# Patient Record
Sex: Female | Born: 1950
Health system: Southern US, Community
[De-identification: ages and names within clinical notes are randomized; demographics above are authoritative.]

## PROBLEM LIST (undated history)

## (undated) DIAGNOSIS — J45909 Unspecified asthma, uncomplicated: Secondary | ICD-10-CM

## (undated) DIAGNOSIS — Z9109 Other allergy status, other than to drugs and biological substances: Secondary | ICD-10-CM

## (undated) DIAGNOSIS — D75839 Thrombocytosis, unspecified: Secondary | ICD-10-CM

## (undated) DIAGNOSIS — E559 Vitamin D deficiency, unspecified: Secondary | ICD-10-CM

## (undated) DIAGNOSIS — R519 Headache, unspecified: Secondary | ICD-10-CM

## (undated) DIAGNOSIS — M1990 Unspecified osteoarthritis, unspecified site: Secondary | ICD-10-CM

## (undated) DIAGNOSIS — I10 Essential (primary) hypertension: Secondary | ICD-10-CM

## (undated) DIAGNOSIS — G4730 Sleep apnea, unspecified: Secondary | ICD-10-CM

## (undated) DIAGNOSIS — K5790 Diverticulosis of intestine, part unspecified, without perforation or abscess without bleeding: Secondary | ICD-10-CM

## (undated) DIAGNOSIS — I509 Heart failure, unspecified: Secondary | ICD-10-CM

## (undated) DIAGNOSIS — I639 Cerebral infarction, unspecified: Secondary | ICD-10-CM

## (undated) DIAGNOSIS — R739 Hyperglycemia, unspecified: Secondary | ICD-10-CM

## (undated) DIAGNOSIS — M810 Age-related osteoporosis without current pathological fracture: Secondary | ICD-10-CM

## (undated) DIAGNOSIS — G2581 Restless legs syndrome: Secondary | ICD-10-CM

## (undated) DIAGNOSIS — E785 Hyperlipidemia, unspecified: Secondary | ICD-10-CM

## (undated) DIAGNOSIS — G709 Myoneural disorder, unspecified: Secondary | ICD-10-CM

## (undated) DIAGNOSIS — D72829 Elevated white blood cell count, unspecified: Secondary | ICD-10-CM

## (undated) DIAGNOSIS — D649 Anemia, unspecified: Secondary | ICD-10-CM

## (undated) DIAGNOSIS — R32 Unspecified urinary incontinence: Secondary | ICD-10-CM

## (undated) DIAGNOSIS — H25019 Cortical age-related cataract, unspecified eye: Secondary | ICD-10-CM

## (undated) DIAGNOSIS — R51 Headache: Secondary | ICD-10-CM

## (undated) DIAGNOSIS — G8929 Other chronic pain: Secondary | ICD-10-CM

## (undated) DIAGNOSIS — N189 Chronic kidney disease, unspecified: Secondary | ICD-10-CM

## (undated) DIAGNOSIS — F419 Anxiety disorder, unspecified: Secondary | ICD-10-CM

## (undated) DIAGNOSIS — E7800 Pure hypercholesterolemia, unspecified: Secondary | ICD-10-CM

## (undated) DIAGNOSIS — I209 Angina pectoris, unspecified: Secondary | ICD-10-CM

## (undated) DIAGNOSIS — D473 Essential (hemorrhagic) thrombocythemia: Secondary | ICD-10-CM

## (undated) DIAGNOSIS — E669 Obesity, unspecified: Secondary | ICD-10-CM

## (undated) DIAGNOSIS — Z972 Presence of dental prosthetic device (complete) (partial): Secondary | ICD-10-CM

## (undated) DIAGNOSIS — R011 Cardiac murmur, unspecified: Secondary | ICD-10-CM

## (undated) DIAGNOSIS — R002 Palpitations: Secondary | ICD-10-CM

## (undated) DIAGNOSIS — T7840XA Allergy, unspecified, initial encounter: Secondary | ICD-10-CM

## (undated) DIAGNOSIS — F329 Major depressive disorder, single episode, unspecified: Secondary | ICD-10-CM

## (undated) DIAGNOSIS — I872 Venous insufficiency (chronic) (peripheral): Secondary | ICD-10-CM

## (undated) DIAGNOSIS — I5030 Unspecified diastolic (congestive) heart failure: Secondary | ICD-10-CM

## (undated) DIAGNOSIS — K219 Gastro-esophageal reflux disease without esophagitis: Secondary | ICD-10-CM

## (undated) HISTORY — DX: Chronic kidney disease, unspecified: N18.9

## (undated) HISTORY — DX: Other allergy status, other than to drugs and biological substances: Z91.09

## (undated) HISTORY — DX: Cardiac murmur, unspecified: R01.1

## (undated) HISTORY — DX: Elevated white blood cell count, unspecified: D72.829

## (undated) HISTORY — PX: BLADDER SURGERY: SHX569

## (undated) HISTORY — PX: OTHER SURGICAL HISTORY: SHX169

## (undated) HISTORY — PX: FLEXIBLE SIGMOIDOSCOPY: SHX1649

## (undated) HISTORY — DX: Thrombocytosis, unspecified: D75.839

## (undated) HISTORY — DX: Diverticulosis of intestine, part unspecified, without perforation or abscess without bleeding: K57.90

## (undated) HISTORY — PX: COLON SURGERY: SHX602

## (undated) HISTORY — DX: Essential (primary) hypertension: I10

## (undated) HISTORY — DX: Anemia, unspecified: D64.9

## (undated) HISTORY — DX: Allergy, unspecified, initial encounter: T78.40XA

## (undated) HISTORY — DX: Age-related osteoporosis without current pathological fracture: M81.0

## (undated) HISTORY — DX: Palpitations: R00.2

## (undated) HISTORY — PX: JOINT REPLACEMENT: SHX530

## (undated) HISTORY — PX: ROTATOR CUFF REPAIR: SHX139

## (undated) HISTORY — DX: Restless legs syndrome: G25.81

## (undated) HISTORY — PX: CARDIAC CATHETERIZATION: SHX172

## (undated) HISTORY — DX: Gastro-esophageal reflux disease without esophagitis: K21.9

## (undated) HISTORY — PX: RECTOCELE REPAIR: SHX761

## (undated) HISTORY — DX: Hyperglycemia, unspecified: R73.9

## (undated) HISTORY — DX: Headache, unspecified: R51.9

## (undated) HISTORY — DX: Essential (hemorrhagic) thrombocythemia: D47.3

## (undated) HISTORY — DX: Unspecified diastolic (congestive) heart failure: I50.30

## (undated) HISTORY — DX: Other chronic pain: G89.29

## (undated) HISTORY — DX: Major depressive disorder, single episode, unspecified: F32.9

## (undated) HISTORY — PX: EYE SURGERY: SHX253

## (undated) HISTORY — DX: Unspecified osteoarthritis, unspecified site: M19.90

## (undated) HISTORY — DX: Pure hypercholesterolemia, unspecified: E78.00

## (undated) HISTORY — DX: Myoneural disorder, unspecified: G70.9

## (undated) HISTORY — DX: Unspecified urinary incontinence: R32

## (undated) HISTORY — DX: Headache: R51

## (undated) HISTORY — DX: Unspecified asthma, uncomplicated: J45.909

## (undated) HISTORY — PX: CHOLECYSTECTOMY: SHX55

## (undated) HISTORY — PX: CERVICAL CONE BIOPSY: SUR198

---
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
## 1972-08-16 HISTORY — PX: VAGINAL HYSTERECTOMY: SUR661

## 2003-08-17 HISTORY — PX: BREAST CYST ASPIRATION: SHX578

## 2004-09-20 ENCOUNTER — Emergency Department: Payer: Self-pay | Admitting: Emergency Medicine

## 2004-09-29 ENCOUNTER — Ambulatory Visit: Payer: Self-pay | Admitting: Unknown Physician Specialty

## 2004-10-15 ENCOUNTER — Ambulatory Visit: Payer: Self-pay | Admitting: Internal Medicine

## 2004-11-19 ENCOUNTER — Ambulatory Visit: Payer: Self-pay | Admitting: Internal Medicine

## 2004-11-26 ENCOUNTER — Ambulatory Visit: Payer: Self-pay | Admitting: Orthopaedic Surgery

## 2005-01-14 ENCOUNTER — Ambulatory Visit: Payer: Self-pay | Admitting: Internal Medicine

## 2005-04-27 ENCOUNTER — Ambulatory Visit: Payer: Self-pay | Admitting: Internal Medicine

## 2005-05-13 ENCOUNTER — Ambulatory Visit: Payer: Self-pay | Admitting: Internal Medicine

## 2005-09-18 ENCOUNTER — Ambulatory Visit: Payer: Self-pay | Admitting: Orthopaedic Surgery

## 2005-10-20 ENCOUNTER — Ambulatory Visit: Payer: Self-pay | Admitting: Unknown Physician Specialty

## 2005-11-10 ENCOUNTER — Other Ambulatory Visit: Payer: Self-pay

## 2005-11-17 ENCOUNTER — Ambulatory Visit: Payer: Self-pay | Admitting: Orthopaedic Surgery

## 2005-11-17 HISTORY — PX: SHOULDER SURGERY: SHX246

## 2005-12-08 ENCOUNTER — Ambulatory Visit: Payer: Self-pay | Admitting: Unknown Physician Specialty

## 2006-04-28 ENCOUNTER — Ambulatory Visit: Payer: Self-pay | Admitting: Internal Medicine

## 2006-09-20 ENCOUNTER — Encounter: Payer: Self-pay | Admitting: Psychiatry

## 2006-10-15 ENCOUNTER — Encounter: Payer: Self-pay | Admitting: Psychiatry

## 2007-05-18 ENCOUNTER — Ambulatory Visit: Payer: Self-pay | Admitting: Internal Medicine

## 2007-09-14 ENCOUNTER — Ambulatory Visit: Payer: Self-pay | Admitting: Internal Medicine

## 2007-10-17 ENCOUNTER — Encounter: Admission: RE | Admit: 2007-10-17 | Discharge: 2007-10-17 | Payer: Self-pay | Admitting: Specialist

## 2008-02-02 ENCOUNTER — Ambulatory Visit: Payer: Self-pay | Admitting: Orthopedic Surgery

## 2008-02-05 ENCOUNTER — Inpatient Hospital Stay: Payer: Self-pay | Admitting: Orthopedic Surgery

## 2008-03-19 ENCOUNTER — Ambulatory Visit: Payer: Self-pay | Admitting: Orthopedic Surgery

## 2008-04-30 ENCOUNTER — Emergency Department: Payer: Self-pay | Admitting: Emergency Medicine

## 2008-05-01 ENCOUNTER — Ambulatory Visit: Payer: Self-pay | Admitting: Internal Medicine

## 2008-05-20 ENCOUNTER — Ambulatory Visit: Payer: Self-pay | Admitting: General Surgery

## 2008-05-20 ENCOUNTER — Ambulatory Visit: Payer: Self-pay | Admitting: Internal Medicine

## 2008-05-23 ENCOUNTER — Ambulatory Visit: Payer: Self-pay | Admitting: General Surgery

## 2008-08-08 ENCOUNTER — Ambulatory Visit: Payer: Self-pay | Admitting: Internal Medicine

## 2008-08-08 ENCOUNTER — Ambulatory Visit: Payer: Self-pay | Admitting: Orthopedic Surgery

## 2008-08-13 ENCOUNTER — Ambulatory Visit: Payer: Self-pay | Admitting: Orthopedic Surgery

## 2008-08-13 HISTORY — PX: KNEE ARTHROSCOPY: SUR90

## 2008-10-28 ENCOUNTER — Ambulatory Visit: Payer: Self-pay | Admitting: Orthopedic Surgery

## 2009-01-03 ENCOUNTER — Ambulatory Visit: Payer: Self-pay | Admitting: Psychiatry

## 2009-01-13 ENCOUNTER — Ambulatory Visit: Payer: Self-pay | Admitting: Internal Medicine

## 2009-01-27 ENCOUNTER — Ambulatory Visit: Payer: Self-pay | Admitting: Gastroenterology

## 2009-02-04 ENCOUNTER — Ambulatory Visit: Payer: Self-pay | Admitting: Orthopedic Surgery

## 2009-02-10 ENCOUNTER — Inpatient Hospital Stay: Payer: Self-pay | Admitting: Orthopedic Surgery

## 2009-02-24 ENCOUNTER — Ambulatory Visit: Payer: Self-pay | Admitting: Gastroenterology

## 2009-03-26 ENCOUNTER — Ambulatory Visit: Payer: Self-pay | Admitting: Internal Medicine

## 2009-04-16 ENCOUNTER — Ambulatory Visit: Payer: Self-pay

## 2009-04-16 ENCOUNTER — Ambulatory Visit: Payer: Self-pay | Admitting: Internal Medicine

## 2009-05-21 ENCOUNTER — Ambulatory Visit: Payer: Self-pay | Admitting: Internal Medicine

## 2009-06-16 ENCOUNTER — Ambulatory Visit: Payer: Self-pay | Admitting: Internal Medicine

## 2009-07-16 ENCOUNTER — Ambulatory Visit: Payer: Self-pay | Admitting: Internal Medicine

## 2009-08-16 ENCOUNTER — Ambulatory Visit: Payer: Self-pay | Admitting: Internal Medicine

## 2009-09-16 ENCOUNTER — Ambulatory Visit: Payer: Self-pay | Admitting: Internal Medicine

## 2010-06-10 ENCOUNTER — Ambulatory Visit: Payer: Self-pay | Admitting: Internal Medicine

## 2010-07-20 ENCOUNTER — Ambulatory Visit: Payer: Self-pay

## 2010-12-19 ENCOUNTER — Ambulatory Visit: Payer: Self-pay

## 2010-12-31 ENCOUNTER — Ambulatory Visit: Payer: Self-pay | Admitting: Orthopedic Surgery

## 2011-04-20 ENCOUNTER — Ambulatory Visit: Payer: Self-pay | Admitting: Internal Medicine

## 2011-06-28 ENCOUNTER — Ambulatory Visit: Payer: Self-pay | Admitting: Internal Medicine

## 2011-08-02 ENCOUNTER — Ambulatory Visit: Payer: Self-pay | Admitting: Rheumatology

## 2011-10-26 ENCOUNTER — Ambulatory Visit: Payer: Self-pay | Admitting: Gastroenterology

## 2011-12-07 ENCOUNTER — Ambulatory Visit: Payer: Self-pay | Admitting: Gastroenterology

## 2011-12-09 LAB — PATHOLOGY REPORT

## 2012-02-14 ENCOUNTER — Ambulatory Visit: Payer: Self-pay | Admitting: Internal Medicine

## 2012-05-06 ENCOUNTER — Ambulatory Visit: Payer: Self-pay | Admitting: Physical Medicine and Rehabilitation

## 2012-08-21 ENCOUNTER — Encounter: Payer: Self-pay | Admitting: Internal Medicine

## 2012-08-21 ENCOUNTER — Ambulatory Visit (INDEPENDENT_AMBULATORY_CARE_PROVIDER_SITE_OTHER): Payer: BC Managed Care – PPO | Admitting: Internal Medicine

## 2012-08-21 VITALS — BP 116/70 | HR 68 | Temp 98.0°F | Ht 65.0 in | Wt 232.0 lb

## 2012-08-21 DIAGNOSIS — I10 Essential (primary) hypertension: Secondary | ICD-10-CM

## 2012-08-21 DIAGNOSIS — E782 Mixed hyperlipidemia: Secondary | ICD-10-CM | POA: Insufficient documentation

## 2012-08-21 DIAGNOSIS — R739 Hyperglycemia, unspecified: Secondary | ICD-10-CM | POA: Insufficient documentation

## 2012-08-21 DIAGNOSIS — I872 Venous insufficiency (chronic) (peripheral): Secondary | ICD-10-CM | POA: Insufficient documentation

## 2012-08-21 DIAGNOSIS — M1990 Unspecified osteoarthritis, unspecified site: Secondary | ICD-10-CM | POA: Insufficient documentation

## 2012-08-21 DIAGNOSIS — D473 Essential (hemorrhagic) thrombocythemia: Secondary | ICD-10-CM

## 2012-08-21 DIAGNOSIS — E7800 Pure hypercholesterolemia, unspecified: Secondary | ICD-10-CM

## 2012-08-21 DIAGNOSIS — R002 Palpitations: Secondary | ICD-10-CM

## 2012-08-21 DIAGNOSIS — D649 Anemia, unspecified: Secondary | ICD-10-CM

## 2012-08-21 DIAGNOSIS — K219 Gastro-esophageal reflux disease without esophagitis: Secondary | ICD-10-CM

## 2012-08-21 DIAGNOSIS — R51 Headache: Secondary | ICD-10-CM

## 2012-08-21 DIAGNOSIS — R7309 Other abnormal glucose: Secondary | ICD-10-CM

## 2012-08-21 MED ORDER — CITALOPRAM HYDROBROMIDE 20 MG PO TABS: ORAL_TABLET | ORAL | Status: DC

## 2012-08-21 MED ORDER — ESOMEPRAZOLE MAGNESIUM 40 MG PO CPDR: 40.0000 mg | DELAYED_RELEASE_CAPSULE | Freq: Every day | ORAL | Status: DC

## 2012-08-27 ENCOUNTER — Encounter: Payer: Self-pay | Admitting: Internal Medicine

## 2012-08-27 NOTE — Assessment & Plan Note (Signed)
On topamax.  Headaches better.  Has declined a follow up appt with Dr Neale Burly.

## 2012-08-27 NOTE — Assessment & Plan Note (Signed)
Saw Dr Pandit.  Follow cbc.  

## 2012-08-27 NOTE — Assessment & Plan Note (Signed)
Increased reflux as outlined.  Could be contributing to the above.  Will change back to nexium.  Refer to GI for evaluation of persistent symptoms despite medication.

## 2012-08-27 NOTE — Progress Notes (Signed)
Subjective:    Patient ID: Nicole Sims, female    DOB: 11-30-1950, 62 y.o.   MRN: [id number]  HPI 62 year old female with past history of depression requiring hospitalization x 2.  She also has known reflux, IBS, tachycardia, hypercholesterolemia and headaches.  She comes in today for a scheduled follow up.  She states she saw Dr Hyacinth Meeker on 03/20/12 for increased heart rate and elevated blood pressure.  Metoprolol was restarted.  She still notices some increased heart rate at times.  Has not seen cardiology recently.  No chest pain.  Breathing stable.  Seeing Dr Yves Dill.  Had spinal injections.  Off Cymbalta.  Taking oxycodone prn.  States she may average 2x/week.  Dr Gavin Potters referred her to Dr Yves Dill.  Has seen Dr Sherrlyn Hock also.  Did not tolerate the iron.  She is on Protonix now.  Wants to change back to Nexium.  Feels the Nexium worked better for her.  With increased acid reflux.  Not taking welchol.  Ran out.    Past Medical History  Diagnosis Date  . Hypertension   . Hypercholesterolemia   . Palpitations   . Depression   . GERD (gastroesophageal reflux disease)   . Environmental allergies   . Urinary incontinence     mixed  . Osteoarthritis   . Diverticulosis   . Anemia   . Thrombocytosis   . Chronic headaches   . Hyperglycemia   . Asthma     Current Outpatient Prescriptions on File Prior to Visit  Medication Sig Dispense Refill  . albuterol (PROVENTIL HFA;VENTOLIN HFA) 108 (90 BASE) MCG/ACT inhaler Inhale 2 puffs into the lungs every 4 (four) hours as needed.      . calcium gluconate 500 MG tablet Take 500 mg by mouth 2 (two) times daily.      . fluticasone (FLONASE) 50 MCG/ACT nasal spray Place 2 sprays into the nose as needed.       . Fluticasone-Salmeterol (ADVAIR) 250-50 MCG/DOSE AEPB Inhale 1 puff into the lungs every 12 (twelve) hours.      . metoprolol succinate (TOPROL-XL) 25 MG 24 hr tablet Take 25 mg by mouth daily.      Marland Kitchen esomeprazole (NEXIUM) 40 MG capsule Take 1  capsule (40 mg total) by mouth daily before breakfast.  30 capsule  3    Review of Systems Patient denies any headache, lightheadedness or dizziness.  No significant sinus or allergy symptoms now.  No chest pain, tightness, but does report some increased heart rate at times.  taking the Metoprolol.   No increased shortness of breath, cough or congestion.  No nausea or vomiting.  Increased acid reflux as outlined.   No abdominal pain or cramping.  No bowel change, such as diarrhea, constipation, BRBPR or melana.  No urine change.        Objective:   Physical Exam Filed Vitals:   08/21/12 1544  BP: 116/70  Pulse: 68  Temp: 98 F (36.7 C)   Blood pressure recheck:  132.82, pulse 59  62 year old female in no acute distress.   HEENT:  Nares - clear.  OP- without lesions or erythema.  NECK:  Supple, nontender.  No audible bruit.   HEART:  Appears to be regular.  Rate controlled.   LUNGS:  Without crackles or wheezing audible.  Respirations even and unlabored.   RADIAL PULSE:  Equal bilaterally.  ABDOMEN:  Soft, nontender.  No audible abdominal bruit.   EXTREMITIES:  No increased edema  to be present.                  Assessment & Plan:  PULMONARY.  Breathing stable.  Follow.    INCREASED PSYCHOSOCIAL STRESSORS.  On celexa.  Relatively stable.  Hold on making any changes.  Follow.    HEALTH MAINTENANCE.  Will schedule physical at next visit.  Last 09/06/11.  Colonoscopy 01/27/09 - diverticulosis otherwise normal.  Obtain results of last mammogram.

## 2012-08-27 NOTE — Assessment & Plan Note (Signed)
Low carb diet, exercise and weight loss.  Follow.  Check metabolic panel and a1c.

## 2012-08-27 NOTE — Assessment & Plan Note (Signed)
Was evaluated by Dr Sherrlyn Hock.  Spleen ok.  Recheck cbc with next labs.

## 2012-08-27 NOTE — Assessment & Plan Note (Signed)
Blood pressure appears to be under control.  Same medication.  Follow.

## 2012-08-27 NOTE — Assessment & Plan Note (Signed)
Low cholesterol diet and exercise.  Does not tolerate statins.  Will hold on Welchol until GI issues straightened out.  Check lipid panel.

## 2012-08-27 NOTE — Assessment & Plan Note (Signed)
Has noticed increased heart rate intermittently.  EKG obtained and revealed SR with no acute ischemic changes.  Follow.  Continue metoprolol.  Treat acid reflux.  Follow.

## 2012-08-28 ENCOUNTER — Other Ambulatory Visit (INDEPENDENT_AMBULATORY_CARE_PROVIDER_SITE_OTHER): Payer: BC Managed Care – PPO

## 2012-08-28 DIAGNOSIS — D473 Essential (hemorrhagic) thrombocythemia: Secondary | ICD-10-CM

## 2012-08-28 DIAGNOSIS — R739 Hyperglycemia, unspecified: Secondary | ICD-10-CM

## 2012-08-28 DIAGNOSIS — D649 Anemia, unspecified: Secondary | ICD-10-CM

## 2012-08-28 DIAGNOSIS — I10 Essential (primary) hypertension: Secondary | ICD-10-CM

## 2012-08-28 DIAGNOSIS — R002 Palpitations: Secondary | ICD-10-CM

## 2012-08-28 DIAGNOSIS — R7309 Other abnormal glucose: Secondary | ICD-10-CM

## 2012-08-28 DIAGNOSIS — E7800 Pure hypercholesterolemia, unspecified: Secondary | ICD-10-CM

## 2012-08-28 LAB — CBC WITH DIFFERENTIAL/PLATELET
Basophils Relative: 0.5 % (ref 0.0–3.0)
Eosinophils Absolute: 0.1 10*3/uL (ref 0.0–0.7)
Eosinophils Relative: 1.7 % (ref 0.0–5.0)
HCT: 40.5 % (ref 36.0–46.0)
Lymphs Abs: 1.9 10*3/uL (ref 0.7–4.0)
MCHC: 32.4 g/dL (ref 30.0–36.0)
MCV: 86 fl (ref 78.0–100.0)
Monocytes Absolute: 0.6 10*3/uL (ref 0.1–1.0)
Neutro Abs: 3.3 10*3/uL (ref 1.4–7.7)
RBC: 4.71 Mil/uL (ref 3.87–5.11)
WBC: 5.9 10*3/uL (ref 4.5–10.5)

## 2012-08-28 LAB — LIPID PANEL
Cholesterol: 198 mg/dL (ref 0–200)
VLDL: 21.2 mg/dL (ref 0.0–40.0)

## 2012-08-28 LAB — HEPATIC FUNCTION PANEL
ALT: 24 U/L (ref 0–35)
Alkaline Phosphatase: 78 U/L (ref 39–117)
Bilirubin, Direct: 0 mg/dL (ref 0.0–0.3)
Total Bilirubin: 0.5 mg/dL (ref 0.3–1.2)

## 2012-08-28 LAB — BASIC METABOLIC PANEL
BUN: 8 mg/dL (ref 6–23)
Calcium: 9.1 mg/dL (ref 8.4–10.5)
Creatinine, Ser: 0.9 mg/dL (ref 0.4–1.2)
GFR: 68.23 mL/min (ref >60.00)
Glucose, Bld: 96 mg/dL (ref 70–99)

## 2012-08-28 LAB — TSH: TSH: 1.16 u[IU]/mL (ref 0.35–5.50)

## 2012-08-29 ENCOUNTER — Telehealth: Payer: Self-pay | Admitting: Internal Medicine

## 2012-08-29 DIAGNOSIS — D473 Essential (hemorrhagic) thrombocythemia: Secondary | ICD-10-CM

## 2012-08-29 DIAGNOSIS — I10 Essential (primary) hypertension: Secondary | ICD-10-CM

## 2012-08-29 DIAGNOSIS — R739 Hyperglycemia, unspecified: Secondary | ICD-10-CM

## 2012-08-29 DIAGNOSIS — E7800 Pure hypercholesterolemia, unspecified: Secondary | ICD-10-CM

## 2012-08-29 NOTE — Telephone Encounter (Signed)
Scheduled

## 2012-08-29 NOTE — Telephone Encounter (Signed)
Pt notified of lab results via my chart.  Needs follow up fasting labs 1-2 days prior to her 11/27/12 appt.  Please call and schedule.  Thanks.

## 2012-09-30 ENCOUNTER — Other Ambulatory Visit: Payer: Self-pay

## 2012-10-14 ENCOUNTER — Other Ambulatory Visit: Payer: Self-pay | Admitting: Internal Medicine

## 2012-11-16 ENCOUNTER — Encounter: Payer: Self-pay | Admitting: *Deleted

## 2012-11-17 ENCOUNTER — Telehealth: Payer: Self-pay | Admitting: *Deleted

## 2012-11-17 MED ORDER — METOPROLOL SUCCINATE ER 25 MG PO TB24: ORAL_TABLET | ORAL | Status: DC

## 2012-11-17 NOTE — Telephone Encounter (Signed)
Pharmacy called with rx request

## 2012-11-20 ENCOUNTER — Other Ambulatory Visit (INDEPENDENT_AMBULATORY_CARE_PROVIDER_SITE_OTHER): Payer: BC Managed Care – PPO

## 2012-11-20 DIAGNOSIS — R739 Hyperglycemia, unspecified: Secondary | ICD-10-CM

## 2012-11-20 DIAGNOSIS — I10 Essential (primary) hypertension: Secondary | ICD-10-CM

## 2012-11-20 DIAGNOSIS — D473 Essential (hemorrhagic) thrombocythemia: Secondary | ICD-10-CM

## 2012-11-20 DIAGNOSIS — E7800 Pure hypercholesterolemia, unspecified: Secondary | ICD-10-CM

## 2012-11-20 DIAGNOSIS — R7309 Other abnormal glucose: Secondary | ICD-10-CM

## 2012-11-20 LAB — CBC WITH DIFFERENTIAL/PLATELET
Basophils Absolute: 0.1 10*3/uL (ref 0.0–0.1)
Hemoglobin: 13 g/dL (ref 12.0–15.0)
Lymphocytes Relative: 28.2 % (ref 12.0–46.0)
Monocytes Relative: 7.3 % (ref 3.0–12.0)
Neutro Abs: 5.3 10*3/uL (ref 1.4–7.7)
Neutrophils Relative %: 62 % (ref 43.0–77.0)
RDW: 14.1 % (ref 11.5–14.6)

## 2012-11-20 LAB — LIPID PANEL
Cholesterol: 239 mg/dL — ABNORMAL HIGH (ref 0–200)
HDL: 43.5 mg/dL (ref >39.00)
Triglycerides: 119 mg/dL (ref 0.0–149.0)
VLDL: 23.8 mg/dL (ref 0.0–40.0)

## 2012-11-20 LAB — BASIC METABOLIC PANEL
Calcium: 9.3 mg/dL (ref 8.4–10.5)
Creatinine, Ser: 1 mg/dL (ref 0.4–1.2)
GFR: 59.6 mL/min — ABNORMAL LOW (ref >60.00)
Glucose, Bld: 100 mg/dL — ABNORMAL HIGH (ref 70–99)
Sodium: 141 mEq/L (ref 135–145)

## 2012-11-20 LAB — LDL CHOLESTEROL, DIRECT: Direct LDL: 164.5 mg/dL

## 2012-11-21 ENCOUNTER — Telehealth: Payer: Self-pay | Admitting: Internal Medicine

## 2012-11-21 NOTE — Telephone Encounter (Signed)
Notified of labs via my chart 

## 2012-11-27 ENCOUNTER — Other Ambulatory Visit (HOSPITAL_COMMUNITY)
Admission: RE | Admit: 2012-11-27 | Discharge: 2012-11-27 | Disposition: A | Discharge status: Home / Self Care | Payer: BC Managed Care – PPO | Source: Ambulatory Visit | Attending: Internal Medicine | Admitting: Internal Medicine

## 2012-11-27 ENCOUNTER — Encounter: Payer: Self-pay | Admitting: Internal Medicine

## 2012-11-27 ENCOUNTER — Ambulatory Visit (INDEPENDENT_AMBULATORY_CARE_PROVIDER_SITE_OTHER): Payer: BC Managed Care – PPO | Admitting: Internal Medicine

## 2012-11-27 VITALS — BP 120/80 | HR 86 | Temp 98.5°F | Ht 64.0 in | Wt 231.8 lb

## 2012-11-27 DIAGNOSIS — E7800 Pure hypercholesterolemia, unspecified: Secondary | ICD-10-CM

## 2012-11-27 DIAGNOSIS — D473 Essential (hemorrhagic) thrombocythemia: Secondary | ICD-10-CM

## 2012-11-27 DIAGNOSIS — M1990 Unspecified osteoarthritis, unspecified site: Secondary | ICD-10-CM

## 2012-11-27 DIAGNOSIS — R7309 Other abnormal glucose: Secondary | ICD-10-CM

## 2012-11-27 DIAGNOSIS — Z124 Encounter for screening for malignant neoplasm of cervix: Secondary | ICD-10-CM

## 2012-11-27 DIAGNOSIS — Z1151 Encounter for screening for human papillomavirus (HPV): Secondary | ICD-10-CM | POA: Insufficient documentation

## 2012-11-27 DIAGNOSIS — D649 Anemia, unspecified: Secondary | ICD-10-CM

## 2012-11-27 DIAGNOSIS — Z1239 Encounter for other screening for malignant neoplasm of breast: Secondary | ICD-10-CM

## 2012-11-27 DIAGNOSIS — R002 Palpitations: Secondary | ICD-10-CM

## 2012-11-27 DIAGNOSIS — Z01419 Encounter for gynecological examination (general) (routine) without abnormal findings: Secondary | ICD-10-CM | POA: Insufficient documentation

## 2012-11-27 DIAGNOSIS — I10 Essential (primary) hypertension: Secondary | ICD-10-CM

## 2012-11-27 DIAGNOSIS — K219 Gastro-esophageal reflux disease without esophagitis: Secondary | ICD-10-CM

## 2012-11-27 DIAGNOSIS — R51 Headache: Secondary | ICD-10-CM

## 2012-11-27 DIAGNOSIS — R739 Hyperglycemia, unspecified: Secondary | ICD-10-CM

## 2012-11-27 MED ORDER — FLUTICASONE-SALMETEROL 250-50 MCG/DOSE IN AEPB: 1.0000 | INHALATION_SPRAY | Freq: Two times a day (BID) | RESPIRATORY_TRACT | Status: DC

## 2012-11-27 MED ORDER — ESOMEPRAZOLE MAGNESIUM 40 MG PO CPDR: 40.0000 mg | DELAYED_RELEASE_CAPSULE | Freq: Every day | ORAL | Status: DC

## 2012-11-28 ENCOUNTER — Encounter: Payer: Self-pay | Admitting: Internal Medicine

## 2012-11-28 NOTE — Assessment & Plan Note (Addendum)
Saw Dr Pandit.  Follow cbc.  Recent hgb normal.   

## 2012-11-28 NOTE — Assessment & Plan Note (Signed)
Low cholesterol diet and exercise.  Does not tolerate statins.  Will restart Welchol.  Follow lipid panel.

## 2012-11-28 NOTE — Assessment & Plan Note (Signed)
Low carb diet, exercise and weight loss.  Follow.  Follow metabolic panel and a1c.  Last a1c normal.

## 2012-11-28 NOTE — Assessment & Plan Note (Signed)
On topamax.  Headaches better.  Has declined a follow up appt with Dr Neale Burly.

## 2012-11-28 NOTE — Progress Notes (Signed)
Subjective:    Patient ID: Nicole Sims, female    DOB: 05/29/1951, 62 y.o.   MRN: [id number]  Urinary Frequency  Associated symptoms include frequency.  62 year old female with past history of depression requiring hospitalization x 2.  She also has known reflux, IBS, tachycardia, hypercholesterolemia and headaches.  She comes in today to follow up on these issues as well as for a complete physical exam.  She reports that for two weeks she has had some increased congestion and cough.  Right side nose congested.  Increased drainage.  Neck hurting.  Increased cough - productive of green mucus.  Has started CF tussin and Flonase.  Not using Advair.  Has noticed some wheezing.  Her reflux is better.  She is not taking Nexium in the am and Zantac in the pm.  She is also trying to avoid foods that aggravate.  Discussed increased cholesterol.  Intolerant of statins.  Thought Wellchol caused some nausea.  Willing to retry.  Bowels stable.  No increased abdominal pain.      Past Medical History  Diagnosis Date  . Hypertension   . Hypercholesterolemia   . Palpitations   . Depression   . GERD (gastroesophageal reflux disease)   . Environmental allergies   . Urinary incontinence     mixed  . Osteoarthritis   . Diverticulosis   . Anemia   . Thrombocytosis   . Chronic headaches   . Hyperglycemia   . Asthma     Current Outpatient Prescriptions on File Prior to Visit  Medication Sig Dispense Refill  . albuterol (PROVENTIL HFA;VENTOLIN HFA) 108 (90 BASE) MCG/ACT inhaler Inhale 2 puffs into the lungs every 4 (four) hours as needed.      Marland Kitchen aspirin 81 MG tablet Take 81 mg by mouth daily.      . calcium gluconate 500 MG tablet Take 500 mg by mouth 2 (two) times daily.      . citalopram (CELEXA) 20 MG tablet Take 1 1/2 tablet by mouth daily  45 tablet  3  . cyclobenzaprine (FLEXERIL) 10 MG tablet Take 10 mg by mouth 3 (three) times daily as needed.      . fish oil-omega-3 fatty acids 1000 MG capsule  Take 1 g by mouth 2 (two) times daily.      . fluticasone (FLONASE) 50 MCG/ACT nasal spray Place 2 sprays into the nose as needed.       . metoprolol succinate (TOPROL-XL) 25 MG 24 hr tablet TAKE 1 TABLET BY MOUTH EVERY DAY  30 tablet  0  . Multiple Vitamin (MULTIVITAMIN) tablet Take 1 tablet by mouth daily.      Marland Kitchen oxyCODONE (OXY IR/ROXICODONE) 5 MG immediate release tablet Take 5 mg by mouth 2 (two) times daily as needed.       No current facility-administered medications on file prior to visit.    Review of Systems  Genitourinary: Positive for frequency.  Patient denies any headache, lightheadedness or dizziness.  Reports some increased nasal congestion and cough as outlined.  No chest pain, tightness.  No increased palpitations reported.  Taking the Metoprolol. No increased shortness of breath.  Does report some wheezing.   No nausea or vomiting.  Acid reflux improved.  No abdominal pain or cramping.  No bowel change, such as diarrhea, constipation, BRBPR or melana.  No significant change in her urine reported to me.       Objective:   Physical Exam  Filed Vitals:   11/27/12  1321  BP: 120/80  Pulse: 86  Temp: 98.5 F (36.9 C)   Blood pressure recheck:  128/80, pulse 4  62 year old female in no acute distress.   HEENT:  Nares- clear.  Oropharynx - without lesions. NECK:  Supple.  Nontender.  No audible bruit.  HEART:  Appears to be regular. LUNGS:  No crackles or wheezing audible.  Respirations even and unlabored.  RADIAL PULSE:  Equal bilaterally.    BREASTS:  No nipple discharge or nipple retraction present.  Could not appreciate any distinct nodules or axillary adenopathy.  ABDOMEN:  Soft, nontender.  Bowel sounds present and normal.  No audible abdominal bruit.  GU:  Normal external genitalia.  Vaginal vault without lesions.  S/P hysterectomy.  Pap performed. Could not appreciate any adnexal masses or tenderness.   RECTAL:  Heme negative.   EXTREMITIES:  No increased edema  present.  DP pulses palpable and equal bilaterally.          Assessment & Plan:  PULMONARY.  Increased sinus congestion and cough.  Some wheezing.  Persistent.  Will try Mucinex and Advair as directed.  Flonase and saline nasal spray as directed.  If symptoms do no improve or if they worsen, rx for Levaquin given to pt to take if needed.  Notify me if persistent problems.      INCREASED PSYCHOSOCIAL STRESSORS.  On celexa.  Relatively stable.  Hold on making any changes.  Follow.    HEALTH MAINTENANCE.    Physical today.  Colonoscopy 01/27/09 - diverticulosis otherwise normal.  Overdue mammogram.  Schedule.

## 2012-11-28 NOTE — Assessment & Plan Note (Signed)
On metoprolol. Stable.  

## 2012-11-28 NOTE — Assessment & Plan Note (Signed)
Was evaluated by Dr Pandit.  Spleen ok. Follow platelet count.   

## 2012-11-28 NOTE — Assessment & Plan Note (Signed)
Doing better on Nexium in the am and zantac in the pm.  Follow.

## 2012-11-28 NOTE — Assessment & Plan Note (Signed)
Blood pressure appears to be under control.  Same medication.  Follow.

## 2012-11-28 NOTE — Assessment & Plan Note (Signed)
Stable

## 2012-11-29 ENCOUNTER — Telehealth: Payer: Self-pay | Admitting: Internal Medicine

## 2012-11-29 NOTE — Telephone Encounter (Signed)
Received a phone call from [place] at [facility], she stated they received a prescription on the patient yesterday for Levaquin. However the Levaquin interacts with the Celexa she is currently taking. Please advise.

## 2012-11-29 NOTE — Telephone Encounter (Signed)
Spoke with pt & feels like she has taken Doxycycline in the past & tolerated it well. Phoned in Rx to [facility] in [place]

## 2012-11-29 NOTE — Telephone Encounter (Signed)
See if pt can take doxycycline.  If she can, then can call in doxycycline 100mg  bid x 10 days.  (take with food).   Thanks

## 2012-12-05 ENCOUNTER — Encounter: Payer: Self-pay | Admitting: Internal Medicine

## 2012-12-26 ENCOUNTER — Ambulatory Visit: Payer: Self-pay | Admitting: Internal Medicine

## 2012-12-28 ENCOUNTER — Encounter: Payer: Self-pay | Admitting: Internal Medicine

## 2012-12-31 ENCOUNTER — Encounter: Payer: Self-pay | Admitting: Internal Medicine

## 2013-01-01 ENCOUNTER — Other Ambulatory Visit: Payer: Self-pay | Admitting: Internal Medicine

## 2013-01-03 ENCOUNTER — Other Ambulatory Visit: Payer: Self-pay | Admitting: Internal Medicine

## 2013-01-03 NOTE — Telephone Encounter (Signed)
Eprescribed.

## 2013-01-19 ENCOUNTER — Encounter: Payer: Self-pay | Admitting: Internal Medicine

## 2013-03-19 ENCOUNTER — Encounter: Payer: Self-pay | Admitting: Internal Medicine

## 2013-03-19 DIAGNOSIS — R454 Irritability and anger: Secondary | ICD-10-CM

## 2013-03-20 NOTE — Telephone Encounter (Signed)
Heavy metal screen ordered.

## 2013-03-22 ENCOUNTER — Other Ambulatory Visit (INDEPENDENT_AMBULATORY_CARE_PROVIDER_SITE_OTHER): Payer: BC Managed Care – PPO

## 2013-03-22 DIAGNOSIS — D473 Essential (hemorrhagic) thrombocythemia: Secondary | ICD-10-CM

## 2013-03-22 DIAGNOSIS — E7800 Pure hypercholesterolemia, unspecified: Secondary | ICD-10-CM

## 2013-03-22 DIAGNOSIS — D75839 Thrombocytosis, unspecified: Secondary | ICD-10-CM

## 2013-03-22 DIAGNOSIS — I10 Essential (primary) hypertension: Secondary | ICD-10-CM

## 2013-03-22 DIAGNOSIS — R739 Hyperglycemia, unspecified: Secondary | ICD-10-CM

## 2013-03-22 DIAGNOSIS — R7309 Other abnormal glucose: Secondary | ICD-10-CM

## 2013-03-22 LAB — CBC WITH DIFFERENTIAL/PLATELET
Basophils Absolute: 0 10*3/uL (ref 0.0–0.1)
Basophils Relative: 0.5 % (ref 0.0–3.0)
HCT: 37.7 % (ref 36.0–46.0)
Hemoglobin: 12.3 g/dL (ref 12.0–15.0)
Lymphs Abs: 3 10*3/uL (ref 0.7–4.0)
MCHC: 32.5 g/dL (ref 30.0–36.0)
Monocytes Relative: 7.4 % (ref 3.0–12.0)
Neutro Abs: 4.5 10*3/uL (ref 1.4–7.7)
RBC: 4.38 Mil/uL (ref 3.87–5.11)
RDW: 13.9 % (ref 11.5–14.6)

## 2013-03-22 LAB — BASIC METABOLIC PANEL
BUN: 14 mg/dL (ref 6–23)
Calcium: 9.4 mg/dL (ref 8.4–10.5)
Creatinine, Ser: 1.1 mg/dL (ref 0.4–1.2)
GFR: 51.18 mL/min — ABNORMAL LOW (ref >60.00)

## 2013-03-22 LAB — HEPATIC FUNCTION PANEL
ALT: 22 U/L (ref 0–35)
Albumin: 3.7 g/dL (ref 3.5–5.2)
Alkaline Phosphatase: 91 U/L (ref 39–117)
Bilirubin, Direct: 0 mg/dL (ref 0.0–0.3)
Total Protein: 7.6 g/dL (ref 6.0–8.3)

## 2013-03-22 LAB — LIPID PANEL
HDL: 46.9 mg/dL (ref >39.00)
VLDL: 23 mg/dL (ref 0.0–40.0)

## 2013-03-22 LAB — LDL CHOLESTEROL, DIRECT: Direct LDL: 158.3 mg/dL

## 2013-03-22 LAB — HEMOGLOBIN A1C: Hgb A1c MFr Bld: 6 % (ref 4.6–6.5)

## 2013-03-26 ENCOUNTER — Encounter: Payer: Self-pay | Admitting: Internal Medicine

## 2013-03-26 ENCOUNTER — Ambulatory Visit (INDEPENDENT_AMBULATORY_CARE_PROVIDER_SITE_OTHER): Payer: BC Managed Care – PPO | Admitting: Internal Medicine

## 2013-03-26 VITALS — BP 90/70 | HR 86 | Temp 98.2°F | Ht 64.0 in | Wt 232.2 lb

## 2013-03-26 DIAGNOSIS — D473 Essential (hemorrhagic) thrombocythemia: Secondary | ICD-10-CM

## 2013-03-26 DIAGNOSIS — R51 Headache: Secondary | ICD-10-CM

## 2013-03-26 DIAGNOSIS — M1990 Unspecified osteoarthritis, unspecified site: Secondary | ICD-10-CM

## 2013-03-26 DIAGNOSIS — E7800 Pure hypercholesterolemia, unspecified: Secondary | ICD-10-CM

## 2013-03-26 DIAGNOSIS — R7309 Other abnormal glucose: Secondary | ICD-10-CM

## 2013-03-26 DIAGNOSIS — R002 Palpitations: Secondary | ICD-10-CM

## 2013-03-26 DIAGNOSIS — K219 Gastro-esophageal reflux disease without esophagitis: Secondary | ICD-10-CM

## 2013-03-26 DIAGNOSIS — D649 Anemia, unspecified: Secondary | ICD-10-CM

## 2013-03-26 DIAGNOSIS — R454 Irritability and anger: Secondary | ICD-10-CM

## 2013-03-26 DIAGNOSIS — R739 Hyperglycemia, unspecified: Secondary | ICD-10-CM

## 2013-03-26 DIAGNOSIS — I10 Essential (primary) hypertension: Secondary | ICD-10-CM

## 2013-03-27 ENCOUNTER — Other Ambulatory Visit: Payer: Self-pay | Admitting: Internal Medicine

## 2013-03-27 ENCOUNTER — Encounter: Payer: Self-pay | Admitting: Internal Medicine

## 2013-03-27 ENCOUNTER — Ambulatory Visit: Payer: Self-pay | Admitting: Orthopedic Surgery

## 2013-03-27 DIAGNOSIS — F329 Major depressive disorder, single episode, unspecified: Secondary | ICD-10-CM

## 2013-03-27 NOTE — Assessment & Plan Note (Signed)
On metoprolol. Stable.  

## 2013-03-27 NOTE — Progress Notes (Signed)
Order placed for psych referral.   

## 2013-03-27 NOTE — Assessment & Plan Note (Signed)
Saw Dr Pandit.  Follow cbc.  Recent hgb normal.   

## 2013-03-27 NOTE — Assessment & Plan Note (Signed)
Low cholesterol diet and exercise.  Does not tolerate statins.  Off Welchol.  Did not tolerate.  Recent cholesterol panel revealed total cholesterol 225, triglycerides 115, HDL 47 and LDL158.   Follow lipid panel.  Discussed the need for diet and exercise.

## 2013-03-27 NOTE — Assessment & Plan Note (Signed)
Blood pressure has been under good control.  Same medication.  Follow.    

## 2013-03-27 NOTE — Assessment & Plan Note (Signed)
Low carb diet, exercise and weight loss.  Follow.  Follow metabolic panel and a1c.  Last a1c 6.0.

## 2013-03-27 NOTE — Assessment & Plan Note (Signed)
Was on topamax.  Headaches were better.  Has declined a follow up appt with Dr Neale Burly.   Off Topamax now.  Some headaches occasionally.  Will flush with saline, etc.  Treat allergies.  Follow.  May need referral back or adjustments in her meds if headaches are persistent or if they change or worsen.  Pt desires no further w/up at this time.

## 2013-03-27 NOTE — Assessment & Plan Note (Signed)
Not reported as an issues today.  Follow.  

## 2013-03-27 NOTE — Assessment & Plan Note (Signed)
Stable

## 2013-03-27 NOTE — Progress Notes (Signed)
Subjective:    Patient ID: Nicole Sims, female    DOB: [birth year], 62 y.o.   MRN: [id number]  HPI 62 year old female with past history of depression requiring hospitalization x 2.  She also has known reflux, IBS, tachycardia, hypercholesterolemia and headaches.  She comes in today for a scheduled follow up.  Has been under more stress recently.  Having some issues with her son.  This is putting more stress on her and her husband.  More emotional.  Discussed counseling/seeing psych.  She is agreeable.  She is currently taking Celexa.   No chest pain.  Breathing stable.  Seeing Dr Yves Dill.  Had spinal injections.  Off Cymbalta.  Taking oxycodone prn.  States she may average 2x/week.  Dr Gavin Potters referred her to Dr Yves Dill.  Has seen Dr Sherrlyn Hock also.  Platelet count slightly elevated.     Past Medical History  Diagnosis Date  . Hypertension   . Hypercholesterolemia   . Palpitations   . Depression   . GERD (gastroesophageal reflux disease)   . Environmental allergies   . Urinary incontinence     mixed  . Osteoarthritis   . Diverticulosis   . Anemia   . Thrombocytosis   . Chronic headaches   . Hyperglycemia   . Asthma     Current Outpatient Prescriptions on File Prior to Visit  Medication Sig Dispense Refill  . albuterol (PROVENTIL HFA;VENTOLIN HFA) 108 (90 BASE) MCG/ACT inhaler Inhale 2 puffs into the lungs every 4 (four) hours as needed.      Marland Kitchen aspirin 81 MG tablet Take 81 mg by mouth daily.      . calcium gluconate 500 MG tablet Take 500 mg by mouth 2 (two) times daily.      . citalopram (CELEXA) 20 MG tablet TAKE 1 AND 1/2 TABLETS BY MOUTH EVERY DAY  45 tablet  2  . cyclobenzaprine (FLEXERIL) 10 MG tablet Take 10 mg by mouth 3 (three) times daily as needed.      Marland Kitchen esomeprazole (NEXIUM) 40 MG capsule Take 1 capsule (40 mg total) by mouth daily before breakfast.  30 capsule  4  . fish oil-omega-3 fatty acids 1000 MG capsule Take 1 g by mouth 2 (two) times daily.      . fluticasone  (FLONASE) 50 MCG/ACT nasal spray SPRAY TWICE IN EACH NOSTRIL EVERY DAY  16 g  2  . Fluticasone-Salmeterol (ADVAIR) 250-50 MCG/DOSE AEPB Inhale 1 puff into the lungs every 12 (twelve) hours.  60 each  2  . metoprolol succinate (TOPROL-XL) 25 MG 24 hr tablet TAKE 1 TABLET BY MOUTH EVERY DAY  30 tablet  0  . Multiple Vitamin (MULTIVITAMIN) tablet Take 1 tablet by mouth daily.      Marland Kitchen oxyCODONE (OXY IR/ROXICODONE) 5 MG immediate release tablet Take 5 mg by mouth 2 (two) times daily as needed.       No current facility-administered medications on file prior to visit.    Review of Systems Patient denies any headache, lightheadedness or dizziness.  No significant sinus or allergy symptoms now.  No chest pain, tightness.  taking the Metoprolol.   No increased shortness of breath, cough or congestion.  No nausea or vomiting.  No increased acid reflux reported.   No abdominal pain or cramping.  No bowel change, such as diarrhea, constipation, BRBPR or melana.  No urine change. Increased stress.  See above.  Is not taking Welchol.  Did not tolerate.  Unable and desires not to  take statins.          Objective:   Physical Exam  Filed Vitals:   03/26/13 1445  BP: 90/70  Pulse: 86  Temp: 98.2 F (36.8 C)   Blood pressure recheck:  132/78, pulse 86  62 year old female in no acute distress.   HEENT:  Nares - clear.  OP- without lesions or erythema.  NECK:  Supple, nontender.  No audible bruit.   HEART:  Appears to be regular.  Rate controlled.   LUNGS:  Without crackles or wheezing audible.  Respirations even and unlabored.   RADIAL PULSE:  Equal bilaterally.  ABDOMEN:  Soft, nontender.  No audible abdominal bruit.   EXTREMITIES:  No increased edema to be present.                  Assessment & Plan:  PULMONARY.  Breathing stable.  Follow.    INCREASED PSYCHOSOCIAL STRESSORS.  On celexa.  More stress recently.  Discussed at length with her today.  Discussed counseling/psych referral.  Pt  agreeable.  Will refer to Dr Maryruth Bun for evaluation and treatment.     HEALTH MAINTENANCE.  Physical 11/27/12.  Colonoscopy 01/27/09 - diverticulosis otherwise normal. Mammogram 12/26/12 - Birads II.

## 2013-03-27 NOTE — Assessment & Plan Note (Signed)
Was evaluated by Dr Pandit.  Spleen ok. Follow platelet count.   

## 2013-03-28 ENCOUNTER — Other Ambulatory Visit: Payer: Self-pay | Admitting: Internal Medicine

## 2013-04-02 ENCOUNTER — Encounter: Payer: Self-pay | Admitting: Internal Medicine

## 2013-04-10 ENCOUNTER — Telehealth: Payer: Self-pay | Admitting: Internal Medicine

## 2013-04-10 NOTE — Telephone Encounter (Signed)
Patient needing a letter of medical clearance for her rotator cuff surgery on her left shoulder.

## 2013-04-11 ENCOUNTER — Telehealth: Payer: Self-pay | Admitting: Internal Medicine

## 2013-04-11 ENCOUNTER — Encounter: Payer: Self-pay | Admitting: Internal Medicine

## 2013-04-11 NOTE — Telephone Encounter (Signed)
Pt aware of appointment 

## 2013-04-11 NOTE — Telephone Encounter (Signed)
Schedule for 9:00 on 04/20/13 - block 30 minutes.  Thanks.

## 2013-04-11 NOTE — Telephone Encounter (Signed)
Pt called wanting to get an appointment for surgical clearance to have shoulder surgery. Pt would like to have this appointment asap. Or can i put an appointment with raquel  Pt stated she had her labs,chest xray, urine  @ [facility] yesterday  This is only good for 30 days

## 2013-04-11 NOTE — Telephone Encounter (Signed)
Sent mychart message

## 2013-04-11 NOTE — Telephone Encounter (Signed)
When is her surgery planned.  She will need a pre op evaluation with me and will need cardiology clearance.

## 2013-04-13 ENCOUNTER — Other Ambulatory Visit: Payer: Self-pay | Admitting: Internal Medicine

## 2013-04-20 ENCOUNTER — Ambulatory Visit: Payer: BC Managed Care – PPO | Admitting: Internal Medicine

## 2013-04-25 ENCOUNTER — Encounter: Payer: Self-pay | Admitting: Internal Medicine

## 2013-05-01 ENCOUNTER — Encounter: Payer: Self-pay | Admitting: Internal Medicine

## 2013-05-01 ENCOUNTER — Ambulatory Visit (INDEPENDENT_AMBULATORY_CARE_PROVIDER_SITE_OTHER): Payer: BC Managed Care – PPO | Admitting: Internal Medicine

## 2013-05-01 VITALS — BP 120/70 | HR 71 | Temp 97.9°F | Ht 64.0 in | Wt 232.8 lb

## 2013-05-01 DIAGNOSIS — I10 Essential (primary) hypertension: Secondary | ICD-10-CM

## 2013-05-01 DIAGNOSIS — R51 Headache: Secondary | ICD-10-CM

## 2013-05-01 DIAGNOSIS — R002 Palpitations: Secondary | ICD-10-CM

## 2013-05-01 DIAGNOSIS — R079 Chest pain, unspecified: Secondary | ICD-10-CM

## 2013-05-01 DIAGNOSIS — D473 Essential (hemorrhagic) thrombocythemia: Secondary | ICD-10-CM

## 2013-05-01 DIAGNOSIS — M1990 Unspecified osteoarthritis, unspecified site: Secondary | ICD-10-CM

## 2013-05-01 DIAGNOSIS — K219 Gastro-esophageal reflux disease without esophagitis: Secondary | ICD-10-CM

## 2013-05-01 DIAGNOSIS — R739 Hyperglycemia, unspecified: Secondary | ICD-10-CM

## 2013-05-01 DIAGNOSIS — E7800 Pure hypercholesterolemia, unspecified: Secondary | ICD-10-CM

## 2013-05-01 DIAGNOSIS — D649 Anemia, unspecified: Secondary | ICD-10-CM

## 2013-05-01 DIAGNOSIS — R7309 Other abnormal glucose: Secondary | ICD-10-CM

## 2013-05-01 LAB — TSH: TSH: 1.47 u[IU]/mL (ref 0.35–5.50)

## 2013-05-01 LAB — BASIC METABOLIC PANEL
BUN: 20 mg/dL (ref 6–23)
Calcium: 9.2 mg/dL (ref 8.4–10.5)
Creatinine, Ser: 1.1 mg/dL (ref 0.4–1.2)

## 2013-05-05 ENCOUNTER — Other Ambulatory Visit: Payer: Self-pay | Admitting: Internal Medicine

## 2013-05-05 ENCOUNTER — Encounter: Payer: Self-pay | Admitting: Internal Medicine

## 2013-05-05 NOTE — Assessment & Plan Note (Signed)
Was on topamax.  Headaches were better.  Has declined a follow up appt with Dr Neale Burly.   Off Topamax now.  Stable.

## 2013-05-05 NOTE — Assessment & Plan Note (Signed)
Saw Dr Pandit.  Follow cbc.  Recent hgb normal.   

## 2013-05-05 NOTE — Assessment & Plan Note (Signed)
Low carb diet, exercise and weight loss.  Follow.  Follow metabolic panel and a1c.    

## 2013-05-05 NOTE — Assessment & Plan Note (Signed)
Stable

## 2013-05-05 NOTE — Assessment & Plan Note (Signed)
Low cholesterol diet and exercise.  Does not tolerate statins.  Off Welchol.  Did not tolerate.  Recent cholesterol panel revealed total cholesterol 225, triglycerides 115, HDL 47 and LDL158.   Follow lipid panel.  Discussed the need for diet and exercise.       

## 2013-05-05 NOTE — Assessment & Plan Note (Signed)
Not reported as an issues today.  Follow.  

## 2013-05-05 NOTE — Progress Notes (Signed)
Subjective:    Patient ID: Nicole Sims, female    DOB: Apr 22, 1951, 62 y.o.   MRN: [id number]  HPI 62 year old female with past history of depression requiring hospitalization x 2.  She also has known reflux, IBS, tachycardia, hypercholesterolemia and headaches.  She comes in today for a scheduled follow up.  Has been under more stress recently.  See last note for details.  She is scheduled to see psych 10/14.  She is currently taking Celexa.   She was mowing two weeks ago and gets sob with exertion.  Increased sweating.  She also describes some intermittent pain under her left breast.  No significant chest congestion.  Some minimal cough.   She does report some increased sinus pressure.  Some light headedness.  Right ear stopped up.   Using flonase and advair.  Taking some Tussin.  No nausea or vomiting.  No significant bowel change.     Past Medical History  Diagnosis Date  . Hypertension   . Hypercholesterolemia   . Palpitations   . Depression   . GERD (gastroesophageal reflux disease)   . Environmental allergies   . Urinary incontinence     mixed  . Osteoarthritis   . Diverticulosis   . Anemia   . Thrombocytosis   . Chronic headaches   . Hyperglycemia   . Asthma     Current Outpatient Prescriptions on File Prior to Visit  Medication Sig Dispense Refill  . albuterol (PROVENTIL HFA;VENTOLIN HFA) 108 (90 BASE) MCG/ACT inhaler Inhale 2 puffs into the lungs every 4 (four) hours as needed.      Marland Kitchen aspirin 81 MG tablet Take 81 mg by mouth daily.      . calcium gluconate 500 MG tablet Take 500 mg by mouth 2 (two) times daily.      . citalopram (CELEXA) 20 MG tablet TAKE 1 1/2 TABLETS BY MOUTH EVERY DAY  45 tablet  2  . cyclobenzaprine (FLEXERIL) 10 MG tablet Take 10 mg by mouth 3 (three) times daily as needed.      Marland Kitchen esomeprazole (NEXIUM) 40 MG capsule Take 1 capsule (40 mg total) by mouth daily before breakfast.  30 capsule  4  . fish oil-omega-3 fatty acids 1000 MG capsule Take 1 g  by mouth 2 (two) times daily.      . fluticasone (FLONASE) 50 MCG/ACT nasal spray SPRAY TWICE IN EACH NOSTRIL EVERY DAY  16 g  2  . Fluticasone-Salmeterol (ADVAIR) 250-50 MCG/DOSE AEPB Inhale 1 puff into the lungs every 12 (twelve) hours.  60 each  2  . meloxicam (MOBIC) 7.5 MG tablet Take 7.5 mg by mouth daily.      . metoprolol succinate (TOPROL-XL) 25 MG 24 hr tablet TAKE 1 TABLET BY MOUTH EVERY DAY  30 tablet  5  . Multiple Vitamin (MULTIVITAMIN) tablet Take 1 tablet by mouth daily.      Marland Kitchen oxyCODONE (OXY IR/ROXICODONE) 5 MG immediate release tablet Take 5 mg by mouth 2 (two) times daily as needed.      . ranitidine (ZANTAC) 150 MG tablet Take 150 mg by mouth at bedtime.       No current facility-administered medications on file prior to visit.    Review of Systems Patient denies any significant headache.  Some sinus symptoms as outlined.  Some minimal cough.  Some sob with exertion.  Chest pain as outlined.  Intermittent.  No injury.   No nausea or vomiting.  No increased acid reflux reported.  No abdominal pain or cramping.  No bowel change, such as diarrhea, constipation, BRBPR or melana.  No urine change. Increased stress.  See above.  Is not taking Welchol.  Did not tolerate.  Unable and desires not to take statins.          Objective:   Physical Exam  Filed Vitals:   05/01/13 1327  BP: 120/70  Pulse: 71  Temp: 97.9 F (15.87 C)   62 year old female in no acute distress.   HEENT:  Nares - clear.  OP- without lesions or erythema.  NECK:  Supple, nontender.  No audible bruit.   HEART:  Appears to be regular.  Rate controlled.   LUNGS:  Without crackles or wheezing audible.  Respirations even and unlabored.   RADIAL PULSE:  Equal bilaterally. CHEST [place]:  No pain to palpation over the chest or under the left breast.    ABDOMEN:  Soft, nontender.  No audible abdominal bruit.   EXTREMITIES:  No increased edema to be present.                  Assessment & Plan:   CARDIOVASCULAR.  Given the chest pain and sob with exertion.  EKG obtained and revealed SR with TWI in lead III.  Non specific changes.  Discussed with her regarding cardiac w/up.  Will refer back to cardiology for evaluation and further w/up.    LIGHT HEADEDNESS.  Some sinus pressure and congestion.  Treat with Flonase and saline as directed.  Mucinex as directed.  Do not feel abx warranted at this time.  Follow.  Desires no further w/up for the light headedness.  Will follow.  Will need further w/up if light headedness does not resolve with above treatment.    PULMONARY.  Breathing relatively stable.  Some sob with exertion.  Pursue cardiac w/up as outlined.   INCREASED PSYCHOSOCIAL STRESSORS.  On celexa.  More stress recently.  Has an appt with psych in 10/14.   HEALTH MAINTENANCE.  Physical 11/27/12.  Colonoscopy 01/27/09 - diverticulosis otherwise normal. Mammogram 12/26/12 - Birads II.

## 2013-05-05 NOTE — Assessment & Plan Note (Signed)
Blood pressure has been under good control.  Same medication.  Follow.    

## 2013-05-05 NOTE — Assessment & Plan Note (Signed)
On metoprolol. Stable.  

## 2013-05-05 NOTE — Assessment & Plan Note (Signed)
Was evaluated by Dr Pandit.  Spleen ok. Follow platelet count.   

## 2013-05-21 ENCOUNTER — Ambulatory Visit: Payer: BC Managed Care – PPO | Admitting: Internal Medicine

## 2013-06-15 ENCOUNTER — Encounter: Payer: Self-pay | Admitting: *Deleted

## 2013-06-18 ENCOUNTER — Encounter: Payer: Self-pay | Admitting: Internal Medicine

## 2013-06-18 ENCOUNTER — Ambulatory Visit: Payer: Self-pay | Admitting: Orthopedic Surgery

## 2013-06-18 ENCOUNTER — Ambulatory Visit (INDEPENDENT_AMBULATORY_CARE_PROVIDER_SITE_OTHER): Payer: BC Managed Care – PPO | Admitting: Internal Medicine

## 2013-06-18 VITALS — BP 120/80 | HR 77 | Temp 97.7°F | Ht 64.0 in | Wt 237.5 lb

## 2013-06-18 DIAGNOSIS — R51 Headache: Secondary | ICD-10-CM

## 2013-06-18 DIAGNOSIS — D75839 Thrombocytosis, unspecified: Secondary | ICD-10-CM

## 2013-06-18 DIAGNOSIS — R519 Headache, unspecified: Secondary | ICD-10-CM

## 2013-06-18 DIAGNOSIS — D649 Anemia, unspecified: Secondary | ICD-10-CM

## 2013-06-18 DIAGNOSIS — R002 Palpitations: Secondary | ICD-10-CM

## 2013-06-18 DIAGNOSIS — Z23 Encounter for immunization: Secondary | ICD-10-CM

## 2013-06-18 DIAGNOSIS — I10 Essential (primary) hypertension: Secondary | ICD-10-CM

## 2013-06-18 DIAGNOSIS — K219 Gastro-esophageal reflux disease without esophagitis: Secondary | ICD-10-CM

## 2013-06-18 DIAGNOSIS — R739 Hyperglycemia, unspecified: Secondary | ICD-10-CM

## 2013-06-18 DIAGNOSIS — D473 Essential (hemorrhagic) thrombocythemia: Secondary | ICD-10-CM

## 2013-06-18 DIAGNOSIS — M1990 Unspecified osteoarthritis, unspecified site: Secondary | ICD-10-CM

## 2013-06-18 DIAGNOSIS — R7309 Other abnormal glucose: Secondary | ICD-10-CM

## 2013-06-18 DIAGNOSIS — E7800 Pure hypercholesterolemia, unspecified: Secondary | ICD-10-CM

## 2013-06-23 ENCOUNTER — Encounter: Payer: Self-pay | Admitting: Internal Medicine

## 2013-06-23 NOTE — Assessment & Plan Note (Signed)
Was evaluated by Dr Pandit.  Spleen ok. Follow platelet count.   

## 2013-06-23 NOTE — Assessment & Plan Note (Signed)
Was on topamax.  Headaches were better.  Has declined a follow up appt with Dr Freeman.   Off Topamax now.   No reported problems with headaches currently.      

## 2013-06-23 NOTE — Assessment & Plan Note (Signed)
Saw Dr Pandit.  Follow cbc.  Recent hgb normal.   

## 2013-06-23 NOTE — Progress Notes (Signed)
Subjective:    Patient ID: Nicole Sims, female    DOB: 05/12/1951, 62 y.o.   MRN: [id number]  HPI 62 year old female with past history of depression requiring hospitalization x 2.  She also has known reflux, IBS, tachycardia, hypercholesterolemia and headaches.  She comes in today for a scheduled follow up.  Has been under more stress recently.  See last note for details.  She was scheduled to see psych 10/14.  She cancelled the appt.  Wanted to hold off on the appt until next year - for financial reasons.  She is currently taking Celexa.   Overall feels she is doing a little better.  Her son is home with her now.  Still with the increased stress.  Will let me know when agreeable for psych referral.  She did see cardiology and they felt everything was stable.  Breathing stable.   No nausea or vomiting.  No significant bowel change.     Past Medical History  Diagnosis Date  . Hypertension   . Hypercholesterolemia   . Palpitations   . Depression   . GERD (gastroesophageal reflux disease)   . Environmental allergies   . Urinary incontinence     mixed  . Osteoarthritis   . Diverticulosis   . Anemia   . Thrombocytosis   . Chronic headaches   . Hyperglycemia   . Asthma     Current Outpatient Prescriptions on File Prior to Visit  Medication Sig Dispense Refill  . albuterol (PROVENTIL HFA;VENTOLIN HFA) 108 (90 BASE) MCG/ACT inhaler Inhale 2 puffs into the lungs every 4 (four) hours as needed.      Marland Kitchen aspirin 81 MG tablet Take 81 mg by mouth daily.      . calcium gluconate 500 MG tablet Take 500 mg by mouth 2 (two) times daily.      . citalopram (CELEXA) 20 MG tablet TAKE 1 1/2 TABLETS BY MOUTH EVERY DAY  45 tablet  2  . cyclobenzaprine (FLEXERIL) 10 MG tablet Take 10 mg by mouth at bedtime as needed.       . fluticasone (FLONASE) 50 MCG/ACT nasal spray SPRAY TWICE IN EACH NOSTRIL EVERY DAY  16 g  2  . Fluticasone-Salmeterol (ADVAIR) 250-50 MCG/DOSE AEPB Inhale 1 puff into the lungs every  12 (twelve) hours.  60 each  2  . meloxicam (MOBIC) 7.5 MG tablet Take 7.5 mg by mouth daily.      . metoprolol succinate (TOPROL-XL) 25 MG 24 hr tablet TAKE 1 TABLET BY MOUTH EVERY DAY  30 tablet  5  . Multiple Vitamin (MULTIVITAMIN) tablet Take 1 tablet by mouth daily.      Marland Kitchen NEXIUM 40 MG capsule TAKE 1 CAPSULE BY MOUTH EVERY DAY BEFORE BREAKFAST  30 capsule  5  . oxyCODONE (OXY IR/ROXICODONE) 5 MG immediate release tablet Take 5 mg by mouth 2 (two) times daily as needed.      . ranitidine (ZANTAC) 150 MG tablet Take 150 mg by mouth at bedtime.       No current facility-administered medications on file prior to visit.    Review of Systems Patient denies any significant headache.  No sinus symptoms.  No nausea or vomiting.  No increased acid reflux reported.   No abdominal pain or cramping.  No bowel change, such as constipation, BRBPR or melana.  No urine change. Increased stress.  See above.  Is not taking Welchol.  Did not tolerate.  Unable and desires not to take statins.  She does report some right knee pain.  Seeing Dr Rosita Kea.  Had xray last week.  Has "bone on bone'.  Continues to follow up with ortho.  Increased stress as outlined.        Objective:   Physical Exam  Filed Vitals:   06/18/13 1324  BP: 120/80  Pulse: 77  Temp: 97.7 F (44.39 C)   61 year old female in no acute distress.   HEENT:  Nares - clear.  OP- without lesions or erythema.  NECK:  Supple, nontender.  No audible bruit.   HEART:  Appears to be regular.  Rate controlled.   LUNGS:  Without crackles or wheezing audible.  Respirations even and unlabored.   RADIAL PULSE:  Equal bilaterally.  ABDOMEN:  Soft, nontender.  No audible abdominal bruit.   EXTREMITIES:  No increased edema to be present.                  Assessment & Plan:  CARDIOVASCULAR. Just saw cardiology.  Felt things were stable.  No further w/up warranted.  Currently asymptomatic.  PULMONARY.  Breathing stable.   INCREASED PSYCHOSOCIAL  STRESSORS.  On celexa.  More stress recently.  See last note and above.  Cancelled her appointment with psych.  Will let me know when agreeable for referral.     HEALTH MAINTENANCE.  Physical 11/27/12.  Colonoscopy 01/27/09 - diverticulosis otherwise normal. Mammogram 12/26/12 - Birads II.

## 2013-06-23 NOTE — Assessment & Plan Note (Signed)
Low carb diet, exercise and weight loss.  Follow.  Follow metabolic panel and a1c.    

## 2013-06-23 NOTE — Assessment & Plan Note (Signed)
Seeing Dr Menz for her knee.  Xray with "bone on bone".  Continue follow up with ortho.   

## 2013-06-23 NOTE — Assessment & Plan Note (Signed)
Low cholesterol diet and exercise.  Does not tolerate statins.  Off Welchol.  Did not tolerate.  Recent cholesterol panel revealed total cholesterol 225, triglycerides 115, HDL 47 and LDL158.   Follow lipid panel.  Discussed the need for diet and exercise.

## 2013-06-23 NOTE — Assessment & Plan Note (Signed)
Not reported as an issues today.  Follow.  

## 2013-06-23 NOTE — Assessment & Plan Note (Signed)
Blood pressure has been under good control.  Same medication.  Follow.    

## 2013-06-23 NOTE — Assessment & Plan Note (Signed)
On metoprolol. Stable.  

## 2013-06-29 ENCOUNTER — Other Ambulatory Visit: Payer: Self-pay | Admitting: Internal Medicine

## 2013-07-02 ENCOUNTER — Ambulatory Visit: Payer: Self-pay | Admitting: Orthopedic Surgery

## 2013-07-02 LAB — CBC
HGB: 12.5 g/dL (ref 12.0–16.0)
MCHC: 33.1 g/dL (ref 32.0–36.0)
MCV: 85 fL (ref 80–100)
Platelet: 514 10*3/uL — ABNORMAL HIGH (ref 150–440)
RBC: 4.45 10*6/uL (ref 3.80–5.20)
RDW: 14.3 % (ref 11.5–14.5)

## 2013-07-02 LAB — BASIC METABOLIC PANEL
Anion Gap: 6 — ABNORMAL LOW (ref 7–16)
Calcium, Total: 9.3 mg/dL (ref 8.5–10.1)
Chloride: 105 mmol/L (ref 98–107)
Co2: 28 mmol/L (ref 21–32)
EGFR (Non-African Amer.): >60
Glucose: 91 mg/dL (ref 65–99)
Osmolality: 276 (ref 275–301)

## 2013-07-02 LAB — URINALYSIS, COMPLETE
Glucose,UR: NEGATIVE mg/dL (ref 0–75)
Hyaline Cast: 2
Nitrite: NEGATIVE
Protein: NEGATIVE
Specific Gravity: 1.006 (ref 1.003–1.030)
Squamous Epithelial: 3
WBC UR: 4 /HPF (ref 0–5)

## 2013-07-02 LAB — PROTIME-INR
INR: 0.9
Prothrombin Time: 12.7 secs (ref 11.5–14.7)

## 2013-07-02 LAB — SEDIMENTATION RATE: Erythrocyte Sed Rate: 27 mm/hr (ref 0–30)

## 2013-07-02 LAB — MRSA PCR SCREENING

## 2013-07-02 LAB — APTT: Activated PTT: 31.2 secs (ref 23.6–35.9)

## 2013-07-06 ENCOUNTER — Encounter: Payer: Self-pay | Admitting: Internal Medicine

## 2013-07-10 ENCOUNTER — Inpatient Hospital Stay: Payer: Self-pay | Admitting: Orthopedic Surgery

## 2013-07-11 LAB — BASIC METABOLIC PANEL
Anion Gap: 3 — ABNORMAL LOW (ref 7–16)
BUN: 11 mg/dL (ref 7–18)
Calcium, Total: 8.7 mg/dL (ref 8.5–10.1)
Co2: 27 mmol/L (ref 21–32)
EGFR (African American): >60
EGFR (Non-African Amer.): >60
Glucose: 101 mg/dL — ABNORMAL HIGH (ref 65–99)
Osmolality: 275 (ref 275–301)
Sodium: 138 mmol/L (ref 136–145)

## 2013-07-11 LAB — HEMOGLOBIN: HGB: 11.6 g/dL — ABNORMAL LOW (ref 12.0–16.0)

## 2013-07-11 LAB — PLATELET COUNT: Platelet: 367 10*3/uL (ref 150–440)

## 2013-07-24 ENCOUNTER — Other Ambulatory Visit: Payer: Self-pay | Admitting: Internal Medicine

## 2013-09-17 ENCOUNTER — Other Ambulatory Visit: Payer: BC Managed Care – PPO

## 2013-09-17 ENCOUNTER — Other Ambulatory Visit (INDEPENDENT_AMBULATORY_CARE_PROVIDER_SITE_OTHER): Payer: BC Managed Care – PPO

## 2013-09-17 DIAGNOSIS — Z1239 Encounter for other screening for malignant neoplasm of breast: Secondary | ICD-10-CM

## 2013-09-17 DIAGNOSIS — D473 Essential (hemorrhagic) thrombocythemia: Secondary | ICD-10-CM

## 2013-09-17 DIAGNOSIS — E7800 Pure hypercholesterolemia, unspecified: Secondary | ICD-10-CM

## 2013-09-17 DIAGNOSIS — I10 Essential (primary) hypertension: Secondary | ICD-10-CM

## 2013-09-17 DIAGNOSIS — R739 Hyperglycemia, unspecified: Secondary | ICD-10-CM

## 2013-09-17 DIAGNOSIS — R7309 Other abnormal glucose: Secondary | ICD-10-CM

## 2013-09-17 DIAGNOSIS — D75839 Thrombocytosis, unspecified: Secondary | ICD-10-CM

## 2013-09-17 LAB — CBC WITH DIFFERENTIAL/PLATELET
BASOS ABS: 0.1 10*3/uL (ref 0.0–0.1)
Basophils Relative: 0.6 % (ref 0.0–3.0)
Eosinophils Absolute: 0.2 10*3/uL (ref 0.0–0.7)
Eosinophils Relative: 2.2 % (ref 0.0–5.0)
HCT: 39.1 % (ref 36.0–46.0)
Hemoglobin: 12.4 g/dL (ref 12.0–15.0)
Lymphocytes Relative: 28.2 % (ref 12.0–46.0)
Lymphs Abs: 2.5 10*3/uL (ref 0.7–4.0)
MCHC: 31.8 g/dL (ref 30.0–36.0)
MCV: 86.7 fl (ref 78.0–100.0)
MONO ABS: 0.5 10*3/uL (ref 0.1–1.0)
MONOS PCT: 5.9 % (ref 3.0–12.0)
NEUTROS PCT: 63.1 % (ref 43.0–77.0)
Neutro Abs: 5.6 10*3/uL (ref 1.4–7.7)
PLATELETS: 479 10*3/uL — AB (ref 150.0–400.0)
RBC: 4.51 Mil/uL (ref 3.87–5.11)
RDW: 15.2 % — AB (ref 11.5–14.6)
WBC: 8.8 10*3/uL (ref 4.5–10.5)

## 2013-09-17 LAB — COMPREHENSIVE METABOLIC PANEL
ALK PHOS: 91 U/L (ref 39–117)
ALT: 17 U/L (ref 0–35)
AST: 17 U/L (ref 0–37)
Albumin: 3.9 g/dL (ref 3.5–5.2)
BILIRUBIN TOTAL: 0.6 mg/dL (ref 0.3–1.2)
BUN: 14 mg/dL (ref 6–23)
CO2: 26 mEq/L (ref 19–32)
CREATININE: 0.9 mg/dL (ref 0.4–1.2)
Calcium: 9.3 mg/dL (ref 8.4–10.5)
Chloride: 105 mEq/L (ref 96–112)
GFR: 63.84 mL/min (ref >60.00)
Glucose, Bld: 93 mg/dL (ref 70–99)
Potassium: 4.3 mEq/L (ref 3.5–5.1)
Sodium: 139 mEq/L (ref 135–145)
Total Protein: 8 g/dL (ref 6.0–8.3)

## 2013-09-17 LAB — HEMOGLOBIN A1C: HEMOGLOBIN A1C: 5.7 % (ref 4.6–6.5)

## 2013-09-17 LAB — LDL CHOLESTEROL, DIRECT: LDL DIRECT: 172.8 mg/dL

## 2013-09-17 LAB — LIPID PANEL
CHOL/HDL RATIO: 5
CHOLESTEROL: 253 mg/dL — AB (ref 0–200)
HDL: 50.1 mg/dL (ref >39.00)
TRIGLYCERIDES: 147 mg/dL (ref 0.0–149.0)
VLDL: 29.4 mg/dL (ref 0.0–40.0)

## 2013-09-18 ENCOUNTER — Encounter: Payer: Self-pay | Admitting: Internal Medicine

## 2013-09-20 ENCOUNTER — Telehealth: Payer: Self-pay | Admitting: Internal Medicine

## 2013-09-20 NOTE — Telephone Encounter (Signed)
Patient Information:  Caller Name: Aymara  Phone: [phone]  Patient: Nicole Sims, Nicole Sims  Gender: Female  DOB: 07-01-1951  Age: 62 Years  PCP: Einar Pheasant  Office Follow Up:  Does the office need to follow up with this patient?: Yes  Instructions For The Office: Asking for work in appointment 09/20/13  RN Note:  Has not used Albuterol for past 2 weeks. Hydrate and humidify. No appointments remain at [facility] for 09/20/13 or 10/01/13.  No appointments remain at [facility] for 09/20/13.  has 3 month follow up scheduled for 09/24/13.  Asking if Dr Nicki Reaper could work her in to be seen 09/20/13.  Please call back.    Symptoms  Reason For Call & Symptoms: Worsening productive cough with congestion, sore throat, headache, enlarged glands in neck  Reviewed Health History In EMR: Yes  Reviewed Medications In EMR: Yes  Reviewed Allergies In EMR: Yes  Reviewed Surgeries / Procedures: Yes  Date of Onset of Symptoms: 09/06/2013  Treatments Tried: Tussin CF, Tylenol, gargling salt water, Advair, Albuterol  Treatments Tried Worked: Yes  Guideline(s) Used:  Cough  Colds  Asthma Attack  Disposition Per Guideline:   See Today or Tomorrow in Office  Reason For Disposition Reached:   Nasal discharge present > 10 days  Advice Given:  Quick-Relief Asthma Medicine:   Start your quick-relief medicine (e.g., albuterol, salbutamol) at the first sign of any coughing or shortness of breath (don't wait for wheezing). Use your inhaler (2 puffs each time) or nebulizer every 4 hours. Continue the quick-relief medicine until you have not wheezed or coughed for 48 hours.  The best "cough medicine" for an adult with asthma is always the asthma medicine (Note: Don't use cough suppressants, but cough drops may help a tickly cough).  Long-Term-Control Asthma Medicine:  If you are using a controller medicine (e.g., inhaled steroids or cromolyn), continue to take it as directed.  Drinking Liquids:  Try to drink  normal amount of liquids (e.g., water). Being adequately hydrated makes it easier to cough up the sticky lung mucus.  Humidifier:   If the air is dry, use a cool mist humidifier to prevent drying of the upper airway.  Expected Course:  If treatment is started early, most asthma attacks are quickly brought under control. All wheezing should be gone by 5 days.  Call Back If:  Inhaled asthma medicine (nebulizer or inhaler) is needed more often than every 4 hours  You become worse.  Patient Will Follow Care Advice:  YES

## 2013-09-20 NOTE — Telephone Encounter (Signed)
Pt notified & will try to go today because she is unable to go tomorrow. She will still keep her appt with you on Monday also.

## 2013-09-20 NOTE — Telephone Encounter (Signed)
Please let her know that I am not in the office today and I am not sure what will be tomorrow.  Sounds like she needs to be evaluated.  If no appts today or tomorrow - then acute care.

## 2013-09-20 NOTE — Telephone Encounter (Signed)
No appointment please advise.

## 2013-09-24 ENCOUNTER — Encounter: Payer: Self-pay | Admitting: Internal Medicine

## 2013-09-24 ENCOUNTER — Ambulatory Visit (INDEPENDENT_AMBULATORY_CARE_PROVIDER_SITE_OTHER): Payer: BC Managed Care – PPO | Admitting: Internal Medicine

## 2013-09-24 VITALS — BP 130/80 | HR 92 | Temp 97.7°F | Ht 64.0 in | Wt 225.5 lb

## 2013-09-24 DIAGNOSIS — R002 Palpitations: Secondary | ICD-10-CM

## 2013-09-24 DIAGNOSIS — R7309 Other abnormal glucose: Secondary | ICD-10-CM

## 2013-09-24 DIAGNOSIS — I10 Essential (primary) hypertension: Secondary | ICD-10-CM

## 2013-09-24 DIAGNOSIS — R739 Hyperglycemia, unspecified: Secondary | ICD-10-CM

## 2013-09-24 DIAGNOSIS — K219 Gastro-esophageal reflux disease without esophagitis: Secondary | ICD-10-CM

## 2013-09-24 DIAGNOSIS — D75839 Thrombocytosis, unspecified: Secondary | ICD-10-CM

## 2013-09-24 DIAGNOSIS — M1990 Unspecified osteoarthritis, unspecified site: Secondary | ICD-10-CM

## 2013-09-24 DIAGNOSIS — R51 Headache: Secondary | ICD-10-CM

## 2013-09-24 DIAGNOSIS — J069 Acute upper respiratory infection, unspecified: Secondary | ICD-10-CM

## 2013-09-24 DIAGNOSIS — E7800 Pure hypercholesterolemia, unspecified: Secondary | ICD-10-CM

## 2013-09-24 DIAGNOSIS — D473 Essential (hemorrhagic) thrombocythemia: Secondary | ICD-10-CM

## 2013-09-24 DIAGNOSIS — R519 Headache, unspecified: Secondary | ICD-10-CM

## 2013-09-24 DIAGNOSIS — D649 Anemia, unspecified: Secondary | ICD-10-CM

## 2013-09-24 NOTE — Progress Notes (Signed)
Subjective:    Patient ID: Nicole Sims, female    DOB: Apr 24, 1951, 63 y.o.   MRN: [id number]  HPI 63 year old female with past history of depression requiring hospitalization x 2.  She also has known reflux, IBS, tachycardia, hypercholesterolemia and headaches.  She comes in today for a scheduled follow up.  Has been under more stress recently.  See last note for details.  She was scheduled to see psych 10/14.  She cancelled the appt.  Wanted to hold off on the appt until this year - for financial reasons.  She is currently taking Celexa.   Overall feels she is doing a little better.  Her son is home with her now.  He is now helping around the house.  Still with the increased stress.  Will let me know when agreeable for psych referral.  She did see cardiology and they felt everything was stable.   No nausea or vomiting.  No significant bowel change.  She was evaluated at acute care [facility]) for cough and congestion.  Was placed on prednisone, avelox and tessalon perles.  Since being on this regimen - no sore throat.  Better.  Does feel jittery.  Feel related to the prednisone.  Not using advair.  Discussed her cholesterol.  She is willing to retry crestor at a low dose.     Past Medical History  Diagnosis Date  . Hypertension   . Hypercholesterolemia   . Palpitations   . Depression   . GERD (gastroesophageal reflux disease)   . Environmental allergies   . Urinary incontinence     mixed  . Osteoarthritis   . Diverticulosis   . Anemia   . Thrombocytosis   . Chronic headaches   . Hyperglycemia   . Asthma     Current Outpatient Prescriptions on File Prior to Visit  Medication Sig Dispense Refill  . ADVAIR DISKUS 250-50 MCG/DOSE AEPB INHALE 1 PUFF INTO THE LUNGS EVERY 12 HOURS  60 each  2  . albuterol (PROVENTIL HFA;VENTOLIN HFA) 108 (90 BASE) MCG/ACT inhaler Inhale 2 puffs into the lungs every 4 (four) hours as needed.      Marland Kitchen aspirin 81 MG tablet Take 81 mg by mouth daily.      .  calcium gluconate 500 MG tablet Take 500 mg by mouth 2 (two) times daily.      . citalopram (CELEXA) 20 MG tablet TAKE 1 1/2 TABLETS BY MOUTH EVERY DAY  45 tablet  2  . cyclobenzaprine (FLEXERIL) 10 MG tablet Take 10 mg by mouth at bedtime as needed.       . fluticasone (FLONASE) 50 MCG/ACT nasal spray INSTILL 2 SPRAYS IN EACH NOSTRIL EVERY DAY  16 g  2  . meloxicam (MOBIC) 7.5 MG tablet Take 7.5 mg by mouth daily.      . metoprolol succinate (TOPROL-XL) 25 MG 24 hr tablet TAKE 1 TABLET BY MOUTH EVERY DAY  30 tablet  5  . Multiple Vitamin (MULTIVITAMIN) tablet Take 1 tablet by mouth daily.      Marland Kitchen NEXIUM 40 MG capsule TAKE 1 CAPSULE BY MOUTH EVERY DAY BEFORE BREAKFAST  30 capsule  5  . oxyCODONE (OXY IR/ROXICODONE) 5 MG immediate release tablet Take 5 mg by mouth 2 (two) times daily as needed.      . ranitidine (ZANTAC) 150 MG tablet Take 150 mg by mouth at bedtime.      . vitamin E 400 UNIT capsule Take 400 Units by mouth daily.  No current facility-administered medications on file prior to visit.    Review of Systems Patient denies any significant headache.  No sinus symptoms.  No nausea or vomiting.  No increased acid reflux reported. No abdominal pain or cramping.  No bowel change, such as constipation, BRBPR or melana.  No urine change. Increased stress.  See above.  Is not taking Welchol.  Did not tolerate.  Had been unable and desired not to take statins.  Discussed with her today.  She agreed to retry crestor.   She does report some right knee pain.  Seeing Dr Rudene Christians.  Had xray last week.  Has "bone on bone'.  Continues to follow up with ortho.  Increased stress as outlined.  Doing better.  Cough and congestion as outlined.  Better.        Objective:   Physical Exam  Filed Vitals:   09/24/13 1451  BP: 130/80  Pulse: 92  Temp: 97.7 F (23.60 C)   63 year old female in no acute distress.   HEENT:  Nares - clear.  OP- without lesions or erythema.  NECK:  Supple, nontender.  No  audible bruit.   HEART:  Appears to be regular.  Rate controlled.   LUNGS:  Without crackles or wheezing audible.  Respirations even and unlabored.   RADIAL PULSE:  Equal bilaterally.  ABDOMEN:  Soft, nontender.  No audible abdominal bruit.   EXTREMITIES:  No increased edema to be present.                  Assessment & Plan:  CARDIOVASCULAR. Just saw cardiology.  Felt things were stable.  No further w/up warranted.  Currently asymptomatic.  PULMONARY.  Breathing stable.  Being treated for current infection.  Restart Advair.    INCREASED PSYCHOSOCIAL STRESSORS.  On celexa.  More stress recently. Some better now.  Follow.     HEALTH MAINTENANCE.  Physical 11/27/12.  Colonoscopy 01/27/09 - diverticulosis otherwise normal. Mammogram 12/26/12 - Birads II.

## 2013-09-24 NOTE — Progress Notes (Signed)
Pre-visit discussion using our clinic review tool. No additional management support is needed unless otherwise documented below in the visit note.  

## 2013-09-30 ENCOUNTER — Encounter: Payer: Self-pay | Admitting: Internal Medicine

## 2013-09-30 DIAGNOSIS — R000 Tachycardia, unspecified: Secondary | ICD-10-CM | POA: Insufficient documentation

## 2013-09-30 NOTE — Assessment & Plan Note (Signed)
Low carb diet, exercise and weight loss.  Follow.  Follow metabolic panel and I0X.  Most recent a1c (09/17/13) - 5.7.

## 2013-09-30 NOTE — Assessment & Plan Note (Signed)
Seeing Dr Rudene Christians for her knee.  Xray with "bone on bone".  Continue follow up with ortho.

## 2013-09-30 NOTE — Assessment & Plan Note (Signed)
Saw Dr Ma Hillock.  Follow cbc.  Recent hgb normal.

## 2013-09-30 NOTE — Assessment & Plan Note (Signed)
Not reported as an issues today.  Follow.

## 2013-09-30 NOTE — Assessment & Plan Note (Signed)
Was on topamax.  Headaches were better.  Has declined a follow up appt with Dr Domingo Cocking.   Off Topamax now.   No reported problems with headaches currently.

## 2013-09-30 NOTE — Assessment & Plan Note (Signed)
Blood pressure has been under good control.  Same medication.  Follow.

## 2013-09-30 NOTE — Assessment & Plan Note (Signed)
Was evaluated by Dr Pandit.  Spleen ok. Follow platelet count.   

## 2013-09-30 NOTE — Assessment & Plan Note (Signed)
Low cholesterol diet and exercise.  Does not tolerate statins.  Off Welchol.  Did not tolerate.  Recent cholesterol panel revealed total cholesterol 253, triglycerides 147, HDL 50 and LDL173.   Follow lipid panel.  Discussed the need for diet and exercise.  She did agree to [employer].  Will start 5mg .  Follow lipid panel and liver function.

## 2013-09-30 NOTE — Assessment & Plan Note (Signed)
Currently being treated.  Needs to restart her advair.  Follow.

## 2013-09-30 NOTE — Assessment & Plan Note (Signed)
On metoprolol.  Stable.  Reports feel jittery now.  Feel related to prednisone.  No increased palpitations.

## 2013-10-07 ENCOUNTER — Other Ambulatory Visit: Payer: Self-pay | Admitting: Internal Medicine

## 2013-10-26 ENCOUNTER — Other Ambulatory Visit: Payer: Self-pay | Admitting: Internal Medicine

## 2013-11-01 ENCOUNTER — Encounter: Payer: Self-pay | Admitting: Internal Medicine

## 2013-11-01 DIAGNOSIS — F32A Depression, unspecified: Secondary | ICD-10-CM

## 2013-11-01 DIAGNOSIS — F329 Major depressive disorder, single episode, unspecified: Secondary | ICD-10-CM

## 2013-11-01 DIAGNOSIS — F439 Reaction to severe stress, unspecified: Secondary | ICD-10-CM

## 2013-11-02 NOTE — Telephone Encounter (Signed)
See pts my chart message.  Order placed for referral to psychiatry.  Please contact her and confirm doing ok and ok to wait for appt with psych.

## 2013-11-02 NOTE — Telephone Encounter (Signed)
Pt states that she thinks that she can. Wants to know what other option does she have? Amber: When we make the appointment it needs to be on a Monday or Tuesday.

## 2013-11-05 ENCOUNTER — Other Ambulatory Visit (INDEPENDENT_AMBULATORY_CARE_PROVIDER_SITE_OTHER): Payer: BC Managed Care – PPO

## 2013-11-05 DIAGNOSIS — E7800 Pure hypercholesterolemia, unspecified: Secondary | ICD-10-CM

## 2013-11-05 DIAGNOSIS — R002 Palpitations: Secondary | ICD-10-CM

## 2013-11-05 LAB — HEPATIC FUNCTION PANEL
ALBUMIN: 3.9 g/dL (ref 3.5–5.2)
ALT: 17 U/L (ref 0–35)
AST: 18 U/L (ref 0–37)
Alkaline Phosphatase: 90 U/L (ref 39–117)
BILIRUBIN TOTAL: 0.4 mg/dL (ref 0.3–1.2)
Bilirubin, Direct: 0 mg/dL (ref 0.0–0.3)
Total Protein: 7.5 g/dL (ref 6.0–8.3)

## 2013-11-05 LAB — TSH: TSH: 0.43 u[IU]/mL (ref 0.35–5.50)

## 2013-11-06 ENCOUNTER — Encounter: Payer: Self-pay | Admitting: Internal Medicine

## 2013-11-09 ENCOUNTER — Encounter: Payer: Self-pay | Admitting: Emergency Medicine

## 2013-11-19 ENCOUNTER — Telehealth: Payer: Self-pay | Admitting: *Deleted

## 2013-11-19 ENCOUNTER — Other Ambulatory Visit (INDEPENDENT_AMBULATORY_CARE_PROVIDER_SITE_OTHER): Payer: BC Managed Care – PPO

## 2013-11-19 DIAGNOSIS — D473 Essential (hemorrhagic) thrombocythemia: Secondary | ICD-10-CM

## 2013-11-19 DIAGNOSIS — D75839 Thrombocytosis, unspecified: Secondary | ICD-10-CM

## 2013-11-19 NOTE — Telephone Encounter (Signed)
Pt just came in on 11/06/13.  I do not see where needs labs.  Please confirm with pt if she is aware of any other labs needed.  Thanks.

## 2013-11-19 NOTE — Telephone Encounter (Signed)
What labs and dx?  

## 2013-11-19 NOTE — Telephone Encounter (Signed)
Ordered f/u cbc.

## 2013-11-19 NOTE — Telephone Encounter (Signed)
Called pt, she said she didn't know that she was only told to come back to get labs done?

## 2013-11-20 LAB — CBC WITH DIFFERENTIAL/PLATELET
Basophils Absolute: 0 10*3/uL (ref 0.0–0.1)
Basophils Relative: 0.5 % (ref 0.0–3.0)
EOS ABS: 0.3 10*3/uL (ref 0.0–0.7)
Eosinophils Relative: 3.5 % (ref 0.0–5.0)
HCT: 36.3 % (ref 36.0–46.0)
Hemoglobin: 11.9 g/dL — ABNORMAL LOW (ref 12.0–15.0)
LYMPHS PCT: 31.9 % (ref 12.0–46.0)
Lymphs Abs: 2.7 10*3/uL (ref 0.7–4.0)
MCHC: 32.7 g/dL (ref 30.0–36.0)
MCV: 84.1 fl (ref 78.0–100.0)
MONO ABS: 0.5 10*3/uL (ref 0.1–1.0)
Monocytes Relative: 6.1 % (ref 3.0–12.0)
Neutro Abs: 5 10*3/uL (ref 1.4–7.7)
Neutrophils Relative %: 58 % (ref 43.0–77.0)
PLATELETS: 384 10*3/uL (ref 150.0–400.0)
RBC: 4.31 Mil/uL (ref 3.87–5.11)
RDW: 15.1 % — ABNORMAL HIGH (ref 11.5–14.6)
WBC: 8.6 10*3/uL (ref 4.5–10.5)

## 2013-11-23 ENCOUNTER — Telehealth: Payer: Self-pay | Admitting: Internal Medicine

## 2013-11-24 ENCOUNTER — Telehealth: Payer: Self-pay | Admitting: Internal Medicine

## 2013-11-24 ENCOUNTER — Encounter: Payer: Self-pay | Admitting: Internal Medicine

## 2013-11-24 DIAGNOSIS — D649 Anemia, unspecified: Secondary | ICD-10-CM

## 2013-11-24 NOTE — Telephone Encounter (Signed)
Pt notified of lab results via my chart.  Needs a non fasting lab appt in 2 weeks.  Please schedule and notify pt of appt date and time.  Thanks   Dr Nicki Reaper

## 2013-11-26 NOTE — Telephone Encounter (Signed)
Sent my chart message letting pt know about appointment date and time 4/28

## 2013-12-03 ENCOUNTER — Ambulatory Visit (INDEPENDENT_AMBULATORY_CARE_PROVIDER_SITE_OTHER): Payer: BC Managed Care – PPO | Admitting: Internal Medicine

## 2013-12-03 ENCOUNTER — Encounter: Payer: Self-pay | Admitting: Internal Medicine

## 2013-12-03 VITALS — BP 130/78 | HR 98 | Temp 97.8°F | Ht 65.0 in | Wt 233.5 lb

## 2013-12-03 DIAGNOSIS — R739 Hyperglycemia, unspecified: Secondary | ICD-10-CM

## 2013-12-03 DIAGNOSIS — D649 Anemia, unspecified: Secondary | ICD-10-CM

## 2013-12-03 DIAGNOSIS — R7309 Other abnormal glucose: Secondary | ICD-10-CM

## 2013-12-03 DIAGNOSIS — E7800 Pure hypercholesterolemia, unspecified: Secondary | ICD-10-CM

## 2013-12-03 DIAGNOSIS — K219 Gastro-esophageal reflux disease without esophagitis: Secondary | ICD-10-CM

## 2013-12-03 DIAGNOSIS — D75839 Thrombocytosis, unspecified: Secondary | ICD-10-CM

## 2013-12-03 DIAGNOSIS — D473 Essential (hemorrhagic) thrombocythemia: Secondary | ICD-10-CM

## 2013-12-03 DIAGNOSIS — R000 Tachycardia, unspecified: Secondary | ICD-10-CM

## 2013-12-03 DIAGNOSIS — I10 Essential (primary) hypertension: Secondary | ICD-10-CM

## 2013-12-03 DIAGNOSIS — M542 Cervicalgia: Secondary | ICD-10-CM

## 2013-12-03 MED ORDER — METOPROLOL SUCCINATE ER 50 MG PO TB24: 50.0000 mg | ORAL_TABLET | Freq: Every day | ORAL | Status: DC

## 2013-12-03 NOTE — Progress Notes (Addendum)
Subjective:    Patient ID: Nicole Sims, female    DOB: 12/05/1950, 63 y.o.   MRN: [id number]  HPI 63 year old female with past history of depression requiring hospitalization x 2.  She also has known reflux, IBS, tachycardia, hypercholesterolemia and headaches.  She comes in today to follow up on these issues as well as for a complete physical exam.   Has been under more stress recently.  See previous notes for details.  She is now on zoloft.  (100mg  now).   Seeing psychiatry.   Feels jittery with the change.    No nausea or vomiting.  No significant bowel change.  States on 11/16/13, she noticed some pain in her neck and shoulder blades.  Blood pressure elevated (179/100).  She also has noticed some intermittent episodes of heart racing.       Past Medical History  Diagnosis Date  . Hypertension   . Hypercholesterolemia   . Palpitations   . Depression   . GERD (gastroesophageal reflux disease)   . Environmental allergies   . Urinary incontinence     mixed  . Osteoarthritis   . Diverticulosis   . Anemia   . Thrombocytosis   . Chronic headaches   . Hyperglycemia   . Asthma     Current Outpatient Prescriptions on File Prior to Visit  Medication Sig Dispense Refill  . ADVAIR DISKUS 250-50 MCG/DOSE AEPB INHALE 1 PUFF INTO THE LUNGS EVERY 12 HOURS  60 each  2  . albuterol (PROVENTIL HFA;VENTOLIN HFA) 108 (90 BASE) MCG/ACT inhaler Inhale 2 puffs into the lungs every 4 (four) hours as needed.      Marland Kitchen aspirin 81 MG tablet Take 81 mg by mouth daily.      . benzonatate (TESSALON) 100 MG capsule Take by mouth 3 (three) times daily as needed for cough.      . calcium gluconate 500 MG tablet Take 500 mg by mouth 2 (two) times daily.      . citalopram (CELEXA) 20 MG tablet TAKE 1&1/2 TABLET BY MOUTH EVERY DAY  45 tablet  2  . cyclobenzaprine (FLEXERIL) 10 MG tablet Take 10 mg by mouth at bedtime as needed.       . fluticasone (FLONASE) 50 MCG/ACT nasal spray INSTILL 2 SPRAYS IN EACH NOSTRIL  EVERY DAY  16 g  2  . meloxicam (MOBIC) 7.5 MG tablet Take 7.5 mg by mouth daily.      . metoprolol succinate (TOPROL-XL) 25 MG 24 hr tablet TAKE 1 TABLET BY MOUTH EVERY DAY  30 tablet  5  . Multiple Vitamin (MULTIVITAMIN) tablet Take 1 tablet by mouth daily.      Marland Kitchen NEXIUM 40 MG capsule TAKE ONE CAPSULE BY MOUTH EVERY DAY BEFORE BREAKFAST  30 capsule  5  . oxyCODONE (OXY IR/ROXICODONE) 5 MG immediate release tablet Take 5 mg by mouth 2 (two) times daily as needed.      . ranitidine (ZANTAC) 150 MG tablet Take 150 mg by mouth at bedtime.      . vitamin E 400 UNIT capsule Take 400 Units by mouth daily.       No current facility-administered medications on file prior to visit.    Review of Systems Patient denies any significant headache.  No sinus symptoms.  No nausea or vomiting.  No increased acid reflux reported. No abdominal pain or cramping.  No bowel change, such as constipation, BRBPR or melana.  No urine change. Increased stress.  Seeing psychiatry.  Now on zoloft.  Also taking crestor now.  Tolerating.  Increased heart rate and neck and shoulder pain as outlined.         Objective:   Physical Exam  Filed Vitals:   12/03/13 1436  BP: 130/78  Pulse: 98  Temp: 97.8 F (36.6 C)   Blood pressure recheck:  124/82, pulse 44  63 year old female in no acute distress.   HEENT:  Nares- clear.  Oropharynx - without lesions. NECK:  Supple.  Nontender.  No audible bruit.  HEART:  Appears to be regular. LUNGS:  No crackles or wheezing audible.  Respirations even and unlabored.  RADIAL PULSE:  Equal bilaterally.    BREASTS:  No nipple discharge or nipple retraction present.  Could not appreciate any distinct nodules or axillary adenopathy.  Some increased fullness right breast.  Increased tenderness right breast.   ABDOMEN:  Soft, nontender.  Bowel sounds present and normal.  No audible abdominal bruit.  GU:  Not performed.    EXTREMITIES:  No increased edema present.  DP pulses palpable  and equal bilaterally.      FEET:  No lesions.       Assessment & Plan:  PULMONARY.  Breathing stable.    HEALTH MAINTENANCE.  Physical today.  Colonoscopy 01/27/09 - diverticulosis otherwise normal.  Mammogram 12/26/12 - Birads II.  Schedule a f/u mammogram when due.  Will schedule diagnostic given her history of dense breasts and the fullness and tenderness.    I spent 40 minutes with the patient and more than 50% of the time was spent in consultation regarding the above.

## 2013-12-03 NOTE — Progress Notes (Signed)
Pre visit review using our clinic review tool, if applicable. No additional management support is needed unless otherwise documented below in the visit note. 

## 2013-12-04 ENCOUNTER — Telehealth: Payer: Self-pay | Admitting: Emergency Medicine

## 2013-12-04 LAB — IBC PANEL
IRON: 49 ug/dL (ref 42–145)
Saturation Ratios: 11.8 % — ABNORMAL LOW (ref 20.0–50.0)
Transferrin: 296.8 mg/dL (ref 212.0–360.0)

## 2013-12-04 LAB — FERRITIN: FERRITIN: 32.4 ng/mL (ref 10.0–291.0)

## 2013-12-04 LAB — VITAMIN B12: VITAMIN B 12: 679 pg/mL (ref 211–911)

## 2013-12-04 LAB — HEMOGLOBIN: Hemoglobin: 12.3 g/dL (ref 12.0–15.0)

## 2013-12-04 MED ORDER — ROSUVASTATIN CALCIUM 5 MG PO TABS: 5.0000 mg | ORAL_TABLET | Freq: Every day | ORAL | Status: DC

## 2013-12-04 NOTE — Telephone Encounter (Signed)
Sent in rx for crestor 5mg  #30 with 3 refills.

## 2013-12-04 NOTE — Telephone Encounter (Signed)
Spoke with patient about Cards apt on Thursday. She informed me that she forgot to tell you yesterday she needs a re-fill/ script for crestor. Please advise.

## 2013-12-05 ENCOUNTER — Encounter: Payer: Self-pay | Admitting: Internal Medicine

## 2013-12-05 NOTE — Assessment & Plan Note (Addendum)
Saw Dr Ma Hillock.  Follow cbc.  Recent hgb slightly decreased.  Recheck hgb and iron stores and B12.

## 2013-12-05 NOTE — Assessment & Plan Note (Signed)
Not reported as an issue today.  Follow.   

## 2013-12-05 NOTE — Assessment & Plan Note (Signed)
Was evaluated by Dr Pandit.  Spleen ok. Follow platelet count.   

## 2013-12-05 NOTE — Assessment & Plan Note (Signed)
On crestor and tolerating.  Follow lipid panel and liver function.    

## 2013-12-05 NOTE — Assessment & Plan Note (Signed)
Blood pressure as outlined.  Recent elevation with the increased heart rate and episodes as outlined.  Will increase Toprol XL to 50mg  q day.  Follow.

## 2013-12-05 NOTE — Assessment & Plan Note (Signed)
Low carb diet, exercise and weight loss.  Follow.  Follow metabolic panel and O8I.  Most recent a1c (09/17/13) - 5.7.

## 2013-12-05 NOTE — Assessment & Plan Note (Signed)
Increased heart rate and previous neck and shoulder pain as outlined.  EKG revealed SR with no acute ischemic changes.  Will increase Toprol XL to 50mg  q day.  Refer back to cardiology for further evaluation and treatment.  May need holter or event monitor.  (heart rate max 157 with one of the episodes).

## 2013-12-11 ENCOUNTER — Other Ambulatory Visit: Payer: BC Managed Care – PPO

## 2013-12-12 ENCOUNTER — Encounter: Payer: Self-pay | Admitting: Internal Medicine

## 2013-12-19 ENCOUNTER — Telehealth: Payer: Self-pay | Admitting: Internal Medicine

## 2013-12-19 ENCOUNTER — Encounter: Payer: Self-pay | Admitting: Internal Medicine

## 2013-12-19 DIAGNOSIS — N6489 Other specified disorders of breast: Secondary | ICD-10-CM

## 2013-12-19 DIAGNOSIS — N644 Mastodynia: Secondary | ICD-10-CM

## 2013-12-19 NOTE — Telephone Encounter (Signed)
Mammogram (diagnostic) - order placed.

## 2013-12-21 NOTE — Telephone Encounter (Signed)
Duplicate mychart message was sent. The first message has been read.

## 2014-01-25 DIAGNOSIS — M7918 Myalgia, other site: Secondary | ICD-10-CM | POA: Insufficient documentation

## 2014-01-25 DIAGNOSIS — M5412 Radiculopathy, cervical region: Secondary | ICD-10-CM | POA: Insufficient documentation

## 2014-01-28 ENCOUNTER — Other Ambulatory Visit: Payer: Self-pay | Admitting: Internal Medicine

## 2014-01-28 DIAGNOSIS — M47812 Spondylosis without myelopathy or radiculopathy, cervical region: Secondary | ICD-10-CM | POA: Insufficient documentation

## 2014-01-28 DIAGNOSIS — N644 Mastodynia: Secondary | ICD-10-CM

## 2014-01-28 NOTE — Progress Notes (Signed)
Order placed for right and left breast ultrasound.

## 2014-01-29 ENCOUNTER — Ambulatory Visit: Payer: Self-pay | Admitting: Internal Medicine

## 2014-01-29 ENCOUNTER — Other Ambulatory Visit: Payer: Self-pay | Admitting: *Deleted

## 2014-01-29 DIAGNOSIS — N6489 Other specified disorders of breast: Secondary | ICD-10-CM

## 2014-01-29 DIAGNOSIS — N644 Mastodynia: Secondary | ICD-10-CM

## 2014-01-29 LAB — HM MAMMOGRAPHY: HM Mammogram: NEGATIVE

## 2014-01-30 ENCOUNTER — Encounter: Payer: Self-pay | Admitting: Internal Medicine

## 2014-02-18 ENCOUNTER — Ambulatory Visit (INDEPENDENT_AMBULATORY_CARE_PROVIDER_SITE_OTHER): Payer: BC Managed Care – PPO | Admitting: Internal Medicine

## 2014-02-18 ENCOUNTER — Encounter: Payer: Self-pay | Admitting: Internal Medicine

## 2014-02-18 VITALS — BP 110/70 | HR 78 | Temp 97.8°F | Ht 65.0 in | Wt 243.0 lb

## 2014-02-18 DIAGNOSIS — D75839 Thrombocytosis, unspecified: Secondary | ICD-10-CM

## 2014-02-18 DIAGNOSIS — E669 Obesity, unspecified: Secondary | ICD-10-CM

## 2014-02-18 DIAGNOSIS — R7309 Other abnormal glucose: Secondary | ICD-10-CM

## 2014-02-18 DIAGNOSIS — F439 Reaction to severe stress, unspecified: Secondary | ICD-10-CM

## 2014-02-18 DIAGNOSIS — I10 Essential (primary) hypertension: Secondary | ICD-10-CM

## 2014-02-18 DIAGNOSIS — Z733 Stress, not elsewhere classified: Secondary | ICD-10-CM

## 2014-02-18 DIAGNOSIS — K219 Gastro-esophageal reflux disease without esophagitis: Secondary | ICD-10-CM

## 2014-02-18 DIAGNOSIS — R739 Hyperglycemia, unspecified: Secondary | ICD-10-CM

## 2014-02-18 DIAGNOSIS — D649 Anemia, unspecified: Secondary | ICD-10-CM

## 2014-02-18 DIAGNOSIS — D473 Essential (hemorrhagic) thrombocythemia: Secondary | ICD-10-CM

## 2014-02-18 DIAGNOSIS — J329 Chronic sinusitis, unspecified: Secondary | ICD-10-CM

## 2014-02-18 DIAGNOSIS — E7800 Pure hypercholesterolemia, unspecified: Secondary | ICD-10-CM

## 2014-02-18 MED ORDER — LEVOFLOXACIN 500 MG PO TABS: 500.0000 mg | ORAL_TABLET | Freq: Every day | ORAL | Status: DC

## 2014-02-18 NOTE — Progress Notes (Signed)
Pre visit review using our clinic review tool, if applicable. No additional management support is needed unless otherwise documented below in the visit note. 

## 2014-02-24 ENCOUNTER — Encounter: Payer: Self-pay | Admitting: Internal Medicine

## 2014-02-24 DIAGNOSIS — J329 Chronic sinusitis, unspecified: Secondary | ICD-10-CM | POA: Insufficient documentation

## 2014-02-24 DIAGNOSIS — F439 Reaction to severe stress, unspecified: Secondary | ICD-10-CM | POA: Insufficient documentation

## 2014-02-24 DIAGNOSIS — E669 Obesity, unspecified: Secondary | ICD-10-CM

## 2014-02-24 MED ORDER — ALBUTEROL SULFATE HFA 108 (90 BASE) MCG/ACT IN AERS: 2.0000 | INHALATION_SPRAY | RESPIRATORY_TRACT | Status: DC | PRN

## 2014-02-24 NOTE — Assessment & Plan Note (Signed)
Sinusitis/uri as outlined.  Treated with doxycycline.  Concern that was partially treated.  Levaquin 500mg  x 1 week.  Advair bid and ventolin as needed.  Mucinex/robitussin as directed.  Continue saline and flonase.  Follow.

## 2014-02-24 NOTE — Assessment & Plan Note (Signed)
Low carb diet, exercise and weight loss.  Follow.  Follow metabolic panel and X5A.

## 2014-02-24 NOTE — Assessment & Plan Note (Signed)
Not reported as an issue today.  Follow.   

## 2014-02-24 NOTE — Assessment & Plan Note (Signed)
Blood pressure as outlined.  Recent elevation with the increased heart rate and episodes as outlined.  Toprol XL was increased to 50mg  q day last visit.  Blood pressure doing better.  Follow.

## 2014-02-24 NOTE — Assessment & Plan Note (Signed)
Was evaluated by Dr Ma Hillock.  Spleen ok. Follow platelet count.

## 2014-02-24 NOTE — Progress Notes (Signed)
Subjective:    Patient ID: Nicole Sims, female    DOB: October 20, 1950, 63 y.o.   MRN: [id number]  HPI 64 year old female with past history of depression requiring hospitalization x 2.  She also has known reflux, IBS, tachycardia, hypercholesterolemia and headaches.  She comes in today for a scheduled follow up.   Has been under more stress recently.  See previous notes for details.  We discussed this again today.  Increased stress with  Some issues with her husband.  She is on effexor.  Just recently increased to 150mg  q day.  Seeing psychiatry.   No nausea or vomiting.  No significant bowel change.  Was seen in acute care two weeks ago.  Had headache and congestion (cough, etc).  Had noticed some blood tinged mucus.  States felt to coming from drainage from her nose.  No gross hemoptysis.  Was given doxycycline and Tessalon Perles - for 7 days.  States she is better, but still with congestion and persistent symptoms.       Past Medical History  Diagnosis Date  . Hypertension   . Hypercholesterolemia   . Palpitations   . Depression   . GERD (gastroesophageal reflux disease)   . Environmental allergies   . Urinary incontinence     mixed  . Osteoarthritis   . Diverticulosis   . Anemia   . Thrombocytosis   . Chronic headaches   . Hyperglycemia   . Asthma     Current Outpatient Prescriptions on File Prior to Visit  Medication Sig Dispense Refill  . ADVAIR DISKUS 250-50 MCG/DOSE AEPB INHALE 1 PUFF INTO THE LUNGS EVERY 12 HOURS  60 each  2  . albuterol (PROVENTIL HFA;VENTOLIN HFA) 108 (90 BASE) MCG/ACT inhaler Inhale 2 puffs into the lungs every 4 (four) hours as needed.      Marland Kitchen aspirin 81 MG tablet Take 81 mg by mouth daily.      . calcium gluconate 500 MG tablet Take 500 mg by mouth 2 (two) times daily.      . cyclobenzaprine (FLEXERIL) 10 MG tablet Take 10 mg by mouth at bedtime as needed.       . fluticasone (FLONASE) 50 MCG/ACT nasal spray INSTILL 2 SPRAYS IN EACH NOSTRIL EVERY DAY  16  g  2  . meloxicam (MOBIC) 7.5 MG tablet Take 7.5 mg by mouth daily.      . metoprolol succinate (TOPROL XL) 50 MG 24 hr tablet Take 1 tablet (50 mg total) by mouth daily. Take with or immediately following a meal.  30 tablet  3  . Multiple Vitamin (MULTIVITAMIN) tablet Take 1 tablet by mouth daily.      Marland Kitchen NEXIUM 40 MG capsule TAKE ONE CAPSULE BY MOUTH EVERY DAY BEFORE BREAKFAST  30 capsule  5  . oxyCODONE (OXY IR/ROXICODONE) 5 MG immediate release tablet Take 5 mg by mouth 2 (two) times daily as needed.      . ranitidine (ZANTAC) 150 MG tablet Take 150 mg by mouth at bedtime.      . rosuvastatin (CRESTOR) 5 MG tablet Take 1 tablet (5 mg total) by mouth daily.  30 tablet  3  . vitamin E 400 UNIT capsule Take 400 Units by mouth daily.      . benzonatate (TESSALON) 100 MG capsule Take by mouth 3 (three) times daily as needed for cough.       No current facility-administered medications on file prior to visit.    Review of Systems  having some issues with headache.   Some increased sinus congestion as outlined.  No sore throat now.  Still with drainage and cough.   No nausea or vomiting.  No increased acid reflux reported. No abdominal pain or cramping.  No bowel change, such as constipation, BRBPR or melana.  No urine change. Increased stress.  Seeing psychiatry.  Now on effexor.  Dose just increased.   Also taking crestor now.  Tolerating.          Objective:   Physical Exam  Filed Vitals:   02/18/14 1456  BP: 110/70  Pulse: 78  Temp: 97.8 F (58.13 C)   63 year old female in no acute distress.   HEENT:  Nares- slightly erythematous turbinates.  Oropharynx - without lesions.   NECK:  Supple.  Nontender.  No audible bruit.  HEART:  Appears to be regular. LUNGS:  No crackles or wheezing audible.  Respirations even and unlabored.  Some minimal increased cough with forced expiration.   RADIAL PULSE:  Equal bilaterally.   ABDOMEN:  Soft, nontender.  Bowel sounds present and normal.  No  audible abdominal bruit.   EXTREMITIES:  No increased edema present.  DP pulses palpable and equal bilaterally.      FEET:  No lesions.       Assessment & Plan:  HEALTH MAINTENANCE.  Physical 12/03/13  Colonoscopy 01/27/09 - diverticulosis otherwise normal.  Mammogram 01/29/14 - Birads I.     I spent 25 minutes with the patient and more than 50% of the time was spent in consultation regarding the above.

## 2014-02-24 NOTE — Assessment & Plan Note (Signed)
Discussed diet, exercise and weight loss

## 2014-02-24 NOTE — Assessment & Plan Note (Signed)
Saw Dr Ma Hillock.  Follow cbc.  Recent hgb 12/03/13 - 12.3.  B12 wnl.

## 2014-02-24 NOTE — Assessment & Plan Note (Signed)
Increased stress as outlined.  Seeing psychiatry.  Effexor dose just increased.  Follow.

## 2014-02-24 NOTE — Assessment & Plan Note (Signed)
On crestor and tolerating.  Follow lipid panel and liver function.

## 2014-03-04 ENCOUNTER — Encounter: Payer: Self-pay | Admitting: Internal Medicine

## 2014-03-04 ENCOUNTER — Other Ambulatory Visit (INDEPENDENT_AMBULATORY_CARE_PROVIDER_SITE_OTHER): Payer: BC Managed Care – PPO

## 2014-03-04 DIAGNOSIS — E7800 Pure hypercholesterolemia, unspecified: Secondary | ICD-10-CM

## 2014-03-04 DIAGNOSIS — R739 Hyperglycemia, unspecified: Secondary | ICD-10-CM

## 2014-03-04 DIAGNOSIS — R7309 Other abnormal glucose: Secondary | ICD-10-CM

## 2014-03-04 DIAGNOSIS — I10 Essential (primary) hypertension: Secondary | ICD-10-CM

## 2014-03-04 LAB — BASIC METABOLIC PANEL
BUN: 11 mg/dL (ref 6–23)
CO2: 28 meq/L (ref 19–32)
Calcium: 8.9 mg/dL (ref 8.4–10.5)
Chloride: 107 mEq/L (ref 96–112)
Creatinine, Ser: 1 mg/dL (ref 0.4–1.2)
GFR: 62.21 mL/min (ref >60.00)
GLUCOSE: 99 mg/dL (ref 70–99)
Potassium: 4.2 mEq/L (ref 3.5–5.1)
SODIUM: 141 meq/L (ref 135–145)

## 2014-03-04 LAB — HEPATIC FUNCTION PANEL
ALBUMIN: 3.7 g/dL (ref 3.5–5.2)
ALT: 15 U/L (ref 0–35)
AST: 17 U/L (ref 0–37)
Alkaline Phosphatase: 85 U/L (ref 39–117)
Bilirubin, Direct: 0 mg/dL (ref 0.0–0.3)
Total Bilirubin: 0.4 mg/dL (ref 0.2–1.2)
Total Protein: 7.3 g/dL (ref 6.0–8.3)

## 2014-03-04 LAB — LIPID PANEL
Cholesterol: 175 mg/dL (ref 0–200)
HDL: 48.9 mg/dL (ref >39.00)
LDL Cholesterol: 94 mg/dL (ref 0–99)
NonHDL: 126.1
Total CHOL/HDL Ratio: 4
Triglycerides: 159 mg/dL — ABNORMAL HIGH (ref 0.0–149.0)
VLDL: 31.8 mg/dL (ref 0.0–40.0)

## 2014-03-04 LAB — HEMOGLOBIN A1C: Hgb A1c MFr Bld: 5.9 % (ref 4.6–6.5)

## 2014-03-06 ENCOUNTER — Encounter: Payer: Self-pay | Admitting: Internal Medicine

## 2014-03-31 ENCOUNTER — Other Ambulatory Visit: Payer: Self-pay | Admitting: Internal Medicine

## 2014-04-11 ENCOUNTER — Encounter: Payer: Self-pay | Admitting: Internal Medicine

## 2014-04-28 ENCOUNTER — Other Ambulatory Visit: Payer: Self-pay | Admitting: Internal Medicine

## 2014-05-21 ENCOUNTER — Ambulatory Visit (INDEPENDENT_AMBULATORY_CARE_PROVIDER_SITE_OTHER): Payer: BC Managed Care – PPO | Admitting: Internal Medicine

## 2014-05-21 ENCOUNTER — Encounter: Payer: Self-pay | Admitting: Internal Medicine

## 2014-05-21 ENCOUNTER — Ambulatory Visit: Payer: Self-pay | Admitting: Internal Medicine

## 2014-05-21 VITALS — BP 120/80 | HR 83 | Temp 97.8°F | Ht 65.0 in | Wt 243.8 lb

## 2014-05-21 DIAGNOSIS — E7800 Pure hypercholesterolemia, unspecified: Secondary | ICD-10-CM

## 2014-05-21 DIAGNOSIS — Z658 Other specified problems related to psychosocial circumstances: Secondary | ICD-10-CM

## 2014-05-21 DIAGNOSIS — D75839 Thrombocytosis, unspecified: Secondary | ICD-10-CM

## 2014-05-21 DIAGNOSIS — R519 Headache, unspecified: Secondary | ICD-10-CM

## 2014-05-21 DIAGNOSIS — K219 Gastro-esophageal reflux disease without esophagitis: Secondary | ICD-10-CM

## 2014-05-21 DIAGNOSIS — I10 Essential (primary) hypertension: Secondary | ICD-10-CM

## 2014-05-21 DIAGNOSIS — F439 Reaction to severe stress, unspecified: Secondary | ICD-10-CM

## 2014-05-21 DIAGNOSIS — E669 Obesity, unspecified: Secondary | ICD-10-CM

## 2014-05-21 DIAGNOSIS — D649 Anemia, unspecified: Secondary | ICD-10-CM

## 2014-05-21 DIAGNOSIS — R739 Hyperglycemia, unspecified: Secondary | ICD-10-CM

## 2014-05-21 DIAGNOSIS — D473 Essential (hemorrhagic) thrombocythemia: Secondary | ICD-10-CM

## 2014-05-21 DIAGNOSIS — J329 Chronic sinusitis, unspecified: Secondary | ICD-10-CM

## 2014-05-21 DIAGNOSIS — R51 Headache: Secondary | ICD-10-CM

## 2014-05-21 MED ORDER — FLUTICASONE-SALMETEROL 250-50 MCG/DOSE IN AEPB: 1.0000 | INHALATION_SPRAY | Freq: Two times a day (BID) | RESPIRATORY_TRACT | Status: DC

## 2014-05-21 MED ORDER — VENLAFAXINE HCL ER 150 MG PO CP24: 150.0000 mg | ORAL_CAPSULE | Freq: Every day | ORAL | Status: DC

## 2014-05-21 MED ORDER — ETODOLAC 400 MG PO TABS: ORAL_TABLET | ORAL | Status: DC

## 2014-05-21 NOTE — Progress Notes (Signed)
Pre visit review using our clinic review tool, if applicable. No additional management support is needed unless otherwise documented below in the visit note. 

## 2014-05-22 ENCOUNTER — Telehealth: Payer: Self-pay | Admitting: Internal Medicine

## 2014-05-22 NOTE — Telephone Encounter (Signed)
Pt notified of cxr results via my chart.

## 2014-05-26 ENCOUNTER — Encounter: Payer: Self-pay | Admitting: Internal Medicine

## 2014-05-26 NOTE — Assessment & Plan Note (Signed)
Low carb diet, exercise and weight loss.  Follow.  Follow metabolic panel and O7S.

## 2014-05-26 NOTE — Progress Notes (Signed)
Subjective:    Patient ID: Nicole Sims, female    DOB: [birth year], 63 y.o.   MRN: [id number]  HPI 63 year old female with past history of depression requiring hospitalization x 2.  She also has known reflux, IBS, tachycardia, hypercholesterolemia and headaches.  She comes in today for a scheduled follow up.   Has been under more stress recently.  See previous notes for details.   She is on effexor.  Just recently increased to 150mg  q day.  Has been seeing psychiatry.   No nausea or vomiting.  No significant bowel change.   Has been having some cough and congestion.  Some better now.  Still with some congestion and some persistent cough.   Some headaches.  Request refill for etodolac.  Off mobic.  Had hot flashes on celebrex.  Did not follow through with her cardiology appt.  Still with some sob with exertion.     Past Medical History  Diagnosis Date  . Hypertension   . Hypercholesterolemia   . Palpitations   . Depression   . GERD (gastroesophageal reflux disease)   . Environmental allergies   . Urinary incontinence     mixed  . Osteoarthritis   . Diverticulosis   . Anemia   . Thrombocytosis   . Chronic headaches   . Hyperglycemia   . Asthma     Current Outpatient Prescriptions on File Prior to Visit  Medication Sig Dispense Refill  . albuterol (PROVENTIL HFA;VENTOLIN HFA) 108 (90 BASE) MCG/ACT inhaler Inhale 2 puffs into the lungs every 4 (four) hours as needed.  1 Inhaler  3  . calcium gluconate 500 MG tablet Take 500 mg by mouth 2 (two) times daily.      . CRESTOR 5 MG tablet TAKE 1 TABLET BY MOUTH DAILY  30 tablet  5  . cyclobenzaprine (FLEXERIL) 10 MG tablet Take 10 mg by mouth at bedtime as needed.       . fluticasone (FLONASE) 50 MCG/ACT nasal spray INSTILL 2 SPRAYS IN EACH NOSTRIL EVERY DAY  16 g  2  . metoprolol succinate (TOPROL-XL) 50 MG 24 hr tablet TAKE 1 TABLET BY MOUTH EVERY DAY WITH OR IMMEDIATELY FOLLOWING A MEAL  30 tablet  5  . Multiple Vitamin (MULTIVITAMIN)  tablet Take 1 tablet by mouth daily.      Marland Kitchen NEXIUM 40 MG capsule TAKE 1 CAPSULE BY MOUTH EVERY DAY BEFORE BREAKFAST  30 capsule  5  . oxyCODONE (OXY IR/ROXICODONE) 5 MG immediate release tablet Take 5 mg by mouth 2 (two) times daily as needed.      . ranitidine (ZANTAC) 150 MG tablet Take 150 mg by mouth at bedtime.       No current facility-administered medications on file prior to visit.    Review of Systems having some issues with headache.   Some increased sinus congestion as outlined.  No sore throat now.  Still with drainage and cough.   No nausea or vomiting.  No increased acid reflux reported. No abdominal pain or cramping.  No bowel change, such as constipation, BRBPR or melana.  No urine change. Increased stress.  Seeing psychiatry.  Now on effexor.  Dose just increased.   Also taking crestor now.  Tolerating.  Cholesterol much improved.          Objective:   Physical Exam  Filed Vitals:   05/21/14 1357  BP: 120/80  Pulse: 83  Temp: 97.8 F (78.71 C)   63 year old female  in no acute distress.   HEENT:  Nares- slightly erythematous turbinates.  Oropharynx - without lesions.   NECK:  Supple.  Nontender.  No audible bruit.  HEART:  Appears to be regular. LUNGS:  No crackles or wheezing audible.  Respirations even and unlabored.   RADIAL PULSE:  Equal bilaterally.   ABDOMEN:  Soft, nontender.  Bowel sounds present and normal.  No audible abdominal bruit.   EXTREMITIES:  No increased edema present.  DP pulses palpable and equal bilaterally.      FEET:  No lesions.       Assessment & Plan:  CARDIOLOGY.  Had issues with increased heart rate.  Some sob with exertion.  She did not keep her cardiology appt.  Plans to reschedule.  Wants to hold on any further testing at this time.    HEALTH MAINTENANCE.  Physical 12/03/13  Colonoscopy 01/27/09 - diverticulosis otherwise normal.  Mammogram 01/29/14 - Birads I.     I spent 25 minutes with the patient and more than 50% of the time was  spent in consultation regarding the above.

## 2014-05-26 NOTE — Assessment & Plan Note (Signed)
On crestor and tolerating.  Follow lipid panel and liver function.

## 2014-05-26 NOTE — Assessment & Plan Note (Signed)
Saw Dr Ma Hillock.  Follow cbc.  B12 wnl.

## 2014-05-26 NOTE — Assessment & Plan Note (Signed)
Not reported as an issue today.  Follow.   

## 2014-05-26 NOTE — Assessment & Plan Note (Signed)
Blood pressure as outlined.  On Toprol XL 50mg  q day.   Blood pressure doing better.  Follow.

## 2014-05-26 NOTE — Assessment & Plan Note (Signed)
Some persistent congestion and cough.  Saline nasal spray and nasacort as directed.  mucinex and robitussin as directed.  Check cxr given persistent cough.  Follow.  Hold abx.

## 2014-05-26 NOTE — Assessment & Plan Note (Signed)
Have discussed diet, exercise and weight loss.   

## 2014-05-26 NOTE — Assessment & Plan Note (Signed)
Increased stress as outlined.  Seeing psychiatry.  Effexor dose just increased.  Follow.

## 2014-05-26 NOTE — Assessment & Plan Note (Signed)
Was evaluated by Dr Ma Hillock.  Spleen ok. Follow platelet count.

## 2014-05-26 NOTE — Assessment & Plan Note (Signed)
Was on topamax.  Headaches were better.  Has declined a follow up appt with Dr Domingo Cocking.   Off Topamax now.   Some headaches recently, but has had congestion.  See if can get the congestion clear and see if headaches improve/resolve.

## 2014-05-29 ENCOUNTER — Encounter: Payer: Self-pay | Admitting: Internal Medicine

## 2014-05-29 ENCOUNTER — Other Ambulatory Visit: Payer: Self-pay | Admitting: *Deleted

## 2014-05-29 MED ORDER — ESOMEPRAZOLE MAGNESIUM 40 MG PO CPDR: DELAYED_RELEASE_CAPSULE | ORAL | Status: DC

## 2014-07-02 ENCOUNTER — Encounter: Payer: Self-pay | Admitting: Internal Medicine

## 2014-07-30 ENCOUNTER — Other Ambulatory Visit (INDEPENDENT_AMBULATORY_CARE_PROVIDER_SITE_OTHER): Payer: BC Managed Care – PPO

## 2014-07-30 DIAGNOSIS — E7800 Pure hypercholesterolemia, unspecified: Secondary | ICD-10-CM

## 2014-07-30 DIAGNOSIS — R739 Hyperglycemia, unspecified: Secondary | ICD-10-CM

## 2014-07-30 DIAGNOSIS — D649 Anemia, unspecified: Secondary | ICD-10-CM

## 2014-07-30 DIAGNOSIS — I10 Essential (primary) hypertension: Secondary | ICD-10-CM

## 2014-07-30 LAB — BASIC METABOLIC PANEL
BUN: 16 mg/dL (ref 6–23)
CO2: 22 mEq/L (ref 19–32)
Calcium: 9.3 mg/dL (ref 8.4–10.5)
Chloride: 111 mEq/L (ref 96–112)
Creatinine, Ser: 1.1 mg/dL (ref 0.4–1.2)
GFR: 50.96 mL/min — AB (ref >60.00)
GLUCOSE: 99 mg/dL (ref 70–99)
POTASSIUM: 4.7 meq/L (ref 3.5–5.1)
Sodium: 142 mEq/L (ref 135–145)

## 2014-07-30 LAB — HEPATIC FUNCTION PANEL
ALK PHOS: 96 U/L (ref 39–117)
ALT: 22 U/L (ref 0–35)
AST: 22 U/L (ref 0–37)
Albumin: 3.8 g/dL (ref 3.5–5.2)
BILIRUBIN DIRECT: 0.1 mg/dL (ref 0.0–0.3)
TOTAL PROTEIN: 7.2 g/dL (ref 6.0–8.3)
Total Bilirubin: 0.4 mg/dL (ref 0.2–1.2)

## 2014-07-30 LAB — LIPID PANEL
CHOL/HDL RATIO: 4
CHOLESTEROL: 197 mg/dL (ref 0–200)
HDL: 50.2 mg/dL (ref >39.00)
LDL Cholesterol: 122 mg/dL — ABNORMAL HIGH (ref 0–99)
NonHDL: 146.8
TRIGLYCERIDES: 124 mg/dL (ref 0.0–149.0)
VLDL: 24.8 mg/dL (ref 0.0–40.0)

## 2014-07-30 LAB — CBC WITH DIFFERENTIAL/PLATELET
BASOS ABS: 0 10*3/uL (ref 0.0–0.1)
BASOS PCT: 0.6 % (ref 0.0–3.0)
EOS ABS: 0.2 10*3/uL (ref 0.0–0.7)
Eosinophils Relative: 2.5 % (ref 0.0–5.0)
HCT: 35 % — ABNORMAL LOW (ref 36.0–46.0)
Hemoglobin: 11.3 g/dL — ABNORMAL LOW (ref 12.0–15.0)
Lymphocytes Relative: 33.3 % (ref 12.0–46.0)
Lymphs Abs: 2.8 10*3/uL (ref 0.7–4.0)
MCHC: 32.2 g/dL (ref 30.0–36.0)
MCV: 85.5 fl (ref 78.0–100.0)
Monocytes Absolute: 0.6 10*3/uL (ref 0.1–1.0)
Monocytes Relative: 7.1 % (ref 3.0–12.0)
NEUTROS PCT: 56.5 % (ref 43.0–77.0)
Neutro Abs: 4.8 10*3/uL (ref 1.4–7.7)
PLATELETS: 458 10*3/uL — AB (ref 150.0–400.0)
RBC: 4.09 Mil/uL (ref 3.87–5.11)
RDW: 14.8 % (ref 11.5–15.5)
WBC: 8.4 10*3/uL (ref 4.0–10.5)

## 2014-07-30 LAB — HEMOGLOBIN A1C: Hgb A1c MFr Bld: 6.3 % (ref 4.6–6.5)

## 2014-07-30 LAB — FERRITIN: Ferritin: 31.2 ng/mL (ref 10.0–291.0)

## 2014-08-01 ENCOUNTER — Encounter: Payer: Self-pay | Admitting: Internal Medicine

## 2014-08-05 ENCOUNTER — Ambulatory Visit (INDEPENDENT_AMBULATORY_CARE_PROVIDER_SITE_OTHER): Payer: BC Managed Care – PPO | Admitting: Internal Medicine

## 2014-08-05 ENCOUNTER — Encounter: Payer: Self-pay | Admitting: Internal Medicine

## 2014-08-05 VITALS — BP 130/80 | HR 74 | Temp 97.6°F | Ht 65.0 in | Wt 250.0 lb

## 2014-08-05 DIAGNOSIS — R739 Hyperglycemia, unspecified: Secondary | ICD-10-CM

## 2014-08-05 DIAGNOSIS — D649 Anemia, unspecified: Secondary | ICD-10-CM

## 2014-08-05 DIAGNOSIS — M1990 Unspecified osteoarthritis, unspecified site: Secondary | ICD-10-CM

## 2014-08-05 DIAGNOSIS — D473 Essential (hemorrhagic) thrombocythemia: Secondary | ICD-10-CM

## 2014-08-05 DIAGNOSIS — Z658 Other specified problems related to psychosocial circumstances: Secondary | ICD-10-CM

## 2014-08-05 DIAGNOSIS — F439 Reaction to severe stress, unspecified: Secondary | ICD-10-CM

## 2014-08-05 DIAGNOSIS — K219 Gastro-esophageal reflux disease without esophagitis: Secondary | ICD-10-CM

## 2014-08-05 DIAGNOSIS — D75839 Thrombocytosis, unspecified: Secondary | ICD-10-CM

## 2014-08-05 DIAGNOSIS — J329 Chronic sinusitis, unspecified: Secondary | ICD-10-CM

## 2014-08-05 DIAGNOSIS — E7800 Pure hypercholesterolemia, unspecified: Secondary | ICD-10-CM

## 2014-08-05 DIAGNOSIS — I10 Essential (primary) hypertension: Secondary | ICD-10-CM

## 2014-08-05 MED ORDER — LEVOFLOXACIN 500 MG PO TABS: 500.0000 mg | ORAL_TABLET | Freq: Every day | ORAL | Status: DC

## 2014-08-05 NOTE — Patient Instructions (Addendum)
Saline nasal spray - flush nose at least 2-3x/day  nasacort nasal spray - 2 sprays each nostril one time per day.  Do this in the evening.    Mucinex in the am and robitussin in the evening.    Take a probiotic daily while on the antibiotics and for two more weeks.

## 2014-08-05 NOTE — Progress Notes (Signed)
Subjective:    Patient ID: Nicole Sims, female    DOB: [birth year], 63 y.o.   MRN: [id number]  HPI 63 year old female with past history of depression requiring hospitalization x 2.  She also has known reflux, IBS, tachycardia, hypercholesterolemia and headaches.  She comes in today for a scheduled follow up.   Has been under more stress recently.  See previous notes for details.   She is on effexor.  Just recently increased to $RemoveBefo'150mg'GtbkPHuqrYn$  q day.  Has been seeing psychiatry.  Appears to be doing some better.   No nausea or vomiting.  No significant bowel change.  She saw Dr Rudene Christians for her leg pain (right leg).  On Daypro.  Pain is better.  We discussed since pain better, to decrease NSAID intake.  Therapy.  Continue exercise.  Breathing stable.     Past Medical History  Diagnosis Date  . Hypertension   . Hypercholesterolemia   . Palpitations   . Depression   . GERD (gastroesophageal reflux disease)   . Environmental allergies   . Urinary incontinence     mixed  . Osteoarthritis   . Diverticulosis   . Anemia   . Thrombocytosis   . Chronic headaches   . Hyperglycemia   . Asthma     Current Outpatient Prescriptions on File Prior to Visit  Medication Sig Dispense Refill  . albuterol (PROVENTIL HFA;VENTOLIN HFA) 108 (90 BASE) MCG/ACT inhaler Inhale 2 puffs into the lungs every 4 (four) hours as needed. 1 Inhaler 3  . calcium gluconate 500 MG tablet Take 500 mg by mouth 2 (two) times daily.    . cetirizine (ZYRTEC) 10 MG tablet Take 10 mg by mouth daily.    . CRESTOR 5 MG tablet TAKE 1 TABLET BY MOUTH DAILY 30 tablet 5  . cyclobenzaprine (FLEXERIL) 10 MG tablet Take 10 mg by mouth at bedtime as needed.     Marland Kitchen esomeprazole (NEXIUM) 40 MG capsule TAKE 1 CAPSULE BY MOUTH TWICE DAILY 60 capsule 5  . fluticasone (FLONASE) 50 MCG/ACT nasal spray INSTILL 2 SPRAYS IN EACH NOSTRIL EVERY DAY 16 g 2  . Fluticasone-Salmeterol (ADVAIR DISKUS) 250-50 MCG/DOSE AEPB Inhale 1 puff into the lungs 2 (two) times  daily. 60 each 2  . gabapentin (NEURONTIN) 100 MG capsule Take 100 mg by mouth 3 (three) times daily as needed.    . metoprolol succinate (TOPROL-XL) 50 MG 24 hr tablet TAKE 1 TABLET BY MOUTH EVERY DAY WITH OR IMMEDIATELY FOLLOWING A MEAL 30 tablet 5  . Multiple Vitamin (MULTIVITAMIN) tablet Take 1 tablet by mouth daily.    Marland Kitchen oxyCODONE (OXY IR/ROXICODONE) 5 MG immediate release tablet Take 5 mg by mouth 2 (two) times daily as needed.    . ranitidine (ZANTAC) 150 MG tablet Take 150 mg by mouth at bedtime.    Marland Kitchen venlafaxine XR (EFFEXOR-XR) 150 MG 24 hr capsule Take 1 capsule (150 mg total) by mouth daily with breakfast. 30 capsule 3   No current facility-administered medications on file prior to visit.    Review of Systems Reports increased sinus congestion and pressure.  Persistent symptoms.  Increased mucus production.   No nausea or vomiting.  No increased acid reflux reported. No abdominal pain or cramping.  No bowel change, such as constipation, BRBPR or melana.  No urine change. Increased stress.  Seeing psychiatry.  On effexor.  Dose Taking crestor now.  Tolerating.  Cholesterol improved.   Seeing Dr Rudene Christians for her leg.  On daypro.  We discussed side effects.  hopefully can taper dose.  On $R'1200mg'Jj$  q day.  Discussed diet and exercise.        Objective:   Physical Exam  Filed Vitals:   08/05/14 1432  BP: 130/80  Pulse: 74  Temp: 97.6 F (24.79 C)   63 year old female in no acute distress.   HEENT:  Nares- slightly erythematous turbinates.  Oropharynx - without lesions.   NECK:  Supple.  Nontender.  No audible bruit.  HEART:  Appears to be regular. LUNGS:  No crackles or wheezing audible.  Respirations even and unlabored.   RADIAL PULSE:  Equal bilaterally.   ABDOMEN:  Soft, nontender.  Bowel sounds present and normal.  No audible abdominal bruit.   EXTREMITIES:  No increased edema present.  DP pulses palpable and equal bilaterally.      FEET:  No lesions.       Assessment & Plan:   1. Anemia, unspecified anemia type Recent hgb slightly decreased.   - CBC with Differential - Vitamin B12 - Ferritin - IBC panel  2. Severe obesity (BMI >= 40) Diet and exercise.    3. Essential hypertension Blood pressure has been doing well.  Follow.    4. Gastroesophageal reflux disease, esophagitis presence not specified Symptoms controlled.    5. Osteoarthritis, unspecified osteoarthritis type, unspecified site Saw Dr Rudene Christians.  On daypro.  See if can decrease the dose.  Follow.    6. Thrombocytosis Recheck cbc today.    7. Hypercholesterolemia On crestor and tolerating.  Low cholesterol diet and exercise.  Follow lipid panel and liver function.  Lab Results  Component Value Date   CHOL 197 07/30/2014   HDL 50.20 07/30/2014   LDLCALC 122* 07/30/2014   LDLDIRECT 172.8 09/17/2013   TRIG 124.0 07/30/2014   CHOLHDL 4 07/30/2014   8. Hyperglycemia Low carb diet.  Exercise and weight loss.  Follow met b and a1c.  Lab Results  Component Value Date   HGBA1C 6.3 07/30/2014   9. Stress Seeing psychiatry.  On effexor.  Appears to be doing better.    10. Sinusitis, unspecified chronicity, unspecified location Treat with levaquin as directed.  Mucinex, saline nasal spray and steroid nasal spray as directed.  Follow.    11. CARDIOLOGY.  Had issues with increased heart rate.  Some sob with exertion.  She did not keep her cardiology appt previously.  Appears to be doing better.    Wants to hold on any further testing at this time.    HEALTH MAINTENANCE.  Physical 12/03/13  Colonoscopy 01/27/09 - diverticulosis otherwise normal.  Mammogram 01/29/14 - Birads I.     I spent 25 minutes with the patient and more than 50% of the time was spent in consultation regarding the above.

## 2014-08-05 NOTE — Progress Notes (Signed)
Pre visit review using our clinic review tool, if applicable. No additional management support is needed unless otherwise documented below in the visit note. 

## 2014-08-06 LAB — CBC WITH DIFFERENTIAL/PLATELET
BASOS ABS: 0 10*3/uL (ref 0.0–0.1)
Basophils Relative: 0.4 % (ref 0.0–3.0)
Eosinophils Absolute: 0.3 10*3/uL (ref 0.0–0.7)
Eosinophils Relative: 2.8 % (ref 0.0–5.0)
HEMATOCRIT: 36.7 % (ref 36.0–46.0)
HEMOGLOBIN: 11.7 g/dL — AB (ref 12.0–15.0)
LYMPHS ABS: 3.6 10*3/uL (ref 0.7–4.0)
LYMPHS PCT: 35.9 % (ref 12.0–46.0)
MCHC: 31.9 g/dL (ref 30.0–36.0)
MCV: 86 fl (ref 78.0–100.0)
Monocytes Absolute: 0.6 10*3/uL (ref 0.1–1.0)
Monocytes Relative: 5.7 % (ref 3.0–12.0)
NEUTROS ABS: 5.6 10*3/uL (ref 1.4–7.7)
Neutrophils Relative %: 55.2 % (ref 43.0–77.0)
Platelets: 489 10*3/uL — ABNORMAL HIGH (ref 150.0–400.0)
RBC: 4.27 Mil/uL (ref 3.87–5.11)
RDW: 14.9 % (ref 11.5–15.5)
WBC: 10.1 10*3/uL (ref 4.0–10.5)

## 2014-08-06 LAB — FERRITIN: Ferritin: 24.9 ng/mL (ref 10.0–291.0)

## 2014-08-06 LAB — IBC PANEL
Iron: 65 ug/dL (ref 42–145)
Saturation Ratios: 15.4 % — ABNORMAL LOW (ref 20.0–50.0)
TRANSFERRIN: 301.3 mg/dL (ref 212.0–360.0)

## 2014-08-06 LAB — VITAMIN B12: VITAMIN B 12: 394 pg/mL (ref 211–911)

## 2014-08-08 ENCOUNTER — Other Ambulatory Visit: Payer: Self-pay | Admitting: Internal Medicine

## 2014-08-08 DIAGNOSIS — D649 Anemia, unspecified: Secondary | ICD-10-CM

## 2014-08-08 NOTE — Progress Notes (Signed)
Order placed for f/u cbc.   

## 2014-08-11 ENCOUNTER — Encounter: Payer: Self-pay | Admitting: Internal Medicine

## 2014-08-11 DIAGNOSIS — Z6841 Body Mass Index (BMI) 40.0 and over, adult: Secondary | ICD-10-CM | POA: Insufficient documentation

## 2014-08-22 ENCOUNTER — Encounter: Payer: Self-pay | Admitting: *Deleted

## 2014-08-22 NOTE — Telephone Encounter (Signed)
From: [name] Sent: 05/05/2014 3:40 PM EDT To: Aviva Kluver Subject: RE:Flu Shot Clinic Saturday May 11, 2014  I have already received a flu shot from [facility]. Thank you!  ----- Message ----- From: Genia Plants Sent: 05/04/2014 6:37 PM EDT To: [name] Subject: Flu Shot Clinic Saturday May 11, 2014  [facility] will hold a [facility] on Saturday, September 26 from 9 am to noon. In order to plan appropriately, we ask you to please call the office to schedule an appointment time during the clinic. Our schedulers are ready to assist you, Monday through Friday, 8 am to 5 pm at [phone]. Thank you for choosing [facility] for your healthcare needs.

## 2014-09-09 ENCOUNTER — Encounter: Payer: Self-pay | Admitting: Internal Medicine

## 2014-09-09 ENCOUNTER — Other Ambulatory Visit (INDEPENDENT_AMBULATORY_CARE_PROVIDER_SITE_OTHER): Payer: BLUE CROSS/BLUE SHIELD

## 2014-09-09 DIAGNOSIS — D649 Anemia, unspecified: Secondary | ICD-10-CM

## 2014-09-09 LAB — CBC WITH DIFFERENTIAL/PLATELET
BASOS PCT: 0.6 % (ref 0.0–3.0)
Basophils Absolute: 0.1 10*3/uL (ref 0.0–0.1)
EOS ABS: 0.2 10*3/uL (ref 0.0–0.7)
Eosinophils Relative: 2.6 % (ref 0.0–5.0)
HEMATOCRIT: 35.8 % — AB (ref 36.0–46.0)
HEMOGLOBIN: 11.9 g/dL — AB (ref 12.0–15.0)
LYMPHS ABS: 3.3 10*3/uL (ref 0.7–4.0)
Lymphocytes Relative: 36.6 % (ref 12.0–46.0)
MCHC: 33.3 g/dL (ref 30.0–36.0)
MCV: 83.4 fl (ref 78.0–100.0)
MONO ABS: 0.7 10*3/uL (ref 0.1–1.0)
Monocytes Relative: 7.6 % (ref 3.0–12.0)
NEUTROS ABS: 4.8 10*3/uL (ref 1.4–7.7)
NEUTROS PCT: 52.6 % (ref 43.0–77.0)
PLATELETS: 428 10*3/uL — AB (ref 150.0–400.0)
RBC: 4.29 Mil/uL (ref 3.87–5.11)
RDW: 14.4 % (ref 11.5–15.5)
WBC: 9.1 10*3/uL (ref 4.0–10.5)

## 2014-09-10 ENCOUNTER — Other Ambulatory Visit: Payer: BC Managed Care – PPO

## 2014-09-18 ENCOUNTER — Other Ambulatory Visit: Payer: Self-pay | Admitting: Internal Medicine

## 2014-10-15 ENCOUNTER — Other Ambulatory Visit: Payer: Self-pay | Admitting: Internal Medicine

## 2014-11-13 ENCOUNTER — Other Ambulatory Visit: Payer: Self-pay | Admitting: Internal Medicine

## 2014-12-06 NOTE — Op Note (Signed)
PATIENT NAME:  Nicole Sims, Nicole Sims A MR#:  [id number] DATE OF BIRTH:  [birth year]  DATE OF PROCEDURE:  07/10/2013  PREOPERATIVE DIAGNOSIS: Right knee osteoarthritis.   POSTOPERATIVE DIAGNOSIS: Right knee osteoarthritis.   PROCEDURE: Right total knee replacement.   SURGEON: Hessie Knows, M.D.   ASSISTANT:  Rachelle Hora, PA-C.   DESCRIPTION OF PROCEDURE: The patient was brought to the operating room and after attempted spinal anesthesia was unsuccessful, general anesthesia was obtained, and the patient was in a supine position with the right leg prepped and draped in the usual sterile fashion. A tourniquet was applied to the upper thigh. After patient identification and timeout procedures were completed, after having prepped and draped the leg, the tourniquet was raised to 300 mmHg after exsanguinating the limb. Anterior midline skin incision was made, followed by medial parapatellar arthrotomy. Inspection revealed severe medial compartment arthrosis with exposed bone on femoral and tibial sides, advanced patellar degenerative change, particularly of the lateral facet with bone exposed on both sides of the joint and mild lateral compartment arthritis. The ACL and fat pad were excised, and the proximal tibia was exposed to allow for application of the [place] cutting guide. The tibial cut was made and bone removed along with the anterior horns of the menisci. The femoral cutting block was then applied after denuding cartilage on the lateral side of the femur, and the distal femoral cut made, cuts matching the bony template. The 4 cutting guide was applied; anterior, posterior and chamfer cuts made. At this point, the residual posterior horns of the menisci could be excised. The 3 tibia gave good coverage and was pinned in place with the appropriate rotation based on a pin from the initial cutting block. Proximal drilling was carried out and then the keel punch placed and femoral trial placed. A 10 mm implant gave  excellent stability and full extension. The notch cut for the trochlear groove was then created in the distal femur, and the patellar cut with the patellar guide and the patella sized to a size 2. All of the components were removed at this point and the knee irrigated. Exparel and a combination of morphine, Toradol and Sensorcaine were infiltrated in the periarticular tissue for postoperative analgesia. The bony surfaces again thoroughly irrigated and dried. The tibial component was cemented into place first, followed by the 10 mm polyethylene insert with screw as well. The femoral component was cemented and the knee held in extension as the cement set. The patellar button was clamped into place, again using cement. After the cement had set, the knee was thoroughly irrigated. Excess cement removed. The patella tracked well with no touch technique. Tourniquet was lowered, and there was no excessive bleeding. The arthrotomy was repaired using a heavy quill suture, 2-0 quill subcutaneously and skin staples. Xeroform, 4 x 4's, ABD, Webril, Ace wrap and Polar Care, along with a knee immobilizer applied.   ESTIMATED BLOOD LOSS: 100 mL.   TOURNIQUET TIME: 59 minutes at 300 mmHg.   There were no complications.   SPECIMEN: Cut ends of bone.   IMPLANTS: GMK sphere primary tibial insert, size 4 right 10 mm, a right cemented GMK sphere femoral component with a size 2 patellar component, and a tibial tray fixed, cemented T3-I4 right fixed component.     ____________________________ Laurene Footman, MD mjm:dmm D: 07/10/2013 21:23:09 ET T: 07/10/2013 21:39:38 ET JOB#: [id number]  cc: Laurene Footman, MD, <Dictator> Laurene Footman MD ELECTRONICALLY SIGNED 07/11/2013 8:03

## 2014-12-06 NOTE — Discharge Summary (Signed)
PATIENT NAME:  Nicole Sims, Nicole Sims A MR#:  [id number] DATE OF BIRTH:  29-Sep-1950  DATE OF ADMISSION:  07/10/2013 DATE OF DISCHARGE:  07/13/2013  ADMITTING DIAGNOSIS: Right knee osteoarthritis.   DISCHARGE DIAGNOSIS: Right knee osteoarthritis.   PROCEDURE: Right total knee replacement.   SURGEON: Laurene Footman, M.D.   ASSISTANT: Rachelle Hora, PA-C   ESTIMATED BLOOD LOSS: 100 mL.  TOURNIQUET TIME: 59 minutes at 300 mmHg.  COMPLICATIONS: None.   SPECIMEN: Cut ends of bone.   IMPLANTS: GMK sphere primary tibial insert size 4, right 10 mm, a right cemented GMK sphere femoral component with a size 2 patellar component and a tibial tray fixed cemented to 3 out of 4 right fixed component.   HISTORY: The patient is a 64 year old female, who has had  a long history of right knee osteoarthritis treated nonoperatively with injection and therapy. She has had a prior left knee replacement that was successful. She continues to have significant pain at night, pain with any weight-bearing on the right knee. The pain is interfering with activities of daily living.   PHYSICAL EXAMINATION:  LUNGS: Clear to auscultation.  HEART: Regular rate and rhythm.  HEENT: Normal.  EXTREMITIES: Right knee: Examination of the right knee shows the patient has 0 to 115 degrees range of motion. Patellofemoral medial compartment crepitation. She has a varus deformity that is mostly at passively correctable. She has a Baker's cyst present. Skin is intact distally. She has a strong dorsalis pedis and posterior tibialis pulse without edema.   HOSPITAL COURSE: The patient was admitted to the hospital on July 10, 2013. She had surgery that same day and was brought to the orthopedic floor from the PACU in stable condition. On postoperative day 1, the patient was doing well. Labs were stable. Her vital signs were stable. She had good progress with physical therapy. On postoperative day 2 and 3, the patient continued to progress  well with physical therapy. Her vital signs were stable. The patient did have low-grade fevers at nighttime, but she remained afebrile throughout the day. She did continue to use incentive spirometry. Breath sounds were normal. No wheezing, rales or rhonchi. By July 13, 2013, postoperative day 3, the patient was able to ambulate 200 feet and stairs. Vital signs were stable. She was afebrile and she was ready for discharge.   DISCHARGE INSTRUCTIONS: The patient can increase weight-bearing on the affected extremity. She is to elevate the affected foot or leg on 1 or 2 pillows with the foot higher than the knee. Thigh-high TED hose on both legs and remove at bedtime. Replace when arising the next morning. Elevate the heels off the bed. Use incentive spirometry every hour while awake and encourage cough and deep breathing. She may resume a regular diet as tolerated. Apply an ice pack to the affected and continue using Polar Care unit, maintaining a temperature between 40 and 50 degrees. Leave the dressing on. Call [facility] orthopedics if any of the following occur: Bright red bleeding from the incision wound, fever above 101.5 degrees, redness, swelling, or drainage at the incision. Call [facility] orthopedics if you experience any increased leg pain, numbness or weakness in legs or bowel or bladder symptoms. Home physical therapy has been arranged for continuation of rehab program. Please call me [facility] orthopedics if a therapist has not contacted you within 48 hours of your return home. Call [facility] orthopedics for followup appointment in 2 weeks.   DISCHARGE MEDICATIONS: Advair Diskus 250 mcg,  50 mcg 1 puff inhaled 2 times a day, oxycodone 5 mg 1 to 2 tabs orally every 8 hours as needed for pain, Ventolin HFA CFC free 90 mcg inhalation aerosol 2 puffs inhaled 4 times a day, aspirin 81 mg oral tablet 1 tablet orally once a day at bedtime, Citalopram 20 mg oral tablet 1.5 tabs  orally once a day at bedtime, hydrocortisone cream 2.5% rectal cream with applicator one application rectally 2 times a day as needed, multivitamin 1 tablet orally once day at lunch time, Nexium 40 mg oral delayed-release capsule one cap orally once a day in the morning, ranitidine 150 mg oral tablet 1 tablet orally once day before supper, vitamin E 400 international units oral capsule one cap orally once a day at lunch time, fluticasone nasal 50 mcg inhalation nasal spray 2 sprays each nasal once a day in the morning, Tylenol 500 mg oral tablet 2 tabs orally 2 times a day as needed, cyclobenzaprine 10 mg oral tablet 1 tablet orally once day at bedtime, metoprolol succinate ER 25 mg oral tablet extended release 1 tablet orally once a day at lunch time, cetirizine 10 mg oral tablet 1 tab orally once a day in the morning as needed, magnesium hydroxide 8% oral suspension 30 mL orally 2 times a day as needed for constipation, Mylanta 30 mL orally every 6 hours as needed for indigestion or heartburn, Xarelto 10 mg oral tablet 1 tab orally once a day in the morning x 9 days, Benadryl 50 mg one cap orally every 4 to 6 hours needed of itching or allergic reaction, Bisacodyl 10 mg rectal suppository one suppository rectally once a day as needed for constipation.  ____________________________ T. Rachelle Hora, PA-C tcg:aw D: 07/13/2013 07:10:26 ET T: 07/13/2013 07:50:31 ET JOB#: [id number]  cc: T. Rachelle Hora, PA-C, <Dictator> Duanne Guess [place] ELECTRONICALLY SIGNED 07/20/2013 17:21

## 2014-12-09 ENCOUNTER — Telehealth: Payer: Self-pay | Admitting: *Deleted

## 2014-12-09 ENCOUNTER — Other Ambulatory Visit: Payer: Self-pay | Admitting: Internal Medicine

## 2014-12-09 ENCOUNTER — Other Ambulatory Visit (INDEPENDENT_AMBULATORY_CARE_PROVIDER_SITE_OTHER): Payer: BLUE CROSS/BLUE SHIELD

## 2014-12-09 DIAGNOSIS — R739 Hyperglycemia, unspecified: Secondary | ICD-10-CM | POA: Diagnosis not present

## 2014-12-09 DIAGNOSIS — E7800 Pure hypercholesterolemia, unspecified: Secondary | ICD-10-CM

## 2014-12-09 DIAGNOSIS — D649 Anemia, unspecified: Secondary | ICD-10-CM | POA: Diagnosis not present

## 2014-12-09 DIAGNOSIS — I10 Essential (primary) hypertension: Secondary | ICD-10-CM | POA: Diagnosis not present

## 2014-12-09 DIAGNOSIS — K219 Gastro-esophageal reflux disease without esophagitis: Secondary | ICD-10-CM

## 2014-12-09 LAB — BASIC METABOLIC PANEL
BUN: 14 mg/dL (ref 6–23)
CO2: 29 mEq/L (ref 19–32)
CREATININE: 0.87 mg/dL (ref 0.40–1.20)
Calcium: 9.3 mg/dL (ref 8.4–10.5)
Chloride: 106 mEq/L (ref 96–112)
GFR: 69.53 mL/min (ref >60.00)
Glucose, Bld: 100 mg/dL — ABNORMAL HIGH (ref 70–99)
POTASSIUM: 4.7 meq/L (ref 3.5–5.1)
SODIUM: 139 meq/L (ref 135–145)

## 2014-12-09 LAB — CBC WITH DIFFERENTIAL/PLATELET
Basophils Absolute: 0.1 10*3/uL (ref 0.0–0.1)
Basophils Relative: 1 % (ref 0.0–3.0)
EOS PCT: 3.5 % (ref 0.0–5.0)
Eosinophils Absolute: 0.3 10*3/uL (ref 0.0–0.7)
HEMATOCRIT: 36.5 % (ref 36.0–46.0)
Hemoglobin: 11.9 g/dL — ABNORMAL LOW (ref 12.0–15.0)
LYMPHS PCT: 34.3 % (ref 12.0–46.0)
Lymphs Abs: 3.2 10*3/uL (ref 0.7–4.0)
MCHC: 32.5 g/dL (ref 30.0–36.0)
MCV: 84.2 fl (ref 78.0–100.0)
MONOS PCT: 5.7 % (ref 3.0–12.0)
Monocytes Absolute: 0.5 10*3/uL (ref 0.1–1.0)
Neutro Abs: 5.2 10*3/uL (ref 1.4–7.7)
Neutrophils Relative %: 55.5 % (ref 43.0–77.0)
Platelets: 450 10*3/uL — ABNORMAL HIGH (ref 150.0–400.0)
RBC: 4.34 Mil/uL (ref 3.87–5.11)
RDW: 14.9 % (ref 11.5–15.5)
WBC: 9.3 10*3/uL (ref 4.0–10.5)

## 2014-12-09 LAB — LIPID PANEL
Cholesterol: 197 mg/dL (ref 0–200)
HDL: 49 mg/dL (ref >39.00)
LDL CALC: 114 mg/dL — AB (ref 0–99)
NONHDL: 148
Total CHOL/HDL Ratio: 4
Triglycerides: 170 mg/dL — ABNORMAL HIGH (ref 0.0–149.0)
VLDL: 34 mg/dL (ref 0.0–40.0)

## 2014-12-09 LAB — HEPATIC FUNCTION PANEL
ALK PHOS: 102 U/L (ref 39–117)
ALT: 20 U/L (ref 0–35)
AST: 20 U/L (ref 0–37)
Albumin: 4 g/dL (ref 3.5–5.2)
Bilirubin, Direct: 0.1 mg/dL (ref 0.0–0.3)
TOTAL PROTEIN: 7.2 g/dL (ref 6.0–8.3)
Total Bilirubin: 0.5 mg/dL (ref 0.2–1.2)

## 2014-12-09 LAB — HEMOGLOBIN A1C: Hgb A1c MFr Bld: 6 % (ref 4.6–6.5)

## 2014-12-09 LAB — FERRITIN: Ferritin: 23.8 ng/mL (ref 10.0–291.0)

## 2014-12-09 LAB — TSH: TSH: 1.69 u[IU]/mL (ref 0.35–4.50)

## 2014-12-09 NOTE — Telephone Encounter (Signed)
Order placed for labs.

## 2014-12-09 NOTE — Telephone Encounter (Signed)
labs complete

## 2014-12-09 NOTE — Progress Notes (Signed)
Orders placed for labs

## 2014-12-09 NOTE — Telephone Encounter (Signed)
Labs and dx?  

## 2014-12-10 ENCOUNTER — Other Ambulatory Visit (HOSPITAL_COMMUNITY)
Admission: RE | Admit: 2014-12-10 | Discharge: 2014-12-10 | Disposition: A | Discharge status: Home / Self Care | Payer: BLUE CROSS/BLUE SHIELD | Source: Ambulatory Visit | Attending: Internal Medicine | Admitting: Internal Medicine

## 2014-12-10 ENCOUNTER — Encounter: Payer: Self-pay | Admitting: Internal Medicine

## 2014-12-10 ENCOUNTER — Ambulatory Visit (INDEPENDENT_AMBULATORY_CARE_PROVIDER_SITE_OTHER): Payer: BLUE CROSS/BLUE SHIELD | Admitting: Internal Medicine

## 2014-12-10 VITALS — BP 137/78 | HR 87 | Temp 97.7°F | Ht 65.0 in | Wt 251.2 lb

## 2014-12-10 DIAGNOSIS — R079 Chest pain, unspecified: Secondary | ICD-10-CM

## 2014-12-10 DIAGNOSIS — F439 Reaction to severe stress, unspecified: Secondary | ICD-10-CM

## 2014-12-10 DIAGNOSIS — E7800 Pure hypercholesterolemia, unspecified: Secondary | ICD-10-CM

## 2014-12-10 DIAGNOSIS — E669 Obesity, unspecified: Secondary | ICD-10-CM

## 2014-12-10 DIAGNOSIS — R739 Hyperglycemia, unspecified: Secondary | ICD-10-CM

## 2014-12-10 DIAGNOSIS — Z1151 Encounter for screening for human papillomavirus (HPV): Secondary | ICD-10-CM | POA: Diagnosis present

## 2014-12-10 DIAGNOSIS — K219 Gastro-esophageal reflux disease without esophagitis: Secondary | ICD-10-CM | POA: Diagnosis not present

## 2014-12-10 DIAGNOSIS — Z658 Other specified problems related to psychosocial circumstances: Secondary | ICD-10-CM

## 2014-12-10 DIAGNOSIS — D75839 Thrombocytosis, unspecified: Secondary | ICD-10-CM

## 2014-12-10 DIAGNOSIS — Z01419 Encounter for gynecological examination (general) (routine) without abnormal findings: Secondary | ICD-10-CM | POA: Diagnosis present

## 2014-12-10 DIAGNOSIS — N760 Acute vaginitis: Secondary | ICD-10-CM | POA: Diagnosis not present

## 2014-12-10 DIAGNOSIS — D473 Essential (hemorrhagic) thrombocythemia: Secondary | ICD-10-CM

## 2014-12-10 DIAGNOSIS — I10 Essential (primary) hypertension: Secondary | ICD-10-CM | POA: Diagnosis not present

## 2014-12-10 DIAGNOSIS — D649 Anemia, unspecified: Secondary | ICD-10-CM

## 2014-12-10 DIAGNOSIS — Z0000 Encounter for general adult medical examination without abnormal findings: Secondary | ICD-10-CM

## 2014-12-10 NOTE — Progress Notes (Signed)
Pre visit review using our clinic review tool, if applicable. No additional management support is needed unless otherwise documented below in the visit note. 

## 2014-12-10 NOTE — Progress Notes (Signed)
Patient ID: Nicole Sims, female   DOB: 26-Dec-1950, 64 y.o.   MRN: [id number]   Subjective:    Patient ID: Nicole Sims, female    DOB: [birth year], 64 y.o.   MRN: [id number]  HPI  Patient here for a physical.  Reports some congestion.  Nasal congestion and "sniffles".  Some post nasal drainage.  No sob.  No increased cough.  Does report some pain that extends from her chest to her neck and also notices pain between her shoulders.  Intermittent.  Question of acid reflux.  No vomiting.  Eating and drinking.  Bowels appear to be stable.  Some vaginal irritation.    Past Medical History  Diagnosis Date  . Hypertension   . Hypercholesterolemia   . Palpitations   . Depression   . GERD (gastroesophageal reflux disease)   . Environmental allergies   . Urinary incontinence     mixed  . Osteoarthritis   . Diverticulosis   . Anemia   . Thrombocytosis   . Chronic headaches   . Hyperglycemia   . Asthma     Review of Systems  Constitutional: Negative for appetite change and unexpected weight change.  HENT: Positive for congestion and postnasal drip. Negative for sinus pressure.   Eyes: Negative for pain and visual disturbance.  Respiratory: Negative for cough (no increased cough), chest tightness and shortness of breath.   Cardiovascular: Positive for chest pain (some intermittent pain that extends up into her neck and some discomfort between her shoulders). Negative for palpitations and leg swelling.  Gastrointestinal: Negative for nausea, vomiting, abdominal pain and diarrhea.  Genitourinary: Negative for dysuria and difficulty urinating.  Musculoskeletal: Negative for joint swelling.       Pain between shoulders.   Skin: Negative for color change and rash.  Neurological: Negative for dizziness and light-headedness.  Hematological: Negative for adenopathy. Does not bruise/bleed easily.       Objective:     Blood pressure recheck:  130/78  Physical Exam  Constitutional: She is  oriented to person, place, and time. She appears well-developed and well-nourished.  HENT:  Nose: Nose normal.  Mouth/Throat: Oropharynx is clear and moist.  Eyes: Right eye exhibits no discharge. Left eye exhibits no discharge. No scleral icterus.  Neck: Neck supple. No thyromegaly present.  Cardiovascular: Normal rate and regular rhythm.   Pulmonary/Chest: Breath sounds normal. No accessory muscle usage. No tachypnea. No respiratory distress. She has no decreased breath sounds. She has no wheezes. She has no rhonchi. Right breast exhibits no inverted nipple, no mass, no nipple discharge and no tenderness (no axillary adenopathy). Left breast exhibits no inverted nipple, no mass, no nipple discharge and no tenderness (no axilarry adenopathy).  Abdominal: Soft. Bowel sounds are normal. There is no tenderness.  Genitourinary:  Normal external genitalia.  Vaginal vault without lesions.  Cervix identified.  Pap smear performed.  Could not appreciate any adnexal masses or tenderness.    Musculoskeletal: She exhibits no edema or tenderness.  Lymphadenopathy:    She has no cervical adenopathy.  Neurological: She is alert and oriented to person, place, and time.  Skin: Skin is warm. No rash noted.  Psychiatric: She has a normal mood and affect. Her behavior is normal.    BP 137/78 mmHg  Pulse 87  Temp(Src) 97.7 F (36.5 C) (Oral)  Ht $R'5\' 5"'iJ$  (1.651 m)  Wt 251 lb 4 oz (113.966 kg)  BMI 41.81 kg/m2  SpO2 98% Wt Readings from Last 3 Encounters:  12/10/14  251 lb 4 oz (113.966 kg)  08/05/14 250 lb (113.399 kg)  05/21/14 243 lb 12 oz (110.564 kg)     Lab Results  Component Value Date   WBC 9.3 12/09/2014   HGB 11.9* 12/09/2014   HCT 36.5 12/09/2014   PLT 450.0* 12/09/2014   GLUCOSE 100* 12/09/2014   CHOL 197 12/09/2014   TRIG 170.0* 12/09/2014   HDL 49.00 12/09/2014   LDLDIRECT 172.8 09/17/2013   LDLCALC 114* 12/09/2014   ALT 20 12/09/2014   AST 20 12/09/2014   NA 139 12/09/2014   K  4.7 12/09/2014   CL 106 12/09/2014   CREATININE 0.87 12/09/2014   BUN 14 12/09/2014   CO2 29 12/09/2014   TSH 1.69 12/09/2014   INR 0.9 07/02/2013   HGBA1C 6.0 12/09/2014       Assessment & Plan:   Problem List Items Addressed This Visit    Anemia    Saw Dr Ma Hillock.  Follow cbc.       Relevant Orders   Ferritin   Chest pain - Primary    Chest pain as outlined.  Unclear etiology.  May be GI origin.  Continue nexium.  EKG revealed SR with no acute ischemic changes.  Given risk factors and given persistent symptoms, will refer to cardiology for further evaluation.        Relevant Orders   EKG 12-Lead (Completed)   Cytology - PAP (Completed)   WET PREP BY MOLECULAR PROBE (Completed)   Ambulatory referral to Cardiology   GERD (gastroesophageal reflux disease)    Question if some of the symptoms are related to reflux.  On nexium.  Refer to GI for further evaluation of upper symptoms.        Relevant Medications   Probiotic Product (PROBIOTIC DAILY PO)   Other Relevant Orders   Ambulatory referral to Gastroenterology   Health care maintenance    Physical 12/10/14.  PAP 12/10/14.  Mammogram 01/29/14 - Birads I.  Colonoscopy 01/27/09.        Hypercholesterolemia    Low cholesterol diet and exercise.  On crestor.  Follow lipid panel and liver function tests.        Relevant Orders   Lipid panel   Hepatic function panel   Hyperglycemia    Low carb diet and exercise.  Follow met b and a1c.       Relevant Orders   TSH   Hemoglobin A1c   Hypertension    Blood pressure doing well.  Same medication regimen.  Follow pressures.  Follow metabolic panel.        Relevant Orders   Basic metabolic panel   Obesity    Diet and exercise.  Follow.       Stress    Feels she is handling stress relatively well.  Follow.  Same medication regimen.        Thrombocytosis    Spleen ok.  Was evaluated by Dr Ma Hillock.  Stable.  Follow.        Relevant Orders   CBC with  Differential/Platelet   Vaginitis    Sent wet prep/KOH.          I spent 25 minutes with the patient and more than 50% of the time was spent in consultation regarding the above.     Einar Pheasant, MD

## 2014-12-11 ENCOUNTER — Encounter: Payer: Self-pay | Admitting: Internal Medicine

## 2014-12-11 LAB — WET PREP BY MOLECULAR PROBE
Candida species: NEGATIVE
Gardnerella vaginalis: NEGATIVE
Trichomonas vaginosis: NEGATIVE

## 2014-12-12 ENCOUNTER — Encounter: Payer: Self-pay | Admitting: Internal Medicine

## 2014-12-12 LAB — CYTOLOGY - PAP

## 2014-12-13 NOTE — Telephone Encounter (Signed)
Unread mychart message mailed to patient 

## 2014-12-17 ENCOUNTER — Encounter: Payer: Self-pay | Admitting: Internal Medicine

## 2014-12-17 ENCOUNTER — Telehealth: Payer: Self-pay | Admitting: Internal Medicine

## 2014-12-17 DIAGNOSIS — Z0000 Encounter for general adult medical examination without abnormal findings: Secondary | ICD-10-CM | POA: Insufficient documentation

## 2014-12-17 DIAGNOSIS — L900 Lichen sclerosus et atrophicus: Secondary | ICD-10-CM | POA: Insufficient documentation

## 2014-12-17 DIAGNOSIS — R079 Chest pain, unspecified: Secondary | ICD-10-CM | POA: Insufficient documentation

## 2014-12-17 NOTE — Telephone Encounter (Signed)
Needs f/u appt in 4 months (72min).  Needs fasting labs 1-2 days before f/u appt.  Thanks.

## 2014-12-17 NOTE — Telephone Encounter (Signed)
Unread mychart message mailed to patient 

## 2014-12-17 NOTE — Assessment & Plan Note (Signed)
Chest pain as outlined.  Unclear etiology.  May be GI origin.  Continue nexium.  EKG revealed SR with no acute ischemic changes.  Given risk factors and given persistent symptoms, will refer to cardiology for further evaluation.

## 2014-12-17 NOTE — Assessment & Plan Note (Signed)
Saw Dr Ma Hillock.  Follow cbc.

## 2014-12-17 NOTE — Assessment & Plan Note (Signed)
Diet and exercise.  Follow.  

## 2014-12-17 NOTE — Assessment & Plan Note (Signed)
Question if some of the symptoms are related to reflux.  On nexium.  Refer to GI for further evaluation of upper symptoms.

## 2014-12-17 NOTE — Assessment & Plan Note (Signed)
Sent wet prep/KOH.

## 2014-12-17 NOTE — Assessment & Plan Note (Signed)
Blood pressure doing well.  Same medication regimen.  Follow pressures.  Follow metabolic panel.   

## 2014-12-17 NOTE — Assessment & Plan Note (Signed)
Low cholesterol diet and exercise.  On crestor.  Follow lipid panel and liver function tests.  

## 2014-12-17 NOTE — Assessment & Plan Note (Signed)
Spleen ok.  Was evaluated by Dr Ma Hillock.  Stable.  Follow.

## 2014-12-17 NOTE — Assessment & Plan Note (Signed)
Physical 12/10/14.  PAP 12/10/14.  Mammogram 01/29/14 - Birads I.  Colonoscopy 01/27/09.

## 2014-12-17 NOTE — Assessment & Plan Note (Signed)
Feels she is handling stress relatively well.  Follow.  Same medication regimen.

## 2014-12-17 NOTE — Assessment & Plan Note (Signed)
Low carb diet and exercise.  Follow met b and a1c.  

## 2014-12-18 ENCOUNTER — Other Ambulatory Visit: Payer: Self-pay | Admitting: Internal Medicine

## 2014-12-25 ENCOUNTER — Other Ambulatory Visit: Payer: Self-pay | Admitting: Internal Medicine

## 2014-12-25 DIAGNOSIS — L989 Disorder of the skin and subcutaneous tissue, unspecified: Secondary | ICD-10-CM

## 2014-12-25 NOTE — Progress Notes (Signed)
Order placed for dermatology referral.  

## 2015-01-01 ENCOUNTER — Other Ambulatory Visit: Payer: Self-pay | Admitting: Internal Medicine

## 2015-01-06 ENCOUNTER — Telehealth: Payer: Self-pay | Admitting: *Deleted

## 2015-01-06 NOTE — Telephone Encounter (Signed)
Need to confirm with pharmacy when pt last refilled effexor.

## 2015-01-06 NOTE — Telephone Encounter (Signed)
Pt called states she has misplaced her Effexor Rx.  She further states the last time she took the medication was on yesterday 5.22.16.  Pt also states she found a bottle dated 3.30.16 that had a few tablets in it.  She is requesting a new Rx, pt is aware she has a refill left at the pharmacy.  Please advise early refill

## 2015-01-06 NOTE — Telephone Encounter (Signed)
Spoke with pharmacist, the last fill date was 5.5.16 and she has one refill left.

## 2015-01-07 NOTE — Telephone Encounter (Signed)
Called pharmacy to authorize early refill.  Pharmacist states she spoke with pt and insurance yesterday and the insurance will not refill until 5.27.16.  Pharmacist also states she made pt aware of same.  Pts Effexor will not be refilled until 5.27.16.

## 2015-01-07 NOTE — Telephone Encounter (Signed)
Since out and has misplaced bottle, ok to refill early.  She needs to make sure taking correctly.  Will follow.

## 2015-01-21 DIAGNOSIS — G4733 Obstructive sleep apnea (adult) (pediatric): Secondary | ICD-10-CM | POA: Insufficient documentation

## 2015-01-21 DIAGNOSIS — I10 Essential (primary) hypertension: Secondary | ICD-10-CM | POA: Insufficient documentation

## 2015-01-29 ENCOUNTER — Other Ambulatory Visit: Payer: Self-pay | Admitting: Internal Medicine

## 2015-03-15 ENCOUNTER — Other Ambulatory Visit: Payer: Self-pay | Admitting: Internal Medicine

## 2015-04-18 ENCOUNTER — Other Ambulatory Visit: Payer: BLUE CROSS/BLUE SHIELD

## 2015-04-22 ENCOUNTER — Ambulatory Visit: Payer: BLUE CROSS/BLUE SHIELD | Admitting: Internal Medicine

## 2015-04-28 ENCOUNTER — Encounter: Payer: Self-pay | Admitting: Internal Medicine

## 2015-04-28 ENCOUNTER — Other Ambulatory Visit (INDEPENDENT_AMBULATORY_CARE_PROVIDER_SITE_OTHER): Payer: BLUE CROSS/BLUE SHIELD

## 2015-04-28 DIAGNOSIS — R739 Hyperglycemia, unspecified: Secondary | ICD-10-CM | POA: Diagnosis not present

## 2015-04-28 DIAGNOSIS — I10 Essential (primary) hypertension: Secondary | ICD-10-CM | POA: Diagnosis not present

## 2015-04-28 DIAGNOSIS — D649 Anemia, unspecified: Secondary | ICD-10-CM

## 2015-04-28 DIAGNOSIS — D75839 Thrombocytosis, unspecified: Secondary | ICD-10-CM

## 2015-04-28 DIAGNOSIS — D473 Essential (hemorrhagic) thrombocythemia: Secondary | ICD-10-CM

## 2015-04-28 DIAGNOSIS — E7800 Pure hypercholesterolemia, unspecified: Secondary | ICD-10-CM

## 2015-04-28 LAB — CBC WITH DIFFERENTIAL/PLATELET
BASOS ABS: 0.1 10*3/uL (ref 0.0–0.1)
Basophils Relative: 1.1 % (ref 0.0–3.0)
EOS ABS: 0.3 10*3/uL (ref 0.0–0.7)
Eosinophils Relative: 2.7 % (ref 0.0–5.0)
HEMATOCRIT: 36.7 % (ref 36.0–46.0)
HEMOGLOBIN: 12 g/dL (ref 12.0–15.0)
LYMPHS PCT: 33.3 % (ref 12.0–46.0)
Lymphs Abs: 3.2 10*3/uL (ref 0.7–4.0)
MCHC: 32.7 g/dL (ref 30.0–36.0)
MCV: 84.8 fl (ref 78.0–100.0)
Monocytes Absolute: 0.7 10*3/uL (ref 0.1–1.0)
Monocytes Relative: 7.3 % (ref 3.0–12.0)
Neutro Abs: 5.3 10*3/uL (ref 1.4–7.7)
Neutrophils Relative %: 55.6 % (ref 43.0–77.0)
PLATELETS: 441 10*3/uL — AB (ref 150.0–400.0)
RBC: 4.32 Mil/uL (ref 3.87–5.11)
RDW: 14.8 % (ref 11.5–15.5)
WBC: 9.6 10*3/uL (ref 4.0–10.5)

## 2015-04-28 LAB — BASIC METABOLIC PANEL
BUN: 13 mg/dL (ref 6–23)
CHLORIDE: 106 meq/L (ref 96–112)
CO2: 27 meq/L (ref 19–32)
CREATININE: 0.93 mg/dL (ref 0.40–1.20)
Calcium: 9.2 mg/dL (ref 8.4–10.5)
GFR: 64.3 mL/min (ref >60.00)
Glucose, Bld: 106 mg/dL — ABNORMAL HIGH (ref 70–99)
Potassium: 4.3 mEq/L (ref 3.5–5.1)
Sodium: 141 mEq/L (ref 135–145)

## 2015-04-28 LAB — HEPATIC FUNCTION PANEL
ALT: 43 U/L — AB (ref 0–35)
AST: 33 U/L (ref 0–37)
Albumin: 3.9 g/dL (ref 3.5–5.2)
Alkaline Phosphatase: 96 U/L (ref 39–117)
BILIRUBIN DIRECT: 0.1 mg/dL (ref 0.0–0.3)
TOTAL PROTEIN: 7.7 g/dL (ref 6.0–8.3)
Total Bilirubin: 0.5 mg/dL (ref 0.2–1.2)

## 2015-04-28 LAB — LIPID PANEL
CHOLESTEROL: 184 mg/dL (ref 0–200)
HDL: 52.9 mg/dL (ref >39.00)
LDL Cholesterol: 108 mg/dL — ABNORMAL HIGH (ref 0–99)
NonHDL: 130.79
Total CHOL/HDL Ratio: 3
Triglycerides: 114 mg/dL (ref 0.0–149.0)
VLDL: 22.8 mg/dL (ref 0.0–40.0)

## 2015-04-28 LAB — MICROALBUMIN / CREATININE URINE RATIO
CREATININE, U: 204.9 mg/dL
MICROALB UR: 1.1 mg/dL (ref 0.0–1.9)
MICROALB/CREAT RATIO: 0.5 mg/g (ref 0.0–30.0)

## 2015-04-28 LAB — TSH: TSH: 1.88 u[IU]/mL (ref 0.35–4.50)

## 2015-04-28 LAB — FERRITIN: Ferritin: 25.7 ng/mL (ref 10.0–291.0)

## 2015-04-28 LAB — HEMOGLOBIN A1C: HEMOGLOBIN A1C: 6 % (ref 4.6–6.5)

## 2015-04-30 ENCOUNTER — Other Ambulatory Visit: Payer: Self-pay | Admitting: Internal Medicine

## 2015-05-12 ENCOUNTER — Ambulatory Visit (INDEPENDENT_AMBULATORY_CARE_PROVIDER_SITE_OTHER): Payer: BLUE CROSS/BLUE SHIELD | Admitting: Internal Medicine

## 2015-05-12 ENCOUNTER — Encounter: Payer: Self-pay | Admitting: Internal Medicine

## 2015-05-12 VITALS — BP 120/78 | HR 86 | Temp 97.9°F | Resp 18 | Ht 65.0 in | Wt 253.4 lb

## 2015-05-12 DIAGNOSIS — E669 Obesity, unspecified: Secondary | ICD-10-CM

## 2015-05-12 DIAGNOSIS — Z658 Other specified problems related to psychosocial circumstances: Secondary | ICD-10-CM

## 2015-05-12 DIAGNOSIS — Z1239 Encounter for other screening for malignant neoplasm of breast: Secondary | ICD-10-CM

## 2015-05-12 DIAGNOSIS — R7989 Other specified abnormal findings of blood chemistry: Secondary | ICD-10-CM | POA: Diagnosis not present

## 2015-05-12 DIAGNOSIS — R519 Headache, unspecified: Secondary | ICD-10-CM

## 2015-05-12 DIAGNOSIS — E7800 Pure hypercholesterolemia, unspecified: Secondary | ICD-10-CM

## 2015-05-12 DIAGNOSIS — R51 Headache: Secondary | ICD-10-CM | POA: Diagnosis not present

## 2015-05-12 DIAGNOSIS — D473 Essential (hemorrhagic) thrombocythemia: Secondary | ICD-10-CM

## 2015-05-12 DIAGNOSIS — D649 Anemia, unspecified: Secondary | ICD-10-CM

## 2015-05-12 DIAGNOSIS — R739 Hyperglycemia, unspecified: Secondary | ICD-10-CM

## 2015-05-12 DIAGNOSIS — I10 Essential (primary) hypertension: Secondary | ICD-10-CM | POA: Diagnosis not present

## 2015-05-12 DIAGNOSIS — R945 Abnormal results of liver function studies: Secondary | ICD-10-CM

## 2015-05-12 DIAGNOSIS — G8929 Other chronic pain: Secondary | ICD-10-CM

## 2015-05-12 DIAGNOSIS — D75839 Thrombocytosis, unspecified: Secondary | ICD-10-CM

## 2015-05-12 DIAGNOSIS — F439 Reaction to severe stress, unspecified: Secondary | ICD-10-CM

## 2015-05-12 DIAGNOSIS — K219 Gastro-esophageal reflux disease without esophagitis: Secondary | ICD-10-CM

## 2015-05-12 NOTE — Progress Notes (Signed)
Patient ID: Nicole Sims, female   DOB: [birth year], 64 y.o.   MRN: [id number]   Subjective:    Patient ID: Nicole Sims, female    DOB: [birth year], 64 y.o.   MRN: [id number]  HPI  Patient with past history of hypertension, GERD, hyperglycemia and hypercholesterolemia who comes in today to follow up on these issues.  She reports noticing some issues with her memory.  Does not seem to remember things as well as she used to.  Some increased stress.  Discussed concentration.  She is also having problems with headaches.  Persistent.  Some increased pressure.  Some intermittent sinus congestion and pressure.  Ear feels full at times.  No chest congestion.  No cough.  No sob.  No chest pain or tightness.  No abdominal pain or cramping.     Past Medical History  Diagnosis Date  . Hypertension   . Hypercholesterolemia   . Palpitations   . Depression   . GERD (gastroesophageal reflux disease)   . Environmental allergies   . Urinary incontinence     mixed  . Osteoarthritis   . Diverticulosis   . Anemia   . Thrombocytosis   . Chronic headaches   . Hyperglycemia   . Asthma    Past Surgical History  Procedure Laterality Date  . Tonsillectomy  1962  . Cervical cone biopsy      [facility]  . Vaginal hysterectomy  1974    abnormal pap and carcinoma in situ  . Shoulder surgery  11/17/05  . Cholecystectomy    . Knee arthroscopy  08/13/08  . Rotator cuff repair      bilateral  . Knee replacement and revision      left   Family History  Problem Relation Age of Onset  . Diabetes Mellitus II Father   . Thyroid disease Father   . Breast cancer Maternal Aunt   . Colon cancer Neg Hx    Social History   Social History  . Marital Status: Married    Spouse Name: N/A  . Number of Children: N/A  . Years of Education: N/A   Social History Main Topics  . Smoking status: Former Smoker    Quit date: 08/17/1983  . Smokeless tobacco: Never Used  . Alcohol Use: 0.0 oz/week    0 Standard drinks or  equivalent per week     Comment: rarely  . Drug Use: No  . Sexual Activity: Not Asked   Other Topics Concern  . None   Social History Narrative    Outpatient Encounter Prescriptions as of 05/12/2015  Medication Sig  . albuterol (PROVENTIL HFA;VENTOLIN HFA) 108 (90 BASE) MCG/ACT inhaler Inhale 2 puffs into the lungs every 4 (four) hours as needed.  . Biotin 1000 MCG tablet Take 1,000 mcg by mouth daily.  . calcium gluconate 500 MG tablet Take 500 mg by mouth 2 (two) times daily.  . cetirizine (ZYRTEC) 10 MG tablet Take 10 mg by mouth daily.  . CHROMIUM-CINNAMON PO Take 2 capsules by mouth 2 (two) times daily.  . cyclobenzaprine (FLEXERIL) 10 MG tablet Take 10 mg by mouth at bedtime as needed.   Marland Kitchen esomeprazole (NEXIUM) 40 MG capsule TAKE ONE CAPSULE BY MOUTH TWICE DAILY  . fluticasone (FLONASE) 50 MCG/ACT nasal spray USE 2 SPRAYS IN EACH NOSTRIL EVERY DAY  . Fluticasone-Salmeterol (ADVAIR DISKUS) 250-50 MCG/DOSE AEPB Inhale 1 puff into the lungs 2 (two) times daily.  Marland Kitchen gabapentin (NEURONTIN) 100 MG capsule Take 100 mg by  mouth 3 (three) times daily as needed.  . Multiple Vitamin (MULTIVITAMIN) tablet Take 1 tablet by mouth daily.  . naproxen sodium (ANAPROX) 220 MG tablet Take 220 mg by mouth 2 (two) times daily with a meal.  . oxyCODONE (OXY IR/ROXICODONE) 5 MG immediate release tablet Take 5 mg by mouth 2 (two) times daily as needed.  . Probiotic Product (PROBIOTIC DAILY PO) Take by mouth.  . venlafaxine XR (EFFEXOR-XR) 150 MG 24 hr capsule TAKE 1 CAPSULE BY MOUTH EVERY DAY WITH BREAKFAST  . [DISCONTINUED] CRESTOR 5 MG tablet TAKE 1 TABLET BY MOUTH DAILY  . [DISCONTINUED] metoprolol succinate (TOPROL-XL) 50 MG 24 hr tablet TAKE 1 TABLET BY MOUTH EVERY DAY WITH OR IMMEDIATELY FOLLOWING A MEAL.   No facility-administered encounter medications on file as of 05/12/2015.    Review of Systems  Constitutional: Negative for appetite change and unexpected weight change.  HENT: Positive for  congestion and sinus pressure.   Eyes: Negative for pain and visual disturbance.  Respiratory: Negative for cough, chest tightness and shortness of breath.   Cardiovascular: Negative for chest pain, palpitations and leg swelling.  Gastrointestinal: Negative for nausea, vomiting, abdominal pain and diarrhea.  Genitourinary: Negative for dysuria and difficulty urinating.  Musculoskeletal: Negative for joint swelling and gait problem.  Skin: Negative for color change and rash.  Neurological: Positive for headaches. Negative for dizziness and light-headedness.  Psychiatric/Behavioral: Negative for dysphoric mood and agitation.       Objective:    Physical Exam  Constitutional: She appears well-developed and well-nourished. No distress.  HENT:  Nose: Nose normal.  Mouth/Throat: Oropharynx is clear and moist.  Eyes: Conjunctivae are normal. Right eye exhibits no discharge. Left eye exhibits no discharge.  Neck: Neck supple. No thyromegaly present.  Cardiovascular: Normal rate and regular rhythm.   Pulmonary/Chest: Breath sounds normal. No respiratory distress. She has no wheezes.  Abdominal: Soft. Bowel sounds are normal. There is no tenderness.  Musculoskeletal: She exhibits no edema or tenderness.  Lymphadenopathy:    She has no cervical adenopathy.  Skin: No rash noted. No erythema.  Psychiatric: She has a normal mood and affect. Her behavior is normal.    BP 120/78 mmHg  Pulse 86  Temp(Src) 97.9 F (36.6 C) (Oral)  Resp 18  Ht 5\' 5"  (1.651 m)  Wt 253 lb 6 oz (114.93 kg)  BMI 42.16 kg/m2  SpO2 96% Wt Readings from Last 3 Encounters:  05/12/15 253 lb 6 oz (114.93 kg)  12/10/14 251 lb 4 oz (113.966 kg)  08/05/14 250 lb (113.399 kg)     Lab Results  Component Value Date   WBC 9.6 04/28/2015   HGB 12.0 04/28/2015   HCT 36.7 04/28/2015   PLT 441.0* 04/28/2015   GLUCOSE 106* 04/28/2015   CHOL 184 04/28/2015   TRIG 114.0 04/28/2015   HDL 52.90 04/28/2015   LDLDIRECT  172.8 09/17/2013   LDLCALC 108* 04/28/2015   ALT 43* 04/28/2015   AST 33 04/28/2015   NA 141 04/28/2015   K 4.3 04/28/2015   CL 106 04/28/2015   CREATININE 0.93 04/28/2015   BUN 13 04/28/2015   CO2 27 04/28/2015   TSH 1.88 04/28/2015   INR 0.9 07/02/2013   HGBA1C 6.0 04/28/2015   MICROALBUR 1.1 04/28/2015       Assessment & Plan:   Problem List Items Addressed This Visit    None    Visit Diagnoses    Screening breast examination    -  Primary  Relevant Orders    MM DIGITAL SCREENING BILATERAL        Einar Pheasant, MD

## 2015-05-12 NOTE — Progress Notes (Signed)
Pre-visit discussion using our clinic review tool. No additional management support is needed unless otherwise documented below in the visit note.  

## 2015-05-13 ENCOUNTER — Other Ambulatory Visit: Payer: Self-pay | Admitting: Internal Medicine

## 2015-05-13 LAB — HEPATIC FUNCTION PANEL
ALK PHOS: 109 U/L (ref 39–117)
ALT: 29 U/L (ref 0–35)
AST: 23 U/L (ref 0–37)
Albumin: 4.3 g/dL (ref 3.5–5.2)
BILIRUBIN DIRECT: 0.1 mg/dL (ref 0.0–0.3)
BILIRUBIN TOTAL: 0.4 mg/dL (ref 0.2–1.2)
TOTAL PROTEIN: 7.8 g/dL (ref 6.0–8.3)

## 2015-05-13 LAB — CREATININE, SERUM: CREATININE: 1.01 mg/dL (ref 0.40–1.20)

## 2015-05-13 LAB — SEDIMENTATION RATE: Sed Rate: 39 mm/hr — ABNORMAL HIGH (ref 0–22)

## 2015-05-14 ENCOUNTER — Encounter: Payer: Self-pay | Admitting: Internal Medicine

## 2015-05-15 ENCOUNTER — Other Ambulatory Visit: Payer: Self-pay | Admitting: Internal Medicine

## 2015-05-15 DIAGNOSIS — R51 Headache: Principal | ICD-10-CM

## 2015-05-15 DIAGNOSIS — R519 Headache, unspecified: Secondary | ICD-10-CM

## 2015-05-15 DIAGNOSIS — R42 Dizziness and giddiness: Secondary | ICD-10-CM

## 2015-05-15 MED ORDER — ONDANSETRON HCL 4 MG PO TABS: 4.0000 mg | ORAL_TABLET | Freq: Two times a day (BID) | ORAL | Status: DC | PRN

## 2015-05-15 NOTE — Progress Notes (Signed)
rx sent in for zofran #20 with no refills.  Order placed for mri brain.

## 2015-05-17 ENCOUNTER — Encounter: Payer: Self-pay | Admitting: Internal Medicine

## 2015-05-17 NOTE — Assessment & Plan Note (Signed)
Blood pressure under good control.  Continue same medication regimen.  Follow pressures.  Follow metabolic panel.   

## 2015-05-17 NOTE — Assessment & Plan Note (Signed)
Has seen Dr Ma Hillock.  04/28/15 - hgb - 12.0.

## 2015-05-17 NOTE — Assessment & Plan Note (Signed)
Diet and exercise.  Follow.  

## 2015-05-17 NOTE — Assessment & Plan Note (Signed)
On nexium.  Continue.   

## 2015-05-17 NOTE — Assessment & Plan Note (Signed)
Spleen ok.  Has been evaluated by Dr Ma Hillock.  Follow cbc.

## 2015-05-17 NOTE — Assessment & Plan Note (Signed)
Was on topamax.  Previously saw Dr Domingo Cocking.  Increased headaches recently.  Some concern regarding possible memory change.  Check ESR.  Check MRI.  Treat sinus.  nasacort and saline nasal spray as directed.

## 2015-05-17 NOTE — Assessment & Plan Note (Signed)
Low carb diet and exercise.  Follow met b and a1c.  

## 2015-05-17 NOTE — Assessment & Plan Note (Signed)
Low cholesterol diet and exercise.  On crestor.  Follow lipid panel and liver function tests.  

## 2015-05-17 NOTE — Assessment & Plan Note (Signed)
Increased stress.  Follow.  No further intervention at this time.

## 2015-05-19 ENCOUNTER — Ambulatory Visit
Admission: RE | Admit: 2015-05-19 | Discharge: 2015-05-19 | Disposition: A | Discharge status: Home / Self Care | Payer: BLUE CROSS/BLUE SHIELD | Source: Ambulatory Visit | Attending: Internal Medicine | Admitting: Internal Medicine

## 2015-05-19 DIAGNOSIS — Z1231 Encounter for screening mammogram for malignant neoplasm of breast: Secondary | ICD-10-CM | POA: Insufficient documentation

## 2015-05-19 DIAGNOSIS — Z1239 Encounter for other screening for malignant neoplasm of breast: Secondary | ICD-10-CM

## 2015-05-22 ENCOUNTER — Ambulatory Visit: Payer: BLUE CROSS/BLUE SHIELD

## 2015-05-23 ENCOUNTER — Other Ambulatory Visit: Payer: Self-pay | Admitting: Internal Medicine

## 2015-05-26 ENCOUNTER — Ambulatory Visit
Admission: RE | Admit: 2015-05-26 | Discharge: 2015-05-26 | Disposition: A | Discharge status: Home / Self Care | Payer: BLUE CROSS/BLUE SHIELD | Source: Ambulatory Visit | Attending: Internal Medicine | Admitting: Internal Medicine

## 2015-05-26 DIAGNOSIS — R519 Headache, unspecified: Secondary | ICD-10-CM

## 2015-05-26 DIAGNOSIS — R42 Dizziness and giddiness: Secondary | ICD-10-CM | POA: Diagnosis present

## 2015-05-26 DIAGNOSIS — R51 Headache: Secondary | ICD-10-CM | POA: Insufficient documentation

## 2015-05-26 MED ORDER — GADOBENATE DIMEGLUMINE 529 MG/ML IV SOLN
20.0000 mL | Freq: Once | INTRAVENOUS | Status: AC | PRN
  Administered 2015-05-26: 20 mL via INTRAVENOUS

## 2015-05-27 ENCOUNTER — Other Ambulatory Visit: Payer: Self-pay | Admitting: Internal Medicine

## 2015-05-27 DIAGNOSIS — R42 Dizziness and giddiness: Secondary | ICD-10-CM

## 2015-05-27 DIAGNOSIS — R519 Headache, unspecified: Secondary | ICD-10-CM

## 2015-05-27 DIAGNOSIS — R51 Headache: Principal | ICD-10-CM

## 2015-05-27 NOTE — Progress Notes (Signed)
Order placed for neurology referral.   

## 2015-07-21 ENCOUNTER — Ambulatory Visit (INDEPENDENT_AMBULATORY_CARE_PROVIDER_SITE_OTHER): Payer: BLUE CROSS/BLUE SHIELD | Admitting: Internal Medicine

## 2015-07-21 ENCOUNTER — Encounter: Payer: Self-pay | Admitting: Internal Medicine

## 2015-07-21 VITALS — BP 100/70 | HR 74 | Temp 97.7°F | Resp 18 | Ht 65.0 in | Wt 254.0 lb

## 2015-07-21 DIAGNOSIS — R002 Palpitations: Secondary | ICD-10-CM

## 2015-07-21 DIAGNOSIS — Z0000 Encounter for general adult medical examination without abnormal findings: Secondary | ICD-10-CM

## 2015-07-21 DIAGNOSIS — D473 Essential (hemorrhagic) thrombocythemia: Secondary | ICD-10-CM

## 2015-07-21 DIAGNOSIS — D75839 Thrombocytosis, unspecified: Secondary | ICD-10-CM

## 2015-07-21 DIAGNOSIS — M1990 Unspecified osteoarthritis, unspecified site: Secondary | ICD-10-CM

## 2015-07-21 DIAGNOSIS — N760 Acute vaginitis: Secondary | ICD-10-CM

## 2015-07-21 DIAGNOSIS — R739 Hyperglycemia, unspecified: Secondary | ICD-10-CM

## 2015-07-21 DIAGNOSIS — N898 Other specified noninflammatory disorders of vagina: Secondary | ICD-10-CM

## 2015-07-21 DIAGNOSIS — K219 Gastro-esophageal reflux disease without esophagitis: Secondary | ICD-10-CM

## 2015-07-21 DIAGNOSIS — I10 Essential (primary) hypertension: Secondary | ICD-10-CM | POA: Diagnosis not present

## 2015-07-21 DIAGNOSIS — Z658 Other specified problems related to psychosocial circumstances: Secondary | ICD-10-CM

## 2015-07-21 DIAGNOSIS — E7800 Pure hypercholesterolemia, unspecified: Secondary | ICD-10-CM

## 2015-07-21 DIAGNOSIS — F439 Reaction to severe stress, unspecified: Secondary | ICD-10-CM

## 2015-07-21 NOTE — Progress Notes (Signed)
Patient ID: Nicole Sims, female   DOB: 08/15/1951, 64 y.o.   MRN: [id number]   Subjective:    Patient ID: Nicole Sims, female    DOB: Dec 16, 1950, 64 y.o.   MRN: [id number]  HPI  Patient with past history of hypercholesterolemia, GERD, hypertension, hyperglycemia and depression.  She comes in today to follow up on these issues.  She recently saw Dr Nehemiah Massed.  See his note for details.  He stopped the crestor.  Wanted to confirm her current issues not being worsened by crestor.  She reports some memory issues.  Appears to have concentration issues.  Does not feel any different off crestor.  Discussed restarting.  Back on metoprolol.  This controls the palpitations.  Appears to be stable from cardiac standpoint.  Will have some intermittent flares with her breathing.  Uses a neb prn.  Helps.  Overall breathing appears to be stable.  No problems with acid reflux.   Increased stress.  Is contemplating stopping caring for children - retiring.  No abdominal pain or cramping.  Bowels stable.  Some vaginal irritation.   Uses cleaning wipes daily.  Has appt scheduled with neurology 08/07/15.     Past Medical History  Diagnosis Date  . Hypertension   . Hypercholesterolemia   . Palpitations   . Depression   . GERD (gastroesophageal reflux disease)   . Environmental allergies   . Urinary incontinence     mixed  . Osteoarthritis   . Diverticulosis   . Anemia   . Thrombocytosis ([place])   . Chronic headaches   . Hyperglycemia   . Asthma    Past Surgical History  Procedure Laterality Date  . Tonsillectomy  1962  . Cervical cone biopsy      [facility]  . Vaginal hysterectomy  1974    abnormal pap and carcinoma in situ  . Shoulder surgery  11/17/05  . Cholecystectomy    . Knee arthroscopy  08/13/08  . Rotator cuff repair      bilateral  . Knee replacement and revision      left  . Breast cyst aspiration Bilateral 2005    approximate year   Family History  Problem Relation Age of Onset  . Diabetes  Mellitus II Father   . Thyroid disease Father   . Breast cancer Maternal Aunt   . Colon cancer Neg Hx    Social History   Social History  . Marital Status: Married    Spouse Name: N/A  . Number of Children: N/A  . Years of Education: N/A   Social History Main Topics  . Smoking status: Former Smoker    Quit date: 08/17/1983  . Smokeless tobacco: Never Used  . Alcohol Use: 0.0 oz/week    0 Standard drinks or equivalent per week     Comment: rarely  . Drug Use: No  . Sexual Activity: Not Asked   Other Topics Concern  . None   Social History Narrative    Outpatient Encounter Prescriptions as of 07/21/2015  Medication Sig  . albuterol (PROVENTIL HFA;VENTOLIN HFA) 108 (90 BASE) MCG/ACT inhaler Inhale 2 puffs into the lungs every 4 (four) hours as needed.  . Biotin 1000 MCG tablet Take 1,000 mcg by mouth daily.  . calcium gluconate 500 MG tablet Take 500 mg by mouth 2 (two) times daily.  . cetirizine (ZYRTEC) 10 MG tablet Take 10 mg by mouth daily.  . cyclobenzaprine (FLEXERIL) 10 MG tablet Take 10 mg by mouth at bedtime as  needed.   Marland Kitchen esomeprazole (NEXIUM) 40 MG capsule TAKE ONE CAPSULE BY MOUTH TWICE DAILY  . fluticasone (FLONASE) 50 MCG/ACT nasal spray USE 2 SPRAYS IN EACH NOSTRIL EVERY DAY  . Fluticasone-Salmeterol (ADVAIR DISKUS) 250-50 MCG/DOSE AEPB Inhale 1 puff into the lungs 2 (two) times daily.  . metoprolol succinate (TOPROL-XL) 50 MG 24 hr tablet TAKE 1 TABLET BY MOUTH EVERY DAY WITH OR IMMEDIATELY FOLLOWING A MEAL.  . Multiple Vitamin (MULTIVITAMIN) tablet Take 1 tablet by mouth daily.  . naproxen sodium (ANAPROX) 220 MG tablet Take 220 mg by mouth 2 (two) times daily with a meal.  . ondansetron (ZOFRAN) 4 MG tablet Take 1 tablet (4 mg total) by mouth 2 (two) times daily as needed for nausea or vomiting.  Marland Kitchen oxyCODONE (OXY IR/ROXICODONE) 5 MG immediate release tablet Take 5 mg by mouth 2 (two) times daily as needed.  . Probiotic Product (PROBIOTIC DAILY PO) Take by  mouth.  . venlafaxine XR (EFFEXOR-XR) 150 MG 24 hr capsule TAKE 1 CAPSULE BY MOUTH EVERY DAY WITH BREAKFAST  . [DISCONTINUED] CHROMIUM-CINNAMON PO Take 2 capsules by mouth 2 (two) times daily.  . [DISCONTINUED] gabapentin (NEURONTIN) 100 MG capsule Take 100 mg by mouth 3 (three) times daily as needed.  . rosuvastatin (CRESTOR) 5 MG tablet TAKE 1 TABLET BY MOUTH EVERY DAY (Patient not taking: Reported on 07/21/2015)   No facility-administered encounter medications on file as of 07/21/2015.    Review of Systems  Constitutional: Negative for fever and unexpected weight change.  HENT: Negative for congestion and sinus pressure.   Eyes: Negative for pain and discharge.  Respiratory: Negative for cough, chest tightness and shortness of breath (intermittent flares with her breathin as outlined.  ).   Cardiovascular: Negative for chest pain, palpitations and leg swelling.  Gastrointestinal: Negative for nausea, vomiting, abdominal pain and diarrhea.  Genitourinary: Negative for dysuria and difficulty urinating.       Vaginal irritation as outlined.   Musculoskeletal: Negative for myalgias and joint swelling.  Skin: Negative for color change and rash.  Neurological: Negative for dizziness, light-headedness and headaches.  Psychiatric/Behavioral:       Increased stress as outlined.         Objective:    Physical Exam  Constitutional: She appears well-developed and well-nourished. No distress.  HENT:  Nose: Nose normal.  Mouth/Throat: Oropharynx is clear and moist.  Eyes: Conjunctivae are normal. Right eye exhibits no discharge. Left eye exhibits no discharge.  Neck: Neck supple. No thyromegaly present.  Cardiovascular: Normal rate and regular rhythm.   Pulmonary/Chest: Breath sounds normal. No respiratory distress. She has no wheezes.  Abdominal: Soft. Bowel sounds are normal. There is no tenderness.  Genitourinary:  GU exam reveals normal external genitalia.  Vaginal vault without  lesions.  Discharge present.  KOH/wet prep sent.  Could not appreciate any adnexal masses or tenderness.  Musculoskeletal: She exhibits no edema or tenderness.  Lymphadenopathy:    She has no cervical adenopathy.  Skin: No rash noted. No erythema.  Psychiatric: She has a normal mood and affect. Her behavior is normal.    BP 100/70 mmHg  Pulse 74  Temp(Src) 97.7 F (36.5 C) (Oral)  Resp 18  Ht $R'5\' 5"'km$  (1.651 m)  Wt 254 lb (115.214 kg)  BMI 42.27 kg/m2  SpO2 98% Wt Readings from Last 3 Encounters:  07/21/15 254 lb (115.214 kg)  05/26/15 253 lb (114.76 kg)  05/12/15 253 lb 6 oz (114.93 kg)     Lab Results  Component Value Date   WBC 9.6 04/28/2015   HGB 12.0 04/28/2015   HCT 36.7 04/28/2015   PLT 441.0* 04/28/2015   GLUCOSE 106* 04/28/2015   CHOL 184 04/28/2015   TRIG 114.0 04/28/2015   HDL 52.90 04/28/2015   LDLDIRECT 172.8 09/17/2013   LDLCALC 108* 04/28/2015   ALT 29 05/12/2015   AST 23 05/12/2015   NA 141 04/28/2015   K 4.3 04/28/2015   CL 106 04/28/2015   CREATININE 1.01 05/12/2015   BUN 13 04/28/2015   CO2 27 04/28/2015   TSH 1.88 04/28/2015   INR 0.9 07/02/2013   HGBA1C 6.0 04/28/2015   MICROALBUR 1.1 04/28/2015    Mr Brain W Wo Contrast  05/26/2015  CLINICAL DATA:  Headache and dizziness EXAM: MRI HEAD WITHOUT AND WITH CONTRAST TECHNIQUE: Multiplanar, multiecho pulse sequences of the brain and surrounding structures were obtained without and with intravenous contrast. CONTRAST:  35mL MULTIHANCE GADOBENATE DIMEGLUMINE 529 MG/ML IV SOLN COMPARISON:  MRI 09/14/2007 FINDINGS: Ventricle size is normal. Cerebral volume is normal. Pituitary normal in size. Negative for acute infarct. Scattered small hyperintensities in the cerebral white matter bilaterally similar to the prior study and consistent with chronic microvascular ischemia. Brainstem and cerebellum normal. Negative for intracranial hemorrhage or fluid collection Negative for mass or edema Postcontrast imaging  reveals normal enhancement. Paranasal sinuses show minimal mucosal edema. No air-fluid level. Normal orbit. Mastoid sinus has mild mucosal edema on the left. IMPRESSION: No acute intracranial abnormality. Mild chronic microvascular ischemic change in the white matter. Electronically Signed   By: Franchot Gallo M.D.   On: 05/26/2015 17:02       Assessment & Plan:   Problem List Items Addressed This Visit    GERD (gastroesophageal reflux disease)    On nexium.  Continue.  Upper symptoms appear to be controlled.        Hypercholesterolemia    Will restart crestor.  Low cholesterol diet and exercise.  Follow lipid panel and liver function tests.        Relevant Orders   Lipid panel   Hepatic function panel   Hyperglycemia    Discussed low carb diet and exercise.  Follow met b and a1c.        Relevant Orders   Hemoglobin A1c   Hypertension    Blood pressure under good control.  Continue same medication regimen.  Follow pressures.  Follow metabolic panel.        Relevant Orders   Basic metabolic panel   Osteoarthritis    Sees ortho.        Palpitations    Controlled on metoprolol.  Follow.        Severe obesity (BMI >= 40) (HCC)    Diet and exercise.  Follow.        Stress    Increased stress.  Planning to retire.  Does not feel needs any further intervention at this time.  Follow.        Thrombocytosis ([facility])    Spleen ok.  Has been evaluated by Dr Ma Hillock.  Follow cbc.       Relevant Orders   CBC with Differential/Platelet   Vaginitis    Pelvic exam performed.  Wet prep/KOH sent.         Other Visit Diagnoses    Vaginal discharge    -  Primary    Routine general medical examination at a health care facility        Relevant Orders    Hepatitis  C antibody        Einar Pheasant, MD

## 2015-07-21 NOTE — Progress Notes (Signed)
Pre-visit discussion using our clinic review tool. No additional management support is needed unless otherwise documented below in the visit note.  

## 2015-07-22 ENCOUNTER — Encounter: Payer: Self-pay | Admitting: Internal Medicine

## 2015-07-22 LAB — WET PREP BY MOLECULAR PROBE
Candida species: NEGATIVE
Gardnerella vaginalis: NEGATIVE
Trichomonas vaginosis: NEGATIVE

## 2015-07-23 ENCOUNTER — Encounter: Payer: Self-pay | Admitting: Internal Medicine

## 2015-07-27 ENCOUNTER — Encounter: Payer: Self-pay | Admitting: Internal Medicine

## 2015-07-27 NOTE — Assessment & Plan Note (Signed)
Discussed low carb diet and exercise.  Follow met b and a1c.   

## 2015-07-27 NOTE — Assessment & Plan Note (Signed)
Sees ortho

## 2015-07-27 NOTE — Assessment & Plan Note (Signed)
Will restart crestor.  Low cholesterol diet and exercise.  Follow lipid panel and liver function tests.

## 2015-07-27 NOTE — Assessment & Plan Note (Signed)
Blood pressure under good control.  Continue same medication regimen.  Follow pressures.  Follow metabolic panel.   

## 2015-07-27 NOTE — Assessment & Plan Note (Signed)
Diet and exercise.  Follow.  

## 2015-07-27 NOTE — Assessment & Plan Note (Signed)
On nexium.  Continue.  Upper symptoms appear to be controlled.

## 2015-07-27 NOTE — Assessment & Plan Note (Signed)
Increased stress.  Planning to retire.  Does not feel needs any further intervention at this time.  Follow.

## 2015-07-27 NOTE — Assessment & Plan Note (Signed)
Spleen ok.  Has been evaluated by Dr Pandit.  Follow cbc.  

## 2015-07-27 NOTE — Assessment & Plan Note (Signed)
Pelvic exam performed.  Wet prep/KOH sent.

## 2015-07-27 NOTE — Assessment & Plan Note (Signed)
Controlled on metoprolol.  Follow.  

## 2015-08-25 ENCOUNTER — Ambulatory Visit
Admission: RE | Admit: 2015-08-25 | Discharge: 2015-08-25 | Disposition: A | Discharge status: Home / Self Care | Payer: BLUE CROSS/BLUE SHIELD | Source: Ambulatory Visit | Attending: Internal Medicine | Admitting: Internal Medicine

## 2015-08-25 ENCOUNTER — Encounter: Payer: Self-pay | Admitting: Internal Medicine

## 2015-08-25 ENCOUNTER — Ambulatory Visit (INDEPENDENT_AMBULATORY_CARE_PROVIDER_SITE_OTHER): Payer: BLUE CROSS/BLUE SHIELD | Admitting: Internal Medicine

## 2015-08-25 ENCOUNTER — Other Ambulatory Visit: Payer: BLUE CROSS/BLUE SHIELD

## 2015-08-25 VITALS — BP 103/71 | HR 68 | Temp 98.5°F | Ht 65.0 in | Wt 257.0 lb

## 2015-08-25 DIAGNOSIS — K219 Gastro-esophageal reflux disease without esophagitis: Secondary | ICD-10-CM

## 2015-08-25 DIAGNOSIS — R053 Chronic cough: Secondary | ICD-10-CM | POA: Insufficient documentation

## 2015-08-25 DIAGNOSIS — R0602 Shortness of breath: Secondary | ICD-10-CM | POA: Diagnosis present

## 2015-08-25 DIAGNOSIS — J329 Chronic sinusitis, unspecified: Secondary | ICD-10-CM | POA: Diagnosis not present

## 2015-08-25 DIAGNOSIS — I10 Essential (primary) hypertension: Secondary | ICD-10-CM

## 2015-08-25 DIAGNOSIS — R05 Cough: Secondary | ICD-10-CM | POA: Diagnosis present

## 2015-08-25 DIAGNOSIS — R059 Cough, unspecified: Secondary | ICD-10-CM

## 2015-08-25 MED ORDER — LEVOFLOXACIN 500 MG PO TABS: 500.0000 mg | ORAL_TABLET | Freq: Every day | ORAL | Status: DC

## 2015-08-25 MED ORDER — PREDNISONE 10 MG PO TABS: ORAL_TABLET | ORAL | Status: DC

## 2015-08-25 NOTE — Patient Instructions (Addendum)
Saline nasal spray - flush nose at least 2-3x/day  nasacort nasal spray - 2 sprays each nostril one time per day.  Do this in the evening.    Mucinex in the am and robitussin in the evening.    Increase the advair to twice a day.

## 2015-08-25 NOTE — Progress Notes (Signed)
Patient ID: Nicole Sims, female   DOB: May 16, 1951, 65 y.o.   MRN: [id number]   Subjective:    Patient ID: Nicole Sims, female    DOB: October 03, 1950, 65 y.o.   MRN: [id number]  HPI  Patient with past history of hypercholesterolemia, GERD and hypertension.  She comes in today as a work in with concerns regarding increased cough and congestion.  She reports that symptoms started 08/10/15.  Began with runny nose, cough and congestion.  Increased nasal stuffiness.  Increased drainage.  Nasal mucus - green and blood tinged. Question of wheezing.  Reports increased cough and fits of coughing.  Some sob.  Has used her neg on a couple of occasions.  Using advair one time per day.  No vomiting.  No diarrhea.  No fever.  Some nausea and dizziness since this started.  Taking CF tussin.     Past Medical History  Diagnosis Date  . Hypertension   . Hypercholesterolemia   . Palpitations   . Depression   . GERD (gastroesophageal reflux disease)   . Environmental allergies   . Urinary incontinence     mixed  . Osteoarthritis   . Diverticulosis   . Anemia   . Thrombocytosis ([place])   . Chronic headaches   . Hyperglycemia   . Asthma    Past Surgical History  Procedure Laterality Date  . Tonsillectomy  1962  . Cervical cone biopsy      CIS  . Vaginal hysterectomy  1974    abnormal pap and carcinoma in situ  . Shoulder surgery  11/17/05  . Cholecystectomy    . Knee arthroscopy  08/13/08  . Rotator cuff repair      bilateral  . Knee replacement and revision      left  . Breast cyst aspiration Bilateral 2005    approximate year   Family History  Problem Relation Age of Onset  . Diabetes Mellitus II Father   . Thyroid disease Father   . Breast cancer Maternal Aunt   . Colon cancer Neg Hx    Social History   Social History  . Marital Status: Married    Spouse Name: N/A  . Number of Children: N/A  . Years of Education: N/A   Social History Main Topics  . Smoking status: Former Smoker   Quit date: 08/17/1983  . Smokeless tobacco: Never Used  . Alcohol Use: 0.0 oz/week    0 Standard drinks or equivalent per week     Comment: rarely  . Drug Use: No  . Sexual Activity: Not Asked   Other Topics Concern  . None   Social History Narrative    Outpatient Encounter Prescriptions as of 08/25/2015  Medication Sig  . albuterol (PROVENTIL HFA;VENTOLIN HFA) 108 (90 BASE) MCG/ACT inhaler Inhale 2 puffs into the lungs every 4 (four) hours as needed.  . Biotin 1000 MCG tablet Take 1,000 mcg by mouth daily.  . calcium gluconate 500 MG tablet Take 500 mg by mouth 2 (two) times daily.  . cetirizine (ZYRTEC) 10 MG tablet Take 10 mg by mouth daily.  . cyclobenzaprine (FLEXERIL) 10 MG tablet Take 10 mg by mouth at bedtime as needed.   Marland Kitchen esomeprazole (NEXIUM) 40 MG capsule TAKE ONE CAPSULE BY MOUTH TWICE DAILY  . fluticasone (FLONASE) 50 MCG/ACT nasal spray USE 2 SPRAYS IN EACH NOSTRIL EVERY DAY  . Fluticasone-Salmeterol (ADVAIR DISKUS) 250-50 MCG/DOSE AEPB Inhale 1 puff into the lungs 2 (two) times daily.  . metoprolol succinate (  TOPROL-XL) 50 MG 24 hr tablet TAKE 1 TABLET BY MOUTH EVERY DAY WITH OR IMMEDIATELY FOLLOWING A MEAL.  . Multiple Vitamin (MULTIVITAMIN) tablet Take 1 tablet by mouth daily.  . naproxen sodium (ANAPROX) 220 MG tablet Take 220 mg by mouth 2 (two) times daily with a meal.  . ondansetron (ZOFRAN) 4 MG tablet Take 1 tablet (4 mg total) by mouth 2 (two) times daily as needed for nausea or vomiting.  Marland Kitchen oxyCODONE (OXY IR/ROXICODONE) 5 MG immediate release tablet Take 5 mg by mouth 2 (two) times daily as needed.  . Probiotic Product (PROBIOTIC DAILY PO) Take by mouth.  . rosuvastatin (CRESTOR) 5 MG tablet TAKE 1 TABLET BY MOUTH EVERY DAY  . venlafaxine XR (EFFEXOR-XR) 150 MG 24 hr capsule TAKE 1 CAPSULE BY MOUTH EVERY DAY WITH BREAKFAST  . levofloxacin (LEVAQUIN) 500 MG tablet Take 1 tablet (500 mg total) by mouth daily.  . predniSONE (DELTASONE) 10 MG tablet Take 6 tablets  x 1 day and then decrease by 1/2 tablet per day until down to zero mg.   No facility-administered encounter medications on file as of 08/25/2015.    Review of Systems  Constitutional: Positive for fatigue. Negative for fever.  HENT: Positive for congestion, postnasal drip, sinus pressure and sore throat (previous sore throat.  better now. ).   Eyes: Negative for discharge and redness.  Respiratory: Positive for cough and shortness of breath. Negative for chest tightness.   Cardiovascular: Negative for chest pain, palpitations and leg swelling.  Gastrointestinal: Positive for nausea. Negative for vomiting, abdominal pain and diarrhea.  Musculoskeletal: Negative for myalgias and joint swelling.  Skin: Negative for color change and rash.  Neurological: Positive for dizziness and light-headedness.  Psychiatric/Behavioral: Negative for dysphoric mood and agitation.       Objective:    Physical Exam  Constitutional: She appears well-developed and well-nourished. No distress.  HENT:  Mouth/Throat: Oropharynx is clear and moist.  Nares reveals slightly erythematous turbinates.  Minimal tenderness to palpation over the sinuses.  TMs visualized without erythema.    Eyes: Conjunctivae are normal. Right eye exhibits no discharge. Left eye exhibits no discharge.  Neck: Neck supple.  Cardiovascular: Normal rate and regular rhythm.   Pulmonary/Chest: Breath sounds normal. No respiratory distress. She has no wheezes.  Some increased cough with forced expiration.    Abdominal: Bowel sounds are normal.  Musculoskeletal: She exhibits no edema or tenderness.  Lymphadenopathy:    She has no cervical adenopathy.  Skin: No rash noted. No erythema.  Psychiatric: She has a normal mood and affect. Her behavior is normal.    BP 103/71 mmHg  Pulse 68  Temp(Src) 98.5 F (36.9 C) (Oral)  Ht 5\' 5"  (1.651 m)  Wt 257 lb (116.574 kg)  BMI 42.77 kg/m2  SpO2 100% Wt Readings from Last 3 Encounters:    08/25/15 257 lb (116.574 kg)  07/21/15 254 lb (115.214 kg)  05/26/15 253 lb (114.76 kg)     Lab Results  Component Value Date   WBC 9.6 04/28/2015   HGB 12.0 04/28/2015   HCT 36.7 04/28/2015   PLT 441.0* 04/28/2015   GLUCOSE 106* 04/28/2015   CHOL 184 04/28/2015   TRIG 114.0 04/28/2015   HDL 52.90 04/28/2015   LDLDIRECT 172.8 09/17/2013   LDLCALC 108* 04/28/2015   ALT 29 05/12/2015   AST 23 05/12/2015   NA 141 04/28/2015   K 4.3 04/28/2015   CL 106 04/28/2015   CREATININE 1.01 05/12/2015   BUN 13  04/28/2015   CO2 27 04/28/2015   TSH 1.88 04/28/2015   INR 0.9 07/02/2013   HGBA1C 6.0 04/28/2015   MICROALBUR 1.1 04/28/2015        Assessment & Plan:   Problem List Items Addressed This Visit    Cough - Primary    See above.  Treat infection.  Check cxr.  Follow.        Relevant Orders   DG Chest 2 View (Completed)   GERD (gastroesophageal reflux disease)    No increased acid reflux.  Continue current medication regimen.        Hypertension    Blood pressure has been doing well.  Same medication regimen.  Follow pressures.        Sinusitis    Sinusitis/URI.  Symptoms and exam as outlined.  Treat with levaquin.  Increased advair to bid.  Check cxr.  Saline nasal spray and nasacort nasal spray as outlined.  Prednisone taper as directed.  Mucinex/robitussin as directed.  Rest.  Fluids.  Follow.        Relevant Medications   levofloxacin (LEVAQUIN) 500 MG tablet   predniSONE (DELTASONE) 10 MG tablet    Other Visit Diagnoses    SOB (shortness of breath)        Relevant Orders    DG Chest 2 View (Completed)        Einar Pheasant, MD

## 2015-08-25 NOTE — Progress Notes (Signed)
Pre visit review using our clinic review tool, if applicable. No additional management support is needed unless otherwise documented below in the visit note. 

## 2015-08-26 ENCOUNTER — Encounter: Payer: Self-pay | Admitting: Internal Medicine

## 2015-08-26 NOTE — Assessment & Plan Note (Signed)
Sinusitis/URI.  Symptoms and exam as outlined.  Treat with levaquin.  Increased advair to bid.  Check cxr.  Saline nasal spray and nasacort nasal spray as outlined.  Prednisone taper as directed.  Mucinex/robitussin as directed.  Rest.  Fluids.  Follow.

## 2015-08-26 NOTE — Assessment & Plan Note (Signed)
Blood pressure has been doing well.  Same medication regimen.  Follow pressures.   

## 2015-08-26 NOTE — Assessment & Plan Note (Signed)
No increased acid reflux.  Continue current medication regimen.

## 2015-08-26 NOTE — Assessment & Plan Note (Signed)
See above.  Treat infection.  Check cxr.  Follow.

## 2015-09-05 ENCOUNTER — Encounter: Payer: Self-pay | Admitting: Internal Medicine

## 2015-09-08 MED ORDER — FIRST-DUKES MOUTHWASH MT SUSP: OROMUCOSAL | Status: DC

## 2015-09-08 NOTE — Telephone Encounter (Signed)
rx sent in for Dukes mouthwash 

## 2015-09-11 ENCOUNTER — Other Ambulatory Visit (INDEPENDENT_AMBULATORY_CARE_PROVIDER_SITE_OTHER): Payer: BLUE CROSS/BLUE SHIELD

## 2015-09-11 DIAGNOSIS — R739 Hyperglycemia, unspecified: Secondary | ICD-10-CM

## 2015-09-11 DIAGNOSIS — I10 Essential (primary) hypertension: Secondary | ICD-10-CM

## 2015-09-11 DIAGNOSIS — E7800 Pure hypercholesterolemia, unspecified: Secondary | ICD-10-CM | POA: Diagnosis not present

## 2015-09-11 DIAGNOSIS — Z0000 Encounter for general adult medical examination without abnormal findings: Secondary | ICD-10-CM

## 2015-09-11 DIAGNOSIS — D473 Essential (hemorrhagic) thrombocythemia: Secondary | ICD-10-CM

## 2015-09-11 DIAGNOSIS — D75839 Thrombocytosis, unspecified: Secondary | ICD-10-CM

## 2015-09-11 LAB — BASIC METABOLIC PANEL
BUN: 20 mg/dL (ref 6–23)
CALCIUM: 8.9 mg/dL (ref 8.4–10.5)
CO2: 24 meq/L (ref 19–32)
CREATININE: 1.07 mg/dL (ref 0.40–1.20)
Chloride: 108 mEq/L (ref 96–112)
GFR: 54.63 mL/min — AB (ref >60.00)
Glucose, Bld: 98 mg/dL (ref 70–99)
Potassium: 4 mEq/L (ref 3.5–5.1)
SODIUM: 141 meq/L (ref 135–145)

## 2015-09-11 LAB — HEPATIC FUNCTION PANEL
ALBUMIN: 3.7 g/dL (ref 3.5–5.2)
ALT: 20 U/L (ref 0–35)
AST: 20 U/L (ref 0–37)
Alkaline Phosphatase: 87 U/L (ref 39–117)
BILIRUBIN TOTAL: 0.3 mg/dL (ref 0.2–1.2)
Bilirubin, Direct: 0.1 mg/dL (ref 0.0–0.3)
TOTAL PROTEIN: 7.1 g/dL (ref 6.0–8.3)

## 2015-09-11 LAB — LIPID PANEL
CHOLESTEROL: 182 mg/dL (ref 0–200)
HDL: 55 mg/dL (ref >39.00)
LDL CALC: 106 mg/dL — AB (ref 0–99)
NonHDL: 127.43
Total CHOL/HDL Ratio: 3
Triglycerides: 105 mg/dL (ref 0.0–149.0)
VLDL: 21 mg/dL (ref 0.0–40.0)

## 2015-09-11 LAB — CBC WITH DIFFERENTIAL/PLATELET
BASOS ABS: 0 10*3/uL (ref 0.0–0.1)
Basophils Relative: 0.4 % (ref 0.0–3.0)
EOS PCT: 1.9 % (ref 0.0–5.0)
Eosinophils Absolute: 0.2 10*3/uL (ref 0.0–0.7)
HCT: 38.3 % (ref 36.0–46.0)
Hemoglobin: 12.3 g/dL (ref 12.0–15.0)
Lymphocytes Relative: 26.6 % (ref 12.0–46.0)
Lymphs Abs: 3 10*3/uL (ref 0.7–4.0)
MCHC: 32 g/dL (ref 30.0–36.0)
MCV: 84.8 fl (ref 78.0–100.0)
MONOS PCT: 6.8 % (ref 3.0–12.0)
Monocytes Absolute: 0.8 10*3/uL (ref 0.1–1.0)
Neutro Abs: 7.2 10*3/uL (ref 1.4–7.7)
Neutrophils Relative %: 64.3 % (ref 43.0–77.0)
Platelets: 417 10*3/uL — ABNORMAL HIGH (ref 150.0–400.0)
RBC: 4.52 Mil/uL (ref 3.87–5.11)
RDW: 15.1 % (ref 11.5–15.5)
WBC: 11.3 10*3/uL — AB (ref 4.0–10.5)

## 2015-09-11 LAB — HEMOGLOBIN A1C: HEMOGLOBIN A1C: 6.1 % (ref 4.6–6.5)

## 2015-09-12 ENCOUNTER — Other Ambulatory Visit: Payer: Self-pay | Admitting: Internal Medicine

## 2015-09-12 DIAGNOSIS — D72829 Elevated white blood cell count, unspecified: Secondary | ICD-10-CM

## 2015-09-12 DIAGNOSIS — I10 Essential (primary) hypertension: Secondary | ICD-10-CM

## 2015-09-12 LAB — HEPATITIS C ANTIBODY: HCV Ab: NEGATIVE

## 2015-09-12 NOTE — Progress Notes (Signed)
Orders placed for f/u labs.  

## 2015-09-22 ENCOUNTER — Other Ambulatory Visit (INDEPENDENT_AMBULATORY_CARE_PROVIDER_SITE_OTHER): Payer: BLUE CROSS/BLUE SHIELD

## 2015-09-22 DIAGNOSIS — I10 Essential (primary) hypertension: Secondary | ICD-10-CM

## 2015-09-22 DIAGNOSIS — D72829 Elevated white blood cell count, unspecified: Secondary | ICD-10-CM | POA: Diagnosis not present

## 2015-09-23 LAB — CBC WITH DIFFERENTIAL/PLATELET
BASOS ABS: 0.1 10*3/uL (ref 0.0–0.1)
Basophils Relative: 0.7 % (ref 0.0–3.0)
Eosinophils Absolute: 0.3 10*3/uL (ref 0.0–0.7)
Eosinophils Relative: 3.5 % (ref 0.0–5.0)
HEMATOCRIT: 37 % (ref 36.0–46.0)
HEMOGLOBIN: 12.1 g/dL (ref 12.0–15.0)
LYMPHS PCT: 40.8 % (ref 12.0–46.0)
Lymphs Abs: 3.3 10*3/uL (ref 0.7–4.0)
MCHC: 32.6 g/dL (ref 30.0–36.0)
MCV: 83.4 fl (ref 78.0–100.0)
MONOS PCT: 6.7 % (ref 3.0–12.0)
Monocytes Absolute: 0.5 10*3/uL (ref 0.1–1.0)
NEUTROS ABS: 3.9 10*3/uL (ref 1.4–7.7)
Neutrophils Relative %: 48.3 % (ref 43.0–77.0)
PLATELETS: 431 10*3/uL — AB (ref 150.0–400.0)
RBC: 4.44 Mil/uL (ref 3.87–5.11)
RDW: 15.4 % (ref 11.5–15.5)
WBC: 8.1 10*3/uL (ref 4.0–10.5)

## 2015-09-23 LAB — BASIC METABOLIC PANEL
BUN: 16 mg/dL (ref 6–23)
CHLORIDE: 108 meq/L (ref 96–112)
CO2: 24 meq/L (ref 19–32)
CREATININE: 1.03 mg/dL (ref 0.40–1.20)
Calcium: 9.5 mg/dL (ref 8.4–10.5)
GFR: 57.08 mL/min — ABNORMAL LOW (ref >60.00)
GLUCOSE: 107 mg/dL — AB (ref 70–99)
Potassium: 4.2 mEq/L (ref 3.5–5.1)
Sodium: 139 mEq/L (ref 135–145)

## 2015-09-24 ENCOUNTER — Encounter: Payer: Self-pay | Admitting: Internal Medicine

## 2015-10-20 ENCOUNTER — Encounter: Payer: Self-pay | Admitting: Internal Medicine

## 2015-10-20 ENCOUNTER — Ambulatory Visit (INDEPENDENT_AMBULATORY_CARE_PROVIDER_SITE_OTHER): Payer: BLUE CROSS/BLUE SHIELD | Admitting: Internal Medicine

## 2015-10-20 VITALS — BP 138/78 | HR 83 | Temp 97.6°F | Resp 18 | Ht 65.0 in | Wt 253.0 lb

## 2015-10-20 DIAGNOSIS — G8929 Other chronic pain: Secondary | ICD-10-CM

## 2015-10-20 DIAGNOSIS — R0981 Nasal congestion: Secondary | ICD-10-CM

## 2015-10-20 DIAGNOSIS — I10 Essential (primary) hypertension: Secondary | ICD-10-CM

## 2015-10-20 DIAGNOSIS — R739 Hyperglycemia, unspecified: Secondary | ICD-10-CM

## 2015-10-20 DIAGNOSIS — E7800 Pure hypercholesterolemia, unspecified: Secondary | ICD-10-CM

## 2015-10-20 DIAGNOSIS — R109 Unspecified abdominal pain: Secondary | ICD-10-CM

## 2015-10-20 DIAGNOSIS — Z658 Other specified problems related to psychosocial circumstances: Secondary | ICD-10-CM

## 2015-10-20 DIAGNOSIS — D75839 Thrombocytosis, unspecified: Secondary | ICD-10-CM

## 2015-10-20 DIAGNOSIS — R51 Headache: Secondary | ICD-10-CM

## 2015-10-20 DIAGNOSIS — K219 Gastro-esophageal reflux disease without esophagitis: Secondary | ICD-10-CM | POA: Diagnosis not present

## 2015-10-20 DIAGNOSIS — D473 Essential (hemorrhagic) thrombocythemia: Secondary | ICD-10-CM

## 2015-10-20 DIAGNOSIS — F439 Reaction to severe stress, unspecified: Secondary | ICD-10-CM

## 2015-10-20 NOTE — Progress Notes (Signed)
Patient ID: Nicole Sims, female   DOB: 1950-11-30, 65 y.o.   MRN: [id number]   Subjective:    Patient ID: Nicole Sims, female    DOB: 1951/08/15, 65 y.o.   MRN: [id number]  HPI  Patient with past history of hypercholesterolemia, GERD, depression and hypertension.  She comes in today to follow up on these issues.  She reports having some increased nasal congestion and drainage.  Used afrin for three days.  Is better.  No increased cough or chest congestion.  Breathing stable.  Saw neurology for her headaches.  On topamax now.  Has imitrex if needed.  She is eating.  Reports increased abdominal pain and right mid to lower abdominal pain.  Previous diarrhea, but this has resolved.  Bowels normal now.  No dysuria.     Past Medical History  Diagnosis Date  . Hypertension   . Hypercholesterolemia   . Palpitations   . Depression   . GERD (gastroesophageal reflux disease)   . Environmental allergies   . Urinary incontinence     mixed  . Osteoarthritis   . Diverticulosis   . Anemia   . Thrombocytosis ([place])   . Chronic headaches   . Hyperglycemia   . Asthma    Past Surgical History  Procedure Laterality Date  . Tonsillectomy  1962  . Cervical cone biopsy      CIS  . Vaginal hysterectomy  1974    abnormal pap and carcinoma in situ  . Shoulder surgery  11/17/05  . Cholecystectomy    . Knee arthroscopy  08/13/08  . Rotator cuff repair      bilateral  . Knee replacement and revision      left  . Breast cyst aspiration Bilateral 2005    approximate year   Family History  Problem Relation Age of Onset  . Diabetes Mellitus II Father   . Thyroid disease Father   . Breast cancer Maternal Aunt   . Colon cancer Neg Hx    Social History   Social History  . Marital Status: Married    Spouse Name: N/A  . Number of Children: N/A  . Years of Education: N/A   Social History Main Topics  . Smoking status: Former Smoker    Quit date: 08/17/1983  . Smokeless tobacco: Never Used  .  Alcohol Use: 0.0 oz/week    0 Standard drinks or equivalent per week     Comment: rarely  . Drug Use: No  . Sexual Activity: Not Asked   Other Topics Concern  . None   Social History Narrative    Outpatient Encounter Prescriptions as of 10/20/2015  Medication Sig  . albuterol (PROVENTIL HFA;VENTOLIN HFA) 108 (90 BASE) MCG/ACT inhaler Inhale 2 puffs into the lungs every 4 (four) hours as needed.  . Biotin 1000 MCG tablet Take 1,000 mcg by mouth daily.  . calcium gluconate 500 MG tablet Take 500 mg by mouth 2 (two) times daily.  . cetirizine (ZYRTEC) 10 MG tablet Take 10 mg by mouth daily.  . cyclobenzaprine (FLEXERIL) 10 MG tablet Take 10 mg by mouth at bedtime as needed.   Marland Kitchen esomeprazole (NEXIUM) 40 MG capsule TAKE ONE CAPSULE BY MOUTH TWICE DAILY  . fluticasone (FLONASE) 50 MCG/ACT nasal spray USE 2 SPRAYS IN EACH NOSTRIL EVERY DAY  . Fluticasone-Salmeterol (ADVAIR DISKUS) 250-50 MCG/DOSE AEPB Inhale 1 puff into the lungs 2 (two) times daily.  . metoprolol succinate (TOPROL-XL) 50 MG 24 hr tablet TAKE 1 TABLET BY  MOUTH EVERY DAY WITH OR IMMEDIATELY FOLLOWING A MEAL.  . Multiple Vitamin (MULTIVITAMIN) tablet Take 1 tablet by mouth daily.  . naproxen sodium (ANAPROX) 220 MG tablet Take 220 mg by mouth 2 (two) times daily with a meal.  . ondansetron (ZOFRAN) 4 MG tablet Take 1 tablet (4 mg total) by mouth 2 (two) times daily as needed for nausea or vomiting.  Marland Kitchen oxyCODONE (OXY IR/ROXICODONE) 5 MG immediate release tablet Take 5 mg by mouth 2 (two) times daily as needed.  . Probiotic Product (PROBIOTIC DAILY PO) Take by mouth.  . rosuvastatin (CRESTOR) 5 MG tablet TAKE 1 TABLET BY MOUTH EVERY DAY  . venlafaxine XR (EFFEXOR-XR) 150 MG 24 hr capsule TAKE 1 CAPSULE BY MOUTH EVERY DAY WITH BREAKFAST  . [DISCONTINUED] Diphenhyd-Hydrocort-Nystatin (FIRST-DUKES MOUTHWASH) SUSP 5cc's swish and spit tid  . [DISCONTINUED] levofloxacin (LEVAQUIN) 500 MG tablet Take 1 tablet (500 mg total) by mouth  daily.  . [DISCONTINUED] predniSONE (DELTASONE) 10 MG tablet Take 6 tablets x 1 day and then decrease by 1/2 tablet per day until down to zero mg.   No facility-administered encounter medications on file as of 10/20/2015.    Review of Systems  Constitutional: Negative for appetite change and unexpected weight change.  HENT: Positive for congestion and postnasal drip.   Eyes:       Eyes watering.  Some itching.    Respiratory: Negative for cough, chest tightness and shortness of breath.   Cardiovascular: Negative for chest pain, palpitations and leg swelling.  Gastrointestinal: Positive for abdominal pain. Negative for nausea and vomiting.       Previous diarrhea.  This has resolved.  Only lasted 24 hours.    Genitourinary: Negative for dysuria and difficulty urinating.  Musculoskeletal: Negative for myalgias and joint swelling.  Skin: Negative for color change and rash.  Neurological: Negative for dizziness, light-headedness and headaches.  Psychiatric/Behavioral: Negative for dysphoric mood and agitation.       Objective:    Physical Exam  Constitutional: She appears well-developed and well-nourished. No distress.  HENT:  Nose: Nose normal.  Mouth/Throat: Oropharynx is clear and moist.  Eyes: Conjunctivae are normal. Right eye exhibits no discharge. Left eye exhibits no discharge.  Neck: Neck supple. No thyromegaly present.  Cardiovascular: Normal rate and regular rhythm.   Pulmonary/Chest: Breath sounds normal. No respiratory distress. She has no wheezes.  Abdominal: Soft. Bowel sounds are normal.  Increased pain - right mid abdomen and right lower quadrant.  Fullness right lower abdomen.    Musculoskeletal: She exhibits no edema or tenderness.  Lymphadenopathy:    She has no cervical adenopathy.  Skin: No rash noted. No erythema.  Psychiatric: She has a normal mood and affect. Her behavior is normal.    BP 138/78 mmHg  Pulse 83  Temp(Src) 97.6 F (36.4 C) (Oral)  Resp  18  Ht _0  (1.651 m)  Wt 253 lb (114.76 kg)  BMI 42.10 kg/m2  SpO2 98% Wt Readings from Last 3 Encounters:  10/20/15 253 lb (114.76 kg)  08/25/15 257 lb (116.574 kg)  07/21/15 254 lb (115.214 kg)     Lab Results  Component Value Date   WBC 8.1 09/22/2015   HGB 12.1 09/22/2015   HCT 37.0 09/22/2015   PLT 431.0* 09/22/2015   GLUCOSE 107* 09/22/2015   CHOL 182 09/11/2015   TRIG 105.0 09/11/2015   HDL 55.00 09/11/2015   LDLDIRECT 172.8 09/17/2013   LDLCALC 106* 09/11/2015   ALT 20 09/11/2015   AST  20 09/11/2015   NA 139 09/22/2015   K 4.2 09/22/2015   CL 108 09/22/2015   CREATININE 1.03 09/22/2015   BUN 16 09/22/2015   CO2 24 09/22/2015   TSH 1.88 04/28/2015   INR 0.9 07/02/2013   HGBA1C 6.1 09/11/2015   MICROALBUR 1.1 04/28/2015    Dg Chest 2 View  08/25/2015  CLINICAL DATA:  Two weeks of nonproductive cough and shortness of breath; history of asthma ; initial visit EXAM: CHEST  2 VIEW COMPARISON:  PA and lateral chest x-ray of May 21, 2014 FINDINGS: The lungs are adequately inflated. There is no focal infiltrate. There is no pleural effusion. The heart and pulmonary vascularity are normal. The mediastinum is normal in width. There is no pleural effusion. The bony thorax exhibits no acute abnormality. IMPRESSION: There is no active cardiopulmonary disease. Electronically Signed   By: David  Martinique M.D.   On: 08/25/2015 14:27       Assessment & Plan:   Problem List Items Addressed This Visit    Abdominal pain in female - Primary    Increased abdominal pain and exam as outlined.  Fullness noted on exam.  Will check cbc, met b and urinalysis.  Also check ct abdomen and pelvis.  She wants to wait until Wednesday 10/22/15 for scan.  Is eating.  Follow.       Relevant Orders   Urinalysis, Routine w reflex microscopic (not at [facility])   CBC with Differential/Platelet   Hepatic function panel   Basic metabolic panel   CT Abdomen Pelvis W Contrast   Chronic headaches     Saw neurology.  On topamax now.  Has imitrex if needed.  Follow.        GERD (gastroesophageal reflux disease)    On nexium.  No increased upper symptoms reported.        Hypercholesterolemia    On crestor.  Low cholesterol diet and exercise.  Follow lipid panel and liver function tests.   Lab Results  Component Value Date   CHOL 182 09/11/2015   HDL 55.00 09/11/2015   LDLCALC 106* 09/11/2015   LDLDIRECT 172.8 09/17/2013   TRIG 105.0 09/11/2015   CHOLHDL 3 09/11/2015        Hyperglycemia    Low carb diet and exercise.  Follow metb and a1c.   Lab Results  Component Value Date   HGBA1C 6.1 09/11/2015        Hypertension    Blood pressure under good control.  Continue same medication regimen.  Follow pressures.  Follow metabolic panel.        Nasal congestion    Saline nasal spray and flonase as directed.  Follow.       Severe obesity (BMI >= 40) (HCC)    Discussed diet and exercise.        Stress    Increased stress.  Stable.  Follow.        Thrombocytosis ([place])    Has been evaluated by Dr Ma Hillock.  Spleen ok.  Platelet count stable.  Follow.            Einar Pheasant, MD

## 2015-10-20 NOTE — Progress Notes (Signed)
Pre-visit discussion using our clinic review tool. No additional management support is needed unless otherwise documented below in the visit note.  

## 2015-10-21 ENCOUNTER — Encounter: Payer: Self-pay | Admitting: Internal Medicine

## 2015-10-21 DIAGNOSIS — R0981 Nasal congestion: Secondary | ICD-10-CM | POA: Insufficient documentation

## 2015-10-21 LAB — HEPATIC FUNCTION PANEL
ALK PHOS: 95 U/L (ref 39–117)
ALT: 18 U/L (ref 0–35)
AST: 17 U/L (ref 0–37)
Albumin: 4 g/dL (ref 3.5–5.2)
Bilirubin, Direct: 0.1 mg/dL (ref 0.0–0.3)
TOTAL PROTEIN: 7.4 g/dL (ref 6.0–8.3)
Total Bilirubin: 0.3 mg/dL (ref 0.2–1.2)

## 2015-10-21 LAB — CBC WITH DIFFERENTIAL/PLATELET
BASOS PCT: 0.8 % (ref 0.0–3.0)
Basophils Absolute: 0.1 10*3/uL (ref 0.0–0.1)
Eosinophils Absolute: 0.4 10*3/uL (ref 0.0–0.7)
Eosinophils Relative: 3.4 % (ref 0.0–5.0)
HEMATOCRIT: 37.7 % (ref 36.0–46.0)
Hemoglobin: 12.4 g/dL (ref 12.0–15.0)
LYMPHS PCT: 38.6 % (ref 12.0–46.0)
Lymphs Abs: 4.1 10*3/uL — ABNORMAL HIGH (ref 0.7–4.0)
MCHC: 32.9 g/dL (ref 30.0–36.0)
MCV: 84.2 fl (ref 78.0–100.0)
MONOS PCT: 6.1 % (ref 3.0–12.0)
Monocytes Absolute: 0.6 10*3/uL (ref 0.1–1.0)
NEUTROS ABS: 5.5 10*3/uL (ref 1.4–7.7)
Neutrophils Relative %: 51.1 % (ref 43.0–77.0)
PLATELETS: 431 10*3/uL — AB (ref 150.0–400.0)
RBC: 4.48 Mil/uL (ref 3.87–5.11)
RDW: 15.2 % (ref 11.5–15.5)
WBC: 10.7 10*3/uL — ABNORMAL HIGH (ref 4.0–10.5)

## 2015-10-21 LAB — URINALYSIS, ROUTINE W REFLEX MICROSCOPIC
Bilirubin Urine: NEGATIVE
Hgb urine dipstick: NEGATIVE
Ketones, ur: NEGATIVE
LEUKOCYTES UA: NEGATIVE
Nitrite: NEGATIVE
PH: 6 (ref 5.0–8.0)
RBC / HPF: NONE SEEN (ref 0–?)
Total Protein, Urine: NEGATIVE
Urine Glucose: NEGATIVE
Urobilinogen, UA: 0.2 (ref 0.0–1.0)
WBC, UA: NONE SEEN (ref 0–?)

## 2015-10-21 LAB — BASIC METABOLIC PANEL
BUN: 15 mg/dL (ref 6–23)
CHLORIDE: 108 meq/L (ref 96–112)
CO2: 23 mEq/L (ref 19–32)
Calcium: 9.5 mg/dL (ref 8.4–10.5)
Creatinine, Ser: 0.96 mg/dL (ref 0.40–1.20)
GFR: 61.9 mL/min (ref >60.00)
GLUCOSE: 94 mg/dL (ref 70–99)
POTASSIUM: 4.1 meq/L (ref 3.5–5.1)
Sodium: 141 mEq/L (ref 135–145)

## 2015-10-21 NOTE — Assessment & Plan Note (Signed)
Increased stress.  Stable.  Follow.

## 2015-10-21 NOTE — Assessment & Plan Note (Signed)
Saw neurology.  On topamax now.  Has imitrex if needed.  Follow.

## 2015-10-21 NOTE — Assessment & Plan Note (Signed)
Blood pressure under good control.  Continue same medication regimen.  Follow pressures.  Follow metabolic panel.   

## 2015-10-21 NOTE — Assessment & Plan Note (Signed)
Saline nasal spray and flonase as directed.  Follow.   

## 2015-10-21 NOTE — Assessment & Plan Note (Signed)
Low carb diet and exercise.  Follow metb and a1c.   Lab Results  Component Value Date   HGBA1C 6.1 09/11/2015

## 2015-10-21 NOTE — Assessment & Plan Note (Signed)
Has been evaluated by Dr Ma Hillock.  Spleen ok.  Platelet count stable.  Follow.

## 2015-10-21 NOTE — Assessment & Plan Note (Signed)
On nexium.  No increased upper symptoms reported.

## 2015-10-21 NOTE — Assessment & Plan Note (Signed)
Increased abdominal pain and exam as outlined.  Fullness noted on exam.  Will check cbc, met b and urinalysis.  Also check ct abdomen and pelvis.  She wants to wait until Wednesday 10/22/15 for scan.  Is eating.  Follow.

## 2015-10-21 NOTE — Assessment & Plan Note (Signed)
On crestor.  Low cholesterol diet and exercise.  Follow lipid panel and liver function tests.   Lab Results  Component Value Date   CHOL 182 09/11/2015   HDL 55.00 09/11/2015   LDLCALC 106* 09/11/2015   LDLDIRECT 172.8 09/17/2013   TRIG 105.0 09/11/2015   CHOLHDL 3 09/11/2015

## 2015-10-21 NOTE — Assessment & Plan Note (Signed)
Discussed diet and exercise 

## 2015-10-22 ENCOUNTER — Encounter: Payer: Self-pay | Admitting: Internal Medicine

## 2015-10-23 ENCOUNTER — Ambulatory Visit
Admission: RE | Admit: 2015-10-23 | Discharge: 2015-10-23 | Disposition: A | Discharge status: Home / Self Care | Payer: BLUE CROSS/BLUE SHIELD | Source: Ambulatory Visit | Attending: Internal Medicine | Admitting: Internal Medicine

## 2015-10-23 DIAGNOSIS — N261 Atrophy of kidney (terminal): Secondary | ICD-10-CM | POA: Diagnosis not present

## 2015-10-23 DIAGNOSIS — R109 Unspecified abdominal pain: Secondary | ICD-10-CM | POA: Insufficient documentation

## 2015-10-23 MED ORDER — IOHEXOL 300 MG/ML  SOLN
100.0000 mL | Freq: Once | INTRAMUSCULAR | Status: AC | PRN
  Administered 2015-10-23: 100 mL via INTRAVENOUS

## 2015-10-24 ENCOUNTER — Other Ambulatory Visit: Payer: Self-pay | Admitting: Internal Medicine

## 2015-10-24 DIAGNOSIS — R1031 Right lower quadrant pain: Secondary | ICD-10-CM

## 2015-10-24 NOTE — Progress Notes (Signed)
Order placed for GI referral.   

## 2015-11-11 ENCOUNTER — Other Ambulatory Visit: Payer: Self-pay | Admitting: Internal Medicine

## 2015-11-14 ENCOUNTER — Other Ambulatory Visit: Payer: Self-pay | Admitting: Neurology

## 2015-11-14 DIAGNOSIS — R42 Dizziness and giddiness: Secondary | ICD-10-CM

## 2015-11-14 DIAGNOSIS — M542 Cervicalgia: Secondary | ICD-10-CM

## 2015-12-04 ENCOUNTER — Ambulatory Visit
Admission: RE | Admit: 2015-12-04 | Discharge: 2015-12-04 | Disposition: A | Discharge status: Home / Self Care | Payer: BLUE CROSS/BLUE SHIELD | Source: Ambulatory Visit | Attending: Neurology | Admitting: Neurology

## 2015-12-04 ENCOUNTER — Ambulatory Visit: Payer: BLUE CROSS/BLUE SHIELD

## 2015-12-04 DIAGNOSIS — M5021 Other cervical disc displacement,  high cervical region: Secondary | ICD-10-CM | POA: Diagnosis not present

## 2015-12-04 DIAGNOSIS — R292 Abnormal reflex: Secondary | ICD-10-CM | POA: Insufficient documentation

## 2015-12-04 DIAGNOSIS — M47812 Spondylosis without myelopathy or radiculopathy, cervical region: Secondary | ICD-10-CM | POA: Insufficient documentation

## 2015-12-04 DIAGNOSIS — M542 Cervicalgia: Secondary | ICD-10-CM | POA: Diagnosis present

## 2015-12-04 DIAGNOSIS — R42 Dizziness and giddiness: Secondary | ICD-10-CM | POA: Diagnosis not present

## 2015-12-04 DIAGNOSIS — R2689 Other abnormalities of gait and mobility: Secondary | ICD-10-CM | POA: Diagnosis not present

## 2016-01-08 ENCOUNTER — Ambulatory Visit (INDEPENDENT_AMBULATORY_CARE_PROVIDER_SITE_OTHER): Payer: BLUE CROSS/BLUE SHIELD | Admitting: Internal Medicine

## 2016-01-08 ENCOUNTER — Encounter: Payer: Self-pay | Admitting: Internal Medicine

## 2016-01-08 VITALS — BP 104/70 | HR 80 | Temp 97.7°F | Ht 65.0 in | Wt 255.2 lb

## 2016-01-08 DIAGNOSIS — D473 Essential (hemorrhagic) thrombocythemia: Secondary | ICD-10-CM | POA: Diagnosis not present

## 2016-01-08 DIAGNOSIS — E7800 Pure hypercholesterolemia, unspecified: Secondary | ICD-10-CM

## 2016-01-08 DIAGNOSIS — R519 Headache, unspecified: Secondary | ICD-10-CM

## 2016-01-08 DIAGNOSIS — R05 Cough: Secondary | ICD-10-CM

## 2016-01-08 DIAGNOSIS — F439 Reaction to severe stress, unspecified: Secondary | ICD-10-CM

## 2016-01-08 DIAGNOSIS — R059 Cough, unspecified: Secondary | ICD-10-CM

## 2016-01-08 DIAGNOSIS — D75839 Thrombocytosis, unspecified: Secondary | ICD-10-CM

## 2016-01-08 DIAGNOSIS — Z658 Other specified problems related to psychosocial circumstances: Secondary | ICD-10-CM | POA: Diagnosis not present

## 2016-01-08 DIAGNOSIS — I10 Essential (primary) hypertension: Secondary | ICD-10-CM | POA: Diagnosis not present

## 2016-01-08 DIAGNOSIS — R51 Headache: Secondary | ICD-10-CM

## 2016-01-08 DIAGNOSIS — R739 Hyperglycemia, unspecified: Secondary | ICD-10-CM

## 2016-01-08 MED ORDER — ALBUTEROL SULFATE HFA 108 (90 BASE) MCG/ACT IN AERS: 2.0000 | INHALATION_SPRAY | Freq: Four times a day (QID) | RESPIRATORY_TRACT | Status: DC | PRN

## 2016-01-08 MED ORDER — PREDNISONE 10 MG PO TABS: ORAL_TABLET | ORAL | Status: DC

## 2016-01-08 NOTE — Progress Notes (Signed)
Pre visit review using our clinic review tool, if applicable. No additional management support is needed unless otherwise documented below in the visit note. 

## 2016-01-08 NOTE — Progress Notes (Signed)
Patient ID: Nicole Sims, female   DOB: [birth year], 65 y.o.   MRN: [id number]   Subjective:    Patient ID: Nicole Sims, female    DOB: Nov 03, 1950, 65 y.o.   MRN: [id number]  HPI  Patient here for a scheduled follow up.  She saw GI.  See note.  Still with sore throat.  They performed a culture.  Still with some bowel change.  Planning follow up and was told if persistent problems, would need colonoscopy.  She also saw neurology.  Had EEG - normal.  See note.  Referred to Dr Sharlet Salina for her back.  She is having increased nasal congestion and throat congestion.  Increased cough.  Some chest congestion.  No chest pain.  No increased abdominal pain.  Bowels unchanged.  Using advair and flonase.  Taking mucinex.     Past Medical History  Diagnosis Date  . Hypertension   . Hypercholesterolemia   . Palpitations   . Depression   . GERD (gastroesophageal reflux disease)   . Environmental allergies   . Urinary incontinence     mixed  . Osteoarthritis   . Diverticulosis   . Anemia   . Thrombocytosis ([place])   . Chronic headaches   . Hyperglycemia   . Asthma    Past Surgical History  Procedure Laterality Date  . Tonsillectomy  1962  . Cervical cone biopsy      CIS  . Vaginal hysterectomy  1974    abnormal pap and carcinoma in situ  . Shoulder surgery  11/17/05  . Cholecystectomy    . Knee arthroscopy  08/13/08  . Rotator cuff repair      bilateral  . Knee replacement and revision      left  . Breast cyst aspiration Bilateral 2005    approximate year   Family History  Problem Relation Age of Onset  . Diabetes Mellitus II Father   . Thyroid disease Father   . Breast cancer Maternal Aunt   . Colon cancer Neg Hx    Social History   Social History  . Marital Status: Married    Spouse Name: N/A  . Number of Children: N/A  . Years of Education: N/A   Social History Main Topics  . Smoking status: Former Smoker    Quit date: 08/17/1983  . Smokeless tobacco: Never Used  .  Alcohol Use: 0.0 oz/week    0 Standard drinks or equivalent per week     Comment: rarely  . Drug Use: No  . Sexual Activity: Not Asked   Other Topics Concern  . None   Social History Narrative    Outpatient Encounter Prescriptions as of 01/08/2016  Medication Sig  . albuterol (PROVENTIL HFA;VENTOLIN HFA) 108 (90 BASE) MCG/ACT inhaler Inhale 2 puffs into the lungs every 4 (four) hours as needed.  Marland Kitchen aspirin EC 81 MG tablet Take 81 mg by mouth daily.  . Biotin 1000 MCG tablet Take 1,000 mcg by mouth daily.  . calcium gluconate 500 MG tablet Take 500 mg by mouth 2 (two) times daily.  . cetirizine (ZYRTEC) 10 MG tablet Take 10 mg by mouth daily.  . cyclobenzaprine (FLEXERIL) 10 MG tablet Take 10 mg by mouth at bedtime as needed.   Marland Kitchen esomeprazole (NEXIUM) 40 MG capsule TAKE 1 CAPSULE BY MOUTH TWICE DAILY  . fluticasone (FLONASE) 50 MCG/ACT nasal spray USE 2 SPRAYS IN EACH NOSTRIL EVERY DAY  . Fluticasone-Salmeterol (ADVAIR DISKUS) 250-50 MCG/DOSE AEPB Inhale 1 puff into the  lungs 2 (two) times daily.  . metoprolol succinate (TOPROL-XL) 50 MG 24 hr tablet TAKE 1 TABLET BY MOUTH EVERY DAY WITH OR IMMEDIATELY FOLLOWING A MEAL.  . Multiple Vitamin (MULTIVITAMIN) tablet Take 1 tablet by mouth daily.  . naproxen sodium (ANAPROX) 220 MG tablet Take 220 mg by mouth 2 (two) times daily with a meal.  . ondansetron (ZOFRAN) 4 MG tablet Take 1 tablet (4 mg total) by mouth 2 (two) times daily as needed for nausea or vomiting.  Marland Kitchen oxyCODONE (OXY IR/ROXICODONE) 5 MG immediate release tablet Take 5 mg by mouth 2 (two) times daily as needed.  . Probiotic Product (PROBIOTIC DAILY PO) Take by mouth.  . rosuvastatin (CRESTOR) 5 MG tablet TAKE 1 TABLET BY MOUTH EVERY DAY  . SUMAtriptan (IMITREX) 100 MG tablet Take 100 mg by mouth every 2 (two) hours as needed for migraine. May repeat in 2 hours if headache persists or recurs.  . topiramate (TOPAMAX) 50 MG tablet Take 1 tablet by mouth 2 (two) times daily.  Marland Kitchen  venlafaxine XR (EFFEXOR-XR) 150 MG 24 hr capsule TAKE 1 CAPSULE BY MOUTH EVERY DAY WITH BREAKFAST  . VITAMIN E PO Take 1 tablet by mouth daily.  Marland Kitchen albuterol (PROVENTIL HFA;VENTOLIN HFA) 108 (90 Base) MCG/ACT inhaler Inhale 2 puffs into the lungs every 6 (six) hours as needed for wheezing or shortness of breath.  . predniSONE (DELTASONE) 10 MG tablet Take 6 tablets x 1 day and then decrease by 1/2 tablet per day until down to zero mg   No facility-administered encounter medications on file as of 01/08/2016.    Review of Systems  Constitutional: Negative for appetite change and unexpected weight change.  HENT: Positive for congestion. Negative for sinus pressure.   Eyes: Negative for pain and discharge.  Respiratory: Positive for cough. Negative for chest tightness and shortness of breath.        Chest congestion.   Cardiovascular: Negative for chest pain, palpitations and leg swelling.  Gastrointestinal: Negative for nausea, vomiting, abdominal pain and diarrhea.  Genitourinary: Negative for dysuria and difficulty urinating.  Musculoskeletal: Positive for back pain. Negative for joint swelling.  Skin: Negative for color change and rash.  Neurological: Negative for light-headedness and headaches.  Psychiatric/Behavioral: Negative for dysphoric mood and agitation.       Increased stress.         Objective:    Physical Exam  Constitutional: She appears well-developed and well-nourished. No distress.  HENT:  Nose: Nose normal.  Mouth/Throat: Oropharynx is clear and moist.  Neck: Neck supple. No thyromegaly present.  Cardiovascular: Normal rate and regular rhythm.   Pulmonary/Chest: Breath sounds normal. No respiratory distress. She has no wheezes.  Increased cough with forced expiration.   Abdominal: Soft. Bowel sounds are normal. There is no tenderness.  Musculoskeletal: She exhibits no edema or tenderness.  Lymphadenopathy:    She has no cervical adenopathy.  Skin: No rash noted.  No erythema.  Psychiatric: She has a normal mood and affect. Her behavior is normal.    BP 104/70 mmHg  Pulse 80  Temp(Src) 97.7 F (36.5 C) (Oral)  Ht '5\' 5"'$  (1.651 m)  Wt 255 lb 3.2 oz (115.758 kg)  BMI 42.47 kg/m2  SpO2 97% Wt Readings from Last 3 Encounters:  01/08/16 255 lb 3.2 oz (115.758 kg)  10/20/15 253 lb (114.76 kg)  08/25/15 257 lb (116.574 kg)     Lab Results  Component Value Date   WBC 10.7* 10/20/2015   HGB 12.4  10/20/2015   HCT 37.7 10/20/2015   PLT 431.0* 10/20/2015   GLUCOSE 94 10/20/2015   CHOL 182 09/11/2015   TRIG 105.0 09/11/2015   HDL 55.00 09/11/2015   LDLDIRECT 172.8 09/17/2013   LDLCALC 106* 09/11/2015   ALT 18 10/20/2015   AST 17 10/20/2015   NA 141 10/20/2015   K 4.1 10/20/2015   CL 108 10/20/2015   CREATININE 0.96 10/20/2015   BUN 15 10/20/2015   CO2 23 10/20/2015   TSH 1.88 04/28/2015   INR 0.9 07/02/2013   HGBA1C 6.1 09/11/2015   MICROALBUR 1.1 04/28/2015    Mr Cervical Spine Wo Contrast  12/04/2015  CLINICAL DATA:  Chronic neck pain. Stiffness. Hand numbness and weakness. Dizziness. EXAM: MRI CERVICAL SPINE WITHOUT CONTRAST TECHNIQUE: Multiplanar, multisequence MR imaging of the cervical spine was performed. No intravenous contrast was administered. COMPARISON:  MRI cervical spine 08/02/2011. FINDINGS: Alignment: Anatomic except for 2 mm anterolisthesis C4-5 which is facet mediated. Vertebrae: Normal. Cord: Mild stenosis with slight flattening C4-C5. No abnormal cord signal. Posterior Fossa: Normal. Vertebral Arteries: BILATERAL patent.  RIGHT slightly smaller. Paraspinal tissues: Unremarkable. Disc levels: The individual disc spaces were examined as follows: C2-3:  Normal. C3-4: Central protrusion. Mild facet arthropathy. Slight effacement anterior subarachnoid space. No cord flattening. LEFT-sided foraminal narrowing due to uncinate spurring could affect the LEFT C4 nerve root. C4-5: Severe RIGHT and mild LEFT facet arthropathy. 2 mm  anterolisthesis with annular bulging. BILATERAL foraminal narrowing could affect either C5 nerve root, worse on the RIGHT. C5-6:  Annular bulging.  No impingement. C6-7:  Annular bulging.  No impingement. C7-T1:  Annular bulging.  No impingement. Compared with priors, mild progression of disease at C3-4 and C4-5 since 2012. IMPRESSION: Multilevel spondylosis as described. Potentially symptomatic neural impingement at C3-4 LEFT greater than RIGHT, and C4-5 RIGHT greater than LEFT. Electronically Signed   By: Staci Righter M.D.   On: 12/04/2015 17:00       Assessment & Plan:   Problem List Items Addressed This Visit    Chronic headaches    Seeing neurology.  Stable.        Relevant Medications   topiramate (TOPAMAX) 50 MG tablet   SUMAtriptan (IMITREX) 100 MG tablet   aspirin EC 81 MG tablet   Cough    Cough with chest congestion and nasal congestion as outlined.  Continue advair.  Albuterol inhaler refills.  Continue mucinex and flonase.  Hold on abx.  Treat with prednisone taper.  Follow.  Discussed side effects of prednisone.        Hypercholesterolemia    On crestor.  Low cholesterol diet and exercise.  Follow lipid panel and liver function tests.   Lab Results  Component Value Date   CHOL 182 09/11/2015   HDL 55.00 09/11/2015   LDLCALC 106* 09/11/2015   LDLDIRECT 172.8 09/17/2013   TRIG 105.0 09/11/2015   CHOLHDL 3 09/11/2015        Relevant Medications   aspirin EC 81 MG tablet   Other Relevant Orders   Lipid panel   Hepatic function panel   Hyperglycemia    Low carb diet and exercise.  Follow met b and a1c.       Relevant Orders   Hemoglobin A1c   Hypertension    Blood pressure under good control.  Continue same medication regimen.  Follow pressures.  Follow metabolic panel.        Relevant Medications   aspirin EC 81 MG tablet   Other Relevant  Orders   Basic metabolic panel   Severe obesity (BMI >= 40) (HCC)    Diet and exercise.        Stress     Increased stress.  Discussed with her today.  Follow.  Hold on making changes.        Thrombocytosis (HCC) - Primary    Platelet count stable on last check in 10/2015 (431).  Follow.       Relevant Orders   CBC with Differential/Platelet       Einar Pheasant, MD

## 2016-01-09 ENCOUNTER — Ambulatory Visit: Payer: BLUE CROSS/BLUE SHIELD | Admitting: Internal Medicine

## 2016-01-09 ENCOUNTER — Encounter: Payer: Self-pay | Admitting: Internal Medicine

## 2016-01-09 NOTE — Assessment & Plan Note (Signed)
Blood pressure under good control.  Continue same medication regimen.  Follow pressures.  Follow metabolic panel.   

## 2016-01-09 NOTE — Assessment & Plan Note (Signed)
Diet and exercise.   

## 2016-01-09 NOTE — Assessment & Plan Note (Signed)
On crestor.  Low cholesterol diet and exercise.  Follow lipid panel and liver function tests.   Lab Results  Component Value Date   CHOL 182 09/11/2015   HDL 55.00 09/11/2015   LDLCALC 106* 09/11/2015   LDLDIRECT 172.8 09/17/2013   TRIG 105.0 09/11/2015   CHOLHDL 3 09/11/2015

## 2016-01-09 NOTE — Assessment & Plan Note (Signed)
Low carb diet and exercise.  Follow met b and a1c.  

## 2016-01-09 NOTE — Assessment & Plan Note (Signed)
Platelet count stable on last check in 10/2015 (431).  Follow.

## 2016-01-09 NOTE — Assessment & Plan Note (Signed)
Seeing neurology.  Stable.

## 2016-01-09 NOTE — Assessment & Plan Note (Signed)
Cough with chest congestion and nasal congestion as outlined.  Continue advair.  Albuterol inhaler refills.  Continue mucinex and flonase.  Hold on abx.  Treat with prednisone taper.  Follow.  Discussed side effects of prednisone.

## 2016-01-09 NOTE — Assessment & Plan Note (Signed)
Increased stress.  Discussed with her today.  Follow.  Hold on making changes.

## 2016-01-13 ENCOUNTER — Telehealth: Payer: Self-pay | Admitting: *Deleted

## 2016-01-13 NOTE — Telephone Encounter (Signed)
Patient stated that she has been taking prednisone since Thursday for her chest inflammation, she was advised to call the office if her symptom worsen of failed to improve. She currently has congestion with green mucus and would like a antibiotic. [facility]  In [place]

## 2016-01-14 MED ORDER — DOXYCYCLINE HYCLATE 100 MG PO TABS: 100.0000 mg | ORAL_TABLET | Freq: Two times a day (BID) | ORAL | Status: DC

## 2016-01-14 NOTE — Telephone Encounter (Signed)
Given persistent, confirm pt not allergic to doxycycline.  If no, then doxycycline 100mg  bid x 10 days.  Will need to take a probiotic while on abx.  If persistent symptoms, will need to be evaluated.

## 2016-01-14 NOTE — Telephone Encounter (Signed)
Spoke with the patient, no allergy to doxycycline.  Sent prescription to pharmacy and she was advised to follow up if symptoms didn't resolve. thanks

## 2016-01-14 NOTE — Telephone Encounter (Signed)
Patient was told to use mucinex and flonase and prednisone at [facility]. Please advise with new symptoms. thanks

## 2016-01-19 ENCOUNTER — Telehealth: Payer: Self-pay | Admitting: Internal Medicine

## 2016-01-19 MED ORDER — NYSTATIN 100000 UNIT/ML MT SUSP: 5.0000 mL | Freq: Three times a day (TID) | OROMUCOSAL | Status: DC

## 2016-01-19 NOTE — Telephone Encounter (Signed)
Pt called stating that she has thrush due to one of her medication.. Please advise pt

## 2016-01-19 NOTE — Telephone Encounter (Signed)
Notified the patient of the prescription being sent to the pharmacy

## 2016-01-19 NOTE — Telephone Encounter (Signed)
I sent in rx for nystatin suspension.  Let us know if any problems.

## 2016-01-19 NOTE — Telephone Encounter (Signed)
Please advise for thrush medication, thanks

## 2016-03-05 ENCOUNTER — Ambulatory Visit: Payer: BLUE CROSS/BLUE SHIELD | Admitting: Family Medicine

## 2016-03-22 ENCOUNTER — Other Ambulatory Visit (INDEPENDENT_AMBULATORY_CARE_PROVIDER_SITE_OTHER): Payer: BLUE CROSS/BLUE SHIELD

## 2016-03-22 DIAGNOSIS — I10 Essential (primary) hypertension: Secondary | ICD-10-CM

## 2016-03-22 DIAGNOSIS — D473 Essential (hemorrhagic) thrombocythemia: Secondary | ICD-10-CM | POA: Diagnosis not present

## 2016-03-22 DIAGNOSIS — R739 Hyperglycemia, unspecified: Secondary | ICD-10-CM

## 2016-03-22 DIAGNOSIS — D75839 Thrombocytosis, unspecified: Secondary | ICD-10-CM

## 2016-03-22 DIAGNOSIS — E7800 Pure hypercholesterolemia, unspecified: Secondary | ICD-10-CM

## 2016-03-22 LAB — LIPID PANEL
CHOL/HDL RATIO: 4
CHOLESTEROL: 199 mg/dL (ref 0–200)
HDL: 51.2 mg/dL (ref >39.00)
LDL CALC: 121 mg/dL — AB (ref 0–99)
NonHDL: 148.12
TRIGLYCERIDES: 135 mg/dL (ref 0.0–149.0)
VLDL: 27 mg/dL (ref 0.0–40.0)

## 2016-03-22 LAB — BASIC METABOLIC PANEL
BUN: 16 mg/dL (ref 6–23)
CHLORIDE: 106 meq/L (ref 96–112)
CO2: 26 meq/L (ref 19–32)
Calcium: 9.5 mg/dL (ref 8.4–10.5)
Creatinine, Ser: 1.03 mg/dL (ref 0.40–1.20)
GFR: 57 mL/min — ABNORMAL LOW (ref >60.00)
GLUCOSE: 114 mg/dL — AB (ref 70–99)
Potassium: 4.1 mEq/L (ref 3.5–5.1)
SODIUM: 141 meq/L (ref 135–145)

## 2016-03-22 LAB — HEMOGLOBIN A1C: Hgb A1c MFr Bld: 6 % (ref 4.6–6.5)

## 2016-03-22 LAB — CBC WITH DIFFERENTIAL/PLATELET
BASOS ABS: 0 10*3/uL (ref 0.0–0.1)
BASOS PCT: 0.3 % (ref 0.0–3.0)
EOS ABS: 0.2 10*3/uL (ref 0.0–0.7)
Eosinophils Relative: 2.4 % (ref 0.0–5.0)
HEMATOCRIT: 37.5 % (ref 36.0–46.0)
HEMOGLOBIN: 12.5 g/dL (ref 12.0–15.0)
LYMPHS PCT: 32.6 % (ref 12.0–46.0)
Lymphs Abs: 3.3 10*3/uL (ref 0.7–4.0)
MCHC: 33.4 g/dL (ref 30.0–36.0)
MCV: 84.4 fl (ref 78.0–100.0)
MONO ABS: 0.8 10*3/uL (ref 0.1–1.0)
Monocytes Relative: 8 % (ref 3.0–12.0)
Neutro Abs: 5.7 10*3/uL (ref 1.4–7.7)
Neutrophils Relative %: 56.7 % (ref 43.0–77.0)
Platelets: 463 10*3/uL — ABNORMAL HIGH (ref 150.0–400.0)
RBC: 4.44 Mil/uL (ref 3.87–5.11)
RDW: 14 % (ref 11.5–15.5)
WBC: 10.1 10*3/uL (ref 4.0–10.5)

## 2016-03-22 LAB — HEPATIC FUNCTION PANEL
ALBUMIN: 4 g/dL (ref 3.5–5.2)
ALK PHOS: 79 U/L (ref 39–117)
ALT: 24 U/L (ref 0–35)
AST: 20 U/L (ref 0–37)
BILIRUBIN DIRECT: 0.2 mg/dL (ref 0.0–0.3)
TOTAL PROTEIN: 7.7 g/dL (ref 6.0–8.3)
Total Bilirubin: 0.5 mg/dL (ref 0.2–1.2)

## 2016-03-23 ENCOUNTER — Encounter: Payer: Self-pay | Admitting: Internal Medicine

## 2016-03-25 ENCOUNTER — Encounter: Payer: Self-pay | Admitting: Internal Medicine

## 2016-03-25 ENCOUNTER — Ambulatory Visit (INDEPENDENT_AMBULATORY_CARE_PROVIDER_SITE_OTHER): Payer: BLUE CROSS/BLUE SHIELD | Admitting: Internal Medicine

## 2016-03-25 VITALS — BP 115/80 | HR 68 | Temp 97.7°F | Resp 18 | Ht 65.0 in | Wt 255.5 lb

## 2016-03-25 DIAGNOSIS — F439 Reaction to severe stress, unspecified: Secondary | ICD-10-CM

## 2016-03-25 DIAGNOSIS — R739 Hyperglycemia, unspecified: Secondary | ICD-10-CM

## 2016-03-25 DIAGNOSIS — E7800 Pure hypercholesterolemia, unspecified: Secondary | ICD-10-CM

## 2016-03-25 DIAGNOSIS — R109 Unspecified abdominal pain: Secondary | ICD-10-CM

## 2016-03-25 DIAGNOSIS — K219 Gastro-esophageal reflux disease without esophagitis: Secondary | ICD-10-CM

## 2016-03-25 DIAGNOSIS — Z658 Other specified problems related to psychosocial circumstances: Secondary | ICD-10-CM

## 2016-03-25 DIAGNOSIS — E669 Obesity, unspecified: Secondary | ICD-10-CM

## 2016-03-25 DIAGNOSIS — I10 Essential (primary) hypertension: Secondary | ICD-10-CM

## 2016-03-25 DIAGNOSIS — D473 Essential (hemorrhagic) thrombocythemia: Secondary | ICD-10-CM

## 2016-03-25 DIAGNOSIS — D75839 Thrombocytosis, unspecified: Secondary | ICD-10-CM

## 2016-03-25 DIAGNOSIS — Z79899 Other long term (current) drug therapy: Secondary | ICD-10-CM

## 2016-03-25 MED ORDER — NYSTATIN 100000 UNIT/GM EX CREA: 1.0000 "application " | TOPICAL_CREAM | Freq: Two times a day (BID) | CUTANEOUS | 0 refills | Status: DC

## 2016-03-25 NOTE — Progress Notes (Signed)
Pre-visit discussion using our clinic review tool. No additional management support is needed unless otherwise documented below in the visit note.  

## 2016-03-25 NOTE — Progress Notes (Signed)
Patient ID: Nicole Sims, female   DOB: 29-Apr-1951, 65 y.o.   MRN: [id number]   Subjective:    Patient ID: Nicole Sims, female    DOB: 1950/08/17, 65 y.o.   MRN: [id number]  HPI  Patient here for a scheduled follow up.  Seeing Dr Sharlet Salina.  S/p injection.  On meloxicam.  Discussed possible side effects and risk of medication.  Will follow kidney function.  Some increased nausea.  No vomiting.   Does report has noticed an electric currently that will occasionally shoot through her right upper quadrant.  She did not keep her f/u with GI.  She reports was supposed to f/u for colonoscopy.  No chest pain.  Breathing overall stable.     Past Medical History:  Diagnosis Date  . Anemia   . Asthma   . Chronic headaches   . Depression   . Diverticulosis   . Environmental allergies   . GERD (gastroesophageal reflux disease)   . Hypercholesterolemia   . Hyperglycemia   . Hypertension   . Osteoarthritis   . Palpitations   . Thrombocytosis ([place])   . Urinary incontinence    mixed   Past Surgical History:  Procedure Laterality Date  . BREAST CYST ASPIRATION Bilateral 2005   approximate year  . CERVICAL CONE BIOPSY     CIS  . CHOLECYSTECTOMY    . KNEE ARTHROSCOPY  08/13/08  . knee replacement and revision     left  . ROTATOR CUFF REPAIR     bilateral  . SHOULDER SURGERY  11/17/05  . TONSILLECTOMY  1962  . VAGINAL HYSTERECTOMY  1974   abnormal pap and carcinoma in situ   Family History  Problem Relation Age of Onset  . Diabetes Mellitus II Father   . Thyroid disease Father   . Breast cancer Maternal Aunt   . Colon cancer Neg Hx    Social History   Social History  . Marital status: Married    Spouse name: N/A  . Number of children: N/A  . Years of education: N/A   Social History Main Topics  . Smoking status: Former Smoker    Quit date: 08/17/1983  . Smokeless tobacco: Never Used  . Alcohol use 0.0 oz/week     Comment: rarely  . Drug use: No  . Sexual activity: Not Asked     Other Topics Concern  . None   Social History Narrative  . None    Outpatient Encounter Prescriptions as of 03/25/2016  Medication Sig  . albuterol (PROVENTIL HFA;VENTOLIN HFA) 108 (90 BASE) MCG/ACT inhaler Inhale 2 puffs into the lungs every 4 (four) hours as needed.  Marland Kitchen albuterol (PROVENTIL HFA;VENTOLIN HFA) 108 (90 Base) MCG/ACT inhaler Inhale 2 puffs into the lungs every 6 (six) hours as needed for wheezing or shortness of breath.  Marland Kitchen aspirin EC 81 MG tablet Take 81 mg by mouth daily.  . cetirizine (ZYRTEC) 10 MG tablet Take 10 mg by mouth daily.  Marland Kitchen esomeprazole (NEXIUM) 40 MG capsule TAKE 1 CAPSULE BY MOUTH TWICE DAILY  . fluticasone (FLONASE) 50 MCG/ACT nasal spray USE 2 SPRAYS IN EACH NOSTRIL EVERY DAY  . Fluticasone-Salmeterol (ADVAIR DISKUS) 250-50 MCG/DOSE AEPB Inhale 1 puff into the lungs 2 (two) times daily.  Marland Kitchen gabapentin (NEURONTIN) 100 MG capsule Take 100 mg by mouth 3 (three) times daily.  . meloxicam (MOBIC) 15 MG tablet Take 15 mg by mouth daily.  . metoprolol succinate (TOPROL-XL) 50 MG 24 hr tablet TAKE 1 TABLET  BY MOUTH EVERY DAY WITH OR IMMEDIATELY FOLLOWING A MEAL.  . Multiple Vitamin (MULTIVITAMIN) tablet Take 1 tablet by mouth daily.  Marland Kitchen nystatin (MYCOSTATIN) 100000 UNIT/ML suspension Take 5 mLs (500,000 Units total) by mouth 3 (three) times daily. Swish and spit tid  . ondansetron (ZOFRAN) 4 MG tablet Take 1 tablet (4 mg total) by mouth 2 (two) times daily as needed for nausea or vomiting.  . Probiotic Product (PROBIOTIC DAILY PO) Take by mouth.  . rosuvastatin (CRESTOR) 5 MG tablet TAKE 1 TABLET BY MOUTH EVERY DAY  . venlafaxine XR (EFFEXOR-XR) 150 MG 24 hr capsule TAKE 1 CAPSULE BY MOUTH EVERY DAY WITH BREAKFAST  . VITAMIN E PO Take 1 tablet by mouth daily.  Marland Kitchen nystatin cream (MYCOSTATIN) Apply 1 application topically 2 (two) times daily.  . [DISCONTINUED] Biotin 1000 MCG tablet Take 1,000 mcg by mouth daily.  . [DISCONTINUED] calcium gluconate 500 MG tablet Take  500 mg by mouth 2 (two) times daily.  . [DISCONTINUED] cyclobenzaprine (FLEXERIL) 10 MG tablet Take 10 mg by mouth at bedtime as needed.   . [DISCONTINUED] doxycycline (VIBRA-TABS) 100 MG tablet Take 1 tablet (100 mg total) by mouth 2 (two) times daily. (Patient not taking: Reported on 03/25/2016)  . [DISCONTINUED] naproxen sodium (ANAPROX) 220 MG tablet Take 220 mg by mouth 2 (two) times daily with a meal.  . [DISCONTINUED] oxyCODONE (OXY IR/ROXICODONE) 5 MG immediate release tablet Take 5 mg by mouth 2 (two) times daily as needed.  . [DISCONTINUED] predniSONE (DELTASONE) 10 MG tablet Take 6 tablets x 1 day and then decrease by 1/2 tablet per day until down to zero mg (Patient not taking: Reported on 03/25/2016)  . [DISCONTINUED] SUMAtriptan (IMITREX) 100 MG tablet Take 100 mg by mouth every 2 (two) hours as needed for migraine. May repeat in 2 hours if headache persists or recurs.  . [DISCONTINUED] topiramate (TOPAMAX) 50 MG tablet Take 1 tablet by mouth 2 (two) times daily.   No facility-administered encounter medications on file as of 03/25/2016.     Review of Systems  Constitutional: Negative for appetite change and unexpected weight change.  HENT: Negative for congestion and sinus pressure.   Respiratory: Negative for cough, chest tightness and shortness of breath.   Cardiovascular: Negative for chest pain, palpitations and leg swelling.  Gastrointestinal: Positive for nausea. Negative for vomiting.  Genitourinary: Negative for difficulty urinating and dysuria.  Musculoskeletal: Positive for back pain. Negative for myalgias.  Skin: Negative for color change and rash.  Neurological: Negative for dizziness and light-headedness.  Psychiatric/Behavioral: Negative for agitation and dysphoric mood.       Increased stress.        Objective:     Blood pressure rechecked by me:  118-120/80  Physical Exam  Constitutional: She appears well-developed and well-nourished. No distress.  HENT:    Nose: Nose normal.  Mouth/Throat: Oropharynx is clear and moist.  Neck: Neck supple. No thyromegaly present.  Cardiovascular: Normal rate and regular rhythm.   Pulmonary/Chest: Breath sounds normal. No respiratory distress. She has no wheezes.  Abdominal: Soft. Bowel sounds are normal. There is no tenderness.  Musculoskeletal: She exhibits no edema or tenderness.  Lymphadenopathy:    She has no cervical adenopathy.  Skin: No rash noted. No erythema.  Psychiatric: She has a normal mood and affect. Her behavior is normal.    BP 115/80   Pulse 68   Temp 97.7 F (36.5 C) (Oral)   Resp 18   Ht 5' 5" (1.651  m)   Wt 255 lb 8 oz (115.9 kg)   SpO2 95%   BMI 42.52 kg/m  Wt Readings from Last 3 Encounters:  03/25/16 255 lb 8 oz (115.9 kg)  01/08/16 255 lb 3.2 oz (115.8 kg)  10/20/15 253 lb (114.8 kg)     Lab Results  Component Value Date   WBC 10.1 03/22/2016   HGB 12.5 03/22/2016   HCT 37.5 03/22/2016   PLT 463.0 (H) 03/22/2016   GLUCOSE 114 (H) 03/22/2016   CHOL 199 03/22/2016   TRIG 135.0 03/22/2016   HDL 51.20 03/22/2016   LDLDIRECT 172.8 09/17/2013   LDLCALC 121 (H) 03/22/2016   ALT 24 03/22/2016   AST 20 03/22/2016   NA 141 03/22/2016   K 4.1 03/22/2016   CL 106 03/22/2016   CREATININE 1.03 03/22/2016   BUN 16 03/22/2016   CO2 26 03/22/2016   TSH 1.88 04/28/2015   INR 0.9 07/02/2013   HGBA1C 6.0 03/22/2016   MICROALBUR 1.1 04/28/2015    Mr Cervical Spine Wo Contrast  Result Date: 12/04/2015 CLINICAL DATA:  Chronic neck pain. Stiffness. Hand numbness and weakness. Dizziness. EXAM: MRI CERVICAL SPINE WITHOUT CONTRAST TECHNIQUE: Multiplanar, multisequence MR imaging of the cervical spine was performed. No intravenous contrast was administered. COMPARISON:  MRI cervical spine 08/02/2011. FINDINGS: Alignment: Anatomic except for 2 mm anterolisthesis C4-5 which is facet mediated. Vertebrae: Normal. Cord: Mild stenosis with slight flattening C4-C5. No abnormal cord  signal. Posterior Fossa: Normal. Vertebral Arteries: BILATERAL patent.  RIGHT slightly smaller. Paraspinal tissues: Unremarkable. Disc levels: The individual disc spaces were examined as follows: C2-3:  Normal. C3-4: Central protrusion. Mild facet arthropathy. Slight effacement anterior subarachnoid space. No cord flattening. LEFT-sided foraminal narrowing due to uncinate spurring could affect the LEFT C4 nerve root. C4-5: Severe RIGHT and mild LEFT facet arthropathy. 2 mm anterolisthesis with annular bulging. BILATERAL foraminal narrowing could affect either C5 nerve root, worse on the RIGHT. C5-6:  Annular bulging.  No impingement. C6-7:  Annular bulging.  No impingement. C7-T1:  Annular bulging.  No impingement. Compared with priors, mild progression of disease at C3-4 and C4-5 since 2012. IMPRESSION: Multilevel spondylosis as described. Potentially symptomatic neural impingement at C3-4 LEFT greater than RIGHT, and C4-5 RIGHT greater than LEFT. Electronically Signed   By: Staci Righter M.D.   On: 12/04/2015 17:00       Assessment & Plan:   Problem List Items Addressed This Visit    Abdominal pain in female    Had recent CT.  Reviewed.  Saw GI.  See note.  She did not keep her f/u appt.  Given the persistent issues and nausea, will refer back to GI to complete the w/up.  Continue nexium.  Increased to bid and see if nausea improves.        Relevant Orders   Ambulatory referral to Gastroenterology   GERD (gastroesophageal reflux disease)    On nexium.  With the nausea, will increase to bid and see if any improvement.  Discussed side effects of meloxicam.  Refer back to GI.       Hypercholesterolemia    On crestor.  Low cholesterol diet and exercise.  Follow lipid panel and liver function tests.   Lab Results  Component Value Date   CHOL 199 03/22/2016   HDL 51.20 03/22/2016   LDLCALC 121 (H) 03/22/2016   LDLDIRECT 172.8 09/17/2013   TRIG 135.0 03/22/2016   CHOLHDL 4 03/22/2016         Hyperglycemia  Low carb diet and exercise.  Follow met b and a1c.       Hypertension    Blood pressure as outlined.  Same medication regimen.  Follow pressures.  Follow metabolic panel.        Obesity    Diet and exercise.  Follow.       Stress    Increased stress.  Discussed with her today.  Follow.       Thrombocytosis ([facility])    Platelet count overall relatively stable.  Still elevated.  Follow.         Other Visit Diagnoses    High risk medication use    -  Primary   Relevant Orders   Basic metabolic panel       Einar Pheasant, MD

## 2016-03-26 ENCOUNTER — Encounter: Payer: Self-pay | Admitting: Internal Medicine

## 2016-03-26 NOTE — Assessment & Plan Note (Signed)
On nexium.  With the nausea, will increase to bid and see if any improvement.  Discussed side effects of meloxicam.  Refer back to GI.

## 2016-03-26 NOTE — Assessment & Plan Note (Signed)
Low carb diet and exercise.  Follow met b and a1c.  

## 2016-03-26 NOTE — Assessment & Plan Note (Signed)
Blood pressure as outlined.  Same medication regimen.  Follow pressures.  Follow metabolic panel.  

## 2016-03-26 NOTE — Assessment & Plan Note (Signed)
On crestor.  Low cholesterol diet and exercise.  Follow lipid panel and liver function tests.   Lab Results  Component Value Date   CHOL 199 03/22/2016   HDL 51.20 03/22/2016   LDLCALC 121 (H) 03/22/2016   LDLDIRECT 172.8 09/17/2013   TRIG 135.0 03/22/2016   CHOLHDL 4 03/22/2016

## 2016-03-26 NOTE — Assessment & Plan Note (Signed)
Increased stress.  Discussed with her today.  Follow.   

## 2016-03-26 NOTE — Assessment & Plan Note (Signed)
Had recent CT.  Reviewed.  Saw GI.  See note.  She did not keep her f/u appt.  Given the persistent issues and nausea, will refer back to GI to complete the w/up.  Continue nexium.  Increased to bid and see if nausea improves.

## 2016-03-26 NOTE — Assessment & Plan Note (Signed)
Diet and exercise.  Follow.  

## 2016-03-26 NOTE — Assessment & Plan Note (Signed)
Platelet count overall relatively stable.  Still elevated.  Follow.

## 2016-03-29 ENCOUNTER — Encounter: Payer: Self-pay | Admitting: Internal Medicine

## 2016-03-29 DIAGNOSIS — K6289 Other specified diseases of anus and rectum: Secondary | ICD-10-CM

## 2016-03-30 NOTE — Telephone Encounter (Signed)
Order placed for gyn referral.  

## 2016-04-13 ENCOUNTER — Other Ambulatory Visit: Payer: Self-pay | Admitting: Internal Medicine

## 2016-04-13 NOTE — Telephone Encounter (Signed)
Last approved in September 2016 for #30 with 2 refills. Should this be refilled again? Crestor was not mentioned on last labs results.

## 2016-04-14 NOTE — Telephone Encounter (Signed)
Need to confirm with pt if she is taking crestor.  If so, ok to refill.

## 2016-04-15 ENCOUNTER — Other Ambulatory Visit (INDEPENDENT_AMBULATORY_CARE_PROVIDER_SITE_OTHER): Payer: BLUE CROSS/BLUE SHIELD

## 2016-04-15 ENCOUNTER — Encounter: Payer: Self-pay | Admitting: *Deleted

## 2016-04-15 DIAGNOSIS — Z79899 Other long term (current) drug therapy: Secondary | ICD-10-CM

## 2016-04-15 LAB — BASIC METABOLIC PANEL
BUN: 15 mg/dL (ref 6–23)
CALCIUM: 9.2 mg/dL (ref 8.4–10.5)
CO2: 27 mEq/L (ref 19–32)
CREATININE: 1.03 mg/dL (ref 0.40–1.20)
Chloride: 105 mEq/L (ref 96–112)
GFR: 56.98 mL/min — AB (ref >60.00)
Glucose, Bld: 95 mg/dL (ref 70–99)
Potassium: 4 mEq/L (ref 3.5–5.1)
Sodium: 139 mEq/L (ref 135–145)

## 2016-04-15 NOTE — Telephone Encounter (Signed)
LMTCB & sent mychart message 

## 2016-04-16 ENCOUNTER — Other Ambulatory Visit: Payer: Self-pay | Admitting: Internal Medicine

## 2016-04-16 ENCOUNTER — Encounter: Payer: Self-pay | Admitting: Internal Medicine

## 2016-04-21 MED ORDER — ONDANSETRON HCL 4 MG PO TABS: 4.0000 mg | ORAL_TABLET | Freq: Two times a day (BID) | ORAL | 0 refills | Status: DC | PRN

## 2016-04-21 NOTE — Telephone Encounter (Signed)
ok'd refill on zofran #20 with no refills.

## 2016-05-10 ENCOUNTER — Other Ambulatory Visit: Payer: Self-pay | Admitting: Internal Medicine

## 2016-05-17 ENCOUNTER — Other Ambulatory Visit: Payer: Self-pay | Admitting: Internal Medicine

## 2016-06-07 ENCOUNTER — Other Ambulatory Visit: Payer: Self-pay | Admitting: Internal Medicine

## 2016-06-07 ENCOUNTER — Ambulatory Visit (INDEPENDENT_AMBULATORY_CARE_PROVIDER_SITE_OTHER): Payer: BLUE CROSS/BLUE SHIELD | Admitting: Internal Medicine

## 2016-06-07 ENCOUNTER — Encounter: Payer: Self-pay | Admitting: Internal Medicine

## 2016-06-07 VITALS — BP 142/80 | HR 74 | Temp 97.5°F | Resp 20 | Wt 252.8 lb

## 2016-06-07 DIAGNOSIS — Z1239 Encounter for other screening for malignant neoplasm of breast: Secondary | ICD-10-CM

## 2016-06-07 DIAGNOSIS — Z1231 Encounter for screening mammogram for malignant neoplasm of breast: Secondary | ICD-10-CM | POA: Diagnosis not present

## 2016-06-07 DIAGNOSIS — N898 Other specified noninflammatory disorders of vagina: Secondary | ICD-10-CM

## 2016-06-07 DIAGNOSIS — R05 Cough: Secondary | ICD-10-CM

## 2016-06-07 DIAGNOSIS — R739 Hyperglycemia, unspecified: Secondary | ICD-10-CM

## 2016-06-07 DIAGNOSIS — R059 Cough, unspecified: Secondary | ICD-10-CM

## 2016-06-07 DIAGNOSIS — E2839 Other primary ovarian failure: Secondary | ICD-10-CM | POA: Diagnosis not present

## 2016-06-07 DIAGNOSIS — H538 Other visual disturbances: Secondary | ICD-10-CM | POA: Insufficient documentation

## 2016-06-07 DIAGNOSIS — E7800 Pure hypercholesterolemia, unspecified: Secondary | ICD-10-CM

## 2016-06-07 DIAGNOSIS — D473 Essential (hemorrhagic) thrombocythemia: Secondary | ICD-10-CM

## 2016-06-07 DIAGNOSIS — K219 Gastro-esophageal reflux disease without esophagitis: Secondary | ICD-10-CM

## 2016-06-07 DIAGNOSIS — D75839 Thrombocytosis, unspecified: Secondary | ICD-10-CM

## 2016-06-07 DIAGNOSIS — I10 Essential (primary) hypertension: Secondary | ICD-10-CM

## 2016-06-07 MED ORDER — PREDNISONE 10 MG PO TABS: ORAL_TABLET | ORAL | 0 refills | Status: DC

## 2016-06-07 NOTE — Patient Instructions (Signed)
Saline nasal spray - flush nose at least 2-3x/week  nasacort nasal spray - 2 sprays each nostril one time per day.    Robitussin twice a day as needed.    Take the prednisone as directed.    Start the advair twice a day

## 2016-06-07 NOTE — Progress Notes (Signed)
Pre visit review using our clinic review tool, if applicable. No additional management support is needed unless otherwise documented below in the visit note. 

## 2016-06-07 NOTE — Progress Notes (Addendum)
Patient ID: Nicole Sims, female   DOB: 26-Aug-1950, 65 y.o.   MRN: [id number]   Subjective:    Patient ID: Nicole Sims, female    DOB: 08-29-1950, 65 y.o.   MRN: [id number]  HPI  Patient here for a scheduled follow up.  She reports that starting last week, she developed increased cough and nasal congestion.  Some increased sinus congestion.  Increased cough - some productive cough.  Some chest congestion.  Questionable wheezing.  Just started mucinex.  Not using inhalers regularly.  Just saw GI for persistent acid reflux.  Was changed to aciphex.  This is helping her acid reflux.  Is planning for EGD and colonoscopy 12//4/17.  No abdominal pain.  She does report persistent vaginal irritation despite using topical creams.  Discussed referral to gyn.  She is in agreement.  She also reports having an episode of blurred vision.  She feels was blurred in her left eye.  No actual vision loss.  No pain in the eye.     Past Medical History:  Diagnosis Date  . Anemia   . Asthma   . Chronic headaches   . Depression   . Diverticulosis   . Environmental allergies   . GERD (gastroesophageal reflux disease)   . Hypercholesterolemia   . Hyperglycemia   . Hypertension   . Osteoarthritis   . Palpitations   . Thrombocytosis ([place])   . Urinary incontinence    mixed   Past Surgical History:  Procedure Laterality Date  . BREAST CYST ASPIRATION Bilateral 2005   approximate year  . CERVICAL CONE BIOPSY     CIS  . CHOLECYSTECTOMY    . KNEE ARTHROSCOPY  08/13/08  . knee replacement and revision     left  . ROTATOR CUFF REPAIR     bilateral  . SHOULDER SURGERY  11/17/05  . TONSILLECTOMY  1962  . VAGINAL HYSTERECTOMY  1974   abnormal pap and carcinoma in situ   Family History  Problem Relation Age of Onset  . Diabetes Mellitus II Father   . Thyroid disease Father   . Breast cancer Maternal Aunt   . Colon cancer Neg Hx    Social History   Social History  . Marital status: Married    Spouse  name: N/A  . Number of children: N/A  . Years of education: N/A   Social History Main Topics  . Smoking status: Former Smoker    Quit date: 08/17/1983  . Smokeless tobacco: Never Used  . Alcohol use 0.0 oz/week     Comment: rarely  . Drug use: No  . Sexual activity: Not Asked   Other Topics Concern  . None   Social History Narrative  . None    Outpatient Encounter Prescriptions as of 06/07/2016  Medication Sig  . albuterol (PROVENTIL HFA;VENTOLIN HFA) 108 (90 BASE) MCG/ACT inhaler Inhale 2 puffs into the lungs every 4 (four) hours as needed.  Marland Kitchen albuterol (PROVENTIL HFA;VENTOLIN HFA) 108 (90 Base) MCG/ACT inhaler Inhale 2 puffs into the lungs every 6 (six) hours as needed for wheezing or shortness of breath.  Marland Kitchen aspirin EC 81 MG tablet Take 81 mg by mouth daily.  . cetirizine (ZYRTEC) 10 MG tablet Take 10 mg by mouth daily.  . fluticasone (FLONASE) 50 MCG/ACT nasal spray USE 2 SPRAYS IN EACH NOSTRIL EVERY DAY  . Fluticasone-Salmeterol (ADVAIR DISKUS) 250-50 MCG/DOSE AEPB Inhale 1 puff into the lungs 2 (two) times daily.  . meloxicam (MOBIC) 15 MG tablet  Take 15 mg by mouth daily.  . metoprolol succinate (TOPROL-XL) 50 MG 24 hr tablet TAKE 1 TABLET BY MOUTH EVERY DAY WITH OR IMMEDIATELY FOLLOWING A MEAL.  Marland Kitchen ondansetron (ZOFRAN) 4 MG tablet Take 1 tablet (4 mg total) by mouth 2 (two) times daily as needed for nausea or vomiting.  . Probiotic Product (PROBIOTIC DAILY PO) Take by mouth.  . RABEprazole (ACIPHEX) 20 MG tablet Take 20 mg by mouth 2 (two) times daily.  Marland Kitchen venlafaxine XR (EFFEXOR-XR) 150 MG 24 hr capsule TAKE 1 CAPSULE BY MOUTH EVERY DAY WITH BREAKFAST  . [DISCONTINUED] rosuvastatin (CRESTOR) 5 MG tablet TAKE 1 TABLET BY MOUTH EVERY DAY  . gabapentin (NEURONTIN) 100 MG capsule Take 100 mg by mouth 3 (three) times daily.  . Multiple Vitamin (MULTIVITAMIN) tablet Take 1 tablet by mouth daily.  . predniSONE (DELTASONE) 10 MG tablet Take 4 tablets x 1 day and then decrease by 1/2  tablet per day until down to zero mg.  Marland Kitchen VITAMIN E PO Take 1 tablet by mouth daily.  . [DISCONTINUED] esomeprazole (NEXIUM) 40 MG capsule TAKE 1 CAPSULE BY MOUTH TWICE DAILY (Patient not taking: Reported on 06/07/2016)  . [DISCONTINUED] nystatin (MYCOSTATIN) 100000 UNIT/ML suspension Take 5 mLs (500,000 Units total) by mouth 3 (three) times daily. Swish and spit tid (Patient not taking: Reported on 06/07/2016)  . [DISCONTINUED] nystatin cream (MYCOSTATIN) Apply 1 application topically 2 (two) times daily. (Patient not taking: Reported on 06/07/2016)  . [DISCONTINUED] rosuvastatin (CRESTOR) 5 MG tablet TAKE 1 TABLET BY MOUTH EVERY DAY (Patient not taking: Reported on 06/07/2016)   No facility-administered encounter medications on file as of 06/07/2016.     Review of Systems  Constitutional: Negative for appetite change and unexpected weight change.  HENT: Positive for congestion, postnasal drip and sinus pressure.   Respiratory: Positive for cough. Negative for chest tightness and shortness of breath.        Questionable wheezing.   Cardiovascular: Negative for chest pain, palpitations and leg swelling.  Gastrointestinal: Negative for abdominal pain, diarrhea, nausea and vomiting.  Genitourinary: Negative for difficulty urinating and dysuria.  Musculoskeletal: Negative for joint swelling and myalgias.  Skin: Negative for color change and rash.  Neurological: Positive for headaches. Negative for dizziness and light-headedness.       She describes the headaches as sinus headache.   Psychiatric/Behavioral: Negative for agitation and dysphoric mood.       Objective:     Blood pressure rechecked by me:  136/78  Physical Exam  Constitutional: She appears well-developed and well-nourished. No distress.  HENT:  Nose: Nose normal.  Mouth/Throat: Oropharynx is clear and moist.  Neck: Neck supple. No thyromegaly present.  Cardiovascular: Normal rate and regular rhythm.   Pulmonary/Chest:  Breath sounds normal. No respiratory distress. She has no wheezes.  Increased cough with forced expiration.   Abdominal: Soft. Bowel sounds are normal. There is no tenderness.  Musculoskeletal: She exhibits no edema or tenderness.  Lymphadenopathy:    She has no cervical adenopathy.  Skin: No rash noted. No erythema.  Psychiatric: She has a normal mood and affect. Her behavior is normal.    BP (!) 142/80 (BP Location: Left Arm, Patient Position: Sitting, Cuff Size: Normal)   Pulse 74   Temp 97.5 F (36.4 C) (Oral)   Resp 20   Wt 252 lb 12 oz (114.6 kg)   SpO2 98%   BMI 42.06 kg/m  Wt Readings from Last 3 Encounters:  06/07/16 252 lb 12 oz (  114.6 kg)  03/25/16 255 lb 8 oz (115.9 kg)  01/08/16 255 lb 3.2 oz (115.8 kg)     Lab Results  Component Value Date   WBC 10.1 03/22/2016   HGB 12.5 03/22/2016   HCT 37.5 03/22/2016   PLT 463.0 (H) 03/22/2016   GLUCOSE 95 04/15/2016   CHOL 199 03/22/2016   TRIG 135.0 03/22/2016   HDL 51.20 03/22/2016   LDLDIRECT 172.8 09/17/2013   LDLCALC 121 (H) 03/22/2016   ALT 24 03/22/2016   AST 20 03/22/2016   NA 139 04/15/2016   K 4.0 04/15/2016   CL 105 04/15/2016   CREATININE 1.03 04/15/2016   BUN 15 04/15/2016   CO2 27 04/15/2016   TSH 1.88 04/28/2015   INR 0.9 07/02/2013   HGBA1C 6.0 03/22/2016   MICROALBUR 1.1 04/28/2015    Mr Cervical Spine Wo Contrast  Result Date: 12/04/2015 CLINICAL DATA:  Chronic neck pain. Stiffness. Hand numbness and weakness. Dizziness. EXAM: MRI CERVICAL SPINE WITHOUT CONTRAST TECHNIQUE: Multiplanar, multisequence MR imaging of the cervical spine was performed. No intravenous contrast was administered. COMPARISON:  MRI cervical spine 08/02/2011. FINDINGS: Alignment: Anatomic except for 2 mm anterolisthesis C4-5 which is facet mediated. Vertebrae: Normal. Cord: Mild stenosis with slight flattening C4-C5. No abnormal cord signal. Posterior Fossa: Normal. Vertebral Arteries: BILATERAL patent.  RIGHT slightly  smaller. Paraspinal tissues: Unremarkable. Disc levels: The individual disc spaces were examined as follows: C2-3:  Normal. C3-4: Central protrusion. Mild facet arthropathy. Slight effacement anterior subarachnoid space. No cord flattening. LEFT-sided foraminal narrowing due to uncinate spurring could affect the LEFT C4 nerve root. C4-5: Severe RIGHT and mild LEFT facet arthropathy. 2 mm anterolisthesis with annular bulging. BILATERAL foraminal narrowing could affect either C5 nerve root, worse on the RIGHT. C5-6:  Annular bulging.  No impingement. C6-7:  Annular bulging.  No impingement. C7-T1:  Annular bulging.  No impingement. Compared with priors, mild progression of disease at C3-4 and C4-5 since 2012. IMPRESSION: Multilevel spondylosis as described. Potentially symptomatic neural impingement at C3-4 LEFT greater than RIGHT, and C4-5 RIGHT greater than LEFT. Electronically Signed   By: Staci Righter M.D.   On: 12/04/2015 17:00       Assessment & Plan:   Problem List Items Addressed This Visit    Blurred vision - Primary    Blurred vision as outlined.  Resolved and has not reoccurred.  Have opthalmology evaluate.  Take aspirin daily.  Has not been taking regularly.  Check carotid ultrasound.        Relevant Orders   Ambulatory referral to Ophthalmology   Ambulatory referral to Vascular Surgery   Cough    Cough and congestion as outlined.  Saline nasal spray and nasacort nasal spray as directed.  Robitussin as directed.  Prednisone taper as directed.  Hold on abx.  Restart inhalers as directed.  Follow.        GERD (gastroesophageal reflux disease)    Saw GI.  Changed to aciphex.  Planning for EGD in 07/2016.  Better.  Follow.       Relevant Medications   RABEprazole (ACIPHEX) 20 MG tablet   Hypercholesterolemia    Has been on crestor.  Low cholesterol diet and exercise.  Follow lipid panel.        Hyperglycemia    Low carb diet and exercise.  Follow met b and a1c.        Hypertension    Blood pressure on recheck improved.  Continue same medication regimen.  Follow pressures.  Follow metabolic panel.       Severe obesity (BMI >= 40) (HCC)    Discussed diet and exercise.  Follow.       Thrombocytosis (HCC)    Platelet count slightly elevated.  Follow cbc.       Vaginal irritation    Persistent vaginal irritation despite topical creams.  Will refer to gyn for evaluation.        Relevant Orders   Ambulatory referral to Gynecology    Other Visit Diagnoses    Encounter for breast cancer screening other than mammogram       Breast cancer screening       Relevant Orders   MM DIGITAL SCREENING BILATERAL   Estrogen deficiency       Relevant Orders   DG Bone Density     I spent 40 minutes with the patient and more than 50% of the time was spent in consultation regarding the above.  Time spent discussing acute issues and discussing w/up and treatment plan.     Einar Pheasant, MD

## 2016-06-08 ENCOUNTER — Encounter: Payer: Self-pay | Admitting: Internal Medicine

## 2016-06-08 NOTE — Assessment & Plan Note (Signed)
Low carb diet and exercise.  Follow met b and a1c.  

## 2016-06-08 NOTE — Assessment & Plan Note (Signed)
Persistent vaginal irritation despite topical creams.  Will refer to gyn for evaluation.

## 2016-06-08 NOTE — Assessment & Plan Note (Signed)
Discussed diet and exercise.  Follow.  

## 2016-06-08 NOTE — Assessment & Plan Note (Signed)
Blurred vision as outlined.  Resolved and has not reoccurred.  Have opthalmology evaluate.  Take aspirin daily.  Has not been taking regularly.  Check carotid ultrasound.

## 2016-06-08 NOTE — Assessment & Plan Note (Signed)
Platelet count slightly elevated.  Follow cbc.  

## 2016-06-08 NOTE — Assessment & Plan Note (Signed)
Has been on crestor.  Low cholesterol diet and exercise.  Follow lipid panel.

## 2016-06-08 NOTE — Assessment & Plan Note (Signed)
Blood pressure on recheck improved.  Continue same medication regimen.  Follow pressures.  Follow metabolic panel.   

## 2016-06-08 NOTE — Assessment & Plan Note (Addendum)
Cough and congestion as outlined.  Saline nasal spray and nasacort nasal spray as directed.  Robitussin as directed.  Prednisone taper as directed.  Hold on abx.  Restart inhalers as directed.  Follow.

## 2016-06-08 NOTE — Assessment & Plan Note (Signed)
Saw GI.  Changed to aciphex.  Planning for EGD in 07/2016.  Better.  Follow.

## 2016-06-10 ENCOUNTER — Telehealth: Payer: Self-pay | Admitting: Internal Medicine

## 2016-06-10 NOTE — Telephone Encounter (Signed)
Nicole Sims from [facility] needs an order put in for patient's ultrasound for carotid artery. Pt is scheduled for tomorrow. Please advise, thank you!  Call [phone]

## 2016-06-11 ENCOUNTER — Other Ambulatory Visit: Payer: Self-pay | Admitting: Internal Medicine

## 2016-06-11 ENCOUNTER — Ambulatory Visit (INDEPENDENT_AMBULATORY_CARE_PROVIDER_SITE_OTHER): Payer: BLUE CROSS/BLUE SHIELD

## 2016-06-11 ENCOUNTER — Encounter (INDEPENDENT_AMBULATORY_CARE_PROVIDER_SITE_OTHER): Payer: Self-pay

## 2016-06-11 DIAGNOSIS — H538 Other visual disturbances: Secondary | ICD-10-CM

## 2016-06-11 NOTE — Progress Notes (Signed)
Order placed for carotid ultrasound.   

## 2016-06-11 NOTE — Progress Notes (Unsigned)
Patient states blurred vision in both eyes only a couple of times last week. States the blurred vision lasted about a minute.

## 2016-06-11 NOTE — Telephone Encounter (Signed)
Order placed

## 2016-06-12 ENCOUNTER — Encounter: Payer: Self-pay | Admitting: Internal Medicine

## 2016-06-23 ENCOUNTER — Other Ambulatory Visit: Payer: Self-pay | Admitting: Internal Medicine

## 2016-07-16 ENCOUNTER — Encounter: Payer: Self-pay | Admitting: *Deleted

## 2016-07-19 ENCOUNTER — Ambulatory Visit: Payer: BLUE CROSS/BLUE SHIELD | Admitting: Anesthesiology

## 2016-07-19 ENCOUNTER — Encounter: Payer: Self-pay | Admitting: *Deleted

## 2016-07-19 ENCOUNTER — Ambulatory Visit
Admission: RE | Admit: 2016-07-19 | Discharge: 2016-07-19 | Disposition: A | Discharge status: Home / Self Care | Payer: BLUE CROSS/BLUE SHIELD | Source: Ambulatory Visit | Attending: Unknown Physician Specialty | Admitting: Unknown Physician Specialty

## 2016-07-19 ENCOUNTER — Encounter
Admission: RE | Disposition: A | Discharge status: Home / Self Care | Payer: Self-pay | Source: Ambulatory Visit | Attending: Unknown Physician Specialty

## 2016-07-19 DIAGNOSIS — Z9889 Other specified postprocedural states: Secondary | ICD-10-CM | POA: Insufficient documentation

## 2016-07-19 DIAGNOSIS — Z833 Family history of diabetes mellitus: Secondary | ICD-10-CM | POA: Diagnosis not present

## 2016-07-19 DIAGNOSIS — Z79899 Other long term (current) drug therapy: Secondary | ICD-10-CM | POA: Insufficient documentation

## 2016-07-19 DIAGNOSIS — M1990 Unspecified osteoarthritis, unspecified site: Secondary | ICD-10-CM | POA: Insufficient documentation

## 2016-07-19 DIAGNOSIS — K449 Diaphragmatic hernia without obstruction or gangrene: Secondary | ICD-10-CM | POA: Insufficient documentation

## 2016-07-19 DIAGNOSIS — Z803 Family history of malignant neoplasm of breast: Secondary | ICD-10-CM | POA: Insufficient documentation

## 2016-07-19 DIAGNOSIS — K2950 Unspecified chronic gastritis without bleeding: Secondary | ICD-10-CM | POA: Diagnosis not present

## 2016-07-19 DIAGNOSIS — K210 Gastro-esophageal reflux disease with esophagitis: Secondary | ICD-10-CM | POA: Insufficient documentation

## 2016-07-19 DIAGNOSIS — Z885 Allergy status to narcotic agent status: Secondary | ICD-10-CM | POA: Insufficient documentation

## 2016-07-19 DIAGNOSIS — Z7982 Long term (current) use of aspirin: Secondary | ICD-10-CM | POA: Diagnosis not present

## 2016-07-19 DIAGNOSIS — E7800 Pure hypercholesterolemia, unspecified: Secondary | ICD-10-CM | POA: Diagnosis not present

## 2016-07-19 DIAGNOSIS — K635 Polyp of colon: Secondary | ICD-10-CM | POA: Diagnosis not present

## 2016-07-19 DIAGNOSIS — Z90710 Acquired absence of both cervix and uterus: Secondary | ICD-10-CM | POA: Insufficient documentation

## 2016-07-19 DIAGNOSIS — J45909 Unspecified asthma, uncomplicated: Secondary | ICD-10-CM | POA: Diagnosis not present

## 2016-07-19 DIAGNOSIS — Z7951 Long term (current) use of inhaled steroids: Secondary | ICD-10-CM | POA: Insufficient documentation

## 2016-07-19 DIAGNOSIS — Z888 Allergy status to other drugs, medicaments and biological substances status: Secondary | ICD-10-CM | POA: Diagnosis not present

## 2016-07-19 DIAGNOSIS — I10 Essential (primary) hypertension: Secondary | ICD-10-CM | POA: Diagnosis not present

## 2016-07-19 DIAGNOSIS — Z96652 Presence of left artificial knee joint: Secondary | ICD-10-CM | POA: Diagnosis not present

## 2016-07-19 DIAGNOSIS — K640 First degree hemorrhoids: Secondary | ICD-10-CM | POA: Insufficient documentation

## 2016-07-19 DIAGNOSIS — Z87891 Personal history of nicotine dependence: Secondary | ICD-10-CM | POA: Insufficient documentation

## 2016-07-19 DIAGNOSIS — R1084 Generalized abdominal pain: Secondary | ICD-10-CM | POA: Diagnosis present

## 2016-07-19 DIAGNOSIS — R1013 Epigastric pain: Secondary | ICD-10-CM | POA: Diagnosis present

## 2016-07-19 DIAGNOSIS — Z9109 Other allergy status, other than to drugs and biological substances: Secondary | ICD-10-CM | POA: Diagnosis not present

## 2016-07-19 DIAGNOSIS — Z881 Allergy status to other antibiotic agents status: Secondary | ICD-10-CM | POA: Insufficient documentation

## 2016-07-19 DIAGNOSIS — Z8349 Family history of other endocrine, nutritional and metabolic diseases: Secondary | ICD-10-CM | POA: Diagnosis not present

## 2016-07-19 DIAGNOSIS — R1310 Dysphagia, unspecified: Secondary | ICD-10-CM | POA: Diagnosis not present

## 2016-07-19 HISTORY — DX: Anxiety disorder, unspecified: F41.9

## 2016-07-19 HISTORY — PX: COLONOSCOPY WITH PROPOFOL: SHX5780

## 2016-07-19 HISTORY — PX: ESOPHAGOGASTRODUODENOSCOPY (EGD) WITH PROPOFOL: SHX5813

## 2016-07-19 SURGERY — COLONOSCOPY WITH PROPOFOL
Anesthesia: General

## 2016-07-19 MED ORDER — SODIUM CHLORIDE 0.9 % IV SOLN
INTRAVENOUS | Status: DC
  Administered 2016-07-19: 14:00:00 via INTRAVENOUS

## 2016-07-19 MED ORDER — SODIUM CHLORIDE 0.9 % IV SOLN: INTRAVENOUS | Status: DC

## 2016-07-19 MED ORDER — FENTANYL CITRATE (PF) 100 MCG/2ML IJ SOLN
INTRAMUSCULAR | Status: DC | PRN
  Administered 2016-07-19: 50 ug via INTRAVENOUS

## 2016-07-19 MED ORDER — CLINDAMYCIN PHOSPHATE 300 MG/2ML IJ SOLN
600.0000 mg | Freq: Once | INTRAMUSCULAR | Status: DC
  Filled 2016-07-19: qty 4

## 2016-07-19 MED ORDER — PROPOFOL 500 MG/50ML IV EMUL
INTRAVENOUS | Status: DC | PRN
  Administered 2016-07-19: 175 ug/kg/min via INTRAVENOUS

## 2016-07-19 MED ORDER — MIDAZOLAM HCL 2 MG/2ML IJ SOLN
INTRAMUSCULAR | Status: DC | PRN
  Administered 2016-07-19: 2 mg via INTRAVENOUS

## 2016-07-19 MED ORDER — LIDOCAINE HCL (CARDIAC) 20 MG/ML IV SOLN
INTRAVENOUS | Status: DC | PRN
  Administered 2016-07-19: 100 mg via INTRAVENOUS

## 2016-07-19 MED ORDER — CLINDAMYCIN PHOSPHATE 600 MG/50ML IV SOLN
600.0000 mg | Freq: Once | INTRAVENOUS | Status: AC
  Administered 2016-07-19: 600 mg via INTRAVENOUS
  Filled 2016-07-19: qty 50

## 2016-07-19 MED ORDER — PROPOFOL 10 MG/ML IV BOLUS
INTRAVENOUS | Status: DC | PRN
  Administered 2016-07-19: 40 mg via INTRAVENOUS
  Administered 2016-07-19: 60 mg via INTRAVENOUS

## 2016-07-19 NOTE — Anesthesia Preprocedure Evaluation (Signed)
Anesthesia Evaluation  Patient identified by MRN, date of birth, ID band Patient awake    Reviewed: Allergy & Precautions, NPO status , Patient's Chart, lab work & pertinent test results  History of Anesthesia Complications Negative for: history of anesthetic complications  Airway Mallampati: III       Dental  (+) Partial Upper   Pulmonary asthma , former smoker,           Cardiovascular hypertension, Pt. on medications and Pt. on home beta blockers      Neuro/Psych negative neurological ROS     GI/Hepatic Neg liver ROS, GERD  Medicated and Poorly Controlled,  Endo/Other  negative endocrine ROS  Renal/GU negative Renal ROS     Musculoskeletal   Abdominal   Peds  Hematology  (+) anemia ,   Anesthesia Other Findings   Reproductive/Obstetrics                            Anesthesia Physical Anesthesia Plan  ASA: III  Anesthesia Plan: General   Post-op Pain Management:    Induction: Intravenous  Airway Management Planned: Nasal Cannula  Additional Equipment:   Intra-op Plan:   Post-operative Plan:   Informed Consent: I have reviewed the patients History and Physical, chart, labs and discussed the procedure including the risks, benefits and alternatives for the proposed anesthesia with the patient or authorized representative who has indicated his/her understanding and acceptance.     Plan Discussed with:   Anesthesia Plan Comments:         Anesthesia Quick Evaluation

## 2016-07-19 NOTE — Transfer of Care (Signed)
Immediate Anesthesia Transfer of Care Note  Patient: Nicole Sims  Procedure(s) Performed: Procedure(s): COLONOSCOPY WITH PROPOFOL (N/A) ESOPHAGOGASTRODUODENOSCOPY (EGD) WITH PROPOFOL (N/A)  Patient Location: PACU  Anesthesia Type:General  Level of Consciousness: sedated and responds to stimulation  Airway & Oxygen Therapy: Patient Spontanous Breathing and Patient connected to nasal cannula oxygen  Post-op Assessment: Report given to RN and Post -op Vital signs reviewed and stable  Post vital signs: Reviewed and stable  Last Vitals:  Vitals:   07/19/16 1321 07/19/16 1429  BP: 127/63 101/69  Pulse: 86 95  Resp: 18 18  Temp: 36.3 C     Last Pain:  Vitals:   07/19/16 1321  TempSrc: Tympanic  PainSc: 5          Complications: No apparent anesthesia complications

## 2016-07-19 NOTE — Op Note (Signed)
[facility] Gastroenterology Patient Name: Nicole Sims Procedure Date: 07/19/2016 1:50 PM MRN: [id number] Account #: [id number] Date of Birth: [birth year] Admit Type: Outpatient Age: 65 Room: [facility] ENDO ROOM 4 Gender: Female Note Status: Finalized Procedure:            Upper GI endoscopy Indications:          Heartburn, Suspected gastro-esophageal reflux disease Providers:            Manya Silvas, MD Referring MD:         Einar Pheasant, MD (Referring MD) Medicines:            Propofol per Anesthesia Complications:        No immediate complications. Procedure:            Pre-Anesthesia Assessment:                       - After reviewing the risks and benefits, the patient                        was deemed in satisfactory condition to undergo the                        procedure.                       After obtaining informed consent, the endoscope was                        passed under direct vision. Throughout the procedure,                        the patient's blood pressure, pulse, and oxygen                        saturations were monitored continuously. The Endoscope                        was introduced through the mouth, and advanced to the                        second part of duodenum. The upper GI endoscopy was                        accomplished without difficulty. The patient tolerated                        the procedure well. Findings:      LA Grade A (one or more mucosal breaks less than 5 mm, not extending       between tops of 2 mucosal folds) esophagitis with no bleeding was found       39 cm from the incisors. Biopsies were taken with a cold forceps for       histology.      Localized moderate inflammation characterized by erosions, erythema and       granularity was found on the posterior [place] of the gastric antrum.       Biopsies were taken with a cold forceps for histology. Biopsies were       taken with a cold forceps for Helicobacter  pylori testing.      Diffuse nodular mucosa was found in  the gastric body. Biopsies were       taken with a cold forceps for histology. Biopsies were taken with a cold       forceps for Helicobacter pylori testing.      The examined duodenum was normal. Impression:           - LA Grade A reflux esophagitis. Biopsied.                       - Gastritis. Biopsied.                       - Nodular mucosa in the gastric body. Biopsied.                       - Normal examined duodenum. Recommendation:       - Await pathology results. Medicine like Prilosec and                        carafate slurry. Manya Silvas, MD 07/19/2016 2:10:11 PM This report has been signed electronically. Number of Addenda: 0 Note Initiated On: 07/19/2016 1:50 PM      [facility]

## 2016-07-19 NOTE — Anesthesia Procedure Notes (Signed)
Performed by: Keeya Dyckman Pre-anesthesia Checklist: Patient identified, Emergency Drugs available, Suction available, Patient being monitored and Timeout performed Patient Re-evaluated:Patient Re-evaluated prior to inductionOxygen Delivery Method: Nasal cannula Preoxygenation: Pre-oxygenation with 100% oxygen Intubation Type: IV induction       

## 2016-07-19 NOTE — Anesthesia Postprocedure Evaluation (Signed)
Anesthesia Post Note  Patient: Nicole Sims  Procedure(s) Performed: Procedure(s) (LRB): COLONOSCOPY WITH PROPOFOL (N/A) ESOPHAGOGASTRODUODENOSCOPY (EGD) WITH PROPOFOL (N/A)  Patient location during evaluation: Endoscopy Anesthesia Type: General Level of consciousness: awake and alert Pain management: pain level controlled Vital Signs Assessment: post-procedure vital signs reviewed and stable Respiratory status: spontaneous breathing and respiratory function stable Cardiovascular status: stable Anesthetic complications: no    Last Vitals:  Vitals:   07/19/16 1438 07/19/16 1448  BP: 116/70 (!) 159/87  Pulse: 90 93  Resp: 17 (!) 23  Temp:      Last Pain:  Vitals:   07/19/16 1428  TempSrc: Tympanic  PainSc:                  Yelitza Reach K

## 2016-07-19 NOTE — Op Note (Signed)
[facility] Gastroenterology Patient Name: Nicole Sims Procedure Date: 07/19/2016 1:50 PM MRN: [id number] Account #: [id number] Date of Birth: January 18, 1951 Admit Type: Outpatient Age: 65 Room: [facility] ENDO ROOM 4 Gender: Female Note Status: Finalized Procedure:            Colonoscopy Indications:          Generalized abdominal pain Providers:            Manya Silvas, MD Referring MD:         Einar Pheasant, MD (Referring MD) Medicines:            Propofol per Anesthesia Complications:        No immediate complications. Procedure:            Pre-Anesthesia Assessment:                       - After reviewing the risks and benefits, the patient                        was deemed in satisfactory condition to undergo the                        procedure.                       After obtaining informed consent, the colonoscope was                        passed under direct vision. Throughout the procedure,                        the patient's blood pressure, pulse, and oxygen                        saturations were monitored continuously. The Olympus                        PCF-H180AL colonoscope ( S#: [id number] ) was introduced                        through the anus and advanced to the the cecum,                        identified by appendiceal orifice and ileocecal valve.                        The colonoscopy was performed without difficulty. The                        patient tolerated the procedure well. The quality of                        the bowel preparation was good. Findings:      A diminutive polyp was found in the descending colon. The polyp was       sessile. The polyp was removed with a jumbo cold forceps. Resection and       retrieval were complete.      Internal hemorrhoids were found during endoscopy. The hemorrhoids were       small and Grade I (internal hemorrhoids that do not prolapse).  The exam was otherwise without abnormality. Impression:            - One diminutive polyp in the descending colon, removed                        with a jumbo cold forceps. Resected and retrieved.                       - Internal hemorrhoids.                       - The examination was otherwise normal. Recommendation:       - Await pathology results.                       - Advance diet as tolerated. Manya Silvas, MD 07/19/2016 2:27:41 PM This report has been signed electronically. Number of Addenda: 0 Note Initiated On: 07/19/2016 1:50 PM Scope Withdrawal Time: 0 hours 9 minutes 42 seconds  Total Procedure Duration: 0 hours 13 minutes 45 seconds       [facility]

## 2016-07-19 NOTE — H&P (Signed)
Primary Care Physician:  Einar Pheasant, MD Primary Gastroenterologist:  Dr. Vira Agar  Pre-Procedure History & Physical: HPI:  Nicole Sims is a 65 y.o. female is here for an endoscopy and colonoscopy.   Past Medical History:  Diagnosis Date  . Anemia   . Anxiety   . Asthma   . Chronic headaches   . Diverticulosis   . Environmental allergies   . GERD (gastroesophageal reflux disease)   . Hypercholesterolemia   . Hyperglycemia   . Hypertension   . Osteoarthritis   . Palpitations   . Thrombocytosis ([place])   . Urinary incontinence    mixed    Past Surgical History:  Procedure Laterality Date  . BREAST CYST ASPIRATION Bilateral 2005   approximate year  . CERVICAL CONE BIOPSY     CIS  . CHOLECYSTECTOMY    . KNEE ARTHROSCOPY  08/13/08  . knee replacement and revision     left  . ROTATOR CUFF REPAIR     bilateral  . SHOULDER SURGERY  11/17/05  . TONSILLECTOMY  1962  . VAGINAL HYSTERECTOMY  1974   abnormal pap and carcinoma in situ    Prior to Admission medications   Medication Sig Start Date End Date Taking? Authorizing Provider  albuterol (PROVENTIL HFA;VENTOLIN HFA) 108 (90 Base) MCG/ACT inhaler Inhale 2 puffs into the lungs every 6 (six) hours as needed for wheezing or shortness of breath. 01/08/16  Yes Einar Pheasant, MD  aspirin EC 81 MG tablet Take 81 mg by mouth daily.   Yes Historical Provider, MD  cetirizine (ZYRTEC) 10 MG tablet Take 10 mg by mouth daily.   Yes Historical Provider, MD  clobetasol cream (TEMOVATE) [id number] % Apply 1 application topically 2 (two) times daily.   Yes Historical Provider, MD  fluticasone (FLONASE) 50 MCG/ACT nasal spray USE 2 SPRAYS IN EACH NOSTRIL EVERY DAY 01/29/15  Yes Einar Pheasant, MD  Fluticasone-Salmeterol (ADVAIR DISKUS) 250-50 MCG/DOSE AEPB Inhale 1 puff into the lungs 2 (two) times daily. 05/21/14  Yes Einar Pheasant, MD  meloxicam (MOBIC) 15 MG tablet Take 15 mg by mouth daily. 03/19/16  Yes Historical Provider, MD  metoprolol  succinate (TOPROL-XL) 50 MG 24 hr tablet TAKE 1 TABLET BY MOUTH EVERY DAY WITH OR IMMEDIATELY FOLLOWING A MEAL. 06/08/16  Yes Einar Pheasant, MD  Multiple Vitamin (MULTIVITAMIN) tablet Take 1 tablet by mouth daily.   Yes Historical Provider, MD  ondansetron (ZOFRAN) 4 MG tablet Take 1 tablet (4 mg total) by mouth 2 (two) times daily as needed for nausea or vomiting. 04/21/16  Yes Einar Pheasant, MD  Probiotic Product (PROBIOTIC DAILY PO) Take by mouth.   Yes Historical Provider, MD  RABEprazole (ACIPHEX) 20 MG tablet Take 20 mg by mouth 2 (two) times daily.   Yes Historical Provider, MD  venlafaxine XR (EFFEXOR-XR) 150 MG 24 hr capsule TAKE 1 CAPSULE BY MOUTH EVERY DAY WITH BREAKFAST 06/23/16  Yes Einar Pheasant, MD  VITAMIN E PO Take 1 tablet by mouth daily.   Yes Historical Provider, MD  albuterol (PROVENTIL HFA;VENTOLIN HFA) 108 (90 BASE) MCG/ACT inhaler Inhale 2 puffs into the lungs every 4 (four) hours as needed. Patient not taking: Reported on 07/19/2016 02/24/14   Einar Pheasant, MD  gabapentin (NEURONTIN) 100 MG capsule Take 100 mg by mouth 3 (three) times daily. 02/23/16   Historical Provider, MD  predniSONE (DELTASONE) 10 MG tablet Take 4 tablets x 1 day and then decrease by 1/2 tablet per day until down to zero mg. Patient  not taking: Reported on 07/19/2016 06/07/16   Einar Pheasant, MD    Allergies as of 06/11/2016 - Review Complete 06/11/2016  Allergen Reaction Noted  . Augmentin [amoxicillin-pot clavulanate] Other (See Comments) 08/21/2012  . Bactrim [sulfamethoxazole-trimethoprim]  08/21/2012  . Hydrocodone-acetaminophen Other (See Comments) 08/21/2012  . Pseudoephedrine  08/21/2012  . Relafen [nabumetone] Other (See Comments) 08/21/2012    Family History  Problem Relation Age of Onset  . Diabetes Mellitus II Father   . Thyroid disease Father   . Breast cancer Maternal Aunt   . Colon cancer Neg Hx     Social History   Social History  . Marital status: Married    Spouse  name: N/A  . Number of children: N/A  . Years of education: N/A   Occupational History  . Not on file.   Social History Main Topics  . Smoking status: Former Smoker    Quit date: 08/17/1983  . Smokeless tobacco: Never Used  . Alcohol use 0.0 oz/week     Comment: rarely  . Drug use: No  . Sexual activity: Not on file   Other Topics Concern  . Not on file   Social History Narrative  . No narrative on file    Review of Systems: See HPI, otherwise negative ROS  Physical Exam: BP 127/63   Pulse 86   Temp 97.3 F (36.3 C) (Tympanic)   Resp 18   Ht 5\' 5"  (1.651 m)   Wt 110.2 kg (243 lb)   SpO2 100%   BMI 40.44 kg/m  General:   Alert,  pleasant and cooperative in NAD Head:  Normocephalic and atraumatic. Neck:  Supple; no masses or thyromegaly. Lungs:  Clear throughout to auscultation.    Heart:  Regular rate and rhythm. Abdomen:  Soft, nontender and nondistended. Normal bowel sounds, without guarding, and without rebound.   Neurologic:  Alert and  oriented x4;  grossly normal neurologically.  Impression/Plan: [name] is here for an endoscopy and colonoscopy to be performed for generalized abdominal pain, dyspepsia  Risks, benefits, limitations, and alternatives regarding  endoscopy have been reviewed with the patient.  Questions have been answered.  All parties agreeable.   Gaylyn Cheers, MD  07/19/2016, 1:50 PM

## 2016-07-20 ENCOUNTER — Encounter: Payer: Self-pay | Admitting: Unknown Physician Specialty

## 2016-07-21 LAB — SURGICAL PATHOLOGY

## 2016-07-22 ENCOUNTER — Other Ambulatory Visit: Payer: Self-pay | Admitting: Internal Medicine

## 2016-07-22 ENCOUNTER — Ambulatory Visit
Admission: RE | Admit: 2016-07-22 | Discharge: 2016-07-22 | Disposition: A | Discharge status: Home / Self Care | Payer: BLUE CROSS/BLUE SHIELD | Source: Ambulatory Visit | Attending: Internal Medicine | Admitting: Internal Medicine

## 2016-07-22 DIAGNOSIS — E2839 Other primary ovarian failure: Secondary | ICD-10-CM | POA: Insufficient documentation

## 2016-07-22 DIAGNOSIS — Z1239 Encounter for other screening for malignant neoplasm of breast: Secondary | ICD-10-CM

## 2016-07-22 DIAGNOSIS — Z1231 Encounter for screening mammogram for malignant neoplasm of breast: Secondary | ICD-10-CM | POA: Diagnosis present

## 2016-07-22 DIAGNOSIS — M85852 Other specified disorders of bone density and structure, left thigh: Secondary | ICD-10-CM | POA: Insufficient documentation

## 2016-07-22 DIAGNOSIS — R928 Other abnormal and inconclusive findings on diagnostic imaging of breast: Secondary | ICD-10-CM

## 2016-07-22 DIAGNOSIS — N6310 Unspecified lump in the right breast, unspecified quadrant: Secondary | ICD-10-CM

## 2016-07-23 ENCOUNTER — Other Ambulatory Visit: Payer: Self-pay | Admitting: Internal Medicine

## 2016-07-23 ENCOUNTER — Encounter: Payer: Self-pay | Admitting: Internal Medicine

## 2016-07-23 DIAGNOSIS — R928 Other abnormal and inconclusive findings on diagnostic imaging of breast: Secondary | ICD-10-CM

## 2016-07-23 NOTE — Progress Notes (Signed)
Order placed for f/u right breast mammogram and possible ultrasound.  

## 2016-08-03 ENCOUNTER — Other Ambulatory Visit: Payer: Self-pay | Admitting: Internal Medicine

## 2016-08-03 NOTE — Telephone Encounter (Signed)
Please advise on refills. I do not see where the patient is taking Crestor.

## 2016-08-12 ENCOUNTER — Encounter: Payer: Self-pay | Admitting: Internal Medicine

## 2016-08-12 ENCOUNTER — Ambulatory Visit (INDEPENDENT_AMBULATORY_CARE_PROVIDER_SITE_OTHER): Payer: BLUE CROSS/BLUE SHIELD | Admitting: Internal Medicine

## 2016-08-12 DIAGNOSIS — I10 Essential (primary) hypertension: Secondary | ICD-10-CM

## 2016-08-12 DIAGNOSIS — H538 Other visual disturbances: Secondary | ICD-10-CM

## 2016-08-12 DIAGNOSIS — D473 Essential (hemorrhagic) thrombocythemia: Secondary | ICD-10-CM

## 2016-08-12 DIAGNOSIS — L900 Lichen sclerosus et atrophicus: Secondary | ICD-10-CM | POA: Diagnosis not present

## 2016-08-12 DIAGNOSIS — F439 Reaction to severe stress, unspecified: Secondary | ICD-10-CM | POA: Diagnosis not present

## 2016-08-12 DIAGNOSIS — E7800 Pure hypercholesterolemia, unspecified: Secondary | ICD-10-CM

## 2016-08-12 DIAGNOSIS — D75839 Thrombocytosis, unspecified: Secondary | ICD-10-CM

## 2016-08-12 DIAGNOSIS — K219 Gastro-esophageal reflux disease without esophagitis: Secondary | ICD-10-CM

## 2016-08-12 DIAGNOSIS — R002 Palpitations: Secondary | ICD-10-CM

## 2016-08-12 DIAGNOSIS — R739 Hyperglycemia, unspecified: Secondary | ICD-10-CM

## 2016-08-12 NOTE — Progress Notes (Signed)
Patient ID: Nicole Sims, female   DOB: 01-Oct-1950, 65 y.o.   MRN: [id number]   Subjective:    Patient ID: Nicole Sims, female    DOB: 1950/12/11, 65 y.o.   MRN: [id number]  HPI  Patient here for a scheduled follow up.  Increased stress with family issues.  Her son is living with her.  Her grandson is in prison.  Other family stress as well.  Discussed with her today.  Continues on effexor.  Will follow.  She recently saw gyn.  Has been diagnosed with lichen sclerosus.  Using clobetasol.  Is helping.  Recently had EGD and colonoscopy.  One polyp in the descending colon.  EGD - reflux esophagitis and gastritis.  On aciphex and carafate.  Continues f/u with GI.  Not watching her diet.  Discussed diet and exercise.  No chest pain.  Some palpitations.  She declines further evaluation at this time.  Breathing stable.  No abdominal pain.  She is off mobic.  Stopped secondary to her GI issues.  Scheduled for f/u views mammogram - 08/19/16.  Saw opthalmology for blurred vision.  States told had dry eyes.  She has had a few more episodes.  We discussed further w/up.  She declines at this time.  Did agree to f/u with opthalmology to confirm diagnosis.     Past Medical History:  Diagnosis Date  . Anemia   . Anxiety   . Asthma   . Chronic headaches   . Diverticulosis   . Environmental allergies   . GERD (gastroesophageal reflux disease)   . Hypercholesterolemia   . Hyperglycemia   . Hypertension   . Osteoarthritis   . Palpitations   . Thrombocytosis ([place])   . Urinary incontinence    mixed   Past Surgical History:  Procedure Laterality Date  . BREAST CYST ASPIRATION Bilateral 2005   approximate year  . CERVICAL CONE BIOPSY     CIS  . CHOLECYSTECTOMY    . COLONOSCOPY WITH PROPOFOL N/A 07/19/2016   Procedure: COLONOSCOPY WITH PROPOFOL;  Surgeon: Manya Silvas, MD;  Location: [facility] ENDOSCOPY;  Service: Endoscopy;  Laterality: N/A;  . ESOPHAGOGASTRODUODENOSCOPY (EGD) WITH PROPOFOL N/A 07/19/2016     Procedure: ESOPHAGOGASTRODUODENOSCOPY (EGD) WITH PROPOFOL;  Surgeon: Manya Silvas, MD;  Location: [facility] ENDOSCOPY;  Service: Endoscopy;  Laterality: N/A;  . KNEE ARTHROSCOPY  08/13/08  . knee replacement and revision     left  . ROTATOR CUFF REPAIR     bilateral  . SHOULDER SURGERY  11/17/05  . TONSILLECTOMY  1962  . VAGINAL HYSTERECTOMY  1974   abnormal pap and carcinoma in situ   Family History  Problem Relation Age of Onset  . Diabetes Mellitus II Father   . Thyroid disease Father   . Breast cancer Maternal Aunt   . Colon cancer Neg Hx    Social History   Social History  . Marital status: Married    Spouse name: N/A  . Number of children: N/A  . Years of education: N/A   Social History Main Topics  . Smoking status: Former Smoker    Quit date: 08/17/1983  . Smokeless tobacco: Never Used  . Alcohol use 0.0 oz/week     Comment: rarely  . Drug use: No  . Sexual activity: Not Asked   Other Topics Concern  . None   Social History Narrative  . None    Outpatient Encounter Prescriptions as of 08/12/2016  Medication Sig  . albuterol (PROVENTIL HFA;VENTOLIN  HFA) 108 (90 BASE) MCG/ACT inhaler Inhale 2 puffs into the lungs every 4 (four) hours as needed.  Marland Kitchen albuterol (PROVENTIL HFA;VENTOLIN HFA) 108 (90 Base) MCG/ACT inhaler Inhale 2 puffs into the lungs every 6 (six) hours as needed for wheezing or shortness of breath.  Marland Kitchen aspirin EC 81 MG tablet Take 81 mg by mouth daily.  . cetirizine (ZYRTEC) 10 MG tablet Take 10 mg by mouth daily.  . clobetasol cream (TEMOVATE) 4.66 % Apply 1 application topically 2 (two) times daily.  . fluticasone (FLONASE) 50 MCG/ACT nasal spray USE 2 SPRAYS IN EACH NOSTRIL EVERY DAY  . Fluticasone-Salmeterol (ADVAIR DISKUS) 250-50 MCG/DOSE AEPB Inhale 1 puff into the lungs 2 (two) times daily.  Marland Kitchen gabapentin (NEURONTIN) 100 MG capsule Take 100 mg by mouth 3 (three) times daily.  . meloxicam (MOBIC) 15 MG tablet Take 15 mg by mouth daily.  .  metoprolol succinate (TOPROL-XL) 50 MG 24 hr tablet TAKE 1 TABLET BY MOUTH EVERY DAY WITH OR IMMEDIATELY FOLLOWING A MEAL.  . Multiple Vitamin (MULTIVITAMIN) tablet Take 1 tablet by mouth daily.  . ondansetron (ZOFRAN) 4 MG tablet Take 1 tablet (4 mg total) by mouth 2 (two) times daily as needed for nausea or vomiting.  . Probiotic Product (PROBIOTIC DAILY PO) Take by mouth.  . RABEprazole (ACIPHEX) 20 MG tablet Take 20 mg by mouth 2 (two) times daily.  . rosuvastatin (CRESTOR) 5 MG tablet TAKE 1 TABLET BY MOUTH EVERY DAY  . sucralfate (CARAFATE) 1 g tablet Take 1 g by mouth 3 (three) times daily.  Marland Kitchen venlafaxine XR (EFFEXOR-XR) 150 MG 24 hr capsule TAKE 1 CAPSULE BY MOUTH EVERY DAY WITH BREAKFAST  . VITAMIN E PO Take 1 tablet by mouth daily.  . [DISCONTINUED] predniSONE (DELTASONE) 10 MG tablet Take 4 tablets x 1 day and then decrease by 1/2 tablet per day until down to zero mg.   No facility-administered encounter medications on file as of 08/12/2016.     Review of Systems  Constitutional: Negative for appetite change and unexpected weight change.  HENT: Negative for congestion and sinus pressure.   Eyes: Positive for visual disturbance. Negative for pain.  Respiratory: Negative for cough, chest tightness and shortness of breath.   Cardiovascular: Positive for palpitations. Negative for chest pain and leg swelling.  Gastrointestinal: Negative for abdominal pain, diarrhea, nausea and vomiting.  Genitourinary: Negative for difficulty urinating and dysuria.  Musculoskeletal: Negative for joint swelling and myalgias.  Skin: Negative for color change and rash.  Neurological: Negative for dizziness, syncope and light-headedness.  Psychiatric/Behavioral: Negative for agitation.       Increased stress as outlined.         Objective:    Physical Exam  Constitutional: She appears well-developed and well-nourished. No distress.  HENT:  Nose: Nose normal.  Mouth/Throat: Oropharynx is clear  and moist.  Neck: Neck supple. No thyromegaly present.  Cardiovascular: Normal rate and regular rhythm.   Pulmonary/Chest: Breath sounds normal. No respiratory distress. She has no wheezes.  Abdominal: Soft. Bowel sounds are normal. There is no tenderness.  Musculoskeletal: She exhibits no edema or tenderness.  Lymphadenopathy:    She has no cervical adenopathy.  Skin: No rash noted. No erythema.  Psychiatric: She has a normal mood and affect. Her behavior is normal.    BP 124/78   Pulse 93   Temp 97.7 F (36.5 C) (Oral)   Ht _0  (1.651 m)   Wt 252 lb 6.4 oz (114.5 kg)  SpO2 95%   BMI 42.00 kg/m  Wt Readings from Last 3 Encounters:  08/12/16 252 lb 6.4 oz (114.5 kg)  07/19/16 243 lb (110.2 kg)  06/07/16 252 lb 12 oz (114.6 kg)     Lab Results  Component Value Date   WBC 10.1 03/22/2016   HGB 12.5 03/22/2016   HCT 37.5 03/22/2016   PLT 463.0 (H) 03/22/2016   GLUCOSE 95 04/15/2016   CHOL 199 03/22/2016   TRIG 135.0 03/22/2016   HDL 51.20 03/22/2016   LDLDIRECT 172.8 09/17/2013   LDLCALC 121 (H) 03/22/2016   ALT 24 03/22/2016   AST 20 03/22/2016   NA 139 04/15/2016   K 4.0 04/15/2016   CL 105 04/15/2016   CREATININE 1.03 04/15/2016   BUN 15 04/15/2016   CO2 27 04/15/2016   TSH 1.88 04/28/2015   INR 0.9 07/02/2013   HGBA1C 6.0 03/22/2016   MICROALBUR 1.1 04/28/2015    Dg Bone Density  Result Date: 07/22/2016 EXAM: DUAL X-RAY ABSORPTIOMETRY (DXA) FOR BONE MINERAL DENSITY IMPRESSION: Dear Dr. Einar Pheasant, Your patient Nicole Sims completed a FRAX assessment on 07/22/2016 using the [place] (analysis version: 14.10) manufactured by [employer]. The following summarizes the results of our evaluation. PATIENT BIOGRAPHICAL: Name: Ling, Flesch Patient ID: [id number] Birth Date: [birth year] Height:    65.0 in. Gender:     Female    [age]       Weight:    243.0 lbs. Ethnicity:  White                            Exam Date: 07/22/2016 FRAX*  RESULTS:  (version: 3.5) 10-year Probability of Fracture1 Major Osteoporotic Fracture2 Hip Fracture 8.0% 0.8% Population: [place] (Caucasian) Risk Factors: None Based on Femur (Left) Neck BMD 1 -The 10-year probability of fracture may be lower than reported if the patient has received treatment. 2 -Major Osteoporotic Fracture: Clinical Spine, Forearm, Hip or Shoulder *FRAX is a Materials engineer of the [employer] for Metabolic Bone Disease, a [place] (WHO) [facility]. ASSESSMENT: The probability of a major osteoporotic fracture is 8.0% within the next ten years. The probability of a hip fracture is 0.8% within the next ten years. . Dear Dr. Einar Pheasant, Your patient [name] completed a BMD test on 07/22/2016 using the [place] (analysis version: 14.10) manufactured by [employer]. The following summarizes the results of our evaluation. PATIENT BIOGRAPHICAL: Name: Annais, Crafts Patient ID: [id number] Birth Date: [birth year] Height: 65.0 in. Gender: Female Exam Date: 07/22/2016 Weight: 243.0 lbs. Indications: Caucasian, Hysterectomy, Postmenopausal Fractures: Treatments: Multi-Vitamin with calcium ASSESSMENT: The BMD measured at Femur Neck Left is 0.838 g/cm2 with a T-score of -1.4. This patient is considered osteopenic according to [facility]) criteria. Site Region Measured Measured WHO Young Adult BMD Date       Age      Classification T-score AP Spine L1-L4 07/22/2016 66.2 Normal 0.1 1.202 g/cm2 DualFemur Neck Left 07/22/2016 66.2 Osteopenia -1.4 0.838 g/cm2 World Health Organization [facility]) criteria for post-menopausal, Caucasian Women: Normal:       T-score at or above -1 SD Osteopenia:   T-score between -1 and -2.5 SD Osteoporosis: T-score at or below -2.5 SD RECOMMENDATIONS: [place] recommends that FDA-approved medical therapies be considered in postmenopausal women and men age 66 or older with  a:  1. Hip or vertebral (clinical or morphometric) fracture. 2. T-score of < -2.5 at the spine or hip. 3. Ten-year fracture probability by FRAX of 3% or greater for hip fracture or 20% or greater for major osteoporotic fracture. All treatment decisions require clinical judgment and consideration of individual patient factors, including patient preferences, co-morbidities, previous drug use, risk factors not captured in the FRAX model (e.g. falls, vitamin D deficiency, increased bone turnover, interval significant decline in bone density) and possible under - or over-estimation of fracture risk by FRAX. All patients should ensure an adequate intake of dietary calcium (1200 mg/d) and vitamin D (800 IU daily) unless contraindicated. FOLLOW-UP: People with diagnosed cases of osteoporosis or at high risk for fracture should have regular bone mineral density tests. For patients eligible for Medicare, routine testing is allowed once every 2 years. The testing frequency can be increased to one year for patients who have rapidly progressing disease, those who are receiving or discontinuing medical therapy to restore bone mass, or have additional risk factors. I have reviewed this report, and agree with the above findings. [facility] Electronically Signed   By: Lahoma Crocker M.D.   On: 07/22/2016 11:36   Mm Screening Breast Tomo Bilateral  Result Date: 07/22/2016 CLINICAL DATA:  Screening. EXAM: 2D DIGITAL SCREENING BILATERAL MAMMOGRAM WITH CAD AND ADJUNCT TOMO COMPARISON:  Previous exam(s). ACR Breast Density Category c: The breast tissue is heterogeneously dense, which may obscure small masses. FINDINGS: In the right breast, a possible mass warrants further evaluation. In the left breast, no findings suspicious for malignancy. Images were processed with CAD. IMPRESSION: Further evaluation is suggested for possible mass in the right breast. RECOMMENDATION: Diagnostic mammogram and possibly ultrasound of the right  breast. (Code:[id number]) The patient will be contacted regarding the findings, and additional imaging will be scheduled. BI-RADS CATEGORY  0: Incomplete. Need additional imaging evaluation and/or prior mammograms for comparison. Electronically Signed   By: Ammie Ferrier M.D.   On: 07/22/2016 13:51       Assessment & Plan:   Problem List Items Addressed This Visit    Blurred vision    Had carotid ultrasound - no hemodynamically significant stenosis.  Saw opthalmology.  States - dry eyes.  Has had a few more episodes.  Will f/u with opthalmology to confirm diagnosis.  Desires no further w/up or evaluation at this time.        GERD (gastroesophageal reflux disease)    EGD as outlined.  On aciphex.  GI added carafate.  Continue f/u with GI.       Hypercholesterolemia    On crestor.  Low cholesterol diet and exercise.  Follow lipid panel and liver function tests.        Relevant Orders   Hepatic function panel   Lipid panel   Hyperglycemia    Low carb diet and exercise.  Follow met b and a1c.       Relevant Orders   Hemoglobin A1c   Hypertension    Blood pressure under good control.  Continue same medication regimen.  Follow pressures.  Follow metabolic panel.        Relevant Orders   TSH   Basic metabolic panel   Lichen sclerosus    Seeing gyn.  Using clobetasol.        Palpitations    On metoprolol.  Has noticed recently.  Discussed further evaluation.  She declines.  Follow.  No chest pain.  Breathing stable.  Severe obesity (BMI >= 40) (HCC)    Diet and exercise.  Follow.       Stress    Increased stress as outlined.  Discussed with her today.  On effexor.  Follow.        Thrombocytosis (HCC)    Platelet count slightly elevated.  Follow.        Relevant Orders   CBC with Differential/Platelet     I spent 40 minutes with the patient and more than 50% of the time was spent in consultation regarding the above.  Time spent discussing her increased stress  and family issues.  Also, discussed her current symptoms and concerns and recent evaluations.  Discussed plan for treatment.     Einar Pheasant, MD

## 2016-08-12 NOTE — Progress Notes (Signed)
Pre visit review using our clinic review tool, if applicable. No additional management support is needed unless otherwise documented below in the visit note. 

## 2016-08-13 ENCOUNTER — Encounter: Payer: Self-pay | Admitting: Internal Medicine

## 2016-08-13 ENCOUNTER — Telehealth: Payer: Self-pay | Admitting: Internal Medicine

## 2016-08-13 MED ORDER — NYSTATIN 100000 UNIT/ML MT SUSP: OROMUCOSAL | 0 refills | Status: DC

## 2016-08-13 NOTE — Assessment & Plan Note (Signed)
Low carb diet and exercise.  Follow met b and a1c.  

## 2016-08-13 NOTE — Assessment & Plan Note (Signed)
Had carotid ultrasound - no hemodynamically significant stenosis.  Saw opthalmology.  States - dry eyes.  Has had a few more episodes.  Will f/u with opthalmology to confirm diagnosis.  Desires no further w/up or evaluation at this time.

## 2016-08-13 NOTE — Assessment & Plan Note (Signed)
Blood pressure under good control.  Continue same medication regimen.  Follow pressures.  Follow metabolic panel.   

## 2016-08-13 NOTE — Telephone Encounter (Signed)
Notified patient.

## 2016-08-13 NOTE — Assessment & Plan Note (Signed)
Increased stress as outlined.  Discussed with her today.  On effexor.  Follow.   

## 2016-08-13 NOTE — Assessment & Plan Note (Signed)
Seeing gyn.  Using clobetasol.   

## 2016-08-13 NOTE — Assessment & Plan Note (Signed)
Platelet count slightly elevated.  Follow.

## 2016-08-13 NOTE — Assessment & Plan Note (Signed)
Diet and exercise.  Follow.  

## 2016-08-13 NOTE — Telephone Encounter (Signed)
I have sent in nystatin suspension for her.  She will swish and spit tid prn.

## 2016-08-13 NOTE — Telephone Encounter (Signed)
Pt states that she was supposed to have some type of mouth wash sent to her pharmacy yesterday..pt is unsure of the name.. Please advise

## 2016-08-13 NOTE — Assessment & Plan Note (Signed)
On metoprolol.  Has noticed recently.  Discussed further evaluation.  She declines.  Follow.  No chest pain.  Breathing stable.

## 2016-08-13 NOTE — Assessment & Plan Note (Signed)
EGD as outlined.  On aciphex.  GI added carafate.  Continue f/u with GI.

## 2016-08-13 NOTE — Assessment & Plan Note (Signed)
On crestor.  Low cholesterol diet and exercise.  Follow lipid panel and liver function tests.   

## 2016-08-13 NOTE — Telephone Encounter (Signed)
Please advise 

## 2016-08-19 ENCOUNTER — Ambulatory Visit
Admission: RE | Admit: 2016-08-19 | Discharge: 2016-08-19 | Disposition: A | Discharge status: Home / Self Care | Payer: BLUE CROSS/BLUE SHIELD | Source: Ambulatory Visit | Attending: Internal Medicine | Admitting: Internal Medicine

## 2016-08-19 ENCOUNTER — Other Ambulatory Visit (INDEPENDENT_AMBULATORY_CARE_PROVIDER_SITE_OTHER): Payer: BLUE CROSS/BLUE SHIELD

## 2016-08-19 ENCOUNTER — Encounter: Payer: Self-pay | Admitting: Internal Medicine

## 2016-08-19 ENCOUNTER — Other Ambulatory Visit: Payer: Self-pay | Admitting: Internal Medicine

## 2016-08-19 DIAGNOSIS — R928 Other abnormal and inconclusive findings on diagnostic imaging of breast: Secondary | ICD-10-CM

## 2016-08-19 DIAGNOSIS — D75839 Thrombocytosis, unspecified: Secondary | ICD-10-CM

## 2016-08-19 DIAGNOSIS — N6310 Unspecified lump in the right breast, unspecified quadrant: Secondary | ICD-10-CM

## 2016-08-19 DIAGNOSIS — I10 Essential (primary) hypertension: Secondary | ICD-10-CM

## 2016-08-19 DIAGNOSIS — D473 Essential (hemorrhagic) thrombocythemia: Secondary | ICD-10-CM

## 2016-08-19 DIAGNOSIS — R739 Hyperglycemia, unspecified: Secondary | ICD-10-CM | POA: Diagnosis not present

## 2016-08-19 DIAGNOSIS — E7800 Pure hypercholesterolemia, unspecified: Secondary | ICD-10-CM | POA: Diagnosis not present

## 2016-08-19 DIAGNOSIS — N6313 Unspecified lump in the right breast, lower outer quadrant: Secondary | ICD-10-CM | POA: Diagnosis not present

## 2016-08-19 LAB — CBC WITH DIFFERENTIAL/PLATELET
BASOS ABS: 0.1 10*3/uL (ref 0.0–0.1)
BASOS PCT: 0.8 % (ref 0.0–3.0)
EOS ABS: 0.2 10*3/uL (ref 0.0–0.7)
Eosinophils Relative: 2.2 % (ref 0.0–5.0)
HCT: 38.9 % (ref 36.0–46.0)
Hemoglobin: 13 g/dL (ref 12.0–15.0)
Lymphocytes Relative: 34.9 % (ref 12.0–46.0)
Lymphs Abs: 3 10*3/uL (ref 0.7–4.0)
MCHC: 33.5 g/dL (ref 30.0–36.0)
MCV: 83.5 fl (ref 78.0–100.0)
MONO ABS: 0.7 10*3/uL (ref 0.1–1.0)
Monocytes Relative: 7.6 % (ref 3.0–12.0)
NEUTROS ABS: 4.7 10*3/uL (ref 1.4–7.7)
Neutrophils Relative %: 54.5 % (ref 43.0–77.0)
Platelets: 467 10*3/uL — ABNORMAL HIGH (ref 150.0–400.0)
RBC: 4.66 Mil/uL (ref 3.87–5.11)
RDW: 14.6 % (ref 11.5–15.5)
WBC: 8.7 10*3/uL (ref 4.0–10.5)

## 2016-08-19 LAB — BASIC METABOLIC PANEL
BUN: 10 mg/dL (ref 6–23)
CALCIUM: 9.4 mg/dL (ref 8.4–10.5)
CO2: 29 meq/L (ref 19–32)
Chloride: 108 mEq/L (ref 96–112)
Creatinine, Ser: 1.07 mg/dL (ref 0.40–1.20)
GFR: 54.47 mL/min — AB (ref >60.00)
GLUCOSE: 106 mg/dL — AB (ref 70–99)
Potassium: 4.5 mEq/L (ref 3.5–5.1)
SODIUM: 143 meq/L (ref 135–145)

## 2016-08-19 LAB — LIPID PANEL
CHOL/HDL RATIO: 4
Cholesterol: 212 mg/dL — ABNORMAL HIGH (ref 0–200)
HDL: 51.6 mg/dL (ref >39.00)
LDL Cholesterol: 128 mg/dL — ABNORMAL HIGH (ref 0–99)
NONHDL: 160.01
TRIGLYCERIDES: 160 mg/dL — AB (ref 0.0–149.0)
VLDL: 32 mg/dL (ref 0.0–40.0)

## 2016-08-19 LAB — HEPATIC FUNCTION PANEL
ALK PHOS: 78 U/L (ref 39–117)
ALT: 18 U/L (ref 0–35)
AST: 19 U/L (ref 0–37)
Albumin: 4.1 g/dL (ref 3.5–5.2)
BILIRUBIN DIRECT: 0.1 mg/dL (ref 0.0–0.3)
BILIRUBIN TOTAL: 0.4 mg/dL (ref 0.2–1.2)
TOTAL PROTEIN: 7.3 g/dL (ref 6.0–8.3)

## 2016-08-19 LAB — TSH: TSH: 1.82 u[IU]/mL (ref 0.35–4.50)

## 2016-08-19 LAB — HEMOGLOBIN A1C: Hgb A1c MFr Bld: 6.1 % (ref 4.6–6.5)

## 2016-08-19 NOTE — Progress Notes (Signed)
Order placed for f/u right breast mammogram and ultrasound  

## 2016-08-25 ENCOUNTER — Ambulatory Visit: Payer: BLUE CROSS/BLUE SHIELD | Admitting: Family Medicine

## 2016-09-03 ENCOUNTER — Telehealth: Payer: Self-pay | Admitting: *Deleted

## 2016-09-03 ENCOUNTER — Ambulatory Visit: Payer: Self-pay | Admitting: Internal Medicine

## 2016-09-03 NOTE — Telephone Encounter (Signed)
FYI

## 2016-09-03 NOTE — Telephone Encounter (Signed)
Patient stated she is having sob some chest pain and tightness, patient that nebulizer was used but was old 2013, the rescue inhaler relieved yesterday but no today . The tightness is always there , but coughing makes it worse. Patient was DX with FLu 2 .5 weeks ago, By urgent care without testing due to patient husband had tested positive, given tamiflu. Patient has a 3.45 appointment at [facility] today and willing to keep ok to wait.

## 2016-09-03 NOTE — Telephone Encounter (Signed)
If she is having sob and chest pain, I recommend going ahead and being evaluated to confirm diagnosis.

## 2016-09-03 NOTE — Telephone Encounter (Signed)
Pt was transferred to [facility] for chest pain/ and pain through her shoulders. Pt had a cold last week and seem to think her pain is coming from the cold/flu Pt contact [phone]

## 2016-09-03 NOTE — Telephone Encounter (Signed)
Patient Name: Nicole Sims DOB: 03/11/1951 Initial Comment Caller states patient is having chest pain and pain throughout her shoulder blades, she believes it is from a previous condition that she had with the flu two weeks ago. Nurse Assessment Nurse: Ronnald Ramp, RN, Miranda Date/Time (Eastern Time): 09/03/2016 1:23:26 PM Confirm and document reason for call. If symptomatic, describe symptoms. ---Caller states she has had increased productive cough for 2 nights. She is having tightness her chest and between her shoulder blades. Denies fever. Albuterol inhaler TID but does back to back treatments each time, and used neb about 45 min ago (but expired med). Does the patient have any new or worsening symptoms? ---Yes Will a triage be completed? ---Yes Related visit to physician within the last 2 weeks? ---No Does the PT have any chronic conditions? (i.e. diabetes, asthma, etc.) ---Yes List chronic conditions. ---Asthma, Migraines, High Cholesterol, HTN, Palpitations, IBS Is this a behavioral health or substance abuse call? ---No Guidelines Guideline Title Affirmed Question Affirmed Notes Asthma Attack [1] MILD asthma attack (e.g., no SOB at rest, mild SOB with walking, speaks normally in sentences, mild wheezing) AND [2] persists > 24 hours on appropriate treatment Final Disposition User See Physician within 24 Hours Ronnald Ramp, RN, Miranda Comments No appt available at primary care office, appt scheduled for 3:45pm with Dr. Quay Burow at [facility] for today. Referrals GO TO FACILITY OTHER - SPECIFY Disagree/Comply: Comply

## 2016-09-03 NOTE — Telephone Encounter (Signed)
Patient going to ER has increased chest pain with SOB.

## 2016-09-03 NOTE — Telephone Encounter (Signed)
Duplicate.  See attached.   

## 2016-10-18 ENCOUNTER — Encounter: Payer: Self-pay | Admitting: Internal Medicine

## 2016-10-18 ENCOUNTER — Ambulatory Visit (INDEPENDENT_AMBULATORY_CARE_PROVIDER_SITE_OTHER): Payer: BLUE CROSS/BLUE SHIELD | Admitting: Internal Medicine

## 2016-10-18 DIAGNOSIS — H04123 Dry eye syndrome of bilateral lacrimal glands: Secondary | ICD-10-CM | POA: Diagnosis not present

## 2016-10-18 DIAGNOSIS — D649 Anemia, unspecified: Secondary | ICD-10-CM | POA: Diagnosis not present

## 2016-10-18 DIAGNOSIS — L900 Lichen sclerosus et atrophicus: Secondary | ICD-10-CM

## 2016-10-18 DIAGNOSIS — F439 Reaction to severe stress, unspecified: Secondary | ICD-10-CM

## 2016-10-18 DIAGNOSIS — E7800 Pure hypercholesterolemia, unspecified: Secondary | ICD-10-CM

## 2016-10-18 DIAGNOSIS — K219 Gastro-esophageal reflux disease without esophagitis: Secondary | ICD-10-CM

## 2016-10-18 DIAGNOSIS — D75839 Thrombocytosis, unspecified: Secondary | ICD-10-CM

## 2016-10-18 DIAGNOSIS — I10 Essential (primary) hypertension: Secondary | ICD-10-CM | POA: Diagnosis not present

## 2016-10-18 DIAGNOSIS — D473 Essential (hemorrhagic) thrombocythemia: Secondary | ICD-10-CM | POA: Diagnosis not present

## 2016-10-18 DIAGNOSIS — R739 Hyperglycemia, unspecified: Secondary | ICD-10-CM

## 2016-10-18 NOTE — Progress Notes (Signed)
Pre-visit discussion using our clinic review tool. No additional management support is needed unless otherwise documented below in the visit note.  

## 2016-10-18 NOTE — Progress Notes (Signed)
Patient ID: Nicole Sims, female   DOB: 03-01-1951, 66 y.o.   MRN: [id number]   Subjective:    Patient ID: Nicole Sims, female    DOB: Jun 28, 1951, 66 y.o.   MRN: [id number]  HPI  Patient here for a scheduled follow up. She has been having problems with dry, itchy irritated eyes.  They are watering as well.  Has tried some various drops.  No relief.  Saw gyn 08/05/16.  Instructed to continue clobetasol.  Recommended f/u in 6 months.  Had mammogram 08/29/16 - Birads III.  Recommended f/u right breast mammogram and ultrasound in 6 months.  Saw GI 09/23/16.  On aciphex.  Changed to q day.  Continue carafate.  Was placed on bentyl.  Is offf now.  Did not tolerate.  No chest pain.  Breathing stable.  Has cut back on her work.  This will help with her stress.     Past Medical History:  Diagnosis Date  . Anemia   . Anxiety   . Asthma   . Chronic headaches   . Diverticulosis   . Environmental allergies   . GERD (gastroesophageal reflux disease)   . Hypercholesterolemia   . Hyperglycemia   . Hypertension   . Osteoarthritis   . Palpitations   . Thrombocytosis ([place])   . Urinary incontinence    mixed   Past Surgical History:  Procedure Laterality Date  . BREAST CYST ASPIRATION Bilateral 2005   approximate year  . CERVICAL CONE BIOPSY     CIS  . CHOLECYSTECTOMY    . COLONOSCOPY WITH PROPOFOL N/A 07/19/2016   Procedure: COLONOSCOPY WITH PROPOFOL;  Surgeon: Manya Silvas, MD;  Location: [facility] ENDOSCOPY;  Service: Endoscopy;  Laterality: N/A;  . ESOPHAGOGASTRODUODENOSCOPY (EGD) WITH PROPOFOL N/A 07/19/2016   Procedure: ESOPHAGOGASTRODUODENOSCOPY (EGD) WITH PROPOFOL;  Surgeon: Manya Silvas, MD;  Location: [facility] ENDOSCOPY;  Service: Endoscopy;  Laterality: N/A;  . KNEE ARTHROSCOPY  08/13/08  . knee replacement and revision     left  . ROTATOR CUFF REPAIR     bilateral  . SHOULDER SURGERY  11/17/05  . TONSILLECTOMY  1962  . VAGINAL HYSTERECTOMY  1974   abnormal pap and carcinoma in situ    Family History  Problem Relation Age of Onset  . Diabetes Mellitus II Father   . Thyroid disease Father   . Breast cancer Maternal Aunt   . Colon cancer Neg Hx    Social History   Social History  . Marital status: Married    Spouse name: N/A  . Number of children: N/A  . Years of education: N/A   Social History Main Topics  . Smoking status: Former Smoker    Quit date: 08/17/1983  . Smokeless tobacco: Never Used  . Alcohol use 0.0 oz/week     Comment: rarely  . Drug use: No  . Sexual activity: Not Asked   Other Topics Concern  . None   Social History Narrative  . None    Outpatient Encounter Prescriptions as of 10/18/2016  Medication Sig  . albuterol (PROVENTIL HFA;VENTOLIN HFA) 108 (90 BASE) MCG/ACT inhaler Inhale 2 puffs into the lungs every 4 (four) hours as needed.  Marland Kitchen albuterol (PROVENTIL HFA;VENTOLIN HFA) 108 (90 Base) MCG/ACT inhaler Inhale 2 puffs into the lungs every 6 (six) hours as needed for wheezing or shortness of breath.  Marland Kitchen aspirin EC 81 MG tablet Take 81 mg by mouth daily.  . cetirizine (ZYRTEC) 10 MG tablet Take 10 mg by mouth  daily.  . clobetasol cream (TEMOVATE) [id number] % Apply 1 application topically 2 (two) times daily.  . Fluticasone-Salmeterol (ADVAIR DISKUS) 250-50 MCG/DOSE AEPB Inhale 1 puff into the lungs 2 (two) times daily.  Marland Kitchen gabapentin (NEURONTIN) 100 MG capsule Take 100 mg by mouth 3 (three) times daily.  . metoprolol succinate (TOPROL-XL) 50 MG 24 hr tablet TAKE 1 TABLET BY MOUTH EVERY DAY WITH OR IMMEDIATELY FOLLOWING A MEAL.  . Multiple Vitamin (MULTIVITAMIN) tablet Take 1 tablet by mouth daily.  . ondansetron (ZOFRAN) 4 MG tablet Take 1 tablet (4 mg total) by mouth 2 (two) times daily as needed for nausea or vomiting.  . Probiotic Product (PROBIOTIC DAILY PO) Take by mouth.  . RABEprazole (ACIPHEX) 20 MG tablet Take 20 mg by mouth 2 (two) times daily.  . rosuvastatin (CRESTOR) 5 MG tablet TAKE 1 TABLET BY MOUTH EVERY DAY  . sucralfate  (CARAFATE) 1 g tablet Take 1 g by mouth 3 (three) times daily.  Marland Kitchen venlafaxine XR (EFFEXOR-XR) 150 MG 24 hr capsule TAKE 1 CAPSULE BY MOUTH EVERY DAY WITH BREAKFAST  . VITAMIN E PO Take 1 tablet by mouth daily.  . [DISCONTINUED] meloxicam (MOBIC) 15 MG tablet Take 15 mg by mouth daily.  . [DISCONTINUED] nystatin (MYCOSTATIN) 100000 UNIT/ML suspension 72ml - Swish and spit tid prn  . [DISCONTINUED] fluticasone (FLONASE) 50 MCG/ACT nasal spray USE 2 SPRAYS IN EACH NOSTRIL EVERY DAY (Patient not taking: Reported on 10/18/2016)   No facility-administered encounter medications on file as of 10/18/2016.     Review of Systems  Constitutional: Negative for appetite change and unexpected weight change.  HENT: Negative for congestion and sinus pressure.   Eyes: Positive for itching.       Irritation.  Watering.    Respiratory: Negative for cough, chest tightness and shortness of breath.   Cardiovascular: Negative for chest pain, palpitations and leg swelling.  Gastrointestinal: Negative for abdominal pain, diarrhea, nausea and vomiting.  Genitourinary: Negative for difficulty urinating and dysuria.  Musculoskeletal: Negative for joint swelling and myalgias.  Skin: Negative for color change and rash.  Neurological: Negative for dizziness, light-headedness and headaches.  Psychiatric/Behavioral: Negative for agitation and dysphoric mood.       Objective:    Physical Exam  Constitutional: She appears well-developed and well-nourished. No distress.  HENT:  Nose: Nose normal.  Mouth/Throat: Oropharynx is clear and moist.  Eyes:  No erythema.   Neck: Neck supple. No thyromegaly present.  Cardiovascular: Normal rate and regular rhythm.   Pulmonary/Chest: Breath sounds normal. No respiratory distress. She has no wheezes.  Abdominal: Soft. Bowel sounds are normal. There is no tenderness.  Musculoskeletal: She exhibits no edema or tenderness.  Lymphadenopathy:    She has no cervical adenopathy.    Skin: No rash noted. No erythema.  Psychiatric: She has a normal mood and affect. Her behavior is normal.    BP 126/74 (BP Location: Left Arm, Patient Position: Sitting, Cuff Size: Large)   Pulse 71   Temp 98.6 F (37 C) (Oral)   Resp 18   Ht 5\' 5"  (1.651 m)   Wt 251 lb 3.2 oz (113.9 kg)   SpO2 98%   BMI 41.80 kg/m  Wt Readings from Last 3 Encounters:  10/18/16 251 lb 3.2 oz (113.9 kg)  08/12/16 252 lb 6.4 oz (114.5 kg)  07/19/16 243 lb (110.2 kg)     Lab Results  Component Value Date   WBC 8.7 08/19/2016   HGB 13.0 08/19/2016   HCT  38.9 08/19/2016   PLT 467.0 (H) 08/19/2016   GLUCOSE 106 (H) 08/19/2016   CHOL 212 (H) 08/19/2016   TRIG 160.0 (H) 08/19/2016   HDL 51.60 08/19/2016   LDLDIRECT 172.8 09/17/2013   LDLCALC 128 (H) 08/19/2016   ALT 18 08/19/2016   AST 19 08/19/2016   NA 143 08/19/2016   K 4.5 08/19/2016   CL 108 08/19/2016   CREATININE 1.07 08/19/2016   BUN 10 08/19/2016   CO2 29 08/19/2016   TSH 1.82 08/19/2016   INR 0.9 07/02/2013   HGBA1C 6.1 08/19/2016   MICROALBUR 1.1 04/28/2015    US Breast Ltd Uni Right Inc Axilla  Result Date: 08/19/2016 CLINICAL DATA:  Screening recall for a possible right breast mass. EXAM: 2D DIGITAL DIAGNOSTIC UNILATERAL RIGHT MAMMOGRAM WITH CAD AND ADJUNCT TOMO RIGHT BREAST ULTRASOUND COMPARISON:  Previous exam(s). ACR Breast Density Category c: The breast tissue is heterogeneously dense, which may obscure small masses. FINDINGS: In the lower outer quadrant of the right breast, posterior depth there is a 1.2 cm oval mass with indistinct margins. Mammographic images were processed with CAD. Ultrasound of the right breast at 9 o'clock, 4 cm from the nipple demonstrates a circumscribed near anechoic oval mass measuring 7 x 3 x 5 mm. Closer to the nipple at 9 o'clock 2 cm from the nipple there is an anechoic circumscribed oval mass measuring 8 x 4 x 7 mm. IMPRESSION: The mass in the lower outer right breast is probably benign,  likely representing a mildly complicated cyst. RECOMMENDATION: Six-month follow-up right breast mammogram and ultrasound is recommended. I have discussed the findings and recommendations with the patient. Results were also provided in writing at the conclusion of the visit. If applicable, a reminder letter will be sent to the patient regarding the next appointment. BI-RADS CATEGORY  3: Probably benign. Electronically Signed   By: Ammie Ferrier M.D.   On: 08/19/2016 14:23   Mm Diag Breast Tomo Uni Right  Result Date: 08/19/2016 CLINICAL DATA:  Screening recall for a possible right breast mass. EXAM: 2D DIGITAL DIAGNOSTIC UNILATERAL RIGHT MAMMOGRAM WITH CAD AND ADJUNCT TOMO RIGHT BREAST ULTRASOUND COMPARISON:  Previous exam(s). ACR Breast Density Category c: The breast tissue is heterogeneously dense, which may obscure small masses. FINDINGS: In the lower outer quadrant of the right breast, posterior depth there is a 1.2 cm oval mass with indistinct margins. Mammographic images were processed with CAD. Ultrasound of the right breast at 9 o'clock, 4 cm from the nipple demonstrates a circumscribed near anechoic oval mass measuring 7 x 3 x 5 mm. Closer to the nipple at 9 o'clock 2 cm from the nipple there is an anechoic circumscribed oval mass measuring 8 x 4 x 7 mm. IMPRESSION: The mass in the lower outer right breast is probably benign, likely representing a mildly complicated cyst. RECOMMENDATION: Six-month follow-up right breast mammogram and ultrasound is recommended. I have discussed the findings and recommendations with the patient. Results were also provided in writing at the conclusion of the visit. If applicable, a reminder letter will be sent to the patient regarding the next appointment. BI-RADS CATEGORY  3: Probably benign. Electronically Signed   By: Ammie Ferrier M.D.   On: 08/19/2016 14:23       Assessment & Plan:   Problem List Items Addressed This Visit    Anemia    Has seen Dr  Ma Hillock.  Follow cbc.       Relevant Orders   CBC with Differential/Platelet  Dry eyes    Problems with dry eyes.  Are watering as well.  Have opthalmology evaluate given persistent.        GERD (gastroesophageal reflux disease)    Saw GI.  Medications adjusted as outlined.  Follow       Hypercholesterolemia    Low cholesterol diet and exercise.  On crestor.  Follow lipid panel and liver function tests.        Relevant Orders   Hepatic function panel   Lipid panel   Hyperglycemia    Low carb diet and exercise.  Check metabolic panel and [id number].        Relevant Orders   Hemoglobin A1c   Hypertension    Blood pressure under good control.  Continue same medication regimen.  Follow pressures.  Follow metabolic panel.        Relevant Orders   Basic metabolic panel   Lichen sclerosus    Seeing gyn.  Using clobetasol.        Severe obesity (BMI >= 40) (HCC)    Diet and exercise.  Follow.       Stress    She has cut back on her work.  Should help.  Continues on effexor.  Follow.       Thrombocytosis ([place])    Follow cbc.           Einar Pheasant, MD

## 2016-10-24 ENCOUNTER — Encounter: Payer: Self-pay | Admitting: Internal Medicine

## 2016-10-24 DIAGNOSIS — H04123 Dry eye syndrome of bilateral lacrimal glands: Secondary | ICD-10-CM | POA: Insufficient documentation

## 2016-10-24 NOTE — Assessment & Plan Note (Signed)
She has cut back on her work.  Should help.  Continues on effexor.  Follow.

## 2016-10-24 NOTE — Assessment & Plan Note (Signed)
Seeing gyn.  Using clobetasol.

## 2016-10-24 NOTE — Assessment & Plan Note (Signed)
Has seen Dr Ma Hillock.  Follow cbc.

## 2016-10-24 NOTE — Assessment & Plan Note (Signed)
Diet and exercise.  Follow.  

## 2016-10-24 NOTE — Assessment & Plan Note (Signed)
Low cholesterol diet and exercise.  On crestor.  Follow lipid panel and liver function tests.  

## 2016-10-24 NOTE — Assessment & Plan Note (Signed)
Follow cbc.  

## 2016-10-24 NOTE — Assessment & Plan Note (Signed)
Blood pressure under good control.  Continue same medication regimen.  Follow pressures.  Follow metabolic panel.   

## 2016-10-24 NOTE — Assessment & Plan Note (Signed)
Problems with dry eyes.  Are watering as well.  Have opthalmology evaluate given persistent.

## 2016-10-24 NOTE — Assessment & Plan Note (Signed)
Saw GI.  Medications adjusted as outlined.  Follow

## 2016-10-24 NOTE — Assessment & Plan Note (Signed)
Low carb diet and exercise.  Check metabolic panel and a1c.   

## 2016-10-25 ENCOUNTER — Other Ambulatory Visit: Payer: Self-pay | Admitting: Internal Medicine

## 2016-11-16 ENCOUNTER — Ambulatory Visit (INDEPENDENT_AMBULATORY_CARE_PROVIDER_SITE_OTHER): Payer: BLUE CROSS/BLUE SHIELD | Admitting: Family

## 2016-11-16 VITALS — BP 156/80 | HR 83 | Temp 98.4°F | Ht 65.0 in

## 2016-11-16 DIAGNOSIS — J020 Streptococcal pharyngitis: Secondary | ICD-10-CM | POA: Diagnosis not present

## 2016-11-16 LAB — POCT RAPID STREP A (OFFICE): Rapid Strep A Screen: POSITIVE — AB

## 2016-11-16 MED ORDER — AZITHROMYCIN 250 MG PO TABS: ORAL_TABLET | ORAL | 0 refills | Status: DC

## 2016-11-16 MED ORDER — LIDOCAINE VISCOUS 2 % MT SOLN: 15.0000 mL | OROMUCOSAL | 0 refills | Status: DC | PRN

## 2016-11-16 NOTE — Patient Instructions (Addendum)
tyenolol for pain  Let me know if not better   Strep Throat Strep throat is a bacterial infection of the throat. Your health care provider may call the infection tonsillitis or pharyngitis, depending on whether there is swelling in the tonsils or at the back of the throat. Strep throat is most common during the cold months of the year in children who are 1-67 years of age, but it can happen during any season in people of any age. This infection is spread from person to person (contagious) through coughing, sneezing, or close contact. What are the causes? Strep throat is caused by the bacteria called Streptococcus pyogenes. What increases the risk? This condition is more likely to develop in:  People who spend time in crowded places where the infection can spread easily.  People who have close contact with someone who has strep throat. What are the signs or symptoms? Symptoms of this condition include:  Fever or chills.  Redness, swelling, or pain in the tonsils or throat.  Pain or difficulty when swallowing.  White or yellow spots on the tonsils or throat.  Swollen, tender glands in the neck or under the jaw.  Red rash all over the body (rare). How is this diagnosed? This condition is diagnosed by performing a rapid strep test or by taking a swab of your throat (throat culture test). Results from a rapid strep test are usually ready in a few minutes, but throat culture test results are available after one or two days. How is this treated? This condition is treated with antibiotic medicine. Follow these instructions at home: Medicines   Take over-the-counter and prescription medicines only as told by your health care provider.  Take your antibiotic as told by your health care provider. Do not stop taking the antibiotic even if you start to feel better.  Have family members who also have a sore throat or fever tested for strep throat. They may need antibiotics if they have the  strep infection. Eating and drinking   Do not share food, drinking cups, or personal items that could cause the infection to spread to other people.  If swallowing is difficult, try eating soft foods until your sore throat feels better.  Drink enough fluid to keep your urine clear or pale yellow. General instructions   Gargle with a salt-water mixture 3-4 times per day or as needed. To make a salt-water mixture, completely dissolve -1 tsp of salt in 1 cup of warm water.  Make sure that all household members wash their hands well.  Get plenty of rest.  Stay home from school or work until you have been taking antibiotics for 24 hours.  Keep all follow-up visits as told by your health care provider. This is important. Contact a health care provider if:  The glands in your neck continue to get bigger.  You develop a rash, cough, or earache.  You cough up a thick liquid that is green, yellow-brown, or bloody.  You have pain or discomfort that does not get better with medicine.  Your problems seem to be getting worse rather than better.  You have a fever. Get help right away if:  You have new symptoms, such as vomiting, severe headache, stiff or painful neck, chest pain, or shortness of breath.  You have severe throat pain, drooling, or changes in your voice.  You have swelling of the neck, or the skin on the neck becomes red and tender.  You have signs of dehydration, such  as fatigue, dry mouth, and decreased urination.  You become increasingly sleepy, or you cannot wake up completely.  Your joints become red or painful. This information is not intended to replace advice given to you by your health care provider. Make sure you discuss any questions you have with your health care provider. Document Released: 07/30/2000 Document Revised: 03/31/2016 Document Reviewed: 11/25/2014 Elsevier Interactive Patient Education  2017 [employer].

## 2016-11-16 NOTE — Progress Notes (Signed)
Subjective:    Patient ID: Nicole Sims, female    DOB: 1951/02/24, 66 y.o.   MRN: [id number]  CC: Nicole Sims is a 66 y.o. female who presents today for an acute visit.    HPI: CC: sore throat x 4 days. Endorses post nasal drip, chills.    Tyelonol helps some. Hurts to swallow. Has been taking flonase , no real difference.   No cough, wheezing , SOB, fever.   Former smoker      HISTORY:  Past Medical History:  Diagnosis Date  . Anemia   . Anxiety   . Asthma   . Chronic headaches   . Diverticulosis   . Environmental allergies   . GERD (gastroesophageal reflux disease)   . Hypercholesterolemia   . Hyperglycemia   . Hypertension   . Osteoarthritis   . Palpitations   . Thrombocytosis ([place])   . Urinary incontinence    mixed   Past Surgical History:  Procedure Laterality Date  . BREAST CYST ASPIRATION Bilateral 2005   approximate year  . CERVICAL CONE BIOPSY     CIS  . CHOLECYSTECTOMY    . COLONOSCOPY WITH PROPOFOL N/A 07/19/2016   Procedure: COLONOSCOPY WITH PROPOFOL;  Surgeon: Manya Silvas, MD;  Location: [facility] ENDOSCOPY;  Service: Endoscopy;  Laterality: N/A;  . ESOPHAGOGASTRODUODENOSCOPY (EGD) WITH PROPOFOL N/A 07/19/2016   Procedure: ESOPHAGOGASTRODUODENOSCOPY (EGD) WITH PROPOFOL;  Surgeon: Manya Silvas, MD;  Location: [facility] ENDOSCOPY;  Service: Endoscopy;  Laterality: N/A;  . KNEE ARTHROSCOPY  08/13/08  . knee replacement and revision     left  . ROTATOR CUFF REPAIR     bilateral  . SHOULDER SURGERY  11/17/05  . TONSILLECTOMY  1962  . VAGINAL HYSTERECTOMY  1974   abnormal pap and carcinoma in situ   Family History  Problem Relation Age of Onset  . Diabetes Mellitus II Father   . Thyroid disease Father   . Breast cancer Maternal Aunt   . Colon cancer Neg Hx     Allergies: Augmentin [amoxicillin-pot clavulanate]; Bactrim [sulfamethoxazole-trimethoprim]; Doxycycline; Hydrocodone-acetaminophen; Pseudoephedrine; and Relafen [nabumetone] Current  Outpatient Prescriptions on File Prior to Visit  Medication Sig Dispense Refill  . albuterol (PROVENTIL HFA;VENTOLIN HFA) 108 (90 BASE) MCG/ACT inhaler Inhale 2 puffs into the lungs every 4 (four) hours as needed. 1 Inhaler 3  . albuterol (PROVENTIL HFA;VENTOLIN HFA) 108 (90 Base) MCG/ACT inhaler Inhale 2 puffs into the lungs every 6 (six) hours as needed for wheezing or shortness of breath. 1 Inhaler 2  . aspirin EC 81 MG tablet Take 81 mg by mouth daily.    . cetirizine (ZYRTEC) 10 MG tablet Take 10 mg by mouth daily.    . clobetasol cream (TEMOVATE) 6.70 % Apply 1 application topically 2 (two) times daily.    . Fluticasone-Salmeterol (ADVAIR DISKUS) 250-50 MCG/DOSE AEPB Inhale 1 puff into the lungs 2 (two) times daily. 60 each 2  . gabapentin (NEURONTIN) 100 MG capsule Take 100 mg by mouth 3 (three) times daily.    . metoprolol succinate (TOPROL-XL) 50 MG 24 hr tablet TAKE 1 TABLET BY MOUTH EVERY DAY WITH OR IMMEDIATELY FOLLOWING A MEAL. 30 tablet 5  . Multiple Vitamin (MULTIVITAMIN) tablet Take 1 tablet by mouth daily.    . ondansetron (ZOFRAN) 4 MG tablet Take 1 tablet (4 mg total) by mouth 2 (two) times daily as needed for nausea or vomiting. 20 tablet 0  . Probiotic Product (PROBIOTIC DAILY PO) Take by mouth.    Marland Kitchen  RABEprazole (ACIPHEX) 20 MG tablet Take 20 mg by mouth 2 (two) times daily.    . rosuvastatin (CRESTOR) 5 MG tablet TAKE 1 TABLET BY MOUTH EVERY DAY 30 tablet 2  . sucralfate (CARAFATE) 1 g tablet Take 1 g by mouth 3 (three) times daily.    Marland Kitchen venlafaxine XR (EFFEXOR-XR) 150 MG 24 hr capsule TAKE 1 CAPSULE BY MOUTH EVERY DAY WITH BREAKFAST 30 capsule 0  . VITAMIN E PO Take 1 tablet by mouth daily.     No current facility-administered medications on file prior to visit.     Social History  Substance Use Topics  . Smoking status: Former Smoker    Quit date: 08/17/1983  . Smokeless tobacco: Never Used  . Alcohol use 0.0 oz/week     Comment: rarely    Review of Systems    Constitutional: Positive for chills. Negative for fever.  HENT: Positive for sore throat and trouble swallowing. Negative for congestion and sinus pressure.   Respiratory: Negative for cough, shortness of breath and wheezing.   Cardiovascular: Negative for chest pain and palpitations.  Gastrointestinal: Negative for nausea and vomiting.      Objective:    BP (!) 156/80   Pulse 83   Temp 98.4 F (36.9 C) (Oral)   Ht 5\' 5"  (1.651 m)   SpO2 98%    Physical Exam  Constitutional: She appears well-developed and well-nourished.  HENT:  Head: Normocephalic and atraumatic.  Right Ear: Hearing, tympanic membrane, external ear and ear canal normal. No drainage, swelling or tenderness. No foreign bodies. Tympanic membrane is not erythematous and not bulging. No middle ear effusion. No decreased hearing is noted.  Left Ear: Hearing, tympanic membrane, external ear and ear canal normal. No drainage, swelling or tenderness. No foreign bodies. Tympanic membrane is not erythematous and not bulging.  No middle ear effusion. No decreased hearing is noted.  Nose: Nose normal. No rhinorrhea. Right sinus exhibits no maxillary sinus tenderness and no frontal sinus tenderness. Left sinus exhibits no maxillary sinus tenderness and no frontal sinus tenderness.  Mouth/Throat: Uvula is midline and mucous membranes are normal. Posterior oropharyngeal erythema present. No oropharyngeal exudate, posterior oropharyngeal edema or tonsillar abscesses.  Eyes: Conjunctivae are normal.  Cardiovascular: Regular rhythm, normal heart sounds and normal pulses.   Pulmonary/Chest: Effort normal and breath sounds normal. She has no wheezes. She has no rhonchi. She has no rales.  Lymphadenopathy:       Head (right side): Submandibular adenopathy present. No submental, no tonsillar, no preauricular, no posterior auricular and no occipital adenopathy present.       Head (left side): Submandibular adenopathy present. No submental,  no tonsillar, no preauricular, no posterior auricular and no occipital adenopathy present.    She has no cervical adenopathy.  Tender, symmetrically enlarged submandibular nodes  Neurological: She is alert.  Skin: Skin is warm and dry.  Psychiatric: She has a normal mood and affect. Her speech is normal and behavior is normal. Thought content normal.  Vitals reviewed.      Assessment & Plan:  1. Strep pharyngitis Strep positive. Due to allergies, will treat with azithromycin, return precautions given.   - lidocaine (XYLOCAINE) 2 % solution; Use as directed 15 mLs in the mouth or throat every 3 (three) hours as needed for mouth pain (gargle; may spit or swallow).  Dispense: 100 mL; Refill: 0 - azithromycin (ZITHROMAX) 250 MG tablet; Tale 500 mg PO on day 1, then 250 mg PO q24h x 4 days.  Dispense: 6 tablet; Refill: 0 - POCT rapid strep A     I am having Nicole Sims start on lidocaine and azithromycin. I am also having her maintain her multivitamin, albuterol, cetirizine, Fluticasone-Salmeterol, Probiotic Product (PROBIOTIC DAILY PO), VITAMIN E PO, aspirin EC, albuterol, gabapentin, ondansetron, RABEprazole, metoprolol succinate, clobetasol cream, sucralfate, rosuvastatin, and venlafaxine XR.   Meds ordered this encounter  Medications  . lidocaine (XYLOCAINE) 2 % solution    Sig: Use as directed 15 mLs in the mouth or throat every 3 (three) hours as needed for mouth pain (gargle; may spit or swallow).    Dispense:  100 mL    Refill:  0    Order Specific Question:   Supervising Provider    Answer:   Deborra Medina L [[id number]]  . azithromycin (ZITHROMAX) 250 MG tablet    Sig: Tale 500 mg PO on day 1, then 250 mg PO q24h x 4 days.    Dispense:  6 tablet    Refill:  0    Order Specific Question:   Supervising Provider    Answer:   Crecencio Mc [[id number]]    Return precautions given.   Risks, benefits, and alternatives of the medications and treatment plan prescribed today were discussed,  and patient expressed understanding.   Education regarding symptom management and diagnosis given to patient on AVS.  Continue to follow with Einar Pheasant, MD for routine health maintenance.   Mliss Sax Cala and I agreed with plan.   Mable Paris, FNP

## 2016-11-17 NOTE — Progress Notes (Signed)
Pre visit review using our clinic review tool, if applicable. No additional management support is needed unless otherwise documented below in the visit note. 

## 2016-11-22 NOTE — Progress Notes (Signed)
Left message for patient to return call back.  

## 2016-11-26 NOTE — Progress Notes (Signed)
Closing note due to no return call back. 

## 2016-11-29 ENCOUNTER — Other Ambulatory Visit: Payer: Self-pay | Admitting: Internal Medicine

## 2016-12-24 ENCOUNTER — Other Ambulatory Visit (INDEPENDENT_AMBULATORY_CARE_PROVIDER_SITE_OTHER): Payer: BLUE CROSS/BLUE SHIELD

## 2016-12-24 DIAGNOSIS — D649 Anemia, unspecified: Secondary | ICD-10-CM

## 2016-12-24 DIAGNOSIS — I10 Essential (primary) hypertension: Secondary | ICD-10-CM | POA: Diagnosis not present

## 2016-12-24 DIAGNOSIS — E7800 Pure hypercholesterolemia, unspecified: Secondary | ICD-10-CM

## 2016-12-24 DIAGNOSIS — R739 Hyperglycemia, unspecified: Secondary | ICD-10-CM | POA: Diagnosis not present

## 2016-12-24 LAB — CBC WITH DIFFERENTIAL/PLATELET
BASOS PCT: 0.9 % (ref 0.0–3.0)
Basophils Absolute: 0.1 10*3/uL (ref 0.0–0.1)
EOS ABS: 0.2 10*3/uL (ref 0.0–0.7)
Eosinophils Relative: 2.2 % (ref 0.0–5.0)
HEMATOCRIT: 39.1 % (ref 36.0–46.0)
HEMOGLOBIN: 12.7 g/dL (ref 12.0–15.0)
LYMPHS PCT: 38.9 % (ref 12.0–46.0)
Lymphs Abs: 3.5 10*3/uL (ref 0.7–4.0)
MCHC: 32.4 g/dL (ref 30.0–36.0)
MCV: 84.8 fl (ref 78.0–100.0)
MONOS PCT: 6.5 % (ref 3.0–12.0)
Monocytes Absolute: 0.6 10*3/uL (ref 0.1–1.0)
NEUTROS ABS: 4.6 10*3/uL (ref 1.4–7.7)
Neutrophils Relative %: 51.5 % (ref 43.0–77.0)
PLATELETS: 456 10*3/uL — AB (ref 150.0–400.0)
RBC: 4.61 Mil/uL (ref 3.87–5.11)
RDW: 14.5 % (ref 11.5–15.5)
WBC: 9 10*3/uL (ref 4.0–10.5)

## 2016-12-24 LAB — LIPID PANEL
CHOLESTEROL: 202 mg/dL — AB (ref 0–200)
HDL: 59 mg/dL (ref >39.00)
LDL Cholesterol: 116 mg/dL — ABNORMAL HIGH (ref 0–99)
NonHDL: 143.43
TRIGLYCERIDES: 136 mg/dL (ref 0.0–149.0)
Total CHOL/HDL Ratio: 3
VLDL: 27.2 mg/dL (ref 0.0–40.0)

## 2016-12-24 LAB — HEPATIC FUNCTION PANEL
ALBUMIN: 4.2 g/dL (ref 3.5–5.2)
ALT: 15 U/L (ref 0–35)
AST: 16 U/L (ref 0–37)
Alkaline Phosphatase: 87 U/L (ref 39–117)
Bilirubin, Direct: 0 mg/dL (ref 0.0–0.3)
Total Bilirubin: 0.4 mg/dL (ref 0.2–1.2)
Total Protein: 8 g/dL (ref 6.0–8.3)

## 2016-12-24 LAB — BASIC METABOLIC PANEL
BUN: 13 mg/dL (ref 6–23)
CALCIUM: 9.5 mg/dL (ref 8.4–10.5)
CO2: 28 meq/L (ref 19–32)
Chloride: 106 mEq/L (ref 96–112)
Creatinine, Ser: 0.98 mg/dL (ref 0.40–1.20)
GFR: 60.22 mL/min (ref >60.00)
GLUCOSE: 103 mg/dL — AB (ref 70–99)
Potassium: 4.7 mEq/L (ref 3.5–5.1)
SODIUM: 140 meq/L (ref 135–145)

## 2016-12-24 LAB — HEMOGLOBIN A1C: HEMOGLOBIN A1C: 6.1 % (ref 4.6–6.5)

## 2016-12-25 ENCOUNTER — Encounter: Payer: Self-pay | Admitting: Internal Medicine

## 2016-12-27 ENCOUNTER — Other Ambulatory Visit: Payer: Self-pay | Admitting: Internal Medicine

## 2016-12-28 ENCOUNTER — Encounter: Payer: Self-pay | Admitting: Internal Medicine

## 2016-12-28 ENCOUNTER — Telehealth: Payer: Self-pay

## 2016-12-28 ENCOUNTER — Ambulatory Visit (INDEPENDENT_AMBULATORY_CARE_PROVIDER_SITE_OTHER): Payer: BLUE CROSS/BLUE SHIELD | Admitting: Internal Medicine

## 2016-12-28 VITALS — BP 144/78 | HR 86 | Temp 98.6°F | Resp 12 | Ht 65.0 in | Wt 250.0 lb

## 2016-12-28 DIAGNOSIS — F439 Reaction to severe stress, unspecified: Secondary | ICD-10-CM | POA: Diagnosis not present

## 2016-12-28 DIAGNOSIS — M79604 Pain in right leg: Secondary | ICD-10-CM

## 2016-12-28 DIAGNOSIS — R739 Hyperglycemia, unspecified: Secondary | ICD-10-CM | POA: Diagnosis not present

## 2016-12-28 DIAGNOSIS — M79605 Pain in left leg: Secondary | ICD-10-CM | POA: Diagnosis not present

## 2016-12-28 DIAGNOSIS — E7800 Pure hypercholesterolemia, unspecified: Secondary | ICD-10-CM

## 2016-12-28 DIAGNOSIS — D75839 Thrombocytosis, unspecified: Secondary | ICD-10-CM

## 2016-12-28 DIAGNOSIS — K219 Gastro-esophageal reflux disease without esophagitis: Secondary | ICD-10-CM | POA: Diagnosis not present

## 2016-12-28 DIAGNOSIS — D473 Essential (hemorrhagic) thrombocythemia: Secondary | ICD-10-CM | POA: Diagnosis not present

## 2016-12-28 DIAGNOSIS — I10 Essential (primary) hypertension: Secondary | ICD-10-CM

## 2016-12-28 DIAGNOSIS — N6310 Unspecified lump in the right breast, unspecified quadrant: Secondary | ICD-10-CM

## 2016-12-28 NOTE — Progress Notes (Signed)
Patient ID: Nicole Sims, female   DOB: 1951/02/18, 66 y.o.   MRN: [id number]   Subjective:    Patient ID: Nicole Sims, female    DOB: Jun 28, 1951, 66 y.o.   MRN: [id number]  HPI  Patient here for a scheduled follow up.  She reports increased stress with some family issues.  Discussed with her today.  She desires no further intervention.  No chest pain.  Breathing stable.  No acid reflux.  No abdominal pain.  Bowels moving.  She does report noticing electrical currents in her legs.  Has been present for 3-4 weeks.  No other weakness or pain.  Discussed lab results.     Past Medical History:  Diagnosis Date  . Anemia   . Anxiety   . Asthma   . Chronic headaches   . Diverticulosis   . Environmental allergies   . GERD (gastroesophageal reflux disease)   . Hypercholesterolemia   . Hyperglycemia   . Hypertension   . Osteoarthritis   . Palpitations   . Thrombocytosis ([place])   . Urinary incontinence    mixed   Past Surgical History:  Procedure Laterality Date  . BREAST CYST ASPIRATION Bilateral 2005   approximate year  . CERVICAL CONE BIOPSY     CIS  . CHOLECYSTECTOMY    . COLONOSCOPY WITH PROPOFOL N/A 07/19/2016   Procedure: COLONOSCOPY WITH PROPOFOL;  Surgeon: Manya Silvas, MD;  Location: [facility] ENDOSCOPY;  Service: Endoscopy;  Laterality: N/A;  . ESOPHAGOGASTRODUODENOSCOPY (EGD) WITH PROPOFOL N/A 07/19/2016   Procedure: ESOPHAGOGASTRODUODENOSCOPY (EGD) WITH PROPOFOL;  Surgeon: Manya Silvas, MD;  Location: [facility] ENDOSCOPY;  Service: Endoscopy;  Laterality: N/A;  . KNEE ARTHROSCOPY  08/13/08  . knee replacement and revision     left  . ROTATOR CUFF REPAIR     bilateral  . SHOULDER SURGERY  11/17/05  . TONSILLECTOMY  1962  . VAGINAL HYSTERECTOMY  1974   abnormal pap and carcinoma in situ   Family History  Problem Relation Age of Onset  . Diabetes Mellitus II Father   . Thyroid disease Father   . Breast cancer Maternal Aunt   . Colon cancer Neg Hx    Social History    Social History  . Marital status: Married    Spouse name: N/A  . Number of children: N/A  . Years of education: N/A   Social History Main Topics  . Smoking status: Former Smoker    Quit date: 08/17/1983  . Smokeless tobacco: Never Used  . Alcohol use 0.0 oz/week     Comment: rarely  . Drug use: No  . Sexual activity: Not Asked   Other Topics Concern  . None   Social History Narrative  . None    Outpatient Encounter Prescriptions as of 12/28/2016  Medication Sig  . albuterol (PROVENTIL HFA;VENTOLIN HFA) 108 (90 BASE) MCG/ACT inhaler Inhale 2 puffs into the lungs every 4 (four) hours as needed.  Marland Kitchen aspirin EC 81 MG tablet Take 81 mg by mouth daily.  . cetirizine (ZYRTEC) 10 MG tablet Take 10 mg by mouth daily.  . clobetasol cream (TEMOVATE) 2.95 % Apply 1 application topically 2 (two) times daily.  . Fluticasone-Salmeterol (ADVAIR DISKUS) 250-50 MCG/DOSE AEPB Inhale 1 puff into the lungs 2 (two) times daily.  . metoprolol succinate (TOPROL-XL) 50 MG 24 hr tablet TAKE 1 TABLET BY MOUTH EVERY DAY WITH OR IMMEDIATELY FOLLOWING A MEAL.  . Multiple Vitamin (MULTIVITAMIN) tablet Take 1 tablet by mouth daily.  Marland Kitchen  ondansetron (ZOFRAN) 4 MG tablet Take 1 tablet (4 mg total) by mouth 2 (two) times daily as needed for nausea or vomiting.  . RABEprazole (ACIPHEX) 20 MG tablet Take 20 mg by mouth 2 (two) times daily.  . rosuvastatin (CRESTOR) 5 MG tablet TAKE 1 TABLET BY MOUTH EVERY DAY  . sucralfate (CARAFATE) 1 g tablet Take 1 g by mouth 3 (three) times daily.  Marland Kitchen venlafaxine XR (EFFEXOR-XR) 150 MG 24 hr capsule TAKE 1 CAPSULE BY MOUTH EVERY DAY WITH BREAKFAST  . [DISCONTINUED] Probiotic Product (PROBIOTIC DAILY PO) Take by mouth.  . [DISCONTINUED] VITAMIN E PO Take 1 tablet by mouth daily.  . [DISCONTINUED] albuterol (PROVENTIL HFA;VENTOLIN HFA) 108 (90 Base) MCG/ACT inhaler Inhale 2 puffs into the lungs every 6 (six) hours as needed for wheezing or shortness of breath.  . [DISCONTINUED]  azithromycin (ZITHROMAX) 250 MG tablet Tale 500 mg PO on day 1, then 250 mg PO q24h x 4 days.  . [DISCONTINUED] gabapentin (NEURONTIN) 100 MG capsule Take 100 mg by mouth 3 (three) times daily.  . [DISCONTINUED] lidocaine (XYLOCAINE) 2 % solution Use as directed 15 mLs in the mouth or throat every 3 (three) hours as needed for mouth pain (gargle; may spit or swallow).   No facility-administered encounter medications on file as of 12/28/2016.     Review of Systems  Constitutional: Negative for appetite change and unexpected weight change.  HENT: Negative for congestion and sinus pressure.   Respiratory: Negative for cough, chest tightness and shortness of breath.   Cardiovascular: Negative for chest pain, palpitations and leg swelling.  Gastrointestinal: Negative for abdominal pain, diarrhea, nausea and vomiting.  Genitourinary: Negative for difficulty urinating and dysuria.  Musculoskeletal: Negative for joint swelling.       Electrical current - feels through her legs.    Skin: Negative for color change and rash.  Neurological: Negative for dizziness, light-headedness and headaches.  Psychiatric/Behavioral: Negative for agitation and dysphoric mood.       Increased stress as outlined.         Objective:    Physical Exam  Constitutional: She appears well-developed and well-nourished. No distress.  HENT:  Nose: Nose normal.  Mouth/Throat: Oropharynx is clear and moist.  Neck: Neck supple. No thyromegaly present.  Cardiovascular: Normal rate and regular rhythm.   Lower extremity pulses palpable and equal bilaterally.    Pulmonary/Chest: Breath sounds normal. No respiratory distress. She has no wheezes.  Abdominal: Soft. Bowel sounds are normal. There is no tenderness.  Musculoskeletal: She exhibits no edema or tenderness.  Lymphadenopathy:    She has no cervical adenopathy.  Skin: No rash noted. No erythema.  Psychiatric: She has a normal mood and affect. Her behavior is normal.      BP (!) 144/78 (BP Location: Left Arm, Patient Position: Sitting, Cuff Size: Normal)   Pulse 86   Temp 98.6 F (37 C) (Oral)   Resp 12   Ht '5\' 5"'$  (1.651 m)   Wt 250 lb (113.4 kg)   SpO2 98%   BMI 41.60 kg/m  Wt Readings from Last 3 Encounters:  12/28/16 250 lb (113.4 kg)  10/18/16 251 lb 3.2 oz (113.9 kg)  08/12/16 252 lb 6.4 oz (114.5 kg)     Lab Results  Component Value Date   WBC 9.0 12/24/2016   HGB 12.7 12/24/2016   HCT 39.1 12/24/2016   PLT 456.0 (H) 12/24/2016   GLUCOSE 103 (H) 12/24/2016   CHOL 202 (H) 12/24/2016   TRIG  136.0 12/24/2016   HDL 59.00 12/24/2016   LDLDIRECT 172.8 09/17/2013   LDLCALC 116 (H) 12/24/2016   ALT 15 12/24/2016   AST 16 12/24/2016   NA 140 12/24/2016   K 4.7 12/24/2016   CL 106 12/24/2016   CREATININE 0.98 12/24/2016   BUN 13 12/24/2016   CO2 28 12/24/2016   TSH 1.82 08/19/2016   INR 0.9 07/02/2013   HGBA1C 6.1 12/24/2016   MICROALBUR 1.1 04/28/2015    US Breast Ltd Uni Right Inc Axilla  Result Date: 08/19/2016 CLINICAL DATA:  Screening recall for a possible right breast mass. EXAM: 2D DIGITAL DIAGNOSTIC UNILATERAL RIGHT MAMMOGRAM WITH CAD AND ADJUNCT TOMO RIGHT BREAST ULTRASOUND COMPARISON:  Previous exam(s). ACR Breast Density Category c: The breast tissue is heterogeneously dense, which may obscure small masses. FINDINGS: In the lower outer quadrant of the right breast, posterior depth there is a 1.2 cm oval mass with indistinct margins. Mammographic images were processed with CAD. Ultrasound of the right breast at 9 o'clock, 4 cm from the nipple demonstrates a circumscribed near anechoic oval mass measuring 7 x 3 x 5 mm. Closer to the nipple at 9 o'clock 2 cm from the nipple there is an anechoic circumscribed oval mass measuring 8 x 4 x 7 mm. IMPRESSION: The mass in the lower outer right breast is probably benign, likely representing a mildly complicated cyst. RECOMMENDATION: Six-month follow-up right breast mammogram and  ultrasound is recommended. I have discussed the findings and recommendations with the patient. Results were also provided in writing at the conclusion of the visit. If applicable, a reminder letter will be sent to the patient regarding the next appointment. BI-RADS CATEGORY  3: Probably benign. Electronically Signed   By: Ammie Ferrier M.D.   On: 08/19/2016 14:23   Mm Diag Breast Tomo Uni Right  Result Date: 08/19/2016 CLINICAL DATA:  Screening recall for a possible right breast mass. EXAM: 2D DIGITAL DIAGNOSTIC UNILATERAL RIGHT MAMMOGRAM WITH CAD AND ADJUNCT TOMO RIGHT BREAST ULTRASOUND COMPARISON:  Previous exam(s). ACR Breast Density Category c: The breast tissue is heterogeneously dense, which may obscure small masses. FINDINGS: In the lower outer quadrant of the right breast, posterior depth there is a 1.2 cm oval mass with indistinct margins. Mammographic images were processed with CAD. Ultrasound of the right breast at 9 o'clock, 4 cm from the nipple demonstrates a circumscribed near anechoic oval mass measuring 7 x 3 x 5 mm. Closer to the nipple at 9 o'clock 2 cm from the nipple there is an anechoic circumscribed oval mass measuring 8 x 4 x 7 mm. IMPRESSION: The mass in the lower outer right breast is probably benign, likely representing a mildly complicated cyst. RECOMMENDATION: Six-month follow-up right breast mammogram and ultrasound is recommended. I have discussed the findings and recommendations with the patient. Results were also provided in writing at the conclusion of the visit. If applicable, a reminder letter will be sent to the patient regarding the next appointment. BI-RADS CATEGORY  3: Probably benign. Electronically Signed   By: Ammie Ferrier M.D.   On: 08/19/2016 14:23       Assessment & Plan:   Problem List Items Addressed This Visit    GERD (gastroesophageal reflux disease)    Has seen GI.  Currently doing well on current medication regimen.  Follow.         Hypercholesterolemia    On crestor.  Low cholesterol diet and exercise.  Follow lipid panel and liver function tests.  Hyperglycemia    Low carb diet and exercise.  Follow met b and a1c.        Hypertension    Blood pressure has been under good control.  Recheck improved.  Continue same medication regimen.  Follow pressures.  Follow metabolic panel.        Leg pain, bilateral    Describes persistent problems with feeling electrical currents in her legs.  No reproducible pain, etc on exam.  Discussed with her today.  She request referral to neurology for further evaluation.        Relevant Orders   Ambulatory referral to Neurology   Severe obesity (BMI >= 40) (HCC)    Diet and exercise.  Follow.        Stress    Increased stress as outlined.  Discussed with her today.  She desires no further intervention.  Follow.        Thrombocytosis ([place])    Follow cbc. Has been stable.         Other Visit Diagnoses    Breast mass, right    -  Primary   Relevant Orders   MM DIAG BREAST TOMO UNI RIGHT       Einar Pheasant, MD

## 2016-12-28 NOTE — Telephone Encounter (Signed)
Parking placard received for renewal put in red folder for your signature.

## 2016-12-28 NOTE — Progress Notes (Signed)
Pre-visit discussion using our clinic review tool. No additional management support is needed unless otherwise documented below in the visit note.  

## 2016-12-29 NOTE — Telephone Encounter (Signed)
Called l/m for patient that we will put form in mail.

## 2016-12-29 NOTE — Telephone Encounter (Signed)
Signed and placed in box.   

## 2017-01-10 ENCOUNTER — Encounter: Payer: Self-pay | Admitting: Internal Medicine

## 2017-01-10 DIAGNOSIS — M79605 Pain in left leg: Secondary | ICD-10-CM | POA: Insufficient documentation

## 2017-01-10 DIAGNOSIS — M79604 Pain in right leg: Secondary | ICD-10-CM | POA: Insufficient documentation

## 2017-01-10 NOTE — Assessment & Plan Note (Signed)
Follow cbc.  Has been stable.  

## 2017-01-10 NOTE — Assessment & Plan Note (Signed)
On crestor.  Low cholesterol diet and exercise.  Follow lipid panel and liver function tests.   

## 2017-01-10 NOTE — Assessment & Plan Note (Signed)
Diet and exercise.  Follow.  

## 2017-01-10 NOTE — Assessment & Plan Note (Signed)
Low carb diet and exercise.  Follow met b and a1c.   

## 2017-01-10 NOTE — Assessment & Plan Note (Signed)
Increased stress as outlined.  Discussed with her today. She desires no further intervention.  Follow.   

## 2017-01-10 NOTE — Assessment & Plan Note (Signed)
Describes persistent problems with feeling electrical currents in her legs.  No reproducible pain, etc on exam.  Discussed with her today.  She request referral to neurology for further evaluation.

## 2017-01-10 NOTE — Assessment & Plan Note (Signed)
Blood pressure has been under good control.  Recheck improved.  Continue same medication regimen.  Follow pressures.  Follow metabolic panel.   

## 2017-01-10 NOTE — Assessment & Plan Note (Signed)
Has seen GI.  Currently doing well on current medication regimen.  Follow.

## 2017-01-11 ENCOUNTER — Ambulatory Visit (INDEPENDENT_AMBULATORY_CARE_PROVIDER_SITE_OTHER): Payer: BLUE CROSS/BLUE SHIELD

## 2017-01-11 ENCOUNTER — Encounter: Payer: Self-pay | Admitting: Family Medicine

## 2017-01-11 ENCOUNTER — Ambulatory Visit (INDEPENDENT_AMBULATORY_CARE_PROVIDER_SITE_OTHER): Payer: BLUE CROSS/BLUE SHIELD | Admitting: Family Medicine

## 2017-01-11 VITALS — BP 130/82 | HR 75 | Temp 98.4°F | Wt 245.8 lb

## 2017-01-11 DIAGNOSIS — R05 Cough: Secondary | ICD-10-CM

## 2017-01-11 DIAGNOSIS — R059 Cough, unspecified: Secondary | ICD-10-CM

## 2017-01-11 MED ORDER — ALBUTEROL SULFATE (2.5 MG/3ML) 0.083% IN NEBU: 2.5000 mg | INHALATION_SOLUTION | Freq: Four times a day (QID) | RESPIRATORY_TRACT | 1 refills | Status: DC | PRN

## 2017-01-11 MED ORDER — PREDNISONE 20 MG PO TABS: 40.0000 mg | ORAL_TABLET | Freq: Every day | ORAL | 0 refills | Status: DC

## 2017-01-11 NOTE — Patient Instructions (Addendum)
Nice to see you. You are likely having an asthma exacerbation. This may be related to a viral illness or bacterial illness. We are going to obtain a chest x-ray and will contact you with results. We will start you on prednisone as well. If your x-ray dictates that you need an antibiotic we'll place you on one. If you develop shortness of breath, cough productive of blood, persistent fevers, or any new or changing symptoms please seek medical attention immediately.

## 2017-01-11 NOTE — Assessment & Plan Note (Signed)
Patient with cough and chest congestion also with some sinus congestion. Suspect likely asthma exacerbation triggered either by allergies or viral illness though given fever we will obtain a chest x-ray to evaluate for any underlying pneumonia. We'll start her on prednisone. She'll use her albuterol nebulizer at home every 6 hours while awake. If indication for antibiotic arises on chest x-ray we will start on antibiotic. She'll monitor. Given return precautions.

## 2017-01-11 NOTE — Progress Notes (Signed)
  Tommi Rumps, MD Phone: [phone]  Nicole Sims is a 66 y.o. female who presents today for same-day visit.  Patient presents for cough. Started 3-4 days ago. Describes it as a hard cough. Productive of green mucus today. MAXIMUM TEMPERATURE of 100.71F. Some sinus congestion. Nose feels stopped up. No shortness of breath. Has noted some wheezing. Does have a history of asthma. Has tried Flonase and over-the-counter cough syrup. Tessalon has helped with her cough. She's not been using her albuterol inhaler.  PMH: Former smoker   ROS see history of present illness  Objective  Physical Exam Vitals:   01/11/17 1029  BP: 130/82  Pulse: 75  Temp: 98.4 F (36.9 C)    BP Readings from Last 3 Encounters:  01/11/17 130/82  12/28/16 (!) 144/78  11/17/16 (!) 156/80   Wt Readings from Last 3 Encounters:  01/11/17 245 lb 12.8 oz (111.5 kg)  12/28/16 250 lb (113.4 kg)  10/18/16 251 lb 3.2 oz (113.9 kg)    Physical Exam  Constitutional: No distress.  HENT:  Head: Normocephalic and atraumatic.  Mouth/Throat: Oropharynx is clear and moist. No oropharyngeal exudate.  Normal TMs  Eyes: Conjunctivae are normal. Pupils are equal, round, and reactive to light.  Neck: Neck supple.  Cardiovascular: Normal rate, regular rhythm and normal heart sounds.   Pulmonary/Chest: Effort normal. No respiratory distress. She has wheezes ( scattered expiratory wheezes, prolonged expiratory phase). She has no rales.  No crackles  Lymphadenopathy:    She has no cervical adenopathy.  Neurological: She is alert.  Skin: Skin is warm and dry. She is not diaphoretic.     Assessment/Plan: Please see individual problem list.  Cough Patient with cough and chest congestion also with some sinus congestion. Suspect likely asthma exacerbation triggered either by allergies or viral illness though given fever we will obtain a chest x-ray to evaluate for any underlying pneumonia. We'll start her on  prednisone. She'll use her albuterol nebulizer at home every 6 hours while awake. If indication for antibiotic arises on chest x-ray we will start on antibiotic. She'll monitor. Given return precautions.   Orders Placed This Encounter  Procedures  . DG Chest 2 View    Standing Status:   Future    Number of Occurrences:   1    Standing Expiration Date:   03/13/2018    Order Specific Question:   Reason for Exam (SYMPTOM  OR DIAGNOSIS REQUIRED)    Answer:   cough, fever, scattered wheezes    Order Specific Question:   Preferred imaging location?    Answer:   [employer] Specific Question:   Radiology Contrast Protocol - do NOT remove file path    Answer:   [email or web address]    Meds ordered this encounter  Medications  . albuterol (PROVENTIL) (2.5 MG/3ML) 0.083% nebulizer solution    Sig: Take 3 mLs (2.5 mg total) by nebulization every 6 (six) hours as needed for wheezing or shortness of breath.    Dispense:  150 mL    Refill:  1  . predniSONE (DELTASONE) 20 MG tablet    Sig: Take 2 tablets (40 mg total) by mouth daily with breakfast.    Dispense:  10 tablet    Refill:  0   Tommi Rumps, MD [place]

## 2017-01-25 ENCOUNTER — Other Ambulatory Visit: Payer: Self-pay | Admitting: Internal Medicine

## 2017-02-19 ENCOUNTER — Other Ambulatory Visit: Payer: Self-pay | Admitting: Internal Medicine

## 2017-02-28 ENCOUNTER — Ambulatory Visit
Admission: RE | Admit: 2017-02-28 | Discharge: 2017-02-28 | Disposition: A | Discharge status: Home / Self Care | Payer: BLUE CROSS/BLUE SHIELD | Source: Ambulatory Visit | Attending: Internal Medicine | Admitting: Internal Medicine

## 2017-02-28 DIAGNOSIS — N6310 Unspecified lump in the right breast, unspecified quadrant: Secondary | ICD-10-CM | POA: Diagnosis present

## 2017-02-28 DIAGNOSIS — R928 Other abnormal and inconclusive findings on diagnostic imaging of breast: Secondary | ICD-10-CM

## 2017-03-02 ENCOUNTER — Encounter: Payer: Self-pay | Admitting: *Deleted

## 2017-03-22 ENCOUNTER — Other Ambulatory Visit: Payer: Self-pay | Admitting: Internal Medicine

## 2017-03-30 ENCOUNTER — Ambulatory Visit (INDEPENDENT_AMBULATORY_CARE_PROVIDER_SITE_OTHER): Payer: BLUE CROSS/BLUE SHIELD | Admitting: Internal Medicine

## 2017-03-30 ENCOUNTER — Encounter: Payer: Self-pay | Admitting: Internal Medicine

## 2017-03-30 VITALS — BP 126/80 | HR 73 | Temp 98.7°F | Resp 14 | Ht 65.0 in | Wt 244.2 lb

## 2017-03-30 DIAGNOSIS — Z0000 Encounter for general adult medical examination without abnormal findings: Secondary | ICD-10-CM | POA: Diagnosis not present

## 2017-03-30 DIAGNOSIS — R519 Headache, unspecified: Secondary | ICD-10-CM

## 2017-03-30 DIAGNOSIS — D473 Essential (hemorrhagic) thrombocythemia: Secondary | ICD-10-CM

## 2017-03-30 DIAGNOSIS — E7800 Pure hypercholesterolemia, unspecified: Secondary | ICD-10-CM | POA: Diagnosis not present

## 2017-03-30 DIAGNOSIS — I10 Essential (primary) hypertension: Secondary | ICD-10-CM | POA: Diagnosis not present

## 2017-03-30 DIAGNOSIS — E559 Vitamin D deficiency, unspecified: Secondary | ICD-10-CM | POA: Diagnosis not present

## 2017-03-30 DIAGNOSIS — K219 Gastro-esophageal reflux disease without esophagitis: Secondary | ICD-10-CM | POA: Diagnosis not present

## 2017-03-30 DIAGNOSIS — D75839 Thrombocytosis, unspecified: Secondary | ICD-10-CM

## 2017-03-30 DIAGNOSIS — R739 Hyperglycemia, unspecified: Secondary | ICD-10-CM

## 2017-03-30 DIAGNOSIS — L900 Lichen sclerosus et atrophicus: Secondary | ICD-10-CM

## 2017-03-30 DIAGNOSIS — F439 Reaction to severe stress, unspecified: Secondary | ICD-10-CM | POA: Diagnosis not present

## 2017-03-30 DIAGNOSIS — R51 Headache: Secondary | ICD-10-CM

## 2017-03-30 DIAGNOSIS — M2550 Pain in unspecified joint: Secondary | ICD-10-CM | POA: Diagnosis not present

## 2017-03-30 MED ORDER — ONDANSETRON HCL 4 MG PO TABS: 4.0000 mg | ORAL_TABLET | Freq: Two times a day (BID) | ORAL | 0 refills | Status: DC | PRN

## 2017-03-30 NOTE — Progress Notes (Signed)
03/31/2017 10:Sims AM   Nicole Sims Nicole Sims, Nicole Sims [id number]  Referring provider: Einar Pheasant, MD [address] [place], [place] [address]  Chief Complaint  Patient presents with  . New Patient (Initial Visit)    urinary frequency referred by Glenis Smoker    HPI: Patient is a 66 year old Caucasian female who is referred by Marolyn Hammock, CNM for frequency of urination.  Patient has been having issues with her bladder for several years.  She has undergo bladder surgeries with Dr. Bernardo Heater and Dr. Davis Gourd.  She has also tired a pessary and could not tolerate it.    She is having associated urinary frequency, urgency, nocturia, intermittency, hesitancy and weak urinary stream.   She feels like when she goes to urinate, she is not getting it all out.  She sits on the toilet for a few minutes to get more urine out.  She feels like something is coming out, either her vagina or bladder.  She is also experiencing this during her BM's.  This has been worsening for the last few months.    She does suffer with severe constipation.  She does not have a history of urinary tract infections, STI's or injury to the bladder.   She denies dysuria, gross hematuria, suprapubic pain, back pain, abdominal pain or flank pain.  She has not had any recent fevers, chills, nausea or vomiting.   She does not have a history of nephrolithiasis or GU trauma.   She is not sexually active.  She is post menopausal.   She is not having pain with bladder filling.    Contrast CT in 10/2015 noted no explanation for patient's intermittent right mid to lower abdominal pain. Specifically, no evidence of enteric or urinary obstruction.  Unchanged asymmetric atrophy involving the left kidney, similar to the 02/2009 examination, nonspecific though potentially the sequela of prior infectious (favored) or ischemic etiology.  Reviewed referral notes.    PMH: Past Medical History:  Diagnosis  Date  . Anemia   . Anxiety   . Asthma   . Chronic headaches   . Diverticulosis   . Environmental allergies   . GERD (gastroesophageal reflux disease)   . Hypercholesterolemia   . Hyperglycemia   . Hypertension   . Osteoarthritis   . Palpitations   . Thrombocytosis ([place])   . Urinary incontinence    mixed    Surgical History: Past Surgical History:  Procedure Laterality Date  . BLADDER SURGERY     x2   [facility]  . BREAST CYST ASPIRATION Bilateral 2005   approximate year  . CERVICAL CONE BIOPSY     CIS  . CHOLECYSTECTOMY    . COLONOSCOPY WITH PROPOFOL N/A 07/19/2016   Procedure: COLONOSCOPY WITH PROPOFOL;  Surgeon: Manya Silvas, MD;  Location: [facility] ENDOSCOPY;  Service: Endoscopy;  Laterality: N/A;  . ESOPHAGOGASTRODUODENOSCOPY (EGD) WITH PROPOFOL N/A 07/19/2016   Procedure: ESOPHAGOGASTRODUODENOSCOPY (EGD) WITH PROPOFOL;  Surgeon: Manya Silvas, MD;  Location: [facility] ENDOSCOPY;  Service: Endoscopy;  Laterality: N/A;  . KNEE ARTHROSCOPY  08/13/08  . knee replacement and revision     left  . RECTOCELE REPAIR    . ROTATOR CUFF REPAIR     bilateral  . SHOULDER SURGERY  11/17/05  . TONSILLECTOMY  1962  . VAGINAL HYSTERECTOMY  1974   abnormal pap and carcinoma in situ    Home Medications:  Allergies as of 03/31/2017      Reactions   Augmentin [  amoxicillin-pot Clavulanate] Other (See Comments)   Questionable itching   Bactrim [sulfamethoxazole-trimethoprim]    Doxycycline Itching   Hydrocodone-acetaminophen Other (See Comments)   GI distress   Pseudoephedrine    Itching of the scalp   Relafen [nabumetone] Other (See Comments)   Itching/rash      Medication List       Accurate as of 03/31/17 10:Sims AM. Always use your most recent med list.          albuterol 108 (90 Base) MCG/ACT inhaler Commonly known as:  PROVENTIL HFA;VENTOLIN HFA Inhale 2 puffs into the lungs every 4 (four) hours as needed.   albuterol (2.5 MG/3ML) 0.083% nebulizer  solution Commonly known as:  PROVENTIL Take 3 mLs (2.5 mg total) by nebulization every 6 (six) hours as needed for wheezing or shortness of breath.   aspirin EC 81 MG tablet Take 81 mg by mouth daily.   cetirizine 10 MG tablet Commonly known as:  ZYRTEC Take 10 mg by mouth daily.   clobetasol cream 0.05 % Commonly known as:  TEMOVATE Apply 1 application topically 2 (two) times daily.   Fluticasone-Salmeterol 250-50 MCG/DOSE Aepb Commonly known as:  ADVAIR DISKUS Inhale 1 puff into the lungs 2 (two) times daily.   metoprolol succinate 50 MG 24 hr tablet Commonly known as:  TOPROL-XL TAKE 1 TABLET BY MOUTH EVERY DAY OR IMMEDIATELY FOLLOWING A MEAL   multivitamin tablet Take 1 tablet by mouth daily.   ondansetron 4 MG tablet Commonly known as:  ZOFRAN Take 1 tablet (4 mg total) by mouth 2 (two) times daily as needed for nausea or vomiting.   RABEprazole 20 MG tablet Commonly known as:  ACIPHEX Take 20 mg by mouth 2 (two) times daily.   rOPINIRole 0.5 MG tablet Commonly known as:  REQUIP Take 0.25mg  (1/2 tablet) nightly for one week then 0.5mg  (1 tablet) nightly   rosuvastatin 5 MG tablet Commonly known as:  CRESTOR TAKE 1 TABLET BY MOUTH EVERY DAY   sucralfate 1 g tablet Commonly known as:  CARAFATE Take 1 g by mouth 3 (three) times daily.   tamsulosin 0.4 MG Caps capsule Commonly known as:  FLOMAX Take 1 capsule (0.4 mg total) by mouth daily.   venlafaxine XR 150 MG 24 hr capsule Commonly known as:  EFFEXOR-XR TAKE 1 CAPSULE BY MOUTH EVERY DAY WITH BREAKFAST   Vitamin D-3 1000 units Caps Take by mouth.       Allergies:  Allergies  Allergen Reactions  . Augmentin [Amoxicillin-Pot Clavulanate] Other (See Comments)    Questionable itching  . Bactrim [Sulfamethoxazole-Trimethoprim]   . Doxycycline Itching  . Hydrocodone-Acetaminophen Other (See Comments)    GI distress  . Pseudoephedrine     Itching of the scalp  . Relafen [Nabumetone] Other (See  Comments)    Itching/rash    Family History: Family History  Problem Relation Age of Onset  . Diabetes Mellitus II Father   . Thyroid disease Father   . Breast cancer Maternal Aunt   . Colon cancer Neg Hx   . Kidney cancer Neg Hx   . Bladder Cancer Neg Hx     Social History:  reports that she quit smoking about 33 years ago. She has never used smokeless tobacco. She reports that she drinks alcohol. She reports that she does not use drugs.  ROS: UROLOGY Frequent Urination?: Yes Hard to postpone urination?: Yes Burning/pain with urination?: No Get up at night to urinate?: Yes Leakage of urine?: No Urine stream starts  and stops?: Yes Trouble starting stream?: Yes Do you have to strain to urinate?: No Blood in urine?: No Urinary tract infection?: No Sexually transmitted disease?: No Injury to kidneys or bladder?: No Painful intercourse?: No Weak stream?: Yes Currently pregnant?: No Vaginal bleeding?: No Last menstrual period?: n  Gastrointestinal Nausea?: Yes Vomiting?: No Indigestion/heartburn?: Yes Diarrhea?: No Constipation?: Yes  Constitutional Fever: No Night sweats?: No Weight loss?: Yes Fatigue?: Yes  Skin Skin rash/lesions?: Yes Itching?: Yes  Eyes Blurred vision?: No Double vision?: No  Ears/Nose/Throat Sore throat?: Yes Sinus problems?: Yes  Hematologic/Lymphatic Swollen glands?: No Easy bruising?: No  Cardiovascular Leg swelling?: No Chest pain?: No  Respiratory Cough?: No Shortness of breath?: No  Endocrine Excessive thirst?: No  Musculoskeletal Back pain?: Yes Joint pain?: Yes  Neurological Headaches?: Yes Dizziness?: Yes  Psychologic Depression?: No Anxiety?: Yes  Physical Exam: BP 125/78   Pulse 73   Ht 5\' 5"  (1.651 m)   Wt 244 lb 14.4 oz (111.1 kg)   BMI 40.75 kg/m   Constitutional: Well nourished. Alert and oriented, No acute distress. HEENT: [place] AT, moist mucus membranes. Trachea midline, no  masses. Cardiovascular: No clubbing, cyanosis, or edema. Respiratory: Normal respiratory effort, no increased work of breathing. GI: Abdomen is soft, non tender, non distended, no abdominal masses. Liver and spleen not palpable.  No hernias appreciated.  Stool sample for occult testing is not indicated.   GU: No CVA tenderness.  No bladder fullness or masses.  Atrophic external genitalia, normal pubic hair distribution, no lesions.  Normal urethral meatus, no lesions, no prolapse, no discharge.   No urethral masses, tenderness and/or tenderness. No bladder fullness, tenderness or masses. Pale vagina mucosa, poor estrogen effect, no discharge, no lesions, good pelvic support, no cystocele or rectocele noted.  Cervix and uterus are surgically absent.  No adnexal/parametria masses or tenderness noted.  Anus and perineum are without rashes or lesions.   Tenderness was noted in the pelvis bilaterally.   Skin: No rashes, bruises or suspicious lesions. Lymph: No cervical or inguinal adenopathy. Neurologic: Grossly intact, no focal deficits, moving all 4 extremities. Psychiatric: Normal mood and affect.  Laboratory Data: Lab Results  Component Value Date   WBC 9.0 12/24/2016   HGB 12.7 12/24/2016   HCT 39.1 12/24/2016   MCV 84.8 12/24/2016   PLT 456.0 (H) 12/24/2016    Lab Results  Component Value Date   CREATININE 0.98 12/24/2016    Lab Results  Component Value Date   HGBA1C 6.1 12/24/2016    Lab Results  Component Value Date   TSH 1.82 08/19/2016       Component Value Date/Time   CHOL 202 (H) 12/24/2016 0839   HDL 59.00 12/24/2016 0839   CHOLHDL 3 12/24/2016 0839   VLDL 27.2 12/24/2016 0839   LDLCALC 116 (H) 12/24/2016 0839    Lab Results  Component Value Date   AST 16 12/24/2016   Lab Results  Component Value Date   ALT Sims 12/24/2016     I have reviewed the labs.   Pertinent Imaging: Results for KYNDEL, EGGER (MRN [id number]) as of 03/31/2017 10:05  Ref. Range  03/31/2017 09:28  Scan Result Unknown 60   I have independently reviewed the films.    Assessment & Plan:    1. Feeling of incomplete bladder emptying  - offered behavioral therapies, bladder training and bladder control strategies  - pelvic floor muscle training  - fluid management   - give a trial of tamuslosin 0.4  mg daily - We will start tamsulosin.  she is advised to take it 30 minutes after a meal.  I advised him of the side effects, such as: sinus congestion, nasal congestion, rhinorrhea, rhinitis, dizziness, and seasonal allergic rhinitis.   - RTC in one month for PVR and symptom recheck   2. Vaginal atrophy  - Patient was given a sample of vaginal estrogen cream (Premarin) and instructed to apply 0.5mg  (pea-sized amount)  just inside the vaginal introitus with a finger-tip every night for two weeks and then Monday, Wednesday and Friday nights.  I explained to the patient that vaginally administered estrogen, which causes only a slight increase in the blood estrogen levels, have fewer contraindications and adverse systemic effects that oral HT.  - She will follow up in three months for an exam.    3. Pelvic pain  - pain elicited on exam  - will obtain a CT for further evaluation    Return in about 1 month (around 05/01/2017) for PVR and OAB questionnaire.  These notes generated with voice recognition software. I apologize for typographical errors.  Zara Council, [facility] [address] [place], [place] [address] [phone]

## 2017-03-30 NOTE — Progress Notes (Signed)
Patient ID: Nicole Sims, female   DOB: 09-Aug-1951, 66 y.o.   MRN: [id number]   Subjective:    Patient ID: Nicole Sims, female    DOB: [birth year], 66 y.o.   MRN: [id number]  HPI  Patient here for her physical exam.  She recently saw neurology - Dr Manuella Ghazi.  Evaluated for paresthesias, restless leg syndrome and f/u for headaches.  Note reviewed.  Found to have vitamin D deficiency.  Started on supplements.  Planning for NCS.  Started on requip.  Does feel this is helping some of her leg symptoms.  Saw gyn.  Has problems with urinary frequency.  Found to have slight cystocele and vaginal laxity.  Did not tolerate pessary fitting.  Referred to urology.  No chest pain.  Breathing stable.  On aciphex.   Does report some nausea when eating.  No abdominal pain.  Has lost some weight.  Bowels stable.  Some increased aching - joints/muscles.     Past Medical History:  Diagnosis Date  . Anemia   . Anxiety   . Asthma   . Chronic headaches   . Diverticulosis   . Environmental allergies   . GERD (gastroesophageal reflux disease)   . Hypercholesterolemia   . Hyperglycemia   . Hypertension   . Osteoarthritis   . Palpitations   . Thrombocytosis ([place])   . Urinary incontinence    mixed   Past Surgical History:  Procedure Laterality Date  . BLADDER SURGERY     x2   [facility]  . BREAST CYST ASPIRATION Bilateral 2005   approximate year  . CERVICAL CONE BIOPSY     CIS  . CHOLECYSTECTOMY    . COLONOSCOPY WITH PROPOFOL N/A 07/19/2016   Procedure: COLONOSCOPY WITH PROPOFOL;  Surgeon: Manya Silvas, MD;  Location: [facility] ENDOSCOPY;  Service: Endoscopy;  Laterality: N/A;  . ESOPHAGOGASTRODUODENOSCOPY (EGD) WITH PROPOFOL N/A 07/19/2016   Procedure: ESOPHAGOGASTRODUODENOSCOPY (EGD) WITH PROPOFOL;  Surgeon: Manya Silvas, MD;  Location: [facility] ENDOSCOPY;  Service: Endoscopy;  Laterality: N/A;  . KNEE ARTHROSCOPY  08/13/08  . knee replacement and revision     left  . RECTOCELE REPAIR    .  ROTATOR CUFF REPAIR     bilateral  . SHOULDER SURGERY  11/17/05  . TONSILLECTOMY  1962  . VAGINAL HYSTERECTOMY  1974   abnormal pap and carcinoma in situ   Family History  Problem Relation Age of Onset  . Diabetes Mellitus II Father   . Thyroid disease Father   . Breast cancer Maternal Aunt   . Colon cancer Neg Hx   . Kidney cancer Neg Hx   . Bladder Cancer Neg Hx    Social History   Social History  . Marital status: Married    Spouse name: N/A  . Number of children: N/A  . Years of education: N/A   Social History Main Topics  . Smoking status: Former Smoker    Quit date: 08/17/1983  . Smokeless tobacco: Never Used  . Alcohol use 0.0 oz/week     Comment: rarely  . Drug use: No  . Sexual activity: Not Asked   Other Topics Concern  . None   Social History Narrative  . None    Outpatient Encounter Prescriptions as of 03/30/2017  Medication Sig  . albuterol (PROVENTIL HFA;VENTOLIN HFA) 108 (90 BASE) MCG/ACT inhaler Inhale 2 puffs into the lungs every 4 (four) hours as needed.  Marland Kitchen albuterol (PROVENTIL) (2.5 MG/3ML) 0.083% nebulizer solution Take 3 mLs (  2.5 mg total) by nebulization every 6 (six) hours as needed for wheezing or shortness of breath.  Marland Kitchen aspirin EC 81 MG tablet Take 81 mg by mouth daily.  . cetirizine (ZYRTEC) 10 MG tablet Take 10 mg by mouth daily.  . clobetasol cream (TEMOVATE) 8.54 % Apply 1 application topically 2 (two) times daily.  . Fluticasone-Salmeterol (ADVAIR DISKUS) 250-50 MCG/DOSE AEPB Inhale 1 puff into the lungs 2 (two) times daily.  . metoprolol succinate (TOPROL-XL) 50 MG 24 hr tablet TAKE 1 TABLET BY MOUTH EVERY DAY OR IMMEDIATELY FOLLOWING A MEAL  . Multiple Vitamin (MULTIVITAMIN) tablet Take 1 tablet by mouth daily.  . ondansetron (ZOFRAN) 4 MG tablet Take 1 tablet (4 mg total) by mouth 2 (two) times daily as needed for nausea or vomiting.  . RABEprazole (ACIPHEX) 20 MG tablet Take 20 mg by mouth 2 (two) times daily.  Marland Kitchen rOPINIRole (REQUIP) 0.5  MG tablet Take 0.'25mg'$  (1/2 tablet) nightly for one week then 0.'5mg'$  (1 tablet) nightly  . rosuvastatin (CRESTOR) 5 MG tablet TAKE 1 TABLET BY MOUTH EVERY DAY  . sucralfate (CARAFATE) 1 g tablet Take 1 g by mouth 3 (three) times daily.  Marland Kitchen venlafaxine XR (EFFEXOR-XR) 150 MG 24 hr capsule TAKE 1 CAPSULE BY MOUTH EVERY DAY WITH BREAKFAST  . [DISCONTINUED] ondansetron (ZOFRAN) 4 MG tablet Take 1 tablet (4 mg total) by mouth 2 (two) times daily as needed for nausea or vomiting.  . [DISCONTINUED] predniSONE (DELTASONE) 20 MG tablet Take 2 tablets (40 mg total) by mouth daily with breakfast.  . [DISCONTINUED] rosuvastatin (CRESTOR) 5 MG tablet TAKE 1 TABLET BY MOUTH EVERY DAY  . [DISCONTINUED] rosuvastatin (CRESTOR) 5 MG tablet TAKE 1 TABLET BY MOUTH EVERY DAY   No facility-administered encounter medications on file as of 03/30/2017.     Review of Systems  Constitutional: Negative for fever.       Some weight loss.   HENT: Negative for congestion and sinus pressure.   Eyes: Negative for pain and visual disturbance.  Respiratory: Negative for cough and chest tightness.        Breathing stable.    Cardiovascular: Negative for chest pain and leg swelling.  Gastrointestinal: Positive for nausea. Negative for abdominal pain, diarrhea and vomiting.  Genitourinary: Negative for difficulty urinating and dysuria.  Musculoskeletal: Negative for joint swelling.       Increased muscle and joint aches.    Skin: Negative for color change and rash.  Neurological: Negative for dizziness, light-headedness and headaches.  Hematological: Negative for adenopathy. Does not bruise/bleed easily.  Psychiatric/Behavioral: Negative for agitation and dysphoric mood.       Increased stress.        Objective:    Physical Exam  Constitutional: She is oriented to person, place, and time. She appears well-developed and well-nourished. No distress.  HENT:  Nose: Nose normal.  Mouth/Throat: Oropharynx is clear and moist.    Eyes: Right eye exhibits no discharge. Left eye exhibits no discharge. No scleral icterus.  Neck: Neck supple. No thyromegaly present.  Cardiovascular: Normal rate and regular rhythm.   Pulmonary/Chest: Breath sounds normal. No accessory muscle usage. No tachypnea. No respiratory distress. She has no decreased breath sounds. She has no wheezes. She has no rhonchi. Right breast exhibits no inverted nipple, no mass, no nipple discharge and no tenderness (no axillary adenopathy). Left breast exhibits no inverted nipple, no mass, no nipple discharge and no tenderness (no axilarry adenopathy).  Abdominal: Soft. Bowel sounds are normal. There is  no tenderness.  Musculoskeletal: She exhibits no edema or tenderness.  Lymphadenopathy:    She has no cervical adenopathy.  Neurological: She is alert and oriented to person, place, and time.  Skin: Skin is warm. No rash noted. No erythema.  Psychiatric: She has a normal mood and affect. Her behavior is normal.    BP 126/80 (BP Location: Left Arm, Patient Position: Sitting, Cuff Size: Normal)   Pulse 73   Temp 98.7 F (37.1 C) (Oral)   Resp 14   Ht '5\' 5"'$  (1.651 m)   Wt 244 lb 3.2 oz (110.8 kg)   SpO2 98%   BMI 40.64 kg/m  Wt Readings from Last 3 Encounters:  03/31/17 244 lb 14.4 oz (111.1 kg)  03/30/17 244 lb 3.2 oz (110.8 kg)  01/11/17 245 lb 12.8 oz (111.5 kg)     Lab Results  Component Value Date   WBC 9.0 12/24/2016   HGB 12.7 12/24/2016   HCT 39.1 12/24/2016   PLT 456.0 (H) 12/24/2016   GLUCOSE 103 (H) 12/24/2016   CHOL 202 (H) 12/24/2016   TRIG 136.0 12/24/2016   HDL 59.00 12/24/2016   LDLDIRECT 172.8 09/17/2013   LDLCALC 116 (H) 12/24/2016   ALT 15 12/24/2016   AST 16 12/24/2016   NA 140 12/24/2016   K 4.7 12/24/2016   CL 106 12/24/2016   CREATININE 0.98 12/24/2016   BUN 13 12/24/2016   CO2 28 12/24/2016   TSH 1.82 08/19/2016   INR 0.9 07/02/2013   HGBA1C 6.1 12/24/2016   MICROALBUR 1.1 04/28/2015    US Breast Ltd  Uni Right Inc Axilla  Result Date: 02/28/2017 CLINICAL DATA:  Right breast follow-up EXAM: 2D DIGITAL DIAGNOSTIC RIGHT MAMMOGRAM WITH CAD AND ADJUNCT TOMO ULTRASOUND RIGHT BREAST COMPARISON:  Previous exam(s). ACR Breast Density Category c: The breast tissue is heterogeneously dense, which may obscure small masses. FINDINGS: The previously noted oval mass in the posterior lateral right breast is not significantly changed from the patient's December 2017 mammogram. No new or suspicious abnormality is identified within the right breast. Mammographic images were processed with CAD. Targeted ultrasound of the right breast was performed demonstrating a similar-appearing oval, circumscribed, near anechoic mass at 9 o'clock, 2 cm from the nipple measuring 8 x 7 x 3 mm. At 9 o'clock, 4 cm from the nipple, there is an oval, circumscribed, near anechoic mass measuring 7 x 3 x 5 mm, also not significantly changed from prior exam. IMPRESSION: Probably benign right breast findings. RECOMMENDATION: Bilateral diagnostic mammogram and right breast ultrasound in December 2000 18. I have discussed the findings and recommendations with the patient. Results were also provided in writing at the conclusion of the visit. If applicable, a reminder letter will be sent to the patient regarding the next appointment. BI-RADS CATEGORY  3: Probably benign. Electronically Signed   By: Pamelia Hoit M.D.   On: 02/28/2017 17:53   Mm Diag Breast Tomo Uni Right  Result Date: 02/28/2017 CLINICAL DATA:  Right breast follow-up EXAM: 2D DIGITAL DIAGNOSTIC RIGHT MAMMOGRAM WITH CAD AND ADJUNCT TOMO ULTRASOUND RIGHT BREAST COMPARISON:  Previous exam(s). ACR Breast Density Category c: The breast tissue is heterogeneously dense, which may obscure small masses. FINDINGS: The previously noted oval mass in the posterior lateral right breast is not significantly changed from the patient's December 2017 mammogram. No new or suspicious abnormality is identified  within the right breast. Mammographic images were processed with CAD. Targeted ultrasound of the right breast was performed demonstrating a similar-appearing oval,  circumscribed, near anechoic mass at 9 o'clock, 2 cm from the nipple measuring 8 x 7 x 3 mm. At 9 o'clock, 4 cm from the nipple, there is an oval, circumscribed, near anechoic mass measuring 7 x 3 x 5 mm, also not significantly changed from prior exam. IMPRESSION: Probably benign right breast findings. RECOMMENDATION: Bilateral diagnostic mammogram and right breast ultrasound in December 2000 18. I have discussed the findings and recommendations with the patient. Results were also provided in writing at the conclusion of the visit. If applicable, a reminder letter will be sent to the patient regarding the next appointment. BI-RADS CATEGORY  3: Probably benign. Electronically Signed   By: Pamelia Hoit M.D.   On: 02/28/2017 17:53       Assessment & Plan:   Problem List Items Addressed This Visit    Chronic headaches    Followed by neurology.       GERD (gastroesophageal reflux disease)    Has previously seen GI.  EGD 07/19/16 - gastroesophagitis.  With persistent nausea with meals.  On aciphex.  Still with issues.  Does not feel food is moving through well.  Will refer back to GI for evaluation.       Relevant Medications   ondansetron (ZOFRAN) 4 MG tablet   Other Relevant Orders   Ambulatory referral to Gastroenterology   Health care maintenance    Physical today 03/30/17.  PAP 12/10/14.  Mammogram as outlined.  Due f/u mammogram 07/2017.  Colonoscopy 07/19/16 - hyperplastic polyp.       Hypercholesterolemia    On crestor.  Low cholesterol diet and exercise.  Follow lipid panel and liver function tests.        Relevant Orders   Hepatic function panel   Lipid panel   Hyperglycemia    Low carb diet and exercise.  Follow met b and a1c.       Relevant Orders   Hemoglobin A1c   Hypertension    Blood pressure under good control.   Continue same medication regimen.  Follow pressures.  Follow metabolic panel.        Relevant Orders   Basic metabolic panel   Lichen sclerosus    Has been followed by gyn.       Severe obesity (BMI >= 40) (HCC)    Diet and exercise.  Follow.       Stress    Increased stress. Discussed with her today.  Does not feel she needs any further intervention at this time.  Follow.       Thrombocytosis ([place])    Follow cbc.        Relevant Orders   CBC with Differential/Platelet   Vitamin D deficiency    Continue vitamin D supplements.         Other Visit Diagnoses    Arthralgia, unspecified joint    -  Primary   joint pain and muscle aches as outlined.  check esr and rheum panel.     Relevant Orders   Sedimentation rate   Rheumatoid factor   ANA   CK (Creatine Kinase)   Routine general medical examination at a health care facility           Einar Pheasant, MD

## 2017-03-30 NOTE — Progress Notes (Signed)
Pre-visit discussion using our clinic review tool. No additional management support is needed unless otherwise documented below in the visit note.  

## 2017-03-30 NOTE — Assessment & Plan Note (Addendum)
Physical today 03/30/17.  PAP 12/10/14.  Mammogram as outlined.  Due f/u mammogram 07/2017.  Colonoscopy 07/19/16 - hyperplastic polyp.

## 2017-03-31 ENCOUNTER — Ambulatory Visit (INDEPENDENT_AMBULATORY_CARE_PROVIDER_SITE_OTHER): Payer: BLUE CROSS/BLUE SHIELD | Admitting: Urology

## 2017-03-31 ENCOUNTER — Telehealth: Payer: Self-pay | Admitting: Urology

## 2017-03-31 ENCOUNTER — Encounter: Payer: Self-pay | Admitting: Urology

## 2017-03-31 VITALS — BP 125/78 | HR 73 | Ht 65.0 in | Wt 244.9 lb

## 2017-03-31 DIAGNOSIS — N952 Postmenopausal atrophic vaginitis: Secondary | ICD-10-CM | POA: Diagnosis not present

## 2017-03-31 DIAGNOSIS — R102 Pelvic and perineal pain: Secondary | ICD-10-CM

## 2017-03-31 DIAGNOSIS — R350 Frequency of micturition: Secondary | ICD-10-CM | POA: Diagnosis not present

## 2017-03-31 DIAGNOSIS — R3914 Feeling of incomplete bladder emptying: Secondary | ICD-10-CM | POA: Diagnosis not present

## 2017-03-31 LAB — BLADDER SCAN AMB NON-IMAGING: SCAN RESULT: 60

## 2017-03-31 MED ORDER — TAMSULOSIN HCL 0.4 MG PO CAPS: 0.4000 mg | ORAL_CAPSULE | Freq: Every day | ORAL | 3 refills | Status: DC

## 2017-03-31 NOTE — Telephone Encounter (Signed)
Patient's insurance will not approve her CT scan please advise  Thanks, Sharyn Lull

## 2017-04-01 ENCOUNTER — Other Ambulatory Visit: Payer: Self-pay | Admitting: Internal Medicine

## 2017-04-01 NOTE — Telephone Encounter (Signed)
Please let Nicole Sims know that her insurance will not cover her CT.  She needs to take the tamsulosin and we will reassess when she returns for follow up.

## 2017-04-01 NOTE — Telephone Encounter (Signed)
LMOM

## 2017-04-02 ENCOUNTER — Encounter: Payer: Self-pay | Admitting: Internal Medicine

## 2017-04-02 DIAGNOSIS — E559 Vitamin D deficiency, unspecified: Secondary | ICD-10-CM | POA: Insufficient documentation

## 2017-04-02 NOTE — Assessment & Plan Note (Signed)
Low carb diet and exercise.  Follow met b and a1c.

## 2017-04-02 NOTE — Assessment & Plan Note (Signed)
Diet and exercise.  Follow.  

## 2017-04-02 NOTE — Assessment & Plan Note (Signed)
Follow cbc.  

## 2017-04-02 NOTE — Assessment & Plan Note (Signed)
Followed by neurology.   

## 2017-04-02 NOTE — Assessment & Plan Note (Signed)
Has been followed by gyn.   

## 2017-04-02 NOTE — Assessment & Plan Note (Signed)
On crestor.  Low cholesterol diet and exercise.  Follow lipid panel and liver function tests.   

## 2017-04-02 NOTE — Assessment & Plan Note (Signed)
Continue vitamin D supplements.  

## 2017-04-02 NOTE — Assessment & Plan Note (Signed)
Has previously seen GI.  EGD 07/19/16 - gastroesophagitis.  With persistent nausea with meals.  On aciphex.  Still with issues.  Does not feel food is moving through well.  Will refer back to GI for evaluation.

## 2017-04-02 NOTE — Assessment & Plan Note (Signed)
Blood pressure under good control.  Continue same medication regimen.  Follow pressures.  Follow metabolic panel.   

## 2017-04-02 NOTE — Assessment & Plan Note (Signed)
Increased stress.  Discussed with her today.  Does not feel she needs any further intervention at this time.  Follow.   

## 2017-04-04 ENCOUNTER — Encounter: Payer: BLUE CROSS/BLUE SHIELD | Admitting: Internal Medicine

## 2017-04-04 NOTE — Telephone Encounter (Signed)
Spoke with pt in reference to CT and follow up appt. Pt voiced understanding.

## 2017-04-05 ENCOUNTER — Other Ambulatory Visit (INDEPENDENT_AMBULATORY_CARE_PROVIDER_SITE_OTHER): Payer: BLUE CROSS/BLUE SHIELD

## 2017-04-05 ENCOUNTER — Encounter: Payer: BLUE CROSS/BLUE SHIELD | Admitting: Internal Medicine

## 2017-04-05 DIAGNOSIS — D75839 Thrombocytosis, unspecified: Secondary | ICD-10-CM

## 2017-04-05 DIAGNOSIS — D473 Essential (hemorrhagic) thrombocythemia: Secondary | ICD-10-CM

## 2017-04-05 DIAGNOSIS — M2550 Pain in unspecified joint: Secondary | ICD-10-CM | POA: Diagnosis not present

## 2017-04-05 DIAGNOSIS — R739 Hyperglycemia, unspecified: Secondary | ICD-10-CM

## 2017-04-05 DIAGNOSIS — I10 Essential (primary) hypertension: Secondary | ICD-10-CM

## 2017-04-05 DIAGNOSIS — E7800 Pure hypercholesterolemia, unspecified: Secondary | ICD-10-CM | POA: Diagnosis not present

## 2017-04-05 LAB — LIPID PANEL
CHOL/HDL RATIO: 3
CHOLESTEROL: 175 mg/dL (ref 0–200)
HDL: 56.6 mg/dL (ref >39.00)
LDL CALC: 96 mg/dL (ref 0–99)
NonHDL: 118.82
Triglycerides: 114 mg/dL (ref 0.0–149.0)
VLDL: 22.8 mg/dL (ref 0.0–40.0)

## 2017-04-05 LAB — BASIC METABOLIC PANEL
BUN: 14 mg/dL (ref 6–23)
CALCIUM: 9.6 mg/dL (ref 8.4–10.5)
CO2: 31 meq/L (ref 19–32)
CREATININE: 0.97 mg/dL (ref 0.40–1.20)
Chloride: 105 mEq/L (ref 96–112)
GFR: 60.89 mL/min (ref >60.00)
Glucose, Bld: 108 mg/dL — ABNORMAL HIGH (ref 70–99)
Potassium: 4.4 mEq/L (ref 3.5–5.1)
SODIUM: 140 meq/L (ref 135–145)

## 2017-04-05 LAB — HEPATIC FUNCTION PANEL
ALK PHOS: 85 U/L (ref 39–117)
ALT: 17 U/L (ref 0–35)
AST: 16 U/L (ref 0–37)
Albumin: 3.8 g/dL (ref 3.5–5.2)
BILIRUBIN DIRECT: 0.2 mg/dL (ref 0.0–0.3)
BILIRUBIN TOTAL: 0.5 mg/dL (ref 0.2–1.2)
Total Protein: 8.1 g/dL (ref 6.0–8.3)

## 2017-04-05 LAB — CBC WITH DIFFERENTIAL/PLATELET
BASOS ABS: 0.1 10*3/uL (ref 0.0–0.1)
Basophils Relative: 1.1 % (ref 0.0–3.0)
EOS PCT: 2.5 % (ref 0.0–5.0)
Eosinophils Absolute: 0.2 10*3/uL (ref 0.0–0.7)
HCT: 40.1 % (ref 36.0–46.0)
HEMOGLOBIN: 12.9 g/dL (ref 12.0–15.0)
Lymphocytes Relative: 33.1 % (ref 12.0–46.0)
Lymphs Abs: 3 10*3/uL (ref 0.7–4.0)
MCHC: 32.1 g/dL (ref 30.0–36.0)
MCV: 87.2 fl (ref 78.0–100.0)
MONO ABS: 0.6 10*3/uL (ref 0.1–1.0)
MONOS PCT: 6.4 % (ref 3.0–12.0)
NEUTROS PCT: 56.9 % (ref 43.0–77.0)
Neutro Abs: 5.1 10*3/uL (ref 1.4–7.7)
Platelets: 460 10*3/uL — ABNORMAL HIGH (ref 150.0–400.0)
RBC: 4.6 Mil/uL (ref 3.87–5.11)
RDW: 15.1 % (ref 11.5–15.5)
WBC: 9 10*3/uL (ref 4.0–10.5)

## 2017-04-05 LAB — SEDIMENTATION RATE: Sed Rate: 70 mm/hr — ABNORMAL HIGH (ref 0–30)

## 2017-04-05 LAB — HEMOGLOBIN A1C: HEMOGLOBIN A1C: 6 % (ref 4.6–6.5)

## 2017-04-05 LAB — CK: CK TOTAL: 42 U/L (ref 7–177)

## 2017-04-06 LAB — RHEUMATOID FACTOR

## 2017-04-06 LAB — ANA: ANA: NEGATIVE

## 2017-04-08 ENCOUNTER — Other Ambulatory Visit: Payer: Self-pay | Admitting: Gastroenterology

## 2017-04-08 DIAGNOSIS — R110 Nausea: Secondary | ICD-10-CM

## 2017-04-08 DIAGNOSIS — R109 Unspecified abdominal pain: Secondary | ICD-10-CM

## 2017-04-08 NOTE — Telephone Encounter (Signed)
error 

## 2017-04-11 ENCOUNTER — Other Ambulatory Visit: Payer: Self-pay | Admitting: Internal Medicine

## 2017-04-11 DIAGNOSIS — M2550 Pain in unspecified joint: Secondary | ICD-10-CM

## 2017-04-11 DIAGNOSIS — R700 Elevated erythrocyte sedimentation rate: Secondary | ICD-10-CM

## 2017-04-11 NOTE — Progress Notes (Signed)
Order placed for rheumatology referral.  

## 2017-04-13 ENCOUNTER — Ambulatory Visit
Admission: RE | Admit: 2017-04-13 | Discharge: 2017-04-13 | Disposition: A | Discharge status: Home / Self Care | Payer: BLUE CROSS/BLUE SHIELD | Source: Ambulatory Visit | Attending: Gastroenterology | Admitting: Gastroenterology

## 2017-04-13 DIAGNOSIS — R110 Nausea: Secondary | ICD-10-CM | POA: Diagnosis present

## 2017-04-13 DIAGNOSIS — R109 Unspecified abdominal pain: Secondary | ICD-10-CM | POA: Diagnosis not present

## 2017-04-25 ENCOUNTER — Other Ambulatory Visit: Payer: Self-pay | Admitting: Internal Medicine

## 2017-05-03 ENCOUNTER — Ambulatory Visit: Payer: BLUE CROSS/BLUE SHIELD | Admitting: Urology

## 2017-05-09 ENCOUNTER — Ambulatory Visit
Admission: RE | Admit: 2017-05-09 | Discharge: 2017-05-09 | Disposition: A | Discharge status: Home / Self Care | Payer: BLUE CROSS/BLUE SHIELD | Source: Ambulatory Visit | Attending: Gastroenterology | Admitting: Gastroenterology

## 2017-05-09 DIAGNOSIS — R109 Unspecified abdominal pain: Secondary | ICD-10-CM | POA: Diagnosis not present

## 2017-05-09 DIAGNOSIS — R110 Nausea: Secondary | ICD-10-CM | POA: Insufficient documentation

## 2017-05-09 MED ORDER — TECHNETIUM TC 99M SULFUR COLLOID
2.0000 | Freq: Once | INTRAVENOUS | Status: AC | PRN
  Administered 2017-05-09: 2.43 via ORAL

## 2017-05-28 ENCOUNTER — Other Ambulatory Visit: Payer: Self-pay | Admitting: Internal Medicine

## 2017-06-22 ENCOUNTER — Other Ambulatory Visit: Payer: Self-pay | Admitting: Internal Medicine

## 2017-07-01 ENCOUNTER — Ambulatory Visit (INDEPENDENT_AMBULATORY_CARE_PROVIDER_SITE_OTHER): Payer: BLUE CROSS/BLUE SHIELD | Admitting: Internal Medicine

## 2017-07-01 ENCOUNTER — Encounter: Payer: Self-pay | Admitting: Internal Medicine

## 2017-07-01 ENCOUNTER — Other Ambulatory Visit: Payer: Self-pay

## 2017-07-01 DIAGNOSIS — F439 Reaction to severe stress, unspecified: Secondary | ICD-10-CM | POA: Diagnosis not present

## 2017-07-01 DIAGNOSIS — R059 Cough, unspecified: Secondary | ICD-10-CM

## 2017-07-01 DIAGNOSIS — E559 Vitamin D deficiency, unspecified: Secondary | ICD-10-CM | POA: Diagnosis not present

## 2017-07-01 DIAGNOSIS — D473 Essential (hemorrhagic) thrombocythemia: Secondary | ICD-10-CM | POA: Diagnosis not present

## 2017-07-01 DIAGNOSIS — I10 Essential (primary) hypertension: Secondary | ICD-10-CM

## 2017-07-01 DIAGNOSIS — K219 Gastro-esophageal reflux disease without esophagitis: Secondary | ICD-10-CM | POA: Diagnosis not present

## 2017-07-01 DIAGNOSIS — R05 Cough: Secondary | ICD-10-CM

## 2017-07-01 DIAGNOSIS — E7800 Pure hypercholesterolemia, unspecified: Secondary | ICD-10-CM | POA: Diagnosis not present

## 2017-07-01 DIAGNOSIS — R739 Hyperglycemia, unspecified: Secondary | ICD-10-CM

## 2017-07-01 DIAGNOSIS — D75839 Thrombocytosis, unspecified: Secondary | ICD-10-CM

## 2017-07-01 MED ORDER — PREDNISONE 10 MG PO TABS: ORAL_TABLET | ORAL | 0 refills | Status: DC

## 2017-07-01 NOTE — Progress Notes (Signed)
Patient ID: Nicole Sims, female   DOB: 10-11-1950, 66 y.o.   MRN: [id number]   Subjective:    Patient ID: Nicole Sims, female    DOB: 1951/02/21, 66 y.o.   MRN: [id number]  HPI  Patient here for a scheduled follow up.  She reports having increased congestion and cough.  States she "stays like this a lot".  This last episode worsened over the last two weeks.  Reports increased sinus congestion and drainage.  Also some chest congestion with cough.  Discussed using advair, saline nasal spray and flonase. She had not been using her advair.  No chest pain.  No acid reflux.  States this is under control.  No abdominal pain.  Bowels moving.  Recent gastric emptying study wnl.     Past Medical History:  Diagnosis Date  . Anemia   . Anxiety   . Asthma   . Chronic headaches   . Diverticulosis   . Environmental allergies   . GERD (gastroesophageal reflux disease)   . Hypercholesterolemia   . Hyperglycemia   . Hypertension   . Osteoarthritis   . Palpitations   . Thrombocytosis ([place])   . Urinary incontinence    mixed   Past Surgical History:  Procedure Laterality Date  . BLADDER SURGERY     x2   [facility]  . BREAST CYST ASPIRATION Bilateral 2005   approximate year  . CERVICAL CONE BIOPSY     [facility]  . CHOLECYSTECTOMY    . COLONOSCOPY WITH PROPOFOL N/A 07/19/2016   Performed by Manya Silvas, MD at [facility]  . ESOPHAGOGASTRODUODENOSCOPY (EGD) WITH PROPOFOL N/A 07/19/2016   Performed by Manya Silvas, MD at [facility]  . KNEE ARTHROSCOPY  08/13/08  . knee replacement and revision     left  . RECTOCELE REPAIR    . ROTATOR CUFF REPAIR     bilateral  . SHOULDER SURGERY  11/17/05  . TONSILLECTOMY  1962  . VAGINAL HYSTERECTOMY  1974   abnormal pap and carcinoma in situ   Family History  Problem Relation Age of Onset  . Diabetes Mellitus II Father   . Thyroid disease Father   . Breast cancer Maternal Aunt   . Colon cancer Neg Hx   . Kidney cancer Neg Hx    . Bladder Cancer Neg Hx    Social History   Socioeconomic History  . Marital status: Married    Spouse name: None  . Number of children: None  . Years of education: None  . Highest education level: None  Social Needs  . Financial resource strain: None  . Food insecurity - worry: None  . Food insecurity - inability: None  . Transportation needs - medical: None  . Transportation needs - non-medical: None  Occupational History  . None  Tobacco Use  . Smoking status: Former Smoker    Last attempt to quit: 08/17/1983    Years since quitting: 33.9  . Smokeless tobacco: Never Used  Substance and Sexual Activity  . Alcohol use: Yes    Alcohol/week: 0.0 oz    Comment: rarely  . Drug use: No  . Sexual activity: None  Other Topics Concern  . None  Social History Narrative  . None    Outpatient Encounter Medications as of 07/01/2017  Medication Sig  . albuterol (PROVENTIL HFA;VENTOLIN HFA) 108 (90 BASE) MCG/ACT inhaler Inhale 2 puffs into the lungs every 4 (four) hours as needed.  Marland Kitchen albuterol (PROVENTIL) (2.5 MG/3ML)  0.083% nebulizer solution Take 3 mLs (2.5 mg total) by nebulization every 6 (six) hours as needed for wheezing or shortness of breath.  Marland Kitchen aspirin EC 81 MG tablet Take 81 mg by mouth daily.  . cetirizine (ZYRTEC) 10 MG tablet Take 10 mg by mouth daily.  . Cholecalciferol (VITAMIN D-3) 1000 units CAPS Take by mouth.  . clobetasol cream (TEMOVATE) 8.93 % Apply 1 application topically 2 (two) times daily.  . fluticasone (FLONASE) 50 MCG/ACT nasal spray SHAKE WELL AND USE 2 SPRAYS IN EACH NOSTRIL EVERY DAY  . Fluticasone-Salmeterol (ADVAIR DISKUS) 250-50 MCG/DOSE AEPB Inhale 1 puff into the lungs 2 (two) times daily.  . magnesium oxide (MAG-OX) 400 MG tablet Take 400 mg daily by mouth.  . metoprolol succinate (TOPROL-XL) 50 MG 24 hr tablet TAKE 1 TABLET BY MOUTH EVERY DAY OR IMMEDIATELY FOLLOWING A MEAL  . Multiple Vitamin (MULTIVITAMIN) tablet Take 1 tablet by mouth daily.   . ondansetron (ZOFRAN) 4 MG tablet TAKE 1 TABLET(4 MG) BY MOUTH TWICE DAILY AS NEEDED FOR NAUSEA OR VOMITING  . RABEprazole (ACIPHEX) 20 MG tablet Take 20 mg by mouth 2 (two) times daily.  Marland Kitchen rOPINIRole (REQUIP) 0.5 MG tablet Take 0.'25mg'$  (1/2 tablet) nightly for one week then 0.'5mg'$  (1 tablet) nightly  . rosuvastatin (CRESTOR) 5 MG tablet TAKE 1 TABLET BY MOUTH EVERY DAY  . sucralfate (CARAFATE) 1 g tablet Take 1 g by mouth 3 (three) times daily.  . tamsulosin (FLOMAX) 0.4 MG CAPS capsule Take 1 capsule (0.4 mg total) by mouth daily.  Marland Kitchen venlafaxine XR (EFFEXOR-XR) 150 MG 24 hr capsule TAKE 1 CAPSULE BY MOUTH EVERY DAY WITH BREAKFAST  . [DISCONTINUED] rosuvastatin (CRESTOR) 5 MG tablet TAKE 1 TABLET BY MOUTH EVERY DAY  . predniSONE (DELTASONE) 10 MG tablet Take 4 tablets x 1 day and then decrease by 1/2 tablet per day until down to zero mg.   No facility-administered encounter medications on file as of 07/01/2017.     Review of Systems  Constitutional: Negative for appetite change and unexpected weight change.  HENT: Positive for congestion and postnasal drip.   Respiratory: Positive for cough. Negative for chest tightness and shortness of breath.   Cardiovascular: Negative for chest pain and leg swelling.  Gastrointestinal: Negative for abdominal pain, diarrhea and vomiting.  Genitourinary: Negative for difficulty urinating and dysuria.  Musculoskeletal: Negative for joint swelling and myalgias.  Skin: Negative for color change and rash.  Neurological: Negative for dizziness, light-headedness and headaches.  Psychiatric/Behavioral: Negative for agitation and dysphoric mood.       Objective:    Physical Exam  Constitutional: She appears well-developed and well-nourished. No distress.  HENT:  Nose: Nose normal.  Mouth/Throat: Oropharynx is clear and moist.  Neck: Neck supple. No thyromegaly present.  Cardiovascular: Normal rate and regular rhythm.  Pulmonary/Chest: Breath sounds  normal. No respiratory distress.  Cough with forced expiration.    Abdominal: Soft. Bowel sounds are normal. There is no tenderness.  Musculoskeletal: She exhibits no edema or tenderness.  Lymphadenopathy:    She has no cervical adenopathy.  Skin: No rash noted. No erythema.  Psychiatric: She has a normal mood and affect. Her behavior is normal.    BP 130/82 (BP Location: Left Arm, Patient Position: Sitting, Cuff Size: Large)   Pulse 68   Temp 98.2 F (36.8 C) (Oral)   Resp 14   Ht '5\' 5"'$  (1.651 m)   Wt 247 lb (112 kg)   SpO2 96%   BMI 41.10  kg/m  Wt Readings from Last 3 Encounters:  07/01/17 247 lb (112 kg)  03/31/17 244 lb 14.4 oz (111.1 kg)  03/30/17 244 lb 3.2 oz (110.8 kg)     Lab Results  Component Value Date   WBC 9.0 04/05/2017   HGB 12.9 04/05/2017   HCT 40.1 04/05/2017   PLT 460.0 (H) 04/05/2017   GLUCOSE 108 (H) 04/05/2017   CHOL 175 04/05/2017   TRIG 114.0 04/05/2017   HDL 56.60 04/05/2017   LDLDIRECT 172.8 09/17/2013   LDLCALC 96 04/05/2017   ALT 17 04/05/2017   AST 16 04/05/2017   NA 140 04/05/2017   K 4.4 04/05/2017   CL 105 04/05/2017   CREATININE 0.97 04/05/2017   BUN 14 04/05/2017   CO2 31 04/05/2017   TSH 1.82 08/19/2016   INR 0.9 07/02/2013   HGBA1C 6.0 04/05/2017   MICROALBUR 1.1 04/28/2015    Nm Gastric Emptying  Result Date: 05/09/2017 CLINICAL DATA:  Chronic 9 month history of nausea which has worsened in the past 2 months, associated with intermittent abdominal pain and constipation. EXAM: NUCLEAR MEDICINE GASTRIC EMPTYING SCAN TECHNIQUE: After oral ingestion of radiolabeled meal, sequential abdominal images were obtained for 4 hours. Percentage of activity emptying the stomach was calculated at 1 hour, 2 hour, 3 hour, and 4 hours. RADIOPHARMACEUTICALS:  2.43 mCi Tc-80msulfur colloid in standardized meal (2 eggs, 2 pieces of toast and 4 ounces of water) COMPARISON:  None. FINDINGS: Expected location of the stomach in the left upper  quadrant. Ingested meal empties the stomach gradually over the course of the study. 23% emptied at 1 hr ( normal >= 10%) 67% emptied at 2 hr ( normal >= 40%) 86% emptied at 3 hr ( normal >= 70%) 94% emptied at 4 hr ( normal >= 90%) IMPRESSION: Normal gastric emptying study. Electronically Signed   By: TEvangeline DakinM.D.   On: 05/09/2017 15:29       Assessment & Plan:   Problem List Items Addressed This Visit    Cough    With persistent intermittent cough and chest congestion with some sinus congestion.  Worsened recently - over the last two weeks, but continues to flare intermittently.  Discussed restarting regular use of her advair.  Use saline nasal spray and her flonase.  Prednisone taper as directed.  Last cxr 12/2016 - no acute abnormality.  Hold on abx.  Discussed further w/up.  She agrees to pulmonary referral and request to see Dr MLake Bells        Relevant Orders   Ambulatory referral to Pulmonology   GERD (gastroesophageal reflux disease)    Previous EGD 07/19/16 - gastroesophagitis.  On aciphex.  States controlled now.        Relevant Medications   magnesium oxide (MAG-OX) 400 MG tablet   Hypercholesterolemia    On crestor.  Low cholesterol diet and exercise.  Follow lipid panel and liver function tests.        Relevant Orders   Hepatic function panel   Lipid panel   Hyperglycemia    Low carb diet and exercise.  Follow met b and a1c.        Relevant Orders   Hemoglobin A1c   Hypertension    Blood pressure under good control.  Continue same medication regimen.  Follow pressures.  Follow metabolic panel.        Relevant Orders   TSH   Basic metabolic panel   Severe obesity (BMI >= 40) (HCC)  Discussed diet and exercise.  Follow.       Relevant Medications   magnesium oxide (MAG-OX) 400 MG tablet   Stress    Handling stress relatively well.  No further intervention at this time.  Follow.       Thrombocytosis (HCC)    Platelet count slightly increased.  Stable.  Follow cbc.       Relevant Orders   CBC with Differential/Platelet   Vitamin D deficiency    Follow vitamin d level.       Relevant Orders   VITAMIN D 25 Hydroxy (Vit-D Deficiency, Fractures)       Einar Pheasant, MD

## 2017-07-04 ENCOUNTER — Encounter: Payer: Self-pay | Admitting: Internal Medicine

## 2017-07-04 DIAGNOSIS — R928 Other abnormal and inconclusive findings on diagnostic imaging of breast: Secondary | ICD-10-CM

## 2017-07-04 NOTE — Assessment & Plan Note (Signed)
Previous EGD 07/19/16 - gastroesophagitis.  On aciphex.  States controlled now.

## 2017-07-04 NOTE — Assessment & Plan Note (Signed)
Handling stress relatively well.  No further intervention at this time.  Follow.

## 2017-07-04 NOTE — Assessment & Plan Note (Signed)
On crestor.  Low cholesterol diet and exercise.  Follow lipid panel and liver function tests.   

## 2017-07-04 NOTE — Assessment & Plan Note (Signed)
Discussed diet and exercise.  Follow.  

## 2017-07-04 NOTE — Assessment & Plan Note (Signed)
Low carb diet and exercise.  Follow met b and a1c.   

## 2017-07-04 NOTE — Assessment & Plan Note (Signed)
Platelet count slightly increased. Stable.  Follow cbc.

## 2017-07-04 NOTE — Assessment & Plan Note (Signed)
Blood pressure under good control.  Continue same medication regimen.  Follow pressures.  Follow metabolic panel.   

## 2017-07-04 NOTE — Assessment & Plan Note (Signed)
With persistent intermittent cough and chest congestion with some sinus congestion.  Worsened recently - over the last two weeks, but continues to flare intermittently.  Discussed restarting regular use of her advair.  Use saline nasal spray and her flonase.  Prednisone taper as directed.  Last cxr 12/2016 - no acute abnormality.  Hold on abx.  Discussed further w/up.  She agrees to pulmonary referral and request to see Dr Lake Bells.

## 2017-07-04 NOTE — Assessment & Plan Note (Signed)
Follow vitamin d level.   

## 2017-07-11 NOTE — Telephone Encounter (Signed)
Orders placed for bilateral diagnostic mammogram with right breast ultrasound.  Let us know if problems scheduling.

## 2017-07-21 ENCOUNTER — Other Ambulatory Visit: Payer: BLUE CROSS/BLUE SHIELD

## 2017-07-21 ENCOUNTER — Ambulatory Visit (INDEPENDENT_AMBULATORY_CARE_PROVIDER_SITE_OTHER): Payer: BLUE CROSS/BLUE SHIELD | Admitting: Pulmonary Disease

## 2017-07-21 ENCOUNTER — Other Ambulatory Visit: Payer: Self-pay | Admitting: Internal Medicine

## 2017-07-21 ENCOUNTER — Encounter: Payer: Self-pay | Admitting: Pulmonary Disease

## 2017-07-21 VITALS — BP 142/80 | HR 82 | Ht 65.0 in | Wt 244.4 lb

## 2017-07-21 DIAGNOSIS — R062 Wheezing: Secondary | ICD-10-CM | POA: Diagnosis not present

## 2017-07-21 DIAGNOSIS — J301 Allergic rhinitis due to pollen: Secondary | ICD-10-CM | POA: Diagnosis not present

## 2017-07-21 DIAGNOSIS — R0602 Shortness of breath: Secondary | ICD-10-CM

## 2017-07-21 LAB — NITRIC OXIDE: Nitric Oxide: 8

## 2017-07-21 NOTE — Patient Instructions (Addendum)
Recurrent upper respiratory infections with possible asthma: We will check an exhaled nitric oxide test today to look for evidence of inflammation in your airways We will check a lung function test We will check a blood test called a serum IgE that looks for evidence of allergy Keep taking Advair for now  Allergic rhinitis: I believe that allergies may be the source of your problems, but we will check for asthma first. Use Neil Med rinses with distilled water at least twice per day using the instructions on the package. 1/2 hour after using the [facility] Med rinse, use Nasacort two puffs in each nostril once per day.  Remember that the Nasacort can take 1-2 weeks to work after regular use. Use generic zyrtec (cetirizine) every day.  If this doesn't help, then stop taking it and use chlorpheniramine-phenylephrine combination tablets.  The asthma testing is normal then we will obtain a CT scan of your sinuses on the next visit

## 2017-07-21 NOTE — Progress Notes (Signed)
Subjective:   PATIENT ID: Nicole Sims GENDER: female DOB: 23-Jan-1951, MRN: [id number]  Synopsis: Referred in Dec 2018 for cough by Dr. Nicki Reaper  HPI  Chief Complaint  Patient presents with  . pulmonary consult    referred by Dr. Nicki Reaper for wheezing   Nicole Sims says that she has been struggling with a lot of "breathing issues" and feels like she is sick all the time.  Specifically she means that she has frequent and recurrent cough and wheezing.    Allergic rhinitis: > occurs year round > says that she had allergy testing several years ago > she was told that she should avoid being around strong fumes like men's cologne because this would make her sympotms worse > she hs sinus congestion and runny nose > she has been taking mucinex regularly > she has been using "CF Tussin" which helpes  She has chronic headaches: > she attributes this to her allergies  She has recurrent episodes of cough and wheezing: > last week she had cough and wheezing > she was treated with prednisone and she has had some hypertension and tachycardia since then > the prednisone drives her blood sugar up as well > she says that several times per year she will be treated for wheezing, at least every three months > she has hoarseness, dyspnea, cough, chest congestion, sometimes a productive cough > the prednisone helps when she takes it but she is worried that it causes her problems  She says that she has felt like she has had a continuous cold for at least 6 months now.     Past Medical History:  Diagnosis Date  . Anemia   . Anxiety   . Asthma   . Chronic headaches   . Diverticulosis   . Environmental allergies   . GERD (gastroesophageal reflux disease)   . Hypercholesterolemia   . Hyperglycemia   . Hypertension   . Osteoarthritis   . Palpitations   . Thrombocytosis ([place])   . Urinary incontinence    mixed     Family History  Problem Relation Age of Onset  . Diabetes Mellitus II Father     . Thyroid disease Father   . Breast cancer Maternal Aunt   . Colon cancer Neg Hx   . Kidney cancer Neg Hx   . Bladder Cancer Neg Hx      Social History   Socioeconomic History  . Marital status: Married    Spouse name: Not on file  . Number of children: Not on file  . Years of education: Not on file  . Highest education level: Not on file  Social Needs  . Financial resource strain: Not on file  . Food insecurity - worry: Not on file  . Food insecurity - inability: Not on file  . Transportation needs - medical: Not on file  . Transportation needs - non-medical: Not on file  Occupational History  . Not on file  Tobacco Use  . Smoking status: Former Smoker    Last attempt to quit: 08/17/1983    Years since quitting: 33.9  . Smokeless tobacco: Never Used  Substance and Sexual Activity  . Alcohol use: Yes    Alcohol/week: 0.0 oz    Comment: rarely  . Drug use: No  . Sexual activity: Not on file  Other Topics Concern  . Not on file  Social History Narrative  . Not on file     Allergies  Allergen Reactions  . Augmentin [  Amoxicillin-Pot Clavulanate] Other (See Comments)    Questionable itching  . Bactrim [Sulfamethoxazole-Trimethoprim]   . Doxycycline Itching  . Hydrocodone-Acetaminophen Other (See Comments)    GI distress  . Pseudoephedrine     Itching of the scalp  . Relafen [Nabumetone] Other (See Comments)    Itching/rash     Outpatient Medications Prior to Visit  Medication Sig Dispense Refill  . albuterol (PROVENTIL HFA;VENTOLIN HFA) 108 (90 BASE) MCG/ACT inhaler Inhale 2 puffs into the lungs every 4 (four) hours as needed. 1 Inhaler 3  . albuterol (PROVENTIL) (2.5 MG/3ML) 0.083% nebulizer solution Take 3 mLs (2.5 mg total) by nebulization every 6 (six) hours as needed for wheezing or shortness of breath. 150 mL 1  . aspirin EC 81 MG tablet Take 81 mg by mouth daily.    . cetirizine (ZYRTEC) 10 MG tablet Take 10 mg by mouth daily.    . Cholecalciferol  (VITAMIN D-3) 1000 units CAPS Take by mouth.    . clobetasol cream (TEMOVATE) 4.27 % Apply 1 application topically 2 (two) times daily.    . fluticasone (FLONASE) 50 MCG/ACT nasal spray SHAKE WELL AND USE 2 SPRAYS IN EACH NOSTRIL EVERY DAY 16 g 0  . Fluticasone-Salmeterol (ADVAIR DISKUS) 250-50 MCG/DOSE AEPB Inhale 1 puff into the lungs 2 (two) times daily. 60 each 2  . magnesium oxide (MAG-OX) 400 MG tablet Take 400 mg daily by mouth.    . metoprolol succinate (TOPROL-XL) 50 MG 24 hr tablet TAKE 1 TABLET BY MOUTH EVERY DAY OR IMMEDIATELY FOLLOWING A MEAL 30 tablet 0  . Multiple Vitamin (MULTIVITAMIN) tablet Take 1 tablet by mouth daily.    . ondansetron (ZOFRAN) 4 MG tablet TAKE 1 TABLET(4 MG) BY MOUTH TWICE DAILY AS NEEDED FOR NAUSEA OR VOMITING 20 tablet 0  . RABEprazole (ACIPHEX) 20 MG tablet Take 20 mg by mouth 2 (two) times daily.    Marland Kitchen rOPINIRole (REQUIP) 0.5 MG tablet Take 0.25mg  (1/2 tablet) nightly for one week then 0.5mg  (1 tablet) nightly    . rosuvastatin (CRESTOR) 5 MG tablet TAKE 1 TABLET BY MOUTH EVERY DAY 30 tablet 0  . sucralfate (CARAFATE) 1 g tablet Take 1 g by mouth 3 (three) times daily.    . tamsulosin (FLOMAX) 0.4 MG CAPS capsule Take 1 capsule (0.4 mg total) by mouth daily. 30 capsule 3  . venlafaxine XR (EFFEXOR-XR) 150 MG 24 hr capsule TAKE 1 CAPSULE BY MOUTH EVERY DAY WITH BREAKFAST 30 capsule 0  . predniSONE (DELTASONE) 10 MG tablet Take 4 tablets x 1 day and then decrease by 1/2 tablet per day until down to zero mg. 18 tablet 0   No facility-administered medications prior to visit.     Review of Systems  Constitutional: Positive for malaise/fatigue. Negative for chills, fever and weight loss.  HENT: Positive for congestion and sinus pain. Negative for nosebleeds and sore throat.   Eyes: Negative for photophobia, pain and discharge.  Respiratory: Positive for cough, shortness of breath and wheezing. Negative for hemoptysis and sputum production.   Cardiovascular:  Positive for chest pain. Negative for palpitations, orthopnea and leg swelling.  Gastrointestinal: Negative for abdominal pain, constipation, diarrhea, nausea and vomiting.  Genitourinary: Negative for dysuria, frequency, hematuria and urgency.  Musculoskeletal: Positive for back pain, myalgias and neck pain. Negative for joint pain.  Skin: Negative for itching and rash.  Neurological: Positive for weakness and headaches. Negative for tingling, tremors, sensory change, speech change, focal weakness and seizures.  Psychiatric/Behavioral: Negative for memory loss,  substance abuse and suicidal ideas. The patient is not nervous/anxious.       Objective:  Physical Exam   Vitals:   07/21/17 1150  BP: (!) 142/80  Pulse: 82  SpO2: 98%  Weight: 244 lb 6.4 oz (110.9 kg)  Height: 5\' 5"  (1.651 m)    Gen: obese, chronically ill appearing, no acute distress HENT: NCAT, OP clear, neck supple without masses Eyes: PERRL, EOMi Lymph: no cervical lymphadenopathy PULM: CTA B CV: RRR, no mgr, no JVD GI: BS+, soft, nontender, no hsm Derm: no rash or skin breakdown MSK: normal bulk and tone Neuro: A&Ox4, CN II-XII intact, strength 5/5 in all 4 extremities Psyche: normal mood and affect   CBC    Component Value Date/Time   WBC 9.0 04/05/2017 0937   RBC 4.60 04/05/2017 0937   HGB 12.9 04/05/2017 0937   HGB 11.6 (L) 07/11/2013 0446   HCT 40.1 04/05/2017 0937   HCT 37.9 07/02/2013 1506   PLT 460.0 (H) 04/05/2017 0937   PLT 367 07/11/2013 0446   MCV 87.2 04/05/2017 0937   MCV 85 07/02/2013 1506   MCH 28.2 07/02/2013 1506   MCHC 32.1 04/05/2017 0937   RDW 15.1 04/05/2017 0937   RDW 14.3 07/02/2013 1506   LYMPHSABS 3.0 04/05/2017 0937   MONOABS 0.6 04/05/2017 0937   EOSABS 0.2 04/05/2017 0937   BASOSABS 0.1 04/05/2017 6734     Chest imaging:  PFT:  Exhaled nitric oxide testing: December 2018 8 ppm  Labs:  Path:  Echo:  Heart Catheterization:   Records from her visit  with Dr. Nicki Reaper in November 2018 reviewed where she was seen for cough, had been treated with prednisone and Advair.    Assessment & Plan:   Wheezing - Plan: Nitric oxide  Shortness of breath - Plan: Pulmonary Function Test, IgE  Non-seasonal allergic rhinitis due to pollen  Discussion: Ms. Morton presents with recurrent wheezing and recurrent exacerbations of rhinitis and cough and wheeze.  It is difficult to know if this is due to asthma or vocal cord irritation allergic rhinitis with intermittent infections.  Based on the fact that she has clear lungs right now and that she currently claims to be ill despite having normal vital signs and a normal physical exam I think that it is unlikely that she has asthma as a cause of her recurrent symptoms.  However, given her complaint of shortness of breath and wheezing we will do a thorough asthma evaluation with an XL nitric oxide test lung function testing and serum IgE.  I will have her stay on Advair for now.  However, if those tests are normal then I think we will need to evaluate her sinuses with a CT scan when she comes back and consider sending her back to an allergist for further evaluation.  Plan: Recurrent upper respiratory infections with possible asthma: We will check an exhaled nitric oxide test today to look for evidence of inflammation in your airways We will check a lung function test We will check a blood test called a serum IgE that looks for evidence of allergy Keep taking Advair for now  Allergic rhinitis: I believe that allergies may be the source of your problems, but we will check for asthma first. Use Neil Med rinses with distilled water at least twice per day using the instructions on the package. 1/2 hour after using the [facility] Med rinse, use Nasacort two puffs in each nostril once per day.  Remember that the Nasacort can  take 1-2 weeks to work after regular use. Use generic zyrtec (cetirizine) every day.  If this doesn't help,  then stop taking it and use chlorpheniramine-phenylephrine combination tablets.  The asthma testing is normal then we will obtain a CT scan of your sinuses on the next visit     Current Outpatient Medications:  .  albuterol (PROVENTIL HFA;VENTOLIN HFA) 108 (90 BASE) MCG/ACT inhaler, Inhale 2 puffs into the lungs every 4 (four) hours as needed., Disp: 1 Inhaler, Rfl: 3 .  albuterol (PROVENTIL) (2.5 MG/3ML) 0.083% nebulizer solution, Take 3 mLs (2.5 mg total) by nebulization every 6 (six) hours as needed for wheezing or shortness of breath., Disp: 150 mL, Rfl: 1 .  aspirin EC 81 MG tablet, Take 81 mg by mouth daily., Disp: , Rfl:  .  cetirizine (ZYRTEC) 10 MG tablet, Take 10 mg by mouth daily., Disp: , Rfl:  .  Cholecalciferol (VITAMIN D-3) 1000 units CAPS, Take by mouth., Disp: , Rfl:  .  clobetasol cream (TEMOVATE) 2.29 %, Apply 1 application topically 2 (two) times daily., Disp: , Rfl:  .  fluticasone (FLONASE) 50 MCG/ACT nasal spray, SHAKE WELL AND USE 2 SPRAYS IN EACH NOSTRIL EVERY DAY, Disp: 16 g, Rfl: 0 .  Fluticasone-Salmeterol (ADVAIR DISKUS) 250-50 MCG/DOSE AEPB, Inhale 1 puff into the lungs 2 (two) times daily., Disp: 60 each, Rfl: 2 .  magnesium oxide (MAG-OX) 400 MG tablet, Take 400 mg daily by mouth., Disp: , Rfl:  .  metoprolol succinate (TOPROL-XL) 50 MG 24 hr tablet, TAKE 1 TABLET BY MOUTH EVERY DAY OR IMMEDIATELY FOLLOWING A MEAL, Disp: 30 tablet, Rfl: 0 .  Multiple Vitamin (MULTIVITAMIN) tablet, Take 1 tablet by mouth daily., Disp: , Rfl:  .  ondansetron (ZOFRAN) 4 MG tablet, TAKE 1 TABLET(4 MG) BY MOUTH TWICE DAILY AS NEEDED FOR NAUSEA OR VOMITING, Disp: 20 tablet, Rfl: 0 .  RABEprazole (ACIPHEX) 20 MG tablet, Take 20 mg by mouth 2 (two) times daily., Disp: , Rfl:  .  rOPINIRole (REQUIP) 0.5 MG tablet, Take 0.25mg  (1/2 tablet) nightly for one week then 0.5mg  (1 tablet) nightly, Disp: , Rfl:  .  rosuvastatin (CRESTOR) 5 MG tablet, TAKE 1 TABLET BY MOUTH EVERY DAY, Disp: 30  tablet, Rfl: 0 .  sucralfate (CARAFATE) 1 g tablet, Take 1 g by mouth 3 (three) times daily., Disp: , Rfl:  .  tamsulosin (FLOMAX) 0.4 MG CAPS capsule, Take 1 capsule (0.4 mg total) by mouth daily., Disp: 30 capsule, Rfl: 3 .  venlafaxine XR (EFFEXOR-XR) 150 MG 24 hr capsule, TAKE 1 CAPSULE BY MOUTH EVERY DAY WITH BREAKFAST, Disp: 30 capsule, Rfl: 0

## 2017-07-22 ENCOUNTER — Other Ambulatory Visit: Payer: Self-pay | Admitting: Internal Medicine

## 2017-07-22 LAB — IGE: IGE (IMMUNOGLOBULIN E), SERUM: 6 kU/L (ref <=114)

## 2017-07-22 NOTE — Telephone Encounter (Signed)
This was given to her to use for an acute problem.  Is she having issues now?  I do not mind refilling, just need a little more information.  Thanks.

## 2017-07-22 NOTE — Telephone Encounter (Signed)
ok'd refill x 1.  If persistent problems,let us know.

## 2017-07-22 NOTE — Telephone Encounter (Signed)
Informed patient that Rx refill was sent to pharmacy and to call if problems persist, patient verbalized understanding.

## 2017-07-22 NOTE — Telephone Encounter (Signed)
Patient stated that she has the same symptoms from before, mouth burning/pain, unable to eat or swallow, thrush. Patient wanted to see if she could get refill or if Dr think something else would be better.

## 2017-07-25 ENCOUNTER — Other Ambulatory Visit: Payer: BLUE CROSS/BLUE SHIELD

## 2017-08-03 ENCOUNTER — Other Ambulatory Visit (INDEPENDENT_AMBULATORY_CARE_PROVIDER_SITE_OTHER): Payer: BLUE CROSS/BLUE SHIELD

## 2017-08-03 DIAGNOSIS — E559 Vitamin D deficiency, unspecified: Secondary | ICD-10-CM

## 2017-08-03 DIAGNOSIS — D473 Essential (hemorrhagic) thrombocythemia: Secondary | ICD-10-CM

## 2017-08-03 DIAGNOSIS — R739 Hyperglycemia, unspecified: Secondary | ICD-10-CM

## 2017-08-03 DIAGNOSIS — I10 Essential (primary) hypertension: Secondary | ICD-10-CM

## 2017-08-03 DIAGNOSIS — D75839 Thrombocytosis, unspecified: Secondary | ICD-10-CM

## 2017-08-03 DIAGNOSIS — E7800 Pure hypercholesterolemia, unspecified: Secondary | ICD-10-CM

## 2017-08-03 LAB — CBC WITH DIFFERENTIAL/PLATELET
BASOS PCT: 1 % (ref 0.0–3.0)
Basophils Absolute: 0.1 10*3/uL (ref 0.0–0.1)
EOS PCT: 3 % (ref 0.0–5.0)
Eosinophils Absolute: 0.3 10*3/uL (ref 0.0–0.7)
HEMATOCRIT: 39.5 % (ref 36.0–46.0)
HEMOGLOBIN: 12.8 g/dL (ref 12.0–15.0)
Lymphocytes Relative: 36.6 % (ref 12.0–46.0)
Lymphs Abs: 3.1 10*3/uL (ref 0.7–4.0)
MCHC: 32.5 g/dL (ref 30.0–36.0)
MCV: 84.9 fl (ref 78.0–100.0)
MONOS PCT: 6.8 % (ref 3.0–12.0)
Monocytes Absolute: 0.6 10*3/uL (ref 0.1–1.0)
Neutro Abs: 4.4 10*3/uL (ref 1.4–7.7)
Neutrophils Relative %: 52.6 % (ref 43.0–77.0)
Platelets: 466 10*3/uL — ABNORMAL HIGH (ref 150.0–400.0)
RBC: 4.65 Mil/uL (ref 3.87–5.11)
RDW: 14.8 % (ref 11.5–15.5)
WBC: 8.3 10*3/uL (ref 4.0–10.5)

## 2017-08-03 LAB — VITAMIN D 25 HYDROXY (VIT D DEFICIENCY, FRACTURES): VITD: 27.25 ng/mL — AB (ref 30.00–100.00)

## 2017-08-03 LAB — HEMOGLOBIN A1C: HEMOGLOBIN A1C: 6.2 % (ref 4.6–6.5)

## 2017-08-03 LAB — HEPATIC FUNCTION PANEL
ALT: 15 U/L (ref 0–35)
AST: 11 U/L (ref 0–37)
Albumin: 3.7 g/dL (ref 3.5–5.2)
Alkaline Phosphatase: 89 U/L (ref 39–117)
BILIRUBIN DIRECT: 0.2 mg/dL (ref 0.0–0.3)
BILIRUBIN TOTAL: 0.5 mg/dL (ref 0.2–1.2)
Total Protein: 7.4 g/dL (ref 6.0–8.3)

## 2017-08-03 LAB — LIPID PANEL
CHOLESTEROL: 206 mg/dL — AB (ref 0–200)
HDL: 54.5 mg/dL (ref >39.00)
LDL CALC: 124 mg/dL — AB (ref 0–99)
NONHDL: 151.17
Total CHOL/HDL Ratio: 4
Triglycerides: 138 mg/dL (ref 0.0–149.0)
VLDL: 27.6 mg/dL (ref 0.0–40.0)

## 2017-08-03 LAB — BASIC METABOLIC PANEL
BUN: 13 mg/dL (ref 6–23)
CALCIUM: 9 mg/dL (ref 8.4–10.5)
CHLORIDE: 107 meq/L (ref 96–112)
CO2: 27 meq/L (ref 19–32)
CREATININE: 1.03 mg/dL (ref 0.40–1.20)
GFR: 56.76 mL/min — ABNORMAL LOW (ref >60.00)
Glucose, Bld: 104 mg/dL — ABNORMAL HIGH (ref 70–99)
Potassium: 4.3 mEq/L (ref 3.5–5.1)
Sodium: 143 mEq/L (ref 135–145)

## 2017-08-03 LAB — TSH: TSH: 2.17 u[IU]/mL (ref 0.35–4.50)

## 2017-08-04 ENCOUNTER — Encounter: Payer: Self-pay | Admitting: Adult Health

## 2017-08-04 ENCOUNTER — Ambulatory Visit (INDEPENDENT_AMBULATORY_CARE_PROVIDER_SITE_OTHER)
Admission: RE | Admit: 2017-08-04 | Discharge: 2017-08-04 | Disposition: A | Discharge status: Home / Self Care | Payer: BLUE CROSS/BLUE SHIELD | Source: Ambulatory Visit | Attending: Adult Health | Admitting: Adult Health

## 2017-08-04 ENCOUNTER — Ambulatory Visit (INDEPENDENT_AMBULATORY_CARE_PROVIDER_SITE_OTHER): Payer: BLUE CROSS/BLUE SHIELD | Admitting: Pulmonary Disease

## 2017-08-04 ENCOUNTER — Ambulatory Visit (INDEPENDENT_AMBULATORY_CARE_PROVIDER_SITE_OTHER): Payer: BLUE CROSS/BLUE SHIELD | Admitting: Adult Health

## 2017-08-04 DIAGNOSIS — R05 Cough: Secondary | ICD-10-CM

## 2017-08-04 DIAGNOSIS — R059 Cough, unspecified: Secondary | ICD-10-CM

## 2017-08-04 DIAGNOSIS — K219 Gastro-esophageal reflux disease without esophagitis: Secondary | ICD-10-CM

## 2017-08-04 DIAGNOSIS — R0602 Shortness of breath: Secondary | ICD-10-CM | POA: Diagnosis not present

## 2017-08-04 LAB — PULMONARY FUNCTION TEST
DL/VA % pred: 100 %
DL/VA: 4.92 ml/min/mmHg/L
DLCO COR % PRED: 84 %
DLCO COR: 21.65 ml/min/mmHg
DLCO UNC % PRED: 82 %
DLCO UNC: 21.02 ml/min/mmHg
FEF 25-75 POST: 3.16 L/s
FEF 25-75 PRE: 2.62 L/s
FEF2575-%Change-Post: 20 %
FEF2575-%PRED-PRE: 125 %
FEF2575-%Pred-Post: 152 %
FEV1-%Change-Post: 3 %
FEV1-%PRED-POST: 98 %
FEV1-%PRED-PRE: 94 %
FEV1-POST: 2.42 L
FEV1-Pre: 2.33 L
FEV1FVC-%Change-Post: 6 %
FEV1FVC-%Pred-Pre: 107 %
FEV6-%CHANGE-POST: -2 %
FEV6-%PRED-POST: 88 %
FEV6-%PRED-PRE: 90 %
FEV6-PRE: 2.8 L
FEV6-Post: 2.74 L
FEV6FVC-%PRED-POST: 104 %
FEV6FVC-%PRED-PRE: 104 %
FVC-%Change-Post: -2 %
FVC-%PRED-POST: 85 %
FVC-%Pred-Pre: 87 %
FVC-Post: 2.74 L
FVC-Pre: 2.8 L
POST FEV6/FVC RATIO: 100 %
PRE FEV1/FVC RATIO: 83 %
Post FEV1/FVC ratio: 88 %
Pre FEV6/FVC Ratio: 100 %
RV % pred: 82 %
RV: 1.79 L
TLC % PRED: 89 %
TLC: 4.67 L

## 2017-08-04 MED ORDER — BUDESONIDE-FORMOTEROL FUMARATE 80-4.5 MCG/ACT IN AERO: 2.0000 | INHALATION_SPRAY | Freq: Two times a day (BID) | RESPIRATORY_TRACT | 3 refills | Status: DC

## 2017-08-04 NOTE — Assessment & Plan Note (Signed)
Recurrent cough - ? Mild asthma/RAD /UACS with AR/GERD triggers.  Check cxr today  Check CT sinus  Cont on trigger control  Change Advair to Symbicort ? DPI may be aggravating upper airway .  PFT showed normal lung function .   Plan  Patient Instructions  Set up CT sinus .  Change Advair to Symbicort 2 puffs Twice daily  , rinse after use.  Continue on Zyrtec and Nasacort .  Continue on Aciphex daily.  Follow up with Dr. Lake Bells in 4-6 weeks and As needed   Please contact office for sooner follow up if symptoms do not improve or worsen or seek emergency care

## 2017-08-04 NOTE — Progress Notes (Signed)
PFT done today. 

## 2017-08-04 NOTE — Assessment & Plan Note (Signed)
Cont on GERD tx . Joyice Faster

## 2017-08-04 NOTE — Patient Instructions (Signed)
Set up CT sinus .  Change Advair to Symbicort 2 puffs Twice daily  , rinse after use.  Continue on Zyrtec and Nasacort .  Continue on Aciphex daily.  Follow up with Dr. Lake Bells in 4-6 weeks and As needed   Please contact office for sooner follow up if symptoms do not improve or worsen or seek emergency care

## 2017-08-04 NOTE — Addendum Note (Signed)
Addended by: Parke Poisson E on: 08/04/2017 03:23 PM   Modules accepted: Orders

## 2017-08-04 NOTE — Addendum Note (Signed)
Addended by: Parke Poisson E on: 08/04/2017 05:01 PM   Modules accepted: Orders

## 2017-08-04 NOTE — Progress Notes (Signed)
@Patient  ID: Nicole Sims, female    DOB: Oct 08, 1950, 66 y.o.   MRN: [id number]  Chief Complaint  Patient presents with  . Follow-up    Referring provider: Einar Pheasant, MD  HPI: 66 year old female former smoker seen for pulmonary consult July 21, 2017 for wheezing and cough..  Test August 04, 2017 FEV1 98%, ratio 88, no significant bronchodilator response, FVC 85%, DLCO 82%. Chest x-ray May 2018 clear lungs. Exhaled nitric oxide testing December 2018 was 8 PPB IgE -6   08/04/2017 follow-up:: Cough and wheezing Pt returns for 2 week follow up . She was seen last ov for pulmonary consult for 2 years of recurrent cough and wheezing . She says she gets recurrent asthmatic bronchitis with frequent steroids +/- abx . Has persistent sinus congestion and drainage . Takes mucinex , zyrtec , flonase .  Uses albuterol inhaler 1-2 x week.  Says when she get ill she has more asthma like symptoms with cough , wheezing and doe.  Change in weather , perfume, pesticides seem to be a trigger.  On Aciphex , denies GERD.  Under a lot of stress .  IgE was nml.   Patient had pulmonary function test today that showed normal lung function.   Allergies  Allergen Reactions  . Augmentin [Amoxicillin-Pot Clavulanate] Other (See Comments)    Questionable itching  . Bactrim [Sulfamethoxazole-Trimethoprim]   . Doxycycline Itching  . Hydrocodone-Acetaminophen Other (See Comments)    GI distress  . Pseudoephedrine     Itching of the scalp  . Relafen [Nabumetone] Other (See Comments)    Itching/rash    Immunization History  Administered Date(s) Administered  . Influenza,inj,Quad PF,6+ Mos 06/18/2013  . Influenza-Unspecified 04/30/2014, 06/21/2016    Past Medical History:  Diagnosis Date  . Anemia   . Anxiety   . Asthma   . Chronic headaches   . Diverticulosis   . Environmental allergies   . GERD (gastroesophageal reflux disease)   . Hypercholesterolemia   . Hyperglycemia   .  Hypertension   . Osteoarthritis   . Palpitations   . Thrombocytosis ([place])   . Urinary incontinence    mixed    Tobacco History: Social History   Tobacco Use  Smoking Status Former Smoker  . Last attempt to quit: 08/17/1983  . Years since quitting: 33.9  Smokeless Tobacco Never Used   Counseling given: Not Answered   Outpatient Encounter Medications as of 08/04/2017  Medication Sig  . albuterol (PROVENTIL HFA;VENTOLIN HFA) 108 (90 BASE) MCG/ACT inhaler Inhale 2 puffs into the lungs every 4 (four) hours as needed.  Marland Kitchen albuterol (PROVENTIL) (2.5 MG/3ML) 0.083% nebulizer solution Take 3 mLs (2.5 mg total) by nebulization every 6 (six) hours as needed for wheezing or shortness of breath.  Marland Kitchen aspirin EC 81 MG tablet Take 81 mg by mouth daily.  . cetirizine (ZYRTEC) 10 MG tablet Take 10 mg by mouth daily.  . Cholecalciferol (VITAMIN D-3) 1000 units CAPS Take by mouth.  . clobetasol cream (TEMOVATE) 6.23 % Apply 1 application topically 2 (two) times daily.  . fluticasone (FLONASE) 50 MCG/ACT nasal spray SHAKE WELL AND USE 2 SPRAYS IN EACH NOSTRIL EVERY DAY  . Fluticasone-Salmeterol (ADVAIR DISKUS) 250-50 MCG/DOSE AEPB Inhale 1 puff into the lungs 2 (two) times daily.  . magnesium oxide (MAG-OX) 400 MG tablet Take 400 mg daily by mouth.  . metoprolol succinate (TOPROL-XL) 50 MG 24 hr tablet TAKE 1 TABLET BY MOUTH EVERY DAY OR IMMEDIATELY FOLLOWING A MEAL  .  Multiple Vitamin (MULTIVITAMIN) tablet Take 1 tablet by mouth daily.  Marland Kitchen nystatin (MYCOSTATIN) 100000 UNIT/ML suspension SWISH AND SPIT 5 ML BY MOUTH THREE TIMES DAILY AS NEEDED.  Marland Kitchen ondansetron (ZOFRAN) 4 MG tablet TAKE 1 TABLET(4 MG) BY MOUTH TWICE DAILY AS NEEDED FOR NAUSEA OR VOMITING  . RABEprazole (ACIPHEX) 20 MG tablet Take 20 mg by mouth 2 (two) times daily.  Marland Kitchen rOPINIRole (REQUIP) 0.5 MG tablet Take 0.25mg  (1/2 tablet) nightly for one week then 0.5mg  (1 tablet) nightly  . rosuvastatin (CRESTOR) 5 MG tablet TAKE 1 TABLET BY MOUTH  EVERY DAY  . sucralfate (CARAFATE) 1 g tablet Take 1 g by mouth 3 (three) times daily.  . tamsulosin (FLOMAX) 0.4 MG CAPS capsule Take 1 capsule (0.4 mg total) by mouth daily.  Marland Kitchen venlafaxine XR (EFFEXOR-XR) 150 MG 24 hr capsule TAKE 1 CAPSULE BY MOUTH EVERY DAY WITH BREAKFAST   No facility-administered encounter medications on file as of 08/04/2017.      Review of Systems  Constitutional:   No  weight loss, night sweats,  Fevers, chills, fatigue, or  lassitude.  HEENT:   No headaches,  Difficulty swallowing,  Tooth/dental problems, or  Sore throat,                No sneezing, itching, ear ache,  +nasal congestion, post nasal drip,   CV:  No chest pain,  Orthopnea, PND, swelling in lower extremities, anasarca, dizziness, palpitations, syncope.   GI  No heartburn, indigestion, abdominal pain, nausea, vomiting, diarrhea, change in bowel habits, loss of appetite, bloody stools.   Resp:No chest [place] deformity  Skin: no rash or lesions.  GU: no dysuria, change in color of urine, no urgency or frequency.  No flank pain, no hematuria   MS:  No joint pain or swelling.  No decreased range of motion.  No back pain.    Physical Exam  Pulse 84   SpO2 99%   GEN: A/Ox3; pleasant , NAD, obese    HEENT:  [place]/AT,  EACs-clear, TMs-wnl, NOSE-clear, THROAT-clear, no lesions, no postnasal drip or exudate noted.   NECK:  Supple w/ fair ROM; no JVD; normal carotid impulses w/o bruits; no thyromegaly or nodules palpated; no lymphadenopathy.    RESP  Clear  P & A; w/o, wheezes/ rales/ or rhonchi. no accessory muscle use, no dullness to percussion  CARD:  RRR, no m/r/g, no peripheral edema, pulses intact, no cyanosis or clubbing.  GI:   Soft & nt; nml bowel sounds; no organomegaly or masses detected.   Musco: Warm bil, no deformities or joint swelling noted.   Neuro: alert, no focal deficits noted.    Skin: Warm, no lesions or rashes    Lab Results:  BMET  BNP No results found for:  BNP  ProBNP No results found for: PROBNP  Imaging: No results found.   Assessment & Plan:   GERD (gastroesophageal reflux disease) Cont on GERD tx . /PPI   Cough Recurrent cough - ? Mild asthma/RAD /UACS with AR/GERD triggers.  Check cxr today  Check CT sinus  Cont on trigger control  Change Advair to Symbicort ? DPI may be aggravating upper airway .  PFT showed normal lung function .   Plan  Patient Instructions  Set up CT sinus .  Change Advair to Symbicort 2 puffs Twice daily  , rinse after use.  Continue on Zyrtec and Nasacort .  Continue on Aciphex daily.  Follow up with Dr. Lake Bells in 4-6 weeks and  As needed   Please contact office for sooner follow up if symptoms do not improve or worsen or seek emergency care          Rexene Edison, NP 08/04/2017

## 2017-08-04 NOTE — Progress Notes (Signed)
Reviewed, agree 

## 2017-08-08 ENCOUNTER — Telehealth: Payer: Self-pay

## 2017-08-08 MED ORDER — ROSUVASTATIN CALCIUM 10 MG PO TABS: 10.0000 mg | ORAL_TABLET | Freq: Every day | ORAL | 1 refills | Status: DC

## 2017-08-08 NOTE — Telephone Encounter (Signed)
-----   Message from Einar Pheasant, MD sent at 08/08/2017  7:39 AM EST ----- Notify pt that her cholesterol has increased when compared to the last check.  Continue low cholesterol diet and exercise.  Also, confirm that she is taking crestor 5mg  q day.  If so, then increase to 10mg  q day.  Will need new rx sent in.  Overall sugar control increased some.  Diet and exercise.  We will follow.  Vitamin D level - low.  Confirm on vitamin D3 1000 units per day.  If so, then increase to 2000 units per day.  Kidney function stable.  Platelet count stable.  Still slightly increased, but stable.  Hgb, thyroid test and liver function tests wnl.

## 2017-08-15 ENCOUNTER — Ambulatory Visit
Admission: RE | Admit: 2017-08-15 | Discharge: 2017-08-15 | Disposition: A | Discharge status: Home / Self Care | Payer: BLUE CROSS/BLUE SHIELD | Source: Ambulatory Visit | Attending: Adult Health | Admitting: Adult Health

## 2017-08-15 ENCOUNTER — Other Ambulatory Visit: Payer: Self-pay | Admitting: Internal Medicine

## 2017-08-15 DIAGNOSIS — J323 Chronic sphenoidal sinusitis: Secondary | ICD-10-CM | POA: Insufficient documentation

## 2017-08-15 DIAGNOSIS — R05 Cough: Secondary | ICD-10-CM | POA: Diagnosis present

## 2017-08-15 DIAGNOSIS — R059 Cough, unspecified: Secondary | ICD-10-CM

## 2017-08-15 DIAGNOSIS — M2669 Other specified disorders of temporomandibular joint: Secondary | ICD-10-CM | POA: Insufficient documentation

## 2017-08-17 ENCOUNTER — Ambulatory Visit
Admission: RE | Admit: 2017-08-17 | Discharge: 2017-08-17 | Disposition: A | Discharge status: Home / Self Care | Payer: BLUE CROSS/BLUE SHIELD | Source: Ambulatory Visit | Attending: Internal Medicine | Admitting: Internal Medicine

## 2017-08-17 ENCOUNTER — Other Ambulatory Visit: Payer: Self-pay | Admitting: Internal Medicine

## 2017-08-17 DIAGNOSIS — R928 Other abnormal and inconclusive findings on diagnostic imaging of breast: Secondary | ICD-10-CM

## 2017-08-17 DIAGNOSIS — N6314 Unspecified lump in the right breast, lower inner quadrant: Secondary | ICD-10-CM | POA: Diagnosis not present

## 2017-08-17 DIAGNOSIS — N6312 Unspecified lump in the right breast, upper inner quadrant: Secondary | ICD-10-CM | POA: Insufficient documentation

## 2017-08-17 NOTE — Progress Notes (Signed)
Orders placed for right breast mammo and ultrasound - to be done in 6 months.

## 2017-08-22 ENCOUNTER — Other Ambulatory Visit: Payer: Self-pay | Admitting: Internal Medicine

## 2017-08-22 DIAGNOSIS — R928 Other abnormal and inconclusive findings on diagnostic imaging of breast: Secondary | ICD-10-CM

## 2017-08-22 NOTE — Progress Notes (Signed)
Order placed for f/u mammogram

## 2017-09-13 ENCOUNTER — Other Ambulatory Visit: Payer: Self-pay | Admitting: Internal Medicine

## 2017-09-14 ENCOUNTER — Encounter: Payer: Self-pay | Admitting: Pulmonary Disease

## 2017-09-14 ENCOUNTER — Telehealth: Payer: Self-pay | Admitting: Internal Medicine

## 2017-09-14 ENCOUNTER — Ambulatory Visit (INDEPENDENT_AMBULATORY_CARE_PROVIDER_SITE_OTHER): Payer: BLUE CROSS/BLUE SHIELD | Admitting: Pulmonary Disease

## 2017-09-14 VITALS — BP 134/78 | HR 101 | Ht 65.0 in | Wt 245.0 lb

## 2017-09-14 DIAGNOSIS — J3089 Other allergic rhinitis: Secondary | ICD-10-CM | POA: Diagnosis not present

## 2017-09-14 DIAGNOSIS — K219 Gastro-esophageal reflux disease without esophagitis: Secondary | ICD-10-CM | POA: Diagnosis not present

## 2017-09-14 DIAGNOSIS — R05 Cough: Secondary | ICD-10-CM | POA: Diagnosis not present

## 2017-09-14 DIAGNOSIS — R059 Cough, unspecified: Secondary | ICD-10-CM

## 2017-09-14 MED ORDER — AZELASTINE-FLUTICASONE 137-50 MCG/ACT NA SUSP: 1.0000 | NASAL | 0 refills | Status: DC

## 2017-09-14 MED ORDER — AZELASTINE-FLUTICASONE 137-50 MCG/ACT NA SUSP: 1.0000 | NASAL | 3 refills | Status: DC

## 2017-09-14 MED ORDER — MONTELUKAST SODIUM 10 MG PO TABS: 10.0000 mg | ORAL_TABLET | Freq: Every day | ORAL | 2 refills | Status: DC

## 2017-09-14 MED ORDER — BENZONATATE 200 MG PO CAPS: 200.0000 mg | ORAL_CAPSULE | Freq: Three times a day (TID) | ORAL | 2 refills | Status: DC | PRN

## 2017-09-14 NOTE — Patient Instructions (Signed)
Shortness of breath: This is multifactorial, but I do not think you have severe asthma by any means Stop taking Symbicort and see how you feel If you have recurrent shortness of breath after stopping Symbicort then go ahead and started back again  Fluid seen on the CT scan of your sinuses/chronic sinusitis: Add Montelukast Stop Flonase Start Dymista Continue Cetirizine If you do not have improved symptoms then let me know so I can refer you to ear nose and throat  Gastroesophageal reflux disease: Continue taking antacid therapy  We will see you back in 2 months or sooner

## 2017-09-14 NOTE — Progress Notes (Signed)
Subjective:   PATIENT ID: Nicole Sims GENDER: female DOB: April 13, 1951, MRN: [id number]  Synopsis: Referred in Dec 2018 for cough by Dr. Nicki Reaper  HPI  Chief Complaint  Patient presents with  . Follow-up    Pt is doing better with  symbicort vs advair. Pt has prod cough-clear   Cough: > at little better, still a daily occurrence with daily mucus production > mucus is typically clear  Sinus congestion: > feels some headache > has seen ENT in the past > had some allergy   Dyspnea: > some, not as bad as it was prior to being on the Advair   Past Medical History:  Diagnosis Date  . Anemia   . Anxiety   . Asthma   . Chronic headaches   . Diverticulosis   . Environmental allergies   . GERD (gastroesophageal reflux disease)   . Hypercholesterolemia   . Hyperglycemia   . Hypertension   . Osteoarthritis   . Palpitations   . Thrombocytosis ([place])   . Urinary incontinence    mixed        Review of Systems  Constitutional: Negative for chills, fever, malaise/fatigue and weight loss.  HENT: Negative for congestion, nosebleeds, sinus pain and sore throat.   Respiratory: Positive for cough, shortness of breath and wheezing. Negative for hemoptysis and sputum production.   Cardiovascular: Negative for chest pain, palpitations, orthopnea and leg swelling.  Skin: Negative for itching and rash.  Neurological: Negative for weakness.      Objective:  Physical Exam   Vitals:   09/14/17 1354  BP: 134/78  Pulse: (!) 101  SpO2: 99%  Weight: 245 lb (111.1 kg)  Height: 5\' 5"  (1.651 m)    Gen: obese but well appearing HENT: OP clear, TM's clear, neck supple PULM: CTA B, normal percussion CV: RRR, no mgr, trace edema GI: BS+, soft, nontender Derm: no cyanosis or rash Psyche: normal mood and affect   CBC    Component Value Date/Time   WBC 8.3 08/03/2017 0852   RBC 4.65 08/03/2017 0852   HGB 12.8 08/03/2017 0852   HGB 11.6 (L) 07/11/2013 0446   HCT 39.5  08/03/2017 0852   HCT 37.9 07/02/2013 1506   PLT 466.0 (H) 08/03/2017 0852   PLT 367 07/11/2013 0446   MCV 84.9 08/03/2017 0852   MCV 85 07/02/2013 1506   MCH 28.2 07/02/2013 1506   MCHC 32.5 08/03/2017 0852   RDW 14.8 08/03/2017 0852   RDW 14.3 07/02/2013 1506   LYMPHSABS 3.1 08/03/2017 0852   MONOABS 0.6 08/03/2017 0852   EOSABS 0.3 08/03/2017 0852   BASOSABS 0.1 08/03/2017 0852     Chest imaging: Chest x-ray May 2018 clear lungs.  Other imaging: 07/2017 CT sinus: 1. Mild chronic right sphenoid sinusitis. 2. Mild left temporomandibular joint degenerative changes with bony remodeling and a shallow mandibular fossa.  PFT: August 04, 2017 FEV1 98%, ratio 88, no significant bronchodilator response, TLC 85%, FVC 85%, DLCO 82%.   Exhaled nitric oxide testing: December 2018 8 ppm  Labs: 07/2017 IgE -6   Path:  Echo:  Heart Catheterization:   Records from her last visit with [place] reviewed where she was changed from Advair to Symbicort and was treated for gastroesophageal reflux disease    Assessment & Plan:   Cough  Gastroesophageal reflux disease without esophagitis  Allergic rhinitis due to other allergic trigger, unspecified seasonality  Discussion: 67 y/o female with GERD, allergic rhinitis and cough.  It is  not clear to me that she has evidence of asthma as she has normal lung function testing and exhaled nitric oxide.  All of her symptoms are likely explained by laryngeal sensitivity and postnasal drip.  Because of the fluid seen on the CT scan of her sinuses I referred her to ENT but she prefers to not be referred to any further doctors right now.  So even though I see little evidence of an allergic process I will maximize medical therapy for allergic rhinitis to see if this helps improve the postnasal drip.  If she sees no improvement with this and the cough and I think she needs to see ENT.  If ENT feels there is nothing wrong with her then we need to focus her  care on the [place] sensitivity with medicines like either Elavil or gabapentin.  Plan: Shortness of breath: This is multifactorial, but I do not think you have severe asthma by any means Stop taking Symbicort and see how you feel If you have recurrent shortness of breath after stopping Symbicort then go ahead and started back again  Fluid seen on the CT scan of your sinuses/chronic sinusitis: Add Montelukast Stop Flonase Start Dymista Continue Cetirizine If you do not have improved symptoms then let me know so I can refer you to ear nose and throat  Gastroesophageal reflux disease: Continue taking antacid therapy  We will see you back in 2 months or sooner  50% of this 27 minute visit spent face to face    Current Outpatient Medications:  .  albuterol (PROVENTIL HFA;VENTOLIN HFA) 108 (90 BASE) MCG/ACT inhaler, Inhale 2 puffs into the lungs every 4 (four) hours as needed., Disp: 1 Inhaler, Rfl: 3 .  albuterol (PROVENTIL) (2.5 MG/3ML) 0.083% nebulizer solution, Take 3 mLs (2.5 mg total) by nebulization every 6 (six) hours as needed for wheezing or shortness of breath., Disp: 150 mL, Rfl: 1 .  aspirin EC 81 MG tablet, Take 81 mg by mouth daily., Disp: , Rfl:  .  budesonide-formoterol (SYMBICORT) 80-4.5 MCG/ACT inhaler, Inhale 2 puffs into the lungs 2 (two) times daily., Disp: 1 Inhaler, Rfl: 3 .  cetirizine (ZYRTEC) 10 MG tablet, Take 10 mg by mouth daily., Disp: , Rfl:  .  Cholecalciferol (VITAMIN D-3) 1000 units CAPS, Take by mouth., Disp: , Rfl:  .  clobetasol cream (TEMOVATE) 6.76 %, Apply 1 application topically 2 (two) times daily., Disp: , Rfl:  .  fluticasone (FLONASE) 50 MCG/ACT nasal spray, SHAKE LIQUID AND USE 2 SPRAYS IN EACH NOSTRIL EVERY DAY, Disp: 16 g, Rfl: 1 .  magnesium oxide (MAG-OX) 400 MG tablet, Take 400 mg daily by mouth., Disp: , Rfl:  .  metoprolol succinate (TOPROL-XL) 50 MG 24 hr tablet, TAKE 1 TABLET BY MOUTH EVERY DAY OR IMMEDIATELY FOLLOWING A MEAL, Disp:  30 tablet, Rfl: 0 .  Multiple Vitamin (MULTIVITAMIN) tablet, Take 1 tablet by mouth daily., Disp: , Rfl:  .  ondansetron (ZOFRAN) 4 MG tablet, TAKE 1 TABLET(4 MG) BY MOUTH TWICE DAILY AS NEEDED FOR NAUSEA OR VOMITING, Disp: 20 tablet, Rfl: 0 .  RABEprazole (ACIPHEX) 20 MG tablet, Take 20 mg by mouth 2 (two) times daily., Disp: , Rfl:  .  rOPINIRole (REQUIP) 0.5 MG tablet, Take 0.25mg  (1/2 tablet) nightly for one week then 0.5mg  (1 tablet) nightly, Disp: , Rfl:  .  rosuvastatin (CRESTOR) 10 MG tablet, Take 1 tablet (10 mg total) by mouth daily., Disp: 90 tablet, Rfl: 1 .  sucralfate (CARAFATE) 1 g  tablet, Take 1 g by mouth 3 (three) times daily., Disp: , Rfl:  .  venlafaxine XR (EFFEXOR-XR) 150 MG 24 hr capsule, TAKE 1 CAPSULE BY MOUTH EVERY DAY WITH BREAKFAST, Disp: 30 capsule, Rfl: 0 .  Azelastine-Fluticasone (DYMISTA) 137-50 MCG/ACT SUSP, Place 1 spray into the nose 1 day or 1 dose for 1 dose., Disp: 1 Bottle, Rfl: 3 .  Azelastine-Fluticasone (DYMISTA) 137-50 MCG/ACT SUSP, Place 1 spray into the nose 1 day or 1 dose for 1 dose., Disp: 1 Bottle, Rfl: 0 .  benzonatate (TESSALON) 200 MG capsule, Take 1 capsule (200 mg total) by mouth 3 (three) times daily as needed for cough., Disp: 45 capsule, Rfl: 2 .  montelukast (SINGULAIR) 10 MG tablet, Take 1 tablet (10 mg total) by mouth at bedtime., Disp: 30 tablet, Rfl: 2

## 2017-09-14 NOTE — Telephone Encounter (Signed)
Copied from [place] #[id number]. Topic: Quick Communication - See Telephone Encounter >> Sep 14, 2017  4:43 PM Vernona Rieger wrote: CRM for notification. See Telephone encounter for:   09/14/17.   Patient said that her insurance wont cover the  Azelastine-Fluticasone (DYMISTA) 137-50 MCG/ACT SUSP .  Please advise  Call back is [phone]

## 2017-09-15 ENCOUNTER — Telehealth: Payer: Self-pay | Admitting: Pulmonary Disease

## 2017-09-15 NOTE — Telephone Encounter (Signed)
They usually will cover the two separate nasal sprays.  dymista is a combined medication.  We can send in astelin nasal spray and flonase nasal spray and I think this will be cheaper.  Can send in if she is ok with this.

## 2017-09-15 NOTE — Telephone Encounter (Signed)
Is there something else we can send in?

## 2017-09-15 NOTE — Telephone Encounter (Signed)
Dr. Lake Bells, please advise on an alternative nasal spray instead of Dymista for pt.  Pt states she is going off of her BCBS today and will be on medicare and supplemental insurance beginning tomorrow but does not know the other number for the supplemental insurance due to just being put on it.

## 2017-09-15 NOTE — Telephone Encounter (Signed)
Disregard. This script was written by Dr.McQuaid. Gave patient number for [facility] in [place]

## 2017-09-20 MED ORDER — AZELASTINE HCL 0.1 % NA SOLN: 1.0000 | Freq: Two times a day (BID) | NASAL | 5 refills | Status: DC

## 2017-09-20 NOTE — Telephone Encounter (Signed)
OTC flonase AND Astelin 1 sprays each nostril bid

## 2017-09-20 NOTE — Telephone Encounter (Signed)
Called pt and advised message from the provider. Pt understood and verbalized understanding. Nothing further is needed.  Rx sent in for Astelin per pt request.

## 2017-09-22 ENCOUNTER — Telehealth: Payer: Self-pay | Admitting: Pulmonary Disease

## 2017-09-22 NOTE — Telephone Encounter (Signed)
Received a PA on pt's Dymista.  PA initiated via covermymeds on 09/22/17.  Key: [id number]

## 2017-09-23 NOTE — Telephone Encounter (Signed)
Checked status of PA, states that PA was cancelled d/t pt's insurance coverage ending on 09/15/17.   lmtcb X1 to obtain current rx insurance info.

## 2017-09-29 NOTE — Telephone Encounter (Signed)
Called and spoke with pt and she stated that we called in astelin nasal spray for her to replace the dymista.  She stated that nothing further was needed at this time

## 2017-09-30 ENCOUNTER — Encounter: Payer: Self-pay | Admitting: Internal Medicine

## 2017-09-30 ENCOUNTER — Ambulatory Visit (INDEPENDENT_AMBULATORY_CARE_PROVIDER_SITE_OTHER): Payer: Medicare Other | Admitting: Internal Medicine

## 2017-09-30 DIAGNOSIS — R739 Hyperglycemia, unspecified: Secondary | ICD-10-CM | POA: Diagnosis not present

## 2017-09-30 DIAGNOSIS — R519 Headache, unspecified: Secondary | ICD-10-CM

## 2017-09-30 DIAGNOSIS — R05 Cough: Secondary | ICD-10-CM | POA: Diagnosis not present

## 2017-09-30 DIAGNOSIS — D473 Essential (hemorrhagic) thrombocythemia: Secondary | ICD-10-CM

## 2017-09-30 DIAGNOSIS — R51 Headache: Secondary | ICD-10-CM

## 2017-09-30 DIAGNOSIS — K219 Gastro-esophageal reflux disease without esophagitis: Secondary | ICD-10-CM

## 2017-09-30 DIAGNOSIS — D649 Anemia, unspecified: Secondary | ICD-10-CM | POA: Diagnosis not present

## 2017-09-30 DIAGNOSIS — E559 Vitamin D deficiency, unspecified: Secondary | ICD-10-CM

## 2017-09-30 DIAGNOSIS — Z6841 Body Mass Index (BMI) 40.0 and over, adult: Secondary | ICD-10-CM | POA: Diagnosis not present

## 2017-09-30 DIAGNOSIS — E7800 Pure hypercholesterolemia, unspecified: Secondary | ICD-10-CM

## 2017-09-30 DIAGNOSIS — Z23 Encounter for immunization: Secondary | ICD-10-CM

## 2017-09-30 DIAGNOSIS — F439 Reaction to severe stress, unspecified: Secondary | ICD-10-CM

## 2017-09-30 DIAGNOSIS — I10 Essential (primary) hypertension: Secondary | ICD-10-CM | POA: Diagnosis not present

## 2017-09-30 DIAGNOSIS — R059 Cough, unspecified: Secondary | ICD-10-CM

## 2017-09-30 DIAGNOSIS — L900 Lichen sclerosus et atrophicus: Secondary | ICD-10-CM

## 2017-09-30 DIAGNOSIS — D75839 Thrombocytosis, unspecified: Secondary | ICD-10-CM

## 2017-09-30 MED ORDER — VENLAFAXINE HCL ER 150 MG PO CP24: ORAL_CAPSULE | ORAL | 1 refills | Status: DC

## 2017-09-30 MED ORDER — AZELASTINE HCL 0.1 % NA SOLN: 1.0000 | Freq: Two times a day (BID) | NASAL | 1 refills | Status: DC

## 2017-09-30 MED ORDER — ROSUVASTATIN CALCIUM 10 MG PO TABS: 10.0000 mg | ORAL_TABLET | Freq: Every day | ORAL | 1 refills | Status: DC

## 2017-09-30 MED ORDER — MONTELUKAST SODIUM 10 MG PO TABS: 10.0000 mg | ORAL_TABLET | Freq: Every day | ORAL | 1 refills | Status: DC

## 2017-09-30 MED ORDER — METOPROLOL SUCCINATE ER 50 MG PO TB24: ORAL_TABLET | ORAL | 1 refills | Status: DC

## 2017-09-30 NOTE — Progress Notes (Signed)
Patient ID: Nicole Sims, female   DOB: May 05, 1951, 67 y.o.   MRN: [id number]   Subjective:    Patient ID: Nicole Sims, female    DOB: 29-Jan-1951, 67 y.o.   MRN: [id number]  HPI  Patient here for a scheduled follow up.  She reports she is doing relatively well.  Feels some better.  Saw Dr Lake Bells 09/14/17.  CXR 12/2016 - lungs clear.  Normal lung function testing.  Was instructed to stop symbicort.  singulair added.  Also started on astelin and continued on flonase.  She feels her breathing is better.  Using CPAP.  No chest pain.  Acid reflux controlled.  No abdominal pain.  Bowels moving.  sheis seeing Dr Sharlet Salina for her neck pain.  Planning for C4-5 and C5-6 facet joint injection.  She also is seeing ortho for trochanteric bursitis.  S/p injection.  Plans to f/u back up with them for persistent issues.  Increased stress.  Overall handling stress relatively well.     Past Medical History:  Diagnosis Date  . Anemia   . Anxiety   . Asthma   . Chronic headaches   . Diverticulosis   . Environmental allergies   . GERD (gastroesophageal reflux disease)   . Hypercholesterolemia   . Hyperglycemia   . Hypertension   . Osteoarthritis   . Palpitations   . Thrombocytosis ([place])   . Urinary incontinence    mixed   Past Surgical History:  Procedure Laterality Date  . BLADDER SURGERY     x2   [facility]  . BREAST CYST ASPIRATION Bilateral 2005   approximate year  . CERVICAL CONE BIOPSY     CIS  . CHOLECYSTECTOMY    . COLONOSCOPY WITH PROPOFOL N/A 07/19/2016   Procedure: COLONOSCOPY WITH PROPOFOL;  Surgeon: Manya Silvas, MD;  Location: [facility] ENDOSCOPY;  Service: Endoscopy;  Laterality: N/A;  . ESOPHAGOGASTRODUODENOSCOPY (EGD) WITH PROPOFOL N/A 07/19/2016   Procedure: ESOPHAGOGASTRODUODENOSCOPY (EGD) WITH PROPOFOL;  Surgeon: Manya Silvas, MD;  Location: [facility] ENDOSCOPY;  Service: Endoscopy;  Laterality: N/A;  . KNEE ARTHROSCOPY  08/13/08  . knee replacement and revision     left  . RECTOCELE REPAIR    . ROTATOR CUFF REPAIR     bilateral  . SHOULDER SURGERY  11/17/05  . TONSILLECTOMY  1962  . VAGINAL HYSTERECTOMY  1974   abnormal pap and carcinoma in situ   Family History  Problem Relation Age of Onset  . Diabetes Mellitus II Father   . Thyroid disease Father   . Breast cancer Maternal Aunt   . Colon cancer Neg Hx   . Kidney cancer Neg Hx   . Bladder Cancer Neg Hx    Social History   Socioeconomic History  . Marital status: Married    Spouse name: None  . Number of children: None  . Years of education: None  . Highest education level: None  Social Needs  . Financial resource strain: None  . Food insecurity - worry: None  . Food insecurity - inability: None  . Transportation needs - medical: None  . Transportation needs - non-medical: None  Occupational History  . None  Tobacco Use  . Smoking status: Former Smoker    Last attempt to quit: 08/17/1983    Years since quitting: 34.1  . Smokeless tobacco: Never Used  Substance and Sexual Activity  . Alcohol use: Yes    Alcohol/week: 0.0 oz    Comment: rarely  . Drug use: No  .  Sexual activity: None  Other Topics Concern  . None  Social History Narrative  . None    Outpatient Encounter Medications as of 09/30/2017  Medication Sig  . albuterol (PROVENTIL HFA;VENTOLIN HFA) 108 (90 BASE) MCG/ACT inhaler Inhale 2 puffs into the lungs every 4 (four) hours as needed.  Marland Kitchen albuterol (PROVENTIL) (2.5 MG/3ML) 0.083% nebulizer solution Take 3 mLs (2.5 mg total) by nebulization every 6 (six) hours as needed for wheezing or shortness of breath.  Marland Kitchen aspirin EC 81 MG tablet Take 81 mg by mouth daily.  Marland Kitchen azelastine (ASTELIN) 0.1 % nasal spray Place 1 spray into both nostrils 2 (two) times daily. Use in each nostril as directed  . benzonatate (TESSALON) 200 MG capsule Take 1 capsule (200 mg total) by mouth 3 (three) times daily as needed for cough.  . budesonide-formoterol (SYMBICORT) 80-4.5 MCG/ACT inhaler  Inhale 2 puffs into the lungs 2 (two) times daily.  . cetirizine (ZYRTEC) 10 MG tablet Take 10 mg by mouth daily.  . Cholecalciferol (VITAMIN D-3) 1000 units CAPS Take by mouth.  . clobetasol cream (TEMOVATE) 6.64 % Apply 1 application topically 2 (two) times daily.  . fluticasone (FLONASE) 50 MCG/ACT nasal spray SHAKE LIQUID AND USE 2 SPRAYS IN EACH NOSTRIL EVERY DAY  . magnesium oxide (MAG-OX) 400 MG tablet Take 400 mg daily by mouth.  . metoprolol succinate (TOPROL-XL) 50 MG 24 hr tablet TAKE 1 TABLET BY MOUTH EVERY DAY OR IMMEDIATELY FOLLOWING A MEAL  . montelukast (SINGULAIR) 10 MG tablet Take 1 tablet (10 mg total) by mouth at bedtime.  . Multiple Vitamin (MULTIVITAMIN) tablet Take 1 tablet by mouth daily.  . ondansetron (ZOFRAN) 4 MG tablet TAKE 1 TABLET(4 MG) BY MOUTH TWICE DAILY AS NEEDED FOR NAUSEA OR VOMITING  . RABEprazole (ACIPHEX) 20 MG tablet Take 20 mg by mouth 2 (two) times daily.  Marland Kitchen rOPINIRole (REQUIP) 0.5 MG tablet Take 0.'25mg'$  (1/2 tablet) nightly for one week then 0.'5mg'$  (1 tablet) nightly  . rosuvastatin (CRESTOR) 10 MG tablet Take 1 tablet (10 mg total) by mouth daily.  . sucralfate (CARAFATE) 1 g tablet Take 1 g by mouth 3 (three) times daily.  Marland Kitchen venlafaxine XR (EFFEXOR-XR) 150 MG 24 hr capsule TAKE 1 CAPSULE BY MOUTH EVERY DAY WITH BREAKFAST  . [DISCONTINUED] azelastine (ASTELIN) 0.1 % nasal spray Place 1 spray into both nostrils 2 (two) times daily. Use in each nostril as directed  . [DISCONTINUED] metoprolol succinate (TOPROL-XL) 50 MG 24 hr tablet TAKE 1 TABLET BY MOUTH EVERY DAY OR IMMEDIATELY FOLLOWING A MEAL  . [DISCONTINUED] montelukast (SINGULAIR) 10 MG tablet Take 1 tablet (10 mg total) by mouth at bedtime.  . [DISCONTINUED] rosuvastatin (CRESTOR) 10 MG tablet Take 1 tablet (10 mg total) by mouth daily.  . [DISCONTINUED] venlafaxine XR (EFFEXOR-XR) 150 MG 24 hr capsule TAKE 1 CAPSULE BY MOUTH EVERY DAY WITH BREAKFAST  . [DISCONTINUED] Azelastine-Fluticasone  (DYMISTA) 137-50 MCG/ACT SUSP Place 1 spray into the nose 1 day or 1 dose for 1 dose.  . [DISCONTINUED] Azelastine-Fluticasone (DYMISTA) 137-50 MCG/ACT SUSP Place 1 spray into the nose 1 day or 1 dose for 1 dose.   No facility-administered encounter medications on file as of 09/30/2017.     Review of Systems  Constitutional: Negative for appetite change and unexpected weight change.  HENT: Positive for congestion and postnasal drip. Negative for sinus pressure.   Respiratory: Negative for chest tightness.        Breathing better.  Cough seems to be better.  Cardiovascular: Negative for chest pain, palpitations and leg swelling.  Gastrointestinal: Negative for abdominal pain, diarrhea, nausea and vomiting.  Genitourinary: Negative for difficulty urinating and dysuria.  Musculoskeletal:       Neck pain as outlined.  Right lateral hip and leg pain.   Skin: Negative for color change and rash.  Neurological: Negative for dizziness and light-headedness.  Psychiatric/Behavioral: Negative for agitation and dysphoric mood.       Objective:     Blood pressure rechecked by me:  128/84  Physical Exam  Constitutional: She appears well-developed and well-nourished. No distress.  HENT:  Nose: Nose normal.  Mouth/Throat: Oropharynx is clear and moist.  Neck: Neck supple. No thyromegaly present.  Cardiovascular: Normal rate and regular rhythm.  Pulmonary/Chest: Breath sounds normal. No respiratory distress. She has no wheezes.  Abdominal: Soft. Bowel sounds are normal. There is no tenderness.  Musculoskeletal: She exhibits no edema or tenderness.  Lymphadenopathy:    She has no cervical adenopathy.  Skin: No rash noted. No erythema.  Psychiatric: She has a normal mood and affect. Her behavior is normal.    BP 128/84   Pulse 67   Temp 98.1 F (36.7 C) (Oral)   Resp 18   Wt 249 lb (112.9 kg)   SpO2 98%   BMI 41.44 kg/m  Wt Readings from Last 3 Encounters:  09/30/17 249 lb (112.9 kg)    09/14/17 245 lb (111.1 kg)  08/04/17 248 lb (112.5 kg)     Lab Results  Component Value Date   WBC 8.3 08/03/2017   HGB 12.8 08/03/2017   HCT 39.5 08/03/2017   PLT 466.0 (H) 08/03/2017   GLUCOSE 104 (H) 08/03/2017   CHOL 206 (H) 08/03/2017   TRIG 138.0 08/03/2017   HDL 54.50 08/03/2017   LDLDIRECT 172.8 09/17/2013   LDLCALC 124 (H) 08/03/2017   ALT 15 08/03/2017   AST 11 08/03/2017   NA 143 08/03/2017   K 4.3 08/03/2017   CL 107 08/03/2017   CREATININE 1.03 08/03/2017   BUN 13 08/03/2017   CO2 27 08/03/2017   TSH 2.17 08/03/2017   INR 0.9 07/02/2013   HGBA1C 6.2 08/03/2017   MICROALBUR 1.1 04/28/2015    US Breast Ltd Uni Right Inc Axilla  Result Date: 08/17/2017 CLINICAL DATA:  Right breast 9 o'clock probably benign asymmetry follow-up. EXAM: 2D DIGITAL DIAGNOSTIC BILATERAL MAMMOGRAM WITH CAD AND ADJUNCT TOMO ULTRASOUND RIGHT BREAST COMPARISON:  Previous exam(s). ACR Breast Density Category c: The breast tissue is heterogeneously dense, which may obscure small masses. FINDINGS: Mammographically, there are no suspicious masses, areas of architectural distortion or microcalcifications in the left breast. There is a stable focal asymmetry in the right outer central breast, middle depth, seen better on the craniocaudal view. Mammographic images were processed with CAD. On physical exam, no suspicious masses are palpated. Targeted ultrasound is performed, showing right breast 9 o'clock 4 cm from the nipple benign-appearing hypoechoic circumscribed horizontally oriented nodule which measures 0.6 x 0.7 x 0.3 cm. In the right breast 9 o'clock 2 cm from nipple, there is a similarly appearing hypoechoic nodule measuring 0.6 x 0.4 x 0.6 cm. These findings likely correspond to the focal asymmetry seen mammographically. IMPRESSION: Stable right breast 9 o'clock benign-appearing nodules, for which continued short-term follow-up is recommended. RECOMMENDATION: Diagnostic mammogram and possibly  ultrasound of the right breast in 6 months. (Code:DM-R-71M) I have discussed the findings and recommendations with the patient. Results were also provided in writing at the conclusion of the visit.  If applicable, a reminder letter will be sent to the patient regarding the next appointment. BI-RADS CATEGORY  3: Probably benign. Electronically Signed   By: Fidela Salisbury M.D.   On: 08/17/2017 11:16   Mm Diag Breast Tomo Bilateral  Result Date: 08/17/2017 CLINICAL DATA:  Right breast 9 o'clock probably benign asymmetry follow-up. EXAM: 2D DIGITAL DIAGNOSTIC BILATERAL MAMMOGRAM WITH CAD AND ADJUNCT TOMO ULTRASOUND RIGHT BREAST COMPARISON:  Previous exam(s). ACR Breast Density Category c: The breast tissue is heterogeneously dense, which may obscure small masses. FINDINGS: Mammographically, there are no suspicious masses, areas of architectural distortion or microcalcifications in the left breast. There is a stable focal asymmetry in the right outer central breast, middle depth, seen better on the craniocaudal view. Mammographic images were processed with CAD. On physical exam, no suspicious masses are palpated. Targeted ultrasound is performed, showing right breast 9 o'clock 4 cm from the nipple benign-appearing hypoechoic circumscribed horizontally oriented nodule which measures 0.6 x 0.7 x 0.3 cm. In the right breast 9 o'clock 2 cm from nipple, there is a similarly appearing hypoechoic nodule measuring 0.6 x 0.4 x 0.6 cm. These findings likely correspond to the focal asymmetry seen mammographically. IMPRESSION: Stable right breast 9 o'clock benign-appearing nodules, for which continued short-term follow-up is recommended. RECOMMENDATION: Diagnostic mammogram and possibly ultrasound of the right breast in 6 months. (Code:DM-R-63M) I have discussed the findings and recommendations with the patient. Results were also provided in writing at the conclusion of the visit. If applicable, a reminder letter will be sent  to the patient regarding the next appointment. BI-RADS CATEGORY  3: Probably benign. Electronically Signed   By: Fidela Salisbury M.D.   On: 08/17/2017 11:16       Assessment & Plan:   Problem List Items Addressed This Visit    Anemia    Has been evaluated by Dr Ma Hillock.  Follow cbc.       BMI 40.0-44.9, adult (HCC)    Discussed diet and exercise.  Follow.       Chronic headaches    Followed by neurology.        Relevant Medications   venlafaxine XR (EFFEXOR-XR) 150 MG 24 hr capsule   metoprolol succinate (TOPROL-XL) 50 MG 24 hr tablet   Cough    Saw Dr Lake Bells.  Note reviewed.  Off symbicort.  On singulair, astelin, flonase and zyrtec.  Breathing better.  Cough appears to be better.  Continue f/u with pulmonary.        GERD (gastroesophageal reflux disease)    Controlled on current regimen.  Follow.        Hypercholesterolemia    On crestor.  Low cholesterol diet and exercise.  Follow lipid panel and liver function tests.        Relevant Medications   rosuvastatin (CRESTOR) 10 MG tablet   metoprolol succinate (TOPROL-XL) 50 MG 24 hr tablet   Other Relevant Orders   Hepatic function panel   Lipid panel   Hyperglycemia    Low carb diet and exercise.  Follow met b and a1c.        Relevant Orders   Hemoglobin A1c   Hypertension    Blood pressure under good control.  Continue same medication regimen.  Follow pressures.  Follow metabolic panel.        Relevant Medications   rosuvastatin (CRESTOR) 10 MG tablet   metoprolol succinate (TOPROL-XL) 50 MG 24 hr tablet   Other Relevant Orders   Basic metabolic panel  Lichen sclerosus    Has been followed by gyn.       Stress    Overall handling things relatively well. Follow.        Thrombocytosis ([place])    Follow cbc.       Relevant Orders   CBC with Differential/Platelet   Vitamin D deficiency    Follow vitamin D level.         Other Visit Diagnoses    Encounter for immunization       Relevant  Orders   Flu vaccine HIGH DOSE PF (Completed)       Einar Pheasant, MD

## 2017-10-03 ENCOUNTER — Encounter: Payer: Self-pay | Admitting: Internal Medicine

## 2017-10-03 NOTE — Assessment & Plan Note (Signed)
Overall handling things relatively well.  Follow.  

## 2017-10-03 NOTE — Assessment & Plan Note (Signed)
Blood pressure under good control.  Continue same medication regimen.  Follow pressures.  Follow metabolic panel.   

## 2017-10-03 NOTE — Assessment & Plan Note (Signed)
Saw Dr Lake Bells.  Note reviewed.  Off symbicort.  On singulair, astelin, flonase and zyrtec.  Breathing better.  Cough appears to be better.  Continue f/u with pulmonary.

## 2017-10-03 NOTE — Assessment & Plan Note (Signed)
Has been followed by gyn.   

## 2017-10-03 NOTE — Assessment & Plan Note (Signed)
Discussed diet and exercise.  Follow.  

## 2017-10-03 NOTE — Assessment & Plan Note (Signed)
Low carb diet and exercise.  Follow met b and a1c.   

## 2017-10-03 NOTE — Assessment & Plan Note (Signed)
Follow cbc.  

## 2017-10-03 NOTE — Assessment & Plan Note (Signed)
Followed by neurology.   

## 2017-10-03 NOTE — Assessment & Plan Note (Signed)
Controlled on current regimen.  Follow.  

## 2017-10-03 NOTE — Assessment & Plan Note (Signed)
Has been evaluated by Dr Pandit.  Follow cbc.   

## 2017-10-03 NOTE — Assessment & Plan Note (Signed)
Follow vitamin D level.  

## 2017-10-03 NOTE — Assessment & Plan Note (Signed)
On crestor.  Low cholesterol diet and exercise.  Follow lipid panel and liver function tests.   

## 2017-10-07 ENCOUNTER — Other Ambulatory Visit: Payer: Self-pay

## 2017-10-07 MED ORDER — FLUTICASONE PROPIONATE 50 MCG/ACT NA SUSP: NASAL | 6 refills | Status: DC

## 2017-10-12 ENCOUNTER — Telehealth: Payer: Self-pay | Admitting: Pulmonary Disease

## 2017-10-12 DIAGNOSIS — J329 Chronic sinusitis, unspecified: Secondary | ICD-10-CM

## 2017-10-12 NOTE — Telephone Encounter (Signed)
Spoke with patient. She is still having issues with fluid in her sinuses.   She is requesting a referral to [facility].   BQ, please advise if you are ok with this. Thanks!

## 2017-10-12 NOTE — Telephone Encounter (Signed)
OK by me 

## 2017-10-12 NOTE — Telephone Encounter (Signed)
Referral placed. Pt aware. Nothing further needed. 

## 2017-10-13 ENCOUNTER — Other Ambulatory Visit: Payer: Self-pay | Admitting: Student

## 2017-10-13 DIAGNOSIS — M7061 Trochanteric bursitis, right hip: Secondary | ICD-10-CM

## 2017-10-26 ENCOUNTER — Ambulatory Visit
Admission: RE | Admit: 2017-10-26 | Discharge: 2017-10-26 | Disposition: A | Discharge status: Home / Self Care | Payer: Medicare Other | Source: Ambulatory Visit | Attending: Student | Admitting: Student

## 2017-10-26 DIAGNOSIS — M7611 Psoas tendinitis, right hip: Secondary | ICD-10-CM | POA: Insufficient documentation

## 2017-10-26 DIAGNOSIS — M7061 Trochanteric bursitis, right hip: Secondary | ICD-10-CM | POA: Diagnosis present

## 2017-11-01 DIAGNOSIS — G8929 Other chronic pain: Secondary | ICD-10-CM | POA: Diagnosis not present

## 2017-11-01 DIAGNOSIS — M5441 Lumbago with sciatica, right side: Secondary | ICD-10-CM | POA: Diagnosis not present

## 2017-11-01 DIAGNOSIS — M79651 Pain in right thigh: Secondary | ICD-10-CM | POA: Diagnosis not present

## 2017-11-03 DIAGNOSIS — M5416 Radiculopathy, lumbar region: Secondary | ICD-10-CM | POA: Diagnosis not present

## 2017-11-03 DIAGNOSIS — M5136 Other intervertebral disc degeneration, lumbar region: Secondary | ICD-10-CM | POA: Diagnosis not present

## 2017-11-10 DIAGNOSIS — E559 Vitamin D deficiency, unspecified: Secondary | ICD-10-CM | POA: Diagnosis not present

## 2017-11-10 DIAGNOSIS — E538 Deficiency of other specified B group vitamins: Secondary | ICD-10-CM | POA: Diagnosis not present

## 2017-11-10 DIAGNOSIS — G2581 Restless legs syndrome: Secondary | ICD-10-CM | POA: Diagnosis not present

## 2017-11-10 DIAGNOSIS — G5603 Carpal tunnel syndrome, bilateral upper limbs: Secondary | ICD-10-CM | POA: Diagnosis not present

## 2017-11-10 DIAGNOSIS — G4733 Obstructive sleep apnea (adult) (pediatric): Secondary | ICD-10-CM | POA: Diagnosis not present

## 2017-11-11 ENCOUNTER — Other Ambulatory Visit: Payer: Self-pay | Admitting: Internal Medicine

## 2017-11-18 ENCOUNTER — Ambulatory Visit: Payer: BLUE CROSS/BLUE SHIELD | Admitting: Pulmonary Disease

## 2017-11-29 DIAGNOSIS — I10 Essential (primary) hypertension: Secondary | ICD-10-CM | POA: Diagnosis not present

## 2017-11-29 DIAGNOSIS — E782 Mixed hyperlipidemia: Secondary | ICD-10-CM | POA: Diagnosis not present

## 2017-11-29 DIAGNOSIS — G4733 Obstructive sleep apnea (adult) (pediatric): Secondary | ICD-10-CM | POA: Diagnosis not present

## 2017-11-29 DIAGNOSIS — I208 Other forms of angina pectoris: Secondary | ICD-10-CM | POA: Diagnosis not present

## 2017-11-29 DIAGNOSIS — R0789 Other chest pain: Secondary | ICD-10-CM | POA: Diagnosis not present

## 2017-12-05 DIAGNOSIS — R002 Palpitations: Secondary | ICD-10-CM | POA: Diagnosis not present

## 2017-12-05 DIAGNOSIS — I208 Other forms of angina pectoris: Secondary | ICD-10-CM | POA: Diagnosis not present

## 2017-12-05 DIAGNOSIS — R079 Chest pain, unspecified: Secondary | ICD-10-CM | POA: Diagnosis not present

## 2017-12-09 DIAGNOSIS — M5416 Radiculopathy, lumbar region: Secondary | ICD-10-CM | POA: Diagnosis not present

## 2017-12-09 DIAGNOSIS — M5136 Other intervertebral disc degeneration, lumbar region: Secondary | ICD-10-CM | POA: Diagnosis not present

## 2017-12-09 DIAGNOSIS — M47812 Spondylosis without myelopathy or radiculopathy, cervical region: Secondary | ICD-10-CM | POA: Diagnosis not present

## 2017-12-09 DIAGNOSIS — M62838 Other muscle spasm: Secondary | ICD-10-CM | POA: Diagnosis not present

## 2017-12-09 DIAGNOSIS — M5030 Other cervical disc degeneration, unspecified cervical region: Secondary | ICD-10-CM | POA: Diagnosis not present

## 2017-12-14 DIAGNOSIS — I208 Other forms of angina pectoris: Secondary | ICD-10-CM | POA: Diagnosis not present

## 2017-12-20 DIAGNOSIS — G4733 Obstructive sleep apnea (adult) (pediatric): Secondary | ICD-10-CM | POA: Diagnosis not present

## 2017-12-20 DIAGNOSIS — I10 Essential (primary) hypertension: Secondary | ICD-10-CM | POA: Diagnosis not present

## 2017-12-20 DIAGNOSIS — I872 Venous insufficiency (chronic) (peripheral): Secondary | ICD-10-CM | POA: Diagnosis not present

## 2017-12-27 ENCOUNTER — Other Ambulatory Visit (INDEPENDENT_AMBULATORY_CARE_PROVIDER_SITE_OTHER): Payer: Medicare Other

## 2017-12-27 DIAGNOSIS — R739 Hyperglycemia, unspecified: Secondary | ICD-10-CM

## 2017-12-27 DIAGNOSIS — D473 Essential (hemorrhagic) thrombocythemia: Secondary | ICD-10-CM

## 2017-12-27 DIAGNOSIS — I10 Essential (primary) hypertension: Secondary | ICD-10-CM

## 2017-12-27 DIAGNOSIS — E7800 Pure hypercholesterolemia, unspecified: Secondary | ICD-10-CM

## 2017-12-27 DIAGNOSIS — D75839 Thrombocytosis, unspecified: Secondary | ICD-10-CM

## 2017-12-27 LAB — LIPID PANEL
CHOLESTEROL: 171 mg/dL (ref 0–200)
HDL: 55.1 mg/dL (ref >39.00)
LDL CALC: 94 mg/dL (ref 0–99)
NONHDL: 116.09
Total CHOL/HDL Ratio: 3
Triglycerides: 110 mg/dL (ref 0.0–149.0)
VLDL: 22 mg/dL (ref 0.0–40.0)

## 2017-12-27 LAB — CBC WITH DIFFERENTIAL/PLATELET
BASOS ABS: 0.1 10*3/uL (ref 0.0–0.1)
Basophils Relative: 0.8 % (ref 0.0–3.0)
EOS PCT: 2.2 % (ref 0.0–5.0)
Eosinophils Absolute: 0.2 10*3/uL (ref 0.0–0.7)
HCT: 37.7 % (ref 36.0–46.0)
HEMOGLOBIN: 12.5 g/dL (ref 12.0–15.0)
Lymphocytes Relative: 42.8 % (ref 12.0–46.0)
Lymphs Abs: 3.8 10*3/uL (ref 0.7–4.0)
MCHC: 33 g/dL (ref 30.0–36.0)
MCV: 84.4 fl (ref 78.0–100.0)
MONO ABS: 0.7 10*3/uL (ref 0.1–1.0)
Monocytes Relative: 8.1 % (ref 3.0–12.0)
Neutro Abs: 4.1 10*3/uL (ref 1.4–7.7)
Neutrophils Relative %: 46.1 % (ref 43.0–77.0)
Platelets: 476 10*3/uL — ABNORMAL HIGH (ref 150.0–400.0)
RBC: 4.47 Mil/uL (ref 3.87–5.11)
RDW: 14.3 % (ref 11.5–15.5)
WBC: 8.9 10*3/uL (ref 4.0–10.5)

## 2017-12-27 LAB — BASIC METABOLIC PANEL
BUN: 14 mg/dL (ref 6–23)
CHLORIDE: 107 meq/L (ref 96–112)
CO2: 27 mEq/L (ref 19–32)
Calcium: 9.4 mg/dL (ref 8.4–10.5)
Creatinine, Ser: 0.96 mg/dL (ref 0.40–1.20)
GFR: 61.49 mL/min (ref >60.00)
GLUCOSE: 111 mg/dL — AB (ref 70–99)
POTASSIUM: 4.4 meq/L (ref 3.5–5.1)
Sodium: 143 mEq/L (ref 135–145)

## 2017-12-27 LAB — HEPATIC FUNCTION PANEL
ALT: 16 U/L (ref 0–35)
AST: 16 U/L (ref 0–37)
Albumin: 4 g/dL (ref 3.5–5.2)
Alkaline Phosphatase: 85 U/L (ref 39–117)
Bilirubin, Direct: 0.1 mg/dL (ref 0.0–0.3)
Total Bilirubin: 0.4 mg/dL (ref 0.2–1.2)
Total Protein: 7.7 g/dL (ref 6.0–8.3)

## 2017-12-27 LAB — HEMOGLOBIN A1C: HEMOGLOBIN A1C: 6 % (ref 4.6–6.5)

## 2017-12-28 ENCOUNTER — Encounter: Payer: Self-pay | Admitting: Internal Medicine

## 2017-12-29 ENCOUNTER — Ambulatory Visit (INDEPENDENT_AMBULATORY_CARE_PROVIDER_SITE_OTHER): Payer: Medicare Other | Admitting: Internal Medicine

## 2017-12-29 ENCOUNTER — Encounter: Payer: Self-pay | Admitting: Internal Medicine

## 2017-12-29 DIAGNOSIS — F439 Reaction to severe stress, unspecified: Secondary | ICD-10-CM

## 2017-12-29 DIAGNOSIS — Z6841 Body Mass Index (BMI) 40.0 and over, adult: Secondary | ICD-10-CM

## 2017-12-29 DIAGNOSIS — R002 Palpitations: Secondary | ICD-10-CM | POA: Diagnosis not present

## 2017-12-29 DIAGNOSIS — D75839 Thrombocytosis, unspecified: Secondary | ICD-10-CM

## 2017-12-29 DIAGNOSIS — D473 Essential (hemorrhagic) thrombocythemia: Secondary | ICD-10-CM

## 2017-12-29 DIAGNOSIS — E7800 Pure hypercholesterolemia, unspecified: Secondary | ICD-10-CM | POA: Diagnosis not present

## 2017-12-29 DIAGNOSIS — E559 Vitamin D deficiency, unspecified: Secondary | ICD-10-CM

## 2017-12-29 DIAGNOSIS — D649 Anemia, unspecified: Secondary | ICD-10-CM | POA: Diagnosis not present

## 2017-12-29 DIAGNOSIS — K219 Gastro-esophageal reflux disease without esophagitis: Secondary | ICD-10-CM | POA: Diagnosis not present

## 2017-12-29 DIAGNOSIS — R000 Tachycardia, unspecified: Secondary | ICD-10-CM

## 2017-12-29 DIAGNOSIS — I10 Essential (primary) hypertension: Secondary | ICD-10-CM | POA: Diagnosis not present

## 2017-12-29 DIAGNOSIS — R739 Hyperglycemia, unspecified: Secondary | ICD-10-CM | POA: Diagnosis not present

## 2017-12-29 NOTE — Progress Notes (Signed)
Patient ID: [name], female   DOB: [birth year], 67 y.o.   MRN: [id number]   Subjective:    Patient ID: [name], female    DOB: 1951/07/05, 67 y.o.   MRN: [id number]  HPI  Patient here for a scheduled follow up.  She just had f/u with cardiology (Dr Nehemiah Massed) 12/20/17.  Previous stress test - normal stress test.  Recommended using cpap regularly.  Also recommended decreasing beta blocker.  He felt no further cardiac w/up warranted.  She reports she is still having sob with exertion.  Still notices episodes of increased heart rate and palpitations.  She is concerned regarding her symptoms.  Request referral to another cardiologist for evaluation and question of need for any further cardiac w/up.  No increased cough and congestion.  No acid reflux.  No abdominal pain.  Some constipation.   Seeing Dr Sharlet Salina.  Recommended aquatic therapy and continued exercise.     Past Medical History:  Diagnosis Date  . Anemia   . Anxiety   . Asthma   . Chronic headaches   . Diverticulosis   . Environmental allergies   . GERD (gastroesophageal reflux disease)   . Hypercholesterolemia   . Hyperglycemia   . Hypertension   . Osteoarthritis   . Palpitations   . Thrombocytosis ([place])   . Urinary incontinence    mixed   Past Surgical History:  Procedure Laterality Date  . BLADDER SURGERY     x2   [facility]  . BREAST CYST ASPIRATION Bilateral 2005   approximate year  . CERVICAL CONE BIOPSY     CIS  . CHOLECYSTECTOMY    . COLONOSCOPY WITH PROPOFOL N/A 07/19/2016   Procedure: COLONOSCOPY WITH PROPOFOL;  Surgeon: Manya Silvas, MD;  Location: [facility] ENDOSCOPY;  Service: Endoscopy;  Laterality: N/A;  . ESOPHAGOGASTRODUODENOSCOPY (EGD) WITH PROPOFOL N/A 07/19/2016   Procedure: ESOPHAGOGASTRODUODENOSCOPY (EGD) WITH PROPOFOL;  Surgeon: Manya Silvas, MD;  Location: [facility] ENDOSCOPY;  Service: Endoscopy;  Laterality: N/A;  . KNEE ARTHROSCOPY  08/13/08  . knee replacement and revision     left  . RECTOCELE REPAIR    . ROTATOR CUFF REPAIR     bilateral  . SHOULDER SURGERY  11/17/05  . TONSILLECTOMY  1962  . VAGINAL HYSTERECTOMY  1974   abnormal pap and carcinoma in situ   Family History  Problem Relation Age of Onset  . Diabetes Mellitus II Father   . Thyroid disease Father   . Breast cancer Maternal Aunt   . Colon cancer Neg Hx   . Kidney cancer Neg Hx   . Bladder Cancer Neg Hx    Social History   Socioeconomic History  . Marital status: Married    Spouse name: Not on file  . Number of children: Not on file  . Years of education: Not on file  . Highest education level: Not on file  Occupational History  . Not on file  Social Needs  . Financial resource strain: Not on file  . Food insecurity:    Worry: Not on file    Inability: Not on file  . Transportation needs:    Medical: Not on file    Non-medical: Not on file  Tobacco Use  . Smoking status: Former Smoker    Last attempt to quit: 08/17/1983    Years since quitting: 34.4  . Smokeless tobacco: Never Used  Substance and Sexual Activity  . Alcohol use: Yes    Alcohol/week: 0.0 oz  Comment: rarely  . Drug use: No  . Sexual activity: Not on file  Lifestyle  . Physical activity:    Days per week: Not on file    Minutes per session: Not on file  . Stress: Not on file  Relationships  . Social connections:    Talks on phone: Not on file    Gets together: Not on file    Attends religious service: Not on file    Active member of club or organization: Not on file    Attends meetings of clubs or organizations: Not on file    Relationship status: Not on file  Other Topics Concern  . Not on file  Social History Narrative  . Not on file    Outpatient Encounter Medications as of 12/29/2017  Medication Sig  . albuterol (PROVENTIL HFA;VENTOLIN HFA) 108 (90 BASE) MCG/ACT inhaler Inhale 2 puffs into the lungs every 4 (four) hours as needed.  Marland Kitchen albuterol (PROVENTIL) (2.5 MG/3ML) 0.083% nebulizer solution  Take 3 mLs (2.5 mg total) by nebulization every 6 (six) hours as needed for wheezing or shortness of breath.  Marland Kitchen aspirin EC 81 MG tablet Take 81 mg by mouth daily.  Marland Kitchen azelastine (ASTELIN) 0.1 % nasal spray Place 1 spray into both nostrils 2 (two) times daily. Use in each nostril as directed  . benzonatate (TESSALON) 200 MG capsule Take 1 capsule (200 mg total) by mouth 3 (three) times daily as needed for cough.  . budesonide-formoterol (SYMBICORT) 80-4.5 MCG/ACT inhaler Inhale 2 puffs into the lungs 2 (two) times daily.  . cetirizine (ZYRTEC) 10 MG tablet Take 10 mg by mouth daily.  . Cholecalciferol (VITAMIN D-3) 1000 units CAPS Take by mouth.  . clobetasol ointment (TEMOVATE) 0.05 % APP EXT AA BID  . fluticasone (FLONASE) 50 MCG/ACT nasal spray SHAKE LIQUID AND USE 2 SPRAYS IN EACH NOSTRIL EVERY DAY  . magnesium oxide (MAG-OX) 400 MG tablet Take 400 mg daily by mouth.  . montelukast (SINGULAIR) 10 MG tablet Take 1 tablet (10 mg total) by mouth at bedtime.  . Multiple Vitamin (MULTIVITAMIN) tablet Take 1 tablet by mouth daily.  Marland Kitchen nystatin (MYCOSTATIN) 100000 UNIT/ML suspension SWISH AND SPIT 5 ML BY MOUTH THREE TIMES DAILY AS NEEDED.  Marland Kitchen ondansetron (ZOFRAN) 4 MG tablet TAKE 1 TABLET(4 MG) BY MOUTH TWICE DAILY AS NEEDED FOR NAUSEA OR VOMITING  . RABEprazole (ACIPHEX) 20 MG tablet Take 20 mg by mouth 2 (two) times daily.  Marland Kitchen rOPINIRole (REQUIP) 0.5 MG tablet Take 0.'25mg'$  (1/2 tablet) nightly for one week then 0.'5mg'$  (1 tablet) nightly  . rOPINIRole (REQUIP) 2 MG tablet   . rosuvastatin (CRESTOR) 10 MG tablet Take 1 tablet (10 mg total) by mouth daily.  . sucralfate (CARAFATE) 1 g tablet Take 1 g by mouth 3 (three) times daily.  Marland Kitchen venlafaxine XR (EFFEXOR-XR) 150 MG 24 hr capsule TAKE 1 CAPSULE BY MOUTH EVERY DAY WITH BREAKFAST  . [DISCONTINUED] clobetasol cream (TEMOVATE) 6.57 % Apply 1 application topically 2 (two) times daily.  . [DISCONTINUED] metoprolol succinate (TOPROL-XL) 50 MG 24 hr tablet  TAKE 1 TABLET BY MOUTH EVERY DAY OR IMMEDIATELY FOLLOWING A MEAL   No facility-administered encounter medications on file as of 12/29/2017.     Review of Systems  Constitutional: Negative for appetite change and unexpected weight change.  HENT: Negative for congestion and sinus pressure.   Respiratory: Negative for cough and chest tightness.        SOB with exertion.    Cardiovascular: Negative  for chest pain and leg swelling.       Some increased heart rate and palpitations - intermittent episodes.   Gastrointestinal: Positive for constipation. Negative for abdominal pain, diarrhea and nausea.  Genitourinary: Negative for difficulty urinating and dysuria.  Musculoskeletal: Negative for joint swelling.       Seeing Dr Sharlet Salina.    Skin: Negative for color change and rash.  Neurological: Negative for dizziness, light-headedness and headaches.  Psychiatric/Behavioral: Negative for agitation and dysphoric mood.       Objective:    Physical Exam  Constitutional: She appears well-developed and well-nourished. No distress.  HENT:  Nose: Nose normal.  Mouth/Throat: Oropharynx is clear and moist.  Neck: Neck supple. No thyromegaly present.  Cardiovascular: Normal rate and regular rhythm.  Pulmonary/Chest: Breath sounds normal. No respiratory distress. She has no wheezes.  Abdominal: Soft. Bowel sounds are normal. There is no tenderness.  Musculoskeletal: She exhibits no edema or tenderness.  Lymphadenopathy:    She has no cervical adenopathy.  Skin: No rash noted. No erythema.  Psychiatric: She has a normal mood and affect. Her behavior is normal.    BP 130/76 (BP Location: Left Arm, Patient Position: Sitting, Cuff Size: Large)   Pulse 94   Temp (!) 97.4 F (36.3 C) (Oral)   Resp 16   Wt 250 lb 9.6 oz (113.7 kg)   SpO2 97%   BMI 41.70 kg/m  Wt Readings from Last 3 Encounters:  12/29/17 250 lb 9.6 oz (113.7 kg)  09/30/17 249 lb (112.9 kg)  09/14/17 245 lb (111.1 kg)      Lab Results  Component Value Date   WBC 8.9 12/27/2017   HGB 12.5 12/27/2017   HCT 37.7 12/27/2017   PLT 476.0 (H) 12/27/2017   GLUCOSE 111 (H) 12/27/2017   CHOL 171 12/27/2017   TRIG 110.0 12/27/2017   HDL 55.10 12/27/2017   LDLDIRECT 172.8 09/17/2013   LDLCALC 94 12/27/2017   ALT 16 12/27/2017   AST 16 12/27/2017   NA 143 12/27/2017   K 4.4 12/27/2017   CL 107 12/27/2017   CREATININE 0.96 12/27/2017   BUN 14 12/27/2017   CO2 27 12/27/2017   TSH 2.17 08/03/2017   INR 0.9 07/02/2013   HGBA1C 6.0 12/27/2017   MICROALBUR 1.1 04/28/2015    Mr Hip Right Wo Contrast  Result Date: 10/26/2017 CLINICAL DATA:  Started exercise program 2 months ago. Injection and exercise did not help. EXAM: MR OF THE RIGHT HIP WITHOUT CONTRAST TECHNIQUE: Multiplanar, multisequence MR imaging was performed. No intravenous contrast was administered. COMPARISON:  None. FINDINGS: Bones: No hip fracture, dislocation or avascular necrosis. No periosteal reaction or bone destruction. No aggressive osseous lesion. Normal sacrum and sacroiliac joints. No SI joint widening or erosive changes. Articular cartilage and labrum Articular cartilage:  No chondral defect. Labrum: Grossly intact, but evaluation is limited by lack of intraarticular fluid. Joint or bursal effusion Joint effusion:  No hip joint effusion.  No SI joint effusion. Bursae: Trace amount of fluid in the left greater trochanteric bursa. Otherwise no significant bursal fluid. Muscles and tendons Flexors: Mild insertional tendinosis of the right iliopsoas tendon. Flexor compartment muscles are otherwise intact. Extensors: Normal. Abductors: Normal. Adductors: Normal. Gluteals: Normal. Hamstrings: Mild tendinosis of the left hamstring origin. Other findings Miscellaneous: No pelvic free fluid. No fluid collection or hematoma. No inguinal lymphadenopathy. No inguinal hernia. IMPRESSION: 1. Mild insertional tendinosis of the right iliopsoas tendon.  Electronically Signed   By: Kathreen Devoid  On: 10/26/2017 10:34       Assessment & Plan:   Problem List Items Addressed This Visit    Anemia    Has been evaluated by Dr Ma Hillock.  Follow cbc.        BMI 40.0-44.9, adult (HCC)    Discussed diet and exercise.  Follow.        GERD (gastroesophageal reflux disease)    Controlled on current regimen.  Follow.        Hypercholesterolemia    On crestor.  Low cholesterol diet and exercise.  Follow lipid panel and liver function tests.        Relevant Orders   Hepatic function panel   Lipid panel   Hyperglycemia    Low carb diet and exercise.  Follow met b and a1c.        Relevant Orders   Hemoglobin A1c   Hypertension    Blood pressure under good control.  Continue same medication regimen.  Follow pressures.  Follow metabolic panel.        Relevant Orders   Basic metabolic panel   Palpitations    Persistent palpitations and increased heart rate - intermittent episodes.  Recently evaluated by cardiology as outlined.  Stress test - no ischemia.  Encouraged to use cpap regularly.  She is concerned regarding persistent symptoms and sob with exertion.  Request referral to a different cardiologist for evaluation.  Refer.        Relevant Orders   Ambulatory referral to Cardiology   Stress    Increased stress.  Discussed with her today.  Discussed counseling and referral to psych.  Wants to hold on any further intervention.  Follow.        Tachycardia    Just had cardiology evaluation as outlined.  See above.  Request referral to cardiology for further evaluation and to confirm if any further testing warranted.        Relevant Orders   Ambulatory referral to Cardiology   Thrombocytosis ([place])    Follow cbc.        Relevant Orders   CBC with Differential/Platelet   Vitamin D deficiency    Follow vitamin D level.            Einar Pheasant, MD

## 2017-12-30 DIAGNOSIS — M7061 Trochanteric bursitis, right hip: Secondary | ICD-10-CM | POA: Diagnosis not present

## 2018-01-09 ENCOUNTER — Encounter: Payer: Self-pay | Admitting: Internal Medicine

## 2018-01-09 ENCOUNTER — Other Ambulatory Visit: Payer: Self-pay | Admitting: Family

## 2018-01-09 NOTE — Assessment & Plan Note (Signed)
Follow vitamin D level.  

## 2018-01-09 NOTE — Assessment & Plan Note (Signed)
On crestor.  Low cholesterol diet and exercise.  Follow lipid panel and liver function tests.   

## 2018-01-09 NOTE — Assessment & Plan Note (Signed)
Just had cardiology evaluation as outlined.  See above.  Request referral to cardiology for further evaluation and to confirm if any further testing warranted.

## 2018-01-09 NOTE — Assessment & Plan Note (Signed)
Low carb diet and exercise.  Follow met b and a1c.   

## 2018-01-09 NOTE — Assessment & Plan Note (Signed)
Discussed diet and exercise.  Follow.  

## 2018-01-09 NOTE — Assessment & Plan Note (Signed)
Has been evaluated by Dr Pandit.  Follow cbc.   

## 2018-01-09 NOTE — Assessment & Plan Note (Signed)
Controlled on current regimen.  Follow.  

## 2018-01-09 NOTE — Assessment & Plan Note (Signed)
Follow cbc.  

## 2018-01-09 NOTE — Assessment & Plan Note (Signed)
Increased stress.  Discussed with her today.  Discussed counseling and referral to psych.  Wants to hold on any further intervention.  Follow.

## 2018-01-09 NOTE — Assessment & Plan Note (Signed)
Blood pressure under good control.  Continue same medication regimen.  Follow pressures.  Follow metabolic panel.   

## 2018-01-09 NOTE — Assessment & Plan Note (Signed)
Persistent palpitations and increased heart rate - intermittent episodes.  Recently evaluated by cardiology as outlined.  Stress test - no ischemia.  Encouraged to use cpap regularly.  She is concerned regarding persistent symptoms and sob with exertion.  Request referral to a different cardiologist for evaluation.  Refer.

## 2018-01-10 MED ORDER — NYSTATIN 100000 UNIT/ML MT SUSP: OROMUCOSAL | 0 refills | Status: DC

## 2018-01-10 NOTE — Telephone Encounter (Signed)
Patient states when seen on 12/29/17 you were going to call in due to issues she was having from medications Thanks

## 2018-02-07 ENCOUNTER — Other Ambulatory Visit: Payer: Self-pay | Admitting: Internal Medicine

## 2018-02-15 ENCOUNTER — Ambulatory Visit
Admission: RE | Admit: 2018-02-15 | Discharge: 2018-02-15 | Disposition: A | Discharge status: Home / Self Care | Payer: Medicare Other | Source: Ambulatory Visit | Attending: Internal Medicine | Admitting: Internal Medicine

## 2018-02-15 DIAGNOSIS — R928 Other abnormal and inconclusive findings on diagnostic imaging of breast: Secondary | ICD-10-CM | POA: Diagnosis not present

## 2018-02-15 DIAGNOSIS — N6313 Unspecified lump in the right breast, lower outer quadrant: Secondary | ICD-10-CM | POA: Diagnosis not present

## 2018-02-15 DIAGNOSIS — R922 Inconclusive mammogram: Secondary | ICD-10-CM | POA: Diagnosis not present

## 2018-02-15 DIAGNOSIS — N6311 Unspecified lump in the right breast, upper outer quadrant: Secondary | ICD-10-CM | POA: Diagnosis not present

## 2018-02-17 ENCOUNTER — Other Ambulatory Visit: Payer: Self-pay | Admitting: Internal Medicine

## 2018-02-17 DIAGNOSIS — R928 Other abnormal and inconclusive findings on diagnostic imaging of breast: Secondary | ICD-10-CM

## 2018-02-17 NOTE — Progress Notes (Signed)
Orders placed for f/u mammogram and ultrasound.

## 2018-03-13 ENCOUNTER — Telehealth: Payer: Self-pay | Admitting: Pulmonary Disease

## 2018-03-13 NOTE — Telephone Encounter (Signed)
Routing message to [facility]out in [facility] regarding referral to ENT Pt is needing another appt scheduled, as she did not show for March 2019 appt Rhonda please advise

## 2018-03-14 NOTE — Telephone Encounter (Signed)
Called and LMOVM for patient to return my call.  Called and spoke with Alyse Low at [facility] ENT and she stated that no new referral was needed, however, since patient cancelled appointment in March the referral and notes were place in shred.  I faxed over the OV notes, Med List, copy of Ins Card and the original referral to fax # [phone].  Nothing else is needed at this time.   Rhonda J Cobb

## 2018-03-14 NOTE — Telephone Encounter (Signed)
Another referral for ENT will need to be placed and the reason why she needs to be seen.  Will route to the [facility] to place referral. Nicole Sims

## 2018-03-15 DIAGNOSIS — J305 Allergic rhinitis due to food: Secondary | ICD-10-CM | POA: Diagnosis not present

## 2018-03-15 DIAGNOSIS — J323 Chronic sphenoidal sinusitis: Secondary | ICD-10-CM | POA: Diagnosis not present

## 2018-03-15 DIAGNOSIS — J301 Allergic rhinitis due to pollen: Secondary | ICD-10-CM | POA: Diagnosis not present

## 2018-03-15 DIAGNOSIS — J309 Allergic rhinitis, unspecified: Secondary | ICD-10-CM | POA: Diagnosis not present

## 2018-04-04 ENCOUNTER — Ambulatory Visit: Payer: Medicare Other | Admitting: Cardiovascular Disease

## 2018-04-04 ENCOUNTER — Encounter

## 2018-04-04 ENCOUNTER — Encounter: Payer: Self-pay | Admitting: Cardiovascular Disease

## 2018-04-04 VITALS — BP 148/88 | HR 73 | Ht 65.0 in | Wt 239.8 lb

## 2018-04-04 DIAGNOSIS — R079 Chest pain, unspecified: Secondary | ICD-10-CM | POA: Diagnosis not present

## 2018-04-04 DIAGNOSIS — R0609 Other forms of dyspnea: Secondary | ICD-10-CM | POA: Diagnosis not present

## 2018-04-04 DIAGNOSIS — Z01818 Encounter for other preprocedural examination: Secondary | ICD-10-CM

## 2018-04-04 DIAGNOSIS — E785 Hyperlipidemia, unspecified: Secondary | ICD-10-CM | POA: Diagnosis not present

## 2018-04-04 DIAGNOSIS — R002 Palpitations: Secondary | ICD-10-CM

## 2018-04-04 NOTE — H&P (View-Only) (Signed)
Cardiology Office Note   Date:  04/04/2018   ID:  Nicole Sims, DOB 1951-07-02, MRN [id number]  PCP:  Einar Pheasant, MD  Cardiologist:   Kathlyn Sacramento, MD   Chief Complaint  Patient presents with  . OTHER    Palpitations/Tachycardia, sob and left ankle edema. Meds reviewed verbally with pt.      History of Present Illness: Nicole Sims is a 67 y.o. female who is referred by Dr. Nicki Reaper for a second opinion regarding evaluation of exertional dyspnea and palpitations.  She has multiple chronic medical conditions that include hyperlipidemia, sleep apnea on CPAP, essential hypertension and obesity.  She was seen by Dr. Nehemiah Massed recently for evaluation of exertional dyspnea, fatigue and palpitations.  She underwent an echocardiogram and a treadmill Myoview and both were unremarkable.  She also had a 24-hour Holter monitor that showed no significant arrhythmia.  It was felt that some of her symptoms might be related to metoprolol.  She was taking the extended release 50 mg daily.  The medication was stopped and shortly after, her palpitations worsen significantly.  She resumed taking the medication but at a lower dose of 25 mg once daily with improvement in palpitations.  However, she continues to have dyspnea with minimal exertion with occasional substernal chest tightness.  She takes sublingual nitroglycerin with improvement.  She describes orthopnea but no PND.  She also has bilateral leg edema. She is a former smoker and quit in 1985.   Past Medical History:  Diagnosis Date  . Anemia   . Anxiety   . Asthma   . Chronic headaches   . Diverticulosis   . Environmental allergies   . GERD (gastroesophageal reflux disease)   . Heart murmur   . Hypercholesterolemia   . Hyperglycemia   . Hypertension   . Osteoarthritis   . Palpitations   . Thrombocytosis ([place])   . Urinary incontinence    mixed    Past Surgical History:  Procedure Laterality Date  . BLADDER SURGERY     x2    [facility]  . BREAST CYST ASPIRATION Bilateral 2005   approximate year  . CARDIAC CATHETERIZATION     Nehemiah Massed  . CERVICAL CONE BIOPSY     CIS  . CHOLECYSTECTOMY    . COLONOSCOPY WITH PROPOFOL N/A 07/19/2016   Procedure: COLONOSCOPY WITH PROPOFOL;  Surgeon: Manya Silvas, MD;  Location: [facility] ENDOSCOPY;  Service: Endoscopy;  Laterality: N/A;  . ESOPHAGOGASTRODUODENOSCOPY (EGD) WITH PROPOFOL N/A 07/19/2016   Procedure: ESOPHAGOGASTRODUODENOSCOPY (EGD) WITH PROPOFOL;  Surgeon: Manya Silvas, MD;  Location: [facility] ENDOSCOPY;  Service: Endoscopy;  Laterality: N/A;  . KNEE ARTHROSCOPY  08/13/08  . knee replacement and revision     left  . RECTOCELE REPAIR    . ROTATOR CUFF REPAIR     bilateral  . SHOULDER SURGERY  11/17/05  . TONSILLECTOMY  1962  . VAGINAL HYSTERECTOMY  1974   abnormal pap and carcinoma in situ     Current Outpatient Medications  Medication Sig Dispense Refill  . albuterol (PROVENTIL HFA;VENTOLIN HFA) 108 (90 BASE) MCG/ACT inhaler Inhale 2 puffs into the lungs every 4 (four) hours as needed. 1 Inhaler 3  . albuterol (PROVENTIL) (2.5 MG/3ML) 0.083% nebulizer solution Take 3 mLs (2.5 mg total) by nebulization every 6 (six) hours as needed for wheezing or shortness of breath. 150 mL 1  . aspirin EC 81 MG tablet Take 81 mg by mouth daily.    Marland Kitchen azelastine (ASTELIN)  0.1 % nasal spray Place 1 spray into both nostrils 2 (two) times daily. Use in each nostril as directed 90 mL 1  . budesonide-formoterol (SYMBICORT) 80-4.5 MCG/ACT inhaler Inhale 2 puffs into the lungs 2 (two) times daily. 1 Inhaler 3  . Cholecalciferol (VITAMIN D-3) 1000 units CAPS Take by mouth.    . clobetasol ointment (TEMOVATE) 0.05 % APP EXT AA BID  0  . magnesium oxide (MAG-OX) 400 MG tablet Take 400 mg daily by mouth.    . montelukast (SINGULAIR) 10 MG tablet TAKE 1 TABLET BY MOUTH AT  BEDTIME 90 tablet 1  . Multiple Vitamin (MULTIVITAMIN) tablet Take 1 tablet by mouth daily.    Marland Kitchen nystatin  (MYCOSTATIN) 100000 UNIT/ML suspension Apply to affected area bid 60 mL 0  . ondansetron (ZOFRAN) 4 MG tablet TAKE 1 TABLET(4 MG) BY MOUTH TWICE DAILY AS NEEDED FOR NAUSEA OR VOMITING 20 tablet 0  . RABEprazole (ACIPHEX) 20 MG tablet Take 20 mg by mouth 2 (two) times daily.    Marland Kitchen rOPINIRole (REQUIP) 0.5 MG tablet Take 0.25mg  (1/2 tablet) nightly for one week then 0.5mg  (1 tablet) nightly    . rOPINIRole (REQUIP) 2 MG tablet   1  . rosuvastatin (CRESTOR) 10 MG tablet TAKE 1 TABLET BY MOUTH  DAILY 90 tablet 1  . venlafaxine XR (EFFEXOR-XR) 150 MG 24 hr capsule TAKE 1 CAPSULE BY MOUTH  EVERY DAY WITH BREAKFAST 90 capsule 1   No current facility-administered medications for this visit.     Allergies:   Augmentin [amoxicillin-pot clavulanate]; Bactrim [sulfamethoxazole-trimethoprim]; Doxycycline; Hydrocodone-acetaminophen; Pseudoephedrine; Relafen [nabumetone]; and Shellfish allergy    Social History:  The patient  reports that she quit smoking about 34 years ago. She has never used smokeless tobacco. She reports that she drinks alcohol. She reports that she does not use drugs.   Family History:  The patient's family history includes Breast cancer in her maternal aunt; Diabetes Mellitus II in her father; Heart Problems in her brother; Thyroid disease in her father.    ROS:  Please see the history of present illness.   Otherwise, review of systems are positive for none.   All other systems are reviewed and negative.    PHYSICAL EXAM: VS:  BP (!) 148/88 (BP Location: Right Arm, Patient Position: Sitting, Cuff Size: Large)   Pulse 73   Ht 5\' 5"  (1.651 m)   Wt 239 lb 12 oz (108.7 kg)   BMI 39.90 kg/m  , BMI Body mass index is 39.9 kg/m. GEN: Well nourished, well developed, in no acute distress  HEENT: normal  Neck: no JVD, carotid bruits, or masses Cardiac: RRR; no murmurs, rubs, or gallops,no edema  Respiratory:  clear to auscultation bilaterally, normal work of breathing GI: soft,  nontender, nondistended, + BS MS: no deformity or atrophy  Skin: warm and dry, no rash Neuro:  Strength and sensation are intact Psych: euthymic mood, full affect   EKG:  EKG is ordered today. The ekg ordered today demonstrates normal sinus rhythm with no significant ST or T wave changes.   Recent Labs: 08/03/2017: TSH 2.17 12/27/2017: ALT 16; BUN 14; Creatinine, Ser 0.96; Hemoglobin 12.5; Platelets 476.0; Potassium 4.4; Sodium 143    Lipid Panel    Component Value Date/Time   CHOL 171 12/27/2017 0822   TRIG 110.0 12/27/2017 0822   HDL 55.10 12/27/2017 0822   CHOLHDL 3 12/27/2017 0822   VLDL 22.0 12/27/2017 0822   LDLCALC 94 12/27/2017 0822   LDLDIRECT 172.8 09/17/2013  0950      Wt Readings from Last 3 Encounters:  04/04/18 239 lb 12 oz (108.7 kg)  12/29/17 250 lb 9.6 oz (113.7 kg)  09/30/17 249 lb (112.9 kg)       PAD Screen 04/04/2018  Previous PAD dx? No  Previous surgical procedure? No  Pain with walking? Yes  Subsides with rest? Yes  Feet/toe relief with dangling? No  Painful, non-healing ulcers? No  Extremities discolored? No      ASSESSMENT AND PLAN:  1.  Exertional dyspnea and chest tightness: In spite of negative cardiac work-up so far, her symptoms are concerning.  I explained to her that stress testing can be false negative in about 15% of patients.  Obviously, some of her symptoms might be related to stress and physical deconditioning.  However, I do think based on the severity of her symptoms that we need to reach a definitive diagnosis.  Due to that, I think the best option is to proceed with a right and left cardiac catheterization to exclude the possibility of underlying obstructive coronary artery disease and also evaluate for pulmonary hypertension given her underlying sleep apnea.  I discussed the procedure in details as well as risks and benefits.  2.  Palpitations: Suspect sinus tachycardia with worsening after stopping metoprolol.  I agree with  resuming Toprol currently at a dose of 25 mg once daily. A 2-week outpatient monitor can be considered if symptoms do not improve.  3.  Hyperlipidemia: Currently on rosuvastatin.    Disposition:   FU with me in 1 month  Signed,  Kathlyn Sacramento, MD  04/04/2018 2:00 PM    [place]

## 2018-04-04 NOTE — Progress Notes (Signed)
Cardiology Office Note   Date:  04/04/2018   ID:  ROSANN Sims, DOB 1951-03-25, MRN [id number]  PCP:  Einar Pheasant, MD  Cardiologist:   Kathlyn Sacramento, MD   Chief Complaint  Patient presents with  . OTHER    Palpitations/Tachycardia, sob and left ankle edema. Meds reviewed verbally with pt.      History of Present Illness: Nicole Sims is a 67 y.o. female who is referred by Dr. Nicki Reaper for a second opinion regarding evaluation of exertional dyspnea and palpitations.  She has multiple chronic medical conditions that include hyperlipidemia, sleep apnea on CPAP, essential hypertension and obesity.  She was seen by Dr. Nehemiah Massed recently for evaluation of exertional dyspnea, fatigue and palpitations.  She underwent an echocardiogram and a treadmill Myoview and both were unremarkable.  She also had a 24-hour Holter monitor that showed no significant arrhythmia.  It was felt that some of her symptoms might be related to metoprolol.  She was taking the extended release 50 mg daily.  The medication was stopped and shortly after, her palpitations worsen significantly.  She resumed taking the medication but at a lower dose of 25 mg once daily with improvement in palpitations.  However, she continues to have dyspnea with minimal exertion with occasional substernal chest tightness.  She takes sublingual nitroglycerin with improvement.  She describes orthopnea but no PND.  She also has bilateral leg edema. She is a former smoker and quit in 1985.   Past Medical History:  Diagnosis Date  . Anemia   . Anxiety   . Asthma   . Chronic headaches   . Diverticulosis   . Environmental allergies   . GERD (gastroesophageal reflux disease)   . Heart murmur   . Hypercholesterolemia   . Hyperglycemia   . Hypertension   . Osteoarthritis   . Palpitations   . Thrombocytosis ([place])   . Urinary incontinence    mixed    Past Surgical History:  Procedure Laterality Date  . BLADDER SURGERY     x2    [facility]  . BREAST CYST ASPIRATION Bilateral 2005   approximate year  . CARDIAC CATHETERIZATION     Nehemiah Massed  . CERVICAL CONE BIOPSY     CIS  . CHOLECYSTECTOMY    . COLONOSCOPY WITH PROPOFOL N/A 07/19/2016   Procedure: COLONOSCOPY WITH PROPOFOL;  Surgeon: Manya Silvas, MD;  Location: [facility]COPY;  Service: Endoscopy;  Laterality: N/A;  . ESOPHAGOGASTRODUODENOSCOPY (EGD) WITH PROPOFOL N/A 07/19/2016   Procedure: ESOPHAGOGASTRODUODENOSCOPY (EGD) WITH PROPOFOL;  Surgeon: Manya Silvas, MD;  Location: [facility] ENDOSCOPY;  Service: Endoscopy;  Laterality: N/A;  . KNEE ARTHROSCOPY  08/13/08  . knee replacement and revision     left  . RECTOCELE REPAIR    . ROTATOR CUFF REPAIR     bilateral  . SHOULDER SURGERY  11/17/05  . TONSILLECTOMY  1962  . VAGINAL HYSTERECTOMY  1974   abnormal pap and carcinoma in situ     Current Outpatient Medications  Medication Sig Dispense Refill  . albuterol (PROVENTIL HFA;VENTOLIN HFA) 108 (90 BASE) MCG/ACT inhaler Inhale 2 puffs into the lungs every 4 (four) hours as needed. 1 Inhaler 3  . albuterol (PROVENTIL) (2.5 MG/3ML) 0.083% nebulizer solution Take 3 mLs (2.5 mg total) by nebulization every 6 (six) hours as needed for wheezing or shortness of breath. 150 mL 1  . aspirin EC 81 MG tablet Take 81 mg by mouth daily.    Marland Kitchen azelastine (ASTELIN)  0.1 % nasal spray Place 1 spray into both nostrils 2 (two) times daily. Use in each nostril as directed 90 mL 1  . budesonide-formoterol (SYMBICORT) 80-4.5 MCG/ACT inhaler Inhale 2 puffs into the lungs 2 (two) times daily. 1 Inhaler 3  . Cholecalciferol (VITAMIN D-3) 1000 units CAPS Take by mouth.    . clobetasol ointment (TEMOVATE) 0.05 % APP EXT AA BID  0  . magnesium oxide (MAG-OX) 400 MG tablet Take 400 mg daily by mouth.    . montelukast (SINGULAIR) 10 MG tablet TAKE 1 TABLET BY MOUTH AT  BEDTIME 90 tablet 1  . Multiple Vitamin (MULTIVITAMIN) tablet Take 1 tablet by mouth daily.    Marland Kitchen nystatin  (MYCOSTATIN) 100000 UNIT/ML suspension Apply to affected area bid 60 mL 0  . ondansetron (ZOFRAN) 4 MG tablet TAKE 1 TABLET(4 MG) BY MOUTH TWICE DAILY AS NEEDED FOR NAUSEA OR VOMITING 20 tablet 0  . RABEprazole (ACIPHEX) 20 MG tablet Take 20 mg by mouth 2 (two) times daily.    Marland Kitchen rOPINIRole (REQUIP) 0.5 MG tablet Take 0.25mg  (1/2 tablet) nightly for one week then 0.5mg  (1 tablet) nightly    . rOPINIRole (REQUIP) 2 MG tablet   1  . rosuvastatin (CRESTOR) 10 MG tablet TAKE 1 TABLET BY MOUTH  DAILY 90 tablet 1  . venlafaxine XR (EFFEXOR-XR) 150 MG 24 hr capsule TAKE 1 CAPSULE BY MOUTH  EVERY DAY WITH BREAKFAST 90 capsule 1   No current facility-administered medications for this visit.     Allergies:   Augmentin [amoxicillin-pot clavulanate]; Bactrim [sulfamethoxazole-trimethoprim]; Doxycycline; Hydrocodone-acetaminophen; Pseudoephedrine; Relafen [nabumetone]; and Shellfish allergy    Social History:  The patient  reports that she quit smoking about 34 years ago. She has never used smokeless tobacco. She reports that she drinks alcohol. She reports that she does not use drugs.   Family History:  The patient's family history includes Breast cancer in her maternal aunt; Diabetes Mellitus II in her father; Heart Problems in her brother; Thyroid disease in her father.    ROS:  Please see the history of present illness.   Otherwise, review of systems are positive for none.   All other systems are reviewed and negative.    PHYSICAL EXAM: VS:  BP (!) 148/88 (BP Location: Right Arm, Patient Position: Sitting, Cuff Size: Large)   Pulse 73   Ht 5\' 5"  (1.651 m)   Wt 239 lb 12 oz (108.7 kg)   BMI 39.90 kg/m  , BMI Body mass index is 39.9 kg/m. GEN: Well nourished, well developed, in no acute distress  HEENT: normal  Neck: no JVD, carotid bruits, or masses Cardiac: RRR; no murmurs, rubs, or gallops,no edema  Respiratory:  clear to auscultation bilaterally, normal work of breathing GI: soft,  nontender, nondistended, + BS MS: no deformity or atrophy  Skin: warm and dry, no rash Neuro:  Strength and sensation are intact Psych: euthymic mood, full affect   EKG:  EKG is ordered today. The ekg ordered today demonstrates normal sinus rhythm with no significant ST or T wave changes.   Recent Labs: 08/03/2017: TSH 2.17 12/27/2017: ALT 16; BUN 14; Creatinine, Ser 0.96; Hemoglobin 12.5; Platelets 476.0; Potassium 4.4; Sodium 143    Lipid Panel    Component Value Date/Time   CHOL 171 12/27/2017 0822   TRIG 110.0 12/27/2017 0822   HDL 55.10 12/27/2017 0822   CHOLHDL 3 12/27/2017 0822   VLDL 22.0 12/27/2017 0822   LDLCALC 94 12/27/2017 0822   LDLDIRECT 172.8 09/17/2013  0950      Wt Readings from Last 3 Encounters:  04/04/18 239 lb 12 oz (108.7 kg)  12/29/17 250 lb 9.6 oz (113.7 kg)  09/30/17 249 lb (112.9 kg)       PAD Screen 04/04/2018  Previous PAD dx? No  Previous surgical procedure? No  Pain with walking? Yes  Subsides with rest? Yes  Feet/toe relief with dangling? No  Painful, non-healing ulcers? No  Extremities discolored? No      ASSESSMENT AND PLAN:  1.  Exertional dyspnea and chest tightness: In spite of negative cardiac work-up so far, her symptoms are concerning.  I explained to her that stress testing can be false negative in about 15% of patients.  Obviously, some of her symptoms might be related to stress and physical deconditioning.  However, I do think based on the severity of her symptoms that we need to reach a definitive diagnosis.  Due to that, I think the best option is to proceed with a right and left cardiac catheterization to exclude the possibility of underlying obstructive coronary artery disease and also evaluate for pulmonary hypertension given her underlying sleep apnea.  I discussed the procedure in details as well as risks and benefits.  2.  Palpitations: Suspect sinus tachycardia with worsening after stopping metoprolol.  I agree with  resuming Toprol currently at a dose of 25 mg once daily. A 2-week outpatient monitor can be considered if symptoms do not improve.  3.  Hyperlipidemia: Currently on rosuvastatin.    Disposition:   FU with me in 1 month  Signed,  Kathlyn Sacramento, MD  04/04/2018 2:00 PM    [place]

## 2018-04-04 NOTE — Patient Instructions (Signed)
Medication Instructions: Your physician recommends that you continue on your current medications as directed. Please refer to the Current Medication list given to you today.  If you need a refill on your cardiac medications before your next appointment, please call your pharmacy.    Follow-Up: Your physician wants you to follow-up in one month after the Cardiac Cath with Dr. Fletcher Anon. You will receive a reminder letter in the mail two months in advance. If you don't receive a letter, please call our office at [phone] to schedule this follow-up appointment.   Thank you for choosing Heartcare at [facility]!        [address] [place] [address] Dept: [phone] Loc: [facility]  04/04/2018  You are scheduled for a Cardiac Catheterization on Monday, August 26 with Dr. Kathlyn Sacramento.  1. Arrive at the [facility] entrance at 8:30 am, one hour prior to your procedure. Free valet service is available.  After entering the [facility] please check-in at the registration desk (1st desk on your right) to receive your armband. After receiving your armband someone will escort you to the cardiac cath/special procedures waiting area. service is available.   Special note: Every effort is made to have your procedure done on time. Please understand that emergencies sometimes delay scheduled procedures.  2. Diet: Do not eat solid foods after midnight.  The patient may have clear liquids until 5am upon the day of the procedure.  3. Labs: You will need to have blood drawn today at the [facility].  4. Medication instructions in preparation for your procedure: None to hold    On the morning of your procedure, take your Aspirin and any morning medicines NOT listed above.  You may use sips of water.  5. Plan for one night stay--bring personal belongings. 6.  Bring a current list of your medications and current insurance cards. 7. You MUST have a responsible person to drive you home. 8. Someone MUST be with you the first 24 hours after you arrive home or your discharge will be delayed. 9. Please wear clothes that are easy to get on and off and wear slip-on shoes.  Thank you for allowing [place] to care for you!   --  Invasive Cardiovascular services

## 2018-04-05 LAB — CBC
HEMOGLOBIN: 12.8 g/dL (ref 11.1–15.9)
Hematocrit: 38.8 % (ref 34.0–46.6)
MCH: 27.2 pg (ref 26.6–33.0)
MCHC: 33 g/dL (ref 31.5–35.7)
MCV: 82 fL (ref 79–97)
PLATELETS: 470 10*3/uL — AB (ref 150–450)
RBC: 4.71 x10E6/uL (ref 3.77–5.28)
RDW: 13.5 % (ref 12.3–15.4)
WBC: 8.5 10*3/uL (ref 3.4–10.8)

## 2018-04-05 LAB — BASIC METABOLIC PANEL
BUN/Creatinine Ratio: 13 (ref 12–28)
BUN: 13 mg/dL (ref 8–27)
CALCIUM: 9.7 mg/dL (ref 8.7–10.3)
CO2: 23 mmol/L (ref 20–29)
CREATININE: 1.01 mg/dL — AB (ref 0.57–1.00)
Chloride: 103 mmol/L (ref 96–106)
GFR calc Af Amer: 67 mL/min/{1.73_m2} (ref >59)
GFR, EST NON AFRICAN AMERICAN: 58 mL/min/{1.73_m2} — AB (ref >59)
GLUCOSE: 90 mg/dL (ref 65–99)
Potassium: 4.8 mmol/L (ref 3.5–5.2)
SODIUM: 141 mmol/L (ref 134–144)

## 2018-04-10 ENCOUNTER — Ambulatory Visit
Admission: RE | Admit: 2018-04-10 | Discharge: 2018-04-10 | Disposition: A | Discharge status: Home / Self Care | Payer: Medicare Other | Source: Ambulatory Visit | Attending: Cardiovascular Disease | Admitting: Cardiovascular Disease

## 2018-04-10 ENCOUNTER — Encounter
Admission: RE | Disposition: A | Discharge status: Home / Self Care | Payer: Self-pay | Source: Ambulatory Visit | Attending: Cardiovascular Disease

## 2018-04-10 ENCOUNTER — Encounter: Payer: Self-pay | Admitting: Internal Medicine

## 2018-04-10 DIAGNOSIS — I10 Essential (primary) hypertension: Secondary | ICD-10-CM | POA: Diagnosis not present

## 2018-04-10 DIAGNOSIS — J45909 Unspecified asthma, uncomplicated: Secondary | ICD-10-CM | POA: Diagnosis not present

## 2018-04-10 DIAGNOSIS — Z90710 Acquired absence of both cervix and uterus: Secondary | ICD-10-CM | POA: Diagnosis not present

## 2018-04-10 DIAGNOSIS — Z79899 Other long term (current) drug therapy: Secondary | ICD-10-CM | POA: Insufficient documentation

## 2018-04-10 DIAGNOSIS — Z8249 Family history of ischemic heart disease and other diseases of the circulatory system: Secondary | ICD-10-CM | POA: Diagnosis not present

## 2018-04-10 DIAGNOSIS — Z880 Allergy status to penicillin: Secondary | ICD-10-CM | POA: Insufficient documentation

## 2018-04-10 DIAGNOSIS — F419 Anxiety disorder, unspecified: Secondary | ICD-10-CM | POA: Diagnosis not present

## 2018-04-10 DIAGNOSIS — E669 Obesity, unspecified: Secondary | ICD-10-CM | POA: Insufficient documentation

## 2018-04-10 DIAGNOSIS — Z885 Allergy status to narcotic agent status: Secondary | ICD-10-CM | POA: Diagnosis not present

## 2018-04-10 DIAGNOSIS — R0609 Other forms of dyspnea: Secondary | ICD-10-CM | POA: Insufficient documentation

## 2018-04-10 DIAGNOSIS — Z882 Allergy status to sulfonamides status: Secondary | ICD-10-CM | POA: Insufficient documentation

## 2018-04-10 DIAGNOSIS — Z6839 Body mass index (BMI) 39.0-39.9, adult: Secondary | ICD-10-CM | POA: Diagnosis not present

## 2018-04-10 DIAGNOSIS — Z96652 Presence of left artificial knee joint: Secondary | ICD-10-CM | POA: Insufficient documentation

## 2018-04-10 DIAGNOSIS — G4730 Sleep apnea, unspecified: Secondary | ICD-10-CM | POA: Insufficient documentation

## 2018-04-10 DIAGNOSIS — E7800 Pure hypercholesterolemia, unspecified: Secondary | ICD-10-CM | POA: Insufficient documentation

## 2018-04-10 DIAGNOSIS — Z881 Allergy status to other antibiotic agents status: Secondary | ICD-10-CM | POA: Diagnosis not present

## 2018-04-10 DIAGNOSIS — K219 Gastro-esophageal reflux disease without esophagitis: Secondary | ICD-10-CM | POA: Diagnosis not present

## 2018-04-10 DIAGNOSIS — Z9889 Other specified postprocedural states: Secondary | ICD-10-CM | POA: Insufficient documentation

## 2018-04-10 DIAGNOSIS — Z7951 Long term (current) use of inhaled steroids: Secondary | ICD-10-CM | POA: Diagnosis not present

## 2018-04-10 DIAGNOSIS — Z87891 Personal history of nicotine dependence: Secondary | ICD-10-CM | POA: Insufficient documentation

## 2018-04-10 DIAGNOSIS — R002 Palpitations: Secondary | ICD-10-CM | POA: Diagnosis not present

## 2018-04-10 DIAGNOSIS — M1990 Unspecified osteoarthritis, unspecified site: Secondary | ICD-10-CM | POA: Insufficient documentation

## 2018-04-10 DIAGNOSIS — Z7982 Long term (current) use of aspirin: Secondary | ICD-10-CM | POA: Diagnosis not present

## 2018-04-10 DIAGNOSIS — R0602 Shortness of breath: Secondary | ICD-10-CM

## 2018-04-10 DIAGNOSIS — Z9049 Acquired absence of other specified parts of digestive tract: Secondary | ICD-10-CM | POA: Diagnosis not present

## 2018-04-10 DIAGNOSIS — Z91013 Allergy to seafood: Secondary | ICD-10-CM | POA: Insufficient documentation

## 2018-04-10 DIAGNOSIS — I5030 Unspecified diastolic (congestive) heart failure: Secondary | ICD-10-CM | POA: Insufficient documentation

## 2018-04-10 HISTORY — PX: RIGHT/LEFT HEART CATH AND CORONARY ANGIOGRAPHY: CATH118266

## 2018-04-10 SURGERY — RIGHT/LEFT HEART CATH AND CORONARY ANGIOGRAPHY
Anesthesia: Moderate Sedation | Laterality: Bilateral

## 2018-04-10 MED ORDER — FENTANYL CITRATE (PF) 100 MCG/2ML IJ SOLN
INTRAMUSCULAR | Status: AC
  Filled 2018-04-10: qty 2

## 2018-04-10 MED ORDER — SODIUM CHLORIDE 0.9% FLUSH: 3.0000 mL | INTRAVENOUS | Status: DC | PRN

## 2018-04-10 MED ORDER — ASPIRIN 81 MG PO CHEW
81.0000 mg | CHEWABLE_TABLET | ORAL | Status: AC
  Administered 2018-04-10: 81 mg via ORAL

## 2018-04-10 MED ORDER — FENTANYL CITRATE (PF) 100 MCG/2ML IJ SOLN
INTRAMUSCULAR | Status: DC | PRN
  Administered 2018-04-10: 25 ug via INTRAVENOUS

## 2018-04-10 MED ORDER — IOPAMIDOL (ISOVUE-300) INJECTION 61%
INTRAVENOUS | Status: DC | PRN
  Administered 2018-04-10: 65 mL via INTRA_ARTERIAL

## 2018-04-10 MED ORDER — SODIUM CHLORIDE 0.9 % IV SOLN: INTRAVENOUS | Status: DC

## 2018-04-10 MED ORDER — SODIUM CHLORIDE 0.9 % IV SOLN
INTRAVENOUS | Status: DC
  Administered 2018-04-10: 09:00:00 via INTRAVENOUS

## 2018-04-10 MED ORDER — HEPARIN (PORCINE) IN NACL 1000-0.9 UT/500ML-% IV SOLN
INTRAVENOUS | Status: AC
  Filled 2018-04-10: qty 1000

## 2018-04-10 MED ORDER — MIDAZOLAM HCL 2 MG/2ML IJ SOLN
INTRAMUSCULAR | Status: AC
  Filled 2018-04-10: qty 2

## 2018-04-10 MED ORDER — SODIUM CHLORIDE 0.9 % IV SOLN: 250.0000 mL | INTRAVENOUS | Status: DC | PRN

## 2018-04-10 MED ORDER — VERAPAMIL HCL 2.5 MG/ML IV SOLN
INTRAVENOUS | Status: AC
  Filled 2018-04-10: qty 2

## 2018-04-10 MED ORDER — SODIUM CHLORIDE 0.9% FLUSH
3.0000 mL | Freq: Two times a day (BID) | INTRAVENOUS | Status: DC
  Administered 2018-04-10: 3 mL via INTRAVENOUS

## 2018-04-10 MED ORDER — ASPIRIN 81 MG PO CHEW
CHEWABLE_TABLET | ORAL | Status: AC
  Filled 2018-04-10: qty 1

## 2018-04-10 MED ORDER — HEPARIN SODIUM (PORCINE) 1000 UNIT/ML IJ SOLN
INTRAMUSCULAR | Status: AC
  Filled 2018-04-10: qty 1

## 2018-04-10 MED ORDER — SODIUM CHLORIDE 0.9% FLUSH: 3.0000 mL | Freq: Two times a day (BID) | INTRAVENOUS | Status: DC

## 2018-04-10 MED ORDER — ONDANSETRON HCL 4 MG/2ML IJ SOLN: 4.0000 mg | Freq: Four times a day (QID) | INTRAMUSCULAR | Status: DC | PRN

## 2018-04-10 MED ORDER — MIDAZOLAM HCL 2 MG/2ML IJ SOLN
INTRAMUSCULAR | Status: DC | PRN
  Administered 2018-04-10 (×2): 1 mg via INTRAVENOUS

## 2018-04-10 MED ORDER — ACETAMINOPHEN 325 MG PO TABS: 650.0000 mg | ORAL_TABLET | ORAL | Status: DC | PRN

## 2018-04-10 SURGICAL SUPPLY — 13 items
CATH INFINITI 5FR ANG PIGTAIL (CATHETERS) ×2 IMPLANT
CATH INFINITI 5FR JK (CATHETERS) ×2 IMPLANT
CATH SWANZ 7F THERMO (CATHETERS) ×2 IMPLANT
DEVICE RAD TR BAND REGULAR (VASCULAR PRODUCTS) ×2 IMPLANT
KIT MANI 3VAL PERCEP (MISCELLANEOUS) ×2 IMPLANT
KIT RIGHT HEART (MISCELLANEOUS) ×2 IMPLANT
NEEDLE PERC 18GX7CM (NEEDLE) ×2 IMPLANT
NEEDLE PERC 21GX4CM (NEEDLE) ×2 IMPLANT
PACK CARDIAC CATH (CUSTOM PROCEDURE TRAY) ×2 IMPLANT
SHEATH AVANTI 7FRX11 (SHEATH) ×2 IMPLANT
SHEATH RAIN 4/5FR (SHEATH) ×2 IMPLANT
SHEATH RAIN RADIAL 21G 6FR (SHEATH) ×2 IMPLANT
WIRE ROSEN-J .035X260CM (WIRE) ×2 IMPLANT

## 2018-04-10 NOTE — Progress Notes (Signed)
Has upcoming appt with me.  Will d/w pt at appt.

## 2018-04-10 NOTE — Interval H&P Note (Signed)
History and Physical Interval Note:  04/10/2018 10:14 AM  Nicole Sims  has presented today for surgery, with the diagnosis of LT and RT Cath  Exertional dyspnea  The various methods of treatment have been discussed with the patient and family. After consideration of risks, benefits and other options for treatment, the patient has consented to  Procedure(s): RIGHT/LEFT HEART CATH AND CORONARY ANGIOGRAPHY (Bilateral) as a surgical intervention .  The patient's history has been reviewed, patient examined, no change in status, stable for surgery.  I have reviewed the patient's chart and labs.  Questions were answered to the patient's satisfaction.     Kathlyn Sacramento

## 2018-04-11 ENCOUNTER — Telehealth: Payer: Self-pay | Admitting: Cardiovascular Disease

## 2018-04-11 NOTE — Telephone Encounter (Signed)
Patient calling to discuss recent Cath test results  Please call

## 2018-04-11 NOTE — Telephone Encounter (Signed)
Left a message to call back.

## 2018-04-12 NOTE — Telephone Encounter (Signed)
Dr. Fletcher Anon,  This patient was calling to discuss her cath results.  Before we call her back, was she supposed to have a diuretic prescribed?

## 2018-04-12 NOTE — Telephone Encounter (Signed)
Patient returning call.

## 2018-04-13 NOTE — Telephone Encounter (Signed)
Returned the call to the patient. She read the results of the cardiac cath on my chart and wanted a better understanding of the results. She has been educated on the results and informed that Dr. Fletcher Anon can explain it in more detail at her follow up appointment.  She has been informed that she will not be started on a diuretic at this time but that will also be discussed at her appointment. She has verbalized her understanding.

## 2018-04-13 NOTE — Telephone Encounter (Signed)
Patient calling to check on status °Please call  °

## 2018-04-25 ENCOUNTER — Other Ambulatory Visit (INDEPENDENT_AMBULATORY_CARE_PROVIDER_SITE_OTHER): Payer: Medicare Other

## 2018-04-25 DIAGNOSIS — R739 Hyperglycemia, unspecified: Secondary | ICD-10-CM | POA: Diagnosis not present

## 2018-04-25 DIAGNOSIS — E7800 Pure hypercholesterolemia, unspecified: Secondary | ICD-10-CM

## 2018-04-25 DIAGNOSIS — D473 Essential (hemorrhagic) thrombocythemia: Secondary | ICD-10-CM

## 2018-04-25 DIAGNOSIS — I10 Essential (primary) hypertension: Secondary | ICD-10-CM

## 2018-04-25 DIAGNOSIS — D75839 Thrombocytosis, unspecified: Secondary | ICD-10-CM

## 2018-04-25 LAB — LIPID PANEL
Cholesterol: 170 mg/dL (ref 0–200)
HDL: 53.7 mg/dL (ref >39.00)
LDL Cholesterol: 96 mg/dL (ref 0–99)
NONHDL: 116.59
TRIGLYCERIDES: 105 mg/dL (ref 0.0–149.0)
Total CHOL/HDL Ratio: 3
VLDL: 21 mg/dL (ref 0.0–40.0)

## 2018-04-25 LAB — CBC WITH DIFFERENTIAL/PLATELET
Basophils Absolute: 0.1 10*3/uL (ref 0.0–0.1)
Basophils Relative: 0.6 % (ref 0.0–3.0)
EOS ABS: 0.2 10*3/uL (ref 0.0–0.7)
Eosinophils Relative: 2.5 % (ref 0.0–5.0)
HCT: 39 % (ref 36.0–46.0)
Hemoglobin: 12.6 g/dL (ref 12.0–15.0)
Lymphocytes Relative: 39.5 % (ref 12.0–46.0)
Lymphs Abs: 3.5 10*3/uL (ref 0.7–4.0)
MCHC: 32.4 g/dL (ref 30.0–36.0)
MCV: 83.8 fl (ref 78.0–100.0)
MONO ABS: 0.6 10*3/uL (ref 0.1–1.0)
MONOS PCT: 7.4 % (ref 3.0–12.0)
NEUTROS ABS: 4.4 10*3/uL (ref 1.4–7.7)
Neutrophils Relative %: 50 % (ref 43.0–77.0)
PLATELETS: 454 10*3/uL — AB (ref 150.0–400.0)
RBC: 4.66 Mil/uL (ref 3.87–5.11)
RDW: 14.6 % (ref 11.5–15.5)
WBC: 8.8 10*3/uL (ref 4.0–10.5)

## 2018-04-25 LAB — BASIC METABOLIC PANEL
BUN: 14 mg/dL (ref 6–23)
CO2: 28 meq/L (ref 19–32)
Calcium: 9.3 mg/dL (ref 8.4–10.5)
Chloride: 106 mEq/L (ref 96–112)
Creatinine, Ser: 1.08 mg/dL (ref 0.40–1.20)
GFR: 53.62 mL/min — AB (ref >60.00)
Glucose, Bld: 103 mg/dL — ABNORMAL HIGH (ref 70–99)
POTASSIUM: 4.6 meq/L (ref 3.5–5.1)
SODIUM: 140 meq/L (ref 135–145)

## 2018-04-25 LAB — HEPATIC FUNCTION PANEL
ALBUMIN: 3.9 g/dL (ref 3.5–5.2)
ALK PHOS: 90 U/L (ref 39–117)
ALT: 13 U/L (ref 0–35)
AST: 13 U/L (ref 0–37)
BILIRUBIN DIRECT: 0.1 mg/dL (ref 0.0–0.3)
TOTAL PROTEIN: 7.5 g/dL (ref 6.0–8.3)
Total Bilirubin: 0.4 mg/dL (ref 0.2–1.2)

## 2018-04-25 LAB — HEMOGLOBIN A1C: Hgb A1c MFr Bld: 6.2 % (ref 4.6–6.5)

## 2018-04-27 ENCOUNTER — Ambulatory Visit (INDEPENDENT_AMBULATORY_CARE_PROVIDER_SITE_OTHER): Payer: Medicare Other | Admitting: Internal Medicine

## 2018-04-27 ENCOUNTER — Encounter: Payer: Self-pay | Admitting: Internal Medicine

## 2018-04-27 VITALS — BP 128/78 | HR 80 | Temp 98.1°F | Resp 18 | Wt 240.8 lb

## 2018-04-27 DIAGNOSIS — K219 Gastro-esophageal reflux disease without esophagitis: Secondary | ICD-10-CM

## 2018-04-27 DIAGNOSIS — G4730 Sleep apnea, unspecified: Secondary | ICD-10-CM

## 2018-04-27 DIAGNOSIS — E7800 Pure hypercholesterolemia, unspecified: Secondary | ICD-10-CM

## 2018-04-27 DIAGNOSIS — D649 Anemia, unspecified: Secondary | ICD-10-CM

## 2018-04-27 DIAGNOSIS — I5030 Unspecified diastolic (congestive) heart failure: Secondary | ICD-10-CM

## 2018-04-27 DIAGNOSIS — Z6841 Body Mass Index (BMI) 40.0 and over, adult: Secondary | ICD-10-CM | POA: Diagnosis not present

## 2018-04-27 DIAGNOSIS — L900 Lichen sclerosus et atrophicus: Secondary | ICD-10-CM

## 2018-04-27 DIAGNOSIS — Z0000 Encounter for general adult medical examination without abnormal findings: Secondary | ICD-10-CM

## 2018-04-27 DIAGNOSIS — D75839 Thrombocytosis, unspecified: Secondary | ICD-10-CM

## 2018-04-27 DIAGNOSIS — E559 Vitamin D deficiency, unspecified: Secondary | ICD-10-CM

## 2018-04-27 DIAGNOSIS — D473 Essential (hemorrhagic) thrombocythemia: Secondary | ICD-10-CM

## 2018-04-27 DIAGNOSIS — I10 Essential (primary) hypertension: Secondary | ICD-10-CM

## 2018-04-27 DIAGNOSIS — R0981 Nasal congestion: Secondary | ICD-10-CM | POA: Diagnosis not present

## 2018-04-27 DIAGNOSIS — R739 Hyperglycemia, unspecified: Secondary | ICD-10-CM

## 2018-04-27 NOTE — Assessment & Plan Note (Addendum)
Physical today 04/27/18.  PAP 12/10/14.  Mammogram scheduled for 08/2018.  Colonscopy 07/19/16 - hyperplastic polyp.

## 2018-04-27 NOTE — Progress Notes (Signed)
Patient ID: Nicole Sims, female   DOB: [birth year], 67 y.o.   MRN: [id number]   Subjective:    Patient ID: Nicole Sims, female    DOB: [birth year], 67 y.o.   MRN: [id number]  HPI  Patient here for her physical exam.  She has recently been evaluated by cardiology - Dr Fletcher Anon.  Sestamibi - normal.  Heart cath - normal coronaries.  Recommended two week monitor.  Recommended restarting metoprolol.  Her heart cath revealed normal LV function.  Right heart cath with mildly elevated filling pressures, high normal pulmonary pressure and normal cardiac output.  Had mentioned that she had mild diastolic heart failure.  Discussed low dose diuretic.  We discussed this today.  Discussed her untreated sleep apnea.  Discussed importance of treating.  She is not using cpap.  She does report some nasal stuffiness and drainage.  Gland - right sore.  Acid reflux controlled. Taking singulair.  Discussed using astelin.  No abdominal pain.  Bowels moving.     Past Medical History:  Diagnosis Date  . Anemia   . Anxiety   . Asthma   . Chronic headaches   . Diverticulosis   . Environmental allergies   . GERD (gastroesophageal reflux disease)   . Heart murmur   . Hypercholesterolemia   . Hyperglycemia   . Hypertension   . Osteoarthritis   . Palpitations   . Thrombocytosis ([place])   . Urinary incontinence    mixed   Past Surgical History:  Procedure Laterality Date  . BLADDER SURGERY     x2   [facility]  . BREAST CYST ASPIRATION Bilateral 2005   approximate year  . CARDIAC CATHETERIZATION     Nehemiah Massed  . CERVICAL CONE BIOPSY     CIS  . CHOLECYSTECTOMY    . COLONOSCOPY WITH PROPOFOL N/A 07/19/2016   Procedure: COLONOSCOPY WITH PROPOFOL;  Surgeon: Manya Silvas, MD;  Location: [facility] ENDOSCOPY;  Service: Endoscopy;  Laterality: N/A;  . ESOPHAGOGASTRODUODENOSCOPY (EGD) WITH PROPOFOL N/A 07/19/2016   Procedure: ESOPHAGOGASTRODUODENOSCOPY (EGD) WITH PROPOFOL;  Surgeon: Manya Silvas, MD;   Location: [facility] ENDOSCOPY;  Service: Endoscopy;  Laterality: N/A;  . KNEE ARTHROSCOPY  08/13/08  . knee replacement and revision     left  . RECTOCELE REPAIR    . RIGHT/LEFT HEART CATH AND CORONARY ANGIOGRAPHY Bilateral 04/10/2018   Procedure: RIGHT/LEFT HEART CATH AND CORONARY ANGIOGRAPHY;  Surgeon: Wellington Hampshire, MD;  Location: [facility];  Service: Cardiovascular;  Laterality: Bilateral;  . ROTATOR CUFF REPAIR     bilateral  . SHOULDER SURGERY  11/17/05  . TONSILLECTOMY  1962  . VAGINAL HYSTERECTOMY  1974   abnormal pap and carcinoma in situ   Family History  Problem Relation Age of Onset  . Diabetes Mellitus II Father   . Thyroid disease Father   . Breast cancer Maternal Aunt   . Heart Problems Brother   . Colon cancer Neg Hx   . Kidney cancer Neg Hx   . Bladder Cancer Neg Hx    Social History   Socioeconomic History  . Marital status: Married    Spouse name: Not on file  . Number of children: Not on file  . Years of education: Not on file  . Highest education level: Not on file  Occupational History  . Not on file  Social Needs  . Financial resource strain: Not on file  . Food insecurity:    Worry: Not on file  Inability: Not on file  . Transportation needs:    Medical: Not on file    Non-medical: Not on file  Tobacco Use  . Smoking status: Former Smoker    Last attempt to quit: 08/17/1983    Years since quitting: 34.7  . Smokeless tobacco: Never Used  Substance and Sexual Activity  . Alcohol use: Yes    Alcohol/week: 0.0 standard drinks    Comment: rarely  . Drug use: No  . Sexual activity: Not on file  Lifestyle  . Physical activity:    Days per week: Not on file    Minutes per session: Not on file  . Stress: Not on file  Relationships  . Social connections:    Talks on phone: Not on file    Gets together: Not on file    Attends religious service: Not on file    Active member of club or organization: Not on file    Attends meetings of  clubs or organizations: Not on file    Relationship status: Not on file  Other Topics Concern  . Not on file  Social History Narrative  . Not on file    Outpatient Encounter Medications as of 04/27/2018  Medication Sig  . albuterol (PROVENTIL HFA;VENTOLIN HFA) 108 (90 BASE) MCG/ACT inhaler Inhale 2 puffs into the lungs every 4 (four) hours as needed. (Patient taking differently: Inhale 2 puffs into the lungs every 4 (four) hours as needed for wheezing or shortness of breath. )  . albuterol (PROVENTIL) (2.5 MG/3ML) 0.083% nebulizer solution Take 3 mLs (2.5 mg total) by nebulization every 6 (six) hours as needed for wheezing or shortness of breath.  Marland Kitchen aspirin EC 81 MG tablet Take 81 mg by mouth daily.  Marland Kitchen azelastine (ASTELIN) 0.1 % nasal spray Place 1 spray into both nostrils 2 (two) times daily. Use in each nostril as directed  . budesonide-formoterol (SYMBICORT) 80-4.5 MCG/ACT inhaler Inhale 2 puffs into the lungs 2 (two) times daily. (Patient taking differently: Inhale 2 puffs into the lungs 2 (two) times daily as needed (short of breath). )  . Calcium Citrate-Vitamin D (CALCIUM CITRATE + D PO) Take 1 tablet by mouth daily. 500/400  . Cholecalciferol (VITAMIN D-3) 1000 units CAPS Take 1 capsule by mouth daily.   . clobetasol ointment (TEMOVATE) 7.09 % Apply 1 application topically 2 (two) times daily.   . magnesium oxide (MAG-OX) 400 MG tablet Take 400 mg daily by mouth.  . metoprolol succinate (TOPROL-XL) 50 MG 24 hr tablet Take 50 mg by mouth daily. Take with or immediately following a meal.   . montelukast (SINGULAIR) 10 MG tablet TAKE 1 TABLET BY MOUTH AT  BEDTIME (Patient taking differently: Take 10 mg by mouth at bedtime. )  . Multiple Vitamin (MULTIVITAMIN) tablet Take 1 tablet by mouth daily.  . naproxen sodium (ALEVE) 220 MG tablet Take 220-440 mg by mouth daily as needed (pain).  . NONFORMULARY OR COMPOUNDED ITEM Place 1 spray into the nose 2 (two) times daily. Budesonide + Saline  Irrigation/Rinse  . ondansetron (ZOFRAN) 4 MG tablet TAKE 1 TABLET(4 MG) BY MOUTH TWICE DAILY AS NEEDED FOR NAUSEA OR VOMITING (Patient taking differently: Take 4 mg by mouth every 8 (eight) hours as needed for nausea or vomiting. )  . RABEprazole (ACIPHEX) 20 MG tablet Take 20 mg by mouth 2 (two) times daily.  Marland Kitchen rOPINIRole (REQUIP) 2 MG tablet Take 2.5 mg by mouth daily.   . rosuvastatin (CRESTOR) 10 MG tablet TAKE 1 TABLET  BY MOUTH  DAILY (Patient taking differently: Take 5 mg by mouth every Monday, Wednesday, and Friday. )  . venlafaxine XR (EFFEXOR-XR) 150 MG 24 hr capsule TAKE 1 CAPSULE BY MOUTH  EVERY DAY WITH BREAKFAST (Patient taking differently: Take 150 mg by mouth daily with breakfast. TAKE 1 CAPSULE BY MOUTH EVERY DAY WITH BREAKFAST)  . vitamin B-12 (CYANOCOBALAMIN) 1000 MCG tablet Take 1,000 mcg by mouth daily.   No facility-administered encounter medications on file as of 04/27/2018.     Review of Systems  Constitutional: Negative for appetite change and unexpected weight change.  HENT: Positive for congestion and postnasal drip. Negative for sinus pressure.   Respiratory: Negative for cough and chest tightness.        Breathing stable.    Cardiovascular: Negative for chest pain, palpitations and leg swelling.  Gastrointestinal: Negative for abdominal pain, diarrhea, nausea and vomiting.  Genitourinary: Negative for difficulty urinating and dysuria.  Musculoskeletal: Negative for joint swelling and myalgias.  Skin: Negative for color change and rash.  Neurological: Negative for dizziness and headaches.  Psychiatric/Behavioral: Negative for agitation and dysphoric mood.       Objective:     Blood pressure rechecked by me:  138/78  Physical Exam  Constitutional: She is oriented to person, place, and time. She appears well-developed and well-nourished. No distress.  HENT:  Nose: Nose normal.  Mouth/Throat: Oropharynx is clear and moist.  Eyes: Right eye exhibits no  discharge. Left eye exhibits no discharge. No scleral icterus.  Neck: Neck supple. No thyromegaly present.  Cardiovascular: Normal rate and regular rhythm.  Pulmonary/Chest: Breath sounds normal. No accessory muscle usage. No tachypnea. No respiratory distress. She has no decreased breath sounds. She has no wheezes. She has no rhonchi. Right breast exhibits no inverted nipple, no mass, no nipple discharge and no tenderness (no axillary adenopathy). Left breast exhibits no inverted nipple, no mass, no nipple discharge and no tenderness (no axilarry adenopathy).  Abdominal: Soft. Bowel sounds are normal. There is no tenderness.  Musculoskeletal: She exhibits no edema or tenderness.  Lymphadenopathy:    She has no cervical adenopathy.  Neurological: She is alert and oriented to person, place, and time.  Skin: No rash noted. No erythema.  Psychiatric: She has a normal mood and affect. Her behavior is normal.    BP 128/78 (BP Location: Left Arm, Patient Position: Sitting, Cuff Size: Normal)   Pulse 80   Temp 98.1 F (36.7 C) (Oral)   Resp 18   Wt 240 lb 12.8 oz (109.2 kg)   SpO2 98%   BMI 40.07 kg/m  Wt Readings from Last 3 Encounters:  04/27/18 240 lb 12.8 oz (109.2 kg)  04/10/18 236 lb 8 oz (107.3 kg)  04/04/18 239 lb 12 oz (108.7 kg)     Lab Results  Component Value Date   WBC 8.8 04/25/2018   HGB 12.6 04/25/2018   HCT 39.0 04/25/2018   PLT 454.0 (H) 04/25/2018   GLUCOSE 103 (H) 04/25/2018   CHOL 170 04/25/2018   TRIG 105.0 04/25/2018   HDL 53.70 04/25/2018   LDLDIRECT 172.8 09/17/2013   LDLCALC 96 04/25/2018   ALT 13 04/25/2018   AST 13 04/25/2018   NA 140 04/25/2018   K 4.6 04/25/2018   CL 106 04/25/2018   CREATININE 1.08 04/25/2018   BUN 14 04/25/2018   CO2 28 04/25/2018   TSH 2.17 08/03/2017   INR 0.9 07/02/2013   HGBA1C 6.2 04/25/2018   MICROALBUR 1.1 04/28/2015  Assessment & Plan:   Problem List Items Addressed This Visit    Anemia    Has been  evaluated by Dr Ma Hillock.  Follow cbc.        Relevant Orders   CBC with Differential/Platelet   BMI 40.0-44.9, adult ([place])    Discussed diet and exercise.  Follow.        Diastolic heart failure (HCC)    Mild noted on 03/2018 heart cath (Dr Fletcher Anon).  Discussed with her today.  Discussed possible low dose diuretic.  Will hold.  Refer to pulmonary for further evaluation and treatment of her sleep apnea.        GERD (gastroesophageal reflux disease)    Controlled on current regimen.        Health care maintenance    Physical today 04/27/18.  PAP 12/10/14.  Mammogram scheduled for 08/2018.  Colonscopy 07/19/16 - hyperplastic polyp.        Hypercholesterolemia    On crestor.  Low cholesterol diet and exercise.  Follow lipid panel and liver function tests.        Relevant Orders   Hepatic function panel   Lipid panel   Hyperglycemia    Low carb diet and exercise.  Follow met b and a1c.        Relevant Orders   Hemoglobin A1c   Hypertension    Blood pressure under good control.  Continue same medication regimen.  Follow pressures.  Follow metabolic panel.        Relevant Orders   TSH   Basic metabolic panel   Lichen sclerosus    Has been followed by gyn.        Thrombocytosis ([place])    Follow cbc.       Vitamin D deficiency    Follow vitamin D level.         Other Visit Diagnoses    Routine general medical examination at a health care facility    -  Primary       Einar Pheasant, MD

## 2018-04-30 ENCOUNTER — Encounter: Payer: Self-pay | Admitting: Internal Medicine

## 2018-04-30 DIAGNOSIS — G4730 Sleep apnea, unspecified: Secondary | ICD-10-CM

## 2018-04-30 NOTE — Assessment & Plan Note (Signed)
Has known sleep apnea.  Not using cpap.  Has been years since had sleep study.  Refer to pulmonary for further evaluation and treatment.

## 2018-04-30 NOTE — Assessment & Plan Note (Signed)
Has been evaluated by Dr Ma Hillock.  Follow cbc.

## 2018-04-30 NOTE — Assessment & Plan Note (Signed)
Low carb diet and exercise.  Follow met b and a1c.

## 2018-04-30 NOTE — Assessment & Plan Note (Signed)
Has been followed by gyn.   

## 2018-04-30 NOTE — Assessment & Plan Note (Signed)
Follow vitamin D level.  

## 2018-04-30 NOTE — Assessment & Plan Note (Signed)
On crestor.  Low cholesterol diet and exercise.  Follow lipid panel and liver function tests.   

## 2018-04-30 NOTE — Assessment & Plan Note (Signed)
Mild noted on 03/2018 heart cath (Dr Fletcher Anon).  Discussed with her today.  Discussed possible low dose diuretic.  Will hold.  Refer to pulmonary for further evaluation and treatment of her sleep apnea.

## 2018-04-30 NOTE — Assessment & Plan Note (Signed)
Discussed diet and exercise.  Follow.  

## 2018-04-30 NOTE — Assessment & Plan Note (Signed)
Controlled on current regimen.   

## 2018-04-30 NOTE — Assessment & Plan Note (Signed)
Blood pressure under good control.  Continue same medication regimen.  Follow pressures.  Follow metabolic panel.   

## 2018-04-30 NOTE — Assessment & Plan Note (Signed)
Follow cbc.  

## 2018-05-05 ENCOUNTER — Encounter: Payer: Self-pay | Admitting: Internal Medicine

## 2018-05-05 ENCOUNTER — Ambulatory Visit: Payer: Medicare Other | Admitting: Internal Medicine

## 2018-05-05 VITALS — BP 134/90 | HR 73 | Resp 16 | Ht 65.0 in | Wt 237.0 lb

## 2018-05-05 DIAGNOSIS — K219 Gastro-esophageal reflux disease without esophagitis: Secondary | ICD-10-CM | POA: Diagnosis not present

## 2018-05-05 DIAGNOSIS — G4733 Obstructive sleep apnea (adult) (pediatric): Secondary | ICD-10-CM

## 2018-05-05 DIAGNOSIS — J3089 Other allergic rhinitis: Secondary | ICD-10-CM | POA: Diagnosis not present

## 2018-05-05 NOTE — Patient Instructions (Addendum)
Will send for CPAP titration.  Will send for mask fitting clinic.

## 2018-05-05 NOTE — Progress Notes (Signed)
[facility]    Assessment and Plan:  Obstructive sleep apnea.  --intolerant of CPAP due to uncomfortable mask.  --Will send to mask fitting clinic.  --Will send for CPAP titration study and adjust pressure as needed.   Chronic diastolic congestive heart failure. - Evidence of mild diastolic congestive heart failure was seen on heart catheterization. - Sleep apnea can contribute to diastolic dysfunction, therefore will try to maximize CPAP therapy.  Chronic rhinitis. - Currently appears controlled with Astelin, nasal rinse. - Continue to follow-up with ENT.  Orders Placed This Encounter  Procedures  . Cpap titration      Date: 05/05/2018  MRN# [id number] Nicole Sims [birth year]    Nicole Sims is a 67 y.o. old female seen in consultation for chief complaint of:    Chief Complaint  Patient presents with  . Follow-up    Former Dr.Mcquaid patient transferring to [employer]:  Dr. Ronda Fairly wants patient evaluated for possible sleep apnea.    HPI:   The patient is a 67 year old female, had been Dr. Lake Bells but now wants to switch providers to come down to [facility], she was noted over the years to have recurrent episodes of asthmatic bronchitis, and severe rhinitis with upper airway cough.  She was recommended to be on Singulair, Flonase, antihistamine, Astelin.  She also underwent imaging which showed chronic right sphenoid sinusitis, she was therefore referred to ENT.  She was asked to trial off of Symbicort.   She has a history of OSA, she does not like it, so she stopped it for a while. However recently her cardiologist felt that it would helpful for her so she restarted just last week after being off of it for about 2-3 months. She was using it sporadically even when she was on it. She was initially tested in 2005, her machine is about 2-3 years ago but has not undergone any new sleep testing since 2005.   She feels that her breathing is heavy, and has  occasional cough, she saw ENT, had allergy testing and is now on astelin, nasal rinse. She is no longer on symbicort as it causes thrush and felt that it did nothing for her breathing.  Gastroesophageal reflux disease: Continue taking antacid therapy  **Right and left heart cath 04/10/2018>> normal coronary arteries, wedge pressure 13, LVEDP 17, cardiac output 7.6.  Consistent with mild diastolic heart failure. **CBC 08/03/2017>> absolute eosinophil count 300, as high as 400 in the past. **CT maxillofacial 08/15/2017>> images personally reviewed.  Findings consistent with chronic right sphenoid sinusitis. **PFT 08/04/2017>> FEV1 98%, ratio 88, no significant bronchodilator response, FVC 85%, DLCO 82%. **Chest x-ray May 2018>> clear lungs. Exhaled nitric oxide testing December 2018 was 8 PPB IgE -6    PMHX:   Past Medical History:  Diagnosis Date  . Anemia   . Anxiety   . Asthma   . Chronic headaches   . Diverticulosis   . Environmental allergies   . GERD (gastroesophageal reflux disease)   . Heart murmur   . Hypercholesterolemia   . Hyperglycemia   . Hypertension   . Osteoarthritis   . Palpitations   . Thrombocytosis ([place])   . Urinary incontinence    mixed   Surgical Hx:  Past Surgical History:  Procedure Laterality Date  . BLADDER SURGERY     x2   [facility]  . BREAST CYST ASPIRATION Bilateral 2005   approximate year  . CARDIAC CATHETERIZATION     Nehemiah Massed  .  CERVICAL CONE BIOPSY     CIS  . CHOLECYSTECTOMY    . COLONOSCOPY WITH PROPOFOL N/A 07/19/2016   Procedure: COLONOSCOPY WITH PROPOFOL;  Surgeon: Manya Silvas, MD;  Location: [facility] ENDOSCOPY;  Service: Endoscopy;  Laterality: N/A;  . ESOPHAGOGASTRODUODENOSCOPY (EGD) WITH PROPOFOL N/A 07/19/2016   Procedure: ESOPHAGOGASTRODUODENOSCOPY (EGD) WITH PROPOFOL;  Surgeon: Manya Silvas, MD;  Location: [facility] ENDOSCOPY;  Service: Endoscopy;  Laterality: N/A;  . KNEE ARTHROSCOPY  08/13/08  . knee replacement  and revision     left  . RECTOCELE REPAIR    . RIGHT/LEFT HEART CATH AND CORONARY ANGIOGRAPHY Bilateral 04/10/2018   Procedure: RIGHT/LEFT HEART CATH AND CORONARY ANGIOGRAPHY;  Surgeon: Wellington Hampshire, MD;  Location: [facility] CV LAB;  Service: Cardiovascular;  Laterality: Bilateral;  . ROTATOR CUFF REPAIR     bilateral  . SHOULDER SURGERY  11/17/05  . TONSILLECTOMY  1962  . VAGINAL HYSTERECTOMY  1974   abnormal pap and carcinoma in situ   Family Hx:  Family History  Problem Relation Age of Onset  . Diabetes Mellitus II Father   . Thyroid disease Father   . Breast cancer Maternal Aunt   . Heart Problems Brother   . Colon cancer Neg Hx   . Kidney cancer Neg Hx   . Bladder Cancer Neg Hx    Social Hx:   Social History   Tobacco Use  . Smoking status: Former Smoker    Last attempt to quit: 08/17/1983    Years since quitting: 34.7  . Smokeless tobacco: Never Used  Substance Use Topics  . Alcohol use: Yes    Alcohol/week: 0.0 standard drinks    Comment: rarely  . Drug use: No   Medication:    Current Outpatient Medications:  .  albuterol (PROVENTIL HFA;VENTOLIN HFA) 108 (90 BASE) MCG/ACT inhaler, Inhale 2 puffs into the lungs every 4 (four) hours as needed. (Patient taking differently: Inhale 2 puffs into the lungs every 4 (four) hours as needed for wheezing or shortness of breath. ), Disp: 1 Inhaler, Rfl: 3 .  albuterol (PROVENTIL) (2.5 MG/3ML) 0.083% nebulizer solution, Take 3 mLs (2.5 mg total) by nebulization every 6 (six) hours as needed for wheezing or shortness of breath., Disp: 150 mL, Rfl: 1 .  aspirin EC 81 MG tablet, Take 81 mg by mouth daily., Disp: , Rfl:  .  azelastine (ASTELIN) 0.1 % nasal spray, Place 1 spray into both nostrils 2 (two) times daily. Use in each nostril as directed, Disp: 90 mL, Rfl: 1 .  budesonide-formoterol (SYMBICORT) 80-4.5 MCG/ACT inhaler, Inhale 2 puffs into the lungs 2 (two) times daily. (Patient taking differently: Inhale 2 puffs into  the lungs 2 (two) times daily as needed (short of breath). ), Disp: 1 Inhaler, Rfl: 3 .  Calcium Citrate-Vitamin D (CALCIUM CITRATE + D PO), Take 1 tablet by mouth daily. 500/400, Disp: , Rfl:  .  Cholecalciferol (VITAMIN D-3) 1000 units CAPS, Take 1 capsule by mouth daily. , Disp: , Rfl:  .  clobetasol ointment (TEMOVATE) 2.95 %, Apply 1 application topically 2 (two) times daily. , Disp: , Rfl: 0 .  magnesium oxide (MAG-OX) 400 MG tablet, Take 400 mg daily by mouth., Disp: , Rfl:  .  metoprolol succinate (TOPROL-XL) 50 MG 24 hr tablet, Take 50 mg by mouth daily. Take with or immediately following a meal. , Disp: , Rfl:  .  montelukast (SINGULAIR) 10 MG tablet, TAKE 1 TABLET BY MOUTH AT  BEDTIME (Patient taking differently:  Take 10 mg by mouth at bedtime. ), Disp: 90 tablet, Rfl: 1 .  Multiple Vitamin (MULTIVITAMIN) tablet, Take 1 tablet by mouth daily., Disp: , Rfl:  .  naproxen sodium (ALEVE) 220 MG tablet, Take 220-440 mg by mouth daily as needed (pain)., Disp: , Rfl:  .  NONFORMULARY OR COMPOUNDED ITEM, Place 1 spray into the nose 2 (two) times daily. Budesonide + Saline Irrigation/Rinse, Disp: , Rfl:  .  ondansetron (ZOFRAN) 4 MG tablet, TAKE 1 TABLET(4 MG) BY MOUTH TWICE DAILY AS NEEDED FOR NAUSEA OR VOMITING (Patient taking differently: Take 4 mg by mouth every 8 (eight) hours as needed for nausea or vomiting. ), Disp: 20 tablet, Rfl: 0 .  RABEprazole (ACIPHEX) 20 MG tablet, Take 20 mg by mouth 2 (two) times daily., Disp: , Rfl:  .  rOPINIRole (REQUIP) 2 MG tablet, Take 2.5 mg by mouth daily. , Disp: , Rfl: 1 .  rosuvastatin (CRESTOR) 10 MG tablet, TAKE 1 TABLET BY MOUTH  DAILY (Patient taking differently: Take 10 mg by mouth every Monday, Wednesday, and Friday. ), Disp: 90 tablet, Rfl: 1 .  venlafaxine XR (EFFEXOR-XR) 150 MG 24 hr capsule, TAKE 1 CAPSULE BY MOUTH  EVERY DAY WITH BREAKFAST (Patient taking differently: Take 150 mg by mouth daily with breakfast. TAKE 1 CAPSULE BY MOUTH EVERY DAY  WITH BREAKFAST), Disp: 90 capsule, Rfl: 1 .  vitamin B-12 (CYANOCOBALAMIN) 1000 MCG tablet, Take 1,000 mcg by mouth daily., Disp: , Rfl:    Allergies:  Augmentin [amoxicillin-pot clavulanate]; Bactrim [sulfamethoxazole-trimethoprim]; Doxycycline; Hydrocodone-acetaminophen; Pseudoephedrine; Relafen [nabumetone]; and Shellfish allergy  Review of Systems: Gen:  Denies  fever, sweats, chills HEENT: Denies blurred vision, double vision. bleeds, sore throat Cvc:  No dizziness, chest pain. Resp:   Denies cough or sputum production, shortness of breath Gi: Denies swallowing difficulty, stomach pain. Gu:  Denies bladder incontinence, burning urine Ext:   No Joint pain, stiffness. Skin: No skin rash,  hives  Endoc:  No polyuria, polydipsia. Psych: No depression, insomnia. Other:  All other systems were reviewed with the patient and were negative other that what is mentioned in the HPI.   Physical Examination:   VS: BP 134/90 (BP Location: Left Arm, Cuff Size: Large)   Pulse 73   Resp 16   Ht 5\' 5"  (1.651 m)   Wt 237 lb (107.5 kg)   SpO2 100%   BMI 39.44 kg/m   General Appearance: No distress  Neuro:without focal findings,  speech normal,  HEENT: PERRLA, EOM intact.   Pulmonary: normal breath sounds, No wheezing.  CardiovascularNormal S1,S2.  No m/r/g.   Abdomen: Benign, Soft, non-tender. Renal:  No costovertebral tenderness  GU:  No performed at this time. Endoc: No evident thyromegaly, no signs of acromegaly. Skin:   warm, no rashes, no ecchymosis  Extremities: normal, no cyanosis, clubbing.  Other findings:    LABORATORY PANEL:   CBC No results for input(s): WBC, HGB, HCT, PLT in the last 168 hours. ------------------------------------------------------------------------------------------------------------------  Chemistries  No results for input(s): NA, K, CL, CO2, GLUCOSE, BUN, CREATININE, CALCIUM, MG, AST, ALT, ALKPHOS, BILITOT in the last 168 hours.  Invalid  input(s): GFRCGP ------------------------------------------------------------------------------------------------------------------  Cardiac Enzymes No results for input(s): TROPONINI in the last 168 hours. ------------------------------------------------------------  RADIOLOGY:  No results found.     Thank  you for the consultation and for allowing [facility] to assist in the care of your patient. Our recommendations are noted above.  Please contact us if we can be  of further service.   Marda Stalker, M.D., F.C.C.P.  Board Certified in Internal Medicine, Pulmonary Medicine, [place], and Sleep Medicine.  [facility] Office Number: [phone]   05/05/2018

## 2018-05-09 ENCOUNTER — Encounter: Payer: Self-pay | Admitting: Cardiovascular Disease

## 2018-05-09 ENCOUNTER — Ambulatory Visit: Payer: Medicare Other | Admitting: Cardiovascular Disease

## 2018-05-09 ENCOUNTER — Other Ambulatory Visit
Admission: RE | Admit: 2018-05-09 | Discharge: 2018-05-09 | Disposition: A | Discharge status: Home / Self Care | Payer: Medicare Other | Source: Ambulatory Visit | Attending: Cardiovascular Disease | Admitting: Cardiovascular Disease

## 2018-05-09 VITALS — BP 132/80 | HR 71 | Ht 65.0 in | Wt 234.8 lb

## 2018-05-09 DIAGNOSIS — R002 Palpitations: Secondary | ICD-10-CM | POA: Diagnosis not present

## 2018-05-09 DIAGNOSIS — E785 Hyperlipidemia, unspecified: Secondary | ICD-10-CM

## 2018-05-09 DIAGNOSIS — R0602 Shortness of breath: Secondary | ICD-10-CM | POA: Diagnosis not present

## 2018-05-09 LAB — BRAIN NATRIURETIC PEPTIDE: B Natriuretic Peptide: 43 pg/mL (ref 0.0–100.0)

## 2018-05-09 LAB — FIBRIN DERIVATIVES D-DIMER (ARMC ONLY): FIBRIN DERIVATIVES D-DIMER (ARMC): 738.77 ng{FEU}/mL — AB (ref 0.00–499.00)

## 2018-05-09 NOTE — Patient Instructions (Signed)
Medication Instructions: Your physician recommends that you continue on your current medications as directed. Please refer to the Current Medication list given to you today.  If you need a refill on your cardiac medications before your next appointment, please call your pharmacy.   Labwork: Your provider would like for you to have the following labs today: D-Dimer and BNP. Please have these done today at the [facility].  Follow-Up: Your physician wants you to follow-up as needed with Dr. Fletcher Anon.  Thank you for choosing Heartcare at [facility]!

## 2018-05-09 NOTE — Progress Notes (Signed)
Cardiology Office Note   Date:  05/09/2018   ID:  Nicole Sims, DOB 1950/11/19, MRN [id number]  PCP:  Einar Pheasant, MD  Cardiologist:   Kathlyn Sacramento, MD   Chief Complaint  Patient presents with  . other    f/u Cath 04/10/18 and stress test 01/02/18.Medications reviewed verbally.       History of Present Illness: Nicole Sims is a 67 y.o. female who is here today for follow-up visit regarding r exertional dyspnea and palpitations.  She has multiple chronic medical conditions that include hyperlipidemia, sleep apnea on CPAP, essential hypertension and obesity.  She was seen by Dr. Nehemiah Massed recently for evaluation of exertional dyspnea, fatigue and palpitations.  She underwent an echocardiogram and a treadmill Myoview and both were unremarkable.  She also had a 24-hour Holter monitor that showed no significant arrhythmia.  She is a former smoker and quit in 1985. She had worsening palpitations after stopping metoprolol. Given her symptoms, I proceeded with a right and left cardiac catheterization.  Coronary angiography showed normal coronary arteries.  Right heart catheterization showed mildly elevated filling pressures with a wedge pressure of 13 mmHg, high normal pulmonary pressure and normal cardiac output.  The patient was seen by pulmonary and was scheduled for a sleep study.  The patient complains of continued shortness of breath.  She also reports recent sinus congestion and feeling tired overall.  She has discomfort on the right side of her back.  Past Medical History:  Diagnosis Date  . Anemia   . Anxiety   . Asthma   . Chronic headaches   . Diverticulosis   . Environmental allergies   . GERD (gastroesophageal reflux disease)   . Heart murmur   . Hypercholesterolemia   . Hyperglycemia   . Hypertension   . Osteoarthritis   . Palpitations   . Thrombocytosis ([place])   . Urinary incontinence    mixed    Past Surgical History:  Procedure Laterality Date  .  BLADDER SURGERY     x2   [facility]  . BREAST CYST ASPIRATION Bilateral 2005   approximate year  . CARDIAC CATHETERIZATION     Nehemiah Massed  . CERVICAL CONE BIOPSY     CIS  . CHOLECYSTECTOMY    . COLONOSCOPY WITH PROPOFOL N/A 07/19/2016   Procedure: COLONOSCOPY WITH PROPOFOL;  Surgeon: Manya Silvas, MD;  Location: [facility] ENDOSCOPY;  Service: Endoscopy;  Laterality: N/A;  . ESOPHAGOGASTRODUODENOSCOPY (EGD) WITH PROPOFOL N/A 07/19/2016   Procedure: ESOPHAGOGASTRODUODENOSCOPY (EGD) WITH PROPOFOL;  Surgeon: Manya Silvas, MD;  Location: [facility] ENDOSCOPY;  Service: Endoscopy;  Laterality: N/A;  . KNEE ARTHROSCOPY  08/13/08  . knee replacement and revision     left  . RECTOCELE REPAIR    . RIGHT/LEFT HEART CATH AND CORONARY ANGIOGRAPHY Bilateral 04/10/2018   Procedure: RIGHT/LEFT HEART CATH AND CORONARY ANGIOGRAPHY;  Surgeon: Wellington Hampshire, MD;  Location: [place] CV LAB;  Service: Cardiovascular;  Laterality: Bilateral;  . ROTATOR CUFF REPAIR     bilateral  . SHOULDER SURGERY  11/17/05  . TONSILLECTOMY  1962  . VAGINAL HYSTERECTOMY  1974   abnormal pap and carcinoma in situ     Current Outpatient Medications  Medication Sig Dispense Refill  . albuterol (PROVENTIL HFA;VENTOLIN HFA) 108 (90 BASE) MCG/ACT inhaler Inhale 2 puffs into the lungs every 4 (four) hours as needed. (Patient taking differently: Inhale 2 puffs into the lungs every 4 (four) hours as needed for wheezing or shortness  of breath. ) 1 Inhaler 3  . albuterol (PROVENTIL) (2.5 MG/3ML) 0.083% nebulizer solution Take 3 mLs (2.5 mg total) by nebulization every 6 (six) hours as needed for wheezing or shortness of breath. 150 mL 1  . aspirin EC 81 MG tablet Take 81 mg by mouth daily.    Marland Kitchen azelastine (ASTELIN) 0.1 % nasal spray Place 1 spray into both nostrils 2 (two) times daily. Use in each nostril as directed 90 mL 1  . budesonide-formoterol (SYMBICORT) 80-4.5 MCG/ACT inhaler Inhale 2 puffs into the lungs 2 (two)  times daily. (Patient taking differently: Inhale 2 puffs into the lungs 2 (two) times daily as needed (short of breath). ) 1 Inhaler 3  . Calcium Citrate-Vitamin D (CALCIUM CITRATE + D PO) Take 1 tablet by mouth daily. 500/400    . Cholecalciferol (VITAMIN D-3) 1000 units CAPS Take 1 capsule by mouth daily.     . clobetasol ointment (TEMOVATE) 2.84 % Apply 1 application topically 2 (two) times daily.   0  . magnesium oxide (MAG-OX) 400 MG tablet Take 400 mg daily by mouth.    . metoprolol succinate (TOPROL-XL) 50 MG 24 hr tablet Take 50 mg by mouth daily. Take with or immediately following a meal.     . montelukast (SINGULAIR) 10 MG tablet TAKE 1 TABLET BY MOUTH AT  BEDTIME (Patient taking differently: Take 10 mg by mouth at bedtime. ) 90 tablet 1  . Multiple Vitamin (MULTIVITAMIN) tablet Take 1 tablet by mouth daily.    . naproxen sodium (ALEVE) 220 MG tablet Take 220-440 mg by mouth daily as needed (pain).    . NONFORMULARY OR COMPOUNDED ITEM Place 1 spray into the nose 2 (two) times daily. Budesonide + Saline Irrigation/Rinse    . ondansetron (ZOFRAN) 4 MG tablet TAKE 1 TABLET(4 MG) BY MOUTH TWICE DAILY AS NEEDED FOR NAUSEA OR VOMITING (Patient taking differently: Take 4 mg by mouth every 8 (eight) hours as needed for nausea or vomiting. ) 20 tablet 0  . RABEprazole (ACIPHEX) 20 MG tablet Take 20 mg by mouth 2 (two) times daily.    Marland Kitchen rOPINIRole (REQUIP) 2 MG tablet Take 2.5 mg by mouth daily.   1  . rosuvastatin (CRESTOR) 10 MG tablet TAKE 1 TABLET BY MOUTH  DAILY (Patient taking differently: Take 10 mg by mouth every Monday, Wednesday, and Friday. ) 90 tablet 1  . venlafaxine XR (EFFEXOR-XR) 150 MG 24 hr capsule TAKE 1 CAPSULE BY MOUTH  EVERY DAY WITH BREAKFAST (Patient taking differently: Take 150 mg by mouth daily with breakfast. TAKE 1 CAPSULE BY MOUTH EVERY DAY WITH BREAKFAST) 90 capsule 1  . vitamin B-12 (CYANOCOBALAMIN) 1000 MCG tablet Take 1,000 mcg by mouth daily.     No current  facility-administered medications for this visit.     Allergies:   Augmentin [amoxicillin-pot clavulanate]; Bactrim [sulfamethoxazole-trimethoprim]; Doxycycline; Hydrocodone-acetaminophen; Pseudoephedrine; Relafen [nabumetone]; and Shellfish allergy    Social History:  The patient  reports that she quit smoking about 34 years ago. She has never used smokeless tobacco. She reports that she drinks alcohol. She reports that she does not use drugs.   Family History:  The patient's family history includes Breast cancer in her maternal aunt; Diabetes Mellitus II in her father; Heart Problems in her brother; Thyroid disease in her father.    ROS:  Please see the history of present illness.   Otherwise, review of systems are positive for none.   All other systems are reviewed and negative.  PHYSICAL EXAM: VS:  BP 132/80 (BP Location: Left Arm, Patient Position: Sitting, Cuff Size: Large)   Pulse 71   Ht 5\' 5"  (1.651 m)   Wt 234 lb 12 oz (106.5 kg)   BMI 39.06 kg/m  , BMI Body mass index is 39.06 kg/m. GEN: Well nourished, well developed, in no acute distress  HEENT: normal  Neck: no JVD, carotid bruits, or masses Cardiac: RRR; no murmurs, rubs, or gallops,no edema  Respiratory:  clear to auscultation bilaterally, normal work of breathing GI: soft, nontender, nondistended, + BS MS: no deformity or atrophy  Skin: warm and dry, no rash Neuro:  Strength and sensation are intact Psych: euthymic mood, full affect Right radial pulses not palpable.  No hematoma.  The right hand is warm.  EKG:  EKG is ordered today. The ekg ordered today demonstrates normal sinus rhythm with no significant ST or T wave changes.   Recent Labs: 08/03/2017: TSH 2.17 04/25/2018: ALT 13; BUN 14; Creatinine, Ser 1.08; Hemoglobin 12.6; Platelets 454.0; Potassium 4.6; Sodium 140    Lipid Panel    Component Value Date/Time   CHOL 170 04/25/2018 0810   TRIG 105.0 04/25/2018 0810   HDL 53.70 04/25/2018 0810    CHOLHDL 3 04/25/2018 0810   VLDL 21.0 04/25/2018 0810   LDLCALC 96 04/25/2018 0810   LDLDIRECT 172.8 09/17/2013 0950      Wt Readings from Last 3 Encounters:  05/09/18 234 lb 12 oz (106.5 kg)  05/05/18 237 lb (107.5 kg)  04/27/18 240 lb 12.8 oz (109.2 kg)       PAD Screen 04/04/2018  Previous PAD dx? No  Previous surgical procedure? No  Pain with walking? Yes  Subsides with rest? Yes  Feet/toe relief with dangling? No  Painful, non-healing ulcers? No  Extremities discolored? No      ASSESSMENT AND PLAN:  1.  Exertional dyspnea and chest tightness: Recent cardiac catheterization showed normal coronary arteries.  Right heart catheterization did show slight elevation of pulmonary capillary wedge pressure.  Thus, some mild degree of diastolic heart failure is possible.  However, this does not explain the degree of her symptoms.  She seems uncomfortable today with her shortness of breath and congestion.  I am going to obtain d-dimer and BNP.  If d-dimer is elevated, she will need CTA to evaluate for pulmonary embolism.  If BNP is elevated, I will consider a small dose diuretic.  I do not see clear evidence of volume overload by physical exam.  2.  Palpitations: Resolved after resuming metoprolol.  3.  Hyperlipidemia: Currently on rosuvastatin.    Disposition:   FU with me as needed.  Signed,  Kathlyn Sacramento, MD  05/09/2018 4:22 PM    [address]

## 2018-05-10 ENCOUNTER — Telehealth: Payer: Self-pay | Admitting: *Deleted

## 2018-05-10 ENCOUNTER — Ambulatory Visit
Admission: RE | Admit: 2018-05-10 | Discharge: 2018-05-10 | Disposition: A | Discharge status: Home / Self Care | Payer: Medicare Other | Source: Ambulatory Visit | Attending: Cardiovascular Disease | Admitting: Cardiovascular Disease

## 2018-05-10 DIAGNOSIS — R7989 Other specified abnormal findings of blood chemistry: Secondary | ICD-10-CM | POA: Insufficient documentation

## 2018-05-10 DIAGNOSIS — I700 Atherosclerosis of aorta: Secondary | ICD-10-CM | POA: Diagnosis not present

## 2018-05-10 DIAGNOSIS — J984 Other disorders of lung: Secondary | ICD-10-CM | POA: Insufficient documentation

## 2018-05-10 DIAGNOSIS — R0602 Shortness of breath: Secondary | ICD-10-CM | POA: Diagnosis not present

## 2018-05-10 MED ORDER — IOHEXOL 350 MG/ML SOLN
75.0000 mL | Freq: Once | INTRAVENOUS | Status: AC | PRN
  Administered 2018-05-10: 75 mL via INTRAVENOUS

## 2018-05-10 NOTE — Telephone Encounter (Signed)
Patient made aware of results and verbalized understanding.  Patient has been made aware to go to the [facility] now for her CTA of the chest as she is being worked in. She verbalized her understanding.

## 2018-05-10 NOTE — Telephone Encounter (Signed)
Patient would like cta results.

## 2018-05-10 NOTE — Telephone Encounter (Signed)
Patient made aware of preliminary report. She has been informed that the office will call back with the final results for her tomorrow.

## 2018-05-10 NOTE — Telephone Encounter (Signed)
-----   Message from Wellington Hampshire, MD sent at 05/10/2018  8:18 AM EDT ----- Inform patient that BNP was normal but D-dimer was elevated. Schedule CTA of chest to evaluate for pulmonary embolism today.

## 2018-05-11 ENCOUNTER — Encounter: Payer: Self-pay | Admitting: Internal Medicine

## 2018-05-12 ENCOUNTER — Telehealth: Payer: Self-pay

## 2018-05-12 NOTE — Telephone Encounter (Signed)
Copied from [place] [phone]. Topic: Quick Communication - See Telephone Encounter >> May 12, 2018 12:34 PM Antonieta Iba C wrote: CRM for notification. See Telephone encounter for: 05/12/18.  Pt says that she was seen by Dr. Smith Mince, she would like to be advised on her results and what is her next step?   Pt would like to be advises.   CB:   [phone]

## 2018-05-12 NOTE — Telephone Encounter (Signed)
Patient says that Dr. Fletcher Anon was supposed to be reaching out to PCP regarding CT scan results and what the next steps would be

## 2018-05-15 NOTE — Telephone Encounter (Signed)
Per review of schedule, she has an appt with me Thursday.

## 2018-05-15 NOTE — Telephone Encounter (Signed)
Patient had questions about her CT that was not related to pulmonary she requested to come in for an office visit with you to discuss. She says she just does not feel well and feels that it would help put her mind at ease if she came in for an appt She saw Dr. Ashby Dawes on 9/20.

## 2018-05-15 NOTE — Telephone Encounter (Signed)
Dr Fletcher Anon saw pt for w/up for sob.  He ordered CT chest.  No clot in the lungs.  There was noted to be some small nodules (could be from old infection, scarring,etc).  Given her persistent sob and CT, I would like to get her back in with pulmonary for further evaluation.  If agreeable, let me know.  She saw Dr Lake Bells previously.  Is this who she wants to see?

## 2018-05-18 ENCOUNTER — Encounter: Payer: Self-pay | Admitting: Internal Medicine

## 2018-05-18 ENCOUNTER — Telehealth: Payer: Self-pay | Admitting: Internal Medicine

## 2018-05-18 ENCOUNTER — Ambulatory Visit: Payer: Medicare Other | Admitting: Internal Medicine

## 2018-05-18 ENCOUNTER — Ambulatory Visit
Admission: RE | Admit: 2018-05-18 | Discharge: 2018-05-18 | Disposition: A | Discharge status: Home / Self Care | Payer: Medicare Other | Source: Ambulatory Visit | Attending: Internal Medicine | Admitting: Internal Medicine

## 2018-05-18 VITALS — BP 126/72 | HR 62 | Temp 97.9°F | Resp 18 | Wt 234.8 lb

## 2018-05-18 DIAGNOSIS — K219 Gastro-esophageal reflux disease without esophagitis: Secondary | ICD-10-CM

## 2018-05-18 DIAGNOSIS — R634 Abnormal weight loss: Secondary | ICD-10-CM | POA: Insufficient documentation

## 2018-05-18 DIAGNOSIS — N261 Atrophy of kidney (terminal): Secondary | ICD-10-CM | POA: Diagnosis not present

## 2018-05-18 DIAGNOSIS — E7800 Pure hypercholesterolemia, unspecified: Secondary | ICD-10-CM

## 2018-05-18 DIAGNOSIS — R109 Unspecified abdominal pain: Secondary | ICD-10-CM

## 2018-05-18 DIAGNOSIS — R1010 Upper abdominal pain, unspecified: Secondary | ICD-10-CM | POA: Diagnosis not present

## 2018-05-18 DIAGNOSIS — D649 Anemia, unspecified: Secondary | ICD-10-CM | POA: Diagnosis not present

## 2018-05-18 DIAGNOSIS — I700 Atherosclerosis of aorta: Secondary | ICD-10-CM | POA: Insufficient documentation

## 2018-05-18 DIAGNOSIS — R1111 Vomiting without nausea: Secondary | ICD-10-CM | POA: Diagnosis not present

## 2018-05-18 DIAGNOSIS — R739 Hyperglycemia, unspecified: Secondary | ICD-10-CM

## 2018-05-18 DIAGNOSIS — I10 Essential (primary) hypertension: Secondary | ICD-10-CM

## 2018-05-18 LAB — BASIC METABOLIC PANEL
BUN: 16 mg/dL (ref 6–23)
CO2: 28 meq/L (ref 19–32)
Calcium: 9.7 mg/dL (ref 8.4–10.5)
Chloride: 106 mEq/L (ref 96–112)
Creatinine, Ser: 1.02 mg/dL (ref 0.40–1.20)
GFR: 57.27 mL/min — AB (ref >60.00)
Glucose, Bld: 101 mg/dL — ABNORMAL HIGH (ref 70–99)
POTASSIUM: 4.7 meq/L (ref 3.5–5.1)
Sodium: 140 mEq/L (ref 135–145)

## 2018-05-18 LAB — CBC WITH DIFFERENTIAL/PLATELET
Basophils Absolute: 0.1 10*3/uL (ref 0.0–0.1)
Basophils Relative: 0.8 % (ref 0.0–3.0)
EOS ABS: 0.2 10*3/uL (ref 0.0–0.7)
Eosinophils Relative: 1.8 % (ref 0.0–5.0)
HCT: 39.1 % (ref 36.0–46.0)
HEMOGLOBIN: 12.6 g/dL (ref 12.0–15.0)
LYMPHS ABS: 3.2 10*3/uL (ref 0.7–4.0)
Lymphocytes Relative: 35 % (ref 12.0–46.0)
MCHC: 32.2 g/dL (ref 30.0–36.0)
MCV: 85 fl (ref 78.0–100.0)
MONO ABS: 0.7 10*3/uL (ref 0.1–1.0)
Monocytes Relative: 8.1 % (ref 3.0–12.0)
NEUTROS PCT: 54.3 % (ref 43.0–77.0)
Neutro Abs: 5 10*3/uL (ref 1.4–7.7)
Platelets: 438 10*3/uL — ABNORMAL HIGH (ref 150.0–400.0)
RBC: 4.6 Mil/uL (ref 3.87–5.11)
RDW: 15 % (ref 11.5–15.5)
WBC: 9.2 10*3/uL (ref 4.0–10.5)

## 2018-05-18 LAB — LIPASE: LIPASE: 12 U/L (ref 11.0–59.0)

## 2018-05-18 LAB — HEPATIC FUNCTION PANEL
ALT: 11 U/L (ref 0–35)
AST: 14 U/L (ref 0–37)
Albumin: 4.1 g/dL (ref 3.5–5.2)
Alkaline Phosphatase: 85 U/L (ref 39–117)
BILIRUBIN DIRECT: 0.1 mg/dL (ref 0.0–0.3)
BILIRUBIN TOTAL: 0.5 mg/dL (ref 0.2–1.2)
Total Protein: 7.5 g/dL (ref 6.0–8.3)

## 2018-05-18 LAB — AMYLASE: AMYLASE: 54 U/L (ref 27–131)

## 2018-05-18 NOTE — Telephone Encounter (Signed)
Patient has left. Results are in Epic patient waiting for call about results.

## 2018-05-18 NOTE — Telephone Encounter (Signed)
Ct result called by Steward Drone, CT tech from [place], Results verified and read back in chart. She states patient is waiting for discharge from provider. Spoke with Nicole Sims, [place] who place me on hold to get the providers nurse. While on hold Crystal disconnected. Nicole Sims given call back number

## 2018-05-18 NOTE — Telephone Encounter (Signed)
Since she is still having issues with burning in her throat and discomfort, ok to try carafate.  Should be hearing about appt with GI.  Let us know if any problems.

## 2018-05-18 NOTE — Telephone Encounter (Signed)
Pt aware.

## 2018-05-18 NOTE — Telephone Encounter (Signed)
Patient has some carafate that was given to her by GI previously and was wondering if it is ok for her to take that until she can be seen by GI

## 2018-05-18 NOTE — Telephone Encounter (Signed)
Please call and notify pt that her CT scan reveals no acute abnormality.  Does have moderate amount of stool in the colon.  Continue miralax.  Make sure bowels are moving.  Given her persistent pain, weight loss and decreased appetite, I would like to refer her to GI for further evaluation.  If agreeable, let me know and I will place the order for the referral.  (lab results are pending).

## 2018-05-18 NOTE — Progress Notes (Signed)
Patient ID: Nicole Sims, female   DOB: 03-Mar-1951, 67 y.o.   MRN: [id number]   Subjective:    Patient ID: Nicole Sims, female    DOB: 04-18-1951, 67 y.o.   MRN: [id number]  HPI  Patient here as a work in to discuss recent CT results.  She was seen by cardiology recently for persistent sob.   Had previous echo and treadmill myoview - unremarkable.  Had 24 hour holter monitor that showed no significant arrhythmia.  D dimer was elevated.  Had CT to confirm no PE.  CT negative for PE.  Did have small nodular lesions in the right lung.  Discussed further evaluation.  Will have Dr Genevive Bi review CT.  She reports she has noticed recently having increased abdominal discomfort.  Increased gas.  Has been having discomfort under her ribs for a while, but over the last week, the abdominal discomfort has increased.  Had some emesis.  Decreased appetite.  Has lost weight.  miralax helping bowels move.  Throat irritated.  Some choking when swallowing.  Taking aciphex.  No blood in the stool.     Past Medical History:  Diagnosis Date  . Anemia   . Anxiety   . Asthma   . Chronic headaches   . Diverticulosis   . Environmental allergies   . GERD (gastroesophageal reflux disease)   . Heart murmur   . Hypercholesterolemia   . Hyperglycemia   . Hypertension   . Osteoarthritis   . Palpitations   . Thrombocytosis ([place])   . Urinary incontinence    mixed   Past Surgical History:  Procedure Laterality Date  . BLADDER SURGERY     x2   [facility]  . BREAST CYST ASPIRATION Bilateral 2005   approximate year  . CARDIAC CATHETERIZATION     Nehemiah Massed  . CERVICAL CONE BIOPSY     CIS  . CHOLECYSTECTOMY    . COLONOSCOPY WITH PROPOFOL N/A 07/19/2016   Procedure: COLONOSCOPY WITH PROPOFOL;  Surgeon: Manya Silvas, MD;  Location: [facility] ENDOSCOPY;  Service: Endoscopy;  Laterality: N/A;  . ESOPHAGOGASTRODUODENOSCOPY (EGD) WITH PROPOFOL N/A 07/19/2016   Procedure: ESOPHAGOGASTRODUODENOSCOPY (EGD) WITH  PROPOFOL;  Surgeon: Manya Silvas, MD;  Location: [facility] ENDOSCOPY;  Service: Endoscopy;  Laterality: N/A;  . KNEE ARTHROSCOPY  08/13/08  . knee replacement and revision     left  . RECTOCELE REPAIR    . RIGHT/LEFT HEART CATH AND CORONARY ANGIOGRAPHY Bilateral 04/10/2018   Procedure: RIGHT/LEFT HEART CATH AND CORONARY ANGIOGRAPHY;  Surgeon: Wellington Hampshire, MD;  Location: [place] CV LAB;  Service: Cardiovascular;  Laterality: Bilateral;  . ROTATOR CUFF REPAIR     bilateral  . SHOULDER SURGERY  11/17/05  . TONSILLECTOMY  1962  . VAGINAL HYSTERECTOMY  1974   abnormal pap and carcinoma in situ   Family History  Problem Relation Age of Onset  . Diabetes Mellitus II Father   . Thyroid disease Father   . Breast cancer Maternal Aunt   . Heart Problems Brother   . Colon cancer Neg Hx   . Kidney cancer Neg Hx   . Bladder Cancer Neg Hx    Social History   Socioeconomic History  . Marital status: Married    Spouse name: Not on file  . Number of children: Not on file  . Years of education: Not on file  . Highest education level: Not on file  Occupational History  . Not on file  Social Needs  .  Financial resource strain: Not on file  . Food insecurity:    Worry: Not on file    Inability: Not on file  . Transportation needs:    Medical: Not on file    Non-medical: Not on file  Tobacco Use  . Smoking status: Former Smoker    Last attempt to quit: 08/17/1983    Years since quitting: 34.7  . Smokeless tobacco: Never Used  Substance and Sexual Activity  . Alcohol use: Yes    Alcohol/week: 0.0 standard drinks    Comment: rarely  . Drug use: No  . Sexual activity: Not on file  Lifestyle  . Physical activity:    Days per week: Not on file    Minutes per session: Not on file  . Stress: Not on file  Relationships  . Social connections:    Talks on phone: Not on file    Gets together: Not on file    Attends religious service: Not on file    Active member of club or  organization: Not on file    Attends meetings of clubs or organizations: Not on file    Relationship status: Not on file  Other Topics Concern  . Not on file  Social History Narrative  . Not on file    Outpatient Encounter Medications as of 05/18/2018  Medication Sig  . albuterol (PROVENTIL HFA;VENTOLIN HFA) 108 (90 BASE) MCG/ACT inhaler Inhale 2 puffs into the lungs every 4 (four) hours as needed. (Patient taking differently: Inhale 2 puffs into the lungs every 4 (four) hours as needed for wheezing or shortness of breath. )  . albuterol (PROVENTIL) (2.5 MG/3ML) 0.083% nebulizer solution Take 3 mLs (2.5 mg total) by nebulization every 6 (six) hours as needed for wheezing or shortness of breath.  Marland Kitchen aspirin EC 81 MG tablet Take 81 mg by mouth daily.  Marland Kitchen azelastine (ASTELIN) 0.1 % nasal spray Place 1 spray into both nostrils 2 (two) times daily. Use in each nostril as directed  . budesonide-formoterol (SYMBICORT) 80-4.5 MCG/ACT inhaler Inhale 2 puffs into the lungs 2 (two) times daily. (Patient taking differently: Inhale 2 puffs into the lungs 2 (two) times daily as needed (short of breath). )  . Calcium Citrate-Vitamin D (CALCIUM CITRATE + D PO) Take 1 tablet by mouth daily. 500/400  . Cholecalciferol (VITAMIN D-3) 1000 units CAPS Take 1 capsule by mouth daily.   . clobetasol ointment (TEMOVATE) 9.03 % Apply 1 application topically 2 (two) times daily.   . magnesium oxide (MAG-OX) 400 MG tablet Take 400 mg daily by mouth.  . metoprolol succinate (TOPROL-XL) 50 MG 24 hr tablet Take 50 mg by mouth daily. Take with or immediately following a meal.   . montelukast (SINGULAIR) 10 MG tablet TAKE 1 TABLET BY MOUTH AT  BEDTIME (Patient taking differently: Take 10 mg by mouth at bedtime. )  . Multiple Vitamin (MULTIVITAMIN) tablet Take 1 tablet by mouth daily.  . naproxen sodium (ALEVE) 220 MG tablet Take 220-440 mg by mouth daily as needed (pain).  . NONFORMULARY OR COMPOUNDED ITEM Place 1 spray into the  nose 2 (two) times daily. Budesonide + Saline Irrigation/Rinse  . ondansetron (ZOFRAN) 4 MG tablet TAKE 1 TABLET(4 MG) BY MOUTH TWICE DAILY AS NEEDED FOR NAUSEA OR VOMITING (Patient taking differently: Take 4 mg by mouth every 8 (eight) hours as needed for nausea or vomiting. )  . RABEprazole (ACIPHEX) 20 MG tablet Take 20 mg by mouth 2 (two) times daily.  Marland Kitchen rOPINIRole (REQUIP)  2 MG tablet Take 2.5 mg by mouth daily.   . rosuvastatin (CRESTOR) 10 MG tablet TAKE 1 TABLET BY MOUTH  DAILY (Patient taking differently: Take 10 mg by mouth every Monday, Wednesday, and Friday. )  . venlafaxine XR (EFFEXOR-XR) 150 MG 24 hr capsule TAKE 1 CAPSULE BY MOUTH  EVERY DAY WITH BREAKFAST (Patient taking differently: Take 150 mg by mouth daily with breakfast. TAKE 1 CAPSULE BY MOUTH EVERY DAY WITH BREAKFAST)  . vitamin B-12 (CYANOCOBALAMIN) 1000 MCG tablet Take 1,000 mcg by mouth daily.   No facility-administered encounter medications on file as of 05/18/2018.     Review of Systems  Constitutional: Negative for fever.       Decreased appetite.  Weight loss.    HENT: Negative for congestion and sinus pressure.   Respiratory: Negative for cough and chest tightness.   Cardiovascular: Negative for chest pain, palpitations and leg swelling.  Gastrointestinal: Positive for vomiting.       Abdominal pain and bloating with increased gas as outlined.    Genitourinary: Negative for difficulty urinating and dysuria.  Musculoskeletal: Negative for joint swelling and myalgias.  Skin: Negative for color change and rash.  Neurological: Negative for dizziness, light-headedness and headaches.  Psychiatric/Behavioral: Negative for agitation and dysphoric mood.       Objective:    Physical Exam  Constitutional: She appears well-developed and well-nourished. No distress.  HENT:  Nose: Nose normal.  Mouth/Throat: Oropharynx is clear and moist.  Neck: Neck supple. No thyromegaly present.  Cardiovascular: Normal rate and  regular rhythm.  Pulmonary/Chest: Breath sounds normal. No respiratory distress. She has no wheezes.  Abdominal: Soft. Bowel sounds are normal.  Increased pain to palpation over the abdomen.    Musculoskeletal: She exhibits no edema or tenderness.  Lymphadenopathy:    She has no cervical adenopathy.  Psychiatric: She has a normal mood and affect. Her behavior is normal.    BP 126/72 (BP Location: Left Arm, Patient Position: Sitting, Cuff Size: Normal)   Pulse 62   Temp 97.9 F (36.6 C) (Oral)   Resp 18   Wt 234 lb 12.8 oz (106.5 kg)   SpO2 97%   BMI 39.07 kg/m  Wt Readings from Last 3 Encounters:  05/18/18 234 lb 12.8 oz (106.5 kg)  05/09/18 234 lb 12 oz (106.5 kg)  05/05/18 237 lb (107.5 kg)     Lab Results  Component Value Date   WBC 9.2 05/18/2018   HGB 12.6 05/18/2018   HCT 39.1 05/18/2018   PLT 438.0 (H) 05/18/2018   GLUCOSE 101 (H) 05/18/2018   CHOL 170 04/25/2018   TRIG 105.0 04/25/2018   HDL 53.70 04/25/2018   LDLDIRECT 172.8 09/17/2013   LDLCALC 96 04/25/2018   ALT 11 05/18/2018   AST 14 05/18/2018   NA 140 05/18/2018   K 4.7 05/18/2018   CL 106 05/18/2018   CREATININE 1.02 05/18/2018   BUN 16 05/18/2018   CO2 28 05/18/2018   TSH 2.17 08/03/2017   INR 0.9 07/02/2013   HGBA1C 6.2 04/25/2018   MICROALBUR 1.1 04/28/2015    Ct Angio Chest Pe W Or Wo Contrast  Result Date: 05/10/2018 CLINICAL DATA:  Shortness of breath and cardiac palpitations EXAM: CT ANGIOGRAPHY CHEST WITH CONTRAST TECHNIQUE: Multidetector CT imaging of the chest was performed using the standard protocol during bolus administration of intravenous contrast. Multiplanar CT image reconstructions and MIPs were obtained to evaluate the vascular anatomy. CONTRAST:  55m OMNIPAQUE IOHEXOL 350 MG/ML SOLN COMPARISON:  Chest  radiograph August 04, 2017 FINDINGS: Cardiovascular: There is no demonstrable pulmonary embolus. There is no thoracic aortic aneurysm or dissection. Visualized great vessels  appear unremarkable. There are foci of aortic atherosclerosis. There is no appreciable pericardial effusion or pericardial thickening. Mediastinum/Nodes: There is a small benign-appearing calcification in the right lobe of the thyroid. No thyroid mass evident. Thyroid otherwise appears unremarkable. There is no appreciable thoracic adenopathy. No esophageal lesions are evident. Lungs/Pleura: There is bibasilar atelectatic change. There is no lung edema or consolidation. On axial slice 46 series 6, there is a 5 on axial slice 34 series 6, there is a 2 mm nodular opacity in the posterior segment of the right upper lobe. Mm nodular opacity in the lateral segment right middle lobe. No pleural effusion or pleural thickening evident. Upper Abdomen: There is aortic atherosclerosis in the upper abdomen. Gallbladder is absent. There is scarring in the visualized upper pole left kidney. Visualized upper abdominal structures otherwise appear unremarkable. Musculoskeletal: There is degenerative change in the lower thoracic spine. There are no blastic or lytic bone lesions. No evident chest Awwad lesions. Review of the MIP images confirms the above findings. IMPRESSION: 1. No demonstrable pulmonary embolus. No thoracic aortic aneurysm or dissection. There is aortic atherosclerosis. 2. Small nodular lesions in the right lung, largest measuring 5 mm. No follow-up needed if patient is low-risk (and has no known or suspected primary neoplasm). Non-contrast chest CT can be considered in 12 months if patient is high-risk. This recommendation follows the consensus statement: Guidelines for Management of Incidental Pulmonary Nodules Detected on CT Images: From the Fleischner Society 2017; Radiology 2017; 284:228-243. No lung edema or consolidation. 3.  No evident thoracic adenopathy. 4.  Gallbladder absent. Aortic Atherosclerosis (ICD10-I70.0). Electronically Signed   By: Lowella Grip III M.D.   On: 05/10/2018 11:12         Assessment & Plan:   Problem List Items Addressed This Visit    Abdominal pain in female    Increased pain and bloating as outlined.  Increased gas.  Some vomiting.  Decreased appetite.  Weight loss.  Check cbc, amylase, lipase and liver panel.  Bowels are moving.  Given increased pain, will obtain CT abdomen and pelvis.  Continue aciphex. With persistent symptoms and swallowing issues, will refer back to GI for further evaluation.        Anemia    Has previously been evaluated by Dr Ma Hillock.  Recheck cbc.        GERD (gastroesophageal reflux disease)    On aciphex.  With some occasional choking.  Refer to GI for evaluation.        Hypercholesterolemia    On crestor.  Low cholesterol diet and exercise.  Follow lipid panel and liver function tests.        Hyperglycemia    Low carb diet and exercise.  Follow met b and a1c.        Hypertension    Blood pressure under good control.  Continue same medication regimen.  Follow pressures.  Follow metabolic panel.         Other Visit Diagnoses    Abdominal pain, unspecified abdominal location    -  Primary   Relevant Orders   CT Abdomen Pelvis Wo Contrast (Completed)   Amylase (Completed)   Lipase (Completed)   CBC with Differential/Platelet (Completed)   Hepatic function panel (Completed)   Basic metabolic panel (Completed)   Creatinine   Ambulatory referral to Gastroenterology   Abnormal weight loss  Relevant Orders   CT Abdomen Pelvis Wo Contrast (Completed)   Ambulatory referral to Gastroenterology       Einar Pheasant, MD

## 2018-05-21 ENCOUNTER — Encounter: Payer: Self-pay | Admitting: Internal Medicine

## 2018-05-21 NOTE — Assessment & Plan Note (Signed)
On crestor.  Low cholesterol diet and exercise.  Follow lipid panel and liver function tests.   

## 2018-05-21 NOTE — Assessment & Plan Note (Signed)
On aciphex.  With some occasional choking.  Refer to GI for evaluation.

## 2018-05-21 NOTE — Assessment & Plan Note (Signed)
Increased pain and bloating as outlined.  Increased gas.  Some vomiting.  Decreased appetite.  Weight loss.  Check cbc, amylase, lipase and liver panel.  Bowels are moving.  Given increased pain, will obtain CT abdomen and pelvis.  Continue aciphex. With persistent symptoms and swallowing issues, will refer back to GI for further evaluation.

## 2018-05-21 NOTE — Assessment & Plan Note (Signed)
Low carb diet and exercise.  Follow met b and a1c.   

## 2018-05-21 NOTE — Assessment & Plan Note (Signed)
Blood pressure under good control.  Continue same medication regimen.  Follow pressures.  Follow metabolic panel.   

## 2018-05-21 NOTE — Assessment & Plan Note (Signed)
Has previously been evaluated by Dr Ma Hillock.  Recheck cbc.

## 2018-05-25 DIAGNOSIS — K5900 Constipation, unspecified: Secondary | ICD-10-CM | POA: Diagnosis not present

## 2018-05-25 DIAGNOSIS — K581 Irritable bowel syndrome with constipation: Secondary | ICD-10-CM | POA: Diagnosis not present

## 2018-05-30 ENCOUNTER — Ambulatory Visit (HOSPITAL_BASED_OUTPATIENT_CLINIC_OR_DEPARTMENT_OTHER): Payer: Medicare Other | Attending: Internal Medicine | Admitting: Radiology

## 2018-05-30 DIAGNOSIS — G4733 Obstructive sleep apnea (adult) (pediatric): Secondary | ICD-10-CM

## 2018-06-01 DIAGNOSIS — K5900 Constipation, unspecified: Secondary | ICD-10-CM | POA: Diagnosis not present

## 2018-06-08 ENCOUNTER — Ambulatory Visit: Payer: Medicare Other | Attending: Internal Medicine

## 2018-06-08 DIAGNOSIS — G4733 Obstructive sleep apnea (adult) (pediatric): Secondary | ICD-10-CM | POA: Diagnosis not present

## 2018-06-09 DIAGNOSIS — G4733 Obstructive sleep apnea (adult) (pediatric): Secondary | ICD-10-CM | POA: Diagnosis not present

## 2018-06-14 ENCOUNTER — Telehealth: Payer: Self-pay | Admitting: *Deleted

## 2018-06-14 DIAGNOSIS — G4733 Obstructive sleep apnea (adult) (pediatric): Secondary | ICD-10-CM

## 2018-06-14 NOTE — Telephone Encounter (Signed)
Pt aware of results of titration study. Orders placed. She states she received a new machine in 2017. Orders placed to change settings 12-20. She has been to mask fit. Nothing further needed.

## 2018-06-17 ENCOUNTER — Other Ambulatory Visit: Payer: Self-pay | Admitting: Internal Medicine

## 2018-07-06 DIAGNOSIS — K2970 Gastritis, unspecified, without bleeding: Secondary | ICD-10-CM | POA: Diagnosis not present

## 2018-07-06 DIAGNOSIS — R194 Change in bowel habit: Secondary | ICD-10-CM | POA: Diagnosis not present

## 2018-07-06 DIAGNOSIS — K581 Irritable bowel syndrome with constipation: Secondary | ICD-10-CM | POA: Diagnosis not present

## 2018-08-18 ENCOUNTER — Ambulatory Visit (INDEPENDENT_AMBULATORY_CARE_PROVIDER_SITE_OTHER): Payer: Medicare Other | Admitting: Internal Medicine

## 2018-08-18 ENCOUNTER — Encounter: Payer: Self-pay | Admitting: Internal Medicine

## 2018-08-18 ENCOUNTER — Other Ambulatory Visit: Payer: Medicare Other

## 2018-08-18 VITALS — BP 142/92 | HR 85 | Temp 99.0°F | Ht 65.0 in | Wt 230.6 lb

## 2018-08-18 DIAGNOSIS — R110 Nausea: Secondary | ICD-10-CM

## 2018-08-18 DIAGNOSIS — R6889 Other general symptoms and signs: Secondary | ICD-10-CM | POA: Diagnosis not present

## 2018-08-18 DIAGNOSIS — J029 Acute pharyngitis, unspecified: Secondary | ICD-10-CM

## 2018-08-18 DIAGNOSIS — J101 Influenza due to other identified influenza virus with other respiratory manifestations: Secondary | ICD-10-CM

## 2018-08-18 LAB — POCT RAPID STREP A (OFFICE): Rapid Strep A Screen: NEGATIVE

## 2018-08-18 LAB — POC INFLUENZA A&B (BINAX/QUICKVUE)
INFLUENZA A, POC: POSITIVE — AB
Influenza B, POC: NEGATIVE

## 2018-08-18 MED ORDER — ONDANSETRON HCL 4 MG PO TABS: 4.0000 mg | ORAL_TABLET | Freq: Three times a day (TID) | ORAL | 1 refills | Status: DC | PRN

## 2018-08-18 MED ORDER — AZITHROMYCIN 250 MG PO TABS: ORAL_TABLET | ORAL | 0 refills | Status: DC

## 2018-08-18 MED ORDER — OSELTAMIVIR PHOSPHATE 75 MG PO CAPS: 75.0000 mg | ORAL_CAPSULE | Freq: Two times a day (BID) | ORAL | 0 refills | Status: DC

## 2018-08-18 NOTE — Progress Notes (Signed)
Chief Complaint  Patient presents with  . Sore Throat  . URI  . Headache   Sick visit  1. C/o sneezing x 2 days resolved. fever 100.3, post nasal drip, sx's worse in am, h/a ans sore throat, swollen glands in neck, cough productive, achy feeling. No sick contacts, tried otc cough meds tussin CF, mucus relief otc from [facility] strep negative today. Sxs since Weds. But worse yesterday am   Review of Systems  Constitutional: Positive for fever.  HENT: Positive for sore throat.   Eyes: Negative for blurred vision.  Respiratory: Positive for cough and shortness of breath.   Cardiovascular: Negative for chest pain.  Gastrointestinal: Negative for abdominal pain.  Musculoskeletal: Positive for myalgias.  Neurological: Positive for headaches.   Past Medical History:  Diagnosis Date  . Anemia   . Anxiety   . Asthma   . Chronic headaches   . Diverticulosis   . Environmental allergies   . GERD (gastroesophageal reflux disease)   . Heart murmur   . Hypercholesterolemia   . Hyperglycemia   . Hypertension   . Osteoarthritis   . Palpitations   . Thrombocytosis ([place])   . Urinary incontinence    mixed   Past Surgical History:  Procedure Laterality Date  . BLADDER SURGERY     x2   [facility]  . BREAST CYST ASPIRATION Bilateral 2005   approximate year  . CARDIAC CATHETERIZATION     Nehemiah Massed  . CERVICAL CONE BIOPSY     CIS  . CHOLECYSTECTOMY    . COLONOSCOPY WITH PROPOFOL N/A 07/19/2016   Procedure: COLONOSCOPY WITH PROPOFOL;  Surgeon: Manya Silvas, MD;  Location: [facility] ENDOSCOPY;  Service: Endoscopy;  Laterality: N/A;  . ESOPHAGOGASTRODUODENOSCOPY (EGD) WITH PROPOFOL N/A 07/19/2016   Procedure: ESOPHAGOGASTRODUODENOSCOPY (EGD) WITH PROPOFOL;  Surgeon: Manya Silvas, MD;  Location: [facility] ENDOSCOPY;  Service: Endoscopy;  Laterality: N/A;  . KNEE ARTHROSCOPY  08/13/08  . knee replacement and revision     left  . RECTOCELE REPAIR    . RIGHT/LEFT HEART CATH AND CORONARY  ANGIOGRAPHY Bilateral 04/10/2018   Procedure: RIGHT/LEFT HEART CATH AND CORONARY ANGIOGRAPHY;  Surgeon: Wellington Hampshire, MD;  Location: [place] CV LAB;  Service: Cardiovascular;  Laterality: Bilateral;  . ROTATOR CUFF REPAIR     bilateral  . SHOULDER SURGERY  11/17/05  . TONSILLECTOMY  1962  . VAGINAL HYSTERECTOMY  1974   abnormal pap and carcinoma in situ   Family History  Problem Relation Age of Onset  . Diabetes Mellitus II Father   . Thyroid disease Father   . Breast cancer Maternal Aunt   . Heart Problems Brother   . Colon cancer Neg Hx   . Kidney cancer Neg Hx   . Bladder Cancer Neg Hx    Social History   Socioeconomic History  . Marital status: Married    Spouse name: Not on file  . Number of children: Not on file  . Years of education: Not on file  . Highest education level: Not on file  Occupational History  . Not on file  Social Needs  . Financial resource strain: Not on file  . Food insecurity:    Worry: Not on file    Inability: Not on file  . Transportation needs:    Medical: Not on file    Non-medical: Not on file  Tobacco Use  . Smoking status: Former Smoker    Last attempt to quit: 08/17/1983    Years since  quitting: 35.0  . Smokeless tobacco: Never Used  Substance and Sexual Activity  . Alcohol use: Yes    Alcohol/week: 0.0 standard drinks    Comment: rarely  . Drug use: No  . Sexual activity: Not on file  Lifestyle  . Physical activity:    Days per week: Not on file    Minutes per session: Not on file  . Stress: Not on file  Relationships  . Social connections:    Talks on phone: Not on file    Gets together: Not on file    Attends religious service: Not on file    Active member of club or organization: Not on file    Attends meetings of clubs or organizations: Not on file    Relationship status: Not on file  . Intimate partner violence:    Fear of current or ex partner: Not on file    Emotionally abused: Not on file    Physically  abused: Not on file    Forced sexual activity: Not on file  Other Topics Concern  . Not on file  Social History Narrative  . Not on file   Current Meds  Medication Sig  . albuterol (PROVENTIL HFA;VENTOLIN HFA) 108 (90 BASE) MCG/ACT inhaler Inhale 2 puffs into the lungs every 4 (four) hours as needed. (Patient taking differently: Inhale 2 puffs into the lungs every 4 (four) hours as needed for wheezing or shortness of breath. )  . albuterol (PROVENTIL) (2.5 MG/3ML) 0.083% nebulizer solution Take 3 mLs (2.5 mg total) by nebulization every 6 (six) hours as needed for wheezing or shortness of breath.  Marland Kitchen aspirin EC 81 MG tablet Take 81 mg by mouth daily.  Marland Kitchen azelastine (ASTELIN) 0.1 % nasal spray Place 1 spray into both nostrils 2 (two) times daily. Use in each nostril as directed  . budesonide-formoterol (SYMBICORT) 80-4.5 MCG/ACT inhaler Inhale 2 puffs into the lungs 2 (two) times daily. (Patient taking differently: Inhale 2 puffs into the lungs 2 (two) times daily as needed (short of breath). )  . Calcium Citrate-Vitamin D (CALCIUM CITRATE + D PO) Take 1 tablet by mouth daily. 500/400  . Cholecalciferol (VITAMIN D-3) 1000 units CAPS Take 1 capsule by mouth daily.   . clobetasol ointment (TEMOVATE) 1.61 % Apply 1 application topically 2 (two) times daily.   . magnesium oxide (MAG-OX) 400 MG tablet Take 400 mg daily by mouth.  . metoprolol succinate (TOPROL-XL) 50 MG 24 hr tablet Take 50 mg by mouth daily. Take with or immediately following a meal.   . metoprolol succinate (TOPROL-XL) 50 MG 24 hr tablet TAKE 1 TABLET BY MOUTH  EVERY DAY OR IMMEDIATELY  FOLLOWING A MEAL  . montelukast (SINGULAIR) 10 MG tablet TAKE 1 TABLET BY MOUTH AT  BEDTIME (Patient taking differently: Take 10 mg by mouth at bedtime. )  . Multiple Vitamin (MULTIVITAMIN) tablet Take 1 tablet by mouth daily.  . naproxen sodium (ALEVE) 220 MG tablet Take 220-440 mg by mouth daily as needed (pain).  . NONFORMULARY OR COMPOUNDED ITEM  Place 1 spray into the nose 2 (two) times daily. Budesonide + Saline Irrigation/Rinse  . ondansetron (ZOFRAN) 4 MG tablet Take 1 tablet (4 mg total) by mouth every 8 (eight) hours as needed for nausea or vomiting.  . RABEprazole (ACIPHEX) 20 MG tablet Take 20 mg by mouth 2 (two) times daily.  Marland Kitchen rOPINIRole (REQUIP) 2 MG tablet Take 2.5 mg by mouth daily.   . rosuvastatin (CRESTOR) 10 MG tablet TAKE  1 TABLET BY MOUTH  DAILY (Patient taking differently: Take 10 mg by mouth every Monday, Wednesday, and Friday. )  . venlafaxine XR (EFFEXOR-XR) 150 MG 24 hr capsule TAKE 1 CAPSULE BY MOUTH  EVERY DAY WITH BREAKFAST (Patient taking differently: Take 150 mg by mouth daily with breakfast. TAKE 1 CAPSULE BY MOUTH EVERY DAY WITH BREAKFAST)  . vitamin B-12 (CYANOCOBALAMIN) 1000 MCG tablet Take 1,000 mcg by mouth daily.  . [DISCONTINUED] ondansetron (ZOFRAN) 4 MG tablet TAKE 1 TABLET(4 MG) BY MOUTH TWICE DAILY AS NEEDED FOR NAUSEA OR VOMITING (Patient taking differently: Take 4 mg by mouth every 8 (eight) hours as needed for nausea or vomiting. )   Allergies  Allergen Reactions  . Augmentin [Amoxicillin-Pot Clavulanate] Other (See Comments)    Questionable itching  . Bactrim [Sulfamethoxazole-Trimethoprim]   . Doxycycline Itching  . Hydrocodone-Acetaminophen Other (See Comments)    GI distress  . Pseudoephedrine     Itching of the scalp  . Relafen [Nabumetone] Other (See Comments)    Itching/rash  . Shellfish Allergy     Nausea and vomiting    Recent Results (from the past 2160 hour(s))  POCT rapid strep A     Status: None   Collection Time: 08/18/18 11:22 AM  Result Value Ref Range   Rapid Strep A Screen Negative Negative   Objective  Body mass index is 38.37 kg/m. Wt Readings from Last 3 Encounters:  08/18/18 230 lb 9.6 oz (104.6 kg)  05/18/18 234 lb 12.8 oz (106.5 kg)  05/09/18 234 lb 12 oz (106.5 kg)   Temp Readings from Last 3 Encounters:  08/18/18 99 F (37.2 C) (Oral)  05/18/18  97.9 F (36.6 C) (Oral)  04/27/18 98.1 F (36.7 C) (Oral)   BP Readings from Last 3 Encounters:  08/18/18 (!) 142/92  05/18/18 126/72  05/09/18 132/80   Pulse Readings from Last 3 Encounters:  08/18/18 85  05/18/18 62  05/09/18 71    Physical Exam Vitals signs and nursing note reviewed.  Constitutional:      Appearance: Normal appearance. She is well-developed.  HENT:     Head: Normocephalic and atraumatic.     Mouth/Throat:     Mouth: Mucous membranes are moist.     Pharynx: Oropharynx is clear. Posterior oropharyngeal erythema present.  Eyes:     Conjunctiva/sclera: Conjunctivae normal.     Pupils: Pupils are equal, round, and reactive to light.  Cardiovascular:     Rate and Rhythm: Normal rate and regular rhythm.     Heart sounds: Normal heart sounds. No murmur.  Abdominal:     General: Bowel sounds are normal.     Palpations: Abdomen is soft.  Skin:    General: Skin is warm and dry.  Neurological:     General: No focal deficit present.     Mental Status: She is alert and oriented to person, place, and time.     Gait: Gait normal.  Psychiatric:        Attention and Perception: Attention and perception normal.        Mood and Affect: Mood and affect normal.        Speech: Speech normal.        Behavior: Behavior normal. Behavior is cooperative.        Thought Content: Thought content normal.        Cognition and Memory: Cognition and memory normal.        Judgment: Judgment normal.     Assessment  1. Flu a + and sore throat. Strep negative  Plan  1. Zpack, zofran, cold handout   Provider: Dr. Olivia Mackie McLean-Scocuzza-Internal Medicine

## 2018-08-18 NOTE — Patient Instructions (Signed)

## 2018-08-18 NOTE — Progress Notes (Signed)
Pre visit review using our clinic review tool, if applicable. No additional management support is needed unless otherwise documented below in the visit note. 

## 2018-08-25 ENCOUNTER — Other Ambulatory Visit (INDEPENDENT_AMBULATORY_CARE_PROVIDER_SITE_OTHER): Payer: Medicare Other

## 2018-08-25 DIAGNOSIS — I10 Essential (primary) hypertension: Secondary | ICD-10-CM | POA: Diagnosis not present

## 2018-08-25 DIAGNOSIS — E7800 Pure hypercholesterolemia, unspecified: Secondary | ICD-10-CM | POA: Diagnosis not present

## 2018-08-25 DIAGNOSIS — R739 Hyperglycemia, unspecified: Secondary | ICD-10-CM

## 2018-08-25 DIAGNOSIS — D649 Anemia, unspecified: Secondary | ICD-10-CM | POA: Diagnosis not present

## 2018-08-25 LAB — BASIC METABOLIC PANEL
BUN: 16 mg/dL (ref 6–23)
CO2: 27 mEq/L (ref 19–32)
Calcium: 9.6 mg/dL (ref 8.4–10.5)
Chloride: 105 mEq/L (ref 96–112)
Creatinine, Ser: 0.99 mg/dL (ref 0.40–1.20)
GFR: 59.22 mL/min — AB (ref >60.00)
GLUCOSE: 100 mg/dL — AB (ref 70–99)
Potassium: 4 mEq/L (ref 3.5–5.1)
Sodium: 141 mEq/L (ref 135–145)

## 2018-08-25 LAB — CBC WITH DIFFERENTIAL/PLATELET
BASOS ABS: 0.1 10*3/uL (ref 0.0–0.1)
Basophils Relative: 0.9 % (ref 0.0–3.0)
EOS ABS: 0.2 10*3/uL (ref 0.0–0.7)
Eosinophils Relative: 1.9 % (ref 0.0–5.0)
HEMATOCRIT: 40.5 % (ref 36.0–46.0)
Hemoglobin: 13.3 g/dL (ref 12.0–15.0)
LYMPHS PCT: 35.3 % (ref 12.0–46.0)
Lymphs Abs: 3.4 10*3/uL (ref 0.7–4.0)
MCHC: 32.9 g/dL (ref 30.0–36.0)
MCV: 84.9 fl (ref 78.0–100.0)
Monocytes Absolute: 0.7 10*3/uL (ref 0.1–1.0)
Monocytes Relative: 7.1 % (ref 3.0–12.0)
Neutro Abs: 5.3 10*3/uL (ref 1.4–7.7)
Neutrophils Relative %: 54.8 % (ref 43.0–77.0)
PLATELETS: 529 10*3/uL — AB (ref 150.0–400.0)
RBC: 4.76 Mil/uL (ref 3.87–5.11)
RDW: 14.4 % (ref 11.5–15.5)
WBC: 9.6 10*3/uL (ref 4.0–10.5)

## 2018-08-25 LAB — HEPATIC FUNCTION PANEL
ALT: 10 U/L (ref 0–35)
AST: 11 U/L (ref 0–37)
Albumin: 4.1 g/dL (ref 3.5–5.2)
Alkaline Phosphatase: 90 U/L (ref 39–117)
Bilirubin, Direct: 0.1 mg/dL (ref 0.0–0.3)
Total Bilirubin: 0.5 mg/dL (ref 0.2–1.2)
Total Protein: 7.8 g/dL (ref 6.0–8.3)

## 2018-08-25 LAB — LIPID PANEL
CHOL/HDL RATIO: 4
Cholesterol: 184 mg/dL (ref 0–200)
HDL: 52.3 mg/dL (ref >39.00)
LDL Cholesterol: 108 mg/dL — ABNORMAL HIGH (ref 0–99)
NonHDL: 131.32
Triglycerides: 118 mg/dL (ref 0.0–149.0)
VLDL: 23.6 mg/dL (ref 0.0–40.0)

## 2018-08-25 LAB — TSH: TSH: 2.48 u[IU]/mL (ref 0.35–4.50)

## 2018-08-25 LAB — HEMOGLOBIN A1C: Hgb A1c MFr Bld: 6.1 % (ref 4.6–6.5)

## 2018-08-29 ENCOUNTER — Ambulatory Visit (INDEPENDENT_AMBULATORY_CARE_PROVIDER_SITE_OTHER): Payer: Medicare Other | Admitting: Internal Medicine

## 2018-08-29 ENCOUNTER — Encounter: Payer: Self-pay | Admitting: Internal Medicine

## 2018-08-29 DIAGNOSIS — I5030 Unspecified diastolic (congestive) heart failure: Secondary | ICD-10-CM

## 2018-08-29 DIAGNOSIS — R059 Cough, unspecified: Secondary | ICD-10-CM

## 2018-08-29 DIAGNOSIS — E559 Vitamin D deficiency, unspecified: Secondary | ICD-10-CM

## 2018-08-29 DIAGNOSIS — E7800 Pure hypercholesterolemia, unspecified: Secondary | ICD-10-CM

## 2018-08-29 DIAGNOSIS — R739 Hyperglycemia, unspecified: Secondary | ICD-10-CM

## 2018-08-29 DIAGNOSIS — G4730 Sleep apnea, unspecified: Secondary | ICD-10-CM

## 2018-08-29 DIAGNOSIS — R519 Headache, unspecified: Secondary | ICD-10-CM

## 2018-08-29 DIAGNOSIS — D473 Essential (hemorrhagic) thrombocythemia: Secondary | ICD-10-CM

## 2018-08-29 DIAGNOSIS — K219 Gastro-esophageal reflux disease without esophagitis: Secondary | ICD-10-CM

## 2018-08-29 DIAGNOSIS — R51 Headache: Secondary | ICD-10-CM | POA: Diagnosis not present

## 2018-08-29 DIAGNOSIS — D75839 Thrombocytosis, unspecified: Secondary | ICD-10-CM

## 2018-08-29 DIAGNOSIS — R05 Cough: Secondary | ICD-10-CM

## 2018-08-29 DIAGNOSIS — I10 Essential (primary) hypertension: Secondary | ICD-10-CM

## 2018-08-29 MED ORDER — AZELASTINE HCL 0.1 % NA SOLN: 1.0000 | Freq: Two times a day (BID) | NASAL | 1 refills | Status: DC

## 2018-08-29 NOTE — Progress Notes (Addendum)
Patient ID: Nicole Sims, female   DOB: 08/03/1951, 68 y.o.   MRN: [id number]   Subjective:    Patient ID: Nicole Sims, female    DOB: March 13, 1951, 68 y.o.   MRN: [id number]  HPI  Patient here for a scheduled follow up.  She was evaluated 08/18/18 and diagnosed with flu.  Treated.  States she is having some right sinus pressure and ears are stopped up.  Increased nasal congestion and drainage.  No sore throat.  Some coughing spells.  No sob.  Has been taking tussin DM,singulair and afrin.  Is feeling better.  No chest pain.  Taking linzess and citracel.  Due to f/u with GI this week.  Taking carafate.  No urine change.     Past Medical History:  Diagnosis Date  . Anemia   . Anxiety   . Asthma   . Cataract cortical, senile   . Chronic headaches   . Diverticulosis   . Environmental allergies   . GERD (gastroesophageal reflux disease)   . Heart murmur   . Heart murmur   . Hypercholesterolemia   . Hyperglycemia   . Hyperlipidemia   . Hypertension   . Obesity   . Osteoarthritis   . Palpitations   . Sleep apnea   . Thrombocytosis ([place])   . Urinary incontinence    mixed  . Urinary incontinence   . Venous insufficiency   . Vitamin D deficiency    Past Surgical History:  Procedure Laterality Date  . BLADDER SURGERY     x2   [facility]  . BREAST CYST ASPIRATION Bilateral 2005   approximate year  . CARDIAC CATHETERIZATION     [facility]  . CERVICAL CONE BIOPSY     [facility]  . CHOLECYSTECTOMY    . COLONOSCOPY WITH PROPOFOL N/A 07/19/2016   Procedure: COLONOSCOPY WITH PROPOFOL;  Surgeon: Manya Silvas, MD;  Location: [facility] ENDOSCOPY;  Service: Endoscopy;  Laterality: N/A;  . ESOPHAGOGASTRODUODENOSCOPY (EGD) WITH PROPOFOL N/A 07/19/2016   Procedure: ESOPHAGOGASTRODUODENOSCOPY (EGD) WITH PROPOFOL;  Surgeon: Manya Silvas, MD;  Location: [facility] ENDOSCOPY;  Service: Endoscopy;  Laterality: N/A;  . KNEE ARTHROSCOPY  08/13/08  . knee replacement and revision     left  .  RECTOCELE REPAIR    . RIGHT/LEFT HEART CATH AND CORONARY ANGIOGRAPHY Bilateral 04/10/2018   Procedure: RIGHT/LEFT HEART CATH AND CORONARY ANGIOGRAPHY;  Surgeon: Wellington Hampshire, MD;  Location: [facility];  Service: Cardiovascular;  Laterality: Bilateral;  . ROTATOR CUFF REPAIR     bilateral  . SHOULDER SURGERY  11/17/05  . TONSILLECTOMY  1962  . VAGINAL HYSTERECTOMY  1974   abnormal pap and carcinoma in situ   Family History  Problem Relation Age of Onset  . Diabetes Mellitus II Father   . Thyroid disease Father   . Breast cancer Maternal Aunt   . Heart Problems Brother   . Colon cancer Neg Hx   . Kidney cancer Neg Hx   . Bladder Cancer Neg Hx    Social History   Socioeconomic History  . Marital status: Married    Spouse name: Not on file  . Number of children: Not on file  . Years of education: Not on file  . Highest education level: Not on file  Occupational History  . Not on file  Social Needs  . Financial resource strain: Not on file  . Food insecurity:    Worry: Not on file    Inability: Not on  file  . Transportation needs:    Medical: Not on file    Non-medical: Not on file  Tobacco Use  . Smoking status: Former Smoker    Last attempt to quit: 08/17/1983    Years since quitting: 35.0  . Smokeless tobacco: Never Used  Substance and Sexual Activity  . Alcohol use: Yes    Alcohol/week: 0.0 standard drinks    Comment: rarely  . Drug use: No  . Sexual activity: Not on file  Lifestyle  . Physical activity:    Days per week: Not on file    Minutes per session: Not on file  . Stress: Not on file  Relationships  . Social connections:    Talks on phone: Not on file    Gets together: Not on file    Attends religious service: Not on file    Active member of club or organization: Not on file    Attends meetings of clubs or organizations: Not on file    Relationship status: Not on file  Other Topics Concern  . Not on file  Social History Narrative  .  Not on file    Outpatient Encounter Medications as of 08/29/2018  Medication Sig  . albuterol (PROVENTIL HFA;VENTOLIN HFA) 108 (90 BASE) MCG/ACT inhaler Inhale 2 puffs into the lungs every 4 (four) hours as needed. (Patient taking differently: Inhale 2 puffs into the lungs every 4 (four) hours as needed for wheezing or shortness of breath. )  . albuterol (PROVENTIL) (2.5 MG/3ML) 0.083% nebulizer solution Take 3 mLs (2.5 mg total) by nebulization every 6 (six) hours as needed for wheezing or shortness of breath.  Marland Kitchen aspirin EC 81 MG tablet Take 81 mg by mouth daily.  Marland Kitchen azelastine (ASTELIN) 0.1 % nasal spray Place 1 spray into both nostrils 2 (two) times daily. Use in each nostril as directed  . budesonide-formoterol (SYMBICORT) 80-4.5 MCG/ACT inhaler Inhale 2 puffs into the lungs 2 (two) times daily. (Patient taking differently: Inhale 2 puffs into the lungs 2 (two) times daily as needed (short of breath). )  . Calcium Citrate-Vitamin D (CALCIUM CITRATE + D PO) Take 1 tablet by mouth daily. 500/400  . Cholecalciferol (VITAMIN D-3) 1000 units CAPS Take 1 capsule by mouth daily.   . clobetasol ointment (TEMOVATE) 3.71 % Apply 1 application topically 2 (two) times daily.   . magnesium oxide (MAG-OX) 400 MG tablet Take 400 mg daily by mouth.  . metoprolol succinate (TOPROL-XL) 50 MG 24 hr tablet TAKE 1 TABLET BY MOUTH  EVERY DAY OR IMMEDIATELY  FOLLOWING A MEAL  . montelukast (SINGULAIR) 10 MG tablet TAKE 1 TABLET BY MOUTH AT  BEDTIME (Patient taking differently: Take 10 mg by mouth at bedtime. )  . Multiple Vitamin (MULTIVITAMIN) tablet Take 1 tablet by mouth daily.  . naproxen sodium (ALEVE) 220 MG tablet Take 220-440 mg by mouth daily as needed (pain).  . NONFORMULARY OR COMPOUNDED ITEM Place 1 spray into the nose 2 (two) times daily. Budesonide + Saline Irrigation/Rinse  . ondansetron (ZOFRAN) 4 MG tablet Take 1 tablet (4 mg total) by mouth every 8 (eight) hours as needed for nausea or vomiting.  .  RABEprazole (ACIPHEX) 20 MG tablet Take 20 mg by mouth 2 (two) times daily.  Marland Kitchen rOPINIRole (REQUIP) 2 MG tablet Take 2.5 mg by mouth daily.   . rosuvastatin (CRESTOR) 10 MG tablet TAKE 1 TABLET BY MOUTH  DAILY (Patient taking differently: Take 10 mg by mouth every Monday, Wednesday, and Friday. )  .  venlafaxine XR (EFFEXOR-XR) 150 MG 24 hr capsule TAKE 1 CAPSULE BY MOUTH  EVERY DAY WITH BREAKFAST (Patient taking differently: Take 150 mg by mouth daily with breakfast. TAKE 1 CAPSULE BY MOUTH EVERY DAY WITH BREAKFAST)  . vitamin B-12 (CYANOCOBALAMIN) 1000 MCG tablet Take 1,000 mcg by mouth daily.  . [DISCONTINUED] azelastine (ASTELIN) 0.1 % nasal spray Place 1 spray into both nostrils 2 (two) times daily. Use in each nostril as directed  . [DISCONTINUED] azithromycin (ZITHROMAX) 250 MG tablet 2 pills days 1, 1 pill day 1-5  . [DISCONTINUED] metoprolol succinate (TOPROL-XL) 50 MG 24 hr tablet Take 50 mg by mouth daily. Take with or immediately following a meal.   . [DISCONTINUED] oseltamivir (TAMIFLU) 75 MG capsule Take 1 capsule (75 mg total) by mouth 2 (two) times daily.   No facility-administered encounter medications on file as of 08/29/2018.     Review of Systems  Constitutional: Negative for appetite change and unexpected weight change.  HENT: Positive for congestion and postnasal drip. Negative for sore throat.   Respiratory: Negative for chest tightness and shortness of breath.        Minimal cough.    Cardiovascular: Negative for chest pain, palpitations and leg swelling.  Gastrointestinal: Negative for abdominal pain, nausea and vomiting.  Genitourinary: Negative for difficulty urinating and dysuria.  Musculoskeletal: Negative for joint swelling and myalgias.  Skin: Negative for color change and rash.  Neurological: Negative for dizziness, light-headedness and headaches.  Psychiatric/Behavioral: Negative for agitation and dysphoric mood.       Objective:    Physical  Exam Constitutional:      General: She is not in acute distress.    Appearance: Normal appearance.  HENT:     Nose: Nose normal. No congestion.     Mouth/Throat:     Pharynx: No oropharyngeal exudate or posterior oropharyngeal erythema.  Neck:     Musculoskeletal: Neck supple. No muscular tenderness.     Thyroid: No thyromegaly.  Cardiovascular:     Rate and Rhythm: Normal rate and regular rhythm.  Pulmonary:     Effort: No respiratory distress.     Breath sounds: Normal breath sounds. No wheezing.  Abdominal:     General: Bowel sounds are normal.     Palpations: Abdomen is soft.     Tenderness: There is no abdominal tenderness.  Musculoskeletal:        General: No swelling or tenderness.  Lymphadenopathy:     Cervical: No cervical adenopathy.  Skin:    Findings: No erythema or rash.  Neurological:     Mental Status: She is alert.  Psychiatric:        Mood and Affect: Mood normal.        Behavior: Behavior normal.     BP 136/78 (BP Location: Left Arm, Patient Position: Sitting, Cuff Size: Normal)   Pulse 68   Temp 97.8 F (36.6 C) (Oral)   Resp 16   Wt 229 lb 3.2 oz (104 kg)   SpO2 98%   BMI 38.14 kg/m  Wt Readings from Last 3 Encounters:  09/01/18 225 lb (102.1 kg)  08/29/18 229 lb 3.2 oz (104 kg)  08/18/18 230 lb 9.6 oz (104.6 kg)     Lab Results  Component Value Date   WBC 9.6 08/25/2018   HGB 13.3 08/25/2018   HCT 40.5 08/25/2018   PLT 529.0 (H) 08/25/2018   GLUCOSE 100 (H) 08/25/2018   CHOL 184 08/25/2018   TRIG 118.0 08/25/2018   HDL  52.30 08/25/2018   LDLDIRECT 172.8 09/17/2013   LDLCALC 108 (H) 08/25/2018   ALT 10 08/25/2018   AST 11 08/25/2018   NA 141 08/25/2018   K 4.0 08/25/2018   CL 105 08/25/2018   CREATININE 0.99 08/25/2018   BUN 16 08/25/2018   CO2 27 08/25/2018   TSH 2.48 08/25/2018   INR 0.9 07/02/2013   HGBA1C 6.1 08/25/2018   MICROALBUR 1.1 04/28/2015    Ct Abdomen Pelvis Wo Contrast  Result Date: 05/18/2018 CLINICAL  DATA:  Unintended weight loss, 20 pounds in 6 weeks. Nausea, vomiting, and upper abdominal pain. EXAM: CT ABDOMEN AND PELVIS WITHOUT CONTRAST TECHNIQUE: Multidetector CT imaging of the abdomen and pelvis was performed following the standard protocol without IV contrast. COMPARISON:  10/23/2015 FINDINGS: Lower chest: The visualized lung bases are clear. Hepatobiliary: No focal liver abnormality is seen. Status post cholecystectomy. No biliary dilatation. Pancreas: Unremarkable. Spleen: Unremarkable. Adrenals/Urinary Tract: Unremarkable adrenal glands. Asymmetrically small left kidney with multiple areas of scarring, unchanged. Punctate cortical calcification in the upper pole of the left kidney. No right renal calculi. No hydronephrosis. Unremarkable bladder. Stomach/Bowel: The stomach is within normal limits. There is no evidence of bowel obstruction. A moderate amount of stool is present in the colon. The appendix is not clearly identified, however no inflammatory changes are seen about the cecum. Vascular/Lymphatic: Abdominal aortic atherosclerosis without aneurysm. Reproductive: Status post hysterectomy. No adnexal masses. Other: No intraperitoneal free fluid. Musculoskeletal: No acute osseous abnormality or suspicious osseous lesion. Moderate lumbar disc and facet degeneration. IMPRESSION: 1. No acute abnormality or mass identified in the abdomen or pelvis. 2. Unchanged chronic asymmetric left renal atrophy. 3. Aortic Atherosclerosis (ICD10-I70.0). Electronically Signed   By: Logan Bores M.D.   On: 05/18/2018 11:46       Assessment & Plan:   Problem List Items Addressed This Visit    Chronic headaches    Followed by neurology.        Cough    With some congestion and minimal cough.  Better.  Continue singulair, robitussin DM and saline nasal spray and steroid nasal spray as outlined.  Follow.        Diastolic heart failure (HCC)    Mild.  Has seen Dr Fletcher Anon.  Symptoms stable.        GERD  (gastroesophageal reflux disease)    Followed by GI.        Hypercholesterolemia    On crestor.  Low cholesterol diet and exercise.  Follow lipid panel and liver function tests.        Relevant Orders   Hepatic function panel   Lipid panel   Hyperglycemia    Low carb diet and exercise.  Follow met b and a1c.       Relevant Orders   Hemoglobin A1c   Hypertension    Blood pressure under good control.  Continue same medication regimen.  Follow pressures.  Follow metabolic panel.        Relevant Orders   Basic metabolic panel   Sleep apnea    Seeing pulmonary.  CPAP.       Thrombocytosis (HCC)    Persistent elevated platelet count.  Will have hematology reevaluate.        Relevant Orders   Ambulatory referral to Hematology   Vitamin D deficiency    Follow vitamin D level.            Einar Pheasant, MD

## 2018-08-31 ENCOUNTER — Encounter: Payer: Self-pay | Admitting: Student

## 2018-09-01 ENCOUNTER — Encounter: Payer: Self-pay | Admitting: Anesthesiology

## 2018-09-01 ENCOUNTER — Encounter
Admission: RE | Disposition: A | Discharge status: Home / Self Care | Payer: Self-pay | Source: Home / Self Care | Attending: Gastroenterology

## 2018-09-01 ENCOUNTER — Encounter: Payer: Self-pay | Admitting: *Deleted

## 2018-09-01 ENCOUNTER — Ambulatory Visit
Admission: RE | Admit: 2018-09-01 | Discharge: 2018-09-01 | Disposition: A | Discharge status: Home / Self Care | Payer: Medicare Other | Attending: Gastroenterology | Admitting: Gastroenterology

## 2018-09-01 DIAGNOSIS — Z1211 Encounter for screening for malignant neoplasm of colon: Secondary | ICD-10-CM | POA: Diagnosis not present

## 2018-09-01 DIAGNOSIS — Z538 Procedure and treatment not carried out for other reasons: Secondary | ICD-10-CM | POA: Diagnosis not present

## 2018-09-01 HISTORY — DX: Obesity, unspecified: E66.9

## 2018-09-01 HISTORY — DX: Sleep apnea, unspecified: G47.30

## 2018-09-01 HISTORY — DX: Hyperlipidemia, unspecified: E78.5

## 2018-09-01 HISTORY — DX: Vitamin D deficiency, unspecified: E55.9

## 2018-09-01 HISTORY — DX: Cortical age-related cataract, unspecified eye: H25.019

## 2018-09-01 HISTORY — DX: Venous insufficiency (chronic) (peripheral): I87.2

## 2018-09-01 SURGERY — COLONOSCOPY WITH PROPOFOL
Anesthesia: General

## 2018-09-01 MED ORDER — PROPOFOL 10 MG/ML IV BOLUS
INTRAVENOUS | Status: AC
  Filled 2018-09-01: qty 80

## 2018-09-01 NOTE — H&P (Signed)
Patient presented today for colonoscopy.  She expressed that she felt her prep may not be very good.  A rectal exam was done with that verified the prep was likely not very good.  As such we decided to schedule and reprepped.  Did not receive IV.

## 2018-09-03 ENCOUNTER — Encounter: Payer: Self-pay | Admitting: Internal Medicine

## 2018-09-03 NOTE — Assessment & Plan Note (Signed)
Mild.  Has seen Dr Fletcher Anon.  Symptoms stable.

## 2018-09-03 NOTE — Assessment & Plan Note (Signed)
Followed by GI

## 2018-09-03 NOTE — Assessment & Plan Note (Signed)
Seeing pulmonary.  CPAP.  

## 2018-09-03 NOTE — Assessment & Plan Note (Signed)
Persistent elevated platelet count.  Will have hematology reevaluate.

## 2018-09-03 NOTE — Addendum Note (Signed)
Addended by: Alisa Graff on: 09/03/2018 08:40 AM   Modules accepted: Orders

## 2018-09-03 NOTE — Assessment & Plan Note (Signed)
With some congestion and minimal cough.  Better.  Continue singulair, robitussin DM and saline nasal spray and steroid nasal spray as outlined.  Follow.

## 2018-09-03 NOTE — Assessment & Plan Note (Signed)
On crestor.  Low cholesterol diet and exercise.  Follow lipid panel and liver function tests.   

## 2018-09-03 NOTE — Assessment & Plan Note (Signed)
Blood pressure under good control.  Continue same medication regimen.  Follow pressures.  Follow metabolic panel.   

## 2018-09-03 NOTE — Assessment & Plan Note (Signed)
Follow vitamin D level.  

## 2018-09-03 NOTE — Assessment & Plan Note (Signed)
Low carb diet and exercise.  Follow met b and a1c.  

## 2018-09-03 NOTE — Assessment & Plan Note (Signed)
Followed by neurology.   

## 2018-09-12 ENCOUNTER — Other Ambulatory Visit: Payer: Self-pay

## 2018-09-12 ENCOUNTER — Inpatient Hospital Stay: Payer: Medicare Other | Attending: Oncology | Admitting: Oncology

## 2018-09-12 ENCOUNTER — Inpatient Hospital Stay: Payer: Medicare Other

## 2018-09-12 ENCOUNTER — Encounter: Payer: Self-pay | Admitting: Oncology

## 2018-09-12 VITALS — BP 152/82 | HR 80 | Temp 97.2°F | Resp 18 | Ht 64.5 in | Wt 227.0 lb

## 2018-09-12 DIAGNOSIS — Z8249 Family history of ischemic heart disease and other diseases of the circulatory system: Secondary | ICD-10-CM | POA: Insufficient documentation

## 2018-09-12 DIAGNOSIS — D473 Essential (hemorrhagic) thrombocythemia: Secondary | ICD-10-CM

## 2018-09-12 DIAGNOSIS — R0982 Postnasal drip: Secondary | ICD-10-CM | POA: Diagnosis not present

## 2018-09-12 DIAGNOSIS — Z7982 Long term (current) use of aspirin: Secondary | ICD-10-CM | POA: Diagnosis not present

## 2018-09-12 DIAGNOSIS — Z87891 Personal history of nicotine dependence: Secondary | ICD-10-CM

## 2018-09-12 DIAGNOSIS — Z79899 Other long term (current) drug therapy: Secondary | ICD-10-CM | POA: Diagnosis not present

## 2018-09-12 DIAGNOSIS — D75839 Thrombocytosis, unspecified: Secondary | ICD-10-CM

## 2018-09-12 DIAGNOSIS — R110 Nausea: Secondary | ICD-10-CM | POA: Diagnosis not present

## 2018-09-12 DIAGNOSIS — R7989 Other specified abnormal findings of blood chemistry: Secondary | ICD-10-CM | POA: Insufficient documentation

## 2018-09-12 LAB — CBC WITH DIFFERENTIAL/PLATELET
Abs Immature Granulocytes: 0.02 10*3/uL (ref 0.00–0.07)
BASOS PCT: 1 %
Basophils Absolute: 0.1 10*3/uL (ref 0.0–0.1)
EOS ABS: 0.2 10*3/uL (ref 0.0–0.5)
Eosinophils Relative: 2 %
HCT: 41.5 % (ref 36.0–46.0)
Hemoglobin: 12.9 g/dL (ref 12.0–15.0)
Immature Granulocytes: 0 %
LYMPHS ABS: 3.5 10*3/uL (ref 0.7–4.0)
Lymphocytes Relative: 39 %
MCH: 27 pg (ref 26.0–34.0)
MCHC: 31.1 g/dL (ref 30.0–36.0)
MCV: 86.8 fL (ref 80.0–100.0)
Monocytes Absolute: 0.6 10*3/uL (ref 0.1–1.0)
Monocytes Relative: 7 %
Neutro Abs: 4.5 10*3/uL (ref 1.7–7.7)
Neutrophils Relative %: 51 %
PLATELETS: 477 10*3/uL — AB (ref 150–400)
RBC: 4.78 MIL/uL (ref 3.87–5.11)
RDW: 13.5 % (ref 11.5–15.5)
WBC: 8.8 10*3/uL (ref 4.0–10.5)
nRBC: 0 % (ref 0.0–0.2)

## 2018-09-12 LAB — IRON AND TIBC
Iron: 64 ug/dL (ref 28–170)
Saturation Ratios: 16 % (ref 10.4–31.8)
TIBC: 413 ug/dL (ref 250–450)
UIBC: 349 ug/dL

## 2018-09-12 LAB — FERRITIN: Ferritin: 30 ng/mL (ref 11–307)

## 2018-09-12 LAB — TECHNOLOGIST SMEAR REVIEW: Tech Review: NORMAL

## 2018-09-12 NOTE — Progress Notes (Signed)
Hematology/Oncology Consult note [facility] Telephone:([phone] [phone]   Patient Care Team: Einar Pheasant, MD as PCP - General (Internal Medicine)  REFERRING PROVIDER: Einar Pheasant, MD CHIEF COMPLAINTS/REASON FOR VISIT:  Evaluation of thrombocytosis  HISTORY OF PRESENTING ILLNESS:  Nicole Sims is a  68 y.o.  female with PMH listed below who was referred to me for evaluation of thromcbocytosis  Reviewed patient's labs which were obtained by PCP.  09/12/2018 labs showed elevated platelet counts at 529,000, Wbc 9.6 and hemoglobin 13.3  Reviewed patient's previous labs. Thrombocytosis onset is chronic onset , duration since at least 2014. No aggravating or elevated factors. Associated symptoms or signs:  Denies weight loss, fever, chills, fatigue, night sweats.  Context:  Smoking history: Denies Family history of polycythemia.  Denies History of iron deficiency anemia denies History of DVT denies She feels nauseated.  Scheduled for endoscopy in March 2020.  Patient had episode of sneezing, sore throat upper respiratory symptoms and a fever of 100.3, postnasal drip. Influenza a positive.  Strep negative.  Patient was treated with Z-Pak, Zofran.     Review of Systems  Constitutional: Negative for appetite change, chills, fatigue and fever.  HENT:   Negative for hearing loss and voice change.        Postnasal drip  Eyes: Negative for eye problems.  Respiratory: Negative for chest tightness and cough.   Cardiovascular: Negative for chest pain.  Gastrointestinal: Negative for abdominal distention, abdominal pain and blood in stool.  Endocrine: Negative for hot flashes.  Genitourinary: Negative for difficulty urinating and frequency.   Musculoskeletal: Negative for arthralgias.  Skin: Negative for itching and rash.  Neurological: Negative for extremity weakness.  Hematological: Negative for adenopathy.  Psychiatric/Behavioral:  Negative for confusion.    MEDICAL HISTORY:  Past Medical History:  Diagnosis Date  . Anemia   . Anxiety   . Asthma   . Cataract cortical, senile   . Chronic headaches   . Diastolic heart failure ([place])   . Diverticulosis   . Environmental allergies   . GERD (gastroesophageal reflux disease)   . Heart murmur   . Heart murmur   . Hypercholesterolemia   . Hyperglycemia   . Hyperlipidemia   . Hypertension   . Obesity   . Osteoarthritis   . Palpitations   . Restless leg syndrome   . Sleep apnea   . Thrombocytosis ([place])   . Urinary incontinence    mixed  . Urinary incontinence   . Venous insufficiency   . Vitamin D deficiency     SURGICAL HISTORY: Past Surgical History:  Procedure Laterality Date  . BLADDER SURGERY     x2   [facility]  . BREAST CYST ASPIRATION Bilateral 2005   approximate year  . CARDIAC CATHETERIZATION     [facility] Massed  . CERVICAL CONE BIOPSY     [facility]  . CHOLECYSTECTOMY    . COLONOSCOPY WITH PROPOFOL N/A 07/19/2016   Procedure: COLONOSCOPY WITH PROPOFOL;  Surgeon: Manya Silvas, MD;  Location: [facility] ENDOSCOPY;  Service: Endoscopy;  Laterality: N/A;  . ESOPHAGOGASTRODUODENOSCOPY (EGD) WITH PROPOFOL N/A 07/19/2016   Procedure: ESOPHAGOGASTRODUODENOSCOPY (EGD) WITH PROPOFOL;  Surgeon: Manya Silvas, MD;  Location: [facility] ENDOSCOPY;  Service: Endoscopy;  Laterality: N/A;  . KNEE ARTHROSCOPY  08/13/08  . knee replacement and revision     left  . RECTOCELE REPAIR    . RIGHT/LEFT HEART CATH AND CORONARY ANGIOGRAPHY Bilateral 04/10/2018   Procedure: RIGHT/LEFT HEART CATH AND CORONARY  ANGIOGRAPHY;  Surgeon: Wellington Hampshire, MD;  Location: [facility];  Service: Cardiovascular;  Laterality: Bilateral;  . ROTATOR CUFF REPAIR     bilateral  . SHOULDER SURGERY  11/17/05  . TONSILLECTOMY  1962  . VAGINAL HYSTERECTOMY  1974   abnormal pap and carcinoma in situ    SOCIAL HISTORY: Social History   Socioeconomic History  . Marital status:  Married    Spouse name: Not on file  . Number of children: Not on file  . Years of education: Not on file  . Highest education level: Not on file  Occupational History  . Occupation: retired  Scientific laboratory technician  . Financial resource strain: Not on file  . Food insecurity:    Worry: Not on file    Inability: Not on file  . Transportation needs:    Medical: Not on file    Non-medical: Not on file  Tobacco Use  . Smoking status: Former Smoker    Last attempt to quit: 08/17/1983    Years since quitting: 35.0  . Smokeless tobacco: Never Used  Substance and Sexual Activity  . Alcohol use: Yes    Alcohol/week: 0.0 standard drinks    Comment: rarely  . Drug use: No  . Sexual activity: Not on file  Lifestyle  . Physical activity:    Days per week: Not on file    Minutes per session: Not on file  . Stress: Not on file  Relationships  . Social connections:    Talks on phone: Not on file    Gets together: Not on file    Attends religious service: Not on file    Active member of club or organization: Not on file    Attends meetings of clubs or organizations: Not on file    Relationship status: Not on file  . Intimate partner violence:    Fear of current or ex partner: Not on file    Emotionally abused: Not on file    Physically abused: Not on file    Forced sexual activity: Not on file  Other Topics Concern  . Not on file  Social History Narrative  . Not on file    FAMILY HISTORY: Family History  Problem Relation Age of Onset  . Diabetes Mellitus II Father   . Thyroid disease Father   . Breast cancer Maternal Aunt   . Hypertension Mother   . Heart Problems Brother   . Colon cancer Neg Hx   . Kidney cancer Neg Hx   . Bladder Cancer Neg Hx     ALLERGIES:  is allergic to augmentin [amoxicillin-pot clavulanate]; bactrim [sulfamethoxazole-trimethoprim]; doxycycline; hydrocodone-acetaminophen; levaquin [levofloxacin]; pseudoephedrine; relafen [nabumetone]; and shellfish  allergy.  MEDICATIONS:  Current Outpatient Medications  Medication Sig Dispense Refill  . albuterol (PROVENTIL HFA;VENTOLIN HFA) 108 (90 BASE) MCG/ACT inhaler Inhale 2 puffs into the lungs every 4 (four) hours as needed. (Patient taking differently: Inhale 2 puffs into the lungs every 4 (four) hours as needed for wheezing or shortness of breath. ) 1 Inhaler 3  . albuterol (PROVENTIL) (2.5 MG/3ML) 0.083% nebulizer solution Take 3 mLs (2.5 mg total) by nebulization every 6 (six) hours as needed for wheezing or shortness of breath. 150 mL 1  . aspirin EC 81 MG tablet Take 81 mg by mouth daily.    Marland Kitchen azelastine (ASTELIN) 0.1 % nasal spray Place 1 spray into both nostrils 2 (two) times daily. Use in each nostril as directed 90 mL 1  . budesonide-formoterol (  SYMBICORT) 80-4.5 MCG/ACT inhaler Inhale 2 puffs into the lungs 2 (two) times daily. (Patient taking differently: Inhale 2 puffs into the lungs 2 (two) times daily as needed (short of breath). ) 1 Inhaler 3  . Calcium Citrate-Vitamin D (CALCIUM CITRATE + D PO) Take 1 tablet by mouth daily. 500/400    . Cholecalciferol (VITAMIN D-3) 1000 units CAPS Take 1 capsule by mouth daily.     . clobetasol ointment (TEMOVATE) 6.29 % Apply 1 application topically 2 (two) times daily.   0  . linaclotide (LINZESS) 290 MCG CAPS capsule Take 290 mcg by mouth daily before breakfast.    . magnesium oxide (MAG-OX) 400 MG tablet Take 400 mg daily by mouth.    . metoprolol succinate (TOPROL-XL) 50 MG 24 hr tablet TAKE 1 TABLET BY MOUTH  EVERY DAY OR IMMEDIATELY  FOLLOWING A MEAL 90 tablet 1  . montelukast (SINGULAIR) 10 MG tablet TAKE 1 TABLET BY MOUTH AT  BEDTIME (Patient taking differently: Take 10 mg by mouth at bedtime. ) 90 tablet 1  . Multiple Vitamin (MULTIVITAMIN) tablet Take 1 tablet by mouth daily.    . naproxen sodium (ALEVE) 220 MG tablet Take 220-440 mg by mouth daily as needed (pain).    . NONFORMULARY OR COMPOUNDED ITEM Place 1 spray into the nose 2 (two)  times daily. Budesonide + Saline Irrigation/Rinse    . ondansetron (ZOFRAN) 4 MG tablet Take 1 tablet (4 mg total) by mouth every 8 (eight) hours as needed for nausea or vomiting. 30 tablet 1  . RABEprazole (ACIPHEX) 20 MG tablet Take 20 mg by mouth 2 (two) times daily.    Marland Kitchen rOPINIRole (REQUIP) 2 MG tablet Take 2.5 mg by mouth daily.   1  . rosuvastatin (CRESTOR) 10 MG tablet TAKE 1 TABLET BY MOUTH  DAILY (Patient taking differently: Take 10 mg by mouth every Monday, Wednesday, and Friday. ) 90 tablet 1  . venlafaxine XR (EFFEXOR-XR) 150 MG 24 hr capsule TAKE 1 CAPSULE BY MOUTH  EVERY DAY WITH BREAKFAST (Patient taking differently: Take 150 mg by mouth daily with breakfast. TAKE 1 CAPSULE BY MOUTH EVERY DAY WITH BREAKFAST) 90 capsule 1  . vitamin B-12 (CYANOCOBALAMIN) 1000 MCG tablet Take 1,000 mcg by mouth daily.     No current facility-administered medications for this visit.      PHYSICAL EXAMINATION: ECOG PERFORMANCE STATUS: 1 - Symptomatic but completely ambulatory Vitals:   09/12/18 1455  BP: (!) 152/82  Pulse: 80  Resp: 18  Temp: (!) 97.2 F (36.2 C)   Filed Weights   09/12/18 1455  Weight: 227 lb (103 kg)    Physical Exam Constitutional:      General: She is not in acute distress. HENT:     Head: Normocephalic and atraumatic.  Eyes:     General: No scleral icterus.    Pupils: Pupils are equal, round, and reactive to light.  Neck:     Musculoskeletal: Normal range of motion and neck supple.  Cardiovascular:     Rate and Rhythm: Normal rate and regular rhythm.     Heart sounds: Normal heart sounds.  Pulmonary:     Effort: Pulmonary effort is normal. No respiratory distress.     Breath sounds: No wheezing.  Abdominal:     General: Bowel sounds are normal. There is no distension.     Palpations: Abdomen is soft. There is no mass.     Tenderness: There is no abdominal tenderness.  Musculoskeletal: Normal range of motion.  General: No deformity.  Skin:     General: Skin is warm and dry.     Findings: No erythema or rash.  Neurological:     Mental Status: She is alert and oriented to person, place, and time.     Cranial Nerves: No cranial nerve deficit.     Coordination: Coordination normal.  Psychiatric:        Behavior: Behavior normal.        Thought Content: Thought content normal.      LABORATORY DATA:  I have reviewed the data as listed Lab Results  Component Value Date   WBC 8.8 09/12/2018   HGB 12.9 09/12/2018   HCT 41.5 09/12/2018   MCV 86.8 09/12/2018   PLT 477 (H) 09/12/2018   Recent Labs    04/04/18 1424 04/25/18 0810 05/18/18 0914 08/25/18 0817  NA 141 140 140 141  K 4.8 4.6 4.7 4.0  CL 103 106 106 105  CO2 23 28 28 27   GLUCOSE 90 103* 101* 100*  BUN 13 14 16 16   CREATININE 1.01* 1.08 1.02 0.99  CALCIUM 9.7 9.3 9.7 9.6  GFRNONAA 58*  --   --   --   GFRAA 67  --   --   --   PROT  --  7.5 7.5 7.8  ALBUMIN  --  3.9 4.1 4.1  AST  --  13 14 11   ALT  --  13 11 10   ALKPHOS  --  90 85 90  BILITOT  --  0.4 0.5 0.5  BILIDIR  --  0.1 0.1 0.1   Iron/TIBC/Ferritin/ %Sat    Component Value Date/Time   IRON 64 09/12/2018 1522   TIBC 413 09/12/2018 1522   FERRITIN 30 09/12/2018 1522   IRONPCTSAT 16 09/12/2018 1522     RADIOGRAPHIC STUDIES: I have personally reviewed the radiological images as listed and agreed with the findings in the report. 05/18/2018 CT abdomen pelvis showed no acute abnormality or mass identified in the abdomen or pelvis.  Unchanged chronic asymmetric left renal atrophy.  Aortic atherosclerosis.   ASSESSMENT & PLAN:  1. Thrombocytosis ([place])    I discussed with patient that the differential diagnosis of the thrombosis is broad, including benign etiology such as reactive to surgery, trauma, infection, nutrition deficiency, etc, as well as malignant etiology including underlying bone marrow disorders.  Her recent upper respiratory infection/influenza likely further increase the  thrombocytosis count. For the work up of patient's thrombocytosis, I recommend checking CBC;CMP, LDH, pathology smear review, iron,TIBC, ferritin and JAK 2 mutation, MPL; CALR mutation.   Iron panel reviewed.  TIBC 413, saturation ratio 16%, ferritin level 30.  Consistent with functional iron deficiency.  Orders Placed This Encounter  Procedures  . CBC with Differential/Platelet    Standing Status:   Future    Number of Occurrences:   1    Standing Expiration Date:   09/13/2019  . Technologist smear review    Standing Status:   Future    Number of Occurrences:   1    Standing Expiration Date:   09/13/2019  . JAK2 V617F, w Reflex to CALR/E12/MPL    Standing Status:   Future    Number of Occurrences:   1    Standing Expiration Date:   09/13/2019  . Iron and TIBC    Standing Status:   Future    Number of Occurrences:   1    Standing Expiration Date:   09/13/2019  . Ferritin  Standing Status:   Future    Number of Occurrences:   1    Standing Expiration Date:   09/13/2019    All questions were answered. The patient knows to call the clinic with any problems questions or concerns.  Return of visit: 2 weeks Thank you for this kind referral and the opportunity to participate in the care of this patient. A copy of today's note is routed to referring provider  Total face to face encounter time for this patient visit was 45 min. >50% of the time was  spent in counseling and coordination of care.    Earlie Server, MD, PhD Hematology Oncology [facility] Pager- [phone] 09/12/2018

## 2018-09-12 NOTE — Progress Notes (Signed)
Patient here for initial visit. Pt complains of daily nausea. She is scheduled for colonoscopy in march.

## 2018-09-13 ENCOUNTER — Encounter: Payer: Self-pay | Admitting: Oncology

## 2018-09-19 LAB — CALR + JAK2 E12-15 + MPL (REFLEXED)

## 2018-09-19 LAB — JAK2 V617F, W REFLEX TO CALR/E12/MPL

## 2018-10-05 ENCOUNTER — Other Ambulatory Visit: Payer: Self-pay

## 2018-10-05 ENCOUNTER — Encounter: Payer: Self-pay | Admitting: Oncology

## 2018-10-05 ENCOUNTER — Inpatient Hospital Stay: Payer: Medicare Other | Attending: Oncology | Admitting: Oncology

## 2018-10-05 VITALS — BP 161/80

## 2018-10-05 DIAGNOSIS — Z7982 Long term (current) use of aspirin: Secondary | ICD-10-CM | POA: Diagnosis not present

## 2018-10-05 DIAGNOSIS — Z87891 Personal history of nicotine dependence: Secondary | ICD-10-CM | POA: Diagnosis not present

## 2018-10-05 DIAGNOSIS — Z833 Family history of diabetes mellitus: Secondary | ICD-10-CM

## 2018-10-05 DIAGNOSIS — E785 Hyperlipidemia, unspecified: Secondary | ICD-10-CM | POA: Insufficient documentation

## 2018-10-05 DIAGNOSIS — Z8249 Family history of ischemic heart disease and other diseases of the circulatory system: Secondary | ICD-10-CM | POA: Diagnosis not present

## 2018-10-05 DIAGNOSIS — F419 Anxiety disorder, unspecified: Secondary | ICD-10-CM | POA: Diagnosis not present

## 2018-10-05 DIAGNOSIS — Z79899 Other long term (current) drug therapy: Secondary | ICD-10-CM | POA: Diagnosis not present

## 2018-10-05 DIAGNOSIS — D473 Essential (hemorrhagic) thrombocythemia: Secondary | ICD-10-CM | POA: Insufficient documentation

## 2018-10-05 DIAGNOSIS — K5900 Constipation, unspecified: Secondary | ICD-10-CM

## 2018-10-05 DIAGNOSIS — D75839 Thrombocytosis, unspecified: Secondary | ICD-10-CM

## 2018-10-06 NOTE — Progress Notes (Signed)
Hematology/Oncology Consult note [facility] Telephone:([phone] [phone]   Patient Care Team: Einar Pheasant, MD as PCP - General (Internal Medicine)  REFERRING PROVIDER: Einar Pheasant, MD CHIEF COMPLAINTS/REASON FOR VISIT:  Evaluation of thrombocytosis  HISTORY OF PRESENTING ILLNESS:  Nicole Sims is a  68 y.o.  female with PMH listed below who was referred to me for evaluation of thromcbocytosis  Reviewed patient's labs which were obtained by PCP.  09/12/2018 labs showed elevated platelet counts at 529,000, Wbc 9.6 and hemoglobin 13.3  Reviewed patient's previous labs. Thrombocytosis onset is chronic onset , duration since at least 2014. No aggravating or elevated factors. Associated symptoms or signs:  Denies weight loss, fever, chills, fatigue, night sweats.  Context:  Smoking history: Denies Family history of polycythemia.  Denies History of iron deficiency anemia denies History of DVT denies She feels nauseated.  Scheduled for endoscopy in March 2020.  Patient had episode of sneezing, sore throat upper respiratory symptoms and a fever of 100.3, postnasal drip. Influenza a positive.  Strep negative.  Patient was treated with Z-Pak, Zofran.   INTERVAL HISTORY Nicole Sims is a 68 y.o. female who has above history reviewed by me today presents for follow up visit for management of thrombocytosis.  Present to discuss lab results Patient has no new complaints.  Feels well at baseline.  Review of Systems  Constitutional: Negative for appetite change, chills, fatigue and fever.  HENT:   Negative for hearing loss and voice change.        Postnasal drip  Eyes: Negative for eye problems.  Respiratory: Negative for chest tightness and cough.   Cardiovascular: Negative for chest pain.  Gastrointestinal: Negative for abdominal distention, abdominal pain and blood in stool.  Endocrine: Negative for hot flashes.  Genitourinary: Negative  for difficulty urinating and frequency.   Musculoskeletal: Negative for arthralgias.  Skin: Negative for itching and rash.  Neurological: Negative for extremity weakness.  Hematological: Negative for adenopathy.  Psychiatric/Behavioral: Negative for confusion.    MEDICAL HISTORY:  Past Medical History:  Diagnosis Date  . Anemia   . Anxiety   . Asthma   . Cataract cortical, senile   . Chronic headaches   . Diastolic heart failure ([place])   . Diverticulosis   . Environmental allergies   . GERD (gastroesophageal reflux disease)   . Heart murmur   . Heart murmur   . Hypercholesterolemia   . Hyperglycemia   . Hyperlipidemia   . Hypertension   . Obesity   . Osteoarthritis   . Palpitations   . Restless leg syndrome   . Sleep apnea   . Thrombocytosis ([place])   . Urinary incontinence    mixed  . Urinary incontinence   . Venous insufficiency   . Vitamin D deficiency     SURGICAL HISTORY: Past Surgical History:  Procedure Laterality Date  . BLADDER SURGERY     x2   [facility]  . BREAST CYST ASPIRATION Bilateral 2005   approximate year  . CARDIAC CATHETERIZATION     Nehemiah Massed  . CERVICAL CONE BIOPSY     CIS  . CHOLECYSTECTOMY    . COLONOSCOPY WITH PROPOFOL N/A 07/19/2016   Procedure: COLONOSCOPY WITH PROPOFOL;  Surgeon: Manya Silvas, MD;  Location: [facility] ENDOSCOPY;  Service: Endoscopy;  Laterality: N/A;  . ESOPHAGOGASTRODUODENOSCOPY (EGD) WITH PROPOFOL N/A 07/19/2016   Procedure: ESOPHAGOGASTRODUODENOSCOPY (EGD) WITH PROPOFOL;  Surgeon: Manya Silvas, MD;  Location: [facility] ENDOSCOPY;  Service: Endoscopy;  Laterality:  N/A;  . KNEE ARTHROSCOPY  08/13/08  . knee replacement and revision     left  . RECTOCELE REPAIR    . RIGHT/LEFT HEART CATH AND CORONARY ANGIOGRAPHY Bilateral 04/10/2018   Procedure: RIGHT/LEFT HEART CATH AND CORONARY ANGIOGRAPHY;  Surgeon: Wellington Hampshire, MD;  Location: [place] CV LAB;  Service: Cardiovascular;  Laterality: Bilateral;  .  ROTATOR CUFF REPAIR     bilateral  . SHOULDER SURGERY  11/17/05  . TONSILLECTOMY  1962  . VAGINAL HYSTERECTOMY  1974   abnormal pap and carcinoma in situ    SOCIAL HISTORY: Social History   Socioeconomic History  . Marital status: Married    Spouse name: Not on file  . Number of children: Not on file  . Years of education: Not on file  . Highest education level: Not on file  Occupational History  . Occupation: retired  Scientific laboratory technician  . Financial resource strain: Not on file  . Food insecurity:    Worry: Not on file    Inability: Not on file  . Transportation needs:    Medical: Not on file    Non-medical: Not on file  Tobacco Use  . Smoking status: Former Smoker    Last attempt to quit: 08/17/1983    Years since quitting: 35.1  . Smokeless tobacco: Never Used  Substance and Sexual Activity  . Alcohol use: Yes    Alcohol/week: 0.0 standard drinks    Comment: rarely  . Drug use: No  . Sexual activity: Not on file  Lifestyle  . Physical activity:    Days per week: Not on file    Minutes per session: Not on file  . Stress: Not on file  Relationships  . Social connections:    Talks on phone: Not on file    Gets together: Not on file    Attends religious service: Not on file    Active member of club or organization: Not on file    Attends meetings of clubs or organizations: Not on file    Relationship status: Not on file  . Intimate partner violence:    Fear of current or ex partner: Not on file    Emotionally abused: Not on file    Physically abused: Not on file    Forced sexual activity: Not on file  Other Topics Concern  . Not on file  Social History Narrative  . Not on file    FAMILY HISTORY: Family History  Problem Relation Age of Onset  . Diabetes Mellitus II Father   . Thyroid disease Father   . Breast cancer Maternal Aunt   . Hypertension Mother   . Heart Problems Brother   . Leukemia Maternal Aunt   . Colon cancer Neg Hx   . Kidney cancer Neg Hx     . Bladder Cancer Neg Hx     ALLERGIES:  is allergic to augmentin [amoxicillin-pot clavulanate]; bactrim [sulfamethoxazole-trimethoprim]; doxycycline; hydrocodone-acetaminophen; levaquin [levofloxacin]; pseudoephedrine; relafen [nabumetone]; and shellfish allergy.  MEDICATIONS:  Current Outpatient Medications  Medication Sig Dispense Refill  . albuterol (PROVENTIL HFA;VENTOLIN HFA) 108 (90 BASE) MCG/ACT inhaler Inhale 2 puffs into the lungs every 4 (four) hours as needed. (Patient taking differently: Inhale 2 puffs into the lungs every 4 (four) hours as needed for wheezing or shortness of breath. ) 1 Inhaler 3  . albuterol (PROVENTIL) (2.5 MG/3ML) 0.083% nebulizer solution Take 3 mLs (2.5 mg total) by nebulization every 6 (six) hours as needed for wheezing or shortness of  breath. 150 mL 1  . aspirin EC 81 MG tablet Take 81 mg by mouth daily.    Marland Kitchen azelastine (ASTELIN) 0.1 % nasal spray Place 1 spray into both nostrils 2 (two) times daily. Use in each nostril as directed 90 mL 1  . budesonide-formoterol (SYMBICORT) 80-4.5 MCG/ACT inhaler Inhale 2 puffs into the lungs 2 (two) times daily. (Patient taking differently: Inhale 2 puffs into the lungs 2 (two) times daily as needed (short of breath). ) 1 Inhaler 3  . Calcium Citrate-Vitamin D (CALCIUM CITRATE + D PO) Take 1 tablet by mouth daily. 500/400    . Cholecalciferol (VITAMIN D-3) 1000 units CAPS Take 1 capsule by mouth daily.     . clobetasol ointment (TEMOVATE) 3.82 % Apply 1 application topically 2 (two) times daily.   0  . linaclotide (LINZESS) 290 MCG CAPS capsule Take 290 mcg by mouth daily before breakfast.    . magnesium oxide (MAG-OX) 400 MG tablet Take 400 mg daily by mouth.    . metoprolol succinate (TOPROL-XL) 50 MG 24 hr tablet TAKE 1 TABLET BY MOUTH  EVERY DAY OR IMMEDIATELY  FOLLOWING A MEAL 90 tablet 1  . montelukast (SINGULAIR) 10 MG tablet TAKE 1 TABLET BY MOUTH AT  BEDTIME (Patient taking differently: Take 10 mg by mouth at  bedtime. ) 90 tablet 1  . Multiple Vitamin (MULTIVITAMIN) tablet Take 1 tablet by mouth daily.    . naproxen sodium (ALEVE) 220 MG tablet Take 220-440 mg by mouth daily as needed (pain).    . NONFORMULARY OR COMPOUNDED ITEM Place 1 spray into the nose 2 (two) times daily. Budesonide + Saline Irrigation/Rinse    . ondansetron (ZOFRAN) 4 MG tablet Take 1 tablet (4 mg total) by mouth every 8 (eight) hours as needed for nausea or vomiting. 30 tablet 1  . RABEprazole (ACIPHEX) 20 MG tablet Take 20 mg by mouth 2 (two) times daily.    Marland Kitchen rOPINIRole (REQUIP) 2 MG tablet Take 2.5 mg by mouth daily.   1  . rosuvastatin (CRESTOR) 10 MG tablet TAKE 1 TABLET BY MOUTH  DAILY (Patient taking differently: Take 10 mg by mouth every Monday, Wednesday, and Friday. ) 90 tablet 1  . venlafaxine XR (EFFEXOR-XR) 150 MG 24 hr capsule TAKE 1 CAPSULE BY MOUTH  EVERY DAY WITH BREAKFAST (Patient taking differently: Take 150 mg by mouth daily with breakfast. TAKE 1 CAPSULE BY MOUTH EVERY DAY WITH BREAKFAST) 90 capsule 1  . vitamin B-12 (CYANOCOBALAMIN) 1000 MCG tablet Take 1,000 mcg by mouth daily.     No current facility-administered medications for this visit.      PHYSICAL EXAMINATION: ECOG PERFORMANCE STATUS: 1 - Symptomatic but completely ambulatory Vitals:   10/05/18 1006  BP: (!) 161/80   There were no vitals filed for this visit.  Physical Exam Constitutional:      General: She is not in acute distress. HENT:     Head: Normocephalic and atraumatic.  Eyes:     General: No scleral icterus.    Pupils: Pupils are equal, round, and reactive to light.  Neck:     Musculoskeletal: Normal range of motion and neck supple.  Cardiovascular:     Rate and Rhythm: Normal rate and regular rhythm.     Heart sounds: Normal heart sounds.  Pulmonary:     Effort: Pulmonary effort is normal. No respiratory distress.     Breath sounds: No wheezing.  Abdominal:     General: Bowel sounds are normal. There is  no distension.      Palpations: Abdomen is soft. There is no mass.     Tenderness: There is no abdominal tenderness.  Musculoskeletal: Normal range of motion.        General: No deformity.  Skin:    General: Skin is warm and dry.     Findings: No erythema or rash.  Neurological:     Mental Status: She is alert and oriented to person, place, and time.     Cranial Nerves: No cranial nerve deficit.     Coordination: Coordination normal.  Psychiatric:        Behavior: Behavior normal.        Thought Content: Thought content normal.      LABORATORY DATA:  I have reviewed the data as listed Lab Results  Component Value Date   WBC 8.8 09/12/2018   HGB 12.9 09/12/2018   HCT 41.5 09/12/2018   MCV 86.8 09/12/2018   PLT 477 (H) 09/12/2018   Recent Labs    04/04/18 1424 04/25/18 0810 05/18/18 0914 08/25/18 0817  NA 141 140 140 141  K 4.8 4.6 4.7 4.0  CL 103 106 106 105  CO2 23 28 28 27   GLUCOSE 90 103* 101* 100*  BUN 13 14 16 16   CREATININE 1.01* 1.08 1.02 0.99  CALCIUM 9.7 9.3 9.7 9.6  GFRNONAA 58*  --   --   --   GFRAA 67  --   --   --   PROT  --  7.5 7.5 7.8  ALBUMIN  --  3.9 4.1 4.1  AST  --  13 14 11   ALT  --  13 11 10   ALKPHOS  --  90 85 90  BILITOT  --  0.4 0.5 0.5  BILIDIR  --  0.1 0.1 0.1   Iron/TIBC/Ferritin/ %Sat    Component Value Date/Time   IRON 64 09/12/2018 1522   TIBC 413 09/12/2018 1522   FERRITIN 30 09/12/2018 1522   IRONPCTSAT 16 09/12/2018 1522     RADIOGRAPHIC STUDIES: I have personally reviewed the radiological images as listed and agreed with the findings in the report. 05/18/2018 CT abdomen pelvis showed no acute abnormality or mass identified in the abdomen or pelvis.  Unchanged chronic asymmetric left renal atrophy.  Aortic atherosclerosis.   ASSESSMENT & PLAN:  1. Thrombocytosis ([place])   Labs result and discussed with patient. Jak2 V617F mutation, CARL, MPL, E12-15 negative. Peripheral blood smear showed normal WBC morphology platelet appears  increased.  Platelet varies in size with large body platelets noted.  Iron panel showed TIBC 413, saturation 16, ferritin 30. Platelet count has decreased from 529,00 to 477,000.  Increase of platelet counts in the beginning of January likely secondary to upper respiratory infection. Discussed with patient that her iron panel is borderline and I recommend oral iron supplementations.  Patient reports that she had issue with oral supplements in the past.  We discussed about IV iron and she is interested. Plan IV iron with Venofer 200mg  weekly x 2 doses. Allergy reactions/infusion reaction including anaphylactic reaction discussed with patient. Other side effects include but not limited to high blood pressure, skin rash, weight gain, leg swelling, etc. Patient voices understanding and willing to proceed.  #Constipation she is scheduled to have colonoscopy on 10/20/2018.   Orders Placed This Encounter  Procedures  . CBC with Differential/Platelet    Standing Status:   Future    Standing Expiration Date:   10/06/2019  . Ferritin  Standing Status:   Future    Standing Expiration Date:   10/06/2019  . Iron and TIBC    Standing Status:   Future    Standing Expiration Date:   10/06/2019    All questions were answered. The patient knows to call the clinic with any problems questions or concerns.  Return of visit: 3 months for MD assessment, labs and possible IV Venofer.  Earlie Server, MD, PhD Hematology Oncology [facility] at [facility] Pager- [phone] 10/05/2018

## 2018-10-11 ENCOUNTER — Telehealth: Payer: Self-pay

## 2018-10-11 NOTE — Telephone Encounter (Signed)
Copied from [place] [id number]. Topic: General - Other >> Oct 11, 2018  3:31 PM Oneta Rack wrote: Relation to pt: self  Call back number: [phone]   Reason for call:  sore / irritated wart like substance on neck, patient requesting dermatologist referral, please advise

## 2018-10-12 ENCOUNTER — Other Ambulatory Visit: Payer: Self-pay | Admitting: Oncology

## 2018-10-12 ENCOUNTER — Other Ambulatory Visit: Payer: Self-pay | Admitting: Internal Medicine

## 2018-10-12 DIAGNOSIS — L989 Disorder of the skin and subcutaneous tissue, unspecified: Secondary | ICD-10-CM

## 2018-10-12 DIAGNOSIS — D509 Iron deficiency anemia, unspecified: Secondary | ICD-10-CM | POA: Insufficient documentation

## 2018-10-12 NOTE — Progress Notes (Signed)
Order placed for dermatology referral.  

## 2018-10-12 NOTE — Telephone Encounter (Signed)
Patient has a place on her neck that looks like a wart and is irritated and a little sore. She says she noticed it about a month and a half ago and thinks it has gotten bigger. She would like to have referral to dermatology to look into having it removed. Ok to place referral or will she need a visit here first?

## 2018-10-12 NOTE — Telephone Encounter (Signed)
Order placed for dermatology referral.  

## 2018-10-13 ENCOUNTER — Inpatient Hospital Stay: Payer: Medicare Other

## 2018-10-13 VITALS — BP 127/70 | HR 67 | Temp 98.3°F | Resp 18

## 2018-10-13 DIAGNOSIS — K5900 Constipation, unspecified: Secondary | ICD-10-CM | POA: Diagnosis not present

## 2018-10-13 DIAGNOSIS — Z87891 Personal history of nicotine dependence: Secondary | ICD-10-CM | POA: Diagnosis not present

## 2018-10-13 DIAGNOSIS — Z7982 Long term (current) use of aspirin: Secondary | ICD-10-CM | POA: Diagnosis not present

## 2018-10-13 DIAGNOSIS — Z79899 Other long term (current) drug therapy: Secondary | ICD-10-CM | POA: Diagnosis not present

## 2018-10-13 DIAGNOSIS — E785 Hyperlipidemia, unspecified: Secondary | ICD-10-CM | POA: Diagnosis not present

## 2018-10-13 DIAGNOSIS — D473 Essential (hemorrhagic) thrombocythemia: Secondary | ICD-10-CM | POA: Diagnosis not present

## 2018-10-13 DIAGNOSIS — D75839 Thrombocytosis, unspecified: Secondary | ICD-10-CM

## 2018-10-13 DIAGNOSIS — Z833 Family history of diabetes mellitus: Secondary | ICD-10-CM | POA: Diagnosis not present

## 2018-10-13 DIAGNOSIS — D509 Iron deficiency anemia, unspecified: Secondary | ICD-10-CM

## 2018-10-13 DIAGNOSIS — Z8249 Family history of ischemic heart disease and other diseases of the circulatory system: Secondary | ICD-10-CM | POA: Diagnosis not present

## 2018-10-13 MED ORDER — SODIUM CHLORIDE 0.9 % IV SOLN: 200.0000 mg | Freq: Once | INTRAVENOUS | Status: DC

## 2018-10-13 MED ORDER — SODIUM CHLORIDE 0.9 % IV SOLN
Freq: Once | INTRAVENOUS | Status: AC
  Administered 2018-10-13: 14:00:00 via INTRAVENOUS
  Filled 2018-10-13: qty 250

## 2018-10-13 MED ORDER — IRON SUCROSE 20 MG/ML IV SOLN
200.0000 mg | Freq: Once | INTRAVENOUS | Status: AC
  Administered 2018-10-13: 200 mg via INTRAVENOUS
  Filled 2018-10-13: qty 10

## 2018-10-17 ENCOUNTER — Other Ambulatory Visit: Payer: Self-pay | Admitting: Internal Medicine

## 2018-10-19 ENCOUNTER — Inpatient Hospital Stay: Payer: Medicare Other

## 2018-10-20 ENCOUNTER — Ambulatory Visit
Admission: RE | Admit: 2018-10-20 | Discharge: 2018-10-20 | Disposition: A | Discharge status: Home / Self Care | Payer: Medicare Other | Attending: Gastroenterology | Admitting: Gastroenterology

## 2018-10-20 ENCOUNTER — Ambulatory Visit: Payer: Medicare Other | Admitting: Anesthesiology

## 2018-10-20 ENCOUNTER — Encounter
Admission: RE | Disposition: A | Discharge status: Home / Self Care | Payer: Self-pay | Source: Home / Self Care | Attending: Gastroenterology

## 2018-10-20 DIAGNOSIS — I872 Venous insufficiency (chronic) (peripheral): Secondary | ICD-10-CM | POA: Diagnosis not present

## 2018-10-20 DIAGNOSIS — F329 Major depressive disorder, single episode, unspecified: Secondary | ICD-10-CM | POA: Insufficient documentation

## 2018-10-20 DIAGNOSIS — Z8719 Personal history of other diseases of the digestive system: Secondary | ICD-10-CM | POA: Diagnosis not present

## 2018-10-20 DIAGNOSIS — G4730 Sleep apnea, unspecified: Secondary | ICD-10-CM | POA: Diagnosis not present

## 2018-10-20 DIAGNOSIS — E7800 Pure hypercholesterolemia, unspecified: Secondary | ICD-10-CM | POA: Diagnosis not present

## 2018-10-20 DIAGNOSIS — Z885 Allergy status to narcotic agent status: Secondary | ICD-10-CM | POA: Insufficient documentation

## 2018-10-20 DIAGNOSIS — Z6838 Body mass index (BMI) 38.0-38.9, adult: Secondary | ICD-10-CM | POA: Insufficient documentation

## 2018-10-20 DIAGNOSIS — E785 Hyperlipidemia, unspecified: Secondary | ICD-10-CM | POA: Insufficient documentation

## 2018-10-20 DIAGNOSIS — Z880 Allergy status to penicillin: Secondary | ICD-10-CM | POA: Diagnosis not present

## 2018-10-20 DIAGNOSIS — D125 Benign neoplasm of sigmoid colon: Secondary | ICD-10-CM | POA: Diagnosis not present

## 2018-10-20 DIAGNOSIS — D126 Benign neoplasm of colon, unspecified: Secondary | ICD-10-CM | POA: Diagnosis not present

## 2018-10-20 DIAGNOSIS — D124 Benign neoplasm of descending colon: Secondary | ICD-10-CM | POA: Insufficient documentation

## 2018-10-20 DIAGNOSIS — I5032 Chronic diastolic (congestive) heart failure: Secondary | ICD-10-CM | POA: Insufficient documentation

## 2018-10-20 DIAGNOSIS — K5790 Diverticulosis of intestine, part unspecified, without perforation or abscess without bleeding: Secondary | ICD-10-CM | POA: Diagnosis not present

## 2018-10-20 DIAGNOSIS — Z79899 Other long term (current) drug therapy: Secondary | ICD-10-CM | POA: Diagnosis not present

## 2018-10-20 DIAGNOSIS — K5900 Constipation, unspecified: Secondary | ICD-10-CM | POA: Insufficient documentation

## 2018-10-20 DIAGNOSIS — F419 Anxiety disorder, unspecified: Secondary | ICD-10-CM | POA: Insufficient documentation

## 2018-10-20 DIAGNOSIS — R634 Abnormal weight loss: Secondary | ICD-10-CM | POA: Insufficient documentation

## 2018-10-20 DIAGNOSIS — Z881 Allergy status to other antibiotic agents status: Secondary | ICD-10-CM | POA: Insufficient documentation

## 2018-10-20 DIAGNOSIS — E559 Vitamin D deficiency, unspecified: Secondary | ICD-10-CM | POA: Insufficient documentation

## 2018-10-20 DIAGNOSIS — K5730 Diverticulosis of large intestine without perforation or abscess without bleeding: Secondary | ICD-10-CM | POA: Diagnosis not present

## 2018-10-20 DIAGNOSIS — M1990 Unspecified osteoarthritis, unspecified site: Secondary | ICD-10-CM | POA: Diagnosis not present

## 2018-10-20 DIAGNOSIS — G2581 Restless legs syndrome: Secondary | ICD-10-CM | POA: Diagnosis not present

## 2018-10-20 DIAGNOSIS — I110 Hypertensive heart disease with heart failure: Secondary | ICD-10-CM | POA: Diagnosis not present

## 2018-10-20 DIAGNOSIS — J45909 Unspecified asthma, uncomplicated: Secondary | ICD-10-CM | POA: Diagnosis not present

## 2018-10-20 DIAGNOSIS — R194 Change in bowel habit: Secondary | ICD-10-CM | POA: Diagnosis not present

## 2018-10-20 DIAGNOSIS — Z7951 Long term (current) use of inhaled steroids: Secondary | ICD-10-CM | POA: Insufficient documentation

## 2018-10-20 DIAGNOSIS — Z7982 Long term (current) use of aspirin: Secondary | ICD-10-CM | POA: Diagnosis not present

## 2018-10-20 DIAGNOSIS — I5030 Unspecified diastolic (congestive) heart failure: Secondary | ICD-10-CM | POA: Diagnosis not present

## 2018-10-20 DIAGNOSIS — E669 Obesity, unspecified: Secondary | ICD-10-CM | POA: Insufficient documentation

## 2018-10-20 DIAGNOSIS — K635 Polyp of colon: Secondary | ICD-10-CM | POA: Diagnosis not present

## 2018-10-20 HISTORY — PX: COLONOSCOPY WITH PROPOFOL: SHX5780

## 2018-10-20 SURGERY — COLONOSCOPY WITH PROPOFOL
Anesthesia: General

## 2018-10-20 MED ORDER — SODIUM CHLORIDE 0.9 % IV SOLN
INTRAVENOUS | Status: DC
  Administered 2018-10-20: 1000 mL via INTRAVENOUS

## 2018-10-20 MED ORDER — PROPOFOL 500 MG/50ML IV EMUL
INTRAVENOUS | Status: DC | PRN
  Administered 2018-10-20: 125 ug/kg/min via INTRAVENOUS

## 2018-10-20 MED ORDER — SODIUM CHLORIDE 0.9 % IV SOLN: INTRAVENOUS | Status: DC

## 2018-10-20 MED ORDER — PROPOFOL 500 MG/50ML IV EMUL
INTRAVENOUS | Status: AC
  Filled 2018-10-20: qty 50

## 2018-10-20 MED ORDER — PROPOFOL 10 MG/ML IV BOLUS
INTRAVENOUS | Status: DC | PRN
  Administered 2018-10-20: 60 mg via INTRAVENOUS

## 2018-10-20 NOTE — Transfer of Care (Signed)
Immediate Anesthesia Transfer of Care Note  Patient: Nicole Sims  Procedure(s) Performed: COLONOSCOPY WITH PROPOFOL (N/A )  Patient Location: Endoscopy Unit  Anesthesia Type:General  Level of Consciousness: awake, alert  and oriented  Airway & Oxygen Therapy: Patient Spontanous Breathing and Patient connected to nasal cannula oxygen  Post-op Assessment: Report given to RN and Post -op Vital signs reviewed and stable  Post vital signs: Reviewed and stable  Last Vitals:  Vitals Value Taken Time  BP    Temp    Pulse    Resp    SpO2      Last Pain:  Vitals:   10/20/18 1237  TempSrc: Tympanic  PainSc: 3          Complications: No apparent anesthesia complications

## 2018-10-20 NOTE — H&P (Signed)
Outpatient short stay form Pre-procedure 10/20/2018 1:45 PM Lollie Sails MD  Primary Physician: Dr. Einar Pheasant  Reason for visit: Colonoscopy  History of present illness: Patient is a 68 year old female presenting today for colonoscopy in regards to a history of change in bowel habits, weight loss and constipation.  Last colonoscopy was in 2017.  Hyperplastic polyp noted and removed at that time.  He has been taking Linzess that has been quite helpful for her although her schedule is difficult for her to maintain a daily.  He is being evaluated by hematology for thrombocytosis.    Current Facility-Administered Medications:  .  0.9 %  sodium chloride infusion, , Intravenous, Continuous, Lollie Sails, MD, Last Rate: 20 mL/hr at 10/20/18 1250, 1,000 mL at 10/20/18 1250 .  0.9 %  sodium chloride infusion, , Intravenous, Continuous, Lollie Sails, MD .  0.9 %  sodium chloride infusion, , Intravenous, Continuous, Lollie Sails, MD  Medications Prior to Admission  Medication Sig Dispense Refill Last Dose  . albuterol (PROVENTIL HFA;VENTOLIN HFA) 108 (90 BASE) MCG/ACT inhaler Inhale 2 puffs into the lungs every 4 (four) hours as needed. (Patient taking differently: Inhale 2 puffs into the lungs every 4 (four) hours as needed for wheezing or shortness of breath. ) 1 Inhaler 3 Past Week at Unknown time  . albuterol (PROVENTIL) (2.5 MG/3ML) 0.083% nebulizer solution Take 3 mLs (2.5 mg total) by nebulization every 6 (six) hours as needed for wheezing or shortness of breath. 150 mL 1 Past Week at Unknown time  . aspirin EC 81 MG tablet Take 81 mg by mouth daily.   Past Week at Unknown time  . azelastine (ASTELIN) 0.1 % nasal spray Place 1 spray into both nostrils 2 (two) times daily. Use in each nostril as directed 90 mL 1 Past Week at Unknown time  . budesonide-formoterol (SYMBICORT) 80-4.5 MCG/ACT inhaler Inhale 2 puffs into the lungs 2 (two) times daily. (Patient taking  differently: Inhale 2 puffs into the lungs 2 (two) times daily as needed (short of breath). ) 1 Inhaler 3 Past Week at Unknown time  . Calcium Citrate-Vitamin D (CALCIUM CITRATE + D PO) Take 1 tablet by mouth daily. 500/400   Past Week at Unknown time  . Cholecalciferol (VITAMIN D-3) 1000 units CAPS Take 1 capsule by mouth daily.    Past Week at Unknown time  . clobetasol ointment (TEMOVATE) 0.73 % Apply 1 application topically 2 (two) times daily.   0 Past Week at Unknown time  . metoprolol succinate (TOPROL-XL) 50 MG 24 hr tablet TAKE 1 TABLET BY MOUTH  EVERY DAY OR IMMEDIATELY  FOLLOWING A MEAL 90 tablet 1 10/20/2018 at 1200  . montelukast (SINGULAIR) 10 MG tablet TAKE 1 TABLET BY MOUTH AT  BEDTIME 90 tablet 1 Past Week at Unknown time  . Multiple Vitamin (MULTIVITAMIN) tablet Take 1 tablet by mouth daily.   Past Week at Unknown time  . naproxen sodium (ALEVE) 220 MG tablet Take 220-440 mg by mouth daily as needed (pain).   Past Week at Unknown time  . NONFORMULARY OR COMPOUNDED ITEM Place 1 spray into the nose 2 (two) times daily. Budesonide + Saline Irrigation/Rinse   Past Week at Unknown time  . ondansetron (ZOFRAN) 4 MG tablet Take 1 tablet (4 mg total) by mouth every 8 (eight) hours as needed for nausea or vomiting. 30 tablet 1 Past Week at Unknown time  . RABEprazole (ACIPHEX) 20 MG tablet Take 20 mg by mouth 2 (  two) times daily.   10/19/2018 at Unknown time  . rOPINIRole (REQUIP) 2 MG tablet Take 2.5 mg by mouth daily.   1 10/19/2018 at Unknown time  . rosuvastatin (CRESTOR) 10 MG tablet TAKE 1 TABLET BY MOUTH  DAILY (Patient taking differently: Take 10 mg by mouth every Monday, Wednesday, and Friday. ) 90 tablet 1 Past Week at Unknown time  . venlafaxine XR (EFFEXOR-XR) 150 MG 24 hr capsule Take 1 capsule (150 mg total) by mouth daily with breakfast. TAKE 1 CAPSULE BY MOUTH EVERY DAY WITH BREAKFAST 90 capsule 0 Past Week at Unknown time  . vitamin B-12 (CYANOCOBALAMIN) 1000 MCG tablet Take 1,000  mcg by mouth daily.   Past Week at Unknown time  . linaclotide (LINZESS) 290 MCG CAPS capsule Take 290 mcg by mouth daily before breakfast.   Taking  . magnesium oxide (MAG-OX) 400 MG tablet Take 400 mg daily by mouth.   Taking     Allergies  Allergen Reactions  . Augmentin [Amoxicillin-Pot Clavulanate] Other (See Comments)    Questionable itching  . Bactrim [Sulfamethoxazole-Trimethoprim]   . Doxycycline Itching  . Hydrocodone-Acetaminophen Other (See Comments)    GI distress  . Levaquin [Levofloxacin]   . Pseudoephedrine     Itching of the scalp  . Relafen [Nabumetone] Other (See Comments)    Itching/rash  . Shellfish Allergy     Nausea and vomiting      Past Medical History:  Diagnosis Date  . Anemia   . Anxiety   . Asthma   . Cataract cortical, senile   . Chronic headaches   . Depression   . Diastolic heart failure ([place])   . Diverticulosis   . Environmental allergies   . GERD (gastroesophageal reflux disease)   . Heart murmur   . Heart murmur   . Hypercholesterolemia   . Hyperglycemia   . Hyperlipidemia   . Hypertension   . Obesity   . Osteoarthritis   . Palpitations   . Restless leg syndrome   . Sleep apnea   . Thrombocytosis ([place])   . Urinary incontinence    mixed  . Urinary incontinence   . Venous insufficiency   . Vitamin D deficiency     Review of systems:      Physical Exam    Heart and lungs: Rhythm without rub or gallop, lungs are bilaterally clear.    HEENT: Normocephalic atraumatic eyes are anicteric    Other:    Pertinant exam for procedure: Soft nontender nondistended bowel sounds positive normoactive.    Planned proceedures: Colonoscopy and indicated procedures. I have discussed the risks benefits and complications of procedures to include not limited to bleeding, infection, perforation and the risk of sedation and the patient wishes to proceed.    Lollie Sails, MD Gastroenterology 10/20/2018  1:45 PM

## 2018-10-20 NOTE — Anesthesia Post-op Follow-up Note (Signed)
Anesthesia QCDR form completed.        

## 2018-10-20 NOTE — Op Note (Signed)
[facility] Gastroenterology Patient Name: Nicole Sims Procedure Date: 10/20/2018 1:27 PM MRN: [id number] Account #: [id number] Date of Birth: 19-Jul-1951 Admit Type: Outpatient Age: 68 Room: [facility] ENDO ROOM 3 Gender: Female Note Status: Finalized Procedure:            Colonoscopy Indications:          Change in bowel habits, Constipation Providers:            Lollie Sails, MD Medicines:            Monitored Anesthesia Care Complications:        No immediate complications. Procedure:            Pre-Anesthesia Assessment:                       - ASA Grade Assessment: III - A patient with severe                        systemic disease.                       After obtaining informed consent, the colonoscope was                        passed under direct vision. Throughout the procedure,                        the patient's blood pressure, pulse, and oxygen                        saturations were monitored continuously. The                        Colonoscope was introduced through the anus and                        advanced to the the cecum, identified by appendiceal                        orifice and ileocecal valve. The colonoscopy was                        performed without difficulty. The patient tolerated the                        procedure well. The quality of the bowel preparation                        was good. Findings:      A 5 mm polyp was found in the descending colon. The polyp was sessile.       The polyp was removed with a cold snare. Resection and retrieval were       complete.      A 3 mm polyp was found in the sigmoid colon. The polyp was sessile. The       polyp was removed with a cold biopsy forceps. Resection and retrieval       were complete.      Biopsies for histology were taken with a cold forceps from the right       colon and left colon for evaluation of microscopic colitis.  Multiple small-mouthed diverticula were found in the  sigmoid colon,       descending colon and transverse colon.      The digital rectal exam was normal.      The terminal ileum appeared normal. Impression:           - One 5 mm polyp in the descending colon, removed with                        a cold snare. Resected and retrieved.                       - One 3 mm polyp in the sigmoid colon, removed with a                        cold biopsy forceps. Resected and retrieved.                       - Diverticulosis in the sigmoid colon, in the                        descending colon and in the transverse colon.                       - Biopsies were taken with a cold forceps from the                        right colon and left colon for evaluation of                        microscopic colitis. Recommendation:       - Continue present medications.                       - Return to GI clinic in 1 month.                       - Await pathology results.                       - Telephone GI clinic for pathology results in 5 days. Lollie Sails, MD 10/20/2018 2:26:26 PM This report has been signed electronically. Number of Addenda: 0 Note Initiated On: 10/20/2018 1:27 PM Scope Withdrawal Time: 0 hours 17 minutes 47 seconds  Total Procedure Duration: 0 hours 29 minutes 16 seconds       [facility]

## 2018-10-20 NOTE — Anesthesia Postprocedure Evaluation (Signed)
Anesthesia Post Note  Patient: Nicole Sims  Procedure(s) Performed: COLONOSCOPY WITH PROPOFOL (N/A )  Patient location during evaluation: Endoscopy Anesthesia Type: General Level of consciousness: awake and alert Pain management: pain level controlled Vital Signs Assessment: post-procedure vital signs reviewed and stable Respiratory status: spontaneous breathing, nonlabored ventilation, respiratory function stable and patient connected to nasal cannula oxygen Cardiovascular status: blood pressure returned to baseline and stable Postop Assessment: no apparent nausea or vomiting Anesthetic complications: no     Last Vitals:  Vitals:   10/20/18 1456 10/20/18 1506  BP: 129/67 117/69  Pulse: 70 72  Resp: 15 16  Temp:    SpO2: 100% 100%    Last Pain:  Vitals:   10/20/18 1506  TempSrc:   PainSc: 0-No pain                 Ameyah Bangura S

## 2018-10-20 NOTE — Anesthesia Preprocedure Evaluation (Addendum)
Anesthesia Evaluation  Patient identified by MRN, date of birth, ID band Patient awake    Reviewed: Allergy & Precautions, H&P , NPO status , Patient's Chart, lab work & pertinent test results  Airway Mallampati: II       Dental  (+) Partial Upper   Pulmonary shortness of breath and with exertion, asthma , sleep apnea , Recent URI  (flu in January 2020), Resolved, former smoker,           Cardiovascular hypertension, + Valvular Problems/Murmurs (murmur)      Neuro/Psych  Headaches, PSYCHIATRIC DISORDERS Anxiety Depression    GI/Hepatic Neg liver ROS, GERD  Controlled,  Endo/Other  negative endocrine ROS  Renal/GU negative Renal ROS  negative genitourinary   Musculoskeletal  (+) Arthritis ,   Abdominal   Peds  Hematology  (+) Blood dyscrasia, anemia ,   Anesthesia Other Findings Past Medical History: No date: Anemia No date: Anxiety No date: Asthma No date: Cataract cortical, senile No date: Chronic headaches No date: Depression No date: Diastolic heart failure (HCC) No date: Diverticulosis No date: Environmental allergies No date: GERD (gastroesophageal reflux disease) No date: Heart murmur No date: Heart murmur No date: Hypercholesterolemia No date: Hyperglycemia No date: Hyperlipidemia No date: Hypertension No date: Obesity No date: Osteoarthritis No date: Palpitations No date: Restless leg syndrome No date: Sleep apnea No date: Thrombocytosis (HCC) No date: Urinary incontinence     Comment:  mixed No date: Urinary incontinence No date: Venous insufficiency No date: Vitamin D deficiency  Past Surgical History: No date: BLADDER SURGERY     Comment:  x2   [facility] 2005: BREAST CYST ASPIRATION; Bilateral     Comment:  approximate year No date: CARDIAC CATHETERIZATION     Comment:  [place] No date: CERVICAL CONE BIOPSY     Comment:  CIS No date: CHOLECYSTECTOMY 07/19/2016:  COLONOSCOPY WITH PROPOFOL; N/A     Comment:  Procedure: COLONOSCOPY WITH PROPOFOL;  Surgeon: Manya Silvas, MD;  Location: [facility] ENDOSCOPY;  Service:               Endoscopy;  Laterality: N/A; 07/19/2016: ESOPHAGOGASTRODUODENOSCOPY (EGD) WITH PROPOFOL; N/A     Comment:  Procedure: ESOPHAGOGASTRODUODENOSCOPY (EGD) WITH               PROPOFOL;  Surgeon: Manya Silvas, MD;  Location: [facility]              ENDOSCOPY;  Service: Endoscopy;  Laterality: N/A; No date: FLEXIBLE SIGMOIDOSCOPY 08/13/08: KNEE ARTHROSCOPY No date: knee replacement and revision     Comment:  left No date: RECTOCELE REPAIR 04/10/2018: RIGHT/LEFT HEART CATH AND CORONARY ANGIOGRAPHY; Bilateral     Comment:  Procedure: RIGHT/LEFT HEART CATH AND CORONARY               ANGIOGRAPHY;  Surgeon: Wellington Hampshire, MD;  Location:               [place] CV LAB;  Service: Cardiovascular;                Laterality: Bilateral; No date: ROTATOR CUFF REPAIR     Comment:  bilateral 11/17/05: SHOULDER SURGERY 1962: TONSILLECTOMY 1974: VAGINAL HYSTERECTOMY     Comment:  abnormal pap and carcinoma in situ     Reproductive/Obstetrics negative OB ROS  Anesthesia Physical Anesthesia Plan  ASA: III  Anesthesia Plan: General   Post-op Pain Management:    Induction:   PONV Risk Score and Plan: Propofol infusion and TIVA  Airway Management Planned: Natural Airway and Nasal Cannula  Additional Equipment:   Intra-op Plan:   Post-operative Plan:   Informed Consent: I have reviewed the patients History and Physical, chart, labs and discussed the procedure including the risks, benefits and alternatives for the proposed anesthesia with the patient or authorized representative who has indicated his/her understanding and acceptance.     Dental Advisory Given  Plan Discussed with: Anesthesiologist and CRNA  Anesthesia Plan Comments:         Anesthesia Quick  Evaluation

## 2018-10-21 ENCOUNTER — Encounter: Payer: Self-pay | Admitting: Gastroenterology

## 2018-10-24 LAB — SURGICAL PATHOLOGY

## 2018-10-25 ENCOUNTER — Telehealth: Payer: Self-pay

## 2018-10-25 NOTE — Telephone Encounter (Signed)
Can we send this over to [facility]?

## 2018-10-25 NOTE — Telephone Encounter (Signed)
Copied from [place] [id number]. Topic: Referral - Question >> Oct 25, 2018 11:18 AM Reyne Dumas L wrote: Reason for CRM:   Pt states that dermatologist she was given referral to is out of her network.  Pt is requesting referral go to [facility] (Dr. Sarina Ser or Dr. Jenene Slicker)

## 2018-10-26 ENCOUNTER — Other Ambulatory Visit: Payer: Self-pay

## 2018-10-26 ENCOUNTER — Inpatient Hospital Stay: Payer: Medicare Other | Attending: Oncology

## 2018-10-26 VITALS — BP 127/76 | HR 65 | Temp 97.7°F | Resp 18

## 2018-10-26 DIAGNOSIS — D509 Iron deficiency anemia, unspecified: Secondary | ICD-10-CM | POA: Diagnosis not present

## 2018-10-26 DIAGNOSIS — D473 Essential (hemorrhagic) thrombocythemia: Secondary | ICD-10-CM | POA: Diagnosis not present

## 2018-10-26 DIAGNOSIS — D75839 Thrombocytosis, unspecified: Secondary | ICD-10-CM

## 2018-10-26 MED ORDER — IRON SUCROSE 20 MG/ML IV SOLN
200.0000 mg | Freq: Once | INTRAVENOUS | Status: AC
  Administered 2018-10-26: 200 mg via INTRAVENOUS
  Filled 2018-10-26: qty 10

## 2018-10-26 MED ORDER — SODIUM CHLORIDE 0.9 % IV SOLN
Freq: Once | INTRAVENOUS | Status: AC
  Administered 2018-10-26: 14:00:00 via INTRAVENOUS
  Filled 2018-10-26: qty 250

## 2018-11-22 DIAGNOSIS — I5033 Acute on chronic diastolic (congestive) heart failure: Secondary | ICD-10-CM | POA: Insufficient documentation

## 2018-11-28 DIAGNOSIS — K581 Irritable bowel syndrome with constipation: Secondary | ICD-10-CM | POA: Diagnosis not present

## 2018-11-28 DIAGNOSIS — K210 Gastro-esophageal reflux disease with esophagitis: Secondary | ICD-10-CM | POA: Diagnosis not present

## 2018-11-28 DIAGNOSIS — R110 Nausea: Secondary | ICD-10-CM | POA: Diagnosis not present

## 2019-01-03 ENCOUNTER — Telehealth: Payer: Self-pay | Admitting: *Deleted

## 2019-01-03 ENCOUNTER — Other Ambulatory Visit (INDEPENDENT_AMBULATORY_CARE_PROVIDER_SITE_OTHER): Payer: Medicare Other

## 2019-01-03 ENCOUNTER — Other Ambulatory Visit: Payer: Self-pay

## 2019-01-03 ENCOUNTER — Encounter: Payer: Self-pay | Admitting: Internal Medicine

## 2019-01-03 DIAGNOSIS — R739 Hyperglycemia, unspecified: Secondary | ICD-10-CM | POA: Diagnosis not present

## 2019-01-03 DIAGNOSIS — E559 Vitamin D deficiency, unspecified: Secondary | ICD-10-CM | POA: Diagnosis not present

## 2019-01-03 DIAGNOSIS — I10 Essential (primary) hypertension: Secondary | ICD-10-CM | POA: Diagnosis not present

## 2019-01-03 DIAGNOSIS — E7800 Pure hypercholesterolemia, unspecified: Secondary | ICD-10-CM

## 2019-01-03 DIAGNOSIS — M545 Low back pain: Secondary | ICD-10-CM | POA: Diagnosis not present

## 2019-01-03 DIAGNOSIS — M47816 Spondylosis without myelopathy or radiculopathy, lumbar region: Secondary | ICD-10-CM | POA: Diagnosis not present

## 2019-01-03 DIAGNOSIS — M5136 Other intervertebral disc degeneration, lumbar region: Secondary | ICD-10-CM | POA: Diagnosis not present

## 2019-01-03 LAB — HEPATIC FUNCTION PANEL
ALT: 13 U/L (ref 0–35)
AST: 13 U/L (ref 0–37)
Albumin: 4.1 g/dL (ref 3.5–5.2)
Alkaline Phosphatase: 99 U/L (ref 39–117)
Bilirubin, Direct: 0.1 mg/dL (ref 0.0–0.3)
Total Bilirubin: 0.4 mg/dL (ref 0.2–1.2)
Total Protein: 7.5 g/dL (ref 6.0–8.3)

## 2019-01-03 LAB — BASIC METABOLIC PANEL
BUN: 13 mg/dL (ref 6–23)
CO2: 28 mEq/L (ref 19–32)
Calcium: 9.4 mg/dL (ref 8.4–10.5)
Chloride: 105 mEq/L (ref 96–112)
Creatinine, Ser: 0.95 mg/dL (ref 0.40–1.20)
GFR: 58.38 mL/min — ABNORMAL LOW (ref >60.00)
Glucose, Bld: 97 mg/dL (ref 70–99)
Potassium: 4.5 mEq/L (ref 3.5–5.1)
Sodium: 140 mEq/L (ref 135–145)

## 2019-01-03 LAB — LIPID PANEL
Cholesterol: 202 mg/dL — ABNORMAL HIGH (ref 0–200)
HDL: 66.4 mg/dL (ref >39.00)
LDL Cholesterol: 113 mg/dL — ABNORMAL HIGH (ref 0–99)
NonHDL: 135.49
Total CHOL/HDL Ratio: 3
Triglycerides: 112 mg/dL (ref 0.0–149.0)
VLDL: 22.4 mg/dL (ref 0.0–40.0)

## 2019-01-03 LAB — VITAMIN D 25 HYDROXY (VIT D DEFICIENCY, FRACTURES): VITD: 35.04 ng/mL (ref 30.00–100.00)

## 2019-01-03 LAB — HEMOGLOBIN A1C: Hgb A1c MFr Bld: 6.1 % (ref 4.6–6.5)

## 2019-01-03 NOTE — Telephone Encounter (Signed)
Pt came in for labs this morning & states that she called yesterday & asked to had her Vit D added on. Please place future order

## 2019-01-03 NOTE — Telephone Encounter (Signed)
Order placed for vitamin D lab.   

## 2019-01-03 NOTE — Telephone Encounter (Signed)
thanks

## 2019-01-03 NOTE — Addendum Note (Signed)
Addended by: Leeanne Rio on: 01/03/2019 10:39 AM   Modules accepted: Orders

## 2019-01-05 ENCOUNTER — Ambulatory Visit: Payer: Medicare Other | Admitting: Internal Medicine

## 2019-01-09 ENCOUNTER — Ambulatory Visit (INDEPENDENT_AMBULATORY_CARE_PROVIDER_SITE_OTHER): Payer: Medicare Other | Admitting: Internal Medicine

## 2019-01-09 ENCOUNTER — Other Ambulatory Visit: Payer: Self-pay | Admitting: Internal Medicine

## 2019-01-09 ENCOUNTER — Emergency Department
Admission: EM | Admit: 2019-01-09 | Discharge: 2019-01-09 | Disposition: A | Discharge status: Home / Self Care | Payer: Medicare Other | Attending: Emergency Medicine | Admitting: Emergency Medicine

## 2019-01-09 ENCOUNTER — Other Ambulatory Visit: Payer: Self-pay

## 2019-01-09 ENCOUNTER — Telehealth: Payer: Self-pay | Admitting: *Deleted

## 2019-01-09 ENCOUNTER — Ambulatory Visit: Payer: Self-pay | Admitting: *Deleted

## 2019-01-09 ENCOUNTER — Emergency Department: Payer: Medicare Other

## 2019-01-09 ENCOUNTER — Encounter: Payer: Self-pay | Admitting: *Deleted

## 2019-01-09 ENCOUNTER — Encounter: Payer: Self-pay | Admitting: Internal Medicine

## 2019-01-09 DIAGNOSIS — K219 Gastro-esophageal reflux disease without esophagitis: Secondary | ICD-10-CM | POA: Diagnosis not present

## 2019-01-09 DIAGNOSIS — I5030 Unspecified diastolic (congestive) heart failure: Secondary | ICD-10-CM | POA: Insufficient documentation

## 2019-01-09 DIAGNOSIS — R0600 Dyspnea, unspecified: Secondary | ICD-10-CM | POA: Diagnosis not present

## 2019-01-09 DIAGNOSIS — R0602 Shortness of breath: Secondary | ICD-10-CM

## 2019-01-09 DIAGNOSIS — Z20828 Contact with and (suspected) exposure to other viral communicable diseases: Secondary | ICD-10-CM | POA: Insufficient documentation

## 2019-01-09 DIAGNOSIS — Z87891 Personal history of nicotine dependence: Secondary | ICD-10-CM | POA: Diagnosis not present

## 2019-01-09 DIAGNOSIS — R079 Chest pain, unspecified: Secondary | ICD-10-CM | POA: Diagnosis not present

## 2019-01-09 DIAGNOSIS — J45909 Unspecified asthma, uncomplicated: Secondary | ICD-10-CM | POA: Diagnosis not present

## 2019-01-09 DIAGNOSIS — Z7982 Long term (current) use of aspirin: Secondary | ICD-10-CM | POA: Diagnosis not present

## 2019-01-09 DIAGNOSIS — I110 Hypertensive heart disease with heart failure: Secondary | ICD-10-CM | POA: Insufficient documentation

## 2019-01-09 DIAGNOSIS — Z79899 Other long term (current) drug therapy: Secondary | ICD-10-CM | POA: Insufficient documentation

## 2019-01-09 LAB — BASIC METABOLIC PANEL
Anion gap: 12 (ref 5–15)
BUN: 17 mg/dL (ref 8–23)
CO2: 21 mmol/L — ABNORMAL LOW (ref 22–32)
Calcium: 9.1 mg/dL (ref 8.9–10.3)
Chloride: 105 mmol/L (ref 98–111)
Creatinine, Ser: 0.98 mg/dL (ref 0.44–1.00)
GFR calc Af Amer: >60 mL/min (ref >60)
GFR calc non Af Amer: 59 mL/min — ABNORMAL LOW (ref >60)
Glucose, Bld: 94 mg/dL (ref 70–99)
Potassium: 3.5 mmol/L (ref 3.5–5.1)
Sodium: 138 mmol/L (ref 135–145)

## 2019-01-09 LAB — CBC
HCT: 42.2 % (ref 36.0–46.0)
Hemoglobin: 13.4 g/dL (ref 12.0–15.0)
MCH: 28 pg (ref 26.0–34.0)
MCHC: 31.8 g/dL (ref 30.0–36.0)
MCV: 88.3 fL (ref 80.0–100.0)
Platelets: 490 10*3/uL — ABNORMAL HIGH (ref 150–400)
RBC: 4.78 MIL/uL (ref 3.87–5.11)
RDW: 14.2 % (ref 11.5–15.5)
WBC: 18.7 10*3/uL — ABNORMAL HIGH (ref 4.0–10.5)
nRBC: 0 % (ref 0.0–0.2)

## 2019-01-09 LAB — TROPONIN I: Troponin I: <0.03 ng/mL (ref <0.03)

## 2019-01-09 LAB — SARS CORONAVIRUS 2 BY RT PCR (HOSPITAL ORDER, PERFORMED IN ~~LOC~~ HOSPITAL LAB): SARS Coronavirus 2: NEGATIVE

## 2019-01-09 NOTE — Telephone Encounter (Signed)
Called to triage patient and to schedule an appointment.  No answer.  LMTCB.

## 2019-01-09 NOTE — Assessment & Plan Note (Signed)
Has been evaluated recently and is followed by GI.  On aciphex and carafate recently added.  No increased symptoms reported.  Follow.

## 2019-01-09 NOTE — Telephone Encounter (Signed)
Pt has an appt at 330  

## 2019-01-09 NOTE — Assessment & Plan Note (Signed)
Describes increased sob associated with increased cough and chest discomfort/tightness as outlined.  Discussed possible etiologies, including cardiac, infectious, etc.  Given worsening symptoms despite inhalers, prednisone, singulair, will refer to ER for further testing and evaluation.  Pt in agreement.  Instructed to wear a mask.  ER notified.

## 2019-01-09 NOTE — Assessment & Plan Note (Signed)
Chest discomfort and tightness as outlined.  With increased sob and cough.  Discussed possible etiologies.  Some breathlessness during our conversation.  Has been seen and evaluated by cardiology previously.  W/up then unrevealing.  Discussed possible infectious etiology and the possibility of COVID.  Discussed the need for further evaluation and testing.  She agreed.  Given worsening symptoms and sob despite prednisone and her inhalers, will refer her to ER for further evaluation and treatment.  ER notifed.

## 2019-01-09 NOTE — Progress Notes (Signed)
Patient ID: Nicole Sims, female   DOB: [birth year], 68 y.o.   MRN: [id number]   Virtual Visit via video Note  This visit type was conducted due to national recommendations for restrictions regarding the COVID-19 pandemic (e.g. social distancing).  This format is felt to be most appropriate for this patient at this time.  All issues noted in this document were discussed and addressed.  No physical exam was performed (except for noted visual exam findings with Video Visits).   I connected with [name] by a video enabled telemedicine application and verified that I am speaking with the correct person using two identifiers. Location patient: home Location provider: work Persons participating in the virtual visit: patient, provider  I discussed the limitations, risks, security and privacy concerns of performing an evaluation and management service by video and the availability of in person appointments.  The patient expressed understanding and agreed to proceed.  Interactive audio and video telecommunications were attempted between this provider and patient and were successful initially. Due to increased static, we had to convert the visit to a telephone visit.   We continued and completed visit with audio only.  Pt was comfortable with this plan.    Reason for visit: acute visit.    HPI: Acute visit for sob, chest tightness and cough.  States symptoms started last week.  Increased head congestion and nasal congestion.  Symptoms have progressed.  She reports noticing chest tightness and some increased sob.  Feels her breathing is worse.  Some cough, productive of faint yellow sputum.  No fever, but she has been on prednisone for her back.  Still on prednisone. Started using her symbicort regularly over the last few days.  No increased acid reflux reported.  Just saw GI 11/2018.  Taking carafate and aciphex.  Taking tylenol.  States when she talks a lot, she feels a little light headed.     ROS:  See pertinent positives and negatives per HPI.  Past Medical History:  Diagnosis Date  . Anemia   . Anxiety   . Asthma   . Cataract cortical, senile   . Chronic headaches   . Depression   . Diastolic heart failure ([place])   . Diverticulosis   . Environmental allergies   . GERD (gastroesophageal reflux disease)   . Heart murmur   . Heart murmur   . Hypercholesterolemia   . Hyperglycemia   . Hyperlipidemia   . Hypertension   . Obesity   . Osteoarthritis   . Palpitations   . Restless leg syndrome   . Sleep apnea   . Thrombocytosis ([place])   . Urinary incontinence    mixed  . Urinary incontinence   . Venous insufficiency   . Vitamin D deficiency     Past Surgical History:  Procedure Laterality Date  . BLADDER SURGERY     x2   [facility]  . BREAST CYST ASPIRATION Bilateral 2005   approximate year  . CARDIAC CATHETERIZATION     Nehemiah Massed  . CERVICAL CONE BIOPSY     CIS  . CHOLECYSTECTOMY    . COLONOSCOPY WITH PROPOFOL N/A 07/19/2016   Procedure: COLONOSCOPY WITH PROPOFOL;  Surgeon: Manya Silvas, MD;  Location: [facility] ENDOSCOPY;  Service: Endoscopy;  Laterality: N/A;  . COLONOSCOPY WITH PROPOFOL N/A 10/20/2018   Procedure: COLONOSCOPY WITH PROPOFOL;  Surgeon: Lollie Sails, MD;  Location: [facility] ENDOSCOPY;  Service: Endoscopy;  Laterality: N/A;  . ESOPHAGOGASTRODUODENOSCOPY (EGD) WITH PROPOFOL N/A 07/19/2016  Procedure: ESOPHAGOGASTRODUODENOSCOPY (EGD) WITH PROPOFOL;  Surgeon: Manya Silvas, MD;  Location: [facility] ENDOSCOPY;  Service: Endoscopy;  Laterality: N/A;  . FLEXIBLE SIGMOIDOSCOPY    . KNEE ARTHROSCOPY  08/13/08  . knee replacement and revision     left  . RECTOCELE REPAIR    . RIGHT/LEFT HEART CATH AND CORONARY ANGIOGRAPHY Bilateral 04/10/2018   Procedure: RIGHT/LEFT HEART CATH AND CORONARY ANGIOGRAPHY;  Surgeon: Wellington Hampshire, MD;  Location: [place] CV LAB;  Service: Cardiovascular;  Laterality: Bilateral;  . ROTATOR CUFF REPAIR      bilateral  . SHOULDER SURGERY  11/17/05  . TONSILLECTOMY  1962  . VAGINAL HYSTERECTOMY  1974   abnormal pap and carcinoma in situ    Family History  Problem Relation Age of Onset  . Diabetes Mellitus II Father   . Thyroid disease Father   . Breast cancer Maternal Aunt   . Hypertension Mother   . Heart Problems Brother   . Leukemia Maternal Aunt   . Colon cancer Neg Hx   . Kidney cancer Neg Hx   . Bladder Cancer Neg Hx     SOCIAL HX: reviewed.    Current Outpatient Medications:  .  albuterol (PROVENTIL HFA;VENTOLIN HFA) 108 (90 BASE) MCG/ACT inhaler, Inhale 2 puffs into the lungs every 4 (four) hours as needed. (Patient taking differently: Inhale 2 puffs into the lungs every 4 (four) hours as needed for wheezing or shortness of breath. ), Disp: 1 Inhaler, Rfl: 3 .  albuterol (PROVENTIL) (2.5 MG/3ML) 0.083% nebulizer solution, Take 3 mLs (2.5 mg total) by nebulization every 6 (six) hours as needed for wheezing or shortness of breath., Disp: 150 mL, Rfl: 1 .  aspirin EC 81 MG tablet, Take 81 mg by mouth daily., Disp: , Rfl:  .  budesonide-formoterol (SYMBICORT) 80-4.5 MCG/ACT inhaler, Inhale 2 puffs into the lungs 2 (two) times daily. (Patient taking differently: Inhale 2 puffs into the lungs 2 (two) times daily as needed (short of breath). ), Disp: 1 Inhaler, Rfl: 3 .  Calcium Citrate-Vitamin D (CALCIUM CITRATE + D PO), Take 1 tablet by mouth daily. 500/400, Disp: , Rfl:  .  Cholecalciferol (VITAMIN D-3) 1000 units CAPS, Take 1 capsule by mouth daily. , Disp: , Rfl:  .  clobetasol ointment (TEMOVATE) 2.29 %, Apply 1 application topically 2 (two) times daily. , Disp: , Rfl: 0 .  linaclotide (LINZESS) 290 MCG CAPS capsule, Take 290 mcg by mouth daily before breakfast., Disp: , Rfl:  .  magnesium oxide (MAG-OX) 400 MG tablet, Take 400 mg daily by mouth., Disp: , Rfl:  .  metoprolol succinate (TOPROL-XL) 50 MG 24 hr tablet, TAKE 1 TABLET BY MOUTH  EVERY DAY OR IMMEDIATELY  FOLLOWING A MEAL,  Disp: 90 tablet, Rfl: 1 .  montelukast (SINGULAIR) 10 MG tablet, TAKE 1 TABLET BY MOUTH AT  BEDTIME, Disp: 90 tablet, Rfl: 1 .  Multiple Vitamin (MULTIVITAMIN) tablet, Take 1 tablet by mouth daily., Disp: , Rfl:  .  naproxen sodium (ALEVE) 220 MG tablet, Take 220-440 mg by mouth daily as needed (pain)., Disp: , Rfl:  .  NONFORMULARY OR COMPOUNDED ITEM, Place 1 spray into the nose 2 (two) times daily. Budesonide + Saline Irrigation/Rinse, Disp: , Rfl:  .  ondansetron (ZOFRAN) 4 MG tablet, Take 1 tablet (4 mg total) by mouth every 8 (eight) hours as needed for nausea or vomiting., Disp: 30 tablet, Rfl: 1 .  RABEprazole (ACIPHEX) 20 MG tablet, Take 20 mg by mouth 2 (two)  times daily., Disp: , Rfl:  .  rOPINIRole (REQUIP) 2 MG tablet, Take 2.5 mg by mouth daily. , Disp: , Rfl: 1 .  rosuvastatin (CRESTOR) 10 MG tablet, TAKE 1 TABLET BY MOUTH  DAILY, Disp: 90 tablet, Rfl: 1 .  sucralfate (CARAFATE) 1 g tablet, , Disp: , Rfl:  .  venlafaxine XR (EFFEXOR-XR) 150 MG 24 hr capsule, TAKE 1 CAPSULE BY MOUTH  DAILY WITH BREAKFAST, Disp: 90 capsule, Rfl: 0 .  vitamin B-12 (CYANOCOBALAMIN) 1000 MCG tablet, Take 1,000 mcg by mouth daily., Disp: , Rfl:   EXAM:  GENERAL: alert, oriented.  Appeared to be in no acute distress.    HEENT: atraumatic, conjunttiva clear, no obvious abnormalities on inspection of external nose and ears  NECK: normal movements of the head and neck  LUNGS: on inspection no signs of respiratory distress, breathing rate appears normal.  Increased cough with forced expiration.    CV: no obvious cyanosis  MS: moves all visible extremities without noticeable abnormality  PSYCH/NEURO: pleasant and cooperative, no obvious depression or anxiety, speech and thought processing grossly intact  ASSESSMENT AND PLAN:  Discussed the following assessment and plan:  Chest pain, unspecified type  Gastroesophageal reflux disease without esophagitis  SOB (shortness of breath)  Chest pain  Chest discomfort and tightness as outlined.  With increased sob and cough.  Discussed possible etiologies.  Some breathlessness during our conversation.  Has been seen and evaluated by cardiology previously.  W/up then unrevealing.  Discussed possible infectious etiology and the possibility of COVID.  Discussed the need for further evaluation and testing.  She agreed.  Given worsening symptoms and sob despite prednisone and her inhalers, will refer her to ER for further evaluation and treatment.  ER notifed.    GERD (gastroesophageal reflux disease) Has been evaluated recently and is followed by GI.  On aciphex and carafate recently added.  No increased symptoms reported.  Follow.    SOB (shortness of breath) Describes increased sob associated with increased cough and chest discomfort/tightness as outlined.  Discussed possible etiologies, including cardiac, infectious, etc.  Given worsening symptoms despite inhalers, prednisone, singulair, will refer to ER for further testing and evaluation.  Pt in agreement.  Instructed to wear a mask.  ER notified.      I discussed the assessment and treatment plan with the patient. The patient was provided an opportunity to ask questions and all were answered. The patient agreed with the plan and demonstrated an understanding of the instructions.   The patient was advised to call back or seek an in-person evaluation if the symptoms worsen or if the condition fails to improve as anticipated.  I provided 25 minutes of non-face-to-face time during this encounter.   Einar Pheasant, MD

## 2019-01-09 NOTE — Telephone Encounter (Signed)
Copied from [place] [phone] [phone]. Topic: Quick Communication - See Telephone Encounter >> Jan 09, 2019 11:50 AM Loma Boston wrote: CRM for notification. See Telephone encounter for: 01/09/19.PT has been sch for an appt at 3:30 and also been triaged. PT seems to have gotten another call from office  traced back to NT and the office. Leaving note to confirm that call was returned . Pt will keep appt at 3:30 with Scott. Will be available for any further calls if needed

## 2019-01-09 NOTE — ED Provider Notes (Signed)
[facility] Emergency Department Provider Note  Time seen: 5:32 PM  I have reviewed the triage vital signs and the nursing notes.   HISTORY  Chief Complaint Dyspnea   HPI Nicole Sims is a 68 y.o. female with a past medical history of anxiety, asthma, CHF, gastric reflux, hypertension, hyperlipidemia, presents to the emergency department for shortness of breath.  According to the patient over the past 5 days she has been feeling short of breath with a dry cough.  Denies any fever.  Patient states she was having some back pain last week and was started on prednisone taper currently down to 20 mg/day.  Has been using her albuterol inhaler at home with minimal relief of her shortness of breath.  Patient was sent by her doctor for further evaluation.  Denies any chest pain.  Largely negative review of systems otherwise.  Patient is slightly tachypneic around 20 breaths/min however has a 100% room air saturation, reassuring vitals and pulse rate.  Past Medical History:  Diagnosis Date  . Anemia   . Anxiety   . Asthma   . Cataract cortical, senile   . Chronic headaches   . Depression   . Diastolic heart failure ([place])   . Diverticulosis   . Environmental allergies   . GERD (gastroesophageal reflux disease)   . Heart murmur   . Heart murmur   . Hypercholesterolemia   . Hyperglycemia   . Hyperlipidemia   . Hypertension   . Obesity   . Osteoarthritis   . Palpitations   . Restless leg syndrome   . Sleep apnea   . Thrombocytosis ([place])   . Urinary incontinence    mixed  . Urinary incontinence   . Venous insufficiency   . Vitamin D deficiency     Patient Active Problem List   Diagnosis Date Noted  . Iron deficiency anemia 10/12/2018  . Sleep apnea 04/30/2018  . Diastolic heart failure ([place]) 04/10/2018  . SOB (shortness of breath)   . Vitamin D deficiency 04/02/2017  . Leg pain, bilateral 01/10/2017  . Dry eyes 10/24/2016  . Blurred vision 06/07/2016   . Nasal congestion 10/21/2015  . Abdominal pain in female 10/20/2015  . Cough 08/25/2015  . Lichen sclerosus 58/04/9832  . Chest pain 12/17/2014  . Health care maintenance 12/17/2014  . Stress 02/24/2014  . Sinusitis 02/24/2014  . Tachycardia 09/30/2013  . Palpitations 08/21/2012  . Osteoarthritis 08/21/2012  . Hypercholesterolemia 08/21/2012  . GERD (gastroesophageal reflux disease) 08/21/2012  . Thrombocytosis ([place]) 08/21/2012  . Anemia 08/21/2012  . Hypertension 08/21/2012  . Chronic headaches 08/21/2012  . Hyperglycemia 08/21/2012    Past Surgical History:  Procedure Laterality Date  . BLADDER SURGERY     x2   [place] [facility] [place]  . BREAST CYST ASPIRATION Bilateral 2005   approximate year  . CARDIAC CATHETERIZATION     Nehemiah Massed  . CERVICAL CONE BIOPSY     CIS  . CHOLECYSTECTOMY    . COLONOSCOPY WITH PROPOFOL N/A 07/19/2016   Procedure: COLONOSCOPY WITH PROPOFOL;  Surgeon: Manya Silvas, MD;  Location: [facility] ENDOSCOPY;  Service: Endoscopy;  Laterality: N/A;  . COLONOSCOPY WITH PROPOFOL N/A 10/20/2018   Procedure: COLONOSCOPY WITH PROPOFOL;  Surgeon: Lollie Sails, MD;  Location: [facility] ENDOSCOPY;  Service: Endoscopy;  Laterality: N/A;  . ESOPHAGOGASTRODUODENOSCOPY (EGD) WITH PROPOFOL N/A 07/19/2016   Procedure: ESOPHAGOGASTRODUODENOSCOPY (EGD) WITH PROPOFOL;  Surgeon: Manya Silvas, MD;  Location: [facility] ENDOSCOPY;  Service: Endoscopy;  Laterality: N/A;  .  FLEXIBLE SIGMOIDOSCOPY    . KNEE ARTHROSCOPY  08/13/08  . knee replacement and revision     left  . RECTOCELE REPAIR    . RIGHT/LEFT HEART CATH AND CORONARY ANGIOGRAPHY Bilateral 04/10/2018   Procedure: RIGHT/LEFT HEART CATH AND CORONARY ANGIOGRAPHY;  Surgeon: Wellington Hampshire, MD;  Location: [place] CV LAB;  Service: Cardiovascular;  Laterality: Bilateral;  . ROTATOR CUFF REPAIR     bilateral  . SHOULDER SURGERY  11/17/05  . TONSILLECTOMY  1962  . VAGINAL HYSTERECTOMY  1974   abnormal pap and  carcinoma in situ    Prior to Admission medications   Medication Sig Start Date End Date Taking? Authorizing Provider  albuterol (PROVENTIL HFA;VENTOLIN HFA) 108 (90 BASE) MCG/ACT inhaler Inhale 2 puffs into the lungs every 4 (four) hours as needed. Patient taking differently: Inhale 2 puffs into the lungs every 4 (four) hours as needed for wheezing or shortness of breath.  02/24/14   Einar Pheasant, MD  albuterol (PROVENTIL) (2.5 MG/3ML) 0.083% nebulizer solution Take 3 mLs (2.5 mg total) by nebulization every 6 (six) hours as needed for wheezing or shortness of breath. 01/11/17   Leone Haven, MD  aspirin EC 81 MG tablet Take 81 mg by mouth daily.    [provider]  budesonide-formoterol (SYMBICORT) 80-4.5 MCG/ACT inhaler Inhale 2 puffs into the lungs 2 (two) times daily. Patient taking differently: Inhale 2 puffs into the lungs 2 (two) times daily as needed (short of breath).  08/04/17   Parrett, Fonnie Mu, NP  Calcium Citrate-Vitamin D (CALCIUM CITRATE + D PO) Take 1 tablet by mouth daily. 500/400    [provider]  Cholecalciferol (VITAMIN D-3) 1000 units CAPS Take 1 capsule by mouth daily.     [provider]  clobetasol ointment (TEMOVATE) 6.56 % Apply 1 application topically 2 (two) times daily.  11/14/17   [provider]  linaclotide (LINZESS) 290 MCG CAPS capsule Take 290 mcg by mouth daily before breakfast.    [provider]  magnesium oxide (MAG-OX) 400 MG tablet Take 400 mg daily by mouth.    [provider]  metoprolol succinate (TOPROL-XL) 50 MG 24 hr tablet TAKE 1 TABLET BY MOUTH  EVERY DAY OR IMMEDIATELY  FOLLOWING A MEAL 01/09/19   Einar Pheasant, MD  montelukast (SINGULAIR) 10 MG tablet TAKE 1 TABLET BY MOUTH AT  BEDTIME 10/17/18   Einar Pheasant, MD  Multiple Vitamin (MULTIVITAMIN) tablet Take 1 tablet by mouth daily.    [provider]  naproxen sodium (ALEVE) 220 MG tablet Take 220-440 mg by mouth daily as  needed (pain).    [provider]  NONFORMULARY OR COMPOUNDED ITEM Place 1 spray into the nose 2 (two) times daily. Budesonide + Saline Irrigation/Rinse    [provider]  ondansetron (ZOFRAN) 4 MG tablet Take 1 tablet (4 mg total) by mouth every 8 (eight) hours as needed for nausea or vomiting. 08/18/18   McLean-Scocuzza, Nino Glow, MD  RABEprazole (ACIPHEX) 20 MG tablet Take 20 mg by mouth 2 (two) times daily.    [provider]  rOPINIRole (REQUIP) 2 MG tablet Take 2.5 mg by mouth daily.  10/20/17   [provider]  rosuvastatin (CRESTOR) 10 MG tablet TAKE 1 TABLET BY MOUTH  DAILY 01/09/19   Einar Pheasant, MD  sucralfate (CARAFATE) 1 g tablet  11/28/18   [provider]  venlafaxine XR (EFFEXOR-XR) 150 MG 24 hr capsule TAKE 1 CAPSULE BY MOUTH  DAILY WITH  BREAKFAST 01/09/19   Einar Pheasant, MD  vitamin B-12 (CYANOCOBALAMIN) 1000 MCG tablet Take 1,000 mcg by mouth daily.    [provider]    Allergies  Allergen Reactions  . Augmentin [Amoxicillin-Pot Clavulanate] Other (See Comments)    Questionable itching  . Bactrim [Sulfamethoxazole-Trimethoprim]   . Doxycycline Itching  . Hydrocodone-Acetaminophen Other (See Comments)    GI distress  . Levaquin [Levofloxacin]   . Pseudoephedrine     Itching of the scalp  . Relafen [Nabumetone] Other (See Comments)    Itching/rash  . Shellfish Allergy     Nausea and vomiting     Family History  Problem Relation Age of Onset  . Diabetes Mellitus II Father   . Thyroid disease Father   . Breast cancer Maternal Aunt   . Hypertension Mother   . Heart Problems Brother   . Leukemia Maternal Aunt   . Colon cancer Neg Hx   . Kidney cancer Neg Hx   . Bladder Cancer Neg Hx     Social History Social History   Tobacco Use  . Smoking status: Former Smoker    Last attempt to quit: 08/17/1983    Years since quitting: 35.4  . Smokeless tobacco: Never Used  Substance Use Topics  . Alcohol use: Yes     Alcohol/week: 0.0 standard drinks    Comment: rarely  . Drug use: No    Review of Systems Constitutional: Negative for fever. ENT: Negative for recent illness/congestion Cardiovascular: Negative for chest pain. Respiratory: Positive for shortness of breath x5 days. Gastrointestinal: Negative for abdominal pain, vomiting  Musculoskeletal: Negative for musculoskeletal complaints Skin: Negative for skin complaints  Neurological: Negative for headache All other ROS negative  ____________________________________________   PHYSICAL EXAM:  VITAL SIGNS: ED Triage Vitals  Enc Vitals Group     BP 01/09/19 1658 (!) 147/70     Pulse Rate 01/09/19 1658 78     Resp 01/09/19 1658 (!) 22     Temp 01/09/19 1658 97.8 F (36.6 C)     Temp Source 01/09/19 1658 Oral     SpO2 01/09/19 1658 100 %     Weight --      Height --      Head Circumference --      Peak Flow --      Pain Score 01/09/19 1700 4     Pain Loc --      Pain Edu? --      Excl. in [place]? --    Constitutional: Alert and oriented. Well appearing and in no distress. Eyes: Normal exam ENT      Head: Normocephalic and atraumatic.      Mouth/Throat: Mucous membranes are moist. Cardiovascular: Normal rate, regular rhythm.  Respiratory: Normal respiratory effort without tachypnea nor retractions. Breath sounds are clear.  No wheeze rales rhonchi. Gastrointestinal: Soft and nontender. No distention.   Musculoskeletal: Nontender with normal range of motion in all extremities. No lower extremity tenderness Neurologic:  Normal speech and language. No gross focal neurologic deficits Skin:  Skin is warm, dry and intact.  Psychiatric: Mood and affect are normal.  ____________________________________________    EKG  EKG viewed and interpreted by myself shows a normal sinus rhythm at 78 bpm with a narrow QRS, normal axis, normal intervals, no ST changes.  ____________________________________________    RADIOLOGY  Chest  x-ray shows no acute abnormality.  ____________________________________________   INITIAL IMPRESSION / ASSESSMENT AND PLAN / ED COURSE  Pertinent labs & imaging results  that were available during my care of the patient were reviewed by me and considered in my medical decision making (see chart for details).   Patient presents emergency department for shortness of breath ongoing over the past 5 days.  No chest pain.  Patient also states a dry cough but denies any fever.  Differential this time would include pneumonia, asthma exacerbation, viral process/upper respiratory infection, coronavirus, ACS.  We will check labs, chest x-ray, corona test and continue to closely monitor.  Reassuringly patient's vitals are largely normal besides very slight tachypnea around 20 breaths/min.  Patient is in no distress, does appear mildly anxious.  Patient's labs show leukocytosis otherwise normal.  Troponin negative.  Corona test is negative.  Overall patient's work-up is reassuring.  We will discharge the patient home with PCP follow-up.  Patient is already taking prednisone which may be contributing somewhat to her anxiety.  Patient will follow-up with her doctor for recheck.  Vanessa Kampf Forst was evaluated in Emergency Department on 01/09/2019 for the symptoms described in the history of present illness. She was evaluated in the context of the global COVID-19 pandemic, which necessitated consideration that the patient might be at risk for infection with the SARS-CoV-2 virus that causes COVID-19. Institutional protocols and algorithms that pertain to the evaluation of patients at risk for COVID-19 are in a state of rapid change based on information released by regulatory bodies including the CDC and federal and state organizations. These policies and algorithms were followed during the patient's care in the ED.  ____________________________________________   FINAL CLINICAL IMPRESSION(S) / ED DIAGNOSES  Dyspnea    Harvest Dark, MD 01/09/19 1914

## 2019-01-09 NOTE — ED Notes (Signed)
MD at bedside. 

## 2019-01-09 NOTE — Telephone Encounter (Signed)
Noted  

## 2019-01-09 NOTE — Telephone Encounter (Signed)
Pt called with having some shortness of breath. She uses medications for asthma. But does not think it is helping much. She is also some other symptoms, such as runny nose, dizziness and cough. No fever. She wears a tracker and her heart rate is 70 Advised of having virtual appointment, pt voiced understanding.  Also advised that if she starts having respiratory, she needs to call 911. She voiced understanding. Attempted to notified LB at [facility], no answer. Routing to office for review and recommendation for an appointment. Advised pt that she should receive a call back from the office.  Reason for Disposition . [1] MILD difficulty breathing (e.g., minimal/no SOB at rest, SOB with walking, pulse <100) AND [2] NEW-onset or WORSE than normal  Answer Assessment - Initial Assessment Questions 1. RESPIRATORY STATUS: "Describe your breathing?" (e.g., wheezing, shortness of breath, unable to speak, severe coughing)      Shortness of breath 2. ONSET: "When did this breathing problem begin?"      Over the weekend 3. PATTERN "Does the difficult breathing come and go, or has it been constant since it started?"      constant 4. SEVERITY: "How bad is your breathing?" (e.g., mild, moderate, severe)    - MILD: No SOB at rest, mild SOB with walking, speaks normally in sentences, can lay down, no retractions, pulse < 100.    - MODERATE: SOB at rest, SOB with minimal exertion and prefers to sit, cannot lie down flat, speaks in phrases, mild retractions, audible wheezing, pulse 100-120.    - SEVERE: Very SOB at rest, speaks in single words, struggling to breathe, sitting hunched forward, retractions, pulse > 120      moderate 5. RECURRENT SYMPTOM: "Have you had difficulty breathing before?" If so, ask: "When was the last time?" and "What happened that time?"      Yes and was diagnosed with bronchitis 6. CARDIAC HISTORY: "Do you have any history of heart disease?" (e.g., heart attack, angina, bypass  surgery, angioplasty)      ? Mild systolic heart failure 7. LUNG HISTORY: "Do you have any history of lung disease?"  (e.g., pulmonary embolus, asthma, emphysema)     Asthma? Using medication for it 8. CAUSE: "What do you think is causing the breathing problem?"      Not sure 9. OTHER SYMPTOMS: "Do you have any other symptoms? (e.g., dizziness, runny nose, cough, chest pain, fever)     Runny nose, some dizziness, cough 10. PREGNANCY: "Is there any chance you are pregnant?" "When was your last menstrual period?"       No 11. TRAVEL: "Have you traveled out of the country in the last month?" (e.g., travel history, exposures)       No exposures that she is aware  Protocols used: BREATHING DIFFICULTY-A-AH

## 2019-01-10 ENCOUNTER — Inpatient Hospital Stay: Payer: Medicare Other

## 2019-01-12 ENCOUNTER — Inpatient Hospital Stay: Payer: Medicare Other

## 2019-01-12 ENCOUNTER — Inpatient Hospital Stay: Payer: Medicare Other | Admitting: Oncology

## 2019-01-15 ENCOUNTER — Telehealth: Payer: Self-pay | Admitting: Cardiovascular Disease

## 2019-01-15 ENCOUNTER — Encounter: Payer: Self-pay | Admitting: Physician Assistant

## 2019-01-15 NOTE — Telephone Encounter (Signed)
Patient states she has been having SOB for about a week. Pt c/o Shortness Of Breath: STAT if SOB developed within the last 24 hours or pt is noticeably SOB on the phone  1. Are you currently SOB (can you hear that pt is SOB on the phone)? yes  2. How long have you been experiencing SOB?  About 2 weeks, Had a virtual appt with PCP. Did Covid testing and was negative  3. Are you SOB when sitting or when up moving around? All the time  4. Are you currently experiencing any other symptoms? Cough, chest xray was performed at [facility]

## 2019-01-15 NOTE — Progress Notes (Signed)
Cardiology Office Note Date:  01/16/2019  Patient ID:  Nicole Sims, DOB [birth year], MRN [id number] PCP:  Nicole Pheasant, MD  Cardiologist:  Dr. Fletcher Anon, MD     Chief Complaint: SOB  History of Present Illness: Nicole Sims is a 68 y.o. female with history of normal coronary arteries by LHC on 04/10/2018, mild HFpEF, obesity, OSA on CPAP, thrombocytosis, HTN, HLD, RLS, chronic headaches, depression, and GERD who presents for evaluation of SOB.   She was previously followed by Dr. Nehemiah Massed for exertional dyspnea, fatigue, and palpitations. She underwent an echo and treadmill Myoview that were both unremarkable. 24-hour Holter showed no significant arrhythmia. More recently, she established with Dr. Fletcher Nicole Sims and given persistent symptoms, she underwent R/LHC on 04/10/2018 that showed normal coronary arteries with RHC showing mildly elevated filling pressures with a wedge pressure of 13 mmHg, high normal pulmonary pressure and normal cardiac output. She was last seen by Dr. Fletcher Nicole Sims in 04/2018 and continued to report SOB with recent sinus congestion and overall fatigue, as well as right sided back discomfort. Labs at that time showed a mildly elevated D-dimer and normal BNP. CTA of the chest in 04/2018 showed no evidence of PE, thoracic aortic aneurysm or dissection with noted aortic atherosclerosis. Small nodular lesions in the right lung with the largest measuring 5 mm were noted.   She was seen in telehealth on 5/26 by her PCP with SOB, chest tightness, and cough that had been present for ~ 1 week. There was also associates nasal congestion and head congestion. Cough was productive of faint yellow sputum. She was noted to have been on prednisone for back pain. She had recently started using Symbicort. She was referred to the ED with work up showing troponin negative x 1, COVID-19 negative, WBC 18.7, HGB 13.4, PLT 490, K+ 3.5, SCr 0.98, CXR without active process with aortic atherosclerosis and thoracic  spondylosis noted. EKG showed NSR, 78 bpm, no acute st/t changes. She was advised to follow up with her PCP. She called our office this morning wanting an appointment for SOB.   She comes in today noting a 2 week history of increased SOB. She states at baseline she has some degree of SOB, though more recently this has been worse. Her breathing typically worsens in the warmer months and has done so since she was a child. She has also noted needing 2 pillows when laying down to sleep now, which has coincided with her increased SOB. There has also been some chest tightness underneath her breasts that is not exertional. Nodizziness, presyncope, or syncope. She has continued to note intermittent palpitations which are unchanged over the years and do not appear to be associated with her SOB. She denies any lower extremity swelling or weight gain. Her weight is down 5 pounds compared to her last visit with [place] in 04/2018. She is still able to use her push mower to cut the grass without chest pain, however does note SOB with a reported heart rate into the 180s bpm without associated palpitations. No recent prolonged sedentary periods, vascular trauma, or hypercoagulable state. Prior CTA chest for similar symptoms was negative for PE as above with noted pulmonary nodules as above.   Labs: 12/2018 - LDL 113, A1c6.1, LFT normal, K+ 3.5, SCr 0.98, WBC 18.7, HGB 13.4, PLT 490    Past Medical History:  Diagnosis Date   Anemia    Anxiety    Asthma    Cataract cortical, senile    Chronic headaches  Depression    Diastolic heart failure (HCC)    Diverticulosis    Environmental allergies    GERD (gastroesophageal reflux disease)    Heart murmur    Hyperglycemia    Hyperlipidemia    Hypertension    Obesity    Osteoarthritis    Palpitations    Restless leg syndrome    Sleep apnea    Thrombocytosis (HCC)    Urinary incontinence    mixed   Venous insufficiency    Vitamin D  deficiency     Past Surgical History:  Procedure Laterality Date   BLADDER SURGERY     x2   [facility]   BREAST CYST ASPIRATION Bilateral 2005   approximate year   CARDIAC CATHETERIZATION     Nehemiah Massed   CERVICAL CONE BIOPSY     CIS   CHOLECYSTECTOMY     COLONOSCOPY WITH PROPOFOL N/A 07/19/2016   Procedure: COLONOSCOPY WITH PROPOFOL;  Surgeon: Manya Silvas, MD;  Location: [facility];  Service: Endoscopy;  Laterality: N/A;   COLONOSCOPY WITH PROPOFOL N/A 10/20/2018   Procedure: COLONOSCOPY WITH PROPOFOL;  Surgeon: Lollie Sails, MD;  Location: [facility] ENDOSCOPY;  Service: Endoscopy;  Laterality: N/A;   ESOPHAGOGASTRODUODENOSCOPY (EGD) WITH PROPOFOL N/A 07/19/2016   Procedure: ESOPHAGOGASTRODUODENOSCOPY (EGD) WITH PROPOFOL;  Surgeon: Manya Silvas, MD;  Location: [facility] ENDOSCOPY;  Service: Endoscopy;  Laterality: N/A;   FLEXIBLE SIGMOIDOSCOPY     KNEE ARTHROSCOPY  08/13/08   knee replacement and revision     left   RECTOCELE REPAIR     RIGHT/LEFT HEART CATH AND CORONARY ANGIOGRAPHY Bilateral 04/10/2018   Procedure: RIGHT/LEFT HEART CATH AND CORONARY ANGIOGRAPHY;  Surgeon: Wellington Hampshire, MD;  Location: [facility];  Service: Cardiovascular;  Laterality: Bilateral;   ROTATOR CUFF REPAIR     bilateral   SHOULDER SURGERY  11/17/05   TONSILLECTOMY  1962   VAGINAL HYSTERECTOMY  1974   abnormal pap and carcinoma in situ    Current Meds  Medication Sig   albuterol (PROVENTIL HFA;VENTOLIN HFA) 108 (90 BASE) MCG/ACT inhaler Inhale 2 puffs into the lungs every 4 (four) hours as needed. (Patient taking differently: Inhale 2 puffs into the lungs every 4 (four) hours as needed for wheezing or shortness of breath. )   albuterol (PROVENTIL) (2.5 MG/3ML) 0.083% nebulizer solution Take 3 mLs (2.5 mg total) by nebulization every 6 (six) hours as needed for wheezing or shortness of breath.   aspirin EC 81 MG tablet Take 81 mg by mouth daily.    Calcium Citrate-Vitamin D (CALCIUM CITRATE + D PO) Take 1 tablet by mouth daily. 500/400   Cholecalciferol (VITAMIN D-3) 1000 units CAPS Take 1 capsule by mouth daily.    clobetasol ointment (TEMOVATE) 0.94 % Apply 1 application topically 2 (two) times daily.    linaclotide (LINZESS) 290 MCG CAPS capsule Take 290 mcg by mouth daily as needed.    magnesium oxide (MAG-OX) 400 MG tablet Take 400 mg daily by mouth.   metoprolol succinate (TOPROL-XL) 50 MG 24 hr tablet TAKE 1 TABLET BY MOUTH  EVERY DAY OR IMMEDIATELY  FOLLOWING A MEAL (Patient taking differently: Take 25 mg by mouth daily. TAKE 1 TABLET BY MOUTH EVERY DAY OR IMMEDIATELY FOLLOWING A MEAL)   montelukast (SINGULAIR) 10 MG tablet TAKE 1 TABLET BY MOUTH AT  BEDTIME   Multiple Vitamin (MULTIVITAMIN) tablet Take 1 tablet by mouth daily.   naproxen sodium (ALEVE) 220 MG tablet Take 220-440 mg by mouth daily  as needed (pain).   NONFORMULARY OR COMPOUNDED ITEM Place 1 spray into the nose 2 (two) times daily. Budesonide + Saline Irrigation/Rinse   ondansetron (ZOFRAN) 4 MG tablet Take 1 tablet (4 mg total) by mouth every 8 (eight) hours as needed for nausea or vomiting.   RABEprazole (ACIPHEX) 20 MG tablet Take 20 mg by mouth 2 (two) times daily.   rOPINIRole (REQUIP) 2 MG tablet Take 2 mg by mouth daily.    rosuvastatin (CRESTOR) 10 MG tablet TAKE 1 TABLET BY MOUTH  DAILY   sucralfate (CARAFATE) 1 g tablet Take 1 g by mouth daily.    venlafaxine XR (EFFEXOR-XR) 150 MG 24 hr capsule TAKE 1 CAPSULE BY MOUTH  DAILY WITH BREAKFAST   vitamin B-12 (CYANOCOBALAMIN) 1000 MCG tablet Take 1,000 mcg by mouth daily.    Allergies:   Augmentin [amoxicillin-pot clavulanate]; Bactrim [sulfamethoxazole-trimethoprim]; Doxycycline; Hydrocodone-acetaminophen; Levaquin [levofloxacin]; Pseudoephedrine; Relafen [nabumetone]; and Shellfish allergy   Social History:  The patient  reports that she quit smoking about 35 years ago. She has never used  smokeless tobacco. She reports current alcohol use. She reports that she does not use drugs.   Family History:  The patient's family history includes Breast cancer in her maternal aunt; Diabetes Mellitus II in her father; Heart Problems in her brother; Hypertension in her mother; Leukemia in her maternal aunt; Thyroid disease in her father.  ROS:   Review of Systems  Constitutional: Positive for malaise/fatigue. Negative for chills, diaphoresis, fever and weight loss.  HENT: Negative for congestion.   Eyes: Negative for discharge and redness.  Respiratory: Positive for cough and shortness of breath. Negative for hemoptysis, sputum production and wheezing.   Cardiovascular: Positive for orthopnea. Negative for chest pain, palpitations, claudication, leg swelling and PND.       2-pillow orthopnea   Gastrointestinal: Negative for abdominal pain, blood in stool, heartburn, melena, nausea and vomiting.  Genitourinary: Negative for hematuria.  Musculoskeletal: Negative for falls and myalgias.  Skin: Negative for rash.  Neurological: Positive for weakness. Negative for dizziness, tingling, tremors, sensory change, speech change, focal weakness and loss of consciousness.  Endo/Heme/Allergies: Does not bruise/bleed easily.  Psychiatric/Behavioral: Negative for substance abuse. The patient is not nervous/anxious.   All other systems reviewed and are negative.    PHYSICAL EXAM:  VS:  BP (!) 142/82 (BP Location: Left Arm, Patient Position: Sitting, Cuff Size: Large)    Pulse 78    Ht 5\' 5"  (1.651 m)    Wt 229 lb 8 oz (104.1 kg)    SpO2 97% Comment: room air   BMI 38.19 kg/m  BMI: Body mass index is 38.19 kg/m.  Physical Exam  Constitutional: She is oriented to person, place, and time. She appears well-developed and well-nourished.  HENT:  Head: Normocephalic and atraumatic.  Eyes: Right eye exhibits no discharge. Left eye exhibits no discharge.  Neck: Normal range of motion. No JVD present.    Cardiovascular: Normal rate, regular rhythm, S1 normal, S2 normal and normal heart sounds. Exam reveals no distant heart sounds, no friction rub, no midsystolic click and no opening snap.  No murmur heard. Pulses:      Posterior tibial pulses are 2+ on the right side and 2+ on the left side.  Pulmonary/Chest: Effort normal and breath sounds normal. No respiratory distress. She has no decreased breath sounds. She has no wheezes. She has no rales. She exhibits no tenderness.  Abdominal: Soft. She exhibits no distension. There is no abdominal tenderness.  Musculoskeletal:  General: No edema.  Neurological: She is alert and oriented to person, place, and time.  Skin: Skin is warm and dry. No cyanosis. Nails show no clubbing.  Psychiatric: She has a normal mood and affect. Her speech is normal and behavior is normal. Judgment and thought content normal.     EKG:  Was ordered and interpreted by me today. Shows NSR, 78 bpm, no acute st/t changes   Recent Labs: 05/09/2018: B Natriuretic Peptide 43.0 08/25/2018: TSH 2.48 01/03/2019: ALT 13 01/09/2019: BUN 17; Creatinine, Ser 0.98; Hemoglobin 13.4; Platelets 490; Potassium 3.5; Sodium 138  01/03/2019: Cholesterol 202; HDL 66.40; LDL Cholesterol 113; Total CHOL/HDL Ratio 3; Triglycerides 112.0; VLDL 22.4   Estimated Creatinine Clearance: 65.7 mL/min (by C-G formula based on SCr of 0.98 mg/dL).   Wt Readings from Last 3 Encounters:  01/16/19 229 lb 8 oz (104.1 kg)  01/09/19 225 lb 1.4 oz (102.1 kg)  10/20/18 225 lb (102.1 kg)     Other studies reviewed: Additional studies/records reviewed today include: summarized above  ASSESSMENT AND PLAN:  1. Dyspnea: She has a long history of intermittent dyspnea with prior cardiac workup, including echo, Myoview, cardiac monitoring, and R/LHC, being most unrevealing outside of mild HFpEF. She also has known pulmonary nodules with a prior smoking history. Prior CTA of the chest was negative for PE. Her  O2 saturation is 97% on room air in the office today. Check BNP to trend. Schedule echo to reevaluate EF, Blodgett motion, valvular function, RV cavity size, and PASP. If she is found to have a dilated RV would recommend VQ scan to evaluate for chronic PE. Follow up of her pulmonary nodules should be undertaken per radiology recommendations given her tobacco use and symptoms, I will defer this to her PCP. Consider pulmonology referral given her long history of worsening SOB in the warmer months. Cannot exclude component of obesity and physical deconditioning. Recent CBC showed    2. HFpEF: I cannot exclude the possibility of mild volume overload given she presented with similar symptoms last year and was noted to possibly be mildly volume up. Check BNP today. Start Lasix 20 mg daily. Check BMP in 3 days. Check echo as above. Optimize BP.   3. Pulmonary nodules: Cannot exclude pulmonary etiology of her dyspnea. Given her prior tobacco use, would recommend follow up imaging/referral to pulmonology if PCP feels inclined.    4. Normal coronary arteries: No angina. Cannot exclude mild volume overload with her chest tightness as above. No plans for ischemic evaluation at this time. Continue AS, Toprol XL, and Crestor. Aggressive risk factor modification.   5. Palpitations: Unchanged over the years. Schedule Zio. Recent TSH normal. Potassium 3.5. Continue Toprol XL.  6. HTN: Hopefully, with gentle diuresis, her BP will improve. If not, she will need titration of antihypertensive therapy.    Disposition: F/u with Dr. Fletcher Nicole Sims or an APP in 6-8 weeks.  Current medicines are reviewed at length with the patient today.  The patient did not have any concerns regarding medicines.  Signed, Christell Faith, PA-C 01/16/2019 2:45 PM     [place] [address] [place] [address] [place], [place] [address] [phone]

## 2019-01-15 NOTE — Telephone Encounter (Signed)
Please schedule her for 2 PM on Tuesday, 6/2.

## 2019-01-15 NOTE — Telephone Encounter (Signed)
Spoke with the pt and adv her that Thurmond Butts can see her for an in office visit tomorrow 6/2 @ 2pm. Adv the pt that she will need to enter at the [facility] medical mall entrance and she will then be escorted to our office. Adv the pt to allot enough time to ger to her appt on time. Adv the pt to bring a face mask for home if possible. Pt verbalized understanding and voiced appreciation.  COVID -19 pre-screening questionnaire completed.        COVID-19 Pre-Screening Questions:  . In the past 7 to 10 days have you had a cough,  shortness of breath, headache, congestion, fever (100 or greater) body aches, chills, sore throat, or sudden loss of taste or sense of smell? No . Have you been around anyone with known Covid 19.No . Have you been around anyone who is awaiting Covid 19 test results in the past 7 to 10 days. Pt tested negative on 5/26 . Have you been around anyone who has been exposed to Covid 19, or has mentioned symptoms of Covid 19 within the past 7 to 10 days? No

## 2019-01-15 NOTE — Telephone Encounter (Signed)
Spoke with the pt. Pt sts that she had a virtual visit with her pcp last week for sob. She was instructed to go tot the ED for evaluation. Pt sts that she was tested for COVID, has a cxr and labs. Testing was unremarkable and she was d/c home. Pt sts the sob of breath has been persistent even at rest.. She denies palpitations, chest pain, orthopnea, PND,She sleeps on 2 pillow at night, she denies swelling or weight gain, in fact she has lost 6lbs on her home scales. Pt sound sob on the phone, she is able to complete full sentences.    Pt was seen by Dr. Fletcher Anon in Sept 2019 for sob. Pt is requesting to be seen asap by Dr. Fletcher Anon, but does not want a virtual visit. Adv the pt that Dr. Tyrell Antonio first available would be 1-2 weeks. She is agreeable with seeing a APP. Adv the pt tha due to COVID-19 we are limiting the amount of patient's seen in the office. I will need to fwd a message to our PA for review and advise on an in office appt and call back to discuss. Pt agreeable with plan.

## 2019-01-16 ENCOUNTER — Telehealth: Payer: Self-pay

## 2019-01-16 ENCOUNTER — Other Ambulatory Visit: Payer: Self-pay

## 2019-01-16 ENCOUNTER — Ambulatory Visit: Payer: Medicare Other | Admitting: Physician Assistant

## 2019-01-16 ENCOUNTER — Encounter: Payer: Self-pay | Admitting: Internal Medicine

## 2019-01-16 ENCOUNTER — Encounter: Payer: Self-pay | Admitting: Physician Assistant

## 2019-01-16 ENCOUNTER — Other Ambulatory Visit
Admission: RE | Admit: 2019-01-16 | Discharge: 2019-01-16 | Disposition: A | Discharge status: Home / Self Care | Payer: Medicare Other | Source: Ambulatory Visit | Attending: Physician Assistant | Admitting: Physician Assistant

## 2019-01-16 VITALS — BP 142/82 | HR 78 | Ht 65.0 in | Wt 229.5 lb

## 2019-01-16 DIAGNOSIS — IMO0001 Reserved for inherently not codable concepts without codable children: Secondary | ICD-10-CM

## 2019-01-16 DIAGNOSIS — Z0389 Encounter for observation for other suspected diseases and conditions ruled out: Secondary | ICD-10-CM

## 2019-01-16 DIAGNOSIS — R0602 Shortness of breath: Secondary | ICD-10-CM

## 2019-01-16 DIAGNOSIS — I5032 Chronic diastolic (congestive) heart failure: Secondary | ICD-10-CM

## 2019-01-16 DIAGNOSIS — R002 Palpitations: Secondary | ICD-10-CM | POA: Diagnosis not present

## 2019-01-16 DIAGNOSIS — I10 Essential (primary) hypertension: Secondary | ICD-10-CM

## 2019-01-16 DIAGNOSIS — R918 Other nonspecific abnormal finding of lung field: Secondary | ICD-10-CM | POA: Diagnosis not present

## 2019-01-16 LAB — BRAIN NATRIURETIC PEPTIDE: B Natriuretic Peptide: 24 pg/mL (ref 0.0–100.0)

## 2019-01-16 LAB — BASIC METABOLIC PANEL
Anion gap: 8 (ref 5–15)
BUN: 15 mg/dL (ref 8–23)
CO2: 23 mmol/L (ref 22–32)
Calcium: 8.8 mg/dL — ABNORMAL LOW (ref 8.9–10.3)
Chloride: 106 mmol/L (ref 98–111)
Creatinine, Ser: 0.95 mg/dL (ref 0.44–1.00)
GFR calc Af Amer: >60 mL/min (ref >60)
GFR calc non Af Amer: >60 mL/min (ref >60)
Glucose, Bld: 113 mg/dL — ABNORMAL HIGH (ref 70–99)
Potassium: 3.7 mmol/L (ref 3.5–5.1)
Sodium: 137 mmol/L (ref 135–145)

## 2019-01-16 MED ORDER — FUROSEMIDE 20 MG PO TABS: 20.0000 mg | ORAL_TABLET | Freq: Every day | ORAL | 3 refills | Status: DC

## 2019-01-16 NOTE — Patient Instructions (Signed)
Medication Instructions:  Your physician has recommended you make the following change in your medication:  1- START Lasix Take 1 tablet (20 mg total) by mouth daily If you need a refill on your cardiac medications before your next appointment, please call your pharmacy.   Lab work: 1- Your physician recommends that you return for lab work today (BNP) at the medical mall. No appt is needed. Hours are M-F 7AM- 6 PM. 2- Your physician recommends that you return Friday (BMET) at the medical mall. No appt is needed. Hours are M-F 7AM- 6 PM.   If you have labs (blood work) drawn today and your tests are completely normal, you will receive your results only by: Marland Kitchen MyChart Message (if you have MyChart) OR . A paper copy in the mail If you have any lab test that is abnormal or we need to change your treatment, we will call you to review the results.  Testing/Procedures: 1-  Zio XT: We will place order and you will receive it in the mail.  You may get a call from [place] @ either  [phone] Or  [phone] for them to confirm your address before it will be sent to you.  You will wear the monitor for 14 days, remove it and send it back to the company. They will send [place] a report. Then we will call you with the results.  2- Echo  Please return to [facility] on ______________ at _______________ AM/PM for an Echocardiogram. Your physician has requested that you have an echocardiogram. Echocardiography is a painless test that uses sound waves to create images of your heart. It provides your doctor with information about the size and shape of your heart and how well your heart's chambers and valves are working. This procedure takes approximately one hour. There are no restrictions for this procedure. Please note; depending on visual quality an IV may need to be placed.    Follow-Up: At [facility], you and your health needs are our priority.  As part of our continuing mission to  provide you with exceptional heart care, we have created designated Provider Care Teams.  These Care Teams include your primary Cardiologist (physician) and Advanced Practice Providers (APPs -  Physician Assistants and Nurse Practitioners) who all work together to provide you with the care you need, when you need it. You will need a follow up appointment in 6 weeks.  You may see Dr. Fletcher Anon or Christell Faith, PA-C.

## 2019-01-16 NOTE — Telephone Encounter (Signed)
Call to lab, as BMP was not due until Friday.  Spoke to technician to remove charge to patient.   Tech agreed to remove charge.   Call to patient, reviewed results from BNP. I let patient know that she would still need to come on Friday for lab work. Pt verbalized understanding that she will only take 3 days of Lasix and then will hold off until after she is seen in 6 weeks.   Order replaced.

## 2019-01-16 NOTE — Telephone Encounter (Signed)
-----   Message from Rise Mu, PA-C sent at 01/16/2019  3:51 PM EDT ----- Please call the lab. Her BMP was not to be ran yet. Please ensure she does not get charged for this. She will need a new order for Friday.

## 2019-01-17 NOTE — Telephone Encounter (Signed)
See if she is agreeable to do a f/u appt with me - can schedule Friday at 12:00.  Thanks

## 2019-01-18 NOTE — Telephone Encounter (Signed)
Pt scheduled  

## 2019-01-19 ENCOUNTER — Other Ambulatory Visit: Payer: Self-pay

## 2019-01-19 ENCOUNTER — Other Ambulatory Visit
Admission: RE | Admit: 2019-01-19 | Discharge: 2019-01-19 | Disposition: A | Discharge status: Home / Self Care | Payer: Medicare Other | Source: Ambulatory Visit | Attending: Physician Assistant | Admitting: Physician Assistant

## 2019-01-19 ENCOUNTER — Telehealth: Payer: Self-pay | Admitting: *Deleted

## 2019-01-19 ENCOUNTER — Ambulatory Visit (INDEPENDENT_AMBULATORY_CARE_PROVIDER_SITE_OTHER): Payer: Medicare Other | Admitting: Internal Medicine

## 2019-01-19 DIAGNOSIS — K219 Gastro-esophageal reflux disease without esophagitis: Secondary | ICD-10-CM | POA: Diagnosis not present

## 2019-01-19 DIAGNOSIS — D473 Essential (hemorrhagic) thrombocythemia: Secondary | ICD-10-CM

## 2019-01-19 DIAGNOSIS — R079 Chest pain, unspecified: Secondary | ICD-10-CM | POA: Diagnosis not present

## 2019-01-19 DIAGNOSIS — R739 Hyperglycemia, unspecified: Secondary | ICD-10-CM

## 2019-01-19 DIAGNOSIS — G4733 Obstructive sleep apnea (adult) (pediatric): Secondary | ICD-10-CM

## 2019-01-19 DIAGNOSIS — R0602 Shortness of breath: Secondary | ICD-10-CM

## 2019-01-19 DIAGNOSIS — I5032 Chronic diastolic (congestive) heart failure: Secondary | ICD-10-CM | POA: Insufficient documentation

## 2019-01-19 DIAGNOSIS — D75839 Thrombocytosis, unspecified: Secondary | ICD-10-CM

## 2019-01-19 DIAGNOSIS — I5030 Unspecified diastolic (congestive) heart failure: Secondary | ICD-10-CM | POA: Diagnosis not present

## 2019-01-19 LAB — BASIC METABOLIC PANEL
Anion gap: 13 (ref 5–15)
BUN: 14 mg/dL (ref 8–23)
CO2: 23 mmol/L (ref 22–32)
Calcium: 9 mg/dL (ref 8.9–10.3)
Chloride: 104 mmol/L (ref 98–111)
Creatinine, Ser: 0.92 mg/dL (ref 0.44–1.00)
GFR calc Af Amer: >60 mL/min (ref >60)
GFR calc non Af Amer: >60 mL/min (ref >60)
Glucose, Bld: 100 mg/dL — ABNORMAL HIGH (ref 70–99)
Potassium: 3.6 mmol/L (ref 3.5–5.1)
Sodium: 140 mmol/L (ref 135–145)

## 2019-01-19 MED ORDER — POTASSIUM CHLORIDE ER 10 MEQ PO TBCR: 10.0000 meq | EXTENDED_RELEASE_TABLET | Freq: Every day | ORAL | 3 refills | Status: DC

## 2019-01-19 NOTE — Telephone Encounter (Signed)
Results called to pt. Pt verbalized understanding of results to continue furosemide 20 mg daily and to start potassium 10 mEq once a day.  Rx sent to pharmacy.

## 2019-01-19 NOTE — Progress Notes (Signed)
Patient ID: Nicole Sims, female   DOB: Mar 14, 1951, 68 y.o.   MRN: [id number]   Virtual Visit via video Note  This visit type was conducted due to national recommendations for restrictions regarding the COVID-19 pandemic (e.g. social distancing).  This format is felt to be most appropriate for this patient at this time.  All issues noted in this document were discussed and addressed.  No physical exam was performed (except for noted visual exam findings with Video Visits).   I connected with Nicole Sims by a video enabled telemedicine application or telephone and verified that I am speaking with the correct person using two identifiers. Location patient: home Location provider: work  Persons participating in the virtual visit: patient, provider  I discussed the limitations, risks, security and privacy concerns of performing an evaluation and management service by video and the availability of in person appointments. The patient expressed understanding and agreed to proceed.   Reason for visit: scheduled follow up.    HPI: Is s/p right and left heart cath on 04/10/18 that revealed normal coronary arteries with RHC showing mildly elevated filling pressures with a wedge pressure of 12, high normal pulmonary pressure and normal cardiac output.  CTA chest 04/2018 - no evidence of PE, small nodular lesions in the right lung with largest measuring 35m were noted.  Was recently evaluated with sob and chest discomfort.  Evaluated in ER.  COVID negative.  Troponin negative.  Elevated white count - was on prednisone.  CXR without acute changes.  Saw cardiology 01/16/19.  Scheduled for echo.  She was started on lasix.  Previous CT revealed pulmonary nodules.  Also recommend zio.  She reports persistent increased sob and chest discomfort. No significant cough.   Eating.  Some occasional nausea.  No vomiting.  No abdominal pain.  Bowels moving.     ROS: See pertinent positives and negatives per HPI.  Past  Medical History:  Diagnosis Date   Anemia    Anxiety    Asthma    Cataract cortical, senile    Chronic headaches    Depression    Diastolic heart failure (HCC)    Diverticulosis    Environmental allergies    GERD (gastroesophageal reflux disease)    Heart murmur    Hyperglycemia    Hyperlipidemia    Hypertension    Obesity    Osteoarthritis    Palpitations    Restless leg syndrome    Sleep apnea    [place]    Urinary incontinence    mixed   Venous insufficiency    Vitamin D deficiency     Past Surgical History:  Procedure Laterality Date   BLADDER SURGERY     x2   [facility]   BREAST CYST ASPIRATION Bilateral 2005   approximate year   CARDIAC CATHETERIZATION     [place]   CERVICAL CONE BIOPSY     CIS   CHOLECYSTECTOMY     COLONOSCOPY WITH PROPOFOL N/A 07/19/2016   Procedure: COLONOSCOPY WITH PROPOFOL;  Surgeon: RManya Silvas MD;  [facility]  Service: Endoscopy;  Laterality: N/A;   COLONOSCOPY WITH PROPOFOL N/A 10/20/2018   Procedure: COLONOSCOPY WITH PROPOFOL;  Surgeon: SLollie Sails MD;  [facility];  Service: Endoscopy;  Laterality: N/A;   ESOPHAGOGASTRODUODENOSCOPY (EGD) WITH PROPOFOL N/A 07/19/2016   Procedure: ESOPHAGOGASTRODUODENOSCOPY (EGD) WITH PROPOFOL;  Surgeon: RManya Silvas MD;  [facility];  Service: Endoscopy;  Laterality: N/A;   FLEXIBLE SIGMOIDOSCOPY  KNEE ARTHROSCOPY  08/13/08   knee replacement and revision     left   RECTOCELE REPAIR     RIGHT/LEFT HEART CATH AND CORONARY ANGIOGRAPHY Bilateral 04/10/2018   Procedure: RIGHT/LEFT HEART CATH AND CORONARY ANGIOGRAPHY;  Surgeon: Wellington Hampshire, MD;  Location: [facility];  Service: Cardiovascular;  Laterality: Bilateral;   ROTATOR CUFF REPAIR     bilateral   SHOULDER SURGERY  11/17/05   TONSILLECTOMY  1962   VAGINAL HYSTERECTOMY  1974   abnormal pap and carcinoma in  situ    Family History  Problem Relation Age of Onset   Diabetes Mellitus II Father    Thyroid disease Father    Breast cancer Maternal Aunt    Hypertension Mother    Heart Problems Brother    Leukemia Maternal Aunt    Colon cancer Neg Hx    Kidney cancer Neg Hx    Bladder Cancer Neg Hx     SOCIAL HX: reviewed.    Current Outpatient Medications:    albuterol (PROVENTIL HFA;VENTOLIN HFA) 108 (90 BASE) MCG/ACT inhaler, Inhale 2 puffs into the lungs every 4 (four) hours as needed. (Patient taking differently: Inhale 2 puffs into the lungs every 4 (four) hours as needed for wheezing or shortness of breath. ), Disp: 1 Inhaler, Rfl: 3   albuterol (PROVENTIL) (2.5 MG/3ML) 0.083% nebulizer solution, Take 3 mLs (2.5 mg total) by nebulization every 6 (six) hours as needed for wheezing or shortness of breath., Disp: 150 mL, Rfl: 1   aspirin EC 81 MG tablet, Take 81 mg by mouth daily., Disp: , Rfl:    budesonide-formoterol (SYMBICORT) 80-4.5 MCG/ACT inhaler, Inhale 2 puffs into the lungs 2 (two) times daily. (Patient not taking: Reported on 01/16/2019), Disp: 1 Inhaler, Rfl: 3   Calcium Citrate-Vitamin D (CALCIUM CITRATE + D PO), Take 1 tablet by mouth daily. 500/400, Disp: , Rfl:    Cholecalciferol (VITAMIN D-3) 1000 units CAPS, Take 1 capsule by mouth daily. , Disp: , Rfl:    clobetasol ointment (TEMOVATE) 9.67 %, Apply 1 application topically 2 (two) times daily. , Disp: , Rfl: 0   furosemide (LASIX) 20 MG tablet, Take 1 tablet (20 mg total) by mouth daily., Disp: 90 tablet, Rfl: 3   linaclotide (LINZESS) 290 MCG CAPS capsule, Take 290 mcg by mouth daily as needed. , Disp: , Rfl:    magnesium oxide (MAG-OX) 400 MG tablet, Take 400 mg daily by mouth., Disp: , Rfl:    metoprolol succinate (TOPROL-XL) 50 MG 24 hr tablet, TAKE 1 TABLET BY MOUTH  EVERY DAY OR IMMEDIATELY  FOLLOWING A MEAL (Patient taking differently: Take 25 mg by mouth daily. TAKE 1 TABLET BY MOUTH EVERY DAY OR  IMMEDIATELY FOLLOWING A MEAL), Disp: 90 tablet, Rfl: 1   montelukast (SINGULAIR) 10 MG tablet, TAKE 1 TABLET BY MOUTH AT  BEDTIME, Disp: 90 tablet, Rfl: 1   Multiple Vitamin (MULTIVITAMIN) tablet, Take 1 tablet by mouth daily., Disp: , Rfl:    naproxen sodium (ALEVE) 220 MG tablet, Take 220-440 mg by mouth daily as needed (pain)., Disp: , Rfl:    NONFORMULARY OR COMPOUNDED ITEM, Place 1 spray into the nose 2 (two) times daily. Budesonide + Saline Irrigation/Rinse, Disp: , Rfl:    ondansetron (ZOFRAN) 4 MG tablet, Take 1 tablet (4 mg total) by mouth every 8 (eight) hours as needed for nausea or vomiting., Disp: 30 tablet, Rfl: 1   potassium chloride (K-DUR) 10 MEQ tablet, Take 1 tablet (10 mEq  total) by mouth daily., Disp: 90 tablet, Rfl: 3   RABEprazole (ACIPHEX) 20 MG tablet, Take 20 mg by mouth 2 (two) times daily., Disp: , Rfl:    rOPINIRole (REQUIP) 2 MG tablet, Take 2 mg by mouth daily. , Disp: , Rfl: 1   rosuvastatin (CRESTOR) 10 MG tablet, TAKE 1 TABLET BY MOUTH  DAILY, Disp: 90 tablet, Rfl: 1   sucralfate (CARAFATE) 1 g tablet, Take 1 g by mouth daily. , Disp: , Rfl:    venlafaxine XR (EFFEXOR-XR) 150 MG 24 hr capsule, TAKE 1 CAPSULE BY MOUTH  DAILY WITH BREAKFAST, Disp: 90 capsule, Rfl: 0   vitamin B-12 (CYANOCOBALAMIN) 1000 MCG tablet, Take 1,000 mcg by mouth daily., Disp: , Rfl:   EXAM:  GENERAL: alert, oriented, appears well and in no acute distress  HEENT: atraumatic, conjunttiva clear, no obvious abnormalities on inspection of external nose and ears  NECK: normal movements of the head and neck  LUNGS: on inspection no signs of respiratory distress, breathing rate appears normal, no obvious gross SOB, gasping or wheezing  CV: no obvious cyanosis  PSYCH/NEURO: pleasant and cooperative, no obvious depression or anxiety, speech and thought processing grossly intact  ASSESSMENT AND PLAN:  Discussed the following assessment and plan:  SOB (shortness of breath) -  Plan: CT ANGIO CHEST PE W OR WO CONTRAST  Shortness of breath - Plan: CT ANGIO CHEST PE W OR WO CONTRAST  Chest pain, unspecified type - Plan: CT ANGIO CHEST PE W OR WO CONTRAST  Diastolic heart failure, unspecified HF chronicity (HCC)  Gastroesophageal reflux disease without esophagitis  Hyperglycemia  OSA (obstructive sleep apnea)  Thrombocytosis (HCC)  Chest pain Persistent chest pain and sob.  W/up as outlined.  Previous heart cath revealed normal coronaries.  ECHO as outlined.  Saw cardiology.  Recommended a f/u echo to reassess LV function and pulmonary artery pressure.  On lasix now.  Follow metabolic panel.  Given persistent pain, chest tightness and sob and pulmonary nodules, will schedule f/u CT chest to confirm no PE and to evaluate any change in nodules, etc.    Diastolic heart failure ([place]) Has seen cardiology.  Scheduled for f/u echo.  On lasix.    Gastroesophageal reflux disease without esophagitis Some nausea.  Continue aciphex - bid.  Follow.    Hyperglycemia Low carb diet and exercise.  Follow met b anda 1c.    OSA (obstructive sleep apnea) Continue cpap.    SOB (shortness of breath) Continued sob and chest tightness.  Seeing cardiology.  Scheduled for echo.  On lasix.  Schedule CT to reevaluate pulmonary nodules and to confirm no PE.    Thrombocytosis Evaluated by hematology.  Follow cbc.     I discussed the assessment and treatment plan with the patient. The patient was provided an opportunity to ask questions and all were answered. The patient agreed with the plan and demonstrated an understanding of the instructions.   The patient was advised to call back or seek an in-person evaluation if the symptoms worsen or if the condition fails to improve as anticipated.    Einar Pheasant, MD

## 2019-01-19 NOTE — Telephone Encounter (Signed)
-----   Message from Rise Mu, PA-C sent at 01/19/2019 10:01 AM EDT ----- Renal function and potassium are stable following gentle diuresis. Await echo. She can continue current dose of Lasix 20 mg daily. I would like to send in KCl 10 Meq daily as well.

## 2019-01-20 ENCOUNTER — Ambulatory Visit (INDEPENDENT_AMBULATORY_CARE_PROVIDER_SITE_OTHER): Payer: Medicare Other

## 2019-01-20 DIAGNOSIS — R002 Palpitations: Secondary | ICD-10-CM

## 2019-01-21 ENCOUNTER — Encounter: Payer: Self-pay | Admitting: Internal Medicine

## 2019-01-21 NOTE — Assessment & Plan Note (Signed)
Persistent chest pain and sob.  W/up as outlined.  Previous heart cath revealed normal coronaries.  ECHO as outlined.  Saw cardiology.  Recommended a f/u echo to reassess LV function and pulmonary artery pressure.  On lasix now.  Follow metabolic panel.  Given persistent pain, chest tightness and sob and pulmonary nodules, will schedule f/u CT chest to confirm no PE and to evaluate any change in nodules, etc.

## 2019-01-21 NOTE — Assessment & Plan Note (Signed)
Evaluated by hematology.  Follow cbc.

## 2019-01-21 NOTE — Assessment & Plan Note (Signed)
Low carb diet and exercise.  Follow met b anda 1c.

## 2019-01-21 NOTE — Assessment & Plan Note (Signed)
Some nausea.  Continue aciphex - bid.  Follow.

## 2019-01-21 NOTE — Assessment & Plan Note (Signed)
Continue cpap.  

## 2019-01-21 NOTE — Assessment & Plan Note (Signed)
Continued sob and chest tightness.  Seeing cardiology.  Scheduled for echo.  On lasix.  Schedule CT to reevaluate pulmonary nodules and to confirm no PE.

## 2019-01-21 NOTE — Assessment & Plan Note (Signed)
Has seen cardiology.  Scheduled for f/u echo.  On lasix.

## 2019-01-22 ENCOUNTER — Ambulatory Visit
Admission: RE | Admit: 2019-01-22 | Discharge: 2019-01-22 | Disposition: A | Discharge status: Home / Self Care | Payer: Medicare Other | Source: Ambulatory Visit | Attending: Internal Medicine | Admitting: Internal Medicine

## 2019-01-22 ENCOUNTER — Other Ambulatory Visit: Payer: Self-pay

## 2019-01-22 DIAGNOSIS — R079 Chest pain, unspecified: Secondary | ICD-10-CM | POA: Insufficient documentation

## 2019-01-22 DIAGNOSIS — R0602 Shortness of breath: Secondary | ICD-10-CM | POA: Diagnosis not present

## 2019-01-22 HISTORY — DX: Heart failure, unspecified: I50.9

## 2019-01-22 MED ORDER — IOHEXOL 350 MG/ML SOLN
75.0000 mL | Freq: Once | INTRAVENOUS | Status: AC | PRN
  Administered 2019-01-22: 75 mL via INTRAVENOUS

## 2019-01-23 ENCOUNTER — Ambulatory Visit (INDEPENDENT_AMBULATORY_CARE_PROVIDER_SITE_OTHER): Payer: Medicare Other | Admitting: Internal Medicine

## 2019-01-23 ENCOUNTER — Encounter: Payer: Self-pay | Admitting: Internal Medicine

## 2019-01-23 DIAGNOSIS — K219 Gastro-esophageal reflux disease without esophagitis: Secondary | ICD-10-CM

## 2019-01-23 DIAGNOSIS — R079 Chest pain, unspecified: Secondary | ICD-10-CM

## 2019-01-23 DIAGNOSIS — D75839 Thrombocytosis, unspecified: Secondary | ICD-10-CM

## 2019-01-23 DIAGNOSIS — G4733 Obstructive sleep apnea (adult) (pediatric): Secondary | ICD-10-CM

## 2019-01-23 DIAGNOSIS — R739 Hyperglycemia, unspecified: Secondary | ICD-10-CM | POA: Diagnosis not present

## 2019-01-23 DIAGNOSIS — D649 Anemia, unspecified: Secondary | ICD-10-CM | POA: Diagnosis not present

## 2019-01-23 DIAGNOSIS — D473 Essential (hemorrhagic) thrombocythemia: Secondary | ICD-10-CM

## 2019-01-23 DIAGNOSIS — I5030 Unspecified diastolic (congestive) heart failure: Secondary | ICD-10-CM | POA: Diagnosis not present

## 2019-01-23 DIAGNOSIS — R0602 Shortness of breath: Secondary | ICD-10-CM

## 2019-01-23 NOTE — Progress Notes (Signed)
Patient ID: Nicole Sims, female   DOB: 25-Nov-1950, 68 y.o.   MRN: [id number]   Virtual Visit via video Note  This visit type was conducted due to national recommendations for restrictions regarding the COVID-19 pandemic (e.g. social distancing).  This format is felt to be most appropriate for this patient at this time.  All issues noted in this document were discussed and addressed.  No physical exam was performed (except for noted visual exam findings with Video Visits).   I connected with [name] by a video enabled telemedicine application or telephone and verified that I am speaking with the correct person using two identifiers. Location patient: home Location provider: work Persons participating in the virtual visit: patient, provider  I discussed the limitations, risks, security and privacy concerns of performing an evaluation and management service by video and the availability of in person appointments. The patient expressed understanding and agreed to proceed.   Reason for visit: scheduled follow up.   HPI: Being evaluated for increased sob.  See last note for details.  Saw cardiology.  Planning for ECHO.  Recent CT revealed no pulmonary embolus and no acute cardiopulmonary disease.  Discussed results with her. She feels her breathing is better.  Taking lasix.  Also taking aciphex bid.  No acid reflux.  No abdominal pain.  Bowels moving.  No cough or congestion.  Recent covid testing negative.  Using cpap.  Has f/u with hematology this week.     ROS: See pertinent positives and negatives per HPI.  Past Medical History:  Diagnosis Date  . Anemia   . Anxiety   . Asthma   . Cataract cortical, senile   . CHF (congestive heart failure) ([place])   . Chronic headaches   . Depression   . Diastolic heart failure ([place])   . Diverticulosis   . Environmental allergies   . GERD (gastroesophageal reflux disease)   . Heart murmur   . Hyperglycemia   . Hyperlipidemia   . Hypertension    . Obesity   . Osteoarthritis   . Palpitations   . Restless leg syndrome   . Sleep apnea   . Thrombocytosis ([place])   . Urinary incontinence    mixed  . Venous insufficiency   . Vitamin D deficiency     Past Surgical History:  Procedure Laterality Date  . BLADDER SURGERY     x2   [facility]  . BREAST CYST ASPIRATION Bilateral 2005   approximate year  . CARDIAC CATHETERIZATION     [facility]  . CERVICAL CONE BIOPSY     [facility]  . CHOLECYSTECTOMY    . COLONOSCOPY WITH PROPOFOL N/A 07/19/2016   Procedure: COLONOSCOPY WITH PROPOFOL;  Surgeon: Manya Silvas, MD;  [facility];  Service: Endoscopy;  Laterality: N/A;  . COLONOSCOPY WITH PROPOFOL N/A 10/20/2018   Procedure: COLONOSCOPY WITH PROPOFOL;  Surgeon: Lollie Sails, MD;  Location: [facility] ENDOSCOPY;  Service: Endoscopy;  Laterality: N/A;  . ESOPHAGOGASTRODUODENOSCOPY (EGD) WITH PROPOFOL N/A 07/19/2016   Procedure: ESOPHAGOGASTRODUODENOSCOPY (EGD) WITH PROPOFOL;  Surgeon: Manya Silvas, MD;  [facility]COPY;  Service: Endoscopy;  Laterality: N/A;  . FLEXIBLE SIGMOIDOSCOPY    . KNEE ARTHROSCOPY  08/13/08  . knee replacement and revision     left  . RECTOCELE REPAIR    . RIGHT/LEFT HEART CATH AND CORONARY ANGIOGRAPHY Bilateral 04/10/2018   Procedure: RIGHT/LEFT HEART CATH AND CORONARY ANGIOGRAPHY;  Surgeon: Wellington Hampshire, MD;  Location: [place] [facility];  Service: Cardiovascular;  Laterality: Bilateral;  . ROTATOR CUFF REPAIR     bilateral  . SHOULDER SURGERY  11/17/05  . TONSILLECTOMY  1962  . VAGINAL HYSTERECTOMY  1974   abnormal pap and carcinoma in situ    Family History  Problem Relation Age of Onset  . Diabetes Mellitus II Father   . Thyroid disease Father   . Breast cancer Maternal Aunt   . Hypertension Mother   . Heart Problems Brother   . Leukemia Maternal Aunt   . Colon cancer Neg Hx   . Kidney cancer Neg Hx   . Bladder Cancer Neg Hx     SOCIAL HX: reviewed.     Current Outpatient Medications:  .  albuterol (PROVENTIL HFA;VENTOLIN HFA) 108 (90 BASE) MCG/ACT inhaler, Inhale 2 puffs into the lungs every 4 (four) hours as needed. (Patient taking differently: Inhale 2 puffs into the lungs every 4 (four) hours as needed for wheezing or shortness of breath. ), Disp: 1 Inhaler, Rfl: 3 .  albuterol (PROVENTIL) (2.5 MG/3ML) 0.083% nebulizer solution, Take 3 mLs (2.5 mg total) by nebulization every 6 (six) hours as needed for wheezing or shortness of breath., Disp: 150 mL, Rfl: 1 .  aspirin EC 81 MG tablet, Take 81 mg by mouth daily., Disp: , Rfl:  .  budesonide-formoterol (SYMBICORT) 80-4.5 MCG/ACT inhaler, Inhale 2 puffs into the lungs 2 (two) times daily., Disp: 1 Inhaler, Rfl: 3 .  Calcium Citrate-Vitamin D (CALCIUM CITRATE + D PO), Take 1 tablet by mouth daily. 500/400, Disp: , Rfl:  .  Cholecalciferol (VITAMIN D-3) 1000 units CAPS, Take 1 capsule by mouth daily. , Disp: , Rfl:  .  clobetasol ointment (TEMOVATE) 8.88 %, Apply 1 application topically 2 (two) times daily. , Disp: , Rfl: 0 .  furosemide (LASIX) 20 MG tablet, Take 1 tablet (20 mg total) by mouth daily., Disp: 90 tablet, Rfl: 3 .  linaclotide (LINZESS) 290 MCG CAPS capsule, Take 290 mcg by mouth daily as needed. , Disp: , Rfl:  .  magnesium oxide (MAG-OX) 400 MG tablet, Take 400 mg daily by mouth., Disp: , Rfl:  .  metoprolol succinate (TOPROL-XL) 50 MG 24 hr tablet, TAKE 1 TABLET BY MOUTH  EVERY DAY OR IMMEDIATELY  FOLLOWING A MEAL (Patient taking differently: Take 25 mg by mouth daily. TAKE 1 TABLET BY MOUTH EVERY DAY OR IMMEDIATELY FOLLOWING A MEAL), Disp: 90 tablet, Rfl: 1 .  montelukast (SINGULAIR) 10 MG tablet, TAKE 1 TABLET BY MOUTH AT  BEDTIME, Disp: 90 tablet, Rfl: 1 .  Multiple Vitamin (MULTIVITAMIN) tablet, Take 1 tablet by mouth daily., Disp: , Rfl:  .  naproxen sodium (ALEVE) 220 MG tablet, Take 220-440 mg by mouth daily as needed (pain)., Disp: , Rfl:  .  NONFORMULARY OR COMPOUNDED ITEM,  Place 1 spray into the nose 2 (two) times daily. Budesonide + Saline Irrigation/Rinse, Disp: , Rfl:  .  ondansetron (ZOFRAN) 4 MG tablet, Take 1 tablet (4 mg total) by mouth every 8 (eight) hours as needed for nausea or vomiting., Disp: 30 tablet, Rfl: 1 .  potassium chloride (K-DUR) 10 MEQ tablet, Take 1 tablet (10 mEq total) by mouth daily., Disp: 90 tablet, Rfl: 3 .  RABEprazole (ACIPHEX) 20 MG tablet, Take 20 mg by mouth 2 (two) times daily., Disp: , Rfl:  .  rOPINIRole (REQUIP) 2 MG tablet, Take 2 mg by mouth daily. , Disp: , Rfl: 1 .  rosuvastatin (CRESTOR) 10 MG tablet, TAKE 1 TABLET BY MOUTH  DAILY, Disp: 90 tablet, Rfl: 1 .  sucralfate (CARAFATE) 1 g tablet, Take 1 g by mouth daily. , Disp: , Rfl:  .  venlafaxine XR (EFFEXOR-XR) 150 MG 24 hr capsule, TAKE 1 CAPSULE BY MOUTH  DAILY WITH BREAKFAST, Disp: 90 capsule, Rfl: 0 .  vitamin B-12 (CYANOCOBALAMIN) 1000 MCG tablet, Take 1,000 mcg by mouth daily., Disp: , Rfl:   EXAM:  GENERAL: alert, oriented, appears well and in no acute distress  HEENT: atraumatic, conjunttiva clear, no obvious abnormalities on inspection of external nose and ears  NECK: normal movements of the head and neck  LUNGS: on inspection no signs of respiratory distress, breathing rate appears normal, no obvious gross SOB, gasping or wheezing  CV: no obvious cyanosis  PSYCH/NEURO: pleasant and cooperative, no obvious depression or anxiety, speech and thought processing grossly intact  ASSESSMENT AND PLAN:  Discussed the following assessment and plan:  Anemia Seeing hematology.    Chest pain CT chest as outlined.  No acute cardiopulmonary disease.  No PE.  Saw cardiology.  Planning for ECHO.  Breathing better.  Follow.    Diastolic heart failure (HCC) On lasix.  Breathing better.  Seeing cardiology.  Planning for f/u ECHO.    Gastroesophageal reflux disease without esophagitis Controlled on aciphex bid.  Follow.    Hyperglycemia Low carb diet and  exercise.  Follow met b and a1c.   OSA (obstructive sleep apnea) Continue cpap.   SOB (shortness of breath) Breathing better.  Chest CT with no acute cardiopulmonary disease.  No PE.  Saw cardiology.  Planning for f/u ECHO.  Will complete current planned w/up.  Pt comfortable with this plan.    Thrombocytosis Seeing hematology.      I discussed the assessment and treatment plan with the patient. The patient was provided an opportunity to ask questions and all were answered. The patient agreed with the plan and demonstrated an understanding of the instructions.   The patient was advised to call back or seek an in-person evaluation if the symptoms worsen or if the condition fails to improve as anticipated.    Einar Pheasant, MD

## 2019-01-25 ENCOUNTER — Other Ambulatory Visit: Payer: Self-pay

## 2019-01-25 ENCOUNTER — Telehealth: Payer: Self-pay | Admitting: Oncology

## 2019-01-25 ENCOUNTER — Inpatient Hospital Stay: Payer: Medicare Other | Attending: Oncology

## 2019-01-25 DIAGNOSIS — D509 Iron deficiency anemia, unspecified: Secondary | ICD-10-CM | POA: Insufficient documentation

## 2019-01-25 DIAGNOSIS — D473 Essential (hemorrhagic) thrombocythemia: Secondary | ICD-10-CM | POA: Insufficient documentation

## 2019-01-25 DIAGNOSIS — D75839 Thrombocytosis, unspecified: Secondary | ICD-10-CM

## 2019-01-25 LAB — IRON AND TIBC
Iron: 71 ug/dL (ref 28–170)
Saturation Ratios: 20 % (ref 10.4–31.8)
TIBC: 358 ug/dL (ref 250–450)
UIBC: 287 ug/dL

## 2019-01-25 LAB — CBC WITH DIFFERENTIAL/PLATELET
Abs Immature Granulocytes: 0.02 10*3/uL (ref 0.00–0.07)
Basophils Absolute: 0.1 10*3/uL (ref 0.0–0.1)
Basophils Relative: 1 %
Eosinophils Absolute: 0.2 10*3/uL (ref 0.0–0.5)
Eosinophils Relative: 2 %
HCT: 41.4 % (ref 36.0–46.0)
Hemoglobin: 12.9 g/dL (ref 12.0–15.0)
Immature Granulocytes: 0 %
Lymphocytes Relative: 33 %
Lymphs Abs: 2.9 10*3/uL (ref 0.7–4.0)
MCH: 28.1 pg (ref 26.0–34.0)
MCHC: 31.2 g/dL (ref 30.0–36.0)
MCV: 90.2 fL (ref 80.0–100.0)
Monocytes Absolute: 0.7 10*3/uL (ref 0.1–1.0)
Monocytes Relative: 8 %
Neutro Abs: 5 10*3/uL (ref 1.7–7.7)
Neutrophils Relative %: 56 %
Platelets: 421 10*3/uL — ABNORMAL HIGH (ref 150–400)
RBC: 4.59 MIL/uL (ref 3.87–5.11)
RDW: 13.9 % (ref 11.5–15.5)
WBC: 9 10*3/uL (ref 4.0–10.5)
nRBC: 0 % (ref 0.0–0.2)

## 2019-01-25 LAB — FERRITIN: Ferritin: 68 ng/mL (ref 11–307)

## 2019-01-25 NOTE — Telephone Encounter (Signed)
Called pt for appt pre-screen but got no answer. Left vm msg about screening and new guidelines about mask req, no visitors, screening questions they will be asked, and fever checks °

## 2019-01-26 ENCOUNTER — Encounter: Payer: Self-pay | Admitting: Oncology

## 2019-01-26 ENCOUNTER — Other Ambulatory Visit: Payer: Self-pay

## 2019-01-26 ENCOUNTER — Inpatient Hospital Stay: Payer: Medicare Other

## 2019-01-26 ENCOUNTER — Inpatient Hospital Stay (HOSPITAL_BASED_OUTPATIENT_CLINIC_OR_DEPARTMENT_OTHER): Payer: Medicare Other | Admitting: Oncology

## 2019-01-26 VITALS — BP 144/88 | HR 80 | Temp 97.2°F | Resp 18 | Wt 233.7 lb

## 2019-01-26 DIAGNOSIS — D509 Iron deficiency anemia, unspecified: Secondary | ICD-10-CM

## 2019-01-26 DIAGNOSIS — D473 Essential (hemorrhagic) thrombocythemia: Secondary | ICD-10-CM | POA: Diagnosis not present

## 2019-01-26 DIAGNOSIS — D75839 Thrombocytosis, unspecified: Secondary | ICD-10-CM

## 2019-01-26 NOTE — Progress Notes (Signed)
Patient here for follow up. Pt states feeling tired. Complains of having nausea daily, takes zofran and "it helps."

## 2019-01-27 NOTE — Progress Notes (Signed)
Hematology/Oncology follow up note [facility] Telephone:([phone] Fax:([phone]   Patient Care Team: Einar Pheasant, MD as PCP - General (Internal Medicine)  REFERRING PROVIDER: Einar Pheasant, MD CHIEF COMPLAINTS/REASON FOR VISIT:  Evaluation of thrombocytosis and iron deficiency.  HISTORY OF PRESENTING ILLNESS:  Nicole Sims is a  68 y.o.  female with PMH listed below who was referred to me for evaluation of thromcbocytosis  Reviewed patient's labs which were obtained by PCP.  09/12/2018 labs showed elevated platelet counts at 529,000, Wbc 9.6 and hemoglobin 13.3  Reviewed patient's previous labs. Thrombocytosis onset is chronic onset , duration since at least 2014. No aggravating or elevated factors. Associated symptoms or signs:  Denies weight loss, fever, chills, fatigue, night sweats.  Context:  Smoking history: Denies Family history of polycythemia.  Denies History of iron deficiency anemia denies History of DVT denies She feels nauseated.  Scheduled for endoscopy in March 2020.  #Previous work-up showed  ak2 V617F mutation, CARL, MPL, E12-15 negative. Peripheral blood smear showed normal WBC morphology platelet appears increased.  Platelet varies in size with large body platelets noted. Iron panel showed TIBC 413, saturation 16, ferritin 30.  Received Venofer IV 200 mg x 2  INTERVAL HISTORY Nicole Sims is a 68 y.o. female who has above history reviewed by me today presents for follow up visit for management of thrombocytosis and iron deficiency.   Patient has no new complaints.   Patient was recently seen by Dr. Nicki Reaper primary care provider.  Note reviewed. Patient has persistent increased shortness of breath and chest discomfort. Previous cardiac work includes cath on 04/10/2018 revealed normal coronary arteries with RHC showing mildly elevated filling pressure with a wedge pressure of 12.  High normal pulmonary pressure and normal  cardiac output. Patient was recently seen in the emergency room for evaluation of shortness of breath with chest discomfort.  COVID-19 was negative.  Troponin negative.  Patient follows up with cardiology. Patient is on Lasix as well. No significant cough, appetite loss.  Denies any fever, chills, abdominal pain. 01/22/2019 CT angios chest PE showed no evidence of PE.   Review of Systems  Constitutional: Positive for fatigue. Negative for appetite change, chills and fever.  HENT:   Negative for hearing loss and voice change.   Eyes: Negative for eye problems.  Respiratory: Positive for shortness of breath. Negative for chest tightness and cough.   Cardiovascular: Negative for chest pain.  Gastrointestinal: Negative for abdominal distention, abdominal pain and blood in stool.  Endocrine: Negative for hot flashes.  Genitourinary: Negative for difficulty urinating and frequency.   Musculoskeletal: Negative for arthralgias.  Skin: Negative for itching and rash.  Neurological: Negative for extremity weakness.  Hematological: Negative for adenopathy.  Psychiatric/Behavioral: Negative for confusion.    MEDICAL HISTORY:  Past Medical History:  Diagnosis Date   Anemia    Anxiety    Asthma    Cataract cortical, senile    CHF (congestive heart failure) (HCC)    Chronic headaches    Depression    Diastolic heart failure (HCC)    Diverticulosis    Environmental allergies    GERD (gastroesophageal reflux disease)    Heart murmur    Hyperglycemia    Hyperlipidemia    Hypertension    Obesity    Osteoarthritis    Palpitations    Restless leg syndrome    Sleep apnea    Thrombocytosis (HCC)    Urinary incontinence    mixed  Venous insufficiency    Vitamin D deficiency     SURGICAL HISTORY: Past Surgical History:  Procedure Laterality Date   BLADDER SURGERY     x2   [facility]   BREAST CYST ASPIRATION Bilateral 2005   approximate year    CARDIAC CATHETERIZATION     [place]   CERVICAL CONE BIOPSY     CIS   CHOLECYSTECTOMY     COLONOSCOPY WITH PROPOFOL N/A 07/19/2016   Procedure: COLONOSCOPY WITH PROPOFOL;  Surgeon: Manya Silvas, MD;  Location: [facility] ENDOSCOPY;  Service: Endoscopy;  Laterality: N/A;   COLONOSCOPY WITH PROPOFOL N/A 10/20/2018   Procedure: COLONOSCOPY WITH PROPOFOL;  Surgeon: Lollie Sails, MD;  Location: [facility] ENDOSCOPY;  Service: Endoscopy;  Laterality: N/A;   ESOPHAGOGASTRODUODENOSCOPY (EGD) WITH PROPOFOL N/A 07/19/2016   Procedure: ESOPHAGOGASTRODUODENOSCOPY (EGD) WITH PROPOFOL;  Surgeon: Manya Silvas, MD;  Location: [facility] ENDOSCOPY;  Service: Endoscopy;  Laterality: N/A;   FLEXIBLE SIGMOIDOSCOPY     KNEE ARTHROSCOPY  08/13/08   knee replacement and revision     left   RECTOCELE REPAIR     RIGHT/LEFT HEART CATH AND CORONARY ANGIOGRAPHY Bilateral 04/10/2018   Procedure: RIGHT/LEFT HEART CATH AND CORONARY ANGIOGRAPHY;  Surgeon: Wellington Hampshire, MD;  Location: [place];  Service: Cardiovascular;  Laterality: Bilateral;   ROTATOR CUFF REPAIR     bilateral   SHOULDER SURGERY  11/17/05   TONSILLECTOMY  1962   VAGINAL HYSTERECTOMY  1974   abnormal pap and carcinoma in situ    SOCIAL HISTORY: Social History   Socioeconomic History   Marital status: Married    Spouse name: Not on file   Number of children: Not on file   Years of education: Not on file   Highest education level: Not on file  Occupational History   Occupation: retired  Scientist, product/process development strain: Not on file   Food insecurity    Worry: Not on file    Inability: Not on file   Transportation needs    Medical: Not on file    Non-medical: Not on file  Tobacco Use   Smoking status: Former Smoker    Quit date: 08/17/1983    Years since quitting: 35.4   Smokeless tobacco: Never Used  Substance and Sexual Activity   Alcohol use: Yes    Alcohol/week: 0.0 standard drinks     Comment: rarely   Drug use: No   Sexual activity: Not on file  Lifestyle   Physical activity    Days per week: Not on file    Minutes per session: Not on file   Stress: Not on file  Relationships   Social connections    Talks on phone: Not on file    Gets together: Not on file    Attends religious service: Not on file    Active member of club or organization: Not on file    Attends meetings of clubs or organizations: Not on file    Relationship status: Not on file   Intimate partner violence    Fear of current or ex partner: Not on file    Emotionally abused: Not on file    Physically abused: Not on file    Forced sexual activity: Not on file  Other Topics Concern   Not on file  Social History Narrative   Not on file    FAMILY HISTORY: Family History  Problem Relation Age of Onset   Diabetes Mellitus II Father  Thyroid disease Father    Breast cancer Maternal Aunt    Hypertension Mother    Heart Problems Brother    Leukemia Maternal Aunt    Colon cancer Neg Hx    Kidney cancer Neg Hx    Bladder Cancer Neg Hx     ALLERGIES:  is allergic to augmentin [amoxicillin-pot clavulanate]; bactrim [sulfamethoxazole-trimethoprim]; doxycycline; hydrocodone-acetaminophen; levaquin [levofloxacin]; pseudoephedrine; relafen [nabumetone]; and shellfish allergy.  MEDICATIONS:  Current Outpatient Medications  Medication Sig Dispense Refill   albuterol (PROVENTIL HFA;VENTOLIN HFA) 108 (90 BASE) MCG/ACT inhaler Inhale 2 puffs into the lungs every 4 (four) hours as needed. (Patient taking differently: Inhale 2 puffs into the lungs every 4 (four) hours as needed for wheezing or shortness of breath. ) 1 Inhaler 3   albuterol (PROVENTIL) (2.5 MG/3ML) 0.083% nebulizer solution Take 3 mLs (2.5 mg total) by nebulization every 6 (six) hours as needed for wheezing or shortness of breath. 150 mL 1   aspirin EC 81 MG tablet Take 81 mg by mouth daily.     budesonide-formoterol  (SYMBICORT) 80-4.5 MCG/ACT inhaler Inhale 2 puffs into the lungs 2 (two) times daily. 1 Inhaler 3   Calcium Citrate-Vitamin D (CALCIUM CITRATE + D PO) Take 1 tablet by mouth daily. 500/400     Cholecalciferol (VITAMIN D-3) 1000 units CAPS Take 1 capsule by mouth daily.      clobetasol ointment (TEMOVATE) 3.78 % Apply 1 application topically 2 (two) times daily.   0   furosemide (LASIX) 20 MG tablet Take 1 tablet (20 mg total) by mouth daily. 90 tablet 3   linaclotide (LINZESS) 290 MCG CAPS capsule Take 290 mcg by mouth daily as needed.      magnesium oxide (MAG-OX) 400 MG tablet Take 400 mg daily by mouth.     metoprolol succinate (TOPROL-XL) 50 MG 24 hr tablet TAKE 1 TABLET BY MOUTH  EVERY DAY OR IMMEDIATELY  FOLLOWING A MEAL (Patient taking differently: Take 25 mg by mouth daily. TAKE 1 TABLET BY MOUTH EVERY DAY OR IMMEDIATELY FOLLOWING A MEAL) 90 tablet 1   montelukast (SINGULAIR) 10 MG tablet TAKE 1 TABLET BY MOUTH AT  BEDTIME 90 tablet 1   Multiple Vitamin (MULTIVITAMIN) tablet Take 1 tablet by mouth daily.     naproxen sodium (ALEVE) 220 MG tablet Take 220-440 mg by mouth daily as needed (pain).     NONFORMULARY OR COMPOUNDED ITEM Place 1 spray into the nose 2 (two) times daily. Budesonide + Saline Irrigation/Rinse     ondansetron (ZOFRAN) 4 MG tablet Take 1 tablet (4 mg total) by mouth every 8 (eight) hours as needed for nausea or vomiting. 30 tablet 1   potassium chloride (K-DUR) 10 MEQ tablet Take 1 tablet (10 mEq total) by mouth daily. 90 tablet 3   RABEprazole (ACIPHEX) 20 MG tablet Take 20 mg by mouth 2 (two) times daily.     rOPINIRole (REQUIP) 2 MG tablet Take 2 mg by mouth daily.   1   rosuvastatin (CRESTOR) 10 MG tablet TAKE 1 TABLET BY MOUTH  DAILY 90 tablet 1   sucralfate (CARAFATE) 1 g tablet Take 1 g by mouth daily.      venlafaxine XR (EFFEXOR-XR) 150 MG 24 hr capsule TAKE 1 CAPSULE BY MOUTH  DAILY WITH BREAKFAST 90 capsule 0   vitamin B-12 (CYANOCOBALAMIN)  1000 MCG tablet Take 1,000 mcg by mouth daily.     No current facility-administered medications for this visit.      PHYSICAL EXAMINATION: ECOG PERFORMANCE  STATUS: 1 - Symptomatic but completely ambulatory Vitals:   01/26/19 1327  BP: (!) 144/88  Pulse: 80  Resp: 18  Temp: (!) 97.2 F (36.2 C)   Filed Weights   01/26/19 1327  Weight: 233 lb 11.2 oz (106 kg)    Physical Exam Constitutional:      General: She is not in acute distress. HENT:     Head: Normocephalic and atraumatic.  Eyes:     General: No scleral icterus.    Pupils: Pupils are equal, round, and reactive to light.  Neck:     Musculoskeletal: Normal range of motion and neck supple.  Cardiovascular:     Rate and Rhythm: Normal rate and regular rhythm.     Heart sounds: Normal heart sounds.  Pulmonary:     Effort: Pulmonary effort is normal. No respiratory distress.     Breath sounds: No wheezing.  Abdominal:     General: Bowel sounds are normal. There is no distension.     Palpations: Abdomen is soft. There is no mass.     Tenderness: There is no abdominal tenderness.  Musculoskeletal: Normal range of motion.        General: No deformity.  Skin:    General: Skin is warm and dry.     Findings: No erythema or rash.  Neurological:     Mental Status: She is alert and oriented to person, place, and time.     Cranial Nerves: No cranial nerve deficit.     Coordination: Coordination normal.  Psychiatric:        Behavior: Behavior normal.        Thought Content: Thought content normal.      LABORATORY DATA:  I have reviewed the data as listed Lab Results  Component Value Date   WBC 9.0 01/25/2019   HGB 12.9 01/25/2019   HCT 41.4 01/25/2019   MCV 90.2 01/25/2019   PLT 421 (H) 01/25/2019   Recent Labs    05/18/18 0914 08/25/18 0817 01/03/19 0837 01/09/19 1727 01/16/19 1505 01/19/19 0927  NA 140 141 140 138 137 140  K 4.7 4.0 4.5 3.5 3.7 3.6  CL 106 105 105 105 106 104  CO2 28 27 28  21* 23 23   GLUCOSE 101* 100* 97 94 113* 100*  BUN 16 16 13 17 15 14   CREATININE 1.02 0.99 0.95 0.98 0.95 0.92  CALCIUM 9.7 9.6 9.4 9.1 8.8* 9.0  GFRNONAA  --   --   --  59* >60 >60  GFRAA  --   --   --  >60 >60 >60  PROT 7.5 7.8 7.5  --   --   --   ALBUMIN 4.1 4.1 4.1  --   --   --   AST 14 11 13   --   --   --   ALT 11 10 13   --   --   --   ALKPHOS 85 90 99  --   --   --   BILITOT 0.5 0.5 0.4  --   --   --   BILIDIR 0.1 0.1 0.1  --   --   --    Iron/TIBC/Ferritin/ %Sat    Component Value Date/Time   IRON 71 01/25/2019 1118   TIBC 358 01/25/2019 1118   FERRITIN 68 01/25/2019 1118   IRONPCTSAT 20 01/25/2019 1118    RADIOGRAPHIC STUDIES: I have personally reviewed the radiological images as listed and agreed with the findings in the report. 01/22/2019 CT  angiogram  chest PE protocol No evidence of pulmonary embolus.  No acute cardiopulmonary disease.  ASSESSMENT & PLAN:  1. Thrombocytosis ([place])   2. Iron deficiency anemia, unspecified iron deficiency anemia type    #Labs are reviewed and discussed with patient. Thrombocytosis, most likely reactive.  Trending down to 421,000. Iron deficiency, iron panel has improved comparing to 4 months ago. colonoscopy on 10/20/2018.  Showed benign polyps, diverticulosis. Hold additional IV iron at this point. Monitor counts. Follow-up with primary care physician for annual mammogram. Shortness of breath chest discomfort, follow-up with cardiology.  2D echo pending.   No orders of the defined types were placed in this encounter.   All questions were answered. The patient knows to call the clinic with any problems questions or concerns.  Return of visit: 4 months for MD assessment, labs   Earlie Server, MD, PhD

## 2019-01-28 ENCOUNTER — Encounter: Payer: Self-pay | Admitting: Internal Medicine

## 2019-01-28 NOTE — Assessment & Plan Note (Signed)
Breathing better.  Chest CT with no acute cardiopulmonary disease.  No PE.  Saw cardiology.  Planning for f/u ECHO.  Will complete current planned w/up.  Pt comfortable with this plan.

## 2019-01-28 NOTE — Assessment & Plan Note (Signed)
Controlled on aciphex bid.  Follow.

## 2019-01-28 NOTE — Assessment & Plan Note (Signed)
Seeing hematology.   

## 2019-01-28 NOTE — Assessment & Plan Note (Signed)
Continue cpap.  

## 2019-01-28 NOTE — Assessment & Plan Note (Signed)
On lasix.  Breathing better.  Seeing cardiology.  Planning for f/u ECHO.

## 2019-01-28 NOTE — Assessment & Plan Note (Signed)
CT chest as outlined.  No acute cardiopulmonary disease.  No PE.  Saw cardiology.  Planning for ECHO.  Breathing better.  Follow.

## 2019-01-28 NOTE — Assessment & Plan Note (Signed)
Low carb diet and exercise.  Follow met b and a1c.  

## 2019-02-06 ENCOUNTER — Telehealth: Payer: Self-pay

## 2019-02-06 NOTE — Telephone Encounter (Signed)

## 2019-02-07 ENCOUNTER — Other Ambulatory Visit: Payer: Self-pay

## 2019-02-07 ENCOUNTER — Ambulatory Visit (INDEPENDENT_AMBULATORY_CARE_PROVIDER_SITE_OTHER): Payer: Medicare Other

## 2019-02-07 DIAGNOSIS — R0602 Shortness of breath: Secondary | ICD-10-CM

## 2019-02-09 ENCOUNTER — Telehealth: Payer: Self-pay | Admitting: *Deleted

## 2019-02-09 NOTE — Telephone Encounter (Signed)
-----   Message from Rise Mu, PA-C sent at 02/08/2019  7:18 AM EDT ----- Echo showed normal pump function, normal relaxation of the heart, normal Brouillard motion, upper limits of normal pressure on the right side of the heart, no significant valvular abnormalities with trivially leaky aortic valve, normal size and structure of the aortic root and ascending aorta. Reassuring echo. If she would like, we can refer to pulmonology.

## 2019-02-09 NOTE — Telephone Encounter (Signed)
Results called to pt. Pt verbalized understanding.  Patient does not want to do pulmonology referral at this time.  She will wait to discuss with Dr Fletcher Anon at follow up.

## 2019-02-10 DIAGNOSIS — R002 Palpitations: Secondary | ICD-10-CM | POA: Diagnosis not present

## 2019-02-13 ENCOUNTER — Other Ambulatory Visit: Payer: Self-pay

## 2019-02-15 ENCOUNTER — Telehealth: Payer: Self-pay | Admitting: *Deleted

## 2019-02-15 MED ORDER — METOPROLOL SUCCINATE ER 100 MG PO TB24: 100.0000 mg | ORAL_TABLET | Freq: Every day | ORAL | 3 refills | Status: DC

## 2019-02-15 NOTE — Telephone Encounter (Signed)
-----   Message from Rise Mu, [place] sent at 02/15/2019  2:01 PM EDT ----- Outpatient cardiac monitoring showed predominant rhythm of normal sinus with an average heart rate of 80 bpm.  7 episodes of SVT with the longest lasting 7 beats.  Rare PVCs.  Overall, this is a reassuring heart monitor with rare short lasting episodes of SVT.  Given her BP running in the 233I systolic with an average heart rate of 80 bpm I recommend we increase metoprolol to 50 mg daily in an effort to minimize SVT burden.  Her medication list indicates she is taking 25 mg of Toprol-XL.  However, if it is found she is actually taking 50 mg of Toprol-XL I recommend we increase this to 100 mg daily.

## 2019-02-15 NOTE — Telephone Encounter (Signed)
Results called to pt. Pt verbalized understanding. She has been taking metoprolol 50 mg daily as she increased it back a few weeks ago. She is agreeable to increase to 100 mg daily. Rx sent to pharmacy.

## 2019-02-22 ENCOUNTER — Other Ambulatory Visit: Payer: Self-pay

## 2019-02-22 ENCOUNTER — Ambulatory Visit
Admission: RE | Admit: 2019-02-22 | Discharge: 2019-02-22 | Disposition: A | Discharge status: Home / Self Care | Payer: Medicare Other | Source: Ambulatory Visit | Attending: Internal Medicine | Admitting: Internal Medicine

## 2019-02-22 DIAGNOSIS — N6001 Solitary cyst of right breast: Secondary | ICD-10-CM | POA: Diagnosis not present

## 2019-02-22 DIAGNOSIS — N6011 Diffuse cystic mastopathy of right breast: Secondary | ICD-10-CM | POA: Diagnosis not present

## 2019-02-22 DIAGNOSIS — R928 Other abnormal and inconclusive findings on diagnostic imaging of breast: Secondary | ICD-10-CM

## 2019-02-22 DIAGNOSIS — R922 Inconclusive mammogram: Secondary | ICD-10-CM | POA: Diagnosis not present

## 2019-02-27 ENCOUNTER — Encounter: Payer: Self-pay | Admitting: Internal Medicine

## 2019-02-28 ENCOUNTER — Telehealth: Payer: Self-pay | Admitting: Cardiovascular Disease

## 2019-02-28 NOTE — Telephone Encounter (Signed)

## 2019-03-01 ENCOUNTER — Other Ambulatory Visit: Payer: Self-pay

## 2019-03-01 ENCOUNTER — Encounter: Payer: Self-pay | Admitting: Cardiovascular Disease

## 2019-03-01 ENCOUNTER — Ambulatory Visit (INDEPENDENT_AMBULATORY_CARE_PROVIDER_SITE_OTHER): Payer: Medicare Other | Admitting: Cardiovascular Disease

## 2019-03-01 VITALS — BP 122/78 | HR 58 | Ht 65.0 in | Wt 235.5 lb

## 2019-03-01 DIAGNOSIS — I5030 Unspecified diastolic (congestive) heart failure: Secondary | ICD-10-CM | POA: Diagnosis not present

## 2019-03-01 DIAGNOSIS — E785 Hyperlipidemia, unspecified: Secondary | ICD-10-CM | POA: Diagnosis not present

## 2019-03-01 DIAGNOSIS — R002 Palpitations: Secondary | ICD-10-CM

## 2019-03-01 NOTE — Progress Notes (Signed)
Cardiology Office Note   Date:  03/01/2019   ID:  Nicole Sims, DOB Sep 06, 1950, MRN [id number]  PCP:  Einar Pheasant, MD  Cardiologist:   Kathlyn Sacramento, MD   Chief Complaint  Patient presents with  . other    Follow up Zio monitor and Echo. Meds reviewed by the pt. verbally. Pt. c/o shortness of breath.       History of Present Illness: Nicole Sims is a 68 y.o. female who is here today for follow-up visit regarding r exertional dyspnea and palpitations.  She has multiple chronic medical conditions that include hyperlipidemia, sleep apnea on CPAP, essential hypertension and obesity.   She was evaluated last year for persistent exertional dyspnea.  A right and left cardiac catheterization was done in August 2019 which showed normal coronary arteries and normal LV systolic function.  Right heart catheterization showed mildly elevated filling pressures with pulmonary capillary wedge pressure of 13 mmHg, no pulmonary hypertension and normal cardiac output. CTA of the chest showed no evidence of pulmonary embolism. She was seen by Christell Faith and complained of increased palpitations.  She had a 14-day ZIO monitor that showed normal sinus rhythm with a heart rate of 80 bpm.  She had short runs of SVT the longest lasted 7 beats.  The dose of Toprol was increased to 100 mg once daily with significant improvement in symptoms.   Past Medical History:  Diagnosis Date  . Anemia   . Anxiety   . Asthma   . Cataract cortical, senile   . CHF (congestive heart failure) ([place])   . Chronic headaches   . Depression   . Diastolic heart failure ([place])   . Diverticulosis   . Environmental allergies   . GERD (gastroesophageal reflux disease)   . Heart murmur   . Hyperglycemia   . Hyperlipidemia   . Hypertension   . Obesity   . Osteoarthritis   . Palpitations   . Restless leg syndrome   . Sleep apnea   . Thrombocytosis ([place])   . Urinary incontinence    mixed  . Venous insufficiency   .  Vitamin D deficiency     Past Surgical History:  Procedure Laterality Date  . BLADDER SURGERY     x2   [facility]  . BREAST CYST ASPIRATION Bilateral 2005   approximate year  . CARDIAC CATHETERIZATION     [facility]  . CERVICAL CONE BIOPSY     [facility]  . CHOLECYSTECTOMY    . COLONOSCOPY WITH PROPOFOL N/A 07/19/2016   Procedure: COLONOSCOPY WITH PROPOFOL;  Surgeon: Manya Silvas, MD;  Location: [facility] ENDOSCOPY;  Service: Endoscopy;  Laterality: N/A;  . COLONOSCOPY WITH PROPOFOL N/A 10/20/2018   Procedure: COLONOSCOPY WITH PROPOFOL;  Surgeon: Lollie Sails, MD;  Location: [facility] ENDOSCOPY;  Service: Endoscopy;  Laterality: N/A;  . ESOPHAGOGASTRODUODENOSCOPY (EGD) WITH PROPOFOL N/A 07/19/2016   Procedure: ESOPHAGOGASTRODUODENOSCOPY (EGD) WITH PROPOFOL;  Surgeon: Manya Silvas, MD;  Location: [facility] ENDOSCOPY;  Service: Endoscopy;  Laterality: N/A;  . FLEXIBLE SIGMOIDOSCOPY    . KNEE ARTHROSCOPY  08/13/08  . knee replacement and revision     left  . RECTOCELE REPAIR    . RIGHT/LEFT HEART CATH AND CORONARY ANGIOGRAPHY Bilateral 04/10/2018   Procedure: RIGHT/LEFT HEART CATH AND CORONARY ANGIOGRAPHY;  Surgeon: Wellington Hampshire, MD;  Location: [place] [facility] CV LAB;  Service: Cardiovascular;  Laterality: Bilateral;  . ROTATOR CUFF REPAIR     bilateral  . SHOULDER SURGERY  11/17/05  . TONSILLECTOMY  1962  . VAGINAL HYSTERECTOMY  1974   abnormal pap and carcinoma in situ     Current Outpatient Medications  Medication Sig Dispense Refill  . albuterol (PROVENTIL HFA;VENTOLIN HFA) 108 (90 BASE) MCG/ACT inhaler Inhale 2 puffs into the lungs every 4 (four) hours as needed. (Patient taking differently: Inhale 2 puffs into the lungs every 4 (four) hours as needed for wheezing or shortness of breath. ) 1 Inhaler 3  . albuterol (PROVENTIL) (2.5 MG/3ML) 0.083% nebulizer solution Take 3 mLs (2.5 mg total) by nebulization every 6 (six) hours as needed for wheezing or shortness of breath. 150 mL  1  . aspirin EC 81 MG tablet Take 81 mg by mouth daily.    . budesonide-formoterol (SYMBICORT) 80-4.5 MCG/ACT inhaler Inhale 2 puffs into the lungs 2 (two) times daily. 1 Inhaler 3  . Calcium Citrate-Vitamin D (CALCIUM CITRATE + D PO) Take 1 tablet by mouth daily. 500/400    . Cholecalciferol (VITAMIN D-3) 1000 units CAPS Take 1 capsule by mouth daily.     . clobetasol ointment (TEMOVATE) 2.99 % Apply 1 application topically 2 (two) times daily.   0  . furosemide (LASIX) 20 MG tablet Take 1 tablet (20 mg total) by mouth daily. 90 tablet 3  . linaclotide (LINZESS) 290 MCG CAPS capsule Take 290 mcg by mouth daily as needed.     . magnesium oxide (MAG-OX) 400 MG tablet Take 400 mg daily by mouth.    . metoprolol succinate (TOPROL-XL) 100 MG 24 hr tablet Take 1 tablet (100 mg total) by mouth daily. Take with or immediately following a meal. 90 tablet 3  . montelukast (SINGULAIR) 10 MG tablet TAKE 1 TABLET BY MOUTH AT  BEDTIME 90 tablet 1  . Multiple Vitamin (MULTIVITAMIN) tablet Take 1 tablet by mouth daily.    . naproxen sodium (ALEVE) 220 MG tablet Take 220-440 mg by mouth daily as needed (pain).    . NONFORMULARY OR COMPOUNDED ITEM Place 1 spray into the nose 2 (two) times daily. Budesonide + Saline Irrigation/Rinse    . ondansetron (ZOFRAN) 4 MG tablet Take 1 tablet (4 mg total) by mouth every 8 (eight) hours as needed for nausea or vomiting. 30 tablet 1  . potassium chloride (K-DUR) 10 MEQ tablet Take 1 tablet (10 mEq total) by mouth daily. 90 tablet 3  . RABEprazole (ACIPHEX) 20 MG tablet Take 20 mg by mouth 2 (two) times daily.    Marland Kitchen rOPINIRole (REQUIP) 2 MG tablet Take 2 mg by mouth daily.   1  . rosuvastatin (CRESTOR) 10 MG tablet TAKE 1 TABLET BY MOUTH  DAILY 90 tablet 1  . sucralfate (CARAFATE) 1 g tablet Take 1 g by mouth daily.     Marland Kitchen venlafaxine XR (EFFEXOR-XR) 150 MG 24 hr capsule TAKE 1 CAPSULE BY MOUTH  DAILY WITH BREAKFAST 90 capsule 0  . vitamin B-12 (CYANOCOBALAMIN) 1000 MCG tablet  Take 1,000 mcg by mouth daily.     No current facility-administered medications for this visit.     Allergies:   Augmentin [amoxicillin-pot clavulanate], Bactrim [sulfamethoxazole-trimethoprim], Doxycycline, Hydrocodone-acetaminophen, Levaquin [levofloxacin], Pseudoephedrine, Relafen [nabumetone], and Shellfish allergy    Social History:  The patient  reports that she quit smoking about 35 years ago. She has never used smokeless tobacco. She reports current alcohol use. She reports that she does not use drugs.   Family History:  The patient's family history includes Breast cancer in her maternal aunt; Diabetes Mellitus II in  her father; Heart Problems in her brother; Hypertension in her mother; Leukemia in her maternal aunt; Thyroid disease in her father.    ROS:  Please see the history of present illness.   Otherwise, review of systems are positive for none.   All other systems are reviewed and negative.    PHYSICAL EXAM: VS:  BP 122/78 (BP Location: Left Arm, Patient Position: Sitting, Cuff Size: Normal)   Pulse (!) 58   Ht 5\' 5"  (1.651 m)   Wt 235 lb 8 oz (106.8 kg)   BMI 39.19 kg/m  , BMI Body mass index is 39.19 kg/m. GEN: Well nourished, well developed, in no acute distress  HEENT: normal  Neck: no JVD, carotid bruits, or masses Cardiac: RRR; no murmurs, rubs, or gallops,no edema  Respiratory:  clear to auscultation bilaterally, normal work of breathing GI: soft, nontender, nondistended, + BS MS: no deformity or atrophy  Skin: warm and dry, no rash Neuro:  Strength and sensation are intact Psych: euthymic mood, full affect   EKG:  EKG is ordered today. The ekg ordered today demonstrates sinus bradycardia with a heart rate of 58 bpm.   Recent Labs: 08/25/2018: TSH 2.48 01/03/2019: ALT 13 01/16/2019: B Natriuretic Peptide 24.0 01/19/2019: BUN 14; Creatinine, Ser 0.92; Potassium 3.6; Sodium 140 01/25/2019: Hemoglobin 12.9; Platelets 421    Lipid Panel    Component Value  Date/Time   CHOL 202 (H) 01/03/2019 0837   TRIG 112.0 01/03/2019 0837   HDL 66.40 01/03/2019 0837   CHOLHDL 3 01/03/2019 0837   VLDL 22.4 01/03/2019 0837   LDLCALC 113 (H) 01/03/2019 0837   LDLDIRECT 172.8 09/17/2013 0950      Wt Readings from Last 3 Encounters:  03/01/19 235 lb 8 oz (106.8 kg)  01/26/19 233 lb 11.2 oz (106 kg)  01/16/19 229 lb 8 oz (104.1 kg)       PAD Screen 04/04/2018  Previous PAD dx? No  Previous surgical procedure? No  Pain with walking? Yes  Subsides with rest? Yes  Feet/toe relief with dangling? No  Painful, non-healing ulcers? No  Extremities discolored? No      ASSESSMENT AND PLAN:  1.  Palpitations: Short runs of SVT noted on monitor.  Symptoms improved significantly with increasing Toprol.    2.  Exertional dyspnea: Unlikely to be cardiac in nature based on previous cardiac work-up.  A mild degree of diastolic heart failure is a possibility.  She reports stable symptoms overall.  If there is worsening in the future, I recommend pulmonary evaluation.  3.  Hyperlipidemia: Currently on rosuvastatin.    Disposition:   FU  in 1 year.  Signed,  Kathlyn Sacramento, MD  03/01/2019 4:21 PM    [place]

## 2019-03-01 NOTE — Patient Instructions (Signed)
Medication Instructions:  Your physician recommends that you continue on your current medications as directed. Please refer to the Current Medication list given to you today.  If you need a refill on your cardiac medications before your next appointment, please call your pharmacy.   Lab work: None ordered If you have labs (blood work) drawn today and your tests are completely normal, you will receive your results only by: Marland Kitchen MyChart Message (if you have MyChart) OR . A paper copy in the mail If you have any lab test that is abnormal or we need to change your treatment, we will call you to review the results.  Testing/Procedures: None ordered  Follow-Up: At [facility], you and your health needs are our priority.  As part of our continuing mission to provide you with exceptional heart care, we have created designated Provider Care Teams.  These Care Teams include your primary Cardiologist (physician) and Advanced Practice Providers (APPs -  Physician Assistants and Nurse Practitioners) who all work together to provide you with the care you need, when you need it. You will need a follow up appointment in 12 months.  Please call our office 2 months in advance to schedule this appointment.  You may see  Dr.Arida or one of the following Advanced Practice Providers on your designated Care Team:   Murray Hodgkins, NP Christell Faith, PA-C . Marrianne Mood, PA-C

## 2019-03-05 NOTE — Telephone Encounter (Signed)
Just need to clarify.  Has she tried gabapentin previously?  Does she tolerate?  If has not tried, I would like to start a lower dose and titrate.

## 2019-03-06 ENCOUNTER — Telehealth: Payer: Self-pay

## 2019-03-06 MED ORDER — GABAPENTIN 300 MG PO CAPS: 300.0000 mg | ORAL_CAPSULE | Freq: Every evening | ORAL | 1 refills | Status: DC | PRN

## 2019-03-06 NOTE — Telephone Encounter (Signed)
Copied from [place] [id number]. Topic: General - Other >> Mar 06, 2019  2:44 PM Leward Quan A wrote: Reason for CRM: Patient called to speak to Larena Glassman say that she was returning a call, asking for a call back at Ph# [phone]

## 2019-03-06 NOTE — Telephone Encounter (Signed)
LMTCB

## 2019-03-06 NOTE — Telephone Encounter (Signed)
Patient has taken before in the past and tolerated fine. She did take one of her husbands 300 mg and was fine

## 2019-03-06 NOTE — Telephone Encounter (Signed)
See my chart message

## 2019-03-06 NOTE — Telephone Encounter (Signed)
rx sent in for gabapentin 300mg  #30 with one refill.

## 2019-03-13 ENCOUNTER — Other Ambulatory Visit: Payer: Self-pay

## 2019-03-13 DIAGNOSIS — R110 Nausea: Secondary | ICD-10-CM

## 2019-03-13 NOTE — Telephone Encounter (Signed)
How often is she using zofran.  Not usually something that we continue to refill.  Let me know if problems.

## 2019-03-15 MED ORDER — ONDANSETRON HCL 4 MG PO TABS: 4.0000 mg | ORAL_TABLET | Freq: Two times a day (BID) | ORAL | 0 refills | Status: DC | PRN

## 2019-03-15 NOTE — Telephone Encounter (Signed)
Sent in for zofran

## 2019-03-15 NOTE — Telephone Encounter (Signed)
Was having issues with nausea while taking requip. Now off of requip, symptoms are better. She has only taken one zofran this week. Would like a refill to have on hand if needed.

## 2019-03-15 NOTE — Addendum Note (Signed)
Addended by: Alisa Graff on: 03/15/2019 02:39 PM   Modules accepted: Orders

## 2019-03-21 ENCOUNTER — Other Ambulatory Visit: Payer: Self-pay | Admitting: Internal Medicine

## 2019-03-21 ENCOUNTER — Telehealth: Payer: Self-pay

## 2019-03-21 MED ORDER — FUROSEMIDE 20 MG PO TABS: 20.0000 mg | ORAL_TABLET | Freq: Every day | ORAL | 3 refills | Status: DC

## 2019-03-21 MED ORDER — POTASSIUM CHLORIDE ER 10 MEQ PO TBCR: 10.0000 meq | EXTENDED_RELEASE_TABLET | Freq: Every day | ORAL | 3 refills | Status: DC

## 2019-03-21 NOTE — Telephone Encounter (Signed)
RX for Potass Cl and Furosemide sent to [facility].

## 2019-03-28 DIAGNOSIS — H2513 Age-related nuclear cataract, bilateral: Secondary | ICD-10-CM | POA: Diagnosis not present

## 2019-04-23 DIAGNOSIS — J069 Acute upper respiratory infection, unspecified: Secondary | ICD-10-CM | POA: Diagnosis not present

## 2019-05-04 DIAGNOSIS — M5030 Other cervical disc degeneration, unspecified cervical region: Secondary | ICD-10-CM | POA: Diagnosis not present

## 2019-05-04 DIAGNOSIS — M62838 Other muscle spasm: Secondary | ICD-10-CM | POA: Diagnosis not present

## 2019-05-04 DIAGNOSIS — M5136 Other intervertebral disc degeneration, lumbar region: Secondary | ICD-10-CM | POA: Diagnosis not present

## 2019-05-04 DIAGNOSIS — M47812 Spondylosis without myelopathy or radiculopathy, cervical region: Secondary | ICD-10-CM | POA: Diagnosis not present

## 2019-05-04 DIAGNOSIS — M5416 Radiculopathy, lumbar region: Secondary | ICD-10-CM | POA: Diagnosis not present

## 2019-05-09 ENCOUNTER — Other Ambulatory Visit: Payer: Self-pay | Admitting: Internal Medicine

## 2019-05-18 DIAGNOSIS — M5416 Radiculopathy, lumbar region: Secondary | ICD-10-CM | POA: Diagnosis not present

## 2019-05-18 DIAGNOSIS — M5136 Other intervertebral disc degeneration, lumbar region: Secondary | ICD-10-CM | POA: Diagnosis not present

## 2019-05-24 ENCOUNTER — Encounter: Payer: Self-pay | Admitting: Oncology

## 2019-05-24 ENCOUNTER — Other Ambulatory Visit: Payer: Self-pay

## 2019-05-24 DIAGNOSIS — M1710 Unilateral primary osteoarthritis, unspecified knee: Secondary | ICD-10-CM | POA: Insufficient documentation

## 2019-05-24 NOTE — Progress Notes (Signed)
Patient stated that she gets nauseated 2-3 times a week. Patient also stated that she is not able to sleep at night.

## 2019-05-25 ENCOUNTER — Inpatient Hospital Stay: Payer: Medicare Other | Attending: Oncology

## 2019-05-25 ENCOUNTER — Other Ambulatory Visit: Payer: Self-pay

## 2019-05-25 ENCOUNTER — Inpatient Hospital Stay: Payer: Medicare Other | Admitting: Oncology

## 2019-05-25 VITALS — BP 126/76 | HR 64 | Temp 96.9°F | Resp 16 | Wt 235.9 lb

## 2019-05-25 DIAGNOSIS — R1012 Left upper quadrant pain: Secondary | ICD-10-CM

## 2019-05-25 DIAGNOSIS — Z87891 Personal history of nicotine dependence: Secondary | ICD-10-CM | POA: Insufficient documentation

## 2019-05-25 DIAGNOSIS — G4730 Sleep apnea, unspecified: Secondary | ICD-10-CM | POA: Insufficient documentation

## 2019-05-25 DIAGNOSIS — E785 Hyperlipidemia, unspecified: Secondary | ICD-10-CM | POA: Diagnosis not present

## 2019-05-25 DIAGNOSIS — Z806 Family history of leukemia: Secondary | ICD-10-CM | POA: Insufficient documentation

## 2019-05-25 DIAGNOSIS — Z833 Family history of diabetes mellitus: Secondary | ICD-10-CM | POA: Diagnosis not present

## 2019-05-25 DIAGNOSIS — R448 Other symptoms and signs involving general sensations and perceptions: Secondary | ICD-10-CM

## 2019-05-25 DIAGNOSIS — Z8249 Family history of ischemic heart disease and other diseases of the circulatory system: Secondary | ICD-10-CM | POA: Insufficient documentation

## 2019-05-25 DIAGNOSIS — Z8349 Family history of other endocrine, nutritional and metabolic diseases: Secondary | ICD-10-CM | POA: Diagnosis not present

## 2019-05-25 DIAGNOSIS — M1990 Unspecified osteoarthritis, unspecified site: Secondary | ICD-10-CM | POA: Diagnosis not present

## 2019-05-25 DIAGNOSIS — I872 Venous insufficiency (chronic) (peripheral): Secondary | ICD-10-CM | POA: Diagnosis not present

## 2019-05-25 DIAGNOSIS — D75839 Thrombocytosis, unspecified: Secondary | ICD-10-CM

## 2019-05-25 DIAGNOSIS — I5032 Chronic diastolic (congestive) heart failure: Secondary | ICD-10-CM | POA: Diagnosis not present

## 2019-05-25 DIAGNOSIS — R6883 Chills (without fever): Secondary | ICD-10-CM | POA: Diagnosis not present

## 2019-05-25 DIAGNOSIS — Z880 Allergy status to penicillin: Secondary | ICD-10-CM | POA: Diagnosis not present

## 2019-05-25 DIAGNOSIS — Z885 Allergy status to narcotic agent status: Secondary | ICD-10-CM | POA: Diagnosis not present

## 2019-05-25 DIAGNOSIS — R7989 Other specified abnormal findings of blood chemistry: Secondary | ICD-10-CM | POA: Insufficient documentation

## 2019-05-25 DIAGNOSIS — Z803 Family history of malignant neoplasm of breast: Secondary | ICD-10-CM | POA: Diagnosis not present

## 2019-05-25 DIAGNOSIS — Z79899 Other long term (current) drug therapy: Secondary | ICD-10-CM | POA: Insufficient documentation

## 2019-05-25 DIAGNOSIS — D509 Iron deficiency anemia, unspecified: Secondary | ICD-10-CM | POA: Diagnosis not present

## 2019-05-25 DIAGNOSIS — Z881 Allergy status to other antibiotic agents status: Secondary | ICD-10-CM | POA: Insufficient documentation

## 2019-05-25 DIAGNOSIS — D473 Essential (hemorrhagic) thrombocythemia: Secondary | ICD-10-CM | POA: Diagnosis not present

## 2019-05-25 DIAGNOSIS — E538 Deficiency of other specified B group vitamins: Secondary | ICD-10-CM | POA: Insufficient documentation

## 2019-05-25 DIAGNOSIS — K5900 Constipation, unspecified: Secondary | ICD-10-CM | POA: Insufficient documentation

## 2019-05-25 DIAGNOSIS — R5382 Chronic fatigue, unspecified: Secondary | ICD-10-CM | POA: Diagnosis not present

## 2019-05-25 LAB — CBC WITH DIFFERENTIAL/PLATELET
Abs Immature Granulocytes: 0.03 10*3/uL (ref 0.00–0.07)
Basophils Absolute: 0.1 10*3/uL (ref 0.0–0.1)
Basophils Relative: 1 %
Eosinophils Absolute: 0.2 10*3/uL (ref 0.0–0.5)
Eosinophils Relative: 3 %
HCT: 39.7 % (ref 36.0–46.0)
Hemoglobin: 12.8 g/dL (ref 12.0–15.0)
Immature Granulocytes: 0 %
Lymphocytes Relative: 38 %
Lymphs Abs: 3.5 10*3/uL (ref 0.7–4.0)
MCH: 28.4 pg (ref 26.0–34.0)
MCHC: 32.2 g/dL (ref 30.0–36.0)
MCV: 88.2 fL (ref 80.0–100.0)
Monocytes Absolute: 0.9 10*3/uL (ref 0.1–1.0)
Monocytes Relative: 10 %
Neutro Abs: 4.4 10*3/uL (ref 1.7–7.7)
Neutrophils Relative %: 48 %
Platelets: 466 10*3/uL — ABNORMAL HIGH (ref 150–400)
RBC: 4.5 MIL/uL (ref 3.87–5.11)
RDW: 13.2 % (ref 11.5–15.5)
WBC: 9.1 10*3/uL (ref 4.0–10.5)
nRBC: 0 % (ref 0.0–0.2)

## 2019-05-25 LAB — IRON AND TIBC
Iron: 58 ug/dL (ref 28–170)
Saturation Ratios: 16 % (ref 10.4–31.8)
TIBC: 374 ug/dL (ref 250–450)
UIBC: 316 ug/dL

## 2019-05-25 LAB — FERRITIN: Ferritin: 71 ng/mL (ref 11–307)

## 2019-05-25 NOTE — Progress Notes (Signed)
Episodes of LUQ pain for the past 2 months.

## 2019-05-25 NOTE — Progress Notes (Signed)
Hematology/Oncology follow up note [facility] [phone] [phone]   Patient Care Team: Einar Pheasant, MD as PCP - General (Internal Medicine)  REFERRING PROVIDER: Einar Pheasant, MD CHIEF COMPLAINTS/REASON FOR VISIT:  Evaluation of thrombocytosis and iron deficiency.  HISTORY OF PRESENTING ILLNESS:  Nicole Sims is a  68 y.o.  female with PMH listed below who was referred to me for evaluation of thromcbocytosis  Reviewed patient's labs which were obtained by PCP.  09/12/2018 labs showed elevated platelet counts at 529,000, Wbc 9.6 and hemoglobin 13.3  Reviewed patient's previous labs. Thrombocytosis onset is chronic onset , duration since at least 2014. No aggravating or elevated factors. Associated symptoms or signs:  Denies weight loss, fever, chills, fatigue, night sweats.  Context:  Smoking history: Denies Family history of polycythemia.  Denies History of iron deficiency anemia denies History of DVT denies She feels nauseated.  Scheduled for endoscopy in March 2020.  #Previous work-up showed  ak2 V617F mutation, CARL, MPL, E12-15 negative. Peripheral blood smear showed normal WBC morphology platelet appears increased.  Platelet varies in size with large body platelets noted. Iron panel showed TIBC 413, saturation 16, ferritin 30.  Received Venofer IV 200 mg x 2  # 01/22/2019 CT angios chest PE showed no evidence of PE.  INTERVAL HISTORY LYLIANAH Sims is a 68 y.o. female who has above history reviewed by me today presents for follow up visit for management of thrombocytosis and iron deficiency.   Patient reports doing well.  She had noticed left upper quadrant pain, intermittent, for the past 2 months.  Worsening with deep breath.  No alleviating factors.  Severity 6 out of 10, sometimes can last for 1 day.  Currently she rates the pain as 1 out of 10. She recalls that she had a fall recently.  Cannot remember the details  of the fall. Other than that, reports no new complaints. History of iron deficiency, she cannot tolerate oral iron supplementation.  Previously received IV iron.  Chronic fatigue is stable. Appetite is good. Has gained weight She reports occasionally, she has some " chills", feels cold sometimes. Temperature is in high 90s, usually not above 100.    Review of Systems  Constitutional: Positive for fatigue. Negative for appetite change, chills and fever.  HENT:   Negative for hearing loss and voice change.   Eyes: Negative for eye problems.  Respiratory: Positive for shortness of breath. Negative for chest tightness and cough.   Cardiovascular: Negative for chest pain.  Gastrointestinal: Negative for abdominal distention, abdominal pain and blood in stool.  Endocrine: Negative for hot flashes.  Genitourinary: Negative for difficulty urinating and frequency.   Musculoskeletal: Negative for arthralgias.  Skin: Negative for itching and rash.  Neurological: Negative for extremity weakness.  Hematological: Negative for adenopathy.  Psychiatric/Behavioral: Negative for confusion.    MEDICAL HISTORY:  Past Medical History:  Diagnosis Date  . Anemia   . Anxiety   . Asthma   . Cataract cortical, senile   . CHF (congestive heart failure) ([place])   . Chronic headaches   . Depression   . Diastolic heart failure ([place])   . Diverticulosis   . Environmental allergies   . GERD (gastroesophageal reflux disease)   . Heart murmur   . Hyperglycemia   . Hyperlipidemia   . Hypertension   . Obesity   . Osteoarthritis   . Palpitations   . Restless leg syndrome   . Sleep apnea   . Thrombocytosis ([place])   .  Urinary incontinence    mixed  . Venous insufficiency   . Vitamin D deficiency     SURGICAL HISTORY: Past Surgical History:  Procedure Laterality Date  . BLADDER SURGERY     x2   [facility]  . BREAST CYST ASPIRATION Bilateral 2005   approximate year  . CARDIAC  CATHETERIZATION     Nehemiah Massed  . CERVICAL CONE BIOPSY     [facility]  . CHOLECYSTECTOMY    . COLONOSCOPY WITH PROPOFOL N/A 07/19/2016   Procedure: COLONOSCOPY WITH PROPOFOL;  Surgeon: Manya Silvas, MD;  Location[facility] ENDOSCOPY;  Service: Endoscopy;  Laterality: N/A;  . COLONOSCOPY WITH PROPOFOL N/A 10/20/2018   Procedure: COLONOSCOPY WITH PROPOFOL;  Surgeon: Lollie Sails, MD;  Location: [facility] ENDOSCOPY;  Service: Endoscopy;  Laterality: N/A;  . ESOPHAGOGASTRODUODENOSCOPY (EGD) WITH PROPOFOL N/A 07/19/2016   Procedure: ESOPHAGOGASTRODUODENOSCOPY (EGD) WITH PROPOFOL;  Surgeon: Manya Silvas, MD;  Location: [facility] ENDOSCOPY;  Service: Endoscopy;  Laterality: N/A;  . FLEXIBLE SIGMOIDOSCOPY    . KNEE ARTHROSCOPY  08/13/08  . knee replacement and revision     left  . RECTOCELE REPAIR    . RIGHT/LEFT HEART CATH AND CORONARY ANGIOGRAPHY Bilateral 04/10/2018   Procedure: RIGHT/LEFT HEART CATH AND CORONARY ANGIOGRAPHY;  Surgeon: Wellington Hampshire, MD;  Location: [facility];  Service: Cardiovascular;  Laterality: Bilateral;  . ROTATOR CUFF REPAIR     bilateral  . SHOULDER SURGERY  11/17/05  . TONSILLECTOMY  1962  . VAGINAL HYSTERECTOMY  1974   abnormal pap and carcinoma in situ    SOCIAL HISTORY: Social History   Socioeconomic History  . Marital status: Married    Spouse name: Not on file  . Number of children: Not on file  . Years of education: Not on file  . Highest education level: Not on file  Occupational History  . Occupation: retired  Scientific laboratory technician  . Financial resource strain: Not on file  . Food insecurity    Worry: Not on file    Inability: Not on file  . Transportation needs    Medical: Not on file    Non-medical: Not on file  Tobacco Use  . Smoking status: Former Smoker    Quit date: 08/17/1983    Years since quitting: 35.7  . Smokeless tobacco: Never Used  Substance and Sexual Activity  . Alcohol use: Yes    Alcohol/week: 0.0 standard drinks    Comment:  rarely  . Drug use: No  . Sexual activity: Not on file  Lifestyle  . Physical activity    Days per week: Not on file    Minutes per session: Not on file  . Stress: Not on file  Relationships  . Social Herbalist on phone: Not on file    Gets together: Not on file    Attends religious service: Not on file    Active member of club or organization: Not on file    Attends meetings of clubs or organizations: Not on file    Relationship status: Not on file  . Intimate partner violence    Fear of current or ex partner: Not on file    Emotionally abused: Not on file    Physically abused: Not on file    Forced sexual activity: Not on file  Other Topics Concern  . Not on file  Social History Narrative  . Not on file    FAMILY HISTORY: Family History  Problem Relation Age of  Onset  . Diabetes Mellitus II Father   . Thyroid disease Father   . Breast cancer Maternal Aunt   . Hypertension Mother   . Heart Problems Brother   . Leukemia Maternal Aunt   . Colon cancer Neg Hx   . Kidney cancer Neg Hx   . Bladder Cancer Neg Hx     ALLERGIES:  is allergic to augmentin [amoxicillin-pot clavulanate]; bactrim [sulfamethoxazole-trimethoprim]; doxycycline; hydrocodone-acetaminophen; levaquin [levofloxacin]; pseudoephedrine; and shellfish allergy.  MEDICATIONS:  Current Outpatient Medications  Medication Sig Dispense Refill  . albuterol (PROVENTIL HFA;VENTOLIN HFA) 108 (90 BASE) MCG/ACT inhaler Inhale 2 puffs into the lungs every 4 (four) hours as needed. (Patient taking differently: Inhale 2 puffs into the lungs every 4 (four) hours as needed for wheezing or shortness of breath. ) 1 Inhaler 3  . albuterol (PROVENTIL) (2.5 MG/3ML) 0.083% nebulizer solution Take 3 mLs (2.5 mg total) by nebulization every 6 (six) hours as needed for wheezing or shortness of breath. 150 mL 1  . aspirin EC 81 MG tablet Take 81 mg by mouth daily.    . budesonide-formoterol (SYMBICORT) 80-4.5 MCG/ACT  inhaler Inhale 2 puffs into the lungs 2 (two) times daily. 1 Inhaler 3  . Calcium Citrate-Vitamin D (CALCIUM CITRATE + D PO) Take 1 tablet by mouth daily. 500/400    . Cholecalciferol (VITAMIN D-3) 1000 units CAPS Take 1 capsule by mouth daily.     . clobetasol ointment (TEMOVATE) [id number] % Apply 1 application topically 2 (two) times daily.   0  . furosemide (LASIX) 20 MG tablet Take 1 tablet (20 mg total) by mouth daily. 90 tablet 3  . gabapentin (NEURONTIN) 300 MG capsule Take 1 capsule (300 mg total) by mouth at bedtime as needed. 30 capsule 1  . magnesium oxide (MAG-OX) 400 MG tablet Take 400 mg daily by mouth.    . metoprolol succinate (TOPROL-XL) 100 MG 24 hr tablet Take 1 tablet (100 mg total) by mouth daily. Take with or immediately following a meal. 90 tablet 3  . montelukast (SINGULAIR) 10 MG tablet TAKE 1 TABLET BY MOUTH AT  BEDTIME 90 tablet 3  . Multiple Vitamin (MULTIVITAMIN) tablet Take 1 tablet by mouth daily.    . NONFORMULARY OR COMPOUNDED ITEM Place 1 spray into the nose 2 (two) times daily. Budesonide + Saline Irrigation/Rinse    . potassium chloride (K-DUR) 10 MEQ tablet Take 1 tablet (10 mEq total) by mouth daily. 90 tablet 3  . RABEprazole (ACIPHEX) 20 MG tablet Take 20 mg by mouth 2 (two) times daily.    . rosuvastatin (CRESTOR) 10 MG tablet TAKE 1 TABLET BY MOUTH  DAILY 90 tablet 1  . venlafaxine XR (EFFEXOR-XR) 150 MG 24 hr capsule TAKE 1 CAPSULE BY MOUTH  DAILY WITH BREAKFAST 90 capsule 3  . linaclotide (LINZESS) 290 MCG CAPS capsule Take 290 mcg by mouth daily as needed.     . naproxen sodium (ALEVE) 220 MG tablet Take 220-440 mg by mouth daily as needed (pain).    . ondansetron (ZOFRAN) 4 MG tablet Take 1 tablet (4 mg total) by mouth 2 (two) times daily as needed for nausea or vomiting. (Patient not taking: Reported on 05/24/2019) 15 tablet 0  . rOPINIRole (REQUIP) 2 MG tablet Take 2 mg by mouth daily.   1  . sucralfate (CARAFATE) 1 g tablet Take 1 g by mouth daily.     .  vitamin B-12 (CYANOCOBALAMIN) 1000 MCG tablet Take 1,000 mcg by mouth daily.  No current facility-administered medications for this visit.      PHYSICAL EXAMINATION: ECOG PERFORMANCE STATUS: 1 - Symptomatic but completely ambulatory Vitals:   05/25/19 0946  BP: 126/76  Pulse: 64  Resp: 16  Temp: (!) 96.9 F (36.1 C)   Filed Weights   05/25/19 0946  Weight: 235 lb 14.4 oz (107 kg)    Physical Exam Constitutional:      General: She is not in acute distress. HENT:     Head: Normocephalic and atraumatic.  Eyes:     General: No scleral icterus.    Pupils: Pupils are equal, round, and reactive to light.  Neck:     Musculoskeletal: Normal range of motion and neck supple.  Cardiovascular:     Rate and Rhythm: Normal rate and regular rhythm.     Heart sounds: Normal heart sounds.  Pulmonary:     Effort: Pulmonary effort is normal. No respiratory distress.     Breath sounds: No wheezing.  Abdominal:     General: Bowel sounds are normal. There is no distension.     Palpations: Abdomen is soft. There is no mass.     Tenderness: There is no abdominal tenderness.  Musculoskeletal: Normal range of motion.        General: No deformity.     Comments: Left rib cage palpable tenderness  Skin:    General: Skin is warm and dry.     Findings: No erythema or rash.  Neurological:     Mental Status: She is alert and oriented to person, place, and time.     Cranial Nerves: No cranial nerve deficit.     Coordination: Coordination normal.  Psychiatric:        Behavior: Behavior normal.        Thought Content: Thought content normal.      LABORATORY DATA:  I have reviewed the data as listed Lab Results  Component Value Date   WBC 9.1 05/25/2019   HGB 12.8 05/25/2019   HCT 39.7 05/25/2019   MCV 88.2 05/25/2019   PLT 466 (H) 05/25/2019   Recent Labs    08/25/18 0817 01/03/19 0837 01/09/19 1727 01/16/19 1505 01/19/19 0927  NA 141 140 138 137 140  K 4.0 4.5 3.5 3.7 3.6   CL 105 105 105 106 104  CO2 27 28 21* 23 23  GLUCOSE 100* 97 94 113* 100*  BUN 16 13 17 15 14   CREATININE 0.99 0.95 0.98 0.95 0.92  CALCIUM 9.6 9.4 9.1 8.8* 9.0  GFRNONAA  --   --  59* >60 >60  GFRAA  --   --  >60 >60 >60  PROT 7.8 7.5  --   --   --   ALBUMIN 4.1 4.1  --   --   --   AST 11 13  --   --   --   ALT 10 13  --   --   --   ALKPHOS 90 99  --   --   --   BILITOT 0.5 0.4  --   --   --   BILIDIR 0.1 0.1  --   --   --    Iron/TIBC/Ferritin/ %Sat    Component Value Date/Time   IRON 58 05/25/2019 0932   TIBC 374 05/25/2019 0932   FERRITIN 71 05/25/2019 0932   IRONPCTSAT 16 05/25/2019 0932    RADIOGRAPHIC STUDIES: I have personally reviewed the radiological images as listed and agreed with the findings in the report.  01/22/2019 CT angiogram  chest PE protocol No evidence of pulmonary embolus.  No acute cardiopulmonary disease. No results found.   ASSESSMENT & PLAN:  1. Iron deficiency anemia, unspecified iron deficiency anemia type   2. Left upper quadrant pain   3. Thrombocytosis ([place])   4. Feels cold   Labs are reviewed and discussed with patient. Hemoglobin and iron panel are both stable and are normal. No need for IV iron at this point.  Continue to monitor counts.  Left upper quadrant pain, intermittent, new problem for her.  Physical examination reveals a tender spot on left rib cage.  Considering her recent fall history.,  Possible soft tissue/bony injury secondary to recent fall.  Advised patient to continue monitor.  Observation for now.  If pain gets worse, she needs to seek medical advice.  #Thrombocytosis likely reactive.  Platelet count is 466,000.  Continue to monitor.check BCR-ABL Previous work-up includes negative Jak 2 V6 52F mutation with reflex to CAL R, Jak 2 exon 12-15,MPL gene. Continue to monitor. 02/22/2019 annual mammogram.  Results reviewed   # feels cold, also weight gain and constipation. Recommend patient to have thyroid function to be  measured in the near future. Last TSH was 08/25/2018 and was normal.   Orders Placed This Encounter  Procedures  . CBC with Differential/Platelet    Standing Status:   Future    Standing Expiration Date:   05/24/2020  . Ferritin    Standing Status:   Future    Standing Expiration Date:   05/24/2020  . Iron and TIBC    Standing Status:   Future    Standing Expiration Date:   05/24/2020    All questions were answered. The patient knows to call the clinic with any problems questions or concerns.  Return of visit: 6 months for MD assessment, labs   Earlie Server, MD, PhD

## 2019-05-30 ENCOUNTER — Encounter: Payer: Self-pay | Admitting: Oncology

## 2019-05-31 ENCOUNTER — Other Ambulatory Visit: Payer: Self-pay | Admitting: Oncology

## 2019-05-31 DIAGNOSIS — D75839 Thrombocytosis, unspecified: Secondary | ICD-10-CM

## 2019-05-31 DIAGNOSIS — D473 Essential (hemorrhagic) thrombocythemia: Secondary | ICD-10-CM

## 2019-05-31 NOTE — Progress Notes (Signed)
Called patient and discussed lab results with her.  Stable iron panel., no need for iron infusion.  Long standing thrombocytosis. Recent fall.  Observation for anther 3 months vs bone marrow biopsy option discussed. Patient prefers latter. Will schedule.

## 2019-06-01 ENCOUNTER — Telehealth: Payer: Self-pay

## 2019-06-01 NOTE — Telephone Encounter (Signed)
I called patient to notify her about bone marrow biopsy appt scheduled on 10/26 @ 8:30am. She needs to be there at 7:30am. Patient voiced understanding.

## 2019-06-04 NOTE — Telephone Encounter (Signed)
BM bx scheduled for 06/11/19.  Please schedule MD 1 week later.

## 2019-06-05 NOTE — Progress Notes (Signed)
Patient scheduled for BMB on 06/11/2019, called and gave pre procedure instructions. Questions answered. Aware to be NPO after midnight prior to day of procedure as well as to be here at 0730 on10/26/2020

## 2019-06-08 ENCOUNTER — Other Ambulatory Visit: Payer: Self-pay | Admitting: Physician Assistant

## 2019-06-11 ENCOUNTER — Other Ambulatory Visit: Payer: Self-pay

## 2019-06-11 ENCOUNTER — Ambulatory Visit
Admission: RE | Admit: 2019-06-11 | Discharge: 2019-06-11 | Disposition: A | Discharge status: Home / Self Care | Payer: Medicare Other | Source: Ambulatory Visit | Attending: Oncology | Admitting: Oncology

## 2019-06-11 DIAGNOSIS — D72822 Plasmacytosis: Secondary | ICD-10-CM | POA: Insufficient documentation

## 2019-06-11 DIAGNOSIS — D473 Essential (hemorrhagic) thrombocythemia: Secondary | ICD-10-CM | POA: Diagnosis not present

## 2019-06-11 DIAGNOSIS — D75839 Thrombocytosis, unspecified: Secondary | ICD-10-CM

## 2019-06-11 DIAGNOSIS — D509 Iron deficiency anemia, unspecified: Secondary | ICD-10-CM | POA: Diagnosis not present

## 2019-06-11 LAB — CBC WITH DIFFERENTIAL/PLATELET
Abs Immature Granulocytes: 0.02 10*3/uL (ref 0.00–0.07)
Basophils Absolute: 0.1 10*3/uL (ref 0.0–0.1)
Basophils Relative: 1 %
Eosinophils Absolute: 0.2 10*3/uL (ref 0.0–0.5)
Eosinophils Relative: 2 %
HCT: 42 % (ref 36.0–46.0)
Hemoglobin: 13.3 g/dL (ref 12.0–15.0)
Immature Granulocytes: 0 %
Lymphocytes Relative: 38 %
Lymphs Abs: 2.9 10*3/uL (ref 0.7–4.0)
MCH: 28 pg (ref 26.0–34.0)
MCHC: 31.7 g/dL (ref 30.0–36.0)
MCV: 88.4 fL (ref 80.0–100.0)
Monocytes Absolute: 0.6 10*3/uL (ref 0.1–1.0)
Monocytes Relative: 8 %
Neutro Abs: 3.9 10*3/uL (ref 1.7–7.7)
Neutrophils Relative %: 51 %
Platelets: 482 10*3/uL — ABNORMAL HIGH (ref 150–400)
RBC: 4.75 MIL/uL (ref 3.87–5.11)
RDW: 13.2 % (ref 11.5–15.5)
WBC: 7.6 10*3/uL (ref 4.0–10.5)
nRBC: 0 % (ref 0.0–0.2)

## 2019-06-11 LAB — PROTIME-INR
INR: 1 (ref 0.8–1.2)
Prothrombin Time: 13.1 seconds (ref 11.4–15.2)

## 2019-06-11 MED ORDER — MIDAZOLAM HCL 5 MG/5ML IJ SOLN
INTRAMUSCULAR | Status: AC
  Filled 2019-06-11: qty 5

## 2019-06-11 MED ORDER — SODIUM CHLORIDE 0.9 % IV SOLN
INTRAVENOUS | Status: DC
  Administered 2019-06-11: 20 mL/h via INTRAVENOUS

## 2019-06-11 MED ORDER — MIDAZOLAM HCL 2 MG/2ML IJ SOLN
INTRAMUSCULAR | Status: AC | PRN
  Administered 2019-06-11 (×2): 1 mg via INTRAVENOUS

## 2019-06-11 MED ORDER — FENTANYL CITRATE (PF) 100 MCG/2ML IJ SOLN
INTRAMUSCULAR | Status: AC
  Filled 2019-06-11: qty 2

## 2019-06-11 MED ORDER — FENTANYL CITRATE (PF) 100 MCG/2ML IJ SOLN
INTRAMUSCULAR | Status: AC | PRN
  Administered 2019-06-11 (×2): 50 ug via INTRAVENOUS

## 2019-06-11 MED ORDER — HEPARIN SOD (PORK) LOCK FLUSH 100 UNIT/ML IV SOLN
INTRAVENOUS | Status: AC
  Filled 2019-06-11: qty 5

## 2019-06-11 NOTE — Procedures (Signed)
Interventional Radiology Procedure Note  Procedure: CT guided bone marrow aspiration and biopsy  Complications: None  EBL: < 10 mL  Findings: Aspirate and core biopsy performed of bone marrow in right iliac bone.  Plan: Bedrest supine x 1 hrs  Dillin Lofgren T. Matelyn Antonelli, M.D Pager:  [phone]   

## 2019-06-11 NOTE — Progress Notes (Signed)
Patient clinically stable post BMB per Dr Kathlene Cote, tolerated well. Alert/awake and oriented post procedure. Denies complaints at this time. Received Versed 2mg  along with Fentanyl 186mcg for procedure. bandade dressing dry and intact to sacral area. To specials to finish recovery post procedure. Report given to [employer].

## 2019-06-11 NOTE — Discharge Instructions (Signed)
Moderate Conscious Sedation, Adult, Care After °These instructions provide you with information about caring for yourself after your procedure. Your health care provider may also give you more specific instructions. Your treatment has been planned according to current medical practices, but problems sometimes occur. Call your health care provider if you have any problems or questions after your procedure. °What can I expect after the procedure? °After your procedure, it is common: °· To feel sleepy for several hours. °· To feel clumsy and have poor balance for several hours. °· To have poor judgment for several hours. °· To vomit if you eat too soon. °Follow these instructions at home: °For at least 24 hours after the procedure: ° °· Do not: °? Participate in activities where you could fall or become injured. °? Drive. °? Use heavy machinery. °? Drink alcohol. °? Take sleeping pills or medicines that cause drowsiness. °? Make important decisions or sign legal documents. °? Take care of children on your own. °· Rest. °Eating and drinking °· Follow the diet recommended by your health care provider. °· If you vomit: °? Drink water, juice, or soup when you can drink without vomiting. °? Make sure you have little or no nausea before eating solid foods. °General instructions °· Have a responsible adult stay with you until you are awake and alert. °· Take over-the-counter and prescription medicines only as told by your health care provider. °· If you smoke, do not smoke without supervision. °· Keep all follow-up visits as told by your health care provider. This is important. °Contact a health care provider if: °· You keep feeling nauseous or you keep vomiting. °· You feel light-headed. °· You develop a rash. °· You have a fever. °Get help right away if: °· You have trouble breathing. °This information is not intended to replace advice given to you by your health care provider. Make sure you discuss any questions you have  with your health care provider. °Document Released: 05/23/2013 Document Revised: 07/15/2017 Document Reviewed: 11/22/2015 °Elsevier Patient Education © 2020 [employer] °Needle Biopsy, Care After °These instructions tell you how to care for yourself after your procedure. Your doctor may also give you more specific instructions. Call your doctor if you have any problems or questions. °What can I expect after the procedure? °After the procedure, it is common to have: °· Soreness. °· Bruising. °· Mild pain. °Follow these instructions at home: ° °· Return to your normal activities as told by your doctor. Ask your doctor what activities are safe for you. °· Take over-the-counter and prescription medicines only as told by your doctor. °· Wash your hands with soap and water before you change your bandage (dressing). If you cannot use soap and water, use hand sanitizer. °· Follow instructions from your doctor about: °? How to take care of your puncture site. °? When and how to change your bandage. °? When to remove your bandage. °· Check your puncture site every day for signs of infection. Watch for: °? Redness, swelling, or pain. °? Fluid or blood.  °? Pus or a bad smell. °? Warmth. °· Do not take baths, swim, or use a hot tub until your doctor approves. Ask your doctor if you may take showers. You may only be allowed to take sponge baths. °· Keep all follow-up visits as told by your doctor. This is important. °Contact a doctor if you have: °· A fever. °· Redness, swelling, or pain at the puncture site, and it lasts longer than a few   days. °· Fluid, blood, or pus coming from the puncture site. °· Warmth coming from the puncture site. °Get help right away if: °· You have a lot of bleeding from the puncture site. °Summary °· After the procedure, it is common to have soreness, bruising, or mild pain at the puncture site. °· Check your puncture site every day for signs of infection, such as redness, swelling, or pain. °· Get  help right away if you have severe bleeding from your puncture site. °This information is not intended to replace advice given to you by your health care provider. Make sure you discuss any questions you have with your health care provider. °Document Released: 07/15/2008 Document Revised: 08/15/2017 Document Reviewed: 08/15/2017 °Elsevier Patient Education © 2020 [employer] ° °

## 2019-06-13 LAB — SURGICAL PATHOLOGY

## 2019-06-15 ENCOUNTER — Other Ambulatory Visit: Payer: Self-pay

## 2019-06-18 ENCOUNTER — Encounter (HOSPITAL_COMMUNITY): Payer: Self-pay | Admitting: Oncology

## 2019-06-18 ENCOUNTER — Inpatient Hospital Stay: Payer: Medicare Other | Attending: Oncology | Admitting: Oncology

## 2019-06-18 ENCOUNTER — Other Ambulatory Visit: Payer: Self-pay

## 2019-06-18 ENCOUNTER — Encounter: Payer: Self-pay | Admitting: Oncology

## 2019-06-18 VITALS — BP 126/83 | HR 67 | Temp 98.2°F | Resp 18 | Wt 237.0 lb

## 2019-06-18 DIAGNOSIS — Z87891 Personal history of nicotine dependence: Secondary | ICD-10-CM | POA: Diagnosis not present

## 2019-06-18 DIAGNOSIS — D509 Iron deficiency anemia, unspecified: Secondary | ICD-10-CM | POA: Diagnosis not present

## 2019-06-18 DIAGNOSIS — I872 Venous insufficiency (chronic) (peripheral): Secondary | ICD-10-CM | POA: Diagnosis not present

## 2019-06-18 DIAGNOSIS — Z806 Family history of leukemia: Secondary | ICD-10-CM | POA: Diagnosis not present

## 2019-06-18 DIAGNOSIS — Z79899 Other long term (current) drug therapy: Secondary | ICD-10-CM | POA: Insufficient documentation

## 2019-06-18 DIAGNOSIS — Z803 Family history of malignant neoplasm of breast: Secondary | ICD-10-CM | POA: Insufficient documentation

## 2019-06-18 DIAGNOSIS — I10 Essential (primary) hypertension: Secondary | ICD-10-CM | POA: Insufficient documentation

## 2019-06-18 DIAGNOSIS — D75839 Thrombocytosis, unspecified: Secondary | ICD-10-CM

## 2019-06-18 DIAGNOSIS — Z885 Allergy status to narcotic agent status: Secondary | ICD-10-CM | POA: Insufficient documentation

## 2019-06-18 DIAGNOSIS — R7989 Other specified abnormal findings of blood chemistry: Secondary | ICD-10-CM | POA: Insufficient documentation

## 2019-06-18 DIAGNOSIS — Z881 Allergy status to other antibiotic agents status: Secondary | ICD-10-CM | POA: Insufficient documentation

## 2019-06-18 DIAGNOSIS — Z8349 Family history of other endocrine, nutritional and metabolic diseases: Secondary | ICD-10-CM | POA: Diagnosis not present

## 2019-06-18 DIAGNOSIS — K219 Gastro-esophageal reflux disease without esophagitis: Secondary | ICD-10-CM | POA: Diagnosis not present

## 2019-06-18 DIAGNOSIS — E785 Hyperlipidemia, unspecified: Secondary | ICD-10-CM | POA: Insufficient documentation

## 2019-06-18 DIAGNOSIS — Z8249 Family history of ischemic heart disease and other diseases of the circulatory system: Secondary | ICD-10-CM | POA: Diagnosis not present

## 2019-06-18 DIAGNOSIS — Z833 Family history of diabetes mellitus: Secondary | ICD-10-CM | POA: Diagnosis not present

## 2019-06-18 DIAGNOSIS — R5383 Other fatigue: Secondary | ICD-10-CM | POA: Insufficient documentation

## 2019-06-18 DIAGNOSIS — D473 Essential (hemorrhagic) thrombocythemia: Secondary | ICD-10-CM | POA: Diagnosis not present

## 2019-06-18 DIAGNOSIS — Z880 Allergy status to penicillin: Secondary | ICD-10-CM | POA: Diagnosis not present

## 2019-06-18 NOTE — Progress Notes (Signed)
Hematology/Oncology follow up note [facility] [phone] [phone]   Patient Care Team: Einar Pheasant, MD as PCP - General (Internal Medicine)  REFERRING PROVIDER: Einar Pheasant, MD CHIEF COMPLAINTS/REASON FOR VISIT:  Evaluation of thrombocytosis and iron deficiency.  HISTORY OF PRESENTING ILLNESS:  Nicole Sims is a  68 y.o.  female with PMH listed below who was referred to me for evaluation of thromcbocytosis  Reviewed patient's labs which were obtained by PCP.  09/12/2018 labs showed elevated platelet counts at 529,000, Wbc 9.6 and hemoglobin 13.3  Reviewed patient's previous labs. Thrombocytosis onset is chronic onset , duration since at least 2014. No aggravating or elevated factors. Associated symptoms or signs:  Denies weight loss, fever, chills, fatigue, night sweats.  Context:  Smoking history: Denies Family history of polycythemia.  Denies History of iron deficiency anemia denies History of DVT denies She feels nauseated.  Scheduled for endoscopy in March 2020.  #Previous work-up showed  ak2 V617F mutation, CARL, MPL, E12-15 negative. Peripheral blood smear showed normal WBC morphology platelet appears increased.  Platelet varies in size with large body platelets noted. Iron panel showed TIBC 413, saturation 16, ferritin 30.  Received Venofer IV 200 mg x 2  # 01/22/2019 CT angios chest PE showed no evidence of PE.  INTERVAL HISTORY Nicole Sims is a 68 y.o. female who has above history reviewed by me today presents for follow up visit for management of thrombocytosis   #For iron panel has been stable.  For work-up of thrombocytosis, patient underwent bone marrow biopsy for further evaluation. Present to discuss reports Denies any additional new complaints. Chronic fatigue at baseline.  Denies any shortness of breath    Review of Systems  Constitutional: Positive for fatigue. Negative for appetite change,  chills and fever.  HENT:   Negative for hearing loss and voice change.   Eyes: Negative for eye problems.  Respiratory: Negative for chest tightness, cough and shortness of breath.   Cardiovascular: Negative for chest pain.  Gastrointestinal: Negative for abdominal distention, abdominal pain and blood in stool.  Endocrine: Negative for hot flashes.  Genitourinary: Negative for difficulty urinating and frequency.   Musculoskeletal: Negative for arthralgias.  Skin: Negative for itching and rash.  Neurological: Negative for extremity weakness.  Hematological: Negative for adenopathy.  Psychiatric/Behavioral: Negative for confusion.    MEDICAL HISTORY:  Past Medical History:  Diagnosis Date  . Anemia   . Anxiety   . Asthma   . Cataract cortical, senile   . CHF (congestive heart failure) ([place])   . Chronic headaches   . Diastolic heart failure ([place])   . Diverticulosis   . Environmental allergies   . GERD (gastroesophageal reflux disease)   . Heart murmur   . Hyperglycemia   . Hyperlipidemia   . Hypertension   . Obesity   . Osteoarthritis   . Palpitations   . Restless leg syndrome   . Sleep apnea   . Thrombocytosis ([place])   . Urinary incontinence    mixed  . Venous insufficiency   . Vitamin D deficiency     SURGICAL HISTORY: Past Surgical History:  Procedure Laterality Date  . BLADDER SURGERY     x2   [facility]  . BREAST CYST ASPIRATION Bilateral 2005   approximate year  . CARDIAC CATHETERIZATION     [facility]hem[facility] Massed  . CERVICAL CONE BIOPSY     [facility]  . CHOLECYSTECTOMY    . COLONOSCOPY WITH PROPOFOL N/A 07/19/2016  Procedure: COLONOSCOPY WITH PROPOFOL;  Surgeon: Manya Silvas, MD;  Location[facility] ENDOSCOPY;  Service: Endoscopy;  Laterality: N/A;  . COLONOSCOPY WITH PROPOFOL N/A 10/20/2018   Procedure: COLONOSCOPY WITH PROPOFOL;  Surgeon: Lollie Sails, MD;  Location: [facility] ENDOSCOPY;  Service: Endoscopy;  Laterality: N/A;  . ESOPHAGOGASTRODUODENOSCOPY (EGD)  WITH PROPOFOL N/A 07/19/2016   Procedure: ESOPHAGOGASTRODUODENOSCOPY (EGD) WITH PROPOFOL;  Surgeon: Manya Silvas, MD;  Location: [facility] ENDOSCOPY;  Service: Endoscopy;  Laterality: N/A;  . FLEXIBLE SIGMOIDOSCOPY    . KNEE ARTHROSCOPY  08/13/08  . knee replacement and revision     left  . RECTOCELE REPAIR    . RIGHT/LEFT HEART CATH AND CORONARY ANGIOGRAPHY Bilateral 04/10/2018   Procedure: RIGHT/LEFT HEART CATH AND CORONARY ANGIOGRAPHY;  Surgeon: Wellington Hampshire, MD;  Location: [facility] CV LAB;  Service: Cardiovascular;  Laterality: Bilateral;  . ROTATOR CUFF REPAIR     bilateral  . SHOULDER SURGERY  11/17/05  . TONSILLECTOMY  1962  . VAGINAL HYSTERECTOMY  1974   abnormal pap and carcinoma in situ    SOCIAL HISTORY: Social History   Socioeconomic History  . Marital status: Married    Spouse name: Not on file  . Number of children: Not on file  . Years of education: Not on file  . Highest education level: Not on file  Occupational History  . Occupation: retired  Scientific laboratory technician  . Financial resource strain: Not on file  . Food insecurity    Worry: Not on file    Inability: Not on file  . Transportation needs    Medical: Not on file    Non-medical: Not on file  Tobacco Use  . Smoking status: Former Smoker    Quit date: 08/17/1983    Years since quitting: 35.8  . Smokeless tobacco: Never Used  Substance and Sexual Activity  . Alcohol use: Yes    Alcohol/week: 0.0 standard drinks    Comment: rarely  . Drug use: No  . Sexual activity: Not on file  Lifestyle  . Physical activity    Days per week: Not on file    Minutes per session: Not on file  . Stress: Not on file  Relationships  . Social Herbalist on phone: Not on file    Gets together: Not on file    Attends religious service: Not on file    Active member of club or organization: Not on file    Attends meetings of clubs or organizations: Not on file    Relationship status: Not on file  . Intimate  partner violence    Fear of current or ex partner: Not on file    Emotionally abused: Not on file    Physically abused: Not on file    Forced sexual activity: Not on file  Other Topics Concern  . Not on file  Social History Narrative   Lives at home with husband    FAMILY HISTORY: Family History  Problem Relation Age of Onset  . Diabetes Mellitus II Father   . Thyroid disease Father   . Breast cancer Maternal Aunt   . Hypertension Mother   . Heart Problems Brother   . Leukemia Maternal Aunt   . Colon cancer Neg Hx   . Kidney cancer Neg Hx   . Bladder Cancer Neg Hx     ALLERGIES:  is allergic to augmentin [amoxicillin-pot clavulanate]; bactrim [sulfamethoxazole-trimethoprim]; doxycycline; hydrocodone-acetaminophen; levaquin [levofloxacin]; pseudoephedrine; and shellfish allergy.  MEDICATIONS:  Current Outpatient  Medications  Medication Sig Dispense Refill  . albuterol (PROVENTIL HFA;VENTOLIN HFA) 108 (90 BASE) MCG/ACT inhaler Inhale 2 puffs into the lungs every 4 (four) hours as needed. (Patient taking differently: Inhale 2 puffs into the lungs every 4 (four) hours as needed for wheezing or shortness of breath. ) 1 Inhaler 3  . albuterol (PROVENTIL) (2.5 MG/3ML) 0.083% nebulizer solution Take 3 mLs (2.5 mg total) by nebulization every 6 (six) hours as needed for wheezing or shortness of breath. 150 mL 1  . aspirin EC 81 MG tablet Take 81 mg by mouth daily.    . budesonide-formoterol (SYMBICORT) 80-4.5 MCG/ACT inhaler Inhale 2 puffs into the lungs 2 (two) times daily. 1 Inhaler 3  . Calcium Citrate-Vitamin D (CALCIUM CITRATE + D PO) Take 1 tablet by mouth daily. 500/400    . Cholecalciferol (VITAMIN D-3) 1000 units CAPS Take 1 capsule by mouth daily.     . clobetasol ointment (TEMOVATE) 6.38 % Apply 1 application topically 2 (two) times daily.   0  . furosemide (LASIX) 20 MG tablet Take 1 tablet (20 mg total) by mouth daily. 90 tablet 3  . gabapentin (NEURONTIN) 300 MG capsule  Take 1 capsule (300 mg total) by mouth at bedtime as needed. 30 capsule 1  . linaclotide (LINZESS) 290 MCG CAPS capsule Take 290 mcg by mouth daily as needed.     . magnesium oxide (MAG-OX) 400 MG tablet Take 400 mg daily by mouth.    . metoprolol succinate (TOPROL-XL) 100 MG 24 hr tablet Take 1 tablet (100 mg total) by mouth daily. Take with or immediately following a meal. 90 tablet 3  . montelukast (SINGULAIR) 10 MG tablet TAKE 1 TABLET BY MOUTH AT  BEDTIME 90 tablet 3  . Multiple Vitamin (MULTIVITAMIN) tablet Take 1 tablet by mouth daily.    . naproxen sodium (ALEVE) 220 MG tablet Take 220-440 mg by mouth daily as needed (pain).    . ondansetron (ZOFRAN) 4 MG tablet Take 1 tablet (4 mg total) by mouth 2 (two) times daily as needed for nausea or vomiting. 15 tablet 0  . potassium chloride (K-DUR) 10 MEQ tablet Take 1 tablet (10 mEq total) by mouth daily. 90 tablet 3  . RABEprazole (ACIPHEX) 20 MG tablet Take 20 mg by mouth 2 (two) times daily.    . rosuvastatin (CRESTOR) 10 MG tablet TAKE 1 TABLET BY MOUTH  DAILY 90 tablet 1  . sucralfate (CARAFATE) 1 g tablet Take 1 g by mouth daily.     Marland Kitchen venlafaxine XR (EFFEXOR-XR) 150 MG 24 hr capsule TAKE 1 CAPSULE BY MOUTH  DAILY WITH BREAKFAST 90 capsule 3  . vitamin B-12 (CYANOCOBALAMIN) 1000 MCG tablet Take 1,000 mcg by mouth daily.     No current facility-administered medications for this visit.      PHYSICAL EXAMINATION: ECOG PERFORMANCE STATUS: 1 - Symptomatic but completely ambulatory Vitals:   06/18/19 1341  BP: 126/83  Pulse: 67  Resp: 18  Temp: 98.2 F (36.8 C)   Filed Weights   06/18/19 1341  Weight: 237 lb (107.5 kg)    Physical Exam Constitutional:      General: She is not in acute distress. HENT:     Head: Normocephalic and atraumatic.  Eyes:     General: No scleral icterus.    Pupils: Pupils are equal, round, and reactive to light.  Neck:     Musculoskeletal: Normal range of motion and neck supple.  Cardiovascular:      Rate and  Rhythm: Normal rate and regular rhythm.     Heart sounds: Normal heart sounds.  Pulmonary:     Effort: Pulmonary effort is normal. No respiratory distress.     Breath sounds: No wheezing.  Abdominal:     General: Bowel sounds are normal. There is no distension.     Palpations: Abdomen is soft. There is no mass.     Tenderness: There is no abdominal tenderness.  Musculoskeletal: Normal range of motion.        General: No deformity.     Comments: Left rib cage palpable tenderness  Skin:    General: Skin is warm and dry.     Findings: No erythema or rash.  Neurological:     Mental Status: She is alert and oriented to person, place, and time.     Cranial Nerves: No cranial nerve deficit.     Coordination: Coordination normal.  Psychiatric:        Behavior: Behavior normal.        Thought Content: Thought content normal.      LABORATORY DATA:  I have reviewed the data as listed Lab Results  Component Value Date   WBC 7.6 06/11/2019   HGB 13.3 06/11/2019   HCT 42.0 06/11/2019   MCV 88.4 06/11/2019   PLT 482 (H) 06/11/2019   Recent Labs    08/25/18 0817 01/03/19 0837 01/09/19 1727 01/16/19 1505 01/19/19 0927  NA 141 140 138 137 140  K 4.0 4.5 3.5 3.7 3.6  CL 105 105 105 106 104  CO2 27 28 21* 23 23  GLUCOSE 100* 97 94 113* 100*  BUN '16 13 17 15 14  '$ CREATININE 0.99 0.95 0.98 0.95 0.92  CALCIUM 9.6 9.4 9.1 8.8* 9.0  GFRNONAA  --   --  59* >60 >60  GFRAA  --   --  >60 >60 >60  PROT 7.8 7.5  --   --   --   ALBUMIN 4.1 4.1  --   --   --   AST 11 13  --   --   --   ALT 10 13  --   --   --   ALKPHOS 90 99  --   --   --   BILITOT 0.5 0.4  --   --   --   BILIDIR 0.1 0.1  --   --   --    Iron/TIBC/Ferritin/ %Sat    Component Value Date/Time   IRON 58 05/25/2019 0932   TIBC 374 05/25/2019 0932   FERRITIN 71 05/25/2019 0932   IRONPCTSAT 16 05/25/2019 0932    RADIOGRAPHIC STUDIES: I have personally reviewed the radiological images as listed and agreed  with the findings in the report. 01/22/2019 CT angiogram  chest PE protocol No evidence of pulmonary embolus.  No acute cardiopulmonary disease. Ct Bone Marrow Biopsy & Aspiration  Result Date: 06/11/2019 CLINICAL DATA:  Thrombocytosis and iron deficiency anemia. Bone marrow biopsy requested for further hematologic workup. EXAM: CT GUIDED BONE MARROW ASPIRATION AND BIOPSY ANESTHESIA/SEDATION: Versed 2.0 mg IV, Fentanyl 100 mcg IV Total Moderate Sedation Time:   15 minutes. The patient's level of consciousness and physiologic status were continuously monitored during the procedure by Radiology nursing. PROCEDURE: The procedure risks, benefits, and alternatives were explained to the patient. Questions regarding the procedure were encouraged and answered. The patient understands and consents to the procedure. A time out was performed prior to initiating the procedure. The right gluteal region was prepped with chlorhexidine.  Sterile gown and sterile gloves were used for the procedure. Local anesthesia was provided with 1% Lidocaine. Under CT guidance, an 11 gauge On Control bone cutting needle was advanced from a posterior approach into the right iliac bone. Needle positioning was confirmed with CT. Initial non heparinized and heparinized aspirate samples were obtained of bone marrow. Core biopsy was performed via the On Control drill needle. COMPLICATIONS: None FINDINGS: Inspection of initial aspirate did reveal visible particles. Intact core biopsy sample was obtained. IMPRESSION: CT guided bone marrow biopsy of right posterior iliac bone with both aspirate and core samples obtained. Electronically Signed   By: Aletta Edouard M.D.   On: 06/11/2019 09:58     ASSESSMENT & PLAN:  1. Thrombocytosis ([place])   Labs are reviewed and discussed with patient. No iron deficiency.  Hemoglobin has been stable. Given that patient has been chronically persistent having thrombocytosis, decision was made to proceed with  bone marrow biopsy for further evaluation. Bone marrow report was discussed with patient. Normocellular marrow with trilineage hematopoiesis.  Mild polytypic plasmacytosis 5 to 10%. Bone marrow aspirate flow cytometry no monoclonal B-cell or phenotypically aberrant T-cell population was identified. Discussed with patient that thrombocytosis most likely is reactive, secondary to chronic inflammation. Recommend patient to follow-up in 1 year. She may continue to take multivitamin which has iron supplementation unit. Orders Placed This Encounter  Procedures  . CBC with Differential/Platelet    Standing Status:   Future    Standing Expiration Date:   06/17/2021  . Ferritin    Standing Status:   Future    Standing Expiration Date:   06/17/2021  . Iron and TIBC    Standing Status:   Future    Standing Expiration Date:   06/17/2021    All questions were answered. The patient knows to call the clinic with any problems questions or concerns.  Return of visit:  1 year  Earlie Server, MD, PhD

## 2019-06-19 ENCOUNTER — Encounter: Payer: Self-pay | Admitting: Internal Medicine

## 2019-06-20 NOTE — Telephone Encounter (Signed)
Pt screened and scheduled for tomorrow

## 2019-06-21 ENCOUNTER — Other Ambulatory Visit: Payer: Self-pay

## 2019-06-21 ENCOUNTER — Ambulatory Visit (INDEPENDENT_AMBULATORY_CARE_PROVIDER_SITE_OTHER): Payer: Medicare Other | Admitting: Internal Medicine

## 2019-06-21 ENCOUNTER — Ambulatory Visit
Admission: RE | Admit: 2019-06-21 | Discharge: 2019-06-21 | Disposition: A | Discharge status: Home / Self Care | Payer: Medicare Other | Source: Ambulatory Visit | Attending: Internal Medicine | Admitting: Internal Medicine

## 2019-06-21 DIAGNOSIS — E782 Mixed hyperlipidemia: Secondary | ICD-10-CM

## 2019-06-21 DIAGNOSIS — K219 Gastro-esophageal reflux disease without esophagitis: Secondary | ICD-10-CM

## 2019-06-21 DIAGNOSIS — I10 Essential (primary) hypertension: Secondary | ICD-10-CM

## 2019-06-21 DIAGNOSIS — D473 Essential (hemorrhagic) thrombocythemia: Secondary | ICD-10-CM | POA: Diagnosis not present

## 2019-06-21 DIAGNOSIS — R0781 Pleurodynia: Secondary | ICD-10-CM | POA: Diagnosis not present

## 2019-06-21 DIAGNOSIS — M2550 Pain in unspecified joint: Secondary | ICD-10-CM

## 2019-06-21 DIAGNOSIS — D75839 Thrombocytosis, unspecified: Secondary | ICD-10-CM

## 2019-06-21 DIAGNOSIS — R739 Hyperglycemia, unspecified: Secondary | ICD-10-CM | POA: Diagnosis not present

## 2019-06-21 DIAGNOSIS — D509 Iron deficiency anemia, unspecified: Secondary | ICD-10-CM

## 2019-06-21 DIAGNOSIS — D649 Anemia, unspecified: Secondary | ICD-10-CM | POA: Diagnosis not present

## 2019-06-21 DIAGNOSIS — G4733 Obstructive sleep apnea (adult) (pediatric): Secondary | ICD-10-CM

## 2019-06-21 DIAGNOSIS — I5030 Unspecified diastolic (congestive) heart failure: Secondary | ICD-10-CM

## 2019-06-21 LAB — BASIC METABOLIC PANEL
BUN: 13 mg/dL (ref 6–23)
CO2: 28 mEq/L (ref 19–32)
Calcium: 9.6 mg/dL (ref 8.4–10.5)
Chloride: 104 mEq/L (ref 96–112)
Creatinine, Ser: 0.9 mg/dL (ref 0.40–1.20)
GFR: 62.05 mL/min (ref >60.00)
Glucose, Bld: 102 mg/dL — ABNORMAL HIGH (ref 70–99)
Potassium: 4.8 mEq/L (ref 3.5–5.1)
Sodium: 140 mEq/L (ref 135–145)

## 2019-06-21 LAB — LIPID PANEL
Cholesterol: 218 mg/dL — ABNORMAL HIGH (ref 0–200)
HDL: 53.9 mg/dL (ref >39.00)
LDL Cholesterol: 131 mg/dL — ABNORMAL HIGH (ref 0–99)
NonHDL: 164.15
Total CHOL/HDL Ratio: 4
Triglycerides: 166 mg/dL — ABNORMAL HIGH (ref 0.0–149.0)
VLDL: 33.2 mg/dL (ref 0.0–40.0)

## 2019-06-21 LAB — HEPATIC FUNCTION PANEL
ALT: 15 U/L (ref 0–35)
AST: 15 U/L (ref 0–37)
Albumin: 4.3 g/dL (ref 3.5–5.2)
Alkaline Phosphatase: 93 U/L (ref 39–117)
Bilirubin, Direct: 0.1 mg/dL (ref 0.0–0.3)
Total Bilirubin: 0.4 mg/dL (ref 0.2–1.2)
Total Protein: 7.4 g/dL (ref 6.0–8.3)

## 2019-06-21 LAB — C-REACTIVE PROTEIN: CRP: <1 mg/dL (ref 0.5–20.0)

## 2019-06-21 LAB — TSH: TSH: 2.05 u[IU]/mL (ref 0.35–4.50)

## 2019-06-21 LAB — HEMOGLOBIN A1C: Hgb A1c MFr Bld: 6 % (ref 4.6–6.5)

## 2019-06-21 LAB — SEDIMENTATION RATE: Sed Rate: 24 mm/hr (ref 0–30)

## 2019-06-21 NOTE — Patient Instructions (Signed)
Increase aciphex to twice a day.

## 2019-06-21 NOTE — Progress Notes (Signed)
Patient ID: Nicole Sims, female   DOB: [birth year], 68 y.o.   MRN: [id number]   Subjective:    Patient ID: Nicole Sims, female    DOB: 04-21-1951, 68 y.o.   MRN: [id number]  HPI  Patient here for work in appt.  She is concerned regarding "inflammation".  Was recently evaluated by hematology.  S/p bone marrow biopsy.  It was determined that her elevated platelet count was - reactive thrombocytosis.  She comes in with concerns regarding increased joint aches and pains.  Has been persistent.  Wants rheumatological w/up to look for inflammation.  No fever. No headache.  No chest pain.  Breathing stable.  No acid reflux.  No abdominal pain.  Bowels moving.     Past Medical History:  Diagnosis Date  . Anemia   . Anxiety   . Asthma   . Cataract cortical, senile   . CHF (congestive heart failure) ([place])   . Chronic headaches   . Diastolic heart failure ([place])   . Diverticulosis   . Environmental allergies   . GERD (gastroesophageal reflux disease)   . Heart murmur   . Hyperglycemia   . Hyperlipidemia   . Hypertension   . Obesity   . Osteoarthritis   . Palpitations   . Restless leg syndrome   . Sleep apnea   . Thrombocytosis ([place])   . Urinary incontinence    mixed  . Venous insufficiency   . Vitamin D deficiency    Past Surgical History:  Procedure Laterality Date  . BLADDER SURGERY     x2   [facility]  . BREAST CYST ASPIRATION Bilateral 2005   approximate year  . CARDIAC CATHETERIZATION     [facility]hem[facility] Massed  . CERVICAL CONE BIOPSY     [facility]  . CHOLECYSTECTOMY    . COLONOSCOPY WITH PROPOFOL N/A 07/19/2016   Procedure: COLONOSCOPY WITH PROPOFOL;  Surgeon: Manya Silvas, MD;  Location: [facility] ENDOSCOPY;  Service: Endoscopy;  Laterality: N/A;  . COLONOSCOPY WITH PROPOFOL N/A 10/20/2018   Procedure: COLONOSCOPY WITH PROPOFOL;  Surgeon: Lollie Sails, MD;  Location: [facility] ENDOSCOPY;  Service: Endoscopy;  Laterality: N/A;  . ESOPHAGOGASTRODUODENOSCOPY (EGD) WITH PROPOFOL N/A  07/19/2016   Procedure: ESOPHAGOGASTRODUODENOSCOPY (EGD) WITH PROPOFOL;  Surgeon: Manya Silvas, MD;  Location: [facility] ENDOSCOPY;  Service: Endoscopy;  Laterality: N/A;  . FLEXIBLE SIGMOIDOSCOPY    . KNEE ARTHROSCOPY  08/13/08  . knee replacement and revision     left  . RECTOCELE REPAIR    . RIGHT/LEFT HEART CATH AND CORONARY ANGIOGRAPHY Bilateral 04/10/2018   Procedure: RIGHT/LEFT HEART CATH AND CORONARY ANGIOGRAPHY;  Surgeon: Wellington Hampshire, MD;  Location: [facility];  Service: Cardiovascular;  Laterality: Bilateral;  . ROTATOR CUFF REPAIR     bilateral  . SHOULDER SURGERY  11/17/05  . TONSILLECTOMY  1962  . VAGINAL HYSTERECTOMY  1974   abnormal pap and carcinoma in situ   Family History  Problem Relation Age of Onset  . Diabetes Mellitus II Father   . Thyroid disease Father   . Breast cancer Maternal Aunt   . Hypertension Mother   . Heart Problems Brother   . Leukemia Maternal Aunt   . Colon cancer Neg Hx   . Kidney cancer Neg Hx   . Bladder Cancer Neg Hx    Social History   Socioeconomic History  . Marital status: Married    Spouse name: Not on file  . Number of children: Not on file  .  Years of education: Not on file  . Highest education level: Not on file  Occupational History  . Occupation: retired  Scientific laboratory technician  . Financial resource strain: Not on file  . Food insecurity    Worry: Not on file    Inability: Not on file  . Transportation needs    Medical: Not on file    Non-medical: Not on file  Tobacco Use  . Smoking status: Former Smoker    Quit date: 08/17/1983    Years since quitting: 35.8  . Smokeless tobacco: Never Used  Substance and Sexual Activity  . Alcohol use: Yes    Alcohol/week: 0.0 standard drinks    Comment: rarely  . Drug use: No  . Sexual activity: Not on file  Lifestyle  . Physical activity    Days per week: Not on file    Minutes per session: Not on file  . Stress: Not on file  Relationships  . Social Product manager on phone: Not on file    Gets together: Not on file    Attends religious service: Not on file    Active member of club or organization: Not on file    Attends meetings of clubs or organizations: Not on file    Relationship status: Not on file  Other Topics Concern  . Not on file  Social History Narrative   Lives at home with husband    Outpatient Encounter Medications as of 06/21/2019  Medication Sig  . albuterol (PROVENTIL HFA;VENTOLIN HFA) 108 (90 BASE) MCG/ACT inhaler Inhale 2 puffs into the lungs every 4 (four) hours as needed. (Patient taking differently: Inhale 2 puffs into the lungs every 4 (four) hours as needed for wheezing or shortness of breath. )  . albuterol (PROVENTIL) (2.5 MG/3ML) 0.083% nebulizer solution Take 3 mLs (2.5 mg total) by nebulization every 6 (six) hours as needed for wheezing or shortness of breath.  Marland Kitchen aspirin EC 81 MG tablet Take 81 mg by mouth daily.  . budesonide-formoterol (SYMBICORT) 80-4.5 MCG/ACT inhaler Inhale 2 puffs into the lungs 2 (two) times daily.  . Calcium Citrate-Vitamin D (CALCIUM CITRATE + D PO) Take 1 tablet by mouth daily. 500/400  . Cholecalciferol (VITAMIN D-3) 1000 units CAPS Take 1 capsule by mouth daily.   . clobetasol ointment (TEMOVATE) 9.79 % Apply 1 application topically 2 (two) times daily.   Marland Kitchen gabapentin (NEURONTIN) 300 MG capsule Take 1 capsule (300 mg total) by mouth at bedtime as needed.  . linaclotide (LINZESS) 290 MCG CAPS capsule Take 290 mcg by mouth daily as needed.   . magnesium oxide (MAG-OX) 400 MG tablet Take 400 mg daily by mouth.  . montelukast (SINGULAIR) 10 MG tablet TAKE 1 TABLET BY MOUTH AT  BEDTIME  . Multiple Vitamin (MULTIVITAMIN) tablet Take 1 tablet by mouth daily.  . naproxen sodium (ALEVE) 220 MG tablet Take 220-440 mg by mouth daily as needed (pain).  . ondansetron (ZOFRAN) 4 MG tablet Take 1 tablet (4 mg total) by mouth 2 (two) times daily as needed for nausea or vomiting.  . RABEprazole (ACIPHEX)  20 MG tablet Take 20 mg by mouth 2 (two) times daily.  . rosuvastatin (CRESTOR) 10 MG tablet TAKE 1 TABLET BY MOUTH  DAILY  . sucralfate (CARAFATE) 1 g tablet Take 1 g by mouth daily.   Marland Kitchen venlafaxine XR (EFFEXOR-XR) 150 MG 24 hr capsule TAKE 1 CAPSULE BY MOUTH  DAILY WITH BREAKFAST  . vitamin B-12 (CYANOCOBALAMIN) 1000 MCG tablet  Take 1,000 mcg by mouth daily.  . furosemide (LASIX) 20 MG tablet Take 1 tablet (20 mg total) by mouth daily.  . metoprolol succinate (TOPROL-XL) 100 MG 24 hr tablet Take 1 tablet (100 mg total) by mouth daily. Take with or immediately following a meal.  . potassium chloride (K-DUR) 10 MEQ tablet Take 1 tablet (10 mEq total) by mouth daily.   No facility-administered encounter medications on file as of 06/21/2019.    Review of Systems  Constitutional: Negative for appetite change and unexpected weight change.  HENT: Negative for congestion and sinus pressure.   Respiratory: Negative for cough and chest tightness.        Breathing stable.    Cardiovascular: Negative for chest pain, palpitations and leg swelling.  Gastrointestinal: Negative for abdominal pain, diarrhea, nausea and vomiting.  Genitourinary: Negative for difficulty urinating and dysuria.  Musculoskeletal:       Joint aches and pains as outlined.    Skin: Negative for color change and rash.  Neurological: Negative for dizziness, light-headedness and headaches.  Psychiatric/Behavioral: Negative for agitation and dysphoric mood.       Objective:    Physical Exam Constitutional:      General: She is not in acute distress.    Appearance: Normal appearance.  HENT:     Head: Normocephalic and atraumatic.     Right Ear: External ear normal.     Left Ear: External ear normal.  Eyes:     General: No scleral icterus.       Right eye: No discharge.        Left eye: No discharge.     Conjunctiva/sclera: Conjunctivae normal.  Neck:     Musculoskeletal: Neck supple. No muscular tenderness.      Thyroid: No thyromegaly.  Cardiovascular:     Rate and Rhythm: Normal rate and regular rhythm.  Pulmonary:     Effort: No respiratory distress.     Breath sounds: Normal breath sounds. No wheezing.  Abdominal:     General: Bowel sounds are normal.     Palpations: Abdomen is soft.     Tenderness: There is no abdominal tenderness.  Musculoskeletal:        General: No swelling or tenderness.  Lymphadenopathy:     Cervical: No cervical adenopathy.  Skin:    Findings: No erythema or rash.  Neurological:     Mental Status: She is alert.  Psychiatric:        Mood and Affect: Mood normal.        Behavior: Behavior normal.     BP 126/74   Pulse 67   Temp (!) 96.4 F (35.8 C)   Resp 16   Wt 236 lb (107 kg)   SpO2 97%   BMI 39.27 kg/m  Wt Readings from Last 3 Encounters:  06/21/19 236 lb (107 kg)  06/18/19 237 lb (107.5 kg)  06/11/19 230 lb (104.3 kg)     Lab Results  Component Value Date   WBC 7.6 06/11/2019   HGB 13.3 06/11/2019   HCT 42.0 06/11/2019   PLT 482 (H) 06/11/2019   GLUCOSE 102 (H) 06/21/2019   CHOL 218 (H) 06/21/2019   TRIG 166.0 (H) 06/21/2019   HDL 53.90 06/21/2019   LDLDIRECT 172.8 09/17/2013   LDLCALC 131 (H) 06/21/2019   ALT 15 06/21/2019   AST 15 06/21/2019   NA 140 06/21/2019   K 4.8 06/21/2019   CL 104 06/21/2019   CREATININE 0.90 06/21/2019   BUN 13  06/21/2019   CO2 28 06/21/2019   TSH 2.05 06/21/2019   INR 1.0 06/11/2019   HGBA1C 6.0 06/21/2019   MICROALBUR 1.1 04/28/2015    Ct Bone Marrow Biopsy & Aspiration  Result Date: 06/11/2019 CLINICAL DATA:  Thrombocytosis and iron deficiency anemia. Bone marrow biopsy requested for further hematologic workup. EXAM: CT GUIDED BONE MARROW ASPIRATION AND BIOPSY ANESTHESIA/SEDATION: Versed 2.0 mg IV, Fentanyl 100 mcg IV Total Moderate Sedation Time:   15 minutes. The patient's level of consciousness and physiologic status were continuously monitored during the procedure by Radiology nursing.  PROCEDURE: The procedure risks, benefits, and alternatives were explained to the patient. Questions regarding the procedure were encouraged and answered. The patient understands and consents to the procedure. A time out was performed prior to initiating the procedure. The right gluteal region was prepped with chlorhexidine. Sterile gown and sterile gloves were used for the procedure. Local anesthesia was provided with 1% Lidocaine. Under CT guidance, an 11 gauge On Control bone cutting needle was advanced from a posterior approach into the right iliac bone. Needle positioning was confirmed with CT. Initial non heparinized and heparinized aspirate samples were obtained of bone marrow. Core biopsy was performed via the On Control drill needle. COMPLICATIONS: None FINDINGS: Inspection of initial aspirate did reveal visible particles. Intact core biopsy sample was obtained. IMPRESSION: CT guided bone marrow biopsy of right posterior iliac bone with both aspirate and core samples obtained. Electronically Signed   By: Aletta Edouard M.D.   On: 06/11/2019 09:58       Assessment & Plan:   Problem List Items Addressed This Visit    Anemia    Has been seeing hematology.        Benign essential HTN   Relevant Orders   TSH (Completed)   Basic metabolic panel (today) (Completed)   Combined hyperlipidemia   Relevant Orders   Hepatic function panel (Completed)   Lipid panel (Completed)   Diastolic heart failure (HCC)    Breathing stable.  No evidence of volume overload today.  Follow metabolic panel.       Gastroesophageal reflux disease without esophagitis    Controlled on current regimen.  Follow.       Hyperglycemia    Low carb diet and exercise.  Follow met b and a1c.       Relevant Orders   Hemoglobin A1c (Completed)   Iron deficiency anemia   Joint pain    Persistent and varying joint/muscle aches and pain.  Request rheumatology work up.  Was questioning previous elevated esr.  Recheck  rheum panel.        Relevant Orders   Anti-nuclear ab-titer (ANA titer) (Completed)   OSA (obstructive sleep apnea)    CPAP.       Rib pain on left side    Persistent.  Will xray.  Tylenol.  Follow.  No sob.        Relevant Orders   DG Ribs Unilateral Left (Completed)   Thrombocytosis ([place])    Worked up by hematology.  Determined to be reactive thrombocytosis.        Relevant Orders   Antinuclear Antib (ANA) (Completed)   Rheumatoid Factor (Completed)   Sed Rate (ESR) (Completed)   C-reactive protein (Completed)       Einar Pheasant, MD

## 2019-06-22 ENCOUNTER — Encounter: Payer: Self-pay | Admitting: Internal Medicine

## 2019-06-22 DIAGNOSIS — M5416 Radiculopathy, lumbar region: Secondary | ICD-10-CM | POA: Diagnosis not present

## 2019-06-22 DIAGNOSIS — M5136 Other intervertebral disc degeneration, lumbar region: Secondary | ICD-10-CM | POA: Diagnosis not present

## 2019-06-23 ENCOUNTER — Encounter: Payer: Self-pay | Admitting: Internal Medicine

## 2019-06-23 DIAGNOSIS — M2550 Pain in unspecified joint: Secondary | ICD-10-CM

## 2019-06-23 DIAGNOSIS — R768 Other specified abnormal immunological findings in serum: Secondary | ICD-10-CM

## 2019-06-23 LAB — RHEUMATOID FACTOR: Rheumatoid fact SerPl-aCnc: <14 IU/mL (ref <14)

## 2019-06-23 LAB — ANA: Anti Nuclear Antibody (ANA): POSITIVE — AB

## 2019-06-23 LAB — ANTI-NUCLEAR AB-TITER (ANA TITER): ANA Titer 1: 1:320 {titer} — ABNORMAL HIGH

## 2019-06-24 ENCOUNTER — Encounter: Payer: Self-pay | Admitting: Internal Medicine

## 2019-06-24 DIAGNOSIS — M2550 Pain in unspecified joint: Secondary | ICD-10-CM | POA: Insufficient documentation

## 2019-06-24 NOTE — Assessment & Plan Note (Signed)
Low carb diet and exercise.  Follow met b and a1c.  

## 2019-06-24 NOTE — Assessment & Plan Note (Signed)
Persistent and varying joint/muscle aches and pain.  Request rheumatology work up.  Was questioning previous elevated esr.  Recheck rheum panel.

## 2019-06-24 NOTE — Assessment & Plan Note (Signed)
Controlled on current regimen.  Follow.  

## 2019-06-24 NOTE — Assessment & Plan Note (Signed)
Worked up by hematology.  Determined to be reactive thrombocytosis.

## 2019-06-24 NOTE — Assessment & Plan Note (Signed)
Has been seeing hematology.

## 2019-06-24 NOTE — Telephone Encounter (Signed)
Order placed for rheumatology referral.  

## 2019-06-24 NOTE — Assessment & Plan Note (Signed)
CPAP.  

## 2019-06-24 NOTE — Assessment & Plan Note (Signed)
Breathing stable.  No evidence of volume overload today.  Follow metabolic panel.

## 2019-06-24 NOTE — Assessment & Plan Note (Signed)
Persistent.  Will xray.  Tylenol.  Follow.  No sob.

## 2019-06-25 ENCOUNTER — Observation Stay
Admission: EM | Admit: 2019-06-25 | Discharge: 2019-06-26 | Disposition: A | Discharge status: Home / Self Care | Payer: Medicare Other | Attending: Internal Medicine | Admitting: Internal Medicine

## 2019-06-25 ENCOUNTER — Observation Stay: Payer: Medicare Other

## 2019-06-25 ENCOUNTER — Other Ambulatory Visit: Payer: Self-pay

## 2019-06-25 ENCOUNTER — Encounter: Payer: Self-pay | Admitting: Medical Oncology

## 2019-06-25 ENCOUNTER — Emergency Department: Payer: Medicare Other

## 2019-06-25 ENCOUNTER — Observation Stay (HOSPITAL_BASED_OUTPATIENT_CLINIC_OR_DEPARTMENT_OTHER)
Admit: 2019-06-25 | Discharge: 2019-06-25 | Disposition: A | Discharge status: Home / Self Care | Payer: Medicare Other | Attending: Internal Medicine | Admitting: Internal Medicine

## 2019-06-25 DIAGNOSIS — R270 Ataxia, unspecified: Secondary | ICD-10-CM | POA: Diagnosis not present

## 2019-06-25 DIAGNOSIS — R260 Ataxic gait: Secondary | ICD-10-CM | POA: Diagnosis not present

## 2019-06-25 DIAGNOSIS — I10 Essential (primary) hypertension: Secondary | ICD-10-CM

## 2019-06-25 DIAGNOSIS — Z23 Encounter for immunization: Secondary | ICD-10-CM | POA: Insufficient documentation

## 2019-06-25 DIAGNOSIS — Z79899 Other long term (current) drug therapy: Secondary | ICD-10-CM | POA: Diagnosis not present

## 2019-06-25 DIAGNOSIS — M6281 Muscle weakness (generalized): Secondary | ICD-10-CM | POA: Diagnosis not present

## 2019-06-25 DIAGNOSIS — Z96652 Presence of left artificial knee joint: Secondary | ICD-10-CM | POA: Insufficient documentation

## 2019-06-25 DIAGNOSIS — G4733 Obstructive sleep apnea (adult) (pediatric): Secondary | ICD-10-CM | POA: Insufficient documentation

## 2019-06-25 DIAGNOSIS — R531 Weakness: Principal | ICD-10-CM | POA: Insufficient documentation

## 2019-06-25 DIAGNOSIS — Z20828 Contact with and (suspected) exposure to other viral communicable diseases: Secondary | ICD-10-CM | POA: Insufficient documentation

## 2019-06-25 DIAGNOSIS — R29898 Other symptoms and signs involving the musculoskeletal system: Secondary | ICD-10-CM | POA: Diagnosis not present

## 2019-06-25 DIAGNOSIS — Z6838 Body mass index (BMI) 38.0-38.9, adult: Secondary | ICD-10-CM | POA: Diagnosis not present

## 2019-06-25 DIAGNOSIS — I619 Nontraumatic intracerebral hemorrhage, unspecified: Secondary | ICD-10-CM | POA: Diagnosis not present

## 2019-06-25 DIAGNOSIS — Z7982 Long term (current) use of aspirin: Secondary | ICD-10-CM | POA: Diagnosis not present

## 2019-06-25 DIAGNOSIS — I110 Hypertensive heart disease with heart failure: Secondary | ICD-10-CM | POA: Diagnosis not present

## 2019-06-25 DIAGNOSIS — R262 Difficulty in walking, not elsewhere classified: Secondary | ICD-10-CM | POA: Diagnosis not present

## 2019-06-25 DIAGNOSIS — E669 Obesity, unspecified: Secondary | ICD-10-CM | POA: Insufficient documentation

## 2019-06-25 DIAGNOSIS — G2581 Restless legs syndrome: Secondary | ICD-10-CM | POA: Diagnosis not present

## 2019-06-25 DIAGNOSIS — Z743 Need for continuous supervision: Secondary | ICD-10-CM | POA: Diagnosis not present

## 2019-06-25 DIAGNOSIS — Z87891 Personal history of nicotine dependence: Secondary | ICD-10-CM | POA: Diagnosis not present

## 2019-06-25 DIAGNOSIS — D649 Anemia, unspecified: Secondary | ICD-10-CM | POA: Insufficient documentation

## 2019-06-25 DIAGNOSIS — E785 Hyperlipidemia, unspecified: Secondary | ICD-10-CM | POA: Insufficient documentation

## 2019-06-25 DIAGNOSIS — E559 Vitamin D deficiency, unspecified: Secondary | ICD-10-CM | POA: Diagnosis not present

## 2019-06-25 DIAGNOSIS — F419 Anxiety disorder, unspecified: Secondary | ICD-10-CM | POA: Insufficient documentation

## 2019-06-25 DIAGNOSIS — I6523 Occlusion and stenosis of bilateral carotid arteries: Secondary | ICD-10-CM | POA: Insufficient documentation

## 2019-06-25 DIAGNOSIS — J45909 Unspecified asthma, uncomplicated: Secondary | ICD-10-CM | POA: Diagnosis not present

## 2019-06-25 DIAGNOSIS — R29818 Other symptoms and signs involving the nervous system: Secondary | ICD-10-CM | POA: Diagnosis not present

## 2019-06-25 DIAGNOSIS — I5032 Chronic diastolic (congestive) heart failure: Secondary | ICD-10-CM | POA: Insufficient documentation

## 2019-06-25 DIAGNOSIS — K219 Gastro-esophageal reflux disease without esophagitis: Secondary | ICD-10-CM | POA: Diagnosis not present

## 2019-06-25 DIAGNOSIS — R0902 Hypoxemia: Secondary | ICD-10-CM | POA: Diagnosis not present

## 2019-06-25 DIAGNOSIS — G459 Transient cerebral ischemic attack, unspecified: Secondary | ICD-10-CM

## 2019-06-25 LAB — COMPREHENSIVE METABOLIC PANEL
ALT: 19 U/L (ref 0–44)
AST: 22 U/L (ref 15–41)
Albumin: 4.3 g/dL (ref 3.5–5.0)
Alkaline Phosphatase: 85 U/L (ref 38–126)
Anion gap: 11 (ref 5–15)
BUN: 18 mg/dL (ref 8–23)
CO2: 25 mmol/L (ref 22–32)
Calcium: 9.3 mg/dL (ref 8.9–10.3)
Chloride: 104 mmol/L (ref 98–111)
Creatinine, Ser: 1.03 mg/dL — ABNORMAL HIGH (ref 0.44–1.00)
GFR calc Af Amer: >60 mL/min (ref >60)
GFR calc non Af Amer: 55 mL/min — ABNORMAL LOW (ref >60)
Glucose, Bld: 114 mg/dL — ABNORMAL HIGH (ref 70–99)
Potassium: 3.7 mmol/L (ref 3.5–5.1)
Sodium: 140 mmol/L (ref 135–145)
Total Bilirubin: 0.8 mg/dL (ref 0.3–1.2)
Total Protein: 8.5 g/dL — ABNORMAL HIGH (ref 6.5–8.1)

## 2019-06-25 LAB — URINALYSIS, COMPLETE (UACMP) WITH MICROSCOPIC
Bacteria, UA: NONE SEEN
Bilirubin Urine: NEGATIVE
Glucose, UA: NEGATIVE mg/dL
Hgb urine dipstick: NEGATIVE
Ketones, ur: NEGATIVE mg/dL
Leukocytes,Ua: NEGATIVE
Nitrite: NEGATIVE
Protein, ur: NEGATIVE mg/dL
Specific Gravity, Urine: 1.021 (ref 1.005–1.030)
pH: 6 (ref 5.0–8.0)

## 2019-06-25 LAB — CBC WITH DIFFERENTIAL/PLATELET
Abs Immature Granulocytes: 0.03 10*3/uL (ref 0.00–0.07)
Basophils Absolute: 0.1 10*3/uL (ref 0.0–0.1)
Basophils Relative: 1 %
Eosinophils Absolute: 0.2 10*3/uL (ref 0.0–0.5)
Eosinophils Relative: 2 %
HCT: 43.7 % (ref 36.0–46.0)
Hemoglobin: 14.2 g/dL (ref 12.0–15.0)
Immature Granulocytes: 0 %
Lymphocytes Relative: 37 %
Lymphs Abs: 3.5 10*3/uL (ref 0.7–4.0)
MCH: 28.1 pg (ref 26.0–34.0)
MCHC: 32.5 g/dL (ref 30.0–36.0)
MCV: 86.4 fL (ref 80.0–100.0)
Monocytes Absolute: 0.8 10*3/uL (ref 0.1–1.0)
Monocytes Relative: 8 %
Neutro Abs: 4.9 10*3/uL (ref 1.7–7.7)
Neutrophils Relative %: 52 %
Platelets: 481 10*3/uL — ABNORMAL HIGH (ref 150–400)
RBC: 5.06 MIL/uL (ref 3.87–5.11)
RDW: 13.6 % (ref 11.5–15.5)
WBC: 9.5 10*3/uL (ref 4.0–10.5)
nRBC: 0 % (ref 0.0–0.2)

## 2019-06-25 LAB — SARS CORONAVIRUS 2 (TAT 6-24 HRS): SARS Coronavirus 2: NEGATIVE

## 2019-06-25 LAB — URINE DRUG SCREEN, QUALITATIVE (ARMC ONLY)
Amphetamines, Ur Screen: NOT DETECTED
Barbiturates, Ur Screen: NOT DETECTED
Benzodiazepine, Ur Scrn: NOT DETECTED
Cannabinoid 50 Ng, Ur ~~LOC~~: NOT DETECTED
Cocaine Metabolite,Ur ~~LOC~~: NOT DETECTED
MDMA (Ecstasy)Ur Screen: NOT DETECTED
Methadone Scn, Ur: NOT DETECTED
Opiate, Ur Screen: NOT DETECTED
Phencyclidine (PCP) Ur S: NOT DETECTED
Tricyclic, Ur Screen: NOT DETECTED

## 2019-06-25 LAB — TROPONIN I (HIGH SENSITIVITY): Troponin I (High Sensitivity): 4 ng/L (ref <18)

## 2019-06-25 LAB — ETHANOL: Alcohol, Ethyl (B): <10 mg/dL (ref <10)

## 2019-06-25 MED ORDER — ACETAMINOPHEN 325 MG PO TABS
650.0000 mg | ORAL_TABLET | ORAL | Status: DC | PRN
  Administered 2019-06-26: 650 mg via ORAL
  Filled 2019-06-25: qty 2

## 2019-06-25 MED ORDER — SUCRALFATE 1 G PO TABS: 1.0000 g | ORAL_TABLET | Freq: Every day | ORAL | Status: DC | PRN

## 2019-06-25 MED ORDER — SODIUM CHLORIDE 0.9 % IV SOLN
Freq: Once | INTRAVENOUS | Status: AC
  Administered 2019-06-25: 21:00:00 via INTRAVENOUS

## 2019-06-25 MED ORDER — MAGNESIUM OXIDE 400 (241.3 MG) MG PO TABS
400.0000 mg | ORAL_TABLET | Freq: Every day | ORAL | Status: DC
  Administered 2019-06-25 – 2019-06-26 (×2): 400 mg via ORAL
  Filled 2019-06-25 (×2): qty 1

## 2019-06-25 MED ORDER — ADULT MULTIVITAMIN W/MINERALS CH
1.0000 | ORAL_TABLET | Freq: Every day | ORAL | Status: DC
  Administered 2019-06-25 – 2019-06-26 (×2): 1 via ORAL
  Filled 2019-06-25 (×2): qty 1

## 2019-06-25 MED ORDER — ASPIRIN 81 MG PO CHEW
324.0000 mg | CHEWABLE_TABLET | Freq: Once | ORAL | Status: AC
  Administered 2019-06-25: 11:00:00 324 mg via ORAL
  Filled 2019-06-25: qty 4

## 2019-06-25 MED ORDER — VITAMIN B-12 1000 MCG PO TABS
1000.0000 ug | ORAL_TABLET | Freq: Every day | ORAL | Status: DC
  Administered 2019-06-25 – 2019-06-26 (×2): 1000 ug via ORAL
  Filled 2019-06-25 (×2): qty 1

## 2019-06-25 MED ORDER — MONTELUKAST SODIUM 10 MG PO TABS
10.0000 mg | ORAL_TABLET | Freq: Every day | ORAL | Status: DC
  Administered 2019-06-25: 10 mg via ORAL
  Filled 2019-06-25 (×2): qty 1

## 2019-06-25 MED ORDER — VITAMIN D3 25 MCG (1000 UNIT) PO TABS
1000.0000 [IU] | ORAL_TABLET | Freq: Every day | ORAL | Status: DC
  Administered 2019-06-25 – 2019-06-26 (×2): 1000 [IU] via ORAL
  Filled 2019-06-25 (×4): qty 1

## 2019-06-25 MED ORDER — FUROSEMIDE 20 MG PO TABS
20.0000 mg | ORAL_TABLET | Freq: Every day | ORAL | Status: DC
  Administered 2019-06-25 – 2019-06-26 (×2): 20 mg via ORAL
  Filled 2019-06-25 (×2): qty 1

## 2019-06-25 MED ORDER — GABAPENTIN 300 MG PO CAPS: 300.0000 mg | ORAL_CAPSULE | Freq: Every evening | ORAL | Status: DC | PRN

## 2019-06-25 MED ORDER — CHROMIUM 1 MG PO CAPS: 1.0000 mg | ORAL_CAPSULE | Freq: Every day | ORAL | Status: DC

## 2019-06-25 MED ORDER — ONDANSETRON HCL 4 MG PO TABS: 4.0000 mg | ORAL_TABLET | Freq: Two times a day (BID) | ORAL | Status: DC | PRN

## 2019-06-25 MED ORDER — ACETAMINOPHEN 160 MG/5ML PO SOLN
650.0000 mg | ORAL | Status: DC | PRN
  Filled 2019-06-25: qty 20.3

## 2019-06-25 MED ORDER — ENOXAPARIN SODIUM 40 MG/0.4ML ~~LOC~~ SOLN
40.0000 mg | SUBCUTANEOUS | Status: DC
  Administered 2019-06-25: 40 mg via SUBCUTANEOUS
  Filled 2019-06-25 (×2): qty 0.4

## 2019-06-25 MED ORDER — PNEUMOCOCCAL VAC POLYVALENT 25 MCG/0.5ML IJ INJ
0.5000 mL | INJECTION | INTRAMUSCULAR | Status: AC
  Administered 2019-06-26: 09:00:00 0.5 mL via INTRAMUSCULAR
  Filled 2019-06-25: qty 0.5

## 2019-06-25 MED ORDER — LINACLOTIDE 290 MCG PO CAPS
290.0000 ug | ORAL_CAPSULE | Freq: Every day | ORAL | Status: DC | PRN
  Administered 2019-06-25: 21:00:00 290 ug via ORAL
  Filled 2019-06-25 (×2): qty 1

## 2019-06-25 MED ORDER — METOPROLOL SUCCINATE ER 50 MG PO TB24
100.0000 mg | ORAL_TABLET | Freq: Every day | ORAL | Status: DC
  Administered 2019-06-25 – 2019-06-26 (×2): 100 mg via ORAL
  Filled 2019-06-25 (×2): qty 2

## 2019-06-25 MED ORDER — INFLUENZA VAC A&B SA ADJ QUAD 0.5 ML IM PRSY
0.5000 mL | PREFILLED_SYRINGE | INTRAMUSCULAR | Status: AC
  Administered 2019-06-26: 09:00:00 0.5 mL via INTRAMUSCULAR
  Filled 2019-06-25: qty 0.5

## 2019-06-25 MED ORDER — PANTOPRAZOLE SODIUM 40 MG PO TBEC
40.0000 mg | DELAYED_RELEASE_TABLET | Freq: Every day | ORAL | Status: DC
  Administered 2019-06-25 – 2019-06-26 (×2): 40 mg via ORAL
  Filled 2019-06-25 (×2): qty 1

## 2019-06-25 MED ORDER — STROKE: EARLY STAGES OF RECOVERY BOOK
Freq: Once | Status: AC
  Administered 2019-06-25: 21:00:00

## 2019-06-25 MED ORDER — ACETAMINOPHEN 650 MG RE SUPP: 650.0000 mg | RECTAL | Status: DC | PRN

## 2019-06-25 MED ORDER — ASPIRIN EC 325 MG PO TBEC
325.0000 mg | DELAYED_RELEASE_TABLET | Freq: Every day | ORAL | Status: DC
  Administered 2019-06-26: 325 mg via ORAL
  Filled 2019-06-25: qty 1

## 2019-06-25 MED ORDER — VENLAFAXINE HCL ER 150 MG PO CP24
150.0000 mg | ORAL_CAPSULE | Freq: Every day | ORAL | Status: DC
  Administered 2019-06-26: 10:00:00 150 mg via ORAL
  Filled 2019-06-25 (×2): qty 1

## 2019-06-25 MED ORDER — ROSUVASTATIN CALCIUM 5 MG PO TABS
5.0000 mg | ORAL_TABLET | Freq: Every day | ORAL | Status: DC
  Administered 2019-06-25: 21:00:00 5 mg via ORAL
  Filled 2019-06-25: qty 1

## 2019-06-25 MED ORDER — CALCIUM CARBONATE-VITAMIN D 500-200 MG-UNIT PO TABS
1.0000 | ORAL_TABLET | Freq: Every day | ORAL | Status: DC
  Administered 2019-06-25 – 2019-06-26 (×2): 1 via ORAL
  Filled 2019-06-25 (×2): qty 1

## 2019-06-25 MED ORDER — SENNOSIDES-DOCUSATE SODIUM 8.6-50 MG PO TABS: 1.0000 | ORAL_TABLET | Freq: Every evening | ORAL | Status: DC | PRN

## 2019-06-25 NOTE — Progress Notes (Signed)
*  PRELIMINARY RESULTS* Echocardiogram 2D Echocardiogram has been performed.  Sherrie Sport 06/25/2019, 3:47 PM

## 2019-06-25 NOTE — H&P (Signed)
[facility] [place] NAME: Nicole Sims    MR#:  [id number]  DATE OF BIRTH:  [birth year]  DATE OF ADMISSION:  06/25/2019  PRIMARY CARE PHYSICIAN: Einar Pheasant, MD   REQUESTING/REFERRING PHYSICIAN: Dr. Lenise Arena  CHIEF COMPLAINT:  bilateral lower extremity weakness. Patient developed unsteady gait in the morning.  Husband in the room. Patient is coming from home.  HISTORY OF PRESENT ILLNESS:  Nicole Sims  is a 68 y.o. female with a known history of \\anxiety , asthma, hypertension, hyperlipidemia, diastolic congestive heart failure chronic comes to the emergency room after she woke up feeling very weak both lower extremity. She tried to walk and felt unsteady on her gait. She mentioned to the ER physician that her right leg was more week. During my evaluation patient was hemodynamically stable. Husband is in the ER room. Patient complained of weakness in both lower extremities.  Patient had yard work done on Saturday for a few hours getting the yard ready for winter. She was fine all day Saturday. Sunday she woke up feeling tired and not her usual self. She rested most of the day. And Monday came in with bilateral lower extremity numbness/weakness and unsteady gait.  CT head was negative. Stroke workup was initiated.  PAST MEDICAL HISTORY:   Past Medical History:  Diagnosis Date  . Anemia   . Anxiety   . Asthma   . Cataract cortical, senile   . CHF (congestive heart failure) ([place])   . Chronic headaches   . Diastolic heart failure ([place])   . Diverticulosis   . Environmental allergies   . GERD (gastroesophageal reflux disease)   . Heart murmur   . Hyperglycemia   . Hyperlipidemia   . Hypertension   . Obesity   . Osteoarthritis   . Palpitations   . Restless leg syndrome   . Sleep apnea   . Thrombocytosis ([place])   . Urinary incontinence    mixed  . Venous insufficiency   . Vitamin D deficiency     PAST SURGICAL  HISTOIRY:   Past Surgical History:  Procedure Laterality Date  . BLADDER SURGERY     x2   [place] [facility] [place]  . BREAST CYST ASPIRATION Bilateral 2005   approximate year  . CARDIAC CATHETERIZATION     Nehemiah Massed  . CERVICAL CONE BIOPSY     [facility]  . CHOLECYSTECTOMY    . COLONOSCOPY WITH PROPOFOL N/A 07/19/2016   Procedure: COLONOSCOPY WITH PROPOFOL;  Surgeon: Manya Silvas, MD;  Location: [facility] ENDOSCOPY;  Service: Endoscopy;  Laterality: N/A;  . COLONOSCOPY WITH PROPOFOL N/A 10/20/2018   Procedure: COLONOSCOPY WITH PROPOFOL;  Surgeon: Lollie Sails, MD;  Location: [facility] ENDOSCOPY;  Service: Endoscopy;  Laterality: N/A;  . ESOPHAGOGASTRODUODENOSCOPY (EGD) WITH PROPOFOL N/A 07/19/2016   Procedure: ESOPHAGOGASTRODUODENOSCOPY (EGD) WITH PROPOFOL;  Surgeon: Manya Silvas, MD;  Location: [facility] ENDOSCOPY;  Service: Endoscopy;  Laterality: N/A;  . FLEXIBLE SIGMOIDOSCOPY    . KNEE ARTHROSCOPY  08/13/08  . knee replacement and revision     left  . RECTOCELE REPAIR    . RIGHT/LEFT HEART CATH AND CORONARY ANGIOGRAPHY Bilateral 04/10/2018   Procedure: RIGHT/LEFT HEART CATH AND CORONARY ANGIOGRAPHY;  Surgeon: Wellington Hampshire, MD;  Location: [place] CV LAB;  Service: Cardiovascular;  Laterality: Bilateral;  . ROTATOR CUFF REPAIR     bilateral  . SHOULDER SURGERY  11/17/05  . TONSILLECTOMY  1962  . VAGINAL HYSTERECTOMY  1974   abnormal  pap and carcinoma in situ    SOCIAL HISTORY:   Social History   Tobacco Use  . Smoking status: Former Smoker    Quit date: 08/17/1983    Years since quitting: 35.8  . Smokeless tobacco: Never Used  Substance Use Topics  . Alcohol use: Yes    Alcohol/week: 0.0 standard drinks    Comment: rarely    FAMILY HISTORY:   Family History  Problem Relation Age of Onset  . Diabetes Mellitus II Father   . Thyroid disease Father   . Breast cancer Maternal Aunt   . Hypertension Mother   . Heart Problems Brother   . Leukemia Maternal Aunt   . Colon  cancer Neg Hx   . Kidney cancer Neg Hx   . Bladder Cancer Neg Hx     DRUG ALLERGIES:   Allergies  Allergen Reactions  . Augmentin [Amoxicillin-Pot Clavulanate] Other (See Comments)    Questionable itching  . Bactrim [Sulfamethoxazole-Trimethoprim]   . Doxycycline Itching  . Hydrocodone-Acetaminophen Other (See Comments)    GI distress  . Levaquin [Levofloxacin]   . Pseudoephedrine     Itching of the scalp  . Shellfish Allergy     Nausea and vomiting     REVIEW OF SYSTEMS:  Review of Systems  Constitutional: Negative for chills, fever and weight loss.  HENT: Negative for ear discharge, ear pain and nosebleeds.   Eyes: Negative for blurred vision, pain and discharge.  Respiratory: Negative for sputum production, shortness of breath, wheezing and stridor.   Cardiovascular: Negative for chest pain, palpitations, orthopnea and PND.  Gastrointestinal: Negative for abdominal pain, diarrhea, nausea and vomiting.  Genitourinary: Negative for frequency and urgency.  Musculoskeletal: Negative for back pain and joint pain.  Neurological: Positive for focal weakness and weakness. Negative for sensory change and speech change.       Unsteady gait  Psychiatric/Behavioral: Negative for depression and hallucinations. The patient is not nervous/anxious.      MEDICATIONS AT HOME:   Prior to Admission medications   Medication Sig Start Date End Date Taking? Authorizing Provider  aspirin EC 81 MG tablet Take 81 mg by mouth daily.   Yes [provider]  Calcium Carb-Cholecalciferol (CALCIUM 500 +D) 500-400 MG-UNIT TABS Take 1 tablet by mouth daily.   Yes [provider]  Cholecalciferol (VITAMIN D-3) 1000 units CAPS Take 1,000 Units by mouth daily.    Yes [provider]  Chromium 1 MG CAPS Take 1 mg by mouth daily.   Yes [provider]  furosemide (LASIX) 20 MG tablet Take 1 tablet (20 mg total) by mouth daily. 03/21/19 06/25/19 Yes Dunn, Areta Haber, PA-C   gabapentin (NEURONTIN) 300 MG capsule Take 1 capsule (300 mg total) by mouth at bedtime as needed. Patient taking differently: Take 300-600 mg by mouth at bedtime as needed.  03/06/19  Yes Einar Pheasant, MD  linaclotide [facility]) 290 MCG CAPS capsule Take 290 mcg by mouth daily as needed (GI symptoms).    Yes [provider]  magnesium oxide (MAG-OX) 400 MG tablet Take 400 mg daily by mouth.   Yes [provider]  metoprolol succinate (TOPROL-XL) 100 MG 24 hr tablet Take 1 tablet (100 mg total) by mouth daily. Take with or immediately following a meal. 02/15/19 06/25/19 Yes Dunn, Ryan M, PA-C  montelukast (SINGULAIR) 10 MG tablet TAKE 1 TABLET BY MOUTH AT  BEDTIME Patient taking differently: Take 10 mg by mouth at bedtime.  05/10/19  Yes Einar Pheasant, MD  Multiple Vitamin (MULTIVITAMIN) tablet Take 1 tablet by mouth daily.   Yes [provider]  naproxen sodium (ALEVE) 220 MG tablet Take 220-440 mg by mouth daily as needed (pain).   Yes [provider]  ondansetron (ZOFRAN) 4 MG tablet Take 1 tablet (4 mg total) by mouth 2 (two) times daily as needed for nausea or vomiting. 03/15/19  Yes Einar Pheasant, MD  potassium chloride (K-DUR) 10 MEQ tablet Take 1 tablet (10 mEq total) by mouth daily. 03/21/19 06/25/19 Yes Dunn, Areta Haber, PA-C  RABEprazole (ACIPHEX) 20 MG tablet Take 20 mg by mouth 2 (two) times daily.   Yes [provider]  rosuvastatin (CRESTOR) 10 MG tablet TAKE 1 TABLET BY MOUTH  DAILY Patient taking differently: Take 5 mg by mouth See admin instructions. Take  tablet (5mg ) by mouth every Monday, Tuesday, Wednesday, Thursday and Friday night 01/09/19  Yes Einar Pheasant, MD  sucralfate (CARAFATE) 1 g tablet Take 1 g by mouth daily as needed (GI sympotms).  11/28/18  Yes [provider]  venlafaxine XR (EFFEXOR-XR) 150 MG 24 hr capsule TAKE 1 CAPSULE BY MOUTH  DAILY WITH BREAKFAST Patient taking differently: Take 150 mg by mouth daily with  breakfast.  05/10/19  Yes Einar Pheasant, MD  vitamin B-12 (CYANOCOBALAMIN) 1000 MCG tablet Take 1,000 mcg by mouth daily.   Yes [provider]      VITAL SIGNS:  Blood pressure (!) 160/83, pulse 73, temperature 97.9 F (36.6 C), temperature source Oral, resp. rate 18, height 5\' 5"  (1.651 m), weight 104.8 kg, SpO2 100 %.  PHYSICAL EXAMINATION:  GENERAL:  68 y.o.-year-old patient lying in the bed with no acute distress.  EYES: Pupils equal, round, reactive to light and accommodation. No scleral icterus. Extraocular muscles intact.  HEENT: Head atraumatic, normocephalic. Oropharynx and nasopharynx clear.  NECK:  Supple, no jugular venous distention. No thyroid enlargement, no tenderness.  LUNGS: Normal breath sounds bilaterally, no wheezing, rales,rhonchi or crepitation. No use of accessory muscles of respiration.  CARDIOVASCULAR: S1, S2 normal. No murmurs, rubs, or gallops.  ABDOMEN: Soft, nontender, nondistended. Bowel sounds present. No organomegaly or mass.  EXTREMITIES: No pedal edema, cyanosis, or clubbing.  NEUROLOGIC: Cranial nerves II through XII are intact. Muscle strength 5/5 in all extremities. Sensation intact. Gait not checked. Generalized weakness PSYCHIATRIC: The patient is alert and oriented x 3.  SKIN: No obvious rash, lesion, or ulcer.   LABORATORY PANEL:   CBC Recent Labs  Lab 06/25/19 0947  WBC 9.5  HGB 14.2  HCT 43.7  PLT 481*   ------------------------------------------------------------------------------------------------------------------  Chemistries  Recent Labs  Lab 06/25/19 0947  NA 140  K 3.7  CL 104  CO2 25  GLUCOSE 114*  BUN 18  CREATININE 1.03*  CALCIUM 9.3  AST 22  ALT 19  ALKPHOS 85  BILITOT 0.8   ------------------------------------------------------------------------------------------------------------------  Cardiac Enzymes No results for input(s): TROPONINI in the last 168  hours. ------------------------------------------------------------------------------------------------------------------  RADIOLOGY:  Ct Head Wo Contrast  Result Date: 06/25/2019 CLINICAL DATA:  Focal neuro deficit. EXAM: CT HEAD WITHOUT CONTRAST TECHNIQUE: Contiguous axial images were obtained from the base of the skull through the vertex without intravenous contrast. COMPARISON:  MRI head 05/26/2015 FINDINGS: Brain: No evidence of acute infarction, hemorrhage, hydrocephalus, extra-axial collection or mass lesion/mass effect. Vascular: Negative for hyperdense vessel Skull: Negative Sinuses/Orbits: Negative Other: None IMPRESSION: Negative CT head Electronically Signed   By: Franchot Gallo M.D.   On: 06/25/2019 10:21   Mr Angio Head Wo Contrast  Result Date: 06/25/2019 CLINICAL DATA:  Ataxia, stroke suspected. Cerebral aneurysm, subarachnoid hemorrhage, cerebral vasospasm evaluation. Additional history provided: Patient presents to ED for weakness beginning yesterday with difficulty walking. EXAM: MRI HEAD WITHOUT CONTRAST MRA HEAD WITHOUT CONTRAST TECHNIQUE: Multiplanar, multiecho pulse sequences of the brain and surrounding structures were obtained without intravenous contrast. Angiographic images of the head were obtained using MRA technique without contrast. COMPARISON:  Head CT 06/25/2019, brain MRI 05/26/2015 FINDINGS: MRI HEAD FINDINGS Brain: There is no convincing evidence of acute infarct. No evidence of intracranial mass. No midline shift or extra-axial fluid collection. No chronic intracranial blood products. Mild scattered T2/FLAIR hyperintensity within the cerebral white matter is nonspecific, but consistent with chronic small vessel ischemic disease. Cerebral volume is normal for age. Vascular: Reported separately Skull and upper cervical spine: No focal marrow lesion Sinuses/Orbits: Visualized orbits demonstrate no acute abnormality. Trace ethmoid sinus mucosal thickening. No significant  mastoid effusion. MRA HEAD FINDINGS The intracranial internal carotid arteries are patent with mild atherosclerotic irregularity. The bilateral middle and anterior cerebral arteries are patent without significant proximal stenosis. No intracranial aneurysm is identified. The intracranial vertebral arteries are patent. Apparent moderate focal stenosis within the intracranial right vertebral artery. The basilar artery is patent without significant stenosis. The right posterior cerebral artery is patent without significant proximal stenosis. A right posterior communicating artery is present. Predominantly fetal origin of the left posterior cerebral artery, which is patent without significant proximal stenosis. IMPRESSION: MRI brain: 1. No evidence of acute intracranial abnormality. 2. Mild chronic small vessel ischemic disease. MRA head: 1. No intracranial large vessel occlusion. 2. Moderate focal stenosis within the intracranial right vertebral artery. 3. Mild atherosclerotic irregularity of the intracranial internal carotid arteries. 4. No intracranial aneurysm identified. Electronically Signed   By: Kellie Simmering DO   On: 06/25/2019 13:11   Mr Brain Wo Contrast  Result Date: 06/25/2019 CLINICAL DATA:  Ataxia, stroke suspected. Cerebral aneurysm, subarachnoid hemorrhage, cerebral vasospasm evaluation. Additional history provided: Patient presents to ED for weakness beginning yesterday with difficulty walking. EXAM: MRI HEAD WITHOUT CONTRAST MRA HEAD WITHOUT CONTRAST TECHNIQUE: Multiplanar, multiecho pulse sequences of the brain and surrounding structures were obtained without intravenous contrast. Angiographic images of the head were obtained using MRA technique without contrast. COMPARISON:  Head CT 06/25/2019, brain MRI 05/26/2015 FINDINGS: MRI HEAD FINDINGS Brain: There is no convincing evidence of acute infarct. No evidence of intracranial mass. No midline shift or extra-axial fluid collection. No chronic  intracranial blood products. Mild scattered T2/FLAIR hyperintensity within the cerebral white matter is nonspecific, but consistent with chronic small vessel ischemic disease. Cerebral volume is normal for age. Vascular: Reported separately Skull and upper cervical spine: No focal marrow lesion Sinuses/Orbits: Visualized orbits demonstrate no acute abnormality. Trace ethmoid sinus mucosal thickening. No significant mastoid effusion. MRA HEAD FINDINGS The intracranial internal carotid arteries are patent with mild atherosclerotic irregularity. The bilateral middle and anterior cerebral arteries are patent without significant proximal stenosis. No intracranial aneurysm is identified. The intracranial vertebral arteries are patent. Apparent moderate focal stenosis within the intracranial right vertebral artery. The basilar artery is patent without significant stenosis. The right posterior cerebral artery is patent without significant proximal stenosis. A right posterior communicating artery is present. Predominantly fetal origin of the left posterior cerebral artery, which is patent without significant proximal stenosis. IMPRESSION: MRI brain: 1. No evidence of acute intracranial abnormality. 2. Mild chronic small vessel ischemic disease. MRA head: 1. No intracranial large vessel occlusion. 2. Moderate focal stenosis within the  intracranial right vertebral artery. 3. Mild atherosclerotic irregularity of the intracranial internal carotid arteries. 4. No intracranial aneurysm identified. Electronically Signed   By: Kellie Simmering DO   On: 06/25/2019 13:11    EKG:    IMPRESSION AND PLAN:   Nicole Sims  is a 68 y.o. female with a known history of \\anxiety , asthma, hypertension, hyperlipidemia, diastolic congestive heart failure chronic comes to the emergency room after she woke up feeling very weak both lower extremity. She tried to walk and felt unsteady on her gait. She mentioned to the ER physician that her right  leg was more week.  1. Bilateral lower extremity weakness with unsteady gait more so leaning on the right when tried in the ER earlier -CT head negative -admit to observation -MRI of the brain, MRI of the brain, echo, carotid ultrasound -neurology consultation with Dr. Irish Elders. Recommends aspirin 325 mg daily. - Continue statin -physical therapy, occupational therapy  2. Hypertension continue metoprolol  3. Hyperlipidemia on statins  4. Jerrye Bushy continue PPI and sucralfate  5. Chronic diastolic congestive heart failure. Patient appears euvolemic. She is on room air. -Continue her Lasix and metoprolol  6. DVT prophylaxis Lovenox  D/w patient and husband in the ER  All the records are reviewed and case discussed with ED provider.   CODE STATUS: full  TOTAL TIME TAKING CARE OF THIS PATIENT: *50* minutes.    Fritzi Mandes M.D on 06/25/2019 at 2:31 PM  Between 7am to 6pm - Pager - [phone]  After 6pm go to [email or web address] - password [email or web address] [facility]    CC: Primary care physician; Einar Pheasant, MD

## 2019-06-25 NOTE — ED Provider Notes (Signed)
[facility] Emergency Department Provider Note       Time seen: ----------------------------------------- 9:43 AM on 06/25/2019 -----------------------------------------   I have reviewed the triage vital signs and the nursing notes.  HISTORY   Chief Complaint Extremity Weakness    HPI Nicole Sims is a 68 y.o. female with a history of anemia, anxiety, asthma, CHF, thrombocytosis who presents to the ED for weakness beginning yesterday with difficulty using her legs and walking.  She is very unsteady on her feet according to her and her husband.  She states she cannot stop shaking.  She denies any other neurologic symptoms other than lack of coordination and difficulty walking  Past Medical History:  Diagnosis Date  . Anemia   . Anxiety   . Asthma   . Cataract cortical, senile   . CHF (congestive heart failure) ([place])   . Chronic headaches   . Diastolic heart failure ([place])   . Diverticulosis   . Environmental allergies   . GERD (gastroesophageal reflux disease)   . Heart murmur   . Hyperglycemia   . Hyperlipidemia   . Hypertension   . Obesity   . Osteoarthritis   . Palpitations   . Restless leg syndrome   . Sleep apnea   . Thrombocytosis ([place])   . Urinary incontinence    mixed  . Venous insufficiency   . Vitamin D deficiency     Patient Active Problem List   Diagnosis Date Noted  . Joint pain 06/24/2019  . Rib pain on left side 06/21/2019  . Arthropathy, lower leg 05/24/2019  . Iron deficiency anemia 10/12/2018  . Diastolic heart failure ([place]) 04/10/2018  . SOB (shortness of breath)   . Vitamin D deficiency 04/02/2017  . Leg pain, bilateral 01/10/2017  . Dry eyes 10/24/2016  . Blurred vision 06/07/2016  . Nasal congestion 10/21/2015  . Abdominal pain in female 10/20/2015  . Cough 08/25/2015  . OSA (obstructive sleep apnea) 01/21/2015  . Benign essential HTN 01/21/2015  . Lichen sclerosus [id number]  . Chest pain 12/17/2014  .  Health care maintenance 12/17/2014  . Stress 02/24/2014  . Sinusitis 02/24/2014  . Spondylosis of cervical region without myelopathy or radiculopathy 01/28/2014  . Myofascial pain 01/25/2014  . Radiculitis, cervical 01/25/2014  . Tachycardia 09/30/2013  . Palpitations 08/21/2012  . Osteoarthritis 08/21/2012  . Combined hyperlipidemia 08/21/2012  . Gastroesophageal reflux disease without esophagitis 08/21/2012  . Thrombocytosis ([place]) 08/21/2012  . Anemia 08/21/2012  . Venous insufficiency of both lower extremities 08/21/2012  . Chronic headaches 08/21/2012  . Hyperglycemia 08/21/2012    Past Surgical History:  Procedure Laterality Date  . BLADDER SURGERY     x2   [place] [facility] [place]  . BREAST CYST ASPIRATION Bilateral 2005   approximate year  . CARDIAC CATHETERIZATION     Nehemiah Massed  . CERVICAL CONE BIOPSY     [facility]  . CHOLECYSTECTOMY    . COLONOSCOPY WITH PROPOFOL N/A 07/19/2016   Procedure: COLONOSCOPY WITH PROPOFOL;  Surgeon: Manya Silvas, MD;  Location: [facility] ENDOSCOPY;  Service: Endoscopy;  Laterality: N/A;  . COLONOSCOPY WITH PROPOFOL N/A 10/20/2018   Procedure: COLONOSCOPY WITH PROPOFOL;  Surgeon: Lollie Sails, MD;  Location: [facility] ENDOSCOPY;  Service: Endoscopy;  Laterality: N/A;  . ESOPHAGOGASTRODUODENOSCOPY (EGD) WITH PROPOFOL N/A 07/19/2016   Procedure: ESOPHAGOGASTRODUODENOSCOPY (EGD) WITH PROPOFOL;  Surgeon: Manya Silvas, MD;  Location: [facility] ENDOSCOPY;  Service: Endoscopy;  Laterality: N/A;  . FLEXIBLE SIGMOIDOSCOPY    . KNEE ARTHROSCOPY  08/13/08  . knee replacement and revision     left  . RECTOCELE REPAIR    . RIGHT/LEFT HEART CATH AND CORONARY ANGIOGRAPHY Bilateral 04/10/2018   Procedure: RIGHT/LEFT HEART CATH AND CORONARY ANGIOGRAPHY;  Surgeon: Wellington Hampshire, MD;  Location: [facility] CV LAB;  Service: Cardiovascular;  Laterality: Bilateral;  . ROTATOR CUFF REPAIR     bilateral  . SHOULDER SURGERY  11/17/05  . TONSILLECTOMY  1962  . VAGINAL  HYSTERECTOMY  1974   abnormal pap and carcinoma in situ    Allergies Augmentin [amoxicillin-pot clavulanate], Bactrim [sulfamethoxazole-trimethoprim], Doxycycline, Hydrocodone-acetaminophen, Levaquin [levofloxacin], Pseudoephedrine, and Shellfish allergy  Social History Social History   Tobacco Use  . Smoking status: Former Smoker    Quit date: 08/17/1983    Years since quitting: 35.8  . Smokeless tobacco: Never Used  Substance Use Topics  . Alcohol use: Yes    Alcohol/week: 0.0 standard drinks    Comment: rarely  . Drug use: No   Review of Systems Constitutional: Negative for fever. Cardiovascular: Negative for chest pain. Respiratory: Negative for shortness of breath. Gastrointestinal: Negative for abdominal pain, vomiting and diarrhea. Musculoskeletal: Negative for back pain. Skin: Negative for rash. Neurological: Positive for weakness, ataxia, lack of coordination  All systems negative/normal/unremarkable except as stated in the HPI  ____________________________________________   PHYSICAL EXAM:  VITAL SIGNS: ED Triage Vitals  Enc Vitals Group     BP      Pulse      Resp      Temp      Temp src      SpO2      Weight      Height      Head Circumference      Peak Flow      Pain Score      Pain Loc      Pain Edu?      Excl. in [place]?    Constitutional: Alert and oriented.  Anxious, no distress Eyes: Conjunctivae are normal. Normal extraocular movements. ENT      Head: Normocephalic and atraumatic.      Nose: No congestion/rhinnorhea.      Mouth/Throat: Mucous membranes are moist.      Neck: No stridor. Cardiovascular: Normal rate, regular rhythm. No murmurs, rubs, or gallops. Respiratory: Normal respiratory effort without tachypnea nor retractions. Breath sounds are clear and equal bilaterally. No wheezes/rales/rhonchi. Gastrointestinal: Soft and nontender. Normal bowel sounds Musculoskeletal: Nontender with normal range of motion in extremities. No lower  extremity tenderness nor edema. Neurologic:  Normal speech and language.  Tremors noted, patient has imprecise finger-to-nose testing bilaterally.  Patient is very unsteady on her feet positive Romberg, falls to the right.  No other obvious motor deficits.  Cranial nerves appear to be intact Skin:  Skin is warm, dry and intact. No rash noted. Psychiatric: Mood and affect are normal. Speech and behavior are normal.  ____________________________________________  EKG: Interpreted by me.  Sinus rhythm with a rate of 79 bpm, normal PR interval, normal QRS, normal QT  ____________________________________________  ED COURSE:  As part of my medical decision making, I reviewed the following data within the [place] History obtained from family if available, nursing notes, old chart and ekg, as well as notes from prior ED visits. Patient presented for ataxia, we will assess with labs and imaging as indicated at this time.   Procedures  Nicole Sims was evaluated in Emergency Department on 06/25/2019 for the symptoms described in  the history of present illness. She was evaluated in the context of the global COVID-19 pandemic, which necessitated consideration that the patient might be at risk for infection with the SARS-CoV-2 virus that causes COVID-19. Institutional protocols and algorithms that pertain to the evaluation of patients at risk for COVID-19 are in a state of rapid change based on information released by regulatory bodies including the CDC and federal and state organizations. These policies and algorithms were followed during the patient's care in the ED.  ____________________________________________   LABS (pertinent positives/negatives)  Labs Reviewed  CBC WITH DIFFERENTIAL/PLATELET - Abnormal; Notable for the following components:      Result Value   Platelets 481 (*)    All other components within normal limits  COMPREHENSIVE METABOLIC PANEL - Abnormal; Notable for  the following components:   Glucose, Bld 114 (*)    Creatinine, Ser 1.03 (*)    Total Protein 8.5 (*)    GFR calc non Af Amer 55 (*)    All other components within normal limits  SARS CORONAVIRUS 2 (TAT 6-24 HRS)  URINALYSIS, COMPLETE (UACMP) WITH MICROSCOPIC  URINE DRUG SCREEN, QUALITATIVE ([facility] ONLY)  ETHANOL  CBG MONITORING, ED  TROPONIN I (HIGH SENSITIVITY)    RADIOLOGY Images were viewed by me  CT head Is unremarkable ____________________________________________   DIFFERENTIAL DIAGNOSIS   CVA, TIA, thrombocytosis, withdrawal, intoxication  FINAL ASSESSMENT AND PLAN  Weakness, ataxia   Plan: The patient had presented for ataxia. Patient's labs did not reveal any acute process. Patient's imaging initially reassuring.  Clinically with her ataxia she has likely had a CVA.  She was given full dose aspirin.  I have ordered an MRI/MRA of her brain.  I will consult the hospitalist for admission and full stroke work-up.   Laurence Aly, MD    Note: This note was generated in part or whole with voice recognition software. Voice recognition is usually quite accurate but there are transcription errors that can and very often do occur. I apologize for any typographical errors that were not detected and corrected.     Earleen Newport, MD 06/25/19 1049

## 2019-06-25 NOTE — Care Management Obs Status (Signed)
[place] NOTIFICATION   Patient Details  Name: EVALYNNE COMTE MRN: [id number] Date of Birth: 12/27/1950   Medicare Observation Status Notification Given:  Yes    Tania Tanor Glaspy, LCSW 06/25/2019, 5:03 PM

## 2019-06-25 NOTE — Evaluation (Signed)
Physical Therapy Evaluation Patient Details Name: WILBUR LABUDA MRN: [id number] DOB: [birth year] Today's Date: 06/25/2019   History of Present Illness  Nicole Sims is a 60yoF who [facility] on 11/9 after acut eonset bilat leg weakness and gait ataxia. Pt has has workup from neurology with negative MRI of brain (for acute infarct). Pt recently had bone marrow biopsy, as well as bilat lumbar spine injections 3DA for DJD of lumbar spine. PTA pt was an independent community ambulator without assistive device. Pt sustained 1 fall in the past 6 months.  Clinical Impression  Pt admitted with above diagnosis. Pt currently with functional limitations due to the deficits listed below (see "PT Problem List"). Upon entry, pt in bed, awake and agreeable to participate. Husband at bedside. The pt is alert and oriented x4, pleasant, conversational, and generally a good historian. Bed mobility and transfers at supervision level, RW facilitating independence and balance. Pt unstable to stand and balance without UE support, 2 LOB over 2 trials. AMB demonstrates worsening of leg buckling over 5f to hallway, but after recovery pt AMB 231fback to room. Loss of stability is concerningly without explanation and without warning, hence distance titration would not guarantee safe access of home at this time. Functional mobility assessment demonstrates increased effort/time requirements, poor tolerance, and need for physical assistance, whereas the patient performed these at a much higher level of independence PTA. Pt will benefit from skilled PT intervention to increase independence and safety with basic mobility in preparation for discharge to the venue listed below.       Follow Up Recommendations SNF;Supervision for mobility/OOB;Supervision - Intermittent    Equipment Recommendations  Wheelchair (measurements PT);Wheelchair cushion (measurements PT);Rolling walker with 5" wheels;Other (comment)(ramp for home entry)    Recommendations for Other Services       Precautions / Restrictions Precautions Precautions: Fall Restrictions Weight Bearing Restrictions: No      Mobility  Bed Mobility Overal bed mobility: Modified Independent Bed Mobility: Supine to Sit;Sit to Supine     Supine to sit: Modified independent (Device/Increase time) Sit to supine: Modified independent (Device/Increase time)      Transfers Overall transfer level: Needs assistance Equipment used: Rolling walker (2 wheeled) Transfers: Sit to/from Stand Sit to Stand: Min guard;Supervision         General transfer comment: chronic difficulty with rising, with well established and effective trunk thrust strategy to rise from chair.  Ambulation/Gait Ambulation/Gait assistance: Min guard;Min assist Gait Distance (Feet): 22 Feet Assistive device: Rolling walker (2 wheeled) Gait Pattern/deviations: Ataxic     General Gait Details: able to walk to room door slowly and steady with some mild perceived instability upon standing, then right after ante door pt has gross buckling of BLE; authro grabs a nearby chair to allow pt to sit.  Stairs            Wheelchair Mobility    Modified Rankin (Stroke Patients Only)       Balance Overall balance assessment: Needs assistance   Sitting balance-Leahy Scale: Good     Standing balance support: No upper extremity supported Standing balance-Leahy Scale: Zero Standing balance comment: 2 attempts to stand hands free and balance with retropulsion toward bed each time; pt has delayed awareness of postural sway                             Pertinent Vitals/Pain Pain Assessment: No/denies pain    Home Living Family/patient  expects to be discharged to:: Private residence Living Arrangements: Alone Available Help at Discharge: Family Type of Home: Mobile home Home Access: Stairs to enter Entrance Stairs-Rails: Right;Left;Can reach both Entrance Stairs-Number of  Steps: 4 Home Layout: One level Home Equipment: Bedside commode;Walker - 2 wheels      Prior Function Level of Independence: Independent               Hand Dominance   Dominant Hand: Left    Extremity/Trunk Assessment   Upper Extremity Assessment Upper Extremity Assessment: Generalized weakness    Lower Extremity Assessment Lower Extremity Assessment: Generalized weakness       Communication      Cognition Arousal/Alertness: Awake/alert Behavior During Therapy: WFL for tasks assessed/performed Overall Cognitive Status: Within Functional Limits for tasks assessed                                        General Comments      Exercises     Assessment/Plan    PT Assessment Patient needs continued PT services  PT Problem List Decreased strength;Decreased range of motion;Decreased activity tolerance;Decreased balance;Decreased mobility;Decreased knowledge of precautions;Decreased coordination       PT Treatment Interventions DME instruction;Therapeutic exercise;Gait training;Balance training;Stair training;Neuromuscular re-education;Functional mobility training;Cognitive remediation;Therapeutic activities;Patient/family education    PT Goals (Current goals can be found in the Care Plan section)  Acute Rehab PT Goals Patient Stated Goal: return to baseline mobiity. PT Goal Formulation: With patient Time For Goal Achievement: 07/09/19 Potential to Achieve Goals: Fair    Frequency Min 2X/week   Barriers to discharge Inaccessible home environment;Decreased caregiver support stairs to enter; husband works 4x10hrs weekly    Co-evaluation               AM-PAC PT "6 Clicks" Mobility  Outcome Measure Help needed turning from your back to your side while in a flat bed without using bedrails?: None Help needed moving from lying on your back to sitting on the side of a flat bed without using bedrails?: None Help needed moving to and from a bed  to a chair (including a wheelchair)?: A Little Help needed standing up from a chair using your arms (e.g., wheelchair or bedside chair)?: A Little Help needed to walk in hospital room?: A Little Help needed climbing 3-5 steps with a railing? : A Lot 6 Click Score: 19    End of Session Equipment Utilized During Treatment: Gait belt Activity Tolerance: No increased pain;Patient limited by fatigue Patient left: in bed;with family/visitor present;with call bell/phone within reach Nurse Communication: Mobility status PT Visit Diagnosis: Unsteadiness on feet (R26.81);Difficulty in walking, not elsewhere classified (R26.2);Other abnormalities of gait and mobility (R26.89);Ataxic gait (R26.0)    Time: 9311-2162 PT Time Calculation (min) (ACUTE ONLY): 25 min   Charges:   PT Evaluation $PT Eval High Complexity: 1 High PT Treatments $Therapeutic Exercise: 8-22 mins        5:01 PM, 06/25/19 Etta Grandchild, PT, DPT Physical Therapist - [facility]  [phone] ([place])   , C 06/25/2019, 4:58 PM

## 2019-06-25 NOTE — ED Notes (Signed)
Patient transported to CT 

## 2019-06-25 NOTE — Consult Note (Addendum)
Reason for Consult: ataxia/falling Referring Physician: Dr. Jimmye Norman   CC: ataxia/falling  HPI: Nicole Sims is an 68 y.o. female history of anemia, anxiety, asthma, CHF, thrombocytosis, HTN, HLD who presents to the ED for weakness beginning yesterday with difficulty using her legs and walking.  Patient states she was not feeling well yesterday and today felt her balance is off, falling to the right side.   Past Medical History:  Diagnosis Date  . Anemia   . Anxiety   . Asthma   . Cataract cortical, senile   . CHF (congestive heart failure) ([place])   . Chronic headaches   . Diastolic heart failure ([place])   . Diverticulosis   . Environmental allergies   . GERD (gastroesophageal reflux disease)   . Heart murmur   . Hyperglycemia   . Hyperlipidemia   . Hypertension   . Obesity   . Osteoarthritis   . Palpitations   . Restless leg syndrome   . Sleep apnea   . Thrombocytosis ([place])   . Urinary incontinence    mixed  . Venous insufficiency   . Vitamin D deficiency     Past Surgical History:  Procedure Laterality Date  . BLADDER SURGERY     x2   [facility]  . BREAST CYST ASPIRATION Bilateral 2005   approximate year  . CARDIAC CATHETERIZATION     Nehemiah Massed  . CERVICAL CONE BIOPSY     CIS  . CHOLECYSTECTOMY    . COLONOSCOPY WITH PROPOFOL N/A 07/19/2016   Procedure: COLONOSCOPY WITH PROPOFOL;  Surgeon: Manya Silvas, MD;  Location: [facility] ENDOSCOPY;  Service: Endoscopy;  Laterality: N/A;  . COLONOSCOPY WITH PROPOFOL N/A 10/20/2018   Procedure: COLONOSCOPY WITH PROPOFOL;  Surgeon: Lollie Sails, MD;  Location: [facility] ENDOSCOPY;  Service: Endoscopy;  Laterality: N/A;  . ESOPHAGOGASTRODUODENOSCOPY (EGD) WITH PROPOFOL N/A 07/19/2016   Procedure: ESOPHAGOGASTRODUODENOSCOPY (EGD) WITH PROPOFOL;  Surgeon: Manya Silvas, MD;  Location[facility]COPY;  Service: Endoscopy;  Laterality: N/A;  . FLEXIBLE SIGMOIDOSCOPY    . KNEE ARTHROSCOPY  08/13/08  . knee replacement  and revision     left  . RECTOCELE REPAIR    . RIGHT/LEFT HEART CATH AND CORONARY ANGIOGRAPHY Bilateral 04/10/2018   Procedure: RIGHT/LEFT HEART CATH AND CORONARY ANGIOGRAPHY;  Surgeon: Wellington Hampshire, MD;  Location: [place] CV LAB;  Service: Cardiovascular;  Laterality: Bilateral;  . ROTATOR CUFF REPAIR     bilateral  . SHOULDER SURGERY  11/17/05  . TONSILLECTOMY  1962  . VAGINAL HYSTERECTOMY  1974   abnormal pap and carcinoma in situ    Family History  Problem Relation Age of Onset  . Diabetes Mellitus II Father   . Thyroid disease Father   . Breast cancer Maternal Aunt   . Hypertension Mother   . Heart Problems Brother   . Leukemia Maternal Aunt   . Colon cancer Neg Hx   . Kidney cancer Neg Hx   . Bladder Cancer Neg Hx     Social History:  reports that she quit smoking about 35 years ago. She has never used smokeless tobacco. She reports current alcohol use. She reports that she does not use drugs.  Allergies  Allergen Reactions  . Augmentin [Amoxicillin-Pot Clavulanate] Other (See Comments)    Questionable itching  . Bactrim [Sulfamethoxazole-Trimethoprim]   . Doxycycline Itching  . Hydrocodone-Acetaminophen Other (See Comments)    GI distress  . Levaquin [Levofloxacin]   . Pseudoephedrine     Itching of the  scalp  . Shellfish Allergy     Nausea and vomiting     Medications: I have reviewed the patient's current medications.  ROS: History obtained from the patient  General ROS: negative for - chills, fatigue, fever, night sweats, weight gain or weight loss Psychological ROS: negative for - behavioral disorder, hallucinations, memory difficulties, mood swings or suicidal ideation Ophthalmic ROS: negative for - blurry vision, double vision, eye pain or loss of vision ENT ROS: negative for - epistaxis, nasal discharge, oral lesions, sore throat, tinnitus or vertigo Allergy and Immunology ROS: negative for - hives or itchy/watery eyes Hematological and  Lymphatic ROS: negative for - bleeding problems, bruising or swollen lymph nodes Endocrine ROS: negative for - galactorrhea, hair pattern changes, polydipsia/polyuria or temperature intolerance Respiratory ROS: negative for - cough, hemoptysis, shortness of breath or wheezing Cardiovascular ROS: negative for - chest pain, dyspnea on exertion, edema or irregular heartbeat Gastrointestinal ROS: negative for - abdominal pain, diarrhea, hematemesis, nausea/vomiting or stool incontinence Genito-Urinary ROS: negative for - dysuria, hematuria, incontinence or urinary frequency/urgency Musculoskeletal ROS: negative for - joint swelling or muscular weakness Neurological ROS: as noted in HPI Dermatological ROS: negative for rash and skin lesion changes  Physical Examination: Blood pressure (!) 160/83, pulse 87, temperature 97.9 F (36.6 C), temperature source Oral, resp. rate 16, height 5\' 5"  (1.651 m), weight 104.8 kg, SpO2 100 %.  Neurological Examination   Mental Status: Alert, oriented, thought content appropriate.  Speech fluent without evidence of aphasia.  Able to follow 3 step commands without difficulty. Cranial Nerves: II: Discs flat bilaterally; Visual fields grossly normal, pupils equal, round, reactive to light and accommodation III,IV, VI: ptosis not present, extra-ocular motions intact bilaterally V,VII: smile symmetric, facial light touch sensation normal bilaterally VIII: hearing normal bilaterally IX,X: gag reflex present XI: bilateral shoulder shrug XII: midline tongue extension Motor: Right : Upper extremity   5/5    Left:     Upper extremity   5/5  Lower extremity   5/5     Lower extremity   5/5 Tone and bulk:normal tone throughout; no atrophy noted Sensory: Pinprick and light touch intact throughout, bilaterally Deep Tendon Reflexes: 1+ and symmetric throughout Plantars: Right: downgoing   Left: downgoing Cerebellar: Dysmetria on RUE Gait: not tested        Laboratory Studies:   Basic Metabolic Panel: Recent Labs  Lab 06/21/19 0908 06/25/19 0947  NA 140 140  K 4.8 3.7  CL 104 104  CO2 28 25  GLUCOSE 102* 114*  BUN 13 18  CREATININE 0.90 1.03*  CALCIUM 9.6 9.3    Liver Function Tests: Recent Labs  Lab 06/21/19 0908 06/25/19 0947  AST 15 22  ALT 15 19  ALKPHOS 93 85  BILITOT 0.4 0.8  PROT 7.4 8.5*  ALBUMIN 4.3 4.3   No results for input(s): LIPASE, AMYLASE in the last 168 hours. No results for input(s): AMMONIA in the last 168 hours.  CBC: Recent Labs  Lab 06/25/19 0947  WBC 9.5  NEUTROABS 4.9  HGB 14.2  HCT 43.7  MCV 86.4  PLT 481*    Cardiac Enzymes: No results for input(s): CKTOTAL, CKMB, CKMBINDEX, TROPONINI in the last 168 hours.  BNP: Invalid input(s): POCBNP  CBG: No results for input(s): GLUCAP in the last 168 hours.  Microbiology: Results for orders placed or performed during the hospital encounter of 01/09/19  SARS Coronavirus 2 (CEPHEID- Performed in [place] lab), [facility] Order     Status: None  Collection Time: 01/09/19  5:45 PM   Specimen: Nasopharyngeal Swab  Result Value Ref Range Status   SARS Coronavirus 2 NEGATIVE NEGATIVE Final    Comment: (NOTE) If result is NEGATIVE SARS-CoV-2 target nucleic acids are NOT DETECTED. The SARS-CoV-2 RNA is generally detectable in upper and lower  respiratory specimens during the acute phase of infection. The lowest  concentration of SARS-CoV-2 viral copies this assay can detect is 250  copies / mL. A negative result does not preclude SARS-CoV-2 infection  and should not be used as the sole basis for treatment or other  patient management decisions.  A negative result may occur with  improper specimen collection / handling, submission of specimen other  than nasopharyngeal swab, presence of viral mutation(s) within the  areas targeted by this assay, and inadequate number of viral copies  (<250 copies / mL). A negative result must be  combined with clinical  observations, patient history, and epidemiological information. If result is POSITIVE SARS-CoV-2 target nucleic acids are DETECTED. The SARS-CoV-2 RNA is generally detectable in upper and lower  respiratory specimens dur ing the acute phase of infection.  Positive  results are indicative of active infection with SARS-CoV-2.  Clinical  correlation with patient history and other diagnostic information is  necessary to determine patient infection status.  Positive results do  not rule out bacterial infection or co-infection with other viruses. If result is PRESUMPTIVE POSTIVE SARS-CoV-2 nucleic acids MAY BE PRESENT.   A presumptive positive result was obtained on the submitted specimen  and confirmed on repeat testing.  While 2019 novel coronavirus  (SARS-CoV-2) nucleic acids may be present in the submitted sample  additional confirmatory testing may be necessary for epidemiological  and / or clinical management purposes  to differentiate between  SARS-CoV-2 and other Sarbecovirus currently known to infect humans.  If clinically indicated additional testing with an alternate test  methodology [phone]) is advised. The SARS-CoV-2 RNA is generally  detectable in upper and lower respiratory sp ecimens during the acute  phase of infection. The expected result is Negative. Fact Sheet for Patients:  StrictlyIdeas.no Fact Sheet for Healthcare Providers: BankingDealers.co.za This test is not yet approved or cleared by the [place] FDA and has been authorized for detection and/or diagnosis of SARS-CoV-2 by FDA under an Emergency Use Authorization (EUA).  This EUA will remain in effect (meaning this test can be used) for the duration of the COVID-19 declaration under Section 564(b)(1) of the Act, 21 U.S.C. section 360bbb-3(b)(1), unless the authorization is terminated or revoked sooner. Performed at [facility], [address] [place], [place] [address]     Coagulation Studies: No results for input(s): LABPROT, INR in the last 72 hours.  Urinalysis: No results for input(s): COLORURINE, LABSPEC, PHURINE, GLUCOSEU, HGBUR, BILIRUBINUR, KETONESUR, PROTEINUR, UROBILINOGEN, NITRITE, LEUKOCYTESUR in the last 168 hours.  Invalid input(s): APPERANCEUR  Lipid Panel:     Component Value Date/Time   CHOL 218 (H) 06/21/2019 0908   TRIG 166.0 (H) 06/21/2019 0908   HDL 53.90 06/21/2019 0908   CHOLHDL 4 06/21/2019 0908   VLDL 33.2 06/21/2019 0908   LDLCALC 131 (H) 06/21/2019 0908    HgbA1C:  Lab Results  Component Value Date   HGBA1C 6.0 06/21/2019    Urine Drug Screen:  No results found for: LABOPIA, COCAINSCRNUR, LABBENZ, AMPHETMU, THCU, LABBARB  Alcohol Level:  Recent Labs  Lab 06/25/19 [place] <10    Other results: EKG: normal EKG, normal sinus rhythm, unchanged from  previous tracings.  Imaging: Ct Head Wo Contrast  Result Date: 06/25/2019 CLINICAL DATA:  Focal neuro deficit. EXAM: CT HEAD WITHOUT CONTRAST TECHNIQUE: Contiguous axial images were obtained from the base of the skull through the vertex without intravenous contrast. COMPARISON:  MRI head 05/26/2015 FINDINGS: Brain: No evidence of acute infarction, hemorrhage, hydrocephalus, extra-axial collection or mass lesion/mass effect. Vascular: Negative for hyperdense vessel Skull: Negative Sinuses/Orbits: Negative Other: None IMPRESSION: Negative CT head Electronically Signed   By: Franchot Gallo M.D.   On: 06/25/2019 10:21     Assessment/Plan:  68 y.o. female history of anemia, anxiety, asthma, CHF, thrombocytosis, HTN, HLD who presents to the ED for weakness beginning yesterday with difficulty using her legs and walking.  Patient states she was not feeling well yesterday and today felt her balance is off, falling to the right side.  - NIHSS of 1 for dysmetria on RUE - CTH no acute abnormalities - Admission  - MRI brain  and MRA h/n - was on ASA 81mg  at home please increase to ASA 325 daily - PT/OT  Addendum:  MRI and MRA no acute abnormalities. Possible TIA Pt/ot And possible d/c planning in AM 06/25/2019, 11:28 AM

## 2019-06-25 NOTE — ED Triage Notes (Signed)
Pt from home via ems with reports of beginning to feel weak yesterday but this when she attempted to get up to go to bathroom her legs were very weak and shaky. Pt a/o x4. Follows commands appropriately. Denies pain.

## 2019-06-26 ENCOUNTER — Observation Stay: Payer: Medicare Other

## 2019-06-26 DIAGNOSIS — R270 Ataxia, unspecified: Secondary | ICD-10-CM | POA: Diagnosis not present

## 2019-06-26 DIAGNOSIS — I6523 Occlusion and stenosis of bilateral carotid arteries: Secondary | ICD-10-CM | POA: Diagnosis not present

## 2019-06-26 DIAGNOSIS — K219 Gastro-esophageal reflux disease without esophagitis: Secondary | ICD-10-CM | POA: Diagnosis not present

## 2019-06-26 DIAGNOSIS — R29898 Other symptoms and signs involving the musculoskeletal system: Secondary | ICD-10-CM | POA: Diagnosis not present

## 2019-06-26 DIAGNOSIS — I10 Essential (primary) hypertension: Secondary | ICD-10-CM | POA: Diagnosis not present

## 2019-06-26 LAB — LIPID PANEL
Cholesterol: 191 mg/dL (ref 0–200)
HDL: 56 mg/dL (ref >40)
LDL Cholesterol: 105 mg/dL — ABNORMAL HIGH (ref 0–99)
Total CHOL/HDL Ratio: 3.4 RATIO
Triglycerides: 152 mg/dL — ABNORMAL HIGH (ref <150)
VLDL: 30 mg/dL (ref 0–40)

## 2019-06-26 LAB — ECHOCARDIOGRAM COMPLETE
Height: 65 in
Weight: 3696 oz

## 2019-06-26 LAB — HEMOGLOBIN A1C
Hgb A1c MFr Bld: 6 % — ABNORMAL HIGH (ref 4.8–5.6)
Mean Plasma Glucose: 125.5 mg/dL

## 2019-06-26 MED ORDER — LOPERAMIDE HCL 2 MG PO CAPS
4.0000 mg | ORAL_CAPSULE | Freq: Once | ORAL | Status: DC
  Filled 2019-06-26: qty 2

## 2019-06-26 MED ORDER — ONDANSETRON HCL 4 MG/2ML IJ SOLN
4.0000 mg | Freq: Four times a day (QID) | INTRAMUSCULAR | Status: DC | PRN
  Administered 2019-06-26: 4 mg via INTRAVENOUS
  Filled 2019-06-26: qty 2

## 2019-06-26 NOTE — TOC Initial Note (Signed)
Transition of Care [facility]) - Initial/Assessment Note    Patient Details  Name: Nicole Sims MRN: [id number] Date of Birth: October 11, 1950  [facility] CM/SW Contact:    Shelbie Hutching, RN Phone Number: 06/26/2019, 10:25 AM  Clinical Narrative:                 Patient placed under observation for bilateral lower extremity weakness.  Patient is from home where she lives with her husband.  Patient reports that at baseline she is independent in ADL's, drives and requires no assistive devices.  Patient does have a cane and walker from when she had her knees operated on back several years ago but does not currently use them.   Patient does not want to go to SNF and at this time does not want home health services.  RNCM informed patient that if she changes her mind she can go to her PCP and get home health services arranged, patient verbalizes understanding.     Patient reports that she is feeling much better and hopes to go home today. RNCM signed off.   Expected Discharge Plan: Home/Self Care Barriers to Discharge: Continued Medical Work up   Patient Goals and CMS Choice        Expected Discharge Plan and Services Expected Discharge Plan: Home/Self Care   Discharge Planning Services: CM Consult   Living arrangements for the past 2 months: Mobile Home Expected Discharge Date: 06/26/19                                    Prior Living Arrangements/Services Living arrangements for the past 2 months: Mobile Home Lives with:: Spouse Patient language and need for interpreter reviewed:: Yes Do you feel safe going back to the place where you live?: Yes      Need for Family Participation in Patient Care: Yes (Comment) Care giver support system in place?: Yes (comment)(husband)   Criminal Activity/Legal Involvement Pertinent to Current Situation/Hospitalization: No - Comment as needed  Activities of Daily Living Home Assistive Devices/Equipment: Walker (specify type),  Bedside commode/3-in-1, Cane (specify quad or straight) ADL Screening (condition at time of admission) Patient's cognitive ability adequate to safely complete daily activities?: Yes Is the patient deaf or have difficulty hearing?: No Does the patient have difficulty seeing, even when wearing glasses/contacts?: No Does the patient have difficulty concentrating, remembering, or making decisions?: No Patient able to express need for assistance with ADLs?: Yes Does the patient have difficulty dressing or bathing?: No Independently performs ADLs?: Yes (appropriate for developmental age) Does the patient have difficulty walking or climbing stairs?: No Weakness of Legs: Both Weakness of Arms/Hands: None  Permission Sought/Granted Permission sought to share information with : Case Manager Permission granted to share information with : Yes, Verbal Permission Granted              Emotional Assessment Appearance:: Appears stated age Attitude/Demeanor/Rapport: Engaged Affect (typically observed): Accepting Orientation: : Oriented to Self, Oriented to Place, Oriented to  Time, Oriented to Situation Alcohol / Substance Use: Not Applicable Psych Involvement: No (comment)  Admission diagnosis:  Ataxia [R27.0] Lower extremity weakness [R29.898] Patient Active Problem List   Diagnosis Date Noted  . Lower extremity weakness 06/25/2019  . Ataxia   . Joint pain 06/24/2019  . Rib pain on left side 06/21/2019  . Arthropathy, lower leg 05/24/2019  . Iron deficiency anemia 10/12/2018  . Diastolic heart  failure ([place]) 04/10/2018  . SOB (shortness of breath)   . Vitamin D deficiency 04/02/2017  . Leg pain, bilateral 01/10/2017  . Dry eyes 10/24/2016  . Blurred vision 06/07/2016  . Nasal congestion 10/21/2015  . Abdominal pain in female 10/20/2015  . Cough 08/25/2015  . OSA (obstructive sleep apnea) 01/21/2015  . Essential hypertension 01/21/2015  . Lichen sclerosus [id number]  . Chest pain  12/17/2014  . Health care maintenance 12/17/2014  . Stress 02/24/2014  . Sinusitis 02/24/2014  . Spondylosis of cervical region without myelopathy or radiculopathy 01/28/2014  . Myofascial pain 01/25/2014  . Radiculitis, cervical 01/25/2014  . Tachycardia 09/30/2013  . Palpitations 08/21/2012  . Osteoarthritis 08/21/2012  . Combined hyperlipidemia 08/21/2012  . Gastroesophageal reflux disease 08/21/2012  . Thrombocytosis ([place]) 08/21/2012  . Anemia 08/21/2012  . Venous insufficiency of both lower extremities 08/21/2012  . Chronic headaches 08/21/2012  . Hyperglycemia 08/21/2012   PCP:  Einar Pheasant, MD Pharmacy:   [facility] [place], [place] AT [place] [address] Phone: [phone] Fax: [phone]  [id number] [place], [place] [address] [place] [address] Phone: [phone] Fax: [phone]  [employer], [place] [facility] [address] [place] [address] [place] [address] Phone: [phone] Fax: [phone]     Social Determinants of Health ([place]) Interventions    Readmission Risk Interventions No flowsheet data found.

## 2019-06-26 NOTE — Discharge Summary (Signed)
Nicole Sims at [place] NAME: Nicole Sims    MR#:  [id number]  DATE OF BIRTH:  September 26, 1950  DATE OF ADMISSION:  06/25/2019 ADMITTING PHYSICIAN: Fritzi Mandes, MD  DATE OF DISCHARGE: 06/26/2019  PRIMARY CARE PHYSICIAN: Einar Pheasant, MD    ADMISSION DIAGNOSIS:  Ataxia [R27.0] Lower extremity weakness [R29.898]  DISCHARGE DIAGNOSIS:  lower extremity weakness after bilateral L3-four transforaminal epidural injection on 11/62020  SECONDARY DIAGNOSIS:   Past Medical History:  Diagnosis Date  . Anemia   . Anxiety   . Asthma   . Cataract cortical, senile   . CHF (congestive heart failure) ([place])   . Chronic headaches   . Diastolic heart failure ([place])   . Diverticulosis   . Environmental allergies   . GERD (gastroesophageal reflux disease)   . Heart murmur   . Hyperglycemia   . Hyperlipidemia   . Hypertension   . Obesity   . Osteoarthritis   . Palpitations   . Restless leg syndrome   . Sleep apnea   . Thrombocytosis ([place])   . Urinary incontinence    mixed  . Venous insufficiency   . Vitamin D deficiency     HOSPITAL COURSE:  Nicole Sims  is a 69 y.o. female with a known history of \\anxiety , asthma, hypertension, hyperlipidemia, diastolic congestive heart failure chronic comes to the emergency room after she woke up feeling very weak both lower extremity. She tried to walk and felt unsteady on her gait. She mentioned to the ER physician that her right leg was more week.  1. Bilateral lower extremity weakness with unsteady gait  -patient has history of lumbar radiculitis. She follows up with the physiatry's and recently had bilateral L3 - four transforaminal epidural injection which was placed on 6TH NOV,2020. Patient thereafter worked in the yard with a wheelbarrow for several hours in started having weakness in both her lower extremity --CT head negative -MRI/MRA  of the brain negative for stroke -echo shows EF of 60 to  70% -ultrasound carotid no significant stenosis. Some atherosclerosis present. -neurology consultation with Dr. Irish Elders. Recommends aspirin 325 mg daily. - Continue statin -physical therapy, occupational therapy-- recommended rehab. However patient feels she has improved over the 24 hours. She is had made few trips to the bathroom using the walker by herself. She is declining home health PT and RN as well.  2. Hypertension continue metoprolol  3. Hyperlipidemia on statins  4. Jerrye Bushy continue PPI and sucralfate  5. Chronic diastolic congestive heart failure. Patient appears euvolemic. She is on room air. -Continue her Lasix and metoprolol  6. DVT prophylaxis Lovenox  Overall feels hemodynamically stable. She is improving. She does have a walker at home which she is encouraged to use. Husband in the room. They agree with discharge plan. Patient does one go home. Should follow-up with her primary care physician in 1 to 2 weeks.  CONSULTS OBTAINED:  Treatment Team:  Leotis Pain, MD  DRUG ALLERGIES:   Allergies  Allergen Reactions  . Augmentin [Amoxicillin-Pot Clavulanate] Other (See Comments)    Questionable itching  . Bactrim [Sulfamethoxazole-Trimethoprim]   . Doxycycline Itching  . Hydrocodone-Acetaminophen Other (See Comments)    GI distress  . Levaquin [Levofloxacin]   . Pseudoephedrine     Itching of the scalp  . Shellfish Allergy     Nausea and vomiting     DISCHARGE MEDICATIONS:   Allergies as of 06/26/2019      Reactions   Augmentin [amoxicillin-pot  Clavulanate] Other (See Comments)   Questionable itching   Bactrim [sulfamethoxazole-trimethoprim]    Doxycycline Itching   Hydrocodone-acetaminophen Other (See Comments)   GI distress   Levaquin [levofloxacin]    Pseudoephedrine    Itching of the scalp   Shellfish Allergy    Nausea and vomiting      Medication List    TAKE these medications   aspirin EC 81 MG tablet Take 81 mg by mouth daily.    Calcium 500 +D 500-400 MG-UNIT Tabs Generic drug: Calcium Carb-Cholecalciferol Take 1 tablet by mouth daily.   Chromium 1 MG Caps Take 1 mg by mouth daily.   furosemide 20 MG tablet Commonly known as: LASIX Take 1 tablet (20 mg total) by mouth daily.   gabapentin 300 MG capsule Commonly known as: NEURONTIN Take 1 capsule (300 mg total) by mouth at bedtime as needed. What changed: how much to take   Linzess 290 MCG Caps capsule Generic drug: linaclotide Take 290 mcg by mouth daily as needed (GI symptoms).   magnesium oxide 400 MG tablet Commonly known as: MAG-OX Take 400 mg daily by mouth.   metoprolol succinate 100 MG 24 hr tablet Commonly known as: TOPROL-XL Take 1 tablet (100 mg total) by mouth daily. Take with or immediately following a meal.   montelukast 10 MG tablet Commonly known as: SINGULAIR TAKE 1 TABLET BY MOUTH AT  BEDTIME   multivitamin tablet Take 1 tablet by mouth daily.   naproxen sodium 220 MG tablet Commonly known as: ALEVE Take 220-440 mg by mouth daily as needed (pain).   ondansetron 4 MG tablet Commonly known as: ZOFRAN Take 1 tablet (4 mg total) by mouth 2 (two) times daily as needed for nausea or vomiting.   potassium chloride 10 MEQ tablet Commonly known as: KLOR-CON Take 1 tablet (10 mEq total) by mouth daily.   RABEprazole 20 MG tablet Commonly known as: ACIPHEX Take 20 mg by mouth 2 (two) times daily.   rosuvastatin 10 MG tablet Commonly known as: CRESTOR TAKE 1 TABLET BY MOUTH  DAILY What changed:   how much to take  when to take this  additional instructions   sucralfate 1 g tablet Commonly known as: CARAFATE Take 1 g by mouth daily as needed (GI sympotms).   venlafaxine XR 150 MG 24 hr capsule Commonly known as: EFFEXOR-XR TAKE 1 CAPSULE BY MOUTH  DAILY WITH BREAKFAST What changed: See the new instructions.   vitamin B-12 1000 MCG tablet Commonly known as: CYANOCOBALAMIN Take 1,000 mcg by mouth daily.   Vitamin  D-3 25 MCG (1000 UT) Caps Take 1,000 Units by mouth daily.       If you experience worsening of your admission symptoms, develop shortness of breath, life threatening emergency, suicidal or homicidal thoughts you must seek medical attention immediately by calling 911 or calling your MD immediately  if symptoms less severe.  You Must read complete instructions/literature along with all the possible adverse reactions/side effects for all the Medicines you take and that have been prescribed to you. Take any new Medicines after you have completely understood and accept all the possible adverse reactions/side effects.   Please note  You were cared for by a hospitalist during your hospital stay. If you have any questions about your discharge medications or the care you received while you were in the hospital after you are discharged, you can call the unit and asked to speak with the hospitalist on call if the hospitalist that took care of  you is not available. Once you are discharged, your primary care physician will handle any further medical issues. Please note that NO REFILLS for any discharge medications will be authorized once you are discharged, as it is imperative that you return to your primary care physician (or establish a relationship with a primary care physician if you do not have one) for your aftercare needs so that they can reassess your need for medications and monitor your lab values. Today   SUBJECTIVE   Feels a lot better. Can I go home.  Husband in the room  VITAL SIGNS:  Blood pressure 137/82, pulse 73, temperature 97.6 F (36.4 C), temperature source Oral, resp. rate 18, height 5\' 5"  (1.651 m), weight 104.6 kg, SpO2 99 %.  I/O:    Intake/Output Summary (Last 24 hours) at 06/26/2019 1232 Last data filed at 06/26/2019 0930 Gross per 24 hour  Intake 240 ml  Output 5 ml  Net 235 ml    PHYSICAL EXAMINATION:  GENERAL:  68 y.o.-year-old patient lying in the bed with no  acute distress.  EYES: Pupils equal, round, reactive to light and accommodation. No scleral icterus. Extraocular muscles intact.  HEENT: Head atraumatic, normocephalic. Oropharynx and nasopharynx clear.  NECK:  Supple, no jugular venous distention. No thyroid enlargement, no tenderness.  LUNGS: Normal breath sounds bilaterally, no wheezing, rales,rhonchi or crepitation. No use of accessory muscles of respiration.  CARDIOVASCULAR: S1, S2 normal. No murmurs, rubs, or gallops.  ABDOMEN: Soft, non-tender, non-distended. Bowel sounds present. No organomegaly or mass.  EXTREMITIES: No pedal edema, cyanosis, or clubbing.  NEUROLOGIC: Cranial nerves II through XII are intact. Muscle strength 5/5 in all extremities. Sensation intact. Gait not checked.  PSYCHIATRIC: The patient is alert and oriented x 3.  SKIN: No obvious rash, lesion, or ulcer.   DATA REVIEW:   CBC  Recent Labs  Lab 06/25/19 0947  WBC 9.5  HGB 14.2  HCT 43.7  PLT 481*    Chemistries  Recent Labs  Lab 06/25/19 0947  NA 140  K 3.7  CL 104  CO2 25  GLUCOSE 114*  BUN 18  CREATININE 1.03*  CALCIUM 9.3  AST 22  ALT 19  ALKPHOS 85  BILITOT 0.8    Microbiology Results   Recent Results (from the past 240 hour(s))  SARS CORONAVIRUS 2 (TAT 6-24 HRS) Nasopharyngeal Nasopharyngeal Swab     Status: None   Collection Time: 06/25/19 10:54 AM   Specimen: Nasopharyngeal Swab  Result Value Ref Range Status   SARS Coronavirus 2 NEGATIVE NEGATIVE Final    Comment: (NOTE) SARS-CoV-2 target nucleic acids are NOT DETECTED. The SARS-CoV-2 RNA is generally detectable in upper and lower respiratory specimens during the acute phase of infection. Negative results do not preclude SARS-CoV-2 infection, do not rule out co-infections with other pathogens, and should not be used as the sole basis for treatment or other patient management decisions. Negative results must be combined with clinical observations, patient history, and  epidemiological information. The expected result is Negative. Fact Sheet for Patients: SugarRoll.be Fact Sheet for Healthcare Providers: https://www.woods-mathews.com/ This test is not yet approved or cleared by the [place] FDA and  has been authorized for detection and/or diagnosis of SARS-CoV-2 by FDA under an Emergency Use Authorization (EUA). This EUA will remain  in effect (meaning this test can be used) for the duration of the COVID-19 declaration under Section 56 4(b)(1) of the Act, 21 U.S.C. section 360bbb-3(b)(1), unless the authorization is terminated or revoked sooner. Performed  at [facility], [place] [address], [place], [place] [address]     RADIOLOGY:  Ct Head Wo Contrast  Result Date: 06/25/2019 CLINICAL DATA:  Focal neuro deficit. EXAM: CT HEAD WITHOUT CONTRAST TECHNIQUE: Contiguous axial images were obtained from the base of the skull through the vertex without intravenous contrast. COMPARISON:  MRI head 05/26/2015 FINDINGS: Brain: No evidence of acute infarction, hemorrhage, hydrocephalus, extra-axial collection or mass lesion/mass effect. Vascular: Negative for hyperdense vessel Skull: Negative Sinuses/Orbits: Negative Other: None IMPRESSION: Negative CT head Electronically Signed   By: Franchot Gallo M.D.   On: 06/25/2019 10:21   Mr Angio Head Wo Contrast  Result Date: 06/25/2019 CLINICAL DATA:  Ataxia, stroke suspected. Cerebral aneurysm, subarachnoid hemorrhage, cerebral vasospasm evaluation. Additional history provided: Patient presents to ED for weakness beginning yesterday with difficulty walking. EXAM: MRI HEAD WITHOUT CONTRAST MRA HEAD WITHOUT CONTRAST TECHNIQUE: Multiplanar, multiecho pulse sequences of the brain and surrounding structures were obtained without intravenous contrast. Angiographic images of the head were obtained using MRA technique without contrast. COMPARISON:  Head CT 06/25/2019, brain MRI  05/26/2015 FINDINGS: MRI HEAD FINDINGS Brain: There is no convincing evidence of acute infarct. No evidence of intracranial mass. No midline shift or extra-axial fluid collection. No chronic intracranial blood products. Mild scattered T2/FLAIR hyperintensity within the cerebral white matter is nonspecific, but consistent with chronic small vessel ischemic disease. Cerebral volume is normal for age. Vascular: Reported separately Skull and upper cervical spine: No focal marrow lesion Sinuses/Orbits: Visualized orbits demonstrate no acute abnormality. Trace ethmoid sinus mucosal thickening. No significant mastoid effusion. MRA HEAD FINDINGS The intracranial internal carotid arteries are patent with mild atherosclerotic irregularity. The bilateral middle and anterior cerebral arteries are patent without significant proximal stenosis. No intracranial aneurysm is identified. The intracranial vertebral arteries are patent. Apparent moderate focal stenosis within the intracranial right vertebral artery. The basilar artery is patent without significant stenosis. The right posterior cerebral artery is patent without significant proximal stenosis. A right posterior communicating artery is present. Predominantly fetal origin of the left posterior cerebral artery, which is patent without significant proximal stenosis. IMPRESSION: MRI brain: 1. No evidence of acute intracranial abnormality. 2. Mild chronic small vessel ischemic disease. MRA head: 1. No intracranial large vessel occlusion. 2. Moderate focal stenosis within the intracranial right vertebral artery. 3. Mild atherosclerotic irregularity of the intracranial internal carotid arteries. 4. No intracranial aneurysm identified. Electronically Signed   By: Kellie Simmering DO   On: 06/25/2019 13:11   Mr Brain Wo Contrast  Result Date: 06/25/2019 CLINICAL DATA:  Ataxia, stroke suspected. Cerebral aneurysm, subarachnoid hemorrhage, cerebral vasospasm evaluation. Additional  history provided: Patient presents to ED for weakness beginning yesterday with difficulty walking. EXAM: MRI HEAD WITHOUT CONTRAST MRA HEAD WITHOUT CONTRAST TECHNIQUE: Multiplanar, multiecho pulse sequences of the brain and surrounding structures were obtained without intravenous contrast. Angiographic images of the head were obtained using MRA technique without contrast. COMPARISON:  Head CT 06/25/2019, brain MRI 05/26/2015 FINDINGS: MRI HEAD FINDINGS Brain: There is no convincing evidence of acute infarct. No evidence of intracranial mass. No midline shift or extra-axial fluid collection. No chronic intracranial blood products. Mild scattered T2/FLAIR hyperintensity within the cerebral white matter is nonspecific, but consistent with chronic small vessel ischemic disease. Cerebral volume is normal for age. Vascular: Reported separately Skull and upper cervical spine: No focal marrow lesion Sinuses/Orbits: Visualized orbits demonstrate no acute abnormality. Trace ethmoid sinus mucosal thickening. No significant mastoid effusion. MRA HEAD FINDINGS The intracranial internal carotid arteries are  patent with mild atherosclerotic irregularity. The bilateral middle and anterior cerebral arteries are patent without significant proximal stenosis. No intracranial aneurysm is identified. The intracranial vertebral arteries are patent. Apparent moderate focal stenosis within the intracranial right vertebral artery. The basilar artery is patent without significant stenosis. The right posterior cerebral artery is patent without significant proximal stenosis. A right posterior communicating artery is present. Predominantly fetal origin of the left posterior cerebral artery, which is patent without significant proximal stenosis. IMPRESSION: MRI brain: 1. No evidence of acute intracranial abnormality. 2. Mild chronic small vessel ischemic disease. MRA head: 1. No intracranial large vessel occlusion. 2. Moderate focal stenosis  within the intracranial right vertebral artery. 3. Mild atherosclerotic irregularity of the intracranial internal carotid arteries. 4. No intracranial aneurysm identified. Electronically Signed   By: Kellie Simmering DO   On: 06/25/2019 13:11   US Carotid Bilateral (at [facility] Only)  Result Date: 06/26/2019 CLINICAL DATA:  Lower extremity weakness, ataxia, hypertension EXAM: BILATERAL CAROTID DUPLEX ULTRASOUND TECHNIQUE: Pearline Cables scale imaging, color Doppler and duplex ultrasound were performed of bilateral carotid and vertebral arteries in the neck. COMPARISON:  None. FINDINGS: Criteria: Quantification of carotid stenosis is based on velocity parameters that correlate the residual internal carotid diameter with NASCET-based stenosis levels, using the diameter of the distal internal carotid lumen as the denominator for stenosis measurement. The following velocity measurements were obtained: RIGHT ICA: 64/20 cm/sec CCA: [id number] cm/sec SYSTOLIC ICA/CCA RATIO:  0.9 ECA: 65 cm/sec LEFT ICA: 67/20 cm/sec CCA: [id number] cm/sec SYSTOLIC ICA/CCA RATIO:  1.0 ECA: 97 cm/sec RIGHT CAROTID ARTERY: Minor echogenic shadowing plaque formation. No hemodynamically significant right ICA stenosis, velocity elevation, or turbulent flow. Degree of narrowing less than 50%. RIGHT VERTEBRAL ARTERY:  Antegrade LEFT CAROTID ARTERY: Similar scattered minor echogenic plaque formation. No hemodynamically significant left ICA stenosis, velocity elevation, or turbulent flow. LEFT VERTEBRAL ARTERY:  Antegrade IMPRESSION: Minor carotid atherosclerosis. No hemodynamically significant ICA stenosis. Degree of narrowing less than 50% bilaterally by ultrasound criteria. Patent antegrade vertebral flow bilaterally Electronically Signed   By: Jerilynn Mages.  Shick M.D.   On: 06/26/2019 08:48     CODE STATUS:     Code Status Orders  (From admission, onward)         Start     Ordered   06/25/19 1353  Full code  Continuous     06/25/19 1353        Code  Status History    Date Active Date Inactive Code Status Order ID Comments User Context   04/10/2018 1036 04/10/2018 1907 Full Code [id number]  Wellington Hampshire, MD Inpatient   Advance Care Planning Activity    Advance Directive Documentation     Most Recent Value  Type of Advance Directive  Healthcare Power of [place], Living will  Pre-existing out of facility DNR order (yellow form or pink MOST form)  -  "MOST" Form in Place?  -      TOTAL TIME TAKING CARE OF THIS PATIENT: *40* minutes.    Fritzi Mandes M.D on 06/26/2019 at 12:32 PM  Between 7am to 6pm - Pager - [phone] After 6pm go to [email or web address] - password [email or web address]  [facility]    CC: Primary care physician; Einar Pheasant, MD

## 2019-06-26 NOTE — Progress Notes (Signed)
Subjective:  Patient states she is feeling better.  No acute abnormalities seen on MRI/MRA  Past Medical History:  Diagnosis Date  . Anemia   . Anxiety   . Asthma   . Cataract cortical, senile   . CHF (congestive heart failure) ([place])   . Chronic headaches   . Diastolic heart failure ([place])   . Diverticulosis   . Environmental allergies   . GERD (gastroesophageal reflux disease)   . Heart murmur   . Hyperglycemia   . Hyperlipidemia   . Hypertension   . Obesity   . Osteoarthritis   . Palpitations   . Restless leg syndrome   . Sleep apnea   . Thrombocytosis ([place])   . Urinary incontinence    mixed  . Venous insufficiency   . Vitamin D deficiency     Past Surgical History:  Procedure Laterality Date  . BLADDER SURGERY     x2   [place] [facility]  . BREAST CYST ASPIRATION Bilateral 2005   approximate year  . CARDIAC CATHETERIZATION     Nehemiah Massed  . CERVICAL CONE BIOPSY     CIS  . CHOLECYSTECTOMY    . COLONOSCOPY WITH PROPOFOL N/A 07/19/2016   Procedure: COLONOSCOPY WITH PROPOFOL;  Surgeon: Manya Silvas, MD;  Location: [facility] ENDOSCOPY;  Service: Endoscopy;  Laterality: N/A;  . COLONOSCOPY WITH PROPOFOL N/A 10/20/2018   Procedure: COLONOSCOPY WITH PROPOFOL;  Surgeon: Lollie Sails, MD;  Location: [facility] ENDOSCOPY;  Service: Endoscopy;  Laterality: N/A;  . ESOPHAGOGASTRODUODENOSCOPY (EGD) WITH PROPOFOL N/A 07/19/2016   Procedure: ESOPHAGOGASTRODUODENOSCOPY (EGD) WITH PROPOFOL;  Surgeon: Manya Silvas, MD;  Location: [facility] ENDOSCOPY;  Service: Endoscopy;  Laterality: N/A;  . FLEXIBLE SIGMOIDOSCOPY    . KNEE ARTHROSCOPY  08/13/08  . knee replacement and revision     left  . RECTOCELE REPAIR    . RIGHT/LEFT HEART CATH AND CORONARY ANGIOGRAPHY Bilateral 04/10/2018   Procedure: RIGHT/LEFT HEART CATH AND CORONARY ANGIOGRAPHY;  Surgeon: Wellington Hampshire, MD;  Location: [place] CV LAB;  Service: Cardiovascular;  Laterality: Bilateral;  . ROTATOR CUFF REPAIR      bilateral  . SHOULDER SURGERY  11/17/05  . TONSILLECTOMY  1962  . VAGINAL HYSTERECTOMY  1974   abnormal pap and carcinoma in situ    Family History  Problem Relation Age of Onset  . Diabetes Mellitus II Father   . Thyroid disease Father   . Breast cancer Maternal Aunt   . Hypertension Mother   . Heart Problems Brother   . Leukemia Maternal Aunt   . Colon cancer Neg Hx   . Kidney cancer Neg Hx   . Bladder Cancer Neg Hx     Social History:  reports that she quit smoking about 35 years ago. She has never used smokeless tobacco. She reports current alcohol use. She reports that she does not use drugs.  Allergies  Allergen Reactions  . Augmentin [Amoxicillin-Pot Clavulanate] Other (See Comments)    Questionable itching  . Bactrim [Sulfamethoxazole-Trimethoprim]   . Doxycycline Itching  . Hydrocodone-Acetaminophen Other (See Comments)    GI distress  . Levaquin [Levofloxacin]   . Pseudoephedrine     Itching of the scalp  . Shellfish Allergy     Nausea and vomiting     Medications: I have reviewed the patient's current medications.  ROS: History obtained from the patient  General ROS: negative for - chills, fatigue, fever, night sweats, weight gain or weight loss Psychological ROS: negative for - behavioral disorder,  hallucinations, memory difficulties, mood swings or suicidal ideation Ophthalmic ROS: negative for - blurry vision, double vision, eye pain or loss of vision ENT ROS: negative for - epistaxis, nasal discharge, oral lesions, sore throat, tinnitus or vertigo Allergy and Immunology ROS: negative for - hives or itchy/watery eyes Hematological and Lymphatic ROS: negative for - bleeding problems, bruising or swollen lymph nodes Endocrine ROS: negative for - galactorrhea, hair pattern changes, polydipsia/polyuria or temperature intolerance Respiratory ROS: negative for - cough, hemoptysis, shortness of breath or wheezing Cardiovascular ROS: negative for - chest pain,  dyspnea on exertion, edema or irregular heartbeat Gastrointestinal ROS: negative for - abdominal pain, diarrhea, hematemesis, nausea/vomiting or stool incontinence Genito-Urinary ROS: negative for - dysuria, hematuria, incontinence or urinary frequency/urgency Musculoskeletal ROS: negative for - joint swelling or muscular weakness Neurological ROS: as noted in HPI Dermatological ROS: negative for rash and skin lesion changes  Physical Examination: Blood pressure 137/82, pulse 73, temperature 97.6 F (36.4 C), temperature source Oral, resp. rate 18, height 5\' 5"  (1.651 m), weight 104.6 kg, SpO2 99 %.  Neurological Examination   Mental Status: Alert, oriented, thought content appropriate.  Speech fluent without evidence of aphasia.  Able to follow 3 step commands without difficulty. Cranial Nerves: II: Discs flat bilaterally; Visual fields grossly normal, pupils equal, round, reactive to light and accommodation III,IV, VI: ptosis not present, extra-ocular motions intact bilaterally V,VII: smile symmetric, facial light touch sensation normal bilaterally VIII: hearing normal bilaterally IX,X: gag reflex present XI: bilateral shoulder shrug XII: midline tongue extension Motor: Right : Upper extremity   5/5    Left:     Upper extremity   5/5  Lower extremity   5/5     Lower extremity   5/5 Tone and bulk:normal tone throughout; no atrophy noted Sensory: Pinprick and light touch intact throughout, bilaterally Deep Tendon Reflexes: 1+ and symmetric throughout Plantars: Right: downgoing   Left: downgoing Cerebellar: Dysmetria on RUE resolved  Gait: not tested       Laboratory Studies:   Basic Metabolic Panel: Recent Labs  Lab 06/21/19 0908 06/25/19 0947  NA 140 140  K 4.8 3.7  CL 104 104  CO2 28 25  GLUCOSE 102* 114*  BUN 13 18  CREATININE 0.90 1.03*  CALCIUM 9.6 9.3    Liver Function Tests: Recent Labs  Lab 06/21/19 0908 06/25/19 0947  AST 15 22  ALT 15 19  ALKPHOS  93 85  BILITOT 0.4 0.8  PROT 7.4 8.5*  ALBUMIN 4.3 4.3   No results for input(s): LIPASE, AMYLASE in the last 168 hours. No results for input(s): AMMONIA in the last 168 hours.  CBC: Recent Labs  Lab 06/25/19 0947  WBC 9.5  NEUTROABS 4.9  HGB 14.2  HCT 43.7  MCV 86.4  PLT 481*    Cardiac Enzymes: No results for input(s): CKTOTAL, CKMB, CKMBINDEX, TROPONINI in the last 168 hours.  BNP: Invalid input(s): POCBNP  CBG: No results for input(s): GLUCAP in the last 168 hours.  Microbiology: Results for orders placed or performed during the hospital encounter of 06/25/19  SARS CORONAVIRUS 2 (TAT 6-24 HRS) Nasopharyngeal Nasopharyngeal Swab     Status: None   Collection Time: 06/25/19 10:54 AM   Specimen: Nasopharyngeal Swab  Result Value Ref Range Status   SARS Coronavirus 2 NEGATIVE NEGATIVE Final    Comment: (NOTE) SARS-CoV-2 target nucleic acids are NOT DETECTED. The SARS-CoV-2 RNA is generally detectable in upper and lower respiratory specimens during the acute phase of infection. Negative  results do not preclude SARS-CoV-2 infection, do not rule out co-infections with other pathogens, and should not be used as the sole basis for treatment or other patient management decisions. Negative results must be combined with clinical observations, patient history, and epidemiological information. The expected result is Negative. Fact Sheet for Patients: SugarRoll.be Fact Sheet for Healthcare Providers: https://www.woods-mathews.com/ This test is not yet approved or cleared by the [place] FDA and  has been authorized for detection and/or diagnosis of SARS-CoV-2 by FDA under an Emergency Use Authorization (EUA). This EUA will remain  in effect (meaning this test can be used) for the duration of the COVID-19 declaration under Section 56 4(b)(1) of the Act, 21 U.S.C. section 360bbb-3(b)(1), unless the authorization is terminated  or revoked sooner. Performed at [facility], [place] [address] [place], [place] [address]     Coagulation Studies: No results for input(s): LABPROT, INR in the last 72 hours.  Urinalysis:  Recent Labs  Lab 06/25/19 1324  COLORURINE YELLOW*  LABSPEC 1.021  PHURINE 6.0  GLUCOSEU NEGATIVE  HGBUR NEGATIVE  BILIRUBINUR NEGATIVE  KETONESUR NEGATIVE  PROTEINUR NEGATIVE  NITRITE NEGATIVE  LEUKOCYTESUR NEGATIVE    Lipid Panel:     Component Value Date/Time   CHOL 191 06/26/2019 0510   TRIG 152 (H) 06/26/2019 0510   HDL 56 06/26/2019 0510   CHOLHDL 3.4 06/26/2019 0510   VLDL 30 06/26/2019 0510   LDLCALC 105 (H) 06/26/2019 0510    HgbA1C:  Lab Results  Component Value Date   HGBA1C 6.0 (H) 06/26/2019    Urine Drug Screen:      Component Value Date/Time   LABOPIA NONE DETECTED 06/25/2019 1324   COCAINSCRNUR NONE DETECTED 06/25/2019 1324   LABBENZ NONE DETECTED 06/25/2019 1324   AMPHETMU NONE DETECTED 06/25/2019 1324   THCU NONE DETECTED 06/25/2019 1324   LABBARB NONE DETECTED 06/25/2019 1324    Alcohol Level:  Recent Labs  Lab 06/25/19 0947  ETH <10    Other results: EKG: normal EKG, normal sinus rhythm, unchanged from previous tracings.  Imaging: Ct Head Wo Contrast  Result Date: 06/25/2019 CLINICAL DATA:  Focal neuro deficit. EXAM: CT HEAD WITHOUT CONTRAST TECHNIQUE: Contiguous axial images were obtained from the base of the skull through the vertex without intravenous contrast. COMPARISON:  MRI head 05/26/2015 FINDINGS: Brain: No evidence of acute infarction, hemorrhage, hydrocephalus, extra-axial collection or mass lesion/mass effect. Vascular: Negative for hyperdense vessel Skull: Negative Sinuses/Orbits: Negative Other: None IMPRESSION: Negative CT head Electronically Signed   By: Franchot Gallo M.D.   On: 06/25/2019 10:21   Mr Angio Head Wo Contrast  Result Date: 06/25/2019 CLINICAL DATA:  Ataxia, stroke suspected. Cerebral aneurysm, subarachnoid  hemorrhage, cerebral vasospasm evaluation. Additional history provided: Patient presents to ED for weakness beginning yesterday with difficulty walking. EXAM: MRI HEAD WITHOUT CONTRAST MRA HEAD WITHOUT CONTRAST TECHNIQUE: Multiplanar, multiecho pulse sequences of the brain and surrounding structures were obtained without intravenous contrast. Angiographic images of the head were obtained using MRA technique without contrast. COMPARISON:  Head CT 06/25/2019, brain MRI 05/26/2015 FINDINGS: MRI HEAD FINDINGS Brain: There is no convincing evidence of acute infarct. No evidence of intracranial mass. No midline shift or extra-axial fluid collection. No chronic intracranial blood products. Mild scattered T2/FLAIR hyperintensity within the cerebral white matter is nonspecific, but consistent with chronic small vessel ischemic disease. Cerebral volume is normal for age. Vascular: Reported separately Skull and upper cervical spine: No focal marrow lesion Sinuses/Orbits: Visualized orbits demonstrate no acute abnormality. Trace  ethmoid sinus mucosal thickening. No significant mastoid effusion. MRA HEAD FINDINGS The intracranial internal carotid arteries are patent with mild atherosclerotic irregularity. The bilateral middle and anterior cerebral arteries are patent without significant proximal stenosis. No intracranial aneurysm is identified. The intracranial vertebral arteries are patent. Apparent moderate focal stenosis within the intracranial right vertebral artery. The basilar artery is patent without significant stenosis. The right posterior cerebral artery is patent without significant proximal stenosis. A right posterior communicating artery is present. Predominantly fetal origin of the left posterior cerebral artery, which is patent without significant proximal stenosis. IMPRESSION: MRI brain: 1. No evidence of acute intracranial abnormality. 2. Mild chronic small vessel ischemic disease. MRA head: 1. No intracranial  large vessel occlusion. 2. Moderate focal stenosis within the intracranial right vertebral artery. 3. Mild atherosclerotic irregularity of the intracranial internal carotid arteries. 4. No intracranial aneurysm identified. Electronically Signed   By: Kellie Simmering DO   On: 06/25/2019 13:11   Mr Brain Wo Contrast  Result Date: 06/25/2019 CLINICAL DATA:  Ataxia, stroke suspected. Cerebral aneurysm, subarachnoid hemorrhage, cerebral vasospasm evaluation. Additional history provided: Patient presents to ED for weakness beginning yesterday with difficulty walking. EXAM: MRI HEAD WITHOUT CONTRAST MRA HEAD WITHOUT CONTRAST TECHNIQUE: Multiplanar, multiecho pulse sequences of the brain and surrounding structures were obtained without intravenous contrast. Angiographic images of the head were obtained using MRA technique without contrast. COMPARISON:  Head CT 06/25/2019, brain MRI 05/26/2015 FINDINGS: MRI HEAD FINDINGS Brain: There is no convincing evidence of acute infarct. No evidence of intracranial mass. No midline shift or extra-axial fluid collection. No chronic intracranial blood products. Mild scattered T2/FLAIR hyperintensity within the cerebral white matter is nonspecific, but consistent with chronic small vessel ischemic disease. Cerebral volume is normal for age. Vascular: Reported separately Skull and upper cervical spine: No focal marrow lesion Sinuses/Orbits: Visualized orbits demonstrate no acute abnormality. Trace ethmoid sinus mucosal thickening. No significant mastoid effusion. MRA HEAD FINDINGS The intracranial internal carotid arteries are patent with mild atherosclerotic irregularity. The bilateral middle and anterior cerebral arteries are patent without significant proximal stenosis. No intracranial aneurysm is identified. The intracranial vertebral arteries are patent. Apparent moderate focal stenosis within the intracranial right vertebral artery. The basilar artery is patent without significant  stenosis. The right posterior cerebral artery is patent without significant proximal stenosis. A right posterior communicating artery is present. Predominantly fetal origin of the left posterior cerebral artery, which is patent without significant proximal stenosis. IMPRESSION: MRI brain: 1. No evidence of acute intracranial abnormality. 2. Mild chronic small vessel ischemic disease. MRA head: 1. No intracranial large vessel occlusion. 2. Moderate focal stenosis within the intracranial right vertebral artery. 3. Mild atherosclerotic irregularity of the intracranial internal carotid arteries. 4. No intracranial aneurysm identified. Electronically Signed   By: Kellie Simmering DO   On: 06/25/2019 13:11   US Carotid Bilateral (at [facility] Only)  Result Date: 06/26/2019 CLINICAL DATA:  Lower extremity weakness, ataxia, hypertension EXAM: BILATERAL CAROTID DUPLEX ULTRASOUND TECHNIQUE: Pearline Cables scale imaging, color Doppler and duplex ultrasound were performed of bilateral carotid and vertebral arteries in the neck. COMPARISON:  None. FINDINGS: Criteria: Quantification of carotid stenosis is based on velocity parameters that correlate the residual internal carotid diameter with NASCET-based stenosis levels, using the diameter of the distal internal carotid lumen as the denominator for stenosis measurement. The following velocity measurements were obtained: RIGHT ICA: 64/20 cm/sec CCA: [id number] cm/sec SYSTOLIC ICA/CCA RATIO:  0.9 ECA: 65 cm/sec LEFT ICA: 67/20 cm/sec CCA: [id number] cm/sec SYSTOLIC ICA/CCA  RATIO:  1.0 ECA: 97 cm/sec RIGHT CAROTID ARTERY: Minor echogenic shadowing plaque formation. No hemodynamically significant right ICA stenosis, velocity elevation, or turbulent flow. Degree of narrowing less than 50%. RIGHT VERTEBRAL ARTERY:  Antegrade LEFT CAROTID ARTERY: Similar scattered minor echogenic plaque formation. No hemodynamically significant left ICA stenosis, velocity elevation, or turbulent flow. LEFT VERTEBRAL  ARTERY:  Antegrade IMPRESSION: Minor carotid atherosclerosis. No hemodynamically significant ICA stenosis. Degree of narrowing less than 50% bilaterally by ultrasound criteria. Patent antegrade vertebral flow bilaterally Electronically Signed   By: Jerilynn Mages.  Shick M.D.   On: 06/26/2019 08:48     Assessment/Plan:  68 y.o. female history of anemia, anxiety, asthma, CHF, thrombocytosis, HTN, HLD who presents to the ED for weakness beginning yesterday with difficulty using her legs and walking.  Patient states she was not feeling well yesterday and today felt her balance is off, falling to the right side.  - Imaging without any signs of acute abnormality along with MRA - Pt states she was able to stand up and walk to the bathroom - PT recommends rehab, but would like to see re evaluation for possible d/c with home health PT - Continue ASA 325 daily - d/c planning   06/26/2019, 10:52 AM

## 2019-06-26 NOTE — Progress Notes (Cosign Needed)
[facility]  Occupational Therapy Certification  Patient Details  Name: CASSADI MOFFA MRN: [id number] Date of Birth: [birth year] Medical Diagnosis: Active Problems:   Gastroesophageal reflux disease   Essential hypertension   Lower extremity weakness  Visit Diagnosis: OT Visit Diagnosis: History of falling (Z91.81) OT Problem: Decreased activity tolerance, Impaired balance (sitting and/or standing)  Goals: Pt Will Transfer to Toilet: Independently Pt Will Perform Toileting - Clothing Manipulation and hygiene: Independently Additional ADL Goal #1: Pt will demo good safety awareness with UB/LB dressing in sit to/from stand I'ly.  Duration: Services will be provided through the following date:    Frequency:  once  Amount: One treatment session per day unless otherwise indicated:  Certification Start Date: [id number] Certification End Date:  06/26/2019   OT Treatments/Interventions: education re: fall prevention strategies with standing ADLs/ADL mobility.   Jake Church Carita Sollars 06/26/2019, 2:17 PM

## 2019-06-26 NOTE — Progress Notes (Signed)
IV removed before discharged. Educated patient on medication and discharge orders. Patient did not have any questions and stated that she understood.

## 2019-06-26 NOTE — Evaluation (Signed)
Occupational Therapy Evaluation Patient Details Name: Nicole Sims MRN: [id number] DOB: [birth year] Today's Date: 06/26/2019    History of Present Illness Nicole Sims is a 12yoF who comes to [facility] on 11/9 after acute onset B UE weakness and gait ataxia. Pt has has workup from neurology with negative MRI of brain (for acute infarct). Pt recently had bone marrow biopsy, as well as bilat lumbar spine injections 3DA for DJD of lumbar spine. PTA pt was an independent community ambulator without assistive device. Pt sustained 1 fall in the past 6 months.   Clinical Impression   Pt seen for OT evaluation this date. Prior to hospital admission, pt was I with all ADLs/IADLs including driving and yard work.  Pt lives in mobile home with 4 STE with her spouse.  Currently pt demonstrates little to no standing balance impairment, reporting it appears to have resolved versus yesterday. OT provides supv throughout assessment for pt safety, but pt does not require any physical assistance for balance with static and dynamic standing to complete ADLs. OT provides education with pt and spouse re: general fall prevention strategies to take into consideration for the home, with both parties verbalizing understanding. No further OT needs detected at this time.    Follow Up Recommendations  No OT follow up    Equipment Recommendations  None recommended by OT    Recommendations for Other Services       Precautions / Restrictions Precautions Precautions: Fall Restrictions Weight Bearing Restrictions: No      Mobility Bed Mobility Overal bed mobility: Modified Independent Bed Mobility: Supine to Sit;Sit to Supine     Supine to sit: Modified independent (Device/Increase time) Sit to supine: Modified independent (Device/Increase time)      Transfers Overall transfer level: Needs assistance Equipment used: None Transfers: Sit to/from Stand Sit to Stand: Supervision         General transfer  comment: no difficluty rising, demos G standing balance once she acheives come-to-stand. Initially with RW, but pt does not appear to require UE support. Attempted w/o RW with no difficulties at [facility] for safety, and then at supv level with no LOB on t/f.    Balance Overall balance assessment: No apparent balance deficits (not formally assessed)   Sitting balance-Leahy Scale: Normal       Standing balance-Leahy Scale: Good Standing balance comment: w/ no AD, demos no need for UE support, no notable LOB With static or dynamic standing.                           ADL either performed or assessed with clinical judgement   ADL Overall ADL's : Modified independent                                       General ADL Comments: Pt performs fxl mobility with CGA initially, then Supv due to G standing and fxl mobility balance. Pt performs commode t/f and clothing mgt/peri care with supv, stands sink-side to wash hands with supv. OT assesses fxl mobility w/o AD with CGA initially, and pt with no LOB and requires no hands on assistance with upgrade to supv w/o AD. Pt able to perform seated UB/LB ADLs I'ly. Demos good safety awareness.     Vision Patient Visual Report: No change from baseline       Perception     Praxis  Pertinent Vitals/Pain Pain Assessment: No/denies pain     Hand Dominance Left   Extremity/Trunk Assessment Upper Extremity Assessment Upper Extremity Assessment: RUE deficits/detail;LUE deficits/detail RUE Deficits / Details: grossly >3/5 LUE Deficits / Details: grossly >3/5   Lower Extremity Assessment Lower Extremity Assessment: Overall WFL for tasks assessed   Cervical / Trunk Assessment Cervical / Trunk Assessment: Normal   Communication Communication Communication: No difficulties   Cognition Arousal/Alertness: Awake/alert Behavior During Therapy: WFL for tasks assessed/performed Overall Cognitive Status: Within Functional Limits  for tasks assessed                                     General Comments       Exercises Other Exercises Other Exercises: OT facilitates education re: fall prevention strategies for home and pt and husband who is present throughout, verbalize understanding.   Shoulder Instructions      Home Living Family/patient expects to be discharged to:: Private residence Living Arrangements: Spouse/significant other Available Help at Discharge: Family Type of Home: Mobile home Home Access: Stairs to enter Entrance Stairs-Number of Steps: 4 Entrance Stairs-Rails: Right;Left;Can reach both Home Layout: One level     Bathroom Shower/Tub: Teacher, early years/pre: [place]: Bedside commode;Walker - 2 wheels;Cane - single point          Prior Functioning/Environment Level of Independence: Independent                 OT Problem List: Decreased activity tolerance;Impaired balance (sitting and/or standing)      OT Treatment/Interventions:      OT Goals(Current goals can be found in the care plan section) Acute Rehab OT Goals OT Goal Formulation: All assessment and education complete, DC therapy  OT Frequency:     Barriers to D/C:            Co-evaluation              AM-PAC OT "6 Clicks" Daily Activity     Outcome Measure Help from another person eating meals?: None Help from another person taking care of personal grooming?: None Help from another person toileting, which includes using toliet, bedpan, or urinal?: None Help from another person bathing (including washing, rinsing, drying)?: A Little Help from another person to put on and taking off regular upper body clothing?: None Help from another person to put on and taking off regular lower body clothing?: None 6 Click Score: 23   End of Session Equipment Utilized During Treatment: Gait belt  Activity Tolerance: Patient tolerated treatment well Patient left: in  bed;with call bell/phone within reach;with family/visitor present  OT Visit Diagnosis: History of falling (Z91.81)                Time: 1250-1313 OT Time Calculation (min): 23 min Charges:  OT General Charges $OT Visit: 1 Visit OT Evaluation $OT Eval Low Complexity: 1 Low OT Treatments $Self Care/Home Management : 8-22 mins  Gerrianne Scale, MS, OTR/L ascom [phone] or [phone] 06/26/19, 1:24 PM

## 2019-06-27 ENCOUNTER — Telehealth: Payer: Self-pay

## 2019-06-27 NOTE — Telephone Encounter (Signed)
Agree with taking zofran for nausea and following blood pressures.  Tylenol for headache.  Let us know if persistent.

## 2019-06-27 NOTE — Telephone Encounter (Signed)
Will follow daily.

## 2019-06-27 NOTE — Telephone Encounter (Signed)
Transition Care Management Follow-up Telephone Call   Date discharged? 06/26/19   How have you been since you were released from the hospital? Patient states, " I am resting well but I have a headache that doesn't seem to want to go away. They gave me tylenol for it in the hospital too, but it is still hanging on, it may be because of my sinus." Appetite is good but water is making me feel nauseated. Plans to take zofran as needed. Denies diarrhea. Spot checking blood pressure/HR. Lower extremities are feeling stronger.    Do you understand why you were in the hospital? Yes.   Do you understand the discharge instructions? Yes, increase activity as tolerated,    Where were you discharged to? Home.   Items Reviewed:  Medications reviewed: Taking all scheduled medications as directed.   Allergies reviewed: None new.  Dietary changes reviewed: Yes, heart healthy  Referrals reviewed: Yes.   Functional Questionnaire:   Activities of Daily Living (ADLs):   She states they are independent in the following: All ADLs States they require assistance with the following: Cane in use when ambulating as needed.    Any transportation issues/concerns?: None at this time.    Any patient concerns? Questions if she should be taking aspirin 325mg .    Confirmed importance and date/time of follow-up visits scheduled Yes, scheduled 06/29/19 @ 10:00.   Provider Appointment booked with Dr. Nicki Reaper, pcp.  Confirmed with patient if condition begins to worsen call PCP or go to the ER.  Patient was given the office number and encouraged to call back with question or concerns.  : Yes.

## 2019-06-28 NOTE — Telephone Encounter (Signed)
Patient notes today headache and nausea have subsided right now and she is feeling better.

## 2019-06-29 ENCOUNTER — Other Ambulatory Visit: Payer: Self-pay

## 2019-06-29 ENCOUNTER — Encounter: Payer: Self-pay | Admitting: Internal Medicine

## 2019-06-29 ENCOUNTER — Ambulatory Visit (INDEPENDENT_AMBULATORY_CARE_PROVIDER_SITE_OTHER): Payer: Medicare Other | Admitting: Internal Medicine

## 2019-06-29 VITALS — BP 118/78 | HR 68 | Temp 95.7°F | Resp 16 | Wt 230.0 lb

## 2019-06-29 DIAGNOSIS — R270 Ataxia, unspecified: Secondary | ICD-10-CM

## 2019-06-29 DIAGNOSIS — D649 Anemia, unspecified: Secondary | ICD-10-CM | POA: Diagnosis not present

## 2019-06-29 DIAGNOSIS — R29898 Other symptoms and signs involving the musculoskeletal system: Secondary | ICD-10-CM

## 2019-06-29 DIAGNOSIS — I10 Essential (primary) hypertension: Secondary | ICD-10-CM | POA: Diagnosis not present

## 2019-06-29 DIAGNOSIS — G4733 Obstructive sleep apnea (adult) (pediatric): Secondary | ICD-10-CM

## 2019-06-29 DIAGNOSIS — R944 Abnormal results of kidney function studies: Secondary | ICD-10-CM

## 2019-06-29 DIAGNOSIS — E782 Mixed hyperlipidemia: Secondary | ICD-10-CM | POA: Diagnosis not present

## 2019-06-29 LAB — BASIC METABOLIC PANEL
BUN: 16 mg/dL (ref 6–23)
CO2: 28 mEq/L (ref 19–32)
Calcium: 9.6 mg/dL (ref 8.4–10.5)
Chloride: 102 mEq/L (ref 96–112)
Creatinine, Ser: 1.03 mg/dL (ref 0.40–1.20)
GFR: 53.1 mL/min — ABNORMAL LOW (ref >60.00)
Glucose, Bld: 116 mg/dL — ABNORMAL HIGH (ref 70–99)
Potassium: 4.3 mEq/L (ref 3.5–5.1)
Sodium: 139 mEq/L (ref 135–145)

## 2019-06-29 NOTE — Progress Notes (Signed)
Patient ID: Nicole Sims, female   DOB: 01/10/1951, 68 y.o.   MRN: [id number]   Subjective:    Patient ID: Nicole Sims, female    DOB: 09-01-1950, 68 y.o.   MRN: [id number]  HPI  Patient here for hospital follow up.  She was admitted 06/25/19 - 06/27/19 with ataxia and lower extremity weakness.  Prior to coming in, she tried to walk and felt unsteady - gait.  Has a history of lumbar radiculitis.  Sees Dr Sharlet Salina.  Had epidural injection 06/22/19.  Worked in her yard doing a lot of physical activity.  Afterwards, noticed the weakness.  During her hospitalization, CT head negatie.  MRI/MRA brain - negative for stroke.  ECHO - EF 60-70%.  Ultrasound - no significant carotid stenosis.  Neurology consulted.  Recommended aspirin 325mg  q day.  Physical therapy and OT recommended rehab.  She declined and declined home health physical therapy.  Since her discharge home, she does feel better.  Ambulating better.  No chest pain.  Breathing stable.  Discussed therapy.  She continues to decline.     Past Medical History:  Diagnosis Date   Anemia    Anxiety    Asthma    Cataract cortical, senile    CHF (congestive heart failure) (HCC)    Chronic headaches    Diastolic heart failure (HCC)    Diverticulosis    Environmental allergies    GERD (gastroesophageal reflux disease)    Heart murmur    Hyperglycemia    Hyperlipidemia    Hypertension    Obesity    Osteoarthritis    Palpitations    Restless leg syndrome    Sleep apnea    Thrombocytosis ([place])    Urinary incontinence    mixed   Venous insufficiency    Vitamin D deficiency    Past Surgical History:  Procedure Laterality Date   BLADDER SURGERY     x2   [facility]   BREAST CYST ASPIRATION Bilateral 2005   approximate year   CARDIAC CATHETERIZATION     [place]   CERVICAL CONE BIOPSY     CIS   CHOLECYSTECTOMY     COLONOSCOPY WITH PROPOFOL N/A 07/19/2016   Procedure: COLONOSCOPY WITH PROPOFOL;   Surgeon: Manya Silvas, MD;  Location: [place];  Service: Endoscopy;  Laterality: N/A;   COLONOSCOPY WITH PROPOFOL N/A 10/20/2018   Procedure: COLONOSCOPY WITH PROPOFOL;  Surgeon: Lollie Sails, MD;  Location: [facility] ENDOSCOPY;  Service: Endoscopy;  Laterality: N/A;   ESOPHAGOGASTRODUODENOSCOPY (EGD) WITH PROPOFOL N/A 07/19/2016   Procedure: ESOPHAGOGASTRODUODENOSCOPY (EGD) WITH PROPOFOL;  Surgeon: Manya Silvas, MD;  Location: [facility] ENDOSCOPY;  Service: Endoscopy;  Laterality: N/A;   FLEXIBLE SIGMOIDOSCOPY     KNEE ARTHROSCOPY  08/13/08   knee replacement and revision     left   RECTOCELE REPAIR     RIGHT/LEFT HEART CATH AND CORONARY ANGIOGRAPHY Bilateral 04/10/2018   Procedure: RIGHT/LEFT HEART CATH AND CORONARY ANGIOGRAPHY;  Surgeon: Wellington Hampshire, MD;  Location: [facility];  Service: Cardiovascular;  Laterality: Bilateral;   ROTATOR CUFF REPAIR     bilateral   SHOULDER SURGERY  11/17/05   TONSILLECTOMY  1962   VAGINAL HYSTERECTOMY  1974   abnormal pap and carcinoma in situ   Family History  Problem Relation Age of Onset   Diabetes Mellitus II Father    Thyroid disease Father    Breast cancer Maternal Aunt    Hypertension Mother    Heart  Problems Brother    Leukemia Maternal Aunt    Colon cancer Neg Hx    Kidney cancer Neg Hx    Bladder Cancer Neg Hx    Social History   Socioeconomic History   Marital status: Married    Spouse name: Not on file   Number of children: Not on file   Years of education: Not on file   Highest education level: Not on file  Occupational History   Occupation: retired  Scientist, product/process development strain: Not on file   Food insecurity    Worry: Not on file    Inability: Not on file   Transportation needs    Medical: Not on file    Non-medical: Not on file  Tobacco Use   Smoking status: Former Smoker    Quit date: 08/17/1983    Years since quitting: 35.8   Smokeless tobacco: Never  Used  Substance and Sexual Activity   Alcohol use: Yes    Alcohol/week: 0.0 standard drinks    Comment: rarely   Drug use: No   Sexual activity: Not on file  Lifestyle   Physical activity    Days per week: Not on file    Minutes per session: Not on file   Stress: Not on file  Relationships   Social connections    Talks on phone: Not on file    Gets together: Not on file    Attends religious service: Not on file    Active member of club or organization: Not on file    Attends meetings of clubs or organizations: Not on file    Relationship status: Not on file  Other Topics Concern   Not on file  Social History Narrative   Lives at home with husband    Outpatient Encounter Medications as of 06/29/2019  Medication Sig   aspirin EC 81 MG tablet Take 81 mg by mouth daily.   Calcium Carb-Cholecalciferol (CALCIUM 500 +D) 500-400 MG-UNIT TABS Take 1 tablet by mouth daily.   Cholecalciferol (VITAMIN D-3) 1000 units CAPS Take 1,000 Units by mouth daily.    Chromium 1 MG CAPS Take 1 mg by mouth daily.   furosemide (LASIX) 20 MG tablet Take 1 tablet (20 mg total) by mouth daily.   gabapentin (NEURONTIN) 300 MG capsule Take 1 capsule (300 mg total) by mouth at bedtime as needed. (Patient taking differently: Take 300-600 mg by mouth at bedtime as needed. )   linaclotide (LINZESS) 290 MCG CAPS capsule Take 290 mcg by mouth daily as needed (GI symptoms).    magnesium oxide (MAG-OX) 400 MG tablet Take 400 mg daily by mouth.   metoprolol succinate (TOPROL-XL) 100 MG 24 hr tablet Take 1 tablet (100 mg total) by mouth daily. Take with or immediately following a meal.   montelukast (SINGULAIR) 10 MG tablet TAKE 1 TABLET BY MOUTH AT  BEDTIME (Patient taking differently: Take 10 mg by mouth at bedtime. )   Multiple Vitamin (MULTIVITAMIN) tablet Take 1 tablet by mouth daily.   naproxen sodium (ALEVE) 220 MG tablet Take 220-440 mg by mouth daily as needed (pain).   ondansetron  (ZOFRAN) 4 MG tablet Take 1 tablet (4 mg total) by mouth 2 (two) times daily as needed for nausea or vomiting.   potassium chloride (K-DUR) 10 MEQ tablet Take 1 tablet (10 mEq total) by mouth daily.   RABEprazole (ACIPHEX) 20 MG tablet Take 20 mg by mouth 2 (two) times daily.   rosuvastatin (CRESTOR) 10  MG tablet TAKE 1 TABLET BY MOUTH  DAILY (Patient taking differently: Take 5 mg by mouth See admin instructions. Take  tablet (5mg ) by mouth every Monday, Tuesday, Wednesday, Thursday and Friday night)   sucralfate (CARAFATE) 1 g tablet Take 1 g by mouth daily as needed (GI sympotms).    venlafaxine XR (EFFEXOR-XR) 150 MG 24 hr capsule TAKE 1 CAPSULE BY MOUTH  DAILY WITH BREAKFAST (Patient taking differently: Take 150 mg by mouth daily with breakfast. )   vitamin B-12 (CYANOCOBALAMIN) 1000 MCG tablet Take 1,000 mcg by mouth daily.   No facility-administered encounter medications on file as of 06/29/2019.    Review of Systems  Constitutional: Negative for appetite change and unexpected weight change.  HENT: Negative for congestion and sinus pressure.   Respiratory: Negative for cough, chest tightness and shortness of breath.   Cardiovascular: Negative for chest pain, palpitations and leg swelling.  Gastrointestinal: Negative for abdominal pain, diarrhea, nausea and vomiting.  Genitourinary: Negative for difficulty urinating and dysuria.  Musculoskeletal: Negative for joint swelling and myalgias.  Skin: Negative for color change and rash.  Neurological: Negative for dizziness and headaches.  Psychiatric/Behavioral: Negative for agitation and dysphoric mood.       Objective:    Physical Exam Constitutional:      General: She is not in acute distress.    Appearance: Normal appearance.  HENT:     Head: Normocephalic and atraumatic.     Right Ear: External ear normal.     Left Ear: External ear normal.  Eyes:     General: No scleral icterus.       Right eye: No discharge.         Left eye: No discharge.     Conjunctiva/sclera: Conjunctivae normal.  Neck:     Musculoskeletal: Neck supple. No muscular tenderness.     Thyroid: No thyromegaly.  Cardiovascular:     Rate and Rhythm: Normal rate and regular rhythm.  Pulmonary:     Effort: No respiratory distress.     Breath sounds: Normal breath sounds. No wheezing.  Abdominal:     General: Bowel sounds are normal.     Palpations: Abdomen is soft.     Tenderness: There is no abdominal tenderness.  Musculoskeletal:        General: No swelling or tenderness.     Comments: Motor strength appears to be equal bilateral lower extremities.    Lymphadenopathy:     Cervical: No cervical adenopathy.  Skin:    Findings: No erythema or rash.  Neurological:     Mental Status: She is alert.     Comments: Able to ambulate without significant difficulty.   Psychiatric:        Mood and Affect: Mood normal.        Behavior: Behavior normal.     BP 118/78    Pulse 68    Temp (!) 95.7 F (35.4 C)    Resp 16    Wt 230 lb (104.3 kg)    SpO2 98%    BMI 38.27 kg/m  Wt Readings from Last 3 Encounters:  06/29/19 230 lb (104.3 kg)  06/25/19 230 lb 8 oz (104.6 kg)  06/21/19 236 lb (107 kg)     Lab Results  Component Value Date   WBC 9.5 06/25/2019   HGB 14.2 06/25/2019   HCT 43.7 06/25/2019   PLT 481 (H) 06/25/2019   GLUCOSE 116 (H) 06/29/2019   CHOL 191 06/26/2019   TRIG 152 (H)  06/26/2019   HDL 56 06/26/2019   LDLDIRECT 172.8 09/17/2013   LDLCALC 105 (H) 06/26/2019   ALT 19 06/25/2019   AST 22 06/25/2019   NA 139 06/29/2019   K 4.3 06/29/2019   CL 102 06/29/2019   CREATININE 1.03 06/29/2019   BUN 16 06/29/2019   CO2 28 06/29/2019   TSH 2.05 06/21/2019   INR 1.0 06/11/2019   HGBA1C 6.0 (H) 06/26/2019   MICROALBUR 1.1 04/28/2015    Ct Head Wo Contrast  Result Date: 06/25/2019 CLINICAL DATA:  Focal neuro deficit. EXAM: CT HEAD WITHOUT CONTRAST TECHNIQUE: Contiguous axial images were obtained from the base of  the skull through the vertex without intravenous contrast. COMPARISON:  MRI head 05/26/2015 FINDINGS: Brain: No evidence of acute infarction, hemorrhage, hydrocephalus, extra-axial collection or mass lesion/mass effect. Vascular: Negative for hyperdense vessel Skull: Negative Sinuses/Orbits: Negative Other: None IMPRESSION: Negative CT head Electronically Signed   By: Franchot Gallo M.D.   On: 06/25/2019 10:21   Mr Angio Head Wo Contrast  Result Date: 06/25/2019 CLINICAL DATA:  Ataxia, stroke suspected. Cerebral aneurysm, subarachnoid hemorrhage, cerebral vasospasm evaluation. Additional history provided: Patient presents to ED for weakness beginning yesterday with difficulty walking. EXAM: MRI HEAD WITHOUT CONTRAST MRA HEAD WITHOUT CONTRAST TECHNIQUE: Multiplanar, multiecho pulse sequences of the brain and surrounding structures were obtained without intravenous contrast. Angiographic images of the head were obtained using MRA technique without contrast. COMPARISON:  Head CT 06/25/2019, brain MRI 05/26/2015 FINDINGS: MRI HEAD FINDINGS Brain: There is no convincing evidence of acute infarct. No evidence of intracranial mass. No midline shift or extra-axial fluid collection. No chronic intracranial blood products. Mild scattered T2/FLAIR hyperintensity within the cerebral white matter is nonspecific, but consistent with chronic small vessel ischemic disease. Cerebral volume is normal for age. Vascular: Reported separately Skull and upper cervical spine: No focal marrow lesion Sinuses/Orbits: Visualized orbits demonstrate no acute abnormality. Trace ethmoid sinus mucosal thickening. No significant mastoid effusion. MRA HEAD FINDINGS The intracranial internal carotid arteries are patent with mild atherosclerotic irregularity. The bilateral middle and anterior cerebral arteries are patent without significant proximal stenosis. No intracranial aneurysm is identified. The intracranial vertebral arteries are patent.  Apparent moderate focal stenosis within the intracranial right vertebral artery. The basilar artery is patent without significant stenosis. The right posterior cerebral artery is patent without significant proximal stenosis. A right posterior communicating artery is present. Predominantly fetal origin of the left posterior cerebral artery, which is patent without significant proximal stenosis. IMPRESSION: MRI brain: 1. No evidence of acute intracranial abnormality. 2. Mild chronic small vessel ischemic disease. MRA head: 1. No intracranial large vessel occlusion. 2. Moderate focal stenosis within the intracranial right vertebral artery. 3. Mild atherosclerotic irregularity of the intracranial internal carotid arteries. 4. No intracranial aneurysm identified. Electronically Signed   By: Kellie Simmering DO   On: 06/25/2019 13:11   Mr Brain Wo Contrast  Result Date: 06/25/2019 CLINICAL DATA:  Ataxia, stroke suspected. Cerebral aneurysm, subarachnoid hemorrhage, cerebral vasospasm evaluation. Additional history provided: Patient presents to ED for weakness beginning yesterday with difficulty walking. EXAM: MRI HEAD WITHOUT CONTRAST MRA HEAD WITHOUT CONTRAST TECHNIQUE: Multiplanar, multiecho pulse sequences of the brain and surrounding structures were obtained without intravenous contrast. Angiographic images of the head were obtained using MRA technique without contrast. COMPARISON:  Head CT 06/25/2019, brain MRI 05/26/2015 FINDINGS: MRI HEAD FINDINGS Brain: There is no convincing evidence of acute infarct. No evidence of intracranial mass. No midline shift or extra-axial fluid collection. No chronic intracranial blood  products. Mild scattered T2/FLAIR hyperintensity within the cerebral white matter is nonspecific, but consistent with chronic small vessel ischemic disease. Cerebral volume is normal for age. Vascular: Reported separately Skull and upper cervical spine: No focal marrow lesion Sinuses/Orbits: Visualized  orbits demonstrate no acute abnormality. Trace ethmoid sinus mucosal thickening. No significant mastoid effusion. MRA HEAD FINDINGS The intracranial internal carotid arteries are patent with mild atherosclerotic irregularity. The bilateral middle and anterior cerebral arteries are patent without significant proximal stenosis. No intracranial aneurysm is identified. The intracranial vertebral arteries are patent. Apparent moderate focal stenosis within the intracranial right vertebral artery. The basilar artery is patent without significant stenosis. The right posterior cerebral artery is patent without significant proximal stenosis. A right posterior communicating artery is present. Predominantly fetal origin of the left posterior cerebral artery, which is patent without significant proximal stenosis. IMPRESSION: MRI brain: 1. No evidence of acute intracranial abnormality. 2. Mild chronic small vessel ischemic disease. MRA head: 1. No intracranial large vessel occlusion. 2. Moderate focal stenosis within the intracranial right vertebral artery. 3. Mild atherosclerotic irregularity of the intracranial internal carotid arteries. 4. No intracranial aneurysm identified. Electronically Signed   By: Kellie Simmering DO   On: 06/25/2019 13:11   US Carotid Bilateral (at [facility] Only)  Result Date: 06/26/2019 CLINICAL DATA:  Lower extremity weakness, ataxia, hypertension EXAM: BILATERAL CAROTID DUPLEX ULTRASOUND TECHNIQUE: Pearline Cables scale imaging, color Doppler and duplex ultrasound were performed of bilateral carotid and vertebral arteries in the neck. COMPARISON:  None. FINDINGS: Criteria: Quantification of carotid stenosis is based on velocity parameters that correlate the residual internal carotid diameter with NASCET-based stenosis levels, using the diameter of the distal internal carotid lumen as the denominator for stenosis measurement. The following velocity measurements were obtained: RIGHT ICA: 64/20 cm/sec CCA:  [id number] cm/sec SYSTOLIC ICA/CCA RATIO:  0.9 ECA: 65 cm/sec LEFT ICA: 67/20 cm/sec CCA: [id number] cm/sec SYSTOLIC ICA/CCA RATIO:  1.0 ECA: 97 cm/sec RIGHT CAROTID ARTERY: Minor echogenic shadowing plaque formation. No hemodynamically significant right ICA stenosis, velocity elevation, or turbulent flow. Degree of narrowing less than 50%. RIGHT VERTEBRAL ARTERY:  Antegrade LEFT CAROTID ARTERY: Similar scattered minor echogenic plaque formation. No hemodynamically significant left ICA stenosis, velocity elevation, or turbulent flow. LEFT VERTEBRAL ARTERY:  Antegrade IMPRESSION: Minor carotid atherosclerosis. No hemodynamically significant ICA stenosis. Degree of narrowing less than 50% bilaterally by ultrasound criteria. Patent antegrade vertebral flow bilaterally Electronically Signed   By: Jerilynn Mages.  Shick M.D.   On: 06/26/2019 08:48       Assessment & Plan:   Problem List Items Addressed This Visit    Anemia    Has been evaluated by hematology.  Following.       Ataxia    Recently admitted with leg weakness and unsteady gait.  W/up unrevealing as outlined.  Neurology evaluated pt in the hospital.  Recommended 325mg  aspirin.  Since discharge - doing better.  No focal weakness noted on exam today.  Have her f/u with neurology.        Combined hyperlipidemia    On crestor.  Low cholesterol diet and exercise.  Follow lipid panel and liver function tests.        Essential hypertension    Blood pressure under good control.  Continue same medication regimen.  Follow pressures.  Follow metabolic panel.        Lower extremity weakness    W/up unrevealing as outlined.  No focal deficits noted on today's exam.  F/u with neurology as outpatient to  confirm no further w/up warranted.        Relevant Orders   Ambulatory referral to Neurology   OSA (obstructive sleep apnea)    CPAP.        Other Visit Diagnoses    Decreased GFR    -  Primary   Relevant Orders   Basic metabolic panel (today) (Completed)        Einar Pheasant, MD

## 2019-07-01 ENCOUNTER — Encounter: Payer: Self-pay | Admitting: Internal Medicine

## 2019-07-01 NOTE — Assessment & Plan Note (Signed)
CPAP.  

## 2019-07-01 NOTE — Assessment & Plan Note (Signed)
Blood pressure under good control.  Continue same medication regimen.  Follow pressures.  Follow metabolic panel.   

## 2019-07-01 NOTE — Assessment & Plan Note (Signed)
W/up unrevealing as outlined.  No focal deficits noted on today's exam.  F/u with neurology as outpatient to confirm no further w/up warranted.

## 2019-07-01 NOTE — Assessment & Plan Note (Signed)
Recently admitted with leg weakness and unsteady gait.  W/up unrevealing as outlined.  Neurology evaluated pt in the hospital.  Recommended 325mg  aspirin.  Since discharge - doing better.  No focal weakness noted on exam today.  Have her f/u with neurology.

## 2019-07-01 NOTE — Assessment & Plan Note (Signed)
On crestor.  Low cholesterol diet and exercise.  Follow lipid panel and liver function tests.   

## 2019-07-01 NOTE — Assessment & Plan Note (Signed)
Has been evaluated by hematology.  Following.

## 2019-07-06 ENCOUNTER — Ambulatory Visit: Payer: Medicare Other | Admitting: Internal Medicine

## 2019-08-03 DIAGNOSIS — M8949 Other hypertrophic osteoarthropathy, multiple sites: Secondary | ICD-10-CM | POA: Diagnosis not present

## 2019-08-03 DIAGNOSIS — M150 Primary generalized (osteo)arthritis: Secondary | ICD-10-CM | POA: Insufficient documentation

## 2019-08-03 DIAGNOSIS — E768 Other disorders of glucosaminoglycan metabolism: Secondary | ICD-10-CM | POA: Diagnosis not present

## 2019-08-03 DIAGNOSIS — R768 Other specified abnormal immunological findings in serum: Secondary | ICD-10-CM | POA: Insufficient documentation

## 2019-08-03 DIAGNOSIS — D473 Essential (hemorrhagic) thrombocythemia: Secondary | ICD-10-CM | POA: Diagnosis not present

## 2019-08-03 DIAGNOSIS — M791 Myalgia, unspecified site: Secondary | ICD-10-CM | POA: Diagnosis not present

## 2019-08-03 DIAGNOSIS — M159 Polyosteoarthritis, unspecified: Secondary | ICD-10-CM | POA: Insufficient documentation

## 2019-08-03 DIAGNOSIS — R769 Abnormal immunological finding in serum, unspecified: Secondary | ICD-10-CM | POA: Diagnosis not present

## 2019-08-07 DIAGNOSIS — Z79899 Other long term (current) drug therapy: Secondary | ICD-10-CM | POA: Diagnosis not present

## 2019-08-07 DIAGNOSIS — R29898 Other symptoms and signs involving the musculoskeletal system: Secondary | ICD-10-CM | POA: Diagnosis not present

## 2019-08-07 DIAGNOSIS — R202 Paresthesia of skin: Secondary | ICD-10-CM | POA: Diagnosis not present

## 2019-08-07 DIAGNOSIS — E559 Vitamin D deficiency, unspecified: Secondary | ICD-10-CM | POA: Diagnosis not present

## 2019-08-07 DIAGNOSIS — I679 Cerebrovascular disease, unspecified: Secondary | ICD-10-CM | POA: Diagnosis not present

## 2019-08-14 ENCOUNTER — Other Ambulatory Visit: Payer: Self-pay | Admitting: Internal Medicine

## 2019-08-15 ENCOUNTER — Other Ambulatory Visit: Payer: Self-pay | Admitting: Neurology

## 2019-08-15 DIAGNOSIS — R29898 Other symptoms and signs involving the musculoskeletal system: Secondary | ICD-10-CM

## 2019-08-21 ENCOUNTER — Ambulatory Visit (INDEPENDENT_AMBULATORY_CARE_PROVIDER_SITE_OTHER): Payer: Medicare Other | Admitting: Internal Medicine

## 2019-08-21 ENCOUNTER — Encounter: Payer: Self-pay | Admitting: Internal Medicine

## 2019-08-21 ENCOUNTER — Other Ambulatory Visit: Payer: Self-pay

## 2019-08-21 DIAGNOSIS — D649 Anemia, unspecified: Secondary | ICD-10-CM

## 2019-08-21 DIAGNOSIS — G4733 Obstructive sleep apnea (adult) (pediatric): Secondary | ICD-10-CM

## 2019-08-21 DIAGNOSIS — K219 Gastro-esophageal reflux disease without esophagitis: Secondary | ICD-10-CM | POA: Diagnosis not present

## 2019-08-21 DIAGNOSIS — I10 Essential (primary) hypertension: Secondary | ICD-10-CM | POA: Diagnosis not present

## 2019-08-21 DIAGNOSIS — R29898 Other symptoms and signs involving the musculoskeletal system: Secondary | ICD-10-CM

## 2019-08-21 DIAGNOSIS — R739 Hyperglycemia, unspecified: Secondary | ICD-10-CM

## 2019-08-21 DIAGNOSIS — I5030 Unspecified diastolic (congestive) heart failure: Secondary | ICD-10-CM

## 2019-08-21 DIAGNOSIS — D473 Essential (hemorrhagic) thrombocythemia: Secondary | ICD-10-CM

## 2019-08-21 DIAGNOSIS — D75839 Thrombocytosis, unspecified: Secondary | ICD-10-CM

## 2019-08-21 DIAGNOSIS — E782 Mixed hyperlipidemia: Secondary | ICD-10-CM

## 2019-08-21 NOTE — Progress Notes (Signed)
Patient ID: Nicole Sims, female   DOB: 1950/09/23, 69 y.o.   MRN: [id number]   Virtual Visit via video Note  This visit type was conducted due to national recommendations for restrictions regarding the COVID-19 pandemic (e.g. social distancing).  This format is felt to be most appropriate for this patient at this time.  All issues noted in this document were discussed and addressed.  No physical exam was performed (except for noted visual exam findings with Video Visits).   I connected with Nicole Sims by a video enabled telemedicine application and verified that I am speaking with the correct person using two identifiers. Location patient: home Location provider: work  Persons participating in the virtual visit: patient, provider  The limitations, risks, security and privacy concerns of performing an evaluation and management service by video and the availability of in person appointments have been discussed.  The patient expressed understanding and agreed to proceed.  Interactive audio and video telecommunications were attempted between this provider and patient and were successful initially.  Due to some technical issues, we had to convert the visit to a telephone visit.  We continued and completed visit with audio only. She agreed.    Reason for visit: scheduled follow up.    HPI: Admitted 06/25/19 - 06/27/19 with ataxia and lower extremity weakness.  See last note.  Has a history of lumbar radiculitis.  Has seen Dr Sharlet Salina and s/p EI.  Saw rheumatology 08/03/19.  Labs and w/up in progress.  Saw neurology 08/07/19.  Evaluated for paraesthesia of bilateral lower extremity and weakness. Scheduled for MRI tomorrow lumbar spine tomorrow.  Ordered NCS.  Gabapentin dose adjusted.  Also reviewed w/up and found to have intracranial stenosis of right vertebral artery and moderante white matter microvascular ischemic and metabolic changes.  Recommended continuing aspirin and crestor.  Reports things are  fairly stable.  No chest pain reported. Breathing stable.  Eating.  Off lasix and potassium.  No abdominal pain reported.     ROS: See pertinent positives and negatives per HPI.  Past Medical History:  Diagnosis Date  . Anemia   . Anxiety   . Asthma   . Cataract cortical, senile   . CHF (congestive heart failure) ([place])   . Chronic headaches   . Diastolic heart failure ([place])   . Diverticulosis   . Environmental allergies   . GERD (gastroesophageal reflux disease)   . Heart murmur   . Hyperglycemia   . Hyperlipidemia   . Hypertension   . Obesity   . Osteoarthritis   . Palpitations   . Restless leg syndrome   . Sleep apnea   . Thrombocytosis ([place])   . Urinary incontinence    mixed  . Venous insufficiency   . Vitamin D deficiency     Past Surgical History:  Procedure Laterality Date  . BLADDER SURGERY     x2   [facility]  . BREAST CYST ASPIRATION Bilateral 2005   approximate year  . CARDIAC CATHETERIZATION     Nehemiah Massed  . CERVICAL CONE BIOPSY     CIS  . CHOLECYSTECTOMY    . COLONOSCOPY WITH PROPOFOL N/A 07/19/2016   Procedure: COLONOSCOPY WITH PROPOFOL;  Surgeon: Manya Silvas, MD;  Location: [facility] ENDOSCOPY;  Service: Endoscopy;  Laterality: N/A;  . COLONOSCOPY WITH PROPOFOL N/A 10/20/2018   Procedure: COLONOSCOPY WITH PROPOFOL;  Surgeon: Lollie Sails, MD;  Location: [facility] ENDOSCOPY;  Service: Endoscopy;  Laterality: N/A;  . ESOPHAGOGASTRODUODENOSCOPY (EGD) WITH PROPOFOL N/A  07/19/2016   Procedure: ESOPHAGOGASTRODUODENOSCOPY (EGD) WITH PROPOFOL;  Surgeon: Manya Silvas, MD;  Location: [facility] ENDOSCOPY;  Service: Endoscopy;  Laterality: N/A;  . FLEXIBLE SIGMOIDOSCOPY    . KNEE ARTHROSCOPY  08/13/08  . knee replacement and revision     left  . RECTOCELE REPAIR    . RIGHT/LEFT HEART CATH AND CORONARY ANGIOGRAPHY Bilateral 04/10/2018   Procedure: RIGHT/LEFT HEART CATH AND CORONARY ANGIOGRAPHY;  Surgeon: Wellington Hampshire, MD;  Location: [place] CV  LAB;  Service: Cardiovascular;  Laterality: Bilateral;  . ROTATOR CUFF REPAIR     bilateral  . SHOULDER SURGERY  11/17/05  . TONSILLECTOMY  1962  . VAGINAL HYSTERECTOMY  1974   abnormal pap and carcinoma in situ    Family History  Problem Relation Age of Onset  . Diabetes Mellitus II Father   . Thyroid disease Father   . Breast cancer Maternal Aunt   . Hypertension Mother   . Heart Problems Brother   . Leukemia Maternal Aunt   . Colon cancer Neg Hx   . Kidney cancer Neg Hx   . Bladder Cancer Neg Hx     SOCIAL HX: reviewed.    Current Outpatient Medications:  .  aspirin EC 81 MG tablet, Take 81 mg by mouth daily., Disp: , Rfl:  .  Calcium Carb-Cholecalciferol (CALCIUM 500 +D) 500-400 MG-UNIT TABS, Take 1 tablet by mouth daily., Disp: , Rfl:  .  Cholecalciferol (VITAMIN D-3) 1000 units CAPS, Take 1,000 Units by mouth daily. , Disp: , Rfl:  .  Chromium 1 MG CAPS, Take 1 mg by mouth daily., Disp: , Rfl:  .  furosemide (LASIX) 20 MG tablet, Take 1 tablet (20 mg total) by mouth daily., Disp: 90 tablet, Rfl: 3 .  gabapentin (NEURONTIN) 300 MG capsule, Take 1 capsule (300 mg total) by mouth at bedtime as needed. (Patient taking differently: Take 300-600 mg by mouth at bedtime as needed. ), Disp: 30 capsule, Rfl: 1 .  linaclotide (LINZESS) 290 MCG CAPS capsule, Take 290 mcg by mouth daily as needed (GI symptoms). , Disp: , Rfl:  .  magnesium oxide (MAG-OX) 400 MG tablet, Take 400 mg daily by mouth., Disp: , Rfl:  .  metoprolol succinate (TOPROL-XL) 100 MG 24 hr tablet, Take 1 tablet (100 mg total) by mouth daily. Take with or immediately following a meal., Disp: 90 tablet, Rfl: 3 .  montelukast (SINGULAIR) 10 MG tablet, TAKE 1 TABLET BY MOUTH AT  BEDTIME (Patient taking differently: Take 10 mg by mouth at bedtime. ), Disp: 90 tablet, Rfl: 3 .  Multiple Vitamin (MULTIVITAMIN) tablet, Take 1 tablet by mouth daily., Disp: , Rfl:  .  naproxen sodium (ALEVE) 220 MG tablet, Take 220-440 mg by  mouth daily as needed (pain)., Disp: , Rfl:  .  ondansetron (ZOFRAN) 4 MG tablet, Take 1 tablet (4 mg total) by mouth 2 (two) times daily as needed for nausea or vomiting., Disp: 15 tablet, Rfl: 0 .  potassium chloride (K-DUR) 10 MEQ tablet, Take 1 tablet (10 mEq total) by mouth daily., Disp: 90 tablet, Rfl: 3 .  RABEprazole (ACIPHEX) 20 MG tablet, Take 20 mg by mouth 2 (two) times daily., Disp: , Rfl:  .  rosuvastatin (CRESTOR) 10 MG tablet, TAKE 1 TABLET BY MOUTH  DAILY, Disp: 90 tablet, Rfl: 3 .  sucralfate (CARAFATE) 1 g tablet, Take 1 g by mouth daily as needed (GI sympotms). , Disp: , Rfl:  .  venlafaxine XR (EFFEXOR-XR) 150 MG 24  hr capsule, TAKE 1 CAPSULE BY MOUTH  DAILY WITH BREAKFAST (Patient taking differently: Take 150 mg by mouth daily with breakfast. ), Disp: 90 capsule, Rfl: 3 .  vitamin B-12 (CYANOCOBALAMIN) 1000 MCG tablet, Take 1,000 mcg by mouth daily., Disp: , Rfl:   EXAM:  GENERAL: alert, oriented, appears well and in no acute distress  HEENT: atraumatic, conjunttiva clear, no obvious abnormalities on inspection of external nose and ears  NECK: normal movements of the head and neck  LUNGS: on inspection no signs of respiratory distress, breathing rate appears normal, no obvious gross SOB, gasping or wheezing  CV: no obvious cyanosis  PSYCH/NEURO: pleasant and cooperative, no obvious depression or anxiety, speech and thought processing grossly intact  ASSESSMENT AND PLAN:  Discussed the following assessment and plan:  Anemia Has been evaluated by hematology.  Following.   Combined hyperlipidemia On crestor.  Low cholesterol diet and exercise.  Follow lipid panel and liver function tests.    Diastolic heart failure (HCC) Does not appear or sound to be volume overloaded.  Off lasix.  Stable.  Follow.    Essential hypertension Blood pressure under good control.  Continue same medication regimen.  Follow pressures.  Follow metabolic panel.    Gastroesophageal  reflux disease Upper symptoms controlled.  Follow.    Hyperglycemia Low carb diet and exercise.  Follow met b and a1c.   Lower extremity weakness Being evaluated by rheumatology and neurology.  Planning for MRI lumbar spine tomorrow. Planning for NCS.  Stable.    OSA (obstructive sleep apnea) CPAP.   Thrombocytosis Worked up by hematology.  Determined to be reactive.  Follow cbc.    No orders of the defined types were placed in this encounter.   No orders of the defined types were placed in this encounter.    I discussed the assessment and treatment plan with the patient. The patient was provided an opportunity to ask questions and all were answered. The patient agreed with the plan and demonstrated an understanding of the instructions.   The patient was advised to call back or seek an in-person evaluation if the symptoms worsen or if the condition fails to improve as anticipated.  I provided 22 minutes of non-face-to-face time during this encounter.   Einar Pheasant, MD

## 2019-08-22 ENCOUNTER — Other Ambulatory Visit: Payer: Self-pay

## 2019-08-22 ENCOUNTER — Ambulatory Visit
Admission: RE | Admit: 2019-08-22 | Discharge: 2019-08-22 | Disposition: A | Discharge status: Home / Self Care | Payer: Medicare Other | Source: Ambulatory Visit | Attending: Neurology | Admitting: Neurology

## 2019-08-22 DIAGNOSIS — R29898 Other symptoms and signs involving the musculoskeletal system: Secondary | ICD-10-CM | POA: Diagnosis not present

## 2019-08-22 DIAGNOSIS — M48061 Spinal stenosis, lumbar region without neurogenic claudication: Secondary | ICD-10-CM | POA: Diagnosis not present

## 2019-08-26 NOTE — Assessment & Plan Note (Signed)
CPAP.  

## 2019-08-26 NOTE — Assessment & Plan Note (Signed)
Blood pressure under good control.  Continue same medication regimen.  Follow pressures.  Follow metabolic panel.   

## 2019-08-26 NOTE — Assessment & Plan Note (Signed)
Worked up by hematology.  Determined to be reactive.  Follow cbc.

## 2019-08-26 NOTE — Assessment & Plan Note (Signed)
Upper symptoms controlled.  Follow.  

## 2019-08-26 NOTE — Assessment & Plan Note (Signed)
On crestor.  Low cholesterol diet and exercise.  Follow lipid panel and liver function tests.   

## 2019-08-26 NOTE — Assessment & Plan Note (Signed)
Low carb diet and exercise.  Follow met b and a1c.  

## 2019-08-26 NOTE — Assessment & Plan Note (Signed)
Being evaluated by rheumatology and neurology.  Planning for MRI lumbar spine tomorrow. Planning for NCS.  Stable.

## 2019-08-26 NOTE — Assessment & Plan Note (Signed)
Has been evaluated by hematology.  Following.

## 2019-08-26 NOTE — Assessment & Plan Note (Signed)
Does not appear or sound to be volume overloaded.  Off lasix.  Stable.  Follow.

## 2019-10-01 ENCOUNTER — Ambulatory Visit (INDEPENDENT_AMBULATORY_CARE_PROVIDER_SITE_OTHER): Payer: Medicare Other | Admitting: Internal Medicine

## 2019-10-01 ENCOUNTER — Ambulatory Visit: Payer: Medicare Other | Attending: Internal Medicine

## 2019-10-01 ENCOUNTER — Other Ambulatory Visit: Payer: Self-pay

## 2019-10-01 DIAGNOSIS — D473 Essential (hemorrhagic) thrombocythemia: Secondary | ICD-10-CM

## 2019-10-01 DIAGNOSIS — E782 Mixed hyperlipidemia: Secondary | ICD-10-CM

## 2019-10-01 DIAGNOSIS — I5030 Unspecified diastolic (congestive) heart failure: Secondary | ICD-10-CM

## 2019-10-01 DIAGNOSIS — K219 Gastro-esophageal reflux disease without esophagitis: Secondary | ICD-10-CM | POA: Diagnosis not present

## 2019-10-01 DIAGNOSIS — I10 Essential (primary) hypertension: Secondary | ICD-10-CM | POA: Diagnosis not present

## 2019-10-01 DIAGNOSIS — G4733 Obstructive sleep apnea (adult) (pediatric): Secondary | ICD-10-CM

## 2019-10-01 DIAGNOSIS — F439 Reaction to severe stress, unspecified: Secondary | ICD-10-CM

## 2019-10-01 DIAGNOSIS — R0981 Nasal congestion: Secondary | ICD-10-CM

## 2019-10-01 DIAGNOSIS — Z20822 Contact with and (suspected) exposure to covid-19: Secondary | ICD-10-CM

## 2019-10-01 DIAGNOSIS — D75839 Thrombocytosis, unspecified: Secondary | ICD-10-CM

## 2019-10-01 DIAGNOSIS — D649 Anemia, unspecified: Secondary | ICD-10-CM

## 2019-10-01 DIAGNOSIS — R29898 Other symptoms and signs involving the musculoskeletal system: Secondary | ICD-10-CM

## 2019-10-01 DIAGNOSIS — R739 Hyperglycemia, unspecified: Secondary | ICD-10-CM

## 2019-10-01 MED ORDER — PREDNISONE 10 MG PO TABS: ORAL_TABLET | ORAL | 0 refills | Status: DC

## 2019-10-01 NOTE — Progress Notes (Signed)
Patient ID: Nicole Sims, female   DOB: 11-Sep-1950, 69 y.o.   MRN: [id number]   Virtual Visit via video Note  This visit type was conducted due to national recommendations for restrictions regarding the COVID-19 pandemic (e.g. social distancing).  This format is felt to be most appropriate for this patient at this time.  All issues noted in this document were discussed and addressed.  No physical exam was performed (except for noted visual exam findings with Video Visits).   I connected with Nicole Sims by a video enabled telemedicine application and verified that I am speaking with the correct person using two identifiers. Location patient: home Location provider: work  Persons participating in the virtual visit: patient, provider  The limitations, risks, security and privacy concerns of performing an evaluation and management service by telephone and the availability of in person appointments have been discussed. The patient expressed understanding and agreed to proceed.   Reason for visit:  Follow up appt  HPI: Admitted 06/25/19 - 06/27/19 with ataxia and lower extremity weakness.  See previous notes.  Seeing neurology for evaluation of paraesthesias of lower extremity and weakness.  Had MRI lumbar spine - L4-5 and L5-S1.  Recommended consultation with Dr Sharlet Salina.  Also being followed by rheumatology.  On gabapentin.  She has noticed increased post nasal drainage and headache.  Runny nose.  Scratchy throat.  Throat - mucus production.  Irritation - right ear.  Some chest heaviness.  No chest pain.  No increased sob.  Taking CF Tussin and dayquil.  Has felt some better today and yesterday.  09/25/19 - negative covid.  Pulse ox 97-98%.  She is eating ok.  No diarrhea - bowel change.     ROS: See pertinent positives and negatives per HPI.  Past Medical History:  Diagnosis Date  . Anemia   . Anxiety   . Asthma   . Cataract cortical, senile   . CHF (congestive heart failure) ([place])   .  Chronic headaches   . Diastolic heart failure ([place])   . Diverticulosis   . Environmental allergies   . GERD (gastroesophageal reflux disease)   . Heart murmur   . Hyperglycemia   . Hyperlipidemia   . Hypertension   . Obesity   . Osteoarthritis   . Palpitations   . Restless leg syndrome   . Sleep apnea   . Thrombocytosis ([place])   . Urinary incontinence    mixed  . Venous insufficiency   . Vitamin D deficiency     Past Surgical History:  Procedure Laterality Date  . BLADDER SURGERY     x2   [facility]  . BREAST CYST ASPIRATION Bilateral 2005   approximate year  . CARDIAC CATHETERIZATION     Nehemiah Massed  . CERVICAL CONE BIOPSY     CIS  . CHOLECYSTECTOMY    . COLONOSCOPY WITH PROPOFOL N/A 07/19/2016   Procedure: COLONOSCOPY WITH PROPOFOL;  Surgeon: Manya Silvas, MD;  Location: [facility] ENDOSCOPY;  Service: Endoscopy;  Laterality: N/A;  . COLONOSCOPY WITH PROPOFOL N/A 10/20/2018   Procedure: COLONOSCOPY WITH PROPOFOL;  Surgeon: Lollie Sails, MD;  Location: [facility] ENDOSCOPY;  Service: Endoscopy;  Laterality: N/A;  . ESOPHAGOGASTRODUODENOSCOPY (EGD) WITH PROPOFOL N/A 07/19/2016   Procedure: ESOPHAGOGASTRODUODENOSCOPY (EGD) WITH PROPOFOL;  Surgeon: Manya Silvas, MD;  Location[facility] ENDOSCOPY;  Service: Endoscopy;  Laterality: N/A;  . FLEXIBLE SIGMOIDOSCOPY    . KNEE ARTHROSCOPY  08/13/08  . knee replacement and revision  left  . RECTOCELE REPAIR    . RIGHT/LEFT HEART CATH AND CORONARY ANGIOGRAPHY Bilateral 04/10/2018   Procedure: RIGHT/LEFT HEART CATH AND CORONARY ANGIOGRAPHY;  Surgeon: Wellington Hampshire, MD;  Location: [place] CV LAB;  Service: Cardiovascular;  Laterality: Bilateral;  . ROTATOR CUFF REPAIR     bilateral  . SHOULDER SURGERY  11/17/05  . TONSILLECTOMY  1962  . VAGINAL HYSTERECTOMY  1974   abnormal pap and carcinoma in situ    Family History  Problem Relation Age of Onset  . Diabetes Mellitus II Father   . Thyroid disease Father   .  Breast cancer Maternal Aunt   . Hypertension Mother   . Heart Problems Brother   . Leukemia Maternal Aunt   . Colon cancer Neg Hx   . Kidney cancer Neg Hx   . Bladder Cancer Neg Hx     SOCIAL HX: reviewed.    Current Outpatient Medications:  .  aspirin EC 325 MG tablet, Take 325 mg by mouth daily. , Disp: , Rfl:  .  Calcium Carb-Cholecalciferol (CALCIUM 500 +D) 500-400 MG-UNIT TABS, Take 1 tablet by mouth daily., Disp: , Rfl:  .  Cholecalciferol (VITAMIN D-3) 1000 units CAPS, Take 1,000 Units by mouth daily. , Disp: , Rfl:  .  Chromium 1 MG CAPS, Take 1 mg by mouth daily., Disp: , Rfl:  .  gabapentin (NEURONTIN) 300 MG capsule, Take 1 capsule (300 mg total) by mouth at bedtime as needed. (Patient taking differently: Take 300-600 mg by mouth at bedtime as needed. ), Disp: 30 capsule, Rfl: 1 .  linaclotide (LINZESS) 290 MCG CAPS capsule, Take 290 mcg by mouth daily as needed (GI symptoms). , Disp: , Rfl:  .  magnesium oxide (MAG-OX) 400 MG tablet, Take 400 mg daily by mouth., Disp: , Rfl:  .  metoprolol succinate (TOPROL-XL) 100 MG 24 hr tablet, Take 1 tablet (100 mg total) by mouth daily. Take with or immediately following a meal., Disp: 90 tablet, Rfl: 3 .  montelukast (SINGULAIR) 10 MG tablet, TAKE 1 TABLET BY MOUTH AT  BEDTIME (Patient taking differently: Take 10 mg by mouth at bedtime. ), Disp: 90 tablet, Rfl: 3 .  Multiple Vitamin (MULTIVITAMIN) tablet, Take 1 tablet by mouth daily., Disp: , Rfl:  .  naproxen sodium (ALEVE) 220 MG tablet, Take 220-440 mg by mouth daily as needed (pain)., Disp: , Rfl:  .  ondansetron (ZOFRAN) 4 MG tablet, Take 1 tablet (4 mg total) by mouth 2 (two) times daily as needed for nausea or vomiting., Disp: 15 tablet, Rfl: 0 .  predniSONE (DELTASONE) 10 MG tablet, Take 4 tablets x 1 day and then decrease by 1/2 tablet per day until down to zero mg., Disp: 18 tablet, Rfl: 0 .  RABEprazole (ACIPHEX) 20 MG tablet, Take 20 mg by mouth 2 (two) times daily., Disp: ,  Rfl:  .  rosuvastatin (CRESTOR) 10 MG tablet, TAKE 1 TABLET BY MOUTH  DAILY, Disp: 90 tablet, Rfl: 3 .  sucralfate (CARAFATE) 1 g tablet, Take 1 g by mouth daily as needed (GI sympotms). , Disp: , Rfl:  .  venlafaxine XR (EFFEXOR-XR) 150 MG 24 hr capsule, TAKE 1 CAPSULE BY MOUTH  DAILY WITH BREAKFAST (Patient taking differently: Take 150 mg by mouth daily with breakfast. ), Disp: 90 capsule, Rfl: 3 .  vitamin B-12 (CYANOCOBALAMIN) 1000 MCG tablet, Take 1,000 mcg by mouth daily., Disp: , Rfl:   EXAM:  VITALS per patient if applicable: 55.2.  080/22,  65  GENERAL: alert, oriented, appears well and in no acute distress  HEENT: atraumatic, conjunttiva clear, no obvious abnormalities on inspection of external nose and ears  NECK: normal movements of the head and neck  LUNGS: on inspection no signs of respiratory distress, breathing rate appears normal, no obvious gross SOB, gasping or wheezing  CV: no obvious cyanosis  PSYCH/NEURO: pleasant and cooperative, no obvious depression or anxiety, speech and thought processing grossly intact  ASSESSMENT AND PLAN:  Discussed the following assessment and plan:  Anemia Has been evaluated by hematology.  Following cbc.   Combined hyperlipidemia On crestor.  Low cholesterol diet and exercise.  Follow lipid panel and liver function tests.    Diastolic heart failure (HCC) Does not appear to have evidence of volume overload.  Off lasix.  Follow.   Essential hypertension Blood pressure under good control.  Continue same medication regimen.  Follow pressures.  Follow metabolic panel.    Gastroesophageal reflux disease Upper symptoms appear to be controlled.  Follow.    Hyperglycemia Low carb diet and exercise.  Follow met b and a1c.   Lower extremity weakness Being followed by rheumatology and neurology.  MRI lumbar spine as outlined.  Worsening degenerative joint disease as outlined.  Being referred to dr Sharlet Salina.    OSA (obstructive sleep  apnea) CPAP.   Stress Increased stress.  Appears to be handling things well.  Follow.    Thrombocytosis Worked up by hematology.  Felt to be reactive.  Follow cbc.   Nasal congestion Nasal congestion, drainage, chest heaviness and cough as outlined.  Nasacort/flonase and saline nasal spray as directed.  Robitussin/mucinex as directed.  Prednisone taper.  Follow closely.  Call with update.     No orders of the defined types were placed in this encounter.   Meds ordered this encounter  Medications  . predniSONE (DELTASONE) 10 MG tablet    Sig: Take 4 tablets x 1 day and then decrease by 1/2 tablet per day until down to zero mg.    Dispense:  18 tablet    Refill:  0     I discussed the assessment and treatment plan with the patient. The patient was provided an opportunity to ask questions and all were answered. The patient agreed with the plan and demonstrated an understanding of the instructions.   The patient was advised to call back or seek an in-person evaluation if the symptoms worsen or if the condition fails to improve as anticipated.   Einar Pheasant, MD

## 2019-10-02 ENCOUNTER — Encounter: Payer: Self-pay | Admitting: Internal Medicine

## 2019-10-02 LAB — NOVEL CORONAVIRUS, NAA: SARS-CoV-2, NAA: NOT DETECTED

## 2019-10-02 NOTE — Telephone Encounter (Signed)
Is the respiratory clinic just for known covid positive pts?  Need to know specific symptoms.  I just gave her prednisone.  Did this help?

## 2019-10-04 NOTE — Telephone Encounter (Signed)
Called pt to get update. She says prednisone has helped some but she is stil having some chest tightness and congestion in her chest. Just wants someone to listen to her chest. Also complaining about having a headache today. Given her symptoms and our office being closed due to weather, recommended urgent care evaluation and gave info for [facility] walk in

## 2019-10-05 ENCOUNTER — Telehealth: Payer: Self-pay | Admitting: Internal Medicine

## 2019-10-05 DIAGNOSIS — J22 Unspecified acute lower respiratory infection: Secondary | ICD-10-CM | POA: Diagnosis not present

## 2019-10-05 DIAGNOSIS — R509 Fever, unspecified: Secondary | ICD-10-CM | POA: Diagnosis not present

## 2019-10-05 DIAGNOSIS — R05 Cough: Secondary | ICD-10-CM | POA: Diagnosis not present

## 2019-10-05 NOTE — Telephone Encounter (Signed)
Left message for patient to call back and schedule Medicare Annual Wellness Visit (AWV) either virtually or audio only.  No hx of AWV; please schedule at anytime with Denisa O'Brien-Blaney at [facility] on [address] 2/25

## 2019-10-07 ENCOUNTER — Encounter: Payer: Self-pay | Admitting: Internal Medicine

## 2019-10-07 NOTE — Assessment & Plan Note (Signed)
Worked up by hematology.  Felt to be reactive.  Follow cbc.  

## 2019-10-07 NOTE — Assessment & Plan Note (Signed)
Increased stress.  Appears to be handling things well.  Follow.

## 2019-10-07 NOTE — Assessment & Plan Note (Signed)
Being followed by rheumatology and neurology.  MRI lumbar spine as outlined.  Worsening degenerative joint disease as outlined.  Being referred to dr Sharlet Salina.

## 2019-10-07 NOTE — Assessment & Plan Note (Signed)
Nasal congestion, drainage, chest heaviness and cough as outlined.  Nasacort/flonase and saline nasal spray as directed.  Robitussin/mucinex as directed.  Prednisone taper.  Follow closely.  Call with update.

## 2019-10-07 NOTE — Assessment & Plan Note (Signed)
CPAP.  

## 2019-10-07 NOTE — Assessment & Plan Note (Signed)
Upper symptoms appear to be controlled.  Follow.

## 2019-10-07 NOTE — Assessment & Plan Note (Signed)
Blood pressure under good control.  Continue same medication regimen.  Follow pressures.  Follow metabolic panel.   

## 2019-10-07 NOTE — Assessment & Plan Note (Signed)
Low carb diet and exercise.  Follow met b and a1c.  

## 2019-10-07 NOTE — Assessment & Plan Note (Signed)
Does not appear to have evidence of volume overload.  Off lasix.  Follow.

## 2019-10-07 NOTE — Assessment & Plan Note (Signed)
Has been evaluated by hematology.  Following cbc.

## 2019-10-07 NOTE — Assessment & Plan Note (Signed)
On crestor.  Low cholesterol diet and exercise.  Follow lipid panel and liver function tests.   

## 2019-10-12 ENCOUNTER — Ambulatory Visit: Payer: Medicare Other | Admitting: Internal Medicine

## 2019-10-15 ENCOUNTER — Other Ambulatory Visit: Payer: Self-pay

## 2019-10-15 ENCOUNTER — Ambulatory Visit
Admission: RE | Admit: 2019-10-15 | Discharge: 2019-10-15 | Disposition: A | Discharge status: Home / Self Care | Payer: Medicare Other | Source: Ambulatory Visit | Attending: Internal Medicine | Admitting: Internal Medicine

## 2019-10-15 ENCOUNTER — Encounter: Payer: Self-pay | Admitting: Internal Medicine

## 2019-10-15 ENCOUNTER — Telehealth (INDEPENDENT_AMBULATORY_CARE_PROVIDER_SITE_OTHER): Payer: Medicare Other | Admitting: Internal Medicine

## 2019-10-15 VITALS — BP 128/71 | Wt 233.0 lb

## 2019-10-15 DIAGNOSIS — R05 Cough: Secondary | ICD-10-CM | POA: Insufficient documentation

## 2019-10-15 DIAGNOSIS — R059 Cough, unspecified: Secondary | ICD-10-CM

## 2019-10-15 DIAGNOSIS — R0602 Shortness of breath: Secondary | ICD-10-CM | POA: Diagnosis not present

## 2019-10-15 MED ORDER — AZITHROMYCIN 250 MG PO TABS: ORAL_TABLET | ORAL | 0 refills | Status: DC

## 2019-10-15 MED ORDER — PREDNISONE 10 MG PO TABS: ORAL_TABLET | ORAL | 0 refills | Status: DC

## 2019-10-15 MED ORDER — ALBUTEROL SULFATE HFA 108 (90 BASE) MCG/ACT IN AERS: 2.0000 | INHALATION_SPRAY | Freq: Four times a day (QID) | RESPIRATORY_TRACT | 1 refills | Status: DC | PRN

## 2019-10-18 ENCOUNTER — Other Ambulatory Visit: Payer: Self-pay | Admitting: Internal Medicine

## 2019-10-18 DIAGNOSIS — R059 Cough, unspecified: Secondary | ICD-10-CM

## 2019-10-18 DIAGNOSIS — R05 Cough: Secondary | ICD-10-CM

## 2019-10-18 NOTE — Progress Notes (Signed)
Order placed for pulmonary referral.  

## 2019-10-21 ENCOUNTER — Encounter: Payer: Self-pay | Admitting: Internal Medicine

## 2019-10-21 NOTE — Progress Notes (Signed)
Patient ID: Nicole Sims, female   DOB: Mar 31, 1951, 69 y.o.   MRN: [id number]   Virtual Visit via video Note  This visit type was conducted due to national recommendations for restrictions regarding the COVID-19 pandemic (e.g. social distancing).  This format is felt to be most appropriate for this patient at this time.  All issues noted in this document were discussed and addressed.  No physical exam was performed (except for noted visual exam findings with Video Visits).   I connected with [name] by a video enabled telemedicine application and verified that I am speaking with the correct person using two identifiers. Location patient: home Location provider: work  Persons participating in the virtual visit: patient, provider  The limitations, risks, security and privacy concerns of performing an evaluation and management service by video and the availability of in person appointments have been discussed.  The patient expressed understanding and agreed to proceed.   Reason for visit: work in appt.    HPI: Has been evaluated recently for increased congestion and cough.  Seen 2/15 and placed on prednisone taper.  Reevaluated 10/05/19 - placed on zpak.  She reports persistent cough - now for 3 weeks.  No fever.  Increased chest congestion, increased drainage.  "Hard coughing" at times.  Some nausea.  No vomiting.  No acid reflux.  No abdominal pain.  No diarrhea.  Using symbicort.  Did feel some better on zpak, but felt was not long enough course.  Has been tested for covid - negative.  Pulse ox 99%.     ROS: See pertinent positives and negatives per HPI.  Past Medical History:  Diagnosis Date  . Anemia   . Anxiety   . Asthma   . Cataract cortical, senile   . CHF (congestive heart failure) ([place])   . Chronic headaches   . Diastolic heart failure ([place])   . Diverticulosis   . Environmental allergies   . GERD (gastroesophageal reflux disease)   . Heart murmur   . Hyperglycemia   .  Hyperlipidemia   . Hypertension   . Obesity   . Osteoarthritis   . Palpitations   . Restless leg syndrome   . Sleep apnea   . Thrombocytosis ([place])   . Urinary incontinence    mixed  . Venous insufficiency   . Vitamin D deficiency     Past Surgical History:  Procedure Laterality Date  . BLADDER SURGERY     x2   [place] [facility] [place]  . BREAST CYST ASPIRATION Bilateral 2005   approximate year  . CARDIAC CATHETERIZATION     [facility] Massed  . CERVICAL CONE BIOPSY     [facility]  . CHOLECYSTECTOMY    . COLONOSCOPY WITH PROPOFOL N/A 07/19/2016   Procedure: COLONOSCOPY WITH PROPOFOL;  Surgeon: Manya Silvas, MD;  Location: [facility] ENDOSCOPY;  Service: Endoscopy;  Laterality: N/A;  . COLONOSCOPY WITH PROPOFOL N/A 10/20/2018   Procedure: COLONOSCOPY WITH PROPOFOL;  Surgeon: Lollie Sails, MD;  Location: [facility] ENDOSCOPY;  Service: Endoscopy;  Laterality: N/A;  . ESOPHAGOGASTRODUODENOSCOPY (EGD) WITH PROPOFOL N/A 07/19/2016   Procedure: ESOPHAGOGASTRODUODENOSCOPY (EGD) WITH PROPOFOL;  Surgeon: Manya Silvas, MD;  Location: [facility] ENDOSCOPY;  Service: Endoscopy;  Laterality: N/A;  . FLEXIBLE SIGMOIDOSCOPY    . KNEE ARTHROSCOPY  08/13/08  . knee replacement and revision     left  . RECTOCELE REPAIR    . RIGHT/LEFT HEART CATH AND CORONARY ANGIOGRAPHY Bilateral 04/10/2018   Procedure: RIGHT/LEFT HEART CATH AND CORONARY ANGIOGRAPHY;  Surgeon: Wellington Hampshire, MD;  Location: [facility] LAB;  Service: Cardiovascular;  Laterality: Bilateral;  . ROTATOR CUFF REPAIR     bilateral  . SHOULDER SURGERY  11/17/05  . TONSILLECTOMY  1962  . VAGINAL HYSTERECTOMY  1974   abnormal pap and carcinoma in situ    Family History  Problem Relation Age of Onset  . Diabetes Mellitus II Father   . Thyroid disease Father   . Breast cancer Maternal Aunt   . Hypertension Mother   . Heart Problems Brother   . Leukemia Maternal Aunt   . Colon cancer Neg Hx   . Kidney cancer Neg Hx   . Bladder Cancer Neg Hx      SOCIAL HX: reviewed.    Current Outpatient Medications:  .  albuterol (VENTOLIN HFA) 108 (90 Base) MCG/ACT inhaler, Inhale 2 puffs into the lungs every 6 (six) hours as needed for wheezing or shortness of breath., Disp: 18 g, Rfl: 1 .  aspirin EC 325 MG tablet, Take 325 mg by mouth daily. , Disp: , Rfl:  .  azithromycin (ZITHROMAX) 250 MG tablet, Take 2 tablets x 1 day and then one tablet per day for four more days., Disp: 6 tablet, Rfl: 0 .  Calcium Carb-Cholecalciferol (CALCIUM 500 +D) 500-400 MG-UNIT TABS, Take 1 tablet by mouth daily., Disp: , Rfl:  .  Cholecalciferol (VITAMIN D-3) 1000 units CAPS, Take 1,000 Units by mouth daily. , Disp: , Rfl:  .  Chromium 1 MG CAPS, Take 1 mg by mouth daily., Disp: , Rfl:  .  gabapentin (NEURONTIN) 300 MG capsule, Take 1 capsule (300 mg total) by mouth at bedtime as needed. (Patient taking differently: Take 300-600 mg by mouth at bedtime as needed. ), Disp: 30 capsule, Rfl: 1 .  linaclotide (LINZESS) 290 MCG CAPS capsule, Take 290 mcg by mouth daily as needed (GI symptoms). , Disp: , Rfl:  .  magnesium oxide (MAG-OX) 400 MG tablet, Take 400 mg daily by mouth., Disp: , Rfl:  .  metoprolol succinate (TOPROL-XL) 100 MG 24 hr tablet, Take 1 tablet (100 mg total) by mouth daily. Take with or immediately following a meal., Disp: 90 tablet, Rfl: 3 .  montelukast (SINGULAIR) 10 MG tablet, TAKE 1 TABLET BY MOUTH AT  BEDTIME (Patient taking differently: Take 10 mg by mouth at bedtime. ), Disp: 90 tablet, Rfl: 3 .  Multiple Vitamin (MULTIVITAMIN) tablet, Take 1 tablet by mouth daily., Disp: , Rfl:  .  naproxen sodium (ALEVE) 220 MG tablet, Take 220-440 mg by mouth daily as needed (pain)., Disp: , Rfl:  .  ondansetron (ZOFRAN) 4 MG tablet, Take 1 tablet (4 mg total) by mouth 2 (two) times daily as needed for nausea or vomiting., Disp: 15 tablet, Rfl: 0 .  predniSONE (DELTASONE) 10 MG tablet, Take 6 tablets x 1 day and then decrease by 1/2 tablet per day until down  to zero mg., Disp: 39 tablet, Rfl: 0 .  RABEprazole (ACIPHEX) 20 MG tablet, Take 20 mg by mouth 2 (two) times daily., Disp: , Rfl:  .  rosuvastatin (CRESTOR) 10 MG tablet, TAKE 1 TABLET BY MOUTH  DAILY, Disp: 90 tablet, Rfl: 3 .  sucralfate (CARAFATE) 1 g tablet, Take 1 g by mouth daily as needed (GI sympotms). , Disp: , Rfl:  .  venlafaxine XR (EFFEXOR-XR) 150 MG 24 hr capsule, TAKE 1 CAPSULE BY MOUTH  DAILY WITH BREAKFAST (Patient taking differently: Take 150 mg by mouth daily with breakfast. ),  Disp: 90 capsule, Rfl: 3 .  vitamin B-12 (CYANOCOBALAMIN) 1000 MCG tablet, Take 1,000 mcg by mouth daily., Disp: , Rfl:   EXAM:  VITALS per patient if applicable: 123456  GENERAL: alert, oriented, appears well and in no acute distress  HEENT: atraumatic, conjunttiva clear, no obvious abnormalities on inspection of external nose and ears  NECK: normal movements of the head and neck  LUNGS: on inspection no signs of respiratory distress, breathing rate appears normal, no obvious gross SOB, gasping or wheezing  CV: no obvious cyanosis  PSYCH/NEURO: pleasant and cooperative, no obvious depression or anxiety, speech and thought processing grossly intact  ASSESSMENT AND PLAN:  Discussed the following assessment and plan:  Cough Persistent cough and congestion as outlined.  Previously treated with steroids and zpak.  Persistent "hard" coughing and chest congestion.  flonase nasal spray and mucinex/robitussin as directed.  Prednisone taper and extension of azithromycin.  Check cxr.  Rest.  Fluids.  Follow.  If persistent symptoms, refer to pulmonary.    Orders Placed This Encounter  Procedures  . DG Chest 2 View    Standing Status:   Future    Number of Occurrences:   1    Standing Expiration Date:   12/14/2020    Order Specific Question:   Reason for Exam (SYMPTOM  OR DIAGNOSIS REQUIRED)    Answer:   persistetn cough and congestion.  multiple covid tests negative.    Order Specific Question:    Preferred imaging location?    Answer:   [facility]    Order Specific Question:   Radiology Contrast Protocol - do NOT remove file path    Answer:   [email or web address]    Meds ordered this encounter  Medications  . predniSONE (DELTASONE) 10 MG tablet    Sig: Take 6 tablets x 1 day and then decrease by 1/2 tablet per day until down to zero mg.    Dispense:  39 tablet    Refill:  0  . azithromycin (ZITHROMAX) 250 MG tablet    Sig: Take 2 tablets x 1 day and then one tablet per day for four more days.    Dispense:  6 tablet    Refill:  0  . albuterol (VENTOLIN HFA) 108 (90 Base) MCG/ACT inhaler    Sig: Inhale 2 puffs into the lungs every 6 (six) hours as needed for wheezing or shortness of breath.    Dispense:  18 g    Refill:  1     I discussed the assessment and treatment plan with the patient. The patient was provided an opportunity to ask questions and all were answered. The patient agreed with the plan and demonstrated an understanding of the instructions.   The patient was advised to call back or seek an in-person evaluation if the symptoms worsen or if the condition fails to improve as anticipated.   Einar Pheasant, MD

## 2019-10-21 NOTE — Assessment & Plan Note (Signed)
Persistent cough and congestion as outlined.  Previously treated with steroids and zpak.  Persistent "hard" coughing and chest congestion.  flonase nasal spray and mucinex/robitussin as directed.  Prednisone taper and extension of azithromycin.  Check cxr.  Rest.  Fluids.  Follow.  If persistent symptoms, refer to pulmonary.

## 2019-10-24 ENCOUNTER — Ambulatory Visit
Admission: RE | Admit: 2019-10-24 | Discharge: 2019-10-24 | Disposition: A | Discharge status: Home / Self Care | Payer: Medicare Other | Source: Ambulatory Visit | Attending: Pulmonary Disease | Admitting: Pulmonary Disease

## 2019-10-24 ENCOUNTER — Other Ambulatory Visit
Admission: RE | Admit: 2019-10-24 | Discharge: 2019-10-24 | Disposition: A | Discharge status: Home / Self Care | Payer: Medicare Other | Source: Ambulatory Visit | Attending: Pulmonary Disease | Admitting: Pulmonary Disease

## 2019-10-24 ENCOUNTER — Other Ambulatory Visit: Payer: Self-pay | Admitting: Oncology

## 2019-10-24 ENCOUNTER — Ambulatory Visit: Payer: Medicare Other | Admitting: Pulmonary Disease

## 2019-10-24 ENCOUNTER — Other Ambulatory Visit: Payer: Self-pay

## 2019-10-24 ENCOUNTER — Encounter: Payer: Self-pay | Admitting: Pulmonary Disease

## 2019-10-24 ENCOUNTER — Encounter (HOSPITAL_BASED_OUTPATIENT_CLINIC_OR_DEPARTMENT_OTHER)
Admission: RE | Admit: 2019-10-24 | Discharge: 2019-10-24 | Disposition: A | Discharge status: Home / Self Care | Payer: Medicare Other | Source: Ambulatory Visit | Attending: Pulmonary Disease | Admitting: Pulmonary Disease

## 2019-10-24 ENCOUNTER — Telehealth: Payer: Self-pay

## 2019-10-24 VITALS — BP 120/74 | HR 69 | Temp 96.9°F | Ht 65.0 in | Wt 236.0 lb

## 2019-10-24 DIAGNOSIS — R7989 Other specified abnormal findings of blood chemistry: Secondary | ICD-10-CM | POA: Insufficient documentation

## 2019-10-24 DIAGNOSIS — R5383 Other fatigue: Secondary | ICD-10-CM | POA: Diagnosis not present

## 2019-10-24 DIAGNOSIS — R0602 Shortness of breath: Secondary | ICD-10-CM | POA: Diagnosis not present

## 2019-10-24 DIAGNOSIS — R079 Chest pain, unspecified: Secondary | ICD-10-CM | POA: Diagnosis not present

## 2019-10-24 DIAGNOSIS — D72829 Elevated white blood cell count, unspecified: Secondary | ICD-10-CM

## 2019-10-24 DIAGNOSIS — G4733 Obstructive sleep apnea (adult) (pediatric): Secondary | ICD-10-CM

## 2019-10-24 DIAGNOSIS — I519 Heart disease, unspecified: Secondary | ICD-10-CM

## 2019-10-24 DIAGNOSIS — Z9989 Dependence on other enabling machines and devices: Secondary | ICD-10-CM

## 2019-10-24 LAB — COMPREHENSIVE METABOLIC PANEL
ALT: 16 U/L (ref 0–44)
AST: 11 U/L — ABNORMAL LOW (ref 15–41)
Albumin: 4 g/dL (ref 3.5–5.0)
Alkaline Phosphatase: 83 U/L (ref 38–126)
Anion gap: 13 (ref 5–15)
BUN: 20 mg/dL (ref 8–23)
CO2: 26 mmol/L (ref 22–32)
Calcium: 9.4 mg/dL (ref 8.9–10.3)
Chloride: 102 mmol/L (ref 98–111)
Creatinine, Ser: 1.03 mg/dL — ABNORMAL HIGH (ref 0.44–1.00)
GFR calc Af Amer: >60 mL/min (ref >60)
GFR calc non Af Amer: 55 mL/min — ABNORMAL LOW (ref >60)
Glucose, Bld: 92 mg/dL (ref 70–99)
Potassium: 3.9 mmol/L (ref 3.5–5.1)
Sodium: 141 mmol/L (ref 135–145)
Total Bilirubin: 0.7 mg/dL (ref 0.3–1.2)
Total Protein: 8.1 g/dL (ref 6.5–8.1)

## 2019-10-24 LAB — CBC WITH DIFFERENTIAL/PLATELET
Abs Immature Granulocytes: 0.07 10*3/uL (ref 0.00–0.07)
Basophils Absolute: 0.1 10*3/uL (ref 0.0–0.1)
Basophils Relative: 0 %
Eosinophils Absolute: 0.2 10*3/uL (ref 0.0–0.5)
Eosinophils Relative: 1 %
HCT: 43.1 % (ref 36.0–46.0)
Hemoglobin: 13.5 g/dL (ref 12.0–15.0)
Immature Granulocytes: 0 %
Lymphocytes Relative: 48 %
Lymphs Abs: 7.4 10*3/uL — ABNORMAL HIGH (ref 0.7–4.0)
MCH: 28.5 pg (ref 26.0–34.0)
MCHC: 31.3 g/dL (ref 30.0–36.0)
MCV: 90.9 fL (ref 80.0–100.0)
Monocytes Absolute: 1.1 10*3/uL — ABNORMAL HIGH (ref 0.1–1.0)
Monocytes Relative: 7 %
Neutro Abs: 6.9 10*3/uL (ref 1.7–7.7)
Neutrophils Relative %: 44 %
Platelets: 488 10*3/uL — ABNORMAL HIGH (ref 150–400)
RBC: 4.74 MIL/uL (ref 3.87–5.11)
RDW: 13.4 % (ref 11.5–15.5)
Smear Review: NORMAL
WBC: 15.6 10*3/uL — ABNORMAL HIGH (ref 4.0–10.5)
nRBC: 0 % (ref 0.0–0.2)

## 2019-10-24 LAB — TSH: TSH: 2.008 u[IU]/mL (ref 0.350–4.500)

## 2019-10-24 LAB — FIBRIN DERIVATIVES D-DIMER (ARMC ONLY): Fibrin derivatives D-dimer (ARMC): 537.98 ng/mL (FEU) — ABNORMAL HIGH (ref 0.00–499.00)

## 2019-10-24 LAB — PATHOLOGIST SMEAR REVIEW

## 2019-10-24 MED ORDER — TECHNETIUM TO 99M ALBUMIN AGGREGATED
4.0000 | Freq: Once | INTRAVENOUS | Status: AC | PRN
  Administered 2019-10-24: 4.543 via INTRAVENOUS

## 2019-10-24 NOTE — Telephone Encounter (Signed)
Pt is aware of results and voiced her understanding. Nothing further is needed.  

## 2019-10-24 NOTE — Telephone Encounter (Signed)
-----   Message from Tyler Pita, MD sent at 10/24/2019  2:59 PM EST ----- No clots noted on study.  Normal.

## 2019-10-24 NOTE — Patient Instructions (Signed)
We are going to do some blood test to check why you are so breathless.  You may need a CT scan of the chest but we will determine this by the blood tests.  We are going to recheck your echocardiogram.  We will see him in follow-up on 19 March at 11:30 AM.  We will call you prior to that should any tests required urgent attention.

## 2019-10-24 NOTE — Telephone Encounter (Signed)
Lm for pt.  Unable to document in result note, due to received an error message.

## 2019-10-24 NOTE — Progress Notes (Signed)
 Assessment & Plan:  1. SOB (shortness of breath) (Primary) - CBC w/Diff; Future - Comp Met (CMET); Future - TSH; Future - T3; Future - T4; Future - ECHOCARDIOGRAM COMPLETE; Future - DG Chest 2 View; Future  2. Other fatigue - CBC w/Diff; Future  3. Left ventricular diastolic dysfunction  4. OSA on CPAP   Patient Instructions  We are going to do some blood test to check why you are so breathless.  You may need a CT scan of the chest but we will determine this by the blood tests.  We are going to recheck your echocardiogram.  We will see him in follow-up on 19 March at 11:30 AM.  We will call you prior to that should any tests required urgent attention.   Please note: late entry documentation due to logistical difficulties during COVID-19 pandemic. This note is filed for information purposes only, and is not intended to be used for billing, nor does it represent the full scope/nature of the visit in question. Please see any associated scanned media linked to date of encounter for additional pertinent information.  Subjective:    HPI: Nicole Sims is a 69 y.o. female presenting to the pulmonology clinic on 10/24/2019 with report of: Follow-up (former DR pt- pt reports of increased sob with talking/exertion and dry cough at times prod with white to yellowish mucus x28mo. )   Nicole Sims is a 69 year old former smoker (quit 1985, 15-pack-year history) who presents for evaluation of shortness of breath of 1 month duration.  She is a patient of Dr. Vicenta Lennert and Dr. Verdia.  She last saw Dr. Verdia in September 2019 for issues with obstructive sleep apnea.  She is with diastolic dysfunction, iron  deficiency anemia, thrombocytopenia, and episodes of asthmatic bronchitis that appear to be seasonal.  There is also a history of chronic right sphenoid sinusitis.  Approximately a month ago as noted above she felt increasing shortness of breath  DATA: Chest x-ray May  2018>> clear lungs. Exhaled nitric oxide  testing December 2018 was 8 PPB IgE -6  PFT 08/04/2017>> FEV1 98%, ratio 88, no significant bronchodilator response, FVC 85%, DLCO 82%. CT maxillofacial 08/15/2017>> images personally reviewed.  Findings consistent with chronic right sphenoid sinusitis. CBC 08/03/2017>> absolute eosinophil count 300, as high as 400 in the past. Right and left heart cath 04/10/2018>> normal coronary arteries, wedge pressure 13, LVEDP 17, cardiac output 7.6.  Consistent with mild diastolic heart failure.  Outpatient Encounter Medications as of 10/24/2019  Medication Sig Note   RABEprazole (ACIPHEX) 20 MG tablet Take 20 mg by mouth 2 (two) times daily.    [DISCONTINUED] albuterol  (VENTOLIN  HFA) 108 (90 Base) MCG/ACT inhaler Inhale 2 puffs into the lungs every 6 (six) hours as needed for wheezing or shortness of breath.    [DISCONTINUED] aspirin  EC 325 MG tablet Take 325 mg by mouth daily.     [DISCONTINUED] azithromycin  (ZITHROMAX ) 250 MG tablet Take 2 tablets x 1 day and then one tablet per day for four more days.    [DISCONTINUED] budesonide -formoterol  (SYMBICORT ) 80-4.5 MCG/ACT inhaler Inhale 2 puffs into the lungs 2 (two) times daily.    [DISCONTINUED] Calcium  Carb-Cholecalciferol  (CALCIUM  500 +D) 500-400 MG-UNIT TABS Take 1 tablet by mouth daily. (Patient not taking: Reported on 06/05/2020)    [DISCONTINUED] Cholecalciferol  (VITAMIN D -3) 1000 units CAPS Take 1,000 Units by mouth daily.    [DISCONTINUED] fluticasone  (FLONASE ) 50 MCG/ACT nasal spray Place 2 sprays into both nostrils daily. (Patient not taking: Reported  on 09/23/2020)    [DISCONTINUED] gabapentin  (NEURONTIN ) 300 MG capsule Take 1 capsule (300 mg total) by mouth at bedtime as needed. (Patient not taking: Reported on 09/23/2020)    [DISCONTINUED] magnesium  oxide (MAG-OX) 400 MG tablet Take 400 mg daily by mouth. (Patient not taking: Reported on 06/05/2020)    [DISCONTINUED] montelukast  (SINGULAIR ) 10 MG tablet TAKE  1 TABLET BY MOUTH AT  BEDTIME (Patient taking differently: Take 10 mg by mouth at bedtime.)    [DISCONTINUED] Multiple Vitamin (MULTIVITAMIN) tablet Take 1 tablet by mouth daily.    [DISCONTINUED] ondansetron  (ZOFRAN ) 4 MG tablet Take 1 tablet (4 mg total) by mouth 2 (two) times daily as needed for nausea or vomiting.    [DISCONTINUED] predniSONE  (DELTASONE ) 10 MG tablet Take 6 tablets x 1 day and then decrease by 1/2 tablet per day until down to zero mg.    [DISCONTINUED] rosuvastatin  (CRESTOR ) 10 MG tablet TAKE 1 TABLET BY MOUTH  DAILY 06/05/2020: Taking 3 days weekly   [DISCONTINUED] sucralfate  (CARAFATE ) 1 g tablet Take 1 g by mouth daily as needed (GI sympotms).  (Patient not taking: Reported on 08/23/2023)    [DISCONTINUED] venlafaxine  XR (EFFEXOR -XR) 150 MG 24 hr capsule TAKE 1 CAPSULE BY MOUTH  DAILY WITH BREAKFAST (Patient taking differently: Take 150 mg by mouth daily with breakfast. )    [DISCONTINUED] vitamin B-12 (CYANOCOBALAMIN ) 1000 MCG tablet Take 1,000 mcg by mouth daily.    [DISCONTINUED] Chromium  1 MG CAPS Take 1 mg by mouth daily.    [DISCONTINUED] linaclotide  (LINZESS ) 290 MCG CAPS capsule Take 290 mcg by mouth daily as needed (GI symptoms).     [DISCONTINUED] metoprolol  succinate (TOPROL -XL) 100 MG 24 hr tablet Take 1 tablet (100 mg total) by mouth daily. Take with or immediately following a meal.    [DISCONTINUED] naproxen sodium (ALEVE) 220 MG tablet Take 220-440 mg by mouth daily as needed (pain).    No facility-administered encounter medications on file as of 10/24/2019.      Objective:   Vitals:   10/24/19 0828  BP: 120/74  Pulse: 69  Temp: (!) 96.9 F (36.1 C)  Height: 5' 5 (1.651 m)  Weight: 236 lb (107 kg)  SpO2: 96%  TempSrc: Temporal  BMI (Calculated): 39.27     Physical exam documentation is limited by delayed entry of information.

## 2019-10-25 LAB — T3: T3, Total: 83 ng/dL (ref 71–180)

## 2019-10-25 LAB — T4: T4, Total: 7.4 ug/dL (ref 4.5–12.0)

## 2019-10-30 ENCOUNTER — Other Ambulatory Visit: Payer: Self-pay

## 2019-10-30 ENCOUNTER — Ambulatory Visit
Admission: RE | Admit: 2019-10-30 | Discharge: 2019-10-30 | Disposition: A | Discharge status: Home / Self Care | Payer: Medicare Other | Source: Ambulatory Visit | Attending: Pulmonary Disease | Admitting: Pulmonary Disease

## 2019-10-30 DIAGNOSIS — G4730 Sleep apnea, unspecified: Secondary | ICD-10-CM | POA: Diagnosis not present

## 2019-10-30 DIAGNOSIS — R0600 Dyspnea, unspecified: Secondary | ICD-10-CM | POA: Diagnosis not present

## 2019-10-30 DIAGNOSIS — R0602 Shortness of breath: Secondary | ICD-10-CM | POA: Diagnosis not present

## 2019-10-30 DIAGNOSIS — I10 Essential (primary) hypertension: Secondary | ICD-10-CM | POA: Insufficient documentation

## 2019-10-30 DIAGNOSIS — R002 Palpitations: Secondary | ICD-10-CM | POA: Diagnosis not present

## 2019-10-30 NOTE — Progress Notes (Signed)
*  PRELIMINARY RESULTS* Echocardiogram 2D Echocardiogram has been performed.  Sherrie Sport 10/30/2019, 9:31 AM

## 2019-11-02 ENCOUNTER — Other Ambulatory Visit: Payer: Self-pay

## 2019-11-02 ENCOUNTER — Telehealth: Payer: Self-pay | Admitting: Pulmonary Disease

## 2019-11-02 ENCOUNTER — Ambulatory Visit: Payer: Medicare Other | Admitting: Pulmonary Disease

## 2019-11-02 ENCOUNTER — Encounter: Payer: Self-pay | Admitting: Pulmonary Disease

## 2019-11-02 ENCOUNTER — Other Ambulatory Visit
Admission: RE | Admit: 2019-11-02 | Discharge: 2019-11-02 | Disposition: A | Discharge status: Home / Self Care | Payer: Medicare Other | Source: Ambulatory Visit | Attending: Pulmonary Disease | Admitting: Pulmonary Disease

## 2019-11-02 VITALS — BP 130/78 | HR 65 | Temp 97.7°F | Ht 65.0 in | Wt 237.0 lb

## 2019-11-02 DIAGNOSIS — R0602 Shortness of breath: Secondary | ICD-10-CM

## 2019-11-02 NOTE — Patient Instructions (Addendum)
Call Dr. Collie Siad office for appointment  We will do a respiratory viral panel  We will see you in follow-up in 4 to 6 weeks time.

## 2019-11-02 NOTE — Telephone Encounter (Signed)
Dr. Mortimer Fries please advise. Is this the lab that you wanted to order.

## 2019-11-02 NOTE — Telephone Encounter (Signed)
It was a panel for influenza, and respiratory viruses as well.  They may be short on these as they have been used quite a bit due to the Covid.  Part of the it may be sent from here to [facility]  We can send her for repeat Covid and influenza testing through outpatient Covid testing.

## 2019-11-05 NOTE — Telephone Encounter (Signed)
Hey can you work on this today if you have a moment please

## 2019-11-05 NOTE — Telephone Encounter (Signed)
Per Dr Patsey Berthold- testing at this point is moot and what Nicole Sims had may have already past, make sure Nicole Sims is not running a fever and has f/u with oncology. I called and spoke with her and Nicole Sims states Nicole Sims has not been running any fevers, and her main issue is that her lymph nodes are swollen- has f/u with oncology Wed 3/24.  Nicole Sims will call sooner if needed.

## 2019-11-07 ENCOUNTER — Inpatient Hospital Stay: Payer: Medicare Other | Attending: Oncology | Admitting: Oncology

## 2019-11-07 ENCOUNTER — Encounter: Payer: Self-pay | Admitting: Oncology

## 2019-11-07 ENCOUNTER — Inpatient Hospital Stay: Payer: Medicare Other

## 2019-11-07 VITALS — BP 143/80 | HR 64 | Temp 97.7°F | Resp 18 | Wt 236.0 lb

## 2019-11-07 DIAGNOSIS — I10 Essential (primary) hypertension: Secondary | ICD-10-CM | POA: Insufficient documentation

## 2019-11-07 DIAGNOSIS — R739 Hyperglycemia, unspecified: Secondary | ICD-10-CM | POA: Insufficient documentation

## 2019-11-07 DIAGNOSIS — K219 Gastro-esophageal reflux disease without esophagitis: Secondary | ICD-10-CM | POA: Diagnosis not present

## 2019-11-07 DIAGNOSIS — Z803 Family history of malignant neoplasm of breast: Secondary | ICD-10-CM | POA: Insufficient documentation

## 2019-11-07 DIAGNOSIS — D72829 Elevated white blood cell count, unspecified: Secondary | ICD-10-CM | POA: Diagnosis not present

## 2019-11-07 DIAGNOSIS — F17210 Nicotine dependence, cigarettes, uncomplicated: Secondary | ICD-10-CM | POA: Diagnosis not present

## 2019-11-07 DIAGNOSIS — R110 Nausea: Secondary | ICD-10-CM | POA: Insufficient documentation

## 2019-11-07 DIAGNOSIS — Z833 Family history of diabetes mellitus: Secondary | ICD-10-CM | POA: Insufficient documentation

## 2019-11-07 DIAGNOSIS — D473 Essential (hemorrhagic) thrombocythemia: Secondary | ICD-10-CM | POA: Diagnosis not present

## 2019-11-07 DIAGNOSIS — E559 Vitamin D deficiency, unspecified: Secondary | ICD-10-CM | POA: Insufficient documentation

## 2019-11-07 DIAGNOSIS — Z8349 Family history of other endocrine, nutritional and metabolic diseases: Secondary | ICD-10-CM | POA: Diagnosis not present

## 2019-11-07 DIAGNOSIS — Z79899 Other long term (current) drug therapy: Secondary | ICD-10-CM | POA: Diagnosis not present

## 2019-11-07 DIAGNOSIS — Z806 Family history of leukemia: Secondary | ICD-10-CM | POA: Diagnosis not present

## 2019-11-07 DIAGNOSIS — D72820 Lymphocytosis (symptomatic): Secondary | ICD-10-CM | POA: Diagnosis not present

## 2019-11-07 DIAGNOSIS — G4730 Sleep apnea, unspecified: Secondary | ICD-10-CM | POA: Insufficient documentation

## 2019-11-07 DIAGNOSIS — E785 Hyperlipidemia, unspecified: Secondary | ICD-10-CM | POA: Diagnosis not present

## 2019-11-07 DIAGNOSIS — R0602 Shortness of breath: Secondary | ICD-10-CM | POA: Diagnosis not present

## 2019-11-07 DIAGNOSIS — I509 Heart failure, unspecified: Secondary | ICD-10-CM | POA: Insufficient documentation

## 2019-11-07 DIAGNOSIS — Z8249 Family history of ischemic heart disease and other diseases of the circulatory system: Secondary | ICD-10-CM | POA: Insufficient documentation

## 2019-11-07 DIAGNOSIS — D75839 Thrombocytosis, unspecified: Secondary | ICD-10-CM

## 2019-11-07 DIAGNOSIS — D509 Iron deficiency anemia, unspecified: Secondary | ICD-10-CM

## 2019-11-07 LAB — CBC WITH DIFFERENTIAL/PLATELET
Abs Immature Granulocytes: 0.01 10*3/uL (ref 0.00–0.07)
Basophils Absolute: 0.1 10*3/uL (ref 0.0–0.1)
Basophils Relative: 1 %
Eosinophils Absolute: 0.2 10*3/uL (ref 0.0–0.5)
Eosinophils Relative: 2 %
HCT: 39.4 % (ref 36.0–46.0)
Hemoglobin: 12.7 g/dL (ref 12.0–15.0)
Immature Granulocytes: 0 %
Lymphocytes Relative: 40 %
Lymphs Abs: 3.2 10*3/uL (ref 0.7–4.0)
MCH: 28.9 pg (ref 26.0–34.0)
MCHC: 32.2 g/dL (ref 30.0–36.0)
MCV: 89.5 fL (ref 80.0–100.0)
Monocytes Absolute: 0.6 10*3/uL (ref 0.1–1.0)
Monocytes Relative: 7 %
Neutro Abs: 4.1 10*3/uL (ref 1.7–7.7)
Neutrophils Relative %: 50 %
Platelets: 423 10*3/uL — ABNORMAL HIGH (ref 150–400)
RBC: 4.4 MIL/uL (ref 3.87–5.11)
RDW: 13.4 % (ref 11.5–15.5)
WBC: 8.1 10*3/uL (ref 4.0–10.5)
nRBC: 0 % (ref 0.0–0.2)

## 2019-11-07 LAB — FERRITIN: Ferritin: 57 ng/mL (ref 11–307)

## 2019-11-07 LAB — IRON AND TIBC
Iron: 83 ug/dL (ref 28–170)
Saturation Ratios: 21 % (ref 10.4–31.8)
TIBC: 392 ug/dL (ref 250–450)
UIBC: 309 ug/dL

## 2019-11-07 LAB — C-REACTIVE PROTEIN: CRP: 0.6 mg/dL (ref <1.0)

## 2019-11-07 LAB — SEDIMENTATION RATE: Sed Rate: 54 mm/hr — ABNORMAL HIGH (ref 0–30)

## 2019-11-07 NOTE — Progress Notes (Signed)
Patient has been evaluated by pulmonologist for chronic SOBr.  Recent labs showed abnormalities and pulmonary would like for her to see Dr. Tasia Catchings.

## 2019-11-07 NOTE — Progress Notes (Signed)
Hematology/Oncology follow up note [facility] [phone] Fax:([phone]   Patient Care Team: Einar Pheasant, MD as PCP - General (Internal Medicine)  REFERRING PROVIDER: Einar Pheasant, MD CHIEF COMPLAINTS/REASON FOR VISIT:  Evaluation of thrombocytosis and iron deficiency.  HISTORY OF PRESENTING ILLNESS:  Nicole Sims is a  69 y.o.  female with PMH listed below who was referred to me for evaluation of thromcbocytosis  Reviewed patient's labs which were obtained by PCP.  09/12/2018 labs showed elevated platelet counts at 529,000, Wbc 9.6 and hemoglobin 13.3  Reviewed patient's previous labs. Thrombocytosis onset is chronic onset , duration since at least 2014. No aggravating or elevated factors. Associated symptoms or signs:  Denies weight loss, fever, chills, fatigue, night sweats.  Context:  Smoking history: Denies Family history of polycythemia.  Denies History of iron deficiency anemia denies History of DVT denies She feels nauseated.  Scheduled for endoscopy in March 2020.  #Previous work-up showed  ak2 V617F mutation, CARL, MPL, E12-15 negative. Peripheral blood smear showed normal WBC morphology platelet appears increased.  Platelet varies in size with large body platelets noted. Iron panel showed TIBC 413, saturation 16, ferritin 30.  Received Venofer IV 200 mg x 2  # 01/22/2019 CT angios chest PE showed no evidence of PE.  INTERVAL HISTORY Nicole Sims is a 69 y.o. female who has above history reviewed by me today presents for follow up visit for management of thrombocytosis   Patient reports feeling shortness of breath recently, for about 7-8 weeks. She sees pulmonology for evaluation and Dr.Gonzalez felt that her SOB is out of proportion to her lung disease.  Blood work showed new leukocytosis. Smear was done, showed predominatly lymphocytosis, no remarkable morphological changes, no blast.  Patient had NM perfusion study  which showed low probability of pulmonary thrombosis.  Heart echo showed LVEF 72-53%, grade 1 diastolic dysfunction-impaired relaxation.    Review of Systems  Constitutional: Negative for appetite change, chills, fatigue and fever.  HENT:   Negative for hearing loss and voice change.   Eyes: Negative for eye problems.  Respiratory: Positive for shortness of breath. Negative for chest tightness and cough.   Cardiovascular: Negative for chest pain.  Gastrointestinal: Negative for abdominal distention, abdominal pain and blood in stool.  Endocrine: Negative for hot flashes.  Genitourinary: Negative for difficulty urinating and frequency.   Musculoskeletal: Negative for arthralgias.  Skin: Negative for itching and rash.  Neurological: Negative for extremity weakness.  Hematological: Negative for adenopathy.  Psychiatric/Behavioral: Negative for confusion.    MEDICAL HISTORY:  Past Medical History:  Diagnosis Date  . Anemia   . Anxiety   . Asthma   . Cataract cortical, senile   . CHF (congestive heart failure) ([place])   . Chronic headaches   . Diastolic heart failure ([place])   . Diverticulosis   . Environmental allergies   . GERD (gastroesophageal reflux disease)   . Heart murmur   . Hyperglycemia   . Hyperlipidemia   . Hypertension   . Obesity   . Osteoarthritis   . Palpitations   . Restless leg syndrome   . Sleep apnea   . Thrombocytosis ([place])   . Urinary incontinence    mixed  . Venous insufficiency   . Vitamin D deficiency     SURGICAL HISTORY: Past Surgical History:  Procedure Laterality Date  . BLADDER SURGERY     x2   [place] [facility]  . BREAST CYST ASPIRATION Bilateral 2005   approximate  year  . CARDIAC CATHETERIZATION     Nehemiah Massed  . CERVICAL CONE BIOPSY     [facility]  . CHOLECYSTECTOMY    . COLONOSCOPY WITH PROPOFOL N/A 07/19/2016   Procedure: COLONOSCOPY WITH PROPOFOL;  Surgeon: Manya Silvas, MD;  Location: [facility] ENDOSCOPY;  Service: Endoscopy;   Laterality: N/A;  . COLONOSCOPY WITH PROPOFOL N/A 10/20/2018   Procedure: COLONOSCOPY WITH PROPOFOL;  Surgeon: Lollie Sails, MD;  Location: [facility] ENDOSCOPY;  Service: Endoscopy;  Laterality: N/A;  . ESOPHAGOGASTRODUODENOSCOPY (EGD) WITH PROPOFOL N/A 07/19/2016   Procedure: ESOPHAGOGASTRODUODENOSCOPY (EGD) WITH PROPOFOL;  Surgeon: Manya Silvas, MD;  Location: [facility] ENDOSCOPY;  Service: Endoscopy;  Laterality: N/A;  . FLEXIBLE SIGMOIDOSCOPY    . KNEE ARTHROSCOPY  08/13/08  . knee replacement and revision     left  . RECTOCELE REPAIR    . RIGHT/LEFT HEART CATH AND CORONARY ANGIOGRAPHY Bilateral 04/10/2018   Procedure: RIGHT/LEFT HEART CATH AND CORONARY ANGIOGRAPHY;  Surgeon: Wellington Hampshire, MD;  Location: [place] CV LAB;  Service: Cardiovascular;  Laterality: Bilateral;  . ROTATOR CUFF REPAIR     bilateral  . SHOULDER SURGERY  11/17/05  . TONSILLECTOMY  1962  . VAGINAL HYSTERECTOMY  1974   abnormal pap and carcinoma in situ    SOCIAL HISTORY: Social History   Socioeconomic History  . Marital status: Married    Spouse name: Not on file  . Number of children: Not on file  . Years of education: Not on file  . Highest education level: Not on file  Occupational History  . Occupation: retired  Tobacco Use  . Smoking status: Former Smoker    Packs/day: 1.00    Years: 15.00    Pack years: 15.00    Quit date: 08/17/1983    Years since quitting: 36.2  . Smokeless tobacco: Never Used  Substance and Sexual Activity  . Alcohol use: Yes    Alcohol/week: 0.0 standard drinks    Comment: rarely  . Drug use: No  . Sexual activity: Not on file  Other Topics Concern  . Not on file  Social History Narrative   Lives at home with husband   Social Determinants of Health   Financial Resource Strain:   . Difficulty of Paying Living Expenses:   Food Insecurity:   . Worried About Charity fundraiser in the Last Year:   . Arboriculturist in the Last Year:   Transportation Needs:     . Film/video editor (Medical):   Marland Kitchen Lack of Transportation (Non-Medical):   Physical Activity:   . Days of Exercise per Week:   . Minutes of Exercise per Session:   Stress:   . Feeling of Stress :   Social Connections:   . Frequency of Communication with Friends and Family:   . Frequency of Social Gatherings with Friends and Family:   . Attends Religious Services:   . Active Member of Clubs or Organizations:   . Attends Archivist Meetings:   Marland Kitchen Marital Status:   Intimate Partner Violence:   . Fear of Current or Ex-Partner:   . Emotionally Abused:   Marland Kitchen Physically Abused:   . Sexually Abused:     FAMILY HISTORY: Family History  Problem Relation Age of Onset  . Diabetes Mellitus II Father   . Thyroid disease Father   . Breast cancer Maternal Aunt   . Hypertension Mother   . Heart Problems Brother   . Leukemia Maternal Aunt   .  Colon cancer Neg Hx   . Kidney cancer Neg Hx   . Bladder Cancer Neg Hx     ALLERGIES:  is allergic to augmentin [amoxicillin-pot clavulanate]; bactrim [sulfamethoxazole-trimethoprim]; doxycycline; hydrocodone-acetaminophen; levaquin [levofloxacin]; pseudoephedrine; and shellfish allergy.  MEDICATIONS:  Current Outpatient Medications  Medication Sig Dispense Refill  . albuterol (VENTOLIN HFA) 108 (90 Base) MCG/ACT inhaler Inhale 2 puffs into the lungs every 6 (six) hours as needed for wheezing or shortness of breath. 18 g 1  . aspirin EC 325 MG tablet Take 325 mg by mouth daily.     . budesonide-formoterol (SYMBICORT) 80-4.5 MCG/ACT inhaler Inhale 2 puffs into the lungs 2 (two) times daily.    . Calcium Carb-Cholecalciferol (CALCIUM 500 +D) 500-400 MG-UNIT TABS Take 1 tablet by mouth daily.    . Cholecalciferol (VITAMIN D-3) 1000 units CAPS Take 1,000 Units by mouth daily.     . fluticasone (FLONASE) 50 MCG/ACT nasal spray Place 2 sprays into both nostrils daily.    Marland Kitchen gabapentin (NEURONTIN) 300 MG capsule Take 1 capsule (300 mg total) by  mouth at bedtime as needed. (Patient taking differently: Take 300-600 mg by mouth at bedtime as needed. ) 30 capsule 1  . magnesium oxide (MAG-OX) 400 MG tablet Take 400 mg daily by mouth.    . metoprolol succinate (TOPROL-XL) 100 MG 24 hr tablet Take 1 tablet (100 mg total) by mouth daily. Take with or immediately following a meal. 90 tablet 3  . montelukast (SINGULAIR) 10 MG tablet TAKE 1 TABLET BY MOUTH AT  BEDTIME (Patient taking differently: Take 10 mg by mouth at bedtime. ) 90 tablet 3  . Multiple Vitamin (MULTIVITAMIN) tablet Take 1 tablet by mouth daily.    . ondansetron (ZOFRAN) 4 MG tablet Take 1 tablet (4 mg total) by mouth 2 (two) times daily as needed for nausea or vomiting. 15 tablet 0  . RABEprazole (ACIPHEX) 20 MG tablet Take 20 mg by mouth 2 (two) times daily.    . rosuvastatin (CRESTOR) 10 MG tablet TAKE 1 TABLET BY MOUTH  DAILY 90 tablet 3  . sucralfate (CARAFATE) 1 g tablet Take 1 g by mouth daily as needed (GI sympotms).     . venlafaxine XR (EFFEXOR-XR) 150 MG 24 hr capsule TAKE 1 CAPSULE BY MOUTH  DAILY WITH BREAKFAST (Patient taking differently: Take 150 mg by mouth daily with breakfast. ) 90 capsule 3  . vitamin B-12 (CYANOCOBALAMIN) 1000 MCG tablet Take 1,000 mcg by mouth daily.     No current facility-administered medications for this visit.     PHYSICAL EXAMINATION: ECOG PERFORMANCE STATUS: 1 - Symptomatic but completely ambulatory Vitals:   11/07/19 1335  BP: (!) 143/80  Pulse: 64  Resp: 18  Temp: 97.7 F (36.5 C)   Filed Weights   11/07/19 1335  Weight: 236 lb (107 kg)    Physical Exam Constitutional:      General: She is not in acute distress. HENT:     Head: Normocephalic and atraumatic.  Eyes:     General: No scleral icterus.    Pupils: Pupils are equal, round, and reactive to light.  Cardiovascular:     Rate and Rhythm: Normal rate and regular rhythm.     Heart sounds: Normal heart sounds.  Pulmonary:     Effort: Pulmonary effort is normal.  No respiratory distress.     Breath sounds: No wheezing.  Abdominal:     General: Bowel sounds are normal. There is no distension.     Palpations:  Abdomen is soft. There is no mass.     Tenderness: There is no abdominal tenderness.  Musculoskeletal:        General: No deformity. Normal range of motion.     Cervical back: Normal range of motion and neck supple.     Comments: Left rib cage palpable tenderness  Skin:    General: Skin is warm and dry.     Findings: No erythema or rash.  Neurological:     Mental Status: She is alert and oriented to person, place, and time.     Cranial Nerves: No cranial nerve deficit.     Coordination: Coordination normal.  Psychiatric:        Behavior: Behavior normal.        Thought Content: Thought content normal.      LABORATORY DATA:  I have reviewed the data as listed Lab Results  Component Value Date   WBC 8.1 11/07/2019   HGB 12.7 11/07/2019   HCT 39.4 11/07/2019   MCV 89.5 11/07/2019   PLT 423 (H) 11/07/2019   Recent Labs    01/03/19 0837 01/03/19 9767 01/09/19 1727 01/19/19 0927 06/21/19 0908 06/21/19 0908 06/25/19 0947 06/29/19 1100 10/24/19 0953  NA 140   < >   < > 140 140   < > 140 139 141  K 4.5   < >   < > 3.6 4.8   < > 3.7 4.3 3.9  CL 105   < >   < > 104 104   < > 104 102 102  CO2 28   < >   < > 23 28   < > '25 28 26  '$ GLUCOSE 97   < >   < > 100* 102*   < > 114* 116* 92  BUN 13   < >   < > 14 13   < > '18 16 20  '$ CREATININE 0.95   < >   < > 0.92 0.90   < > 1.03* 1.03 1.03*  CALCIUM 9.4   < >   < > 9.0 9.6   < > 9.3 9.6 9.4  GFRNONAA  --   --    < > >60  --   --  55*  --  55*  GFRAA  --   --    < > >60  --   --  >60  --  >60  PROT 7.5   < >  --   --  7.4  --  8.5*  --  8.1  ALBUMIN 4.1   < >  --   --  4.3  --  4.3  --  4.0  AST 13   < >  --   --  15  --  22  --  11*  ALT 13   < >  --   --  15  --  19  --  16  ALKPHOS 99   < >  --   --  93  --  85  --  83  BILITOT 0.4   < >  --   --  0.4  --  0.8  --  0.7  BILIDIR 0.1   --   --   --  0.1  --   --   --   --    < > = values in this interval not displayed.   Iron/TIBC/Ferritin/ %Sat    Component Value Date/Time   IRON 83 11/07/2019 1415  TIBC 392 11/07/2019 1415   FERRITIN 57 11/07/2019 1415   IRONPCTSAT 21 11/07/2019 1415    RADIOGRAPHIC STUDIES: I have personally reviewed the radiological images as listed and agreed with the findings in the report. 01/22/2019 CT angiogram  chest PE protocol No evidence of pulmonary embolus.  No acute cardiopulmonary disease. DG Chest 2 View  Result Date: 10/24/2019 CLINICAL DATA:  Shortness of breath and chest pain EXAM: CHEST - 2 VIEW COMPARISON:  October 15, 2019. FINDINGS: Lungs are clear. Heart size and pulmonary vascularity are normal. No adenopathy. No pneumothorax. No bone lesions. IMPRESSION: Lungs clear.  Cardiac silhouette within normal limits. Electronically Signed   By: Lowella Grip III M.D.   On: 10/24/2019 14:51   DG Chest 2 View  Result Date: 10/16/2019 CLINICAL DATA:  Pt reports SOB and dry cough x 3 wks. 3 negative COVID test. Former smoker. Hx of asthma, CHF, heart murmur and HTN.persistetn cough and congestion. multiple covid tests negative. EXAM: CHEST - 2 VIEW COMPARISON:  Chest CT 01/22/2019 FINDINGS: Normal mediastinum and cardiac silhouette. Normal pulmonary vasculature. No evidence of effusion, infiltrate, or pneumothorax. No acute bony abnormality. IMPRESSION: No active cardiopulmonary disease. Electronically Signed   By: Suzy Bouchard M.D.   On: 10/16/2019 08:09   MR LUMBAR SPINE WO CONTRAST  Result Date: 08/22/2019 CLINICAL DATA:  Bilateral leg weakness. Additional history provided: Bilateral leg weakness, right greater than left for 2 months. Patient awoke on 06/25/2019 unable to walk or control leg movements. EXAM: MRI LUMBAR SPINE WITHOUT CONTRAST TECHNIQUE: Multiplanar, multisequence MR imaging of the lumbar spine was performed. No intravenous contrast was administered. COMPARISON:   Lumbar spine MRI 05/06/2012 FINDINGS: Segmentation: For the purposes of this dictation, five lumbar vertebrae are assumed and the caudal most well-formed intervertebral disc is designated L5-S1. Alignment: Straightening of the expected lumbar lordosis. Trace retrolisthesis at L1-L2 and L2-L3. Vertebrae: Vertebral body height is maintained. Degenerative endplate marrow edema at L3-L4. Conus medullaris and cauda equina: Conus extends to the L1-L2 level. No signal abnormality within the visualized distal spinal cord. Paraspinal and other soft tissues: Left renal scarring. Atrophy of the lumbar paraspinal musculature. Disc levels: Unless otherwise stated, the level by level findings below have not significantly changed since prior MRI 05/06/2012. Multilevel disc degeneration has progressed from prior MRI, now greatest at L3-L4 (moderate/severe) as well as L1-L2 and L2-L3 (moderate). T12-L1: No disc herniation. No significant canal or foraminal stenosis. L1-L2: Minimal disc bulge. No significant spinal canal or neural foraminal narrowing. L2-L3: Small disc bulge. Mild facet/ligamentum flavum hypertrophy. Mild relative narrowing of the spinal canal without nerve root impingement. No significant neural foraminal narrowing. L3-L4: Disc bulge with endplate spurring. Superimposed tiny left foraminal disc protrusion, new from prior exam. Facet arthrosis/ligamentum flavum hypertrophy. Left greater than right subarticular stenosis with slight crowding of the descending left L4 nerve root. Mild central canal and left neural foraminal narrowing. Spinal canal and neural foraminal narrowing have progressed from prior MRI. L4-L5: Disc bulge. Moderate facet arthrosis with prominent ligamentum flavum hypertrophy. New from prior exam there is a ventrally projecting synovial facet cyst on the right which measures 0.7 x 0.8 cm (series 8, image 27) (series 5, image 8). The synovial facet cyst contributes to right subarticular stenosis,  contacting and displacing multiple descending right-sided nerve roots. Moderate central canal stenosis, also significantly progressed. No significant neural foraminal narrowing. L5-S1: Moderate facet arthrosis. No disc herniation. No significant canal or foraminal stenosis. IMPRESSION: Lumbar spondylosis, progressed at several levels since MRI  05/06/2012 and most notably as follows. At L4-L5, disc bulge, moderate facet arthrosis, prominent ligamentum flavum hypertrophy and new 0.8 cm ventrally projecting right-sided synovial facet cyst. The synovial facet cyst contributes to right subarticular stenosis contacting and displacing multiple descending right-sided nerve roots. Moderate central canal stenosis at this level. At L3-L4, multifactorial left subarticular stenosis with slight crowding of the descending left L4 nerve root. Mild central canal and left neural foraminal narrowing. Moderate/severe disc height loss with degenerative endplate edema. No more than mild spinal canal narrowing at the remaining levels. Electronically Signed   By: Kellie Simmering DO   On: 08/22/2019 15:11   NM Pulmonary Perfusion  Result Date: 10/24/2019 CLINICAL DATA:  Shortness of breath and chest pain.  Recent fall EXAM: NUCLEAR MEDICINE PERFUSION LUNG SCAN TECHNIQUE: Perfusion images were obtained in multiple projections after intravenous injection of radiopharmaceutical. Ventilation scans intentionally deferred if perfusion scan and chest x-ray adequate for interpretation during COVID 19 epidemic. Views: Anterior, posterior, left lateral, right lateral, RPO, LPO, RAO, LAO RADIOPHARMACEUTICALS:  4.543 mCi Tc-43mMAA IV COMPARISON:  Chest radiograph October 24, 2019 FINDINGS: Radiotracer uptake is homogeneous and symmetric bilaterally. No perfusion defects evident. IMPRESSION: No perfusion defects evident. Very low probability of pulmonary embolus. Electronically Signed   By: WLowella GripIII M.D.   On: 10/24/2019 14:50    ECHOCARDIOGRAM COMPLETE  Result Date: 10/30/2019    ECHOCARDIOGRAM REPORT   Patient Name:   Nicole Sims Date of Exam: 10/30/2019 Medical Rec #:  [id number]     Height:       65.0 in Accession #:    [id number]    Weight:       236.0 lb Date of Birth:  9December 12, 1952      BSA:          2.122 m Patient Age:    89years       BP:           120/74 mmHg Patient Gender: F              HR:           69 bpm. Exam Location:  [facility] Procedure: 2D Echo, Cardiac Doppler and Color Doppler Indications:     Dyspnea 786.09  History:         Patient has prior history of Echocardiogram examinations, most                  recent 06/25/2019. Signs/Symptoms:Murmur; Risk                  Factors:Hypertension and Sleep Apnea. Palpitations.  Sonographer:     JSherrie SportRDCS (AE) Referring Phys:  [id number] CARMEN LVeda CanningDiagnosing Phys: CNelva BushMD IMPRESSIONS  1. Left ventricular ejection fraction, by estimation, is 60 to 65%. The left ventricle has normal function. The left ventricle has no regional Gilham motion abnormalities. There is mild left ventricular hypertrophy. Left ventricular diastolic parameters are consistent with Grade I diastolic dysfunction (impaired relaxation).  2. Right ventricular systolic function is normal. The right ventricular size is normal. Tricuspid regurgitation signal is inadequate for assessing PA pressure.  3. The mitral valve is normal in structure. Trivial mitral valve regurgitation.  4. The aortic valve was not well visualized. Aortic valve regurgitation is not visualized. No aortic stenosis is present. FINDINGS  Left Ventricle: Left ventricular ejection fraction, by estimation, is 60 to 65%. The left ventricle has normal function. The left ventricle has  no regional Tessmer motion abnormalities. The left ventricular internal cavity size was normal in size. There is  mild left ventricular hypertrophy. Left ventricular diastolic parameters are consistent with Grade I diastolic dysfunction (impaired  relaxation). Right Ventricle: The right ventricular size is normal. No increase in right ventricular Roderick thickness. Right ventricular systolic function is normal. Tricuspid regurgitation signal is inadequate for assessing PA pressure. Left Atrium: Left atrial size was normal in size. Right Atrium: Right atrial size was normal in size. Pericardium: The pericardium was not well visualized. Mitral Valve: The mitral valve is normal in structure. Trivial mitral valve regurgitation. Tricuspid Valve: The tricuspid valve is not well visualized. Tricuspid valve regurgitation is trivial. Aortic Valve: The aortic valve was not well visualized. Aortic valve regurgitation is not visualized. No aortic stenosis is present. Aortic valve mean gradient measures 3.5 mmHg. Aortic valve peak gradient measures 6.3 mmHg. Aortic valve area, by VTI measures 2.85 cm. Pulmonic Valve: The pulmonic valve was not well visualized. Pulmonic valve regurgitation is not visualized. No evidence of pulmonic stenosis. Aorta: The aortic root is normal in size and structure. Pulmonary Artery: The pulmonary artery is not well seen. Venous: The inferior vena cava was not well visualized. IAS/Shunts: The interatrial septum was not well visualized.  LEFT VENTRICLE PLAX 2D LVIDd:         4.44 cm  Diastology LVIDs:         2.77 cm  LV e' lateral:   10.00 cm/s LV PW:         1.18 cm  LV E/e' lateral: 7.3 LV IVS:        1.12 cm  LV e' medial:    5.77 cm/s LVOT diam:     2.00 cm  LV E/e' medial:  12.6 LV SV:         80 LV SV Index:   38 LVOT Area:     3.14 cm  RIGHT VENTRICLE RV Basal diam:  3.29 cm RV S prime:     15.30 cm/s TAPSE (M-mode): 3.6 cm LEFT ATRIUM             Index       RIGHT ATRIUM           Index LA diam:        3.50 cm 1.65 cm/m  RA Area:     17.10 cm LA Vol (A2C):   49.3 ml 23.23 ml/m RA Volume:   44.90 ml  21.16 ml/m LA Vol (A4C):   33.0 ml 15.55 ml/m LA Biplane Vol: 40.3 ml 18.99 ml/m  AORTIC VALVE                   PULMONIC VALVE AV  Area (Vmax):    2.31 cm    PV Vmax:        0.74 m/s AV Area (Vmean):   2.64 cm    PV Peak grad:   2.2 mmHg AV Area (VTI):     2.85 cm    RVOT Peak grad: 3 mmHg AV Vmax:           125.50 cm/s AV Vmean:          82.600 cm/s AV VTI:            0.279 m AV Peak Grad:      6.3 mmHg AV Mean Grad:      3.5 mmHg LVOT Vmax:         92.40 cm/s LVOT Vmean:  69.300 cm/s LVOT VTI:          0.254 m LVOT/AV VTI ratio: 0.91  AORTA Ao Root diam: 3.00 cm MITRAL VALVE MV Area (PHT): 2.44 cm    SHUNTS MV Decel Time: 311 msec    Systemic VTI:  0.25 m MV E velocity: 72.80 cm/s  Systemic Diam: 2.00 cm MV A velocity: 88.30 cm/s MV E/A ratio:  0.82 Harrell Gave End MD Electronically signed by Nelva Bush MD Signature Date/Time: 10/30/2019/11:39:48 AM    Final      ASSESSMENT & PLAN:  1. Leukocytosis, unspecified type   2. Thrombocytosis ([place])   3. SOB (shortness of breath)    #Labs are reviewed and discussed with patient. Smear results were discussed with patient.  Chronic thrombocytosis, previously underwent extensive work up including bone marrow biopsy which showed benign appearing lymphoid aggregates, significant dysplasia is not seen. Mild polytypic plasmacytosis.  Bone marrow aspirate flow cytometry no monoclonal B-cell or phenotypically aberrant T-cell population was identified. Thrombocytosis is likely reactive. She has high ANA titer and has been previously referred to rheumatology.   Leukocytosis, unknown etiology, repeat cbc to confirm persistence,   SOB, unknown etiology.  Check BCR-ABL, CRP, ESR, SPEP/light chain ration, peripheral flowcytometry, iron panel, NT proBNP.   She may continue to take multivitamin which has iron supplementation unit. Orders Placed This Encounter  Procedures  . Flow cytometry panel-leukemia/lymphoma work-up    Standing Status:   Future    Number of Occurrences:   1    Standing Expiration Date:   05/09/2021  . Multiple Myeloma Panel (SPEP&IFE w/QIG)    Standing  Status:   Future    Number of Occurrences:   1    Standing Expiration Date:   05/09/2021  . Kappa/lambda light chains    Standing Status:   Future    Number of Occurrences:   1    Standing Expiration Date:   05/09/2021  . Miscellaneous [facility] test (send-out)    Standing Status:   Future    Number of Occurrences:   1    Standing Expiration Date:   11/06/2020    Order Specific Question:   Test name / description:    Answer:   NT-proBNP [facility] 143000  . Sedimentation rate    Standing Status:   Future    Number of Occurrences:   1    Standing Expiration Date:   11/06/2020  . C-reactive protein    Standing Status:   Future    Number of Occurrences:   1    Standing Expiration Date:   11/06/2020    All questions were answered. The patient knows to call the clinic with any problems questions or concerns.  Return of visit:  2 weeks.   Earlie Server, MD, PhD

## 2019-11-08 LAB — MISC LABCORP TEST (SEND OUT): Labcorp test code: 143000

## 2019-11-08 LAB — KAPPA/LAMBDA LIGHT CHAINS
Kappa free light chain: 34.9 mg/L — ABNORMAL HIGH (ref 3.3–19.4)
Kappa, lambda light chain ratio: 0.93 (ref 0.26–1.65)
Lambda free light chains: 37.5 mg/L — ABNORMAL HIGH (ref 5.7–26.3)

## 2019-11-09 ENCOUNTER — Ambulatory Visit: Payer: Medicare Other

## 2019-11-09 LAB — COMP PANEL: LEUKEMIA/LYMPHOMA

## 2019-11-12 LAB — MULTIPLE MYELOMA PANEL, SERUM
Albumin SerPl Elph-Mcnc: 3.4 g/dL (ref 2.9–4.4)
Albumin/Glob SerPl: 1 (ref 0.7–1.7)
Alpha 1: 0.3 g/dL (ref 0.0–0.4)
Alpha2 Glob SerPl Elph-Mcnc: 0.8 g/dL (ref 0.4–1.0)
B-Globulin SerPl Elph-Mcnc: 1.2 g/dL (ref 0.7–1.3)
Gamma Glob SerPl Elph-Mcnc: 1.1 g/dL (ref 0.4–1.8)
Globulin, Total: 3.5 g/dL (ref 2.2–3.9)
IgA: 336 mg/dL (ref 87–352)
IgG (Immunoglobin G), Serum: 1126 mg/dL (ref 586–1602)
IgM (Immunoglobulin M), Srm: 66 mg/dL (ref 26–217)
Total Protein ELP: 6.9 g/dL (ref 6.0–8.5)

## 2019-11-16 ENCOUNTER — Encounter: Payer: Self-pay | Admitting: Oncology

## 2019-11-19 ENCOUNTER — Other Ambulatory Visit: Payer: Medicare Other

## 2019-11-19 ENCOUNTER — Ambulatory Visit: Payer: Medicare Other | Admitting: Oncology

## 2019-11-20 LAB — BCR-ABL1 FISH
Cells Analyzed: 200
Cells Counted: 200

## 2019-11-27 ENCOUNTER — Encounter: Payer: Self-pay | Admitting: Internal Medicine

## 2019-11-28 ENCOUNTER — Other Ambulatory Visit: Payer: Self-pay

## 2019-11-28 MED ORDER — METOPROLOL SUCCINATE ER 100 MG PO TB24: 100.0000 mg | ORAL_TABLET | Freq: Every day | ORAL | 2 refills | Status: DC

## 2019-11-28 NOTE — Telephone Encounter (Signed)
*  STAT* If patient is at the pharmacy, call can be transferred to refill team.   1. Which medications need to be refilled? (please list name of each medication and dose if known) Metoprolol  2. Which pharmacy/location (including street and city if local pharmacy) is medication to be sent to?[facility]. Do they need a 30 day or 90 day supply? [place]

## 2019-12-04 ENCOUNTER — Other Ambulatory Visit: Payer: Self-pay

## 2019-12-04 ENCOUNTER — Ambulatory Visit (INDEPENDENT_AMBULATORY_CARE_PROVIDER_SITE_OTHER): Payer: Medicare Other | Admitting: Internal Medicine

## 2019-12-04 ENCOUNTER — Encounter: Payer: Self-pay | Admitting: Internal Medicine

## 2019-12-04 DIAGNOSIS — G4733 Obstructive sleep apnea (adult) (pediatric): Secondary | ICD-10-CM

## 2019-12-04 DIAGNOSIS — D75839 Thrombocytosis, unspecified: Secondary | ICD-10-CM

## 2019-12-04 DIAGNOSIS — K219 Gastro-esophageal reflux disease without esophagitis: Secondary | ICD-10-CM

## 2019-12-04 DIAGNOSIS — R29898 Other symptoms and signs involving the musculoskeletal system: Secondary | ICD-10-CM

## 2019-12-04 DIAGNOSIS — D649 Anemia, unspecified: Secondary | ICD-10-CM | POA: Diagnosis not present

## 2019-12-04 DIAGNOSIS — M2550 Pain in unspecified joint: Secondary | ICD-10-CM

## 2019-12-04 DIAGNOSIS — R05 Cough: Secondary | ICD-10-CM

## 2019-12-04 DIAGNOSIS — F439 Reaction to severe stress, unspecified: Secondary | ICD-10-CM

## 2019-12-04 DIAGNOSIS — R0602 Shortness of breath: Secondary | ICD-10-CM

## 2019-12-04 DIAGNOSIS — R059 Cough, unspecified: Secondary | ICD-10-CM

## 2019-12-04 DIAGNOSIS — D473 Essential (hemorrhagic) thrombocythemia: Secondary | ICD-10-CM

## 2019-12-04 DIAGNOSIS — E782 Mixed hyperlipidemia: Secondary | ICD-10-CM | POA: Diagnosis not present

## 2019-12-04 DIAGNOSIS — I10 Essential (primary) hypertension: Secondary | ICD-10-CM | POA: Diagnosis not present

## 2019-12-04 DIAGNOSIS — R739 Hyperglycemia, unspecified: Secondary | ICD-10-CM

## 2019-12-04 NOTE — Progress Notes (Signed)
Patient ID: Nicole Sims, female   DOB: [birth year], 69 y.o.   MRN: [id number]   Subjective:    Patient ID: Nicole Sims, female    DOB: 1951/08/11, 69 y.o.   MRN: [id number]  HPI This visit occurred during the SARS-CoV-2 public health emergency.  Safety protocols were in place, including screening questions prior to the visit, additional usage of staff PPE, and extensive cleaning of exam room while observing appropriate contact time as indicated for disinfecting solutions.  Patient here for a scheduled follow up. She is seeing pulmonary for her sob. ECHO EF 60-65% with grade 1 diastolic dysfunction.  VQ scan - low probability of pulmonary thrombosis.  Blood work revealed  Leukocytosis and thrombocytosis. Seeing hematology.  Previously had extensive w/up including bone marrow biopsy - felt to be likely reactive.  High ANA.  Has seen rheumatology.  Having problems with left heel hurting.  Has tried support shoes.  Discussed f/u with podiatry/ortho.  Has f/u with rheum next month.  No chest pain.  Did have a fall recently.  Fell forward.  Left shoulder and left knee and left breast - aggravated with fall.  Increased pain with abduction - left shoulder s/p fall.  Limited rom. Discussed f/u with Dr Rudene Christians given persistent problems.  Also seeing neurology for parasthesia of bilateral lower extremity and weakness.  Discussions regarding referral to Dr Sharlet Salina.     Past Medical History:  Diagnosis Date  . Anemia   . Anxiety   . Asthma   . Cataract cortical, senile   . CHF (congestive heart failure) ([place])   . Chronic headaches   . Diastolic heart failure ([place])   . Diverticulosis   . Environmental allergies   . GERD (gastroesophageal reflux disease)   . Heart murmur   . Hyperglycemia   . Hyperlipidemia   . Hypertension   . Obesity   . Osteoarthritis   . Palpitations   . Restless leg syndrome   . Sleep apnea   . Thrombocytosis ([place])   . Urinary incontinence    mixed  . Venous insufficiency   .  Vitamin D deficiency    Past Surgical History:  Procedure Laterality Date  . BLADDER SURGERY     x2   [facility]  . BREAST CYST ASPIRATION Bilateral 2005   approximate year  . CARDIAC CATHETERIZATION     [facility] Massed  . CERVICAL CONE BIOPSY     [facility]  . CHOLECYSTECTOMY    . COLONOSCOPY WITH PROPOFOL N/A 07/19/2016   Procedure: COLONOSCOPY WITH PROPOFOL;  Surgeon: Manya Silvas, MD;  Location: [facility] ENDOSCOPY;  Service: Endoscopy;  Laterality: N/A;  . COLONOSCOPY WITH PROPOFOL N/A 10/20/2018   Procedure: COLONOSCOPY WITH PROPOFOL;  Surgeon: Lollie Sails, MD;  Location: [facility] ENDOSCOPY;  Service: Endoscopy;  Laterality: N/A;  . ESOPHAGOGASTRODUODENOSCOPY (EGD) WITH PROPOFOL N/A 07/19/2016   Procedure: ESOPHAGOGASTRODUODENOSCOPY (EGD) WITH PROPOFOL;  Surgeon: Manya Silvas, MD;  Location: [facility] ENDOSCOPY;  Service: Endoscopy;  Laterality: N/A;  . FLEXIBLE SIGMOIDOSCOPY    . KNEE ARTHROSCOPY  08/13/08  . knee replacement and revision     left  . RECTOCELE REPAIR    . RIGHT/LEFT HEART CATH AND CORONARY ANGIOGRAPHY Bilateral 04/10/2018   Procedure: RIGHT/LEFT HEART CATH AND CORONARY ANGIOGRAPHY;  Surgeon: Wellington Hampshire, MD;  Location: [place];  Service: Cardiovascular;  Laterality: Bilateral;  . ROTATOR CUFF REPAIR     bilateral  . SHOULDER SURGERY  11/17/05  . TONSILLECTOMY  [place]   abnormal pap and carcinoma in situ   Family History  Problem Relation Age of Onset  . Diabetes Mellitus II Father   . Thyroid disease Father   . Breast cancer Maternal Aunt   . Hypertension Mother   . Heart Problems Brother   . Leukemia Maternal Aunt   . Colon cancer Neg Hx   . Kidney cancer Neg Hx   . Bladder Cancer Neg Hx    Social History   Socioeconomic History  . Marital status: Married    Spouse name: Not on file  . Number of children: Not on file  . Years of education: Not on file  . Highest education level: Not on file    Occupational History  . Occupation: retired  Tobacco Use  . Smoking status: Former Smoker    Packs/day: 1.00    Years: 15.00    Pack years: 15.00    Quit date: 08/17/1983    Years since quitting: 36.3  . Smokeless tobacco: Never Used  Substance and Sexual Activity  . Alcohol use: Yes    Alcohol/week: 0.0 standard drinks    Comment: rarely  . Drug use: No  . Sexual activity: Not on file  Other Topics Concern  . Not on file  Social History Narrative   Lives at home with husband   Social Determinants of Health   Financial Resource Strain:   . Difficulty of Paying Living Expenses:   Food Insecurity:   . Worried About Charity fundraiser in the Last Year:   . Arboriculturist in the Last Year:   Transportation Needs:   . Film/video editor (Medical):   Marland Kitchen Lack of Transportation (Non-Medical):   Physical Activity:   . Days of Exercise per Week:   . Minutes of Exercise per Session:   Stress:   . Feeling of Stress :   Social Connections:   . Frequency of Communication with Friends and Family:   . Frequency of Social Gatherings with Friends and Family:   . Attends Religious Services:   . Active Member of Clubs or Organizations:   . Attends Archivist Meetings:   Marland Kitchen Marital Status:     Outpatient Encounter Medications as of 12/04/2019  Medication Sig  . albuterol (VENTOLIN HFA) 108 (90 Base) MCG/ACT inhaler Inhale 2 puffs into the lungs every 6 (six) hours as needed for wheezing or shortness of breath.  Marland Kitchen aspirin EC 325 MG tablet Take 325 mg by mouth daily.   . budesonide-formoterol (SYMBICORT) 80-4.5 MCG/ACT inhaler Inhale 2 puffs into the lungs 2 (two) times daily.  . Calcium Carb-Cholecalciferol (CALCIUM 500 +D) 500-400 MG-UNIT TABS Take 1 tablet by mouth daily.  . Cholecalciferol (VITAMIN D-3) 1000 units CAPS Take 1,000 Units by mouth daily.   . fluticasone (FLONASE) 50 MCG/ACT nasal spray Place 2 sprays into both nostrils daily.  Marland Kitchen gabapentin (NEURONTIN) 300  MG capsule Take 1 capsule (300 mg total) by mouth at bedtime as needed. (Patient taking differently: Take 300-600 mg by mouth at bedtime as needed. )  . magnesium oxide (MAG-OX) 400 MG tablet Take 400 mg daily by mouth.  . metoprolol succinate (TOPROL-XL) 100 MG 24 hr tablet Take 1 tablet (100 mg total) by mouth daily. Take with or immediately following a meal.  . montelukast (SINGULAIR) 10 MG tablet TAKE 1 TABLET BY MOUTH AT  BEDTIME (Patient taking differently: Take 10 mg by mouth at bedtime. )  .  Multiple Vitamin (MULTIVITAMIN) tablet Take 1 tablet by mouth daily.  . ondansetron (ZOFRAN) 4 MG tablet Take 1 tablet (4 mg total) by mouth 2 (two) times daily as needed for nausea or vomiting.  . RABEprazole (ACIPHEX) 20 MG tablet Take 20 mg by mouth 2 (two) times daily.  . rosuvastatin (CRESTOR) 10 MG tablet TAKE 1 TABLET BY MOUTH  DAILY  . sucralfate (CARAFATE) 1 g tablet Take 1 g by mouth daily as needed (GI sympotms).   . venlafaxine XR (EFFEXOR-XR) 150 MG 24 hr capsule TAKE 1 CAPSULE BY MOUTH  DAILY WITH BREAKFAST (Patient taking differently: Take 150 mg by mouth daily with breakfast. )  . vitamin B-12 (CYANOCOBALAMIN) 1000 MCG tablet Take 1,000 mcg by mouth daily.   No facility-administered encounter medications on file as of 12/04/2019.    Review of Systems  Constitutional: Positive for fatigue. Negative for appetite change and unexpected weight change.  HENT: Negative for congestion and sinus pressure.   Respiratory: Positive for shortness of breath. Negative for cough and chest tightness.   Cardiovascular: Negative for chest pain, palpitations and leg swelling.  Gastrointestinal: Negative for abdominal pain, diarrhea, nausea and vomiting.  Genitourinary: Negative for difficulty urinating and dysuria.  Musculoskeletal:       Joint pain and parasthesia as outlined.    Skin: Negative for color change and rash.  Neurological: Negative for dizziness, light-headedness and headaches.    Psychiatric/Behavioral: Negative for agitation and dysphoric mood.       Objective:    Physical Exam Vitals reviewed.  Constitutional:      General: She is not in acute distress.    Appearance: Normal appearance.  HENT:     Head: Normocephalic and atraumatic.     Right Ear: External ear normal.     Left Ear: External ear normal.  Eyes:     General:        Right eye: No discharge.        Left eye: No discharge.     Conjunctiva/sclera: Conjunctivae normal.  Neck:     Thyroid: No thyromegaly.  Cardiovascular:     Rate and Rhythm: Normal rate and regular rhythm.  Pulmonary:     Effort: No respiratory distress.     Breath sounds: Normal breath sounds. No wheezing.  Abdominal:     General: Bowel sounds are normal.     Palpations: Abdomen is soft.     Tenderness: There is no abdominal tenderness.  Musculoskeletal:        General: No swelling or tenderness.     Cervical back: Neck supple. No tenderness.  Lymphadenopathy:     Cervical: No cervical adenopathy.  Skin:    Findings: No erythema or rash.  Neurological:     Mental Status: She is alert.  Psychiatric:        Mood and Affect: Mood normal.        Behavior: Behavior normal.     BP 132/72   Pulse 72   Temp 97.8 F (36.6 C)   Resp 16   Ht '5\' 5"'$  (1.651 m)   Wt 241 lb 12.8 oz (109.7 kg)   SpO2 97%   BMI 40.24 kg/m  Wt Readings from Last 3 Encounters:  12/04/19 241 lb 12.8 oz (109.7 kg)  11/07/19 236 lb (107 kg)  11/02/19 237 lb (107.5 kg)     Lab Results  Component Value Date   WBC 8.1 11/07/2019   HGB 12.7 11/07/2019   HCT 39.4 11/07/2019  PLT 423 (H) 11/07/2019   GLUCOSE 92 10/24/2019   CHOL 191 06/26/2019   TRIG 152 (H) 06/26/2019   HDL 56 06/26/2019   LDLDIRECT 172.8 09/17/2013   LDLCALC 105 (H) 06/26/2019   ALT 16 10/24/2019   AST 11 (L) 10/24/2019   NA 141 10/24/2019   K 3.9 10/24/2019   CL 102 10/24/2019   CREATININE 1.03 (H) 10/24/2019   BUN 20 10/24/2019   CO2 26 10/24/2019   TSH  2.008 10/24/2019   INR 1.0 06/11/2019   HGBA1C 6.0 (H) 06/26/2019   MICROALBUR 1.1 04/28/2015       Assessment & Plan:   Problem List Items Addressed This Visit    Anemia    Seeing hematology.  They are following cbc.       Combined hyperlipidemia    On crestor.  Low cholesterol diet and exercise.  Follow lipid panel and liver function tests.        Cough    Seeing Dr Patsey Berthold for further w/up.        Essential hypertension    Blood pressure under good control.  Continue same medication regimen - toprol.   Follow pressures.  Follow metabolic panel.        Gastroesophageal reflux disease    Continue aciphex.        Hyperglycemia    Low carb diet and exercise.  Follow met b and a1c.       Joint pain    Has varying joint pains as outlined.  Positive ANA.  Was referred to rheumatology.  Hs f/u planned next month.        Relevant Orders   Ambulatory referral to Orthopedic Surgery   Lower extremity weakness    Being followed by rheumatology and neurology.  MRI lumbar spine as outlined previously.  Worsening degenerative joint disease.  Being referred to Dr Sharlet Salina.        OSA (obstructive sleep apnea)    CPAP.       SOB (shortness of breath)    Seeing pulmonary.  Undergoing w/up.  VQ - low probability.  ECHO as outlined.  Continue pulmonary f/u and w/up/        Stress    Increased stress.  Appears to be handling things relatively well.  Follow.       Thrombocytosis ([place])    Seeing hematology.  Felt to be reactive.  Follow cbc.           Einar Pheasant, MD

## 2019-12-09 ENCOUNTER — Encounter: Payer: Self-pay | Admitting: Internal Medicine

## 2019-12-09 NOTE — Assessment & Plan Note (Signed)
Blood pressure under good control.  Continue same medication regimen - toprol.   Follow pressures.  Follow metabolic panel.

## 2019-12-09 NOTE — Assessment & Plan Note (Signed)
Low carb diet and exercise.  Follow met b and a1c.  

## 2019-12-09 NOTE — Assessment & Plan Note (Signed)
Being followed by rheumatology and neurology.  MRI lumbar spine as outlined previously.  Worsening degenerative joint disease.  Being referred to Dr Sharlet Salina.

## 2019-12-09 NOTE — Assessment & Plan Note (Signed)
Increased stress. Appears to be handling things relatively well.  Follow.  

## 2019-12-09 NOTE — Assessment & Plan Note (Signed)
Seeing Dr Patsey Berthold for further w/up.

## 2019-12-09 NOTE — Assessment & Plan Note (Signed)
Seeing pulmonary.  Undergoing w/up.  VQ - low probability.  ECHO as outlined.  Continue pulmonary f/u and w/up/

## 2019-12-09 NOTE — Assessment & Plan Note (Signed)
CPAP.  

## 2019-12-09 NOTE — Assessment & Plan Note (Signed)
Has varying joint pains as outlined.  Positive ANA.  Was referred to rheumatology.  Hs f/u planned next month.

## 2019-12-09 NOTE — Assessment & Plan Note (Signed)
Continue aciphex.  

## 2019-12-09 NOTE — Assessment & Plan Note (Signed)
Seeing hematology.  Felt to be reactive.  Follow cbc.   

## 2019-12-09 NOTE — Assessment & Plan Note (Signed)
On crestor.  Low cholesterol diet and exercise.  Follow lipid panel and liver function tests.   

## 2019-12-09 NOTE — Assessment & Plan Note (Signed)
Seeing hematology.  They are following cbc.

## 2019-12-12 DIAGNOSIS — R2689 Other abnormalities of gait and mobility: Secondary | ICD-10-CM | POA: Diagnosis not present

## 2019-12-13 ENCOUNTER — Ambulatory Visit: Payer: Medicare Other | Admitting: Pulmonary Disease

## 2019-12-17 DIAGNOSIS — M7732 Calcaneal spur, left foot: Secondary | ICD-10-CM | POA: Diagnosis not present

## 2019-12-17 DIAGNOSIS — M10071 Idiopathic gout, right ankle and foot: Secondary | ICD-10-CM | POA: Diagnosis not present

## 2019-12-17 DIAGNOSIS — M7662 Achilles tendinitis, left leg: Secondary | ICD-10-CM | POA: Diagnosis not present

## 2019-12-17 DIAGNOSIS — M25475 Effusion, left foot: Secondary | ICD-10-CM | POA: Diagnosis not present

## 2019-12-17 DIAGNOSIS — M25474 Effusion, right foot: Secondary | ICD-10-CM | POA: Diagnosis not present

## 2019-12-31 DIAGNOSIS — M7732 Calcaneal spur, left foot: Secondary | ICD-10-CM | POA: Diagnosis not present

## 2019-12-31 DIAGNOSIS — M10071 Idiopathic gout, right ankle and foot: Secondary | ICD-10-CM | POA: Diagnosis not present

## 2019-12-31 DIAGNOSIS — M7662 Achilles tendinitis, left leg: Secondary | ICD-10-CM | POA: Diagnosis not present

## 2020-01-07 DIAGNOSIS — M25512 Pain in left shoulder: Secondary | ICD-10-CM | POA: Diagnosis not present

## 2020-01-07 DIAGNOSIS — M7582 Other shoulder lesions, left shoulder: Secondary | ICD-10-CM | POA: Diagnosis not present

## 2020-01-07 DIAGNOSIS — G8929 Other chronic pain: Secondary | ICD-10-CM | POA: Diagnosis not present

## 2020-01-07 DIAGNOSIS — M7542 Impingement syndrome of left shoulder: Secondary | ICD-10-CM | POA: Diagnosis not present

## 2020-02-15 ENCOUNTER — Ambulatory Visit (INDEPENDENT_AMBULATORY_CARE_PROVIDER_SITE_OTHER): Payer: Medicare Other | Admitting: Internal Medicine

## 2020-02-15 ENCOUNTER — Other Ambulatory Visit (HOSPITAL_COMMUNITY)
Admission: RE | Admit: 2020-02-15 | Discharge: 2020-02-15 | Disposition: A | Discharge status: Home / Self Care | Payer: Medicare Other | Source: Ambulatory Visit | Attending: Internal Medicine | Admitting: Internal Medicine

## 2020-02-15 ENCOUNTER — Other Ambulatory Visit: Payer: Self-pay

## 2020-02-15 ENCOUNTER — Encounter: Payer: Self-pay | Admitting: Internal Medicine

## 2020-02-15 ENCOUNTER — Ambulatory Visit (INDEPENDENT_AMBULATORY_CARE_PROVIDER_SITE_OTHER): Payer: Medicare Other

## 2020-02-15 VITALS — BP 112/70 | HR 72 | Temp 97.8°F | Resp 16 | Ht 65.0 in | Wt 240.0 lb

## 2020-02-15 DIAGNOSIS — Z1151 Encounter for screening for human papillomavirus (HPV): Secondary | ICD-10-CM | POA: Diagnosis not present

## 2020-02-15 DIAGNOSIS — G4733 Obstructive sleep apnea (adult) (pediatric): Secondary | ICD-10-CM

## 2020-02-15 DIAGNOSIS — D473 Essential (hemorrhagic) thrombocythemia: Secondary | ICD-10-CM | POA: Diagnosis not present

## 2020-02-15 DIAGNOSIS — R0789 Other chest pain: Secondary | ICD-10-CM | POA: Insufficient documentation

## 2020-02-15 DIAGNOSIS — Z0001 Encounter for general adult medical examination with abnormal findings: Secondary | ICD-10-CM | POA: Diagnosis not present

## 2020-02-15 DIAGNOSIS — R0981 Nasal congestion: Secondary | ICD-10-CM

## 2020-02-15 DIAGNOSIS — E7800 Pure hypercholesterolemia, unspecified: Secondary | ICD-10-CM

## 2020-02-15 DIAGNOSIS — R0781 Pleurodynia: Secondary | ICD-10-CM

## 2020-02-15 DIAGNOSIS — F439 Reaction to severe stress, unspecified: Secondary | ICD-10-CM | POA: Diagnosis not present

## 2020-02-15 DIAGNOSIS — Z124 Encounter for screening for malignant neoplasm of cervix: Secondary | ICD-10-CM | POA: Diagnosis not present

## 2020-02-15 DIAGNOSIS — R739 Hyperglycemia, unspecified: Secondary | ICD-10-CM

## 2020-02-15 DIAGNOSIS — Z1231 Encounter for screening mammogram for malignant neoplasm of breast: Secondary | ICD-10-CM

## 2020-02-15 DIAGNOSIS — L900 Lichen sclerosus et atrophicus: Secondary | ICD-10-CM

## 2020-02-15 DIAGNOSIS — D75839 Thrombocytosis, unspecified: Secondary | ICD-10-CM

## 2020-02-15 DIAGNOSIS — Z0000 Encounter for general adult medical examination without abnormal findings: Secondary | ICD-10-CM

## 2020-02-15 NOTE — Progress Notes (Signed)
Patient ID: Nicole Sims, female   DOB: 1951/01/01, 69 y.o.   MRN: [id number]   Subjective:    Patient ID: Nicole Sims, female    DOB: 11-18-1950, 69 y.o.   MRN: [id number]  HPI This visit occurred during the SARS-CoV-2 public health emergency.  Safety protocols were in place, including screening questions prior to the visit, additional usage of staff PPE, and extensive cleaning of exam room while observing appropriate contact time as indicated for disinfecting solutions.  Patient here for her physical exam.  She reports she is doing some better.  Has paraesthesia of bilateral lower extremity and weakness in bilateral lower extremity.  Recommended referral to physical therapy.  Gabapentin dose decreased to '300mg'$  q day.  Recommended f/u NCS. Not scheduled yet.  She is going to call and f/u on scheduling.  Also has intracranial stenosis of right vertebral artery.  Recommend continuing aspirin and crestor.  Discussed with her today.  Continues on cpap.  Recently evaluated by podiatry for question of gout - causing pain right ankle.  Recommended heel lifts.  Also saw ortho for left shoulder pain.  S/p injection.  Helped.  Reports some previous increased congestion.  Feeling better now.  Used neb and inhaler.  Taking singulair and using nasal spray - astelin.  Stable now.  Blood pressure doing well.  Eating.  No nausea or vomiting.  No abdominal pain reported.  Bowels stable.  Does report pain - right and left lower anterior ribs.  Describes hip pain as well.  Increased stress.  Discussed with her today.    Past Medical History:  Diagnosis Date  . Anemia   . Anxiety   . Asthma   . Cataract cortical, senile   . CHF (congestive heart failure) ([place])   . Chronic headaches   . Diastolic heart failure ([place])   . Diverticulosis   . Environmental allergies   . GERD (gastroesophageal reflux disease)   . Heart murmur   . Hyperglycemia   . Hyperlipidemia   . Hypertension   . Obesity   . Osteoarthritis     . Palpitations   . Restless leg syndrome   . Sleep apnea   . Thrombocytosis ([place])   . Urinary incontinence    mixed  . Venous insufficiency   . Vitamin D deficiency    Past Surgical History:  Procedure Laterality Date  . BLADDER SURGERY     x2   [facility]  . BREAST CYST ASPIRATION Bilateral 2005   approximate year  . CARDIAC CATHETERIZATION     [facility]hem[facility] Massed  . CERVICAL CONE BIOPSY     [facility]  . CHOLECYSTECTOMY    . COLONOSCOPY WITH PROPOFOL N/A 07/19/2016   Procedure: COLONOSCOPY WITH PROPOFOL;  Surgeon: Manya Silvas, MD;  Location[facility] ENDOSCOPY;  Service: Endoscopy;  Laterality: N/A;  . COLONOSCOPY WITH PROPOFOL N/A 10/20/2018   Procedure: COLONOSCOPY WITH PROPOFOL;  Surgeon: Lollie Sails, MD;  Location: [facility] ENDOSCOPY;  Service: Endoscopy;  Laterality: N/A;  . ESOPHAGOGASTRODUODENOSCOPY (EGD) WITH PROPOFOL N/A 07/19/2016   Procedure: ESOPHAGOGASTRODUODENOSCOPY (EGD) WITH PROPOFOL;  Surgeon: Manya Silvas, MD;  Location: [facility] ENDOSCOPY;  Service: Endoscopy;  Laterality: N/A;  . FLEXIBLE SIGMOIDOSCOPY    . KNEE ARTHROSCOPY  08/13/08  . knee replacement and revision     left  . RECTOCELE REPAIR    . RIGHT/LEFT HEART CATH AND CORONARY ANGIOGRAPHY Bilateral 04/10/2018   Procedure: RIGHT/LEFT HEART CATH AND CORONARY ANGIOGRAPHY;  Surgeon: Wellington Hampshire, MD;  Location: [facility];  Service: Cardiovascular;  Laterality: Bilateral;  . ROTATOR CUFF REPAIR     bilateral  . SHOULDER SURGERY  11/17/05  . TONSILLECTOMY  1962  . VAGINAL HYSTERECTOMY  1974   abnormal pap and carcinoma in situ   Family History  Problem Relation Age of Onset  . Diabetes Mellitus II Father   . Thyroid disease Father   . Breast cancer Maternal Aunt   . Hypertension Mother   . Heart Problems Brother   . Leukemia Maternal Aunt   . Colon cancer Neg Hx   . Kidney cancer Neg Hx   . Bladder Cancer Neg Hx    Social History   Socioeconomic History  . Marital status: Married     Spouse name: Not on file  . Number of children: Not on file  . Years of education: Not on file  . Highest education level: Not on file  Occupational History  . Occupation: retired  Tobacco Use  . Smoking status: Former Smoker    Packs/day: 1.00    Years: 15.00    Pack years: 15.00    Quit date: 08/17/1983    Years since quitting: 36.5  . Smokeless tobacco: Never Used  Vaping Use  . Vaping Use: Never used  Substance and Sexual Activity  . Alcohol use: Yes    Alcohol/week: 0.0 standard drinks    Comment: rarely  . Drug use: No  . Sexual activity: Not on file  Other Topics Concern  . Not on file  Social History Narrative   Lives at home with husband   Social Determinants of Health   Financial Resource Strain:   . Difficulty of Paying Living Expenses:   Food Insecurity:   . Worried About Charity fundraiser in the Last Year:   . Arboriculturist in the Last Year:   Transportation Needs:   . Film/video editor (Medical):   Marland Kitchen Lack of Transportation (Non-Medical):   Physical Activity:   . Days of Exercise per Week:   . Minutes of Exercise per Session:   Stress:   . Feeling of Stress :   Social Connections:   . Frequency of Communication with Friends and Family:   . Frequency of Social Gatherings with Friends and Family:   . Attends Religious Services:   . Active Member of Clubs or Organizations:   . Attends Archivist Meetings:   Marland Kitchen Marital Status:     Outpatient Encounter Medications as of 02/15/2020  Medication Sig  . albuterol (VENTOLIN HFA) 108 (90 Base) MCG/ACT inhaler Inhale 2 puffs into the lungs every 6 (six) hours as needed for wheezing or shortness of breath.  Marland Kitchen aspirin EC 325 MG tablet Take 325 mg by mouth daily.   . budesonide-formoterol (SYMBICORT) 80-4.5 MCG/ACT inhaler Inhale 2 puffs into the lungs 2 (two) times daily.  . Calcium Carb-Cholecalciferol (CALCIUM 500 +D) 500-400 MG-UNIT TABS Take 1 tablet by mouth daily.  . Cholecalciferol  (VITAMIN D-3) 1000 units CAPS Take 1,000 Units by mouth daily.   . fluticasone (FLONASE) 50 MCG/ACT nasal spray Place 2 sprays into both nostrils daily.  Marland Kitchen gabapentin (NEURONTIN) 300 MG capsule Take 1 capsule (300 mg total) by mouth at bedtime as needed. (Patient taking differently: Take 300-600 mg by mouth at bedtime as needed. )  . magnesium oxide (MAG-OX) 400 MG tablet Take 400 mg daily by mouth.  . metoprolol succinate (TOPROL-XL) 100 MG 24 hr tablet Take 1 tablet (  100 mg total) by mouth daily. Take with or immediately following a meal.  . montelukast (SINGULAIR) 10 MG tablet TAKE 1 TABLET BY MOUTH AT  BEDTIME (Patient taking differently: Take 10 mg by mouth at bedtime. )  . Multiple Vitamin (MULTIVITAMIN) tablet Take 1 tablet by mouth daily.  . ondansetron (ZOFRAN) 4 MG tablet Take 1 tablet (4 mg total) by mouth 2 (two) times daily as needed for nausea or vomiting.  . RABEprazole (ACIPHEX) 20 MG tablet Take 20 mg by mouth 2 (two) times daily.  . rosuvastatin (CRESTOR) 10 MG tablet TAKE 1 TABLET BY MOUTH  DAILY  . sucralfate (CARAFATE) 1 g tablet Take 1 g by mouth daily as needed (GI sympotms).   . venlafaxine XR (EFFEXOR-XR) 150 MG 24 hr capsule TAKE 1 CAPSULE BY MOUTH  DAILY WITH BREAKFAST (Patient taking differently: Take 150 mg by mouth daily with breakfast. )  . vitamin B-12 (CYANOCOBALAMIN) 1000 MCG tablet Take 1,000 mcg by mouth daily.   No facility-administered encounter medications on file as of 02/15/2020.   Review of Systems  Constitutional: Negative for appetite change and unexpected weight change.  HENT: Positive for congestion. Negative for sinus pressure and sore throat.   Eyes: Negative for pain and visual disturbance.  Respiratory: Negative for cough, chest tightness and shortness of breath.   Cardiovascular: Negative for chest pain, palpitations and leg swelling.       Reports pain to palpation over right and left lower anterior ribs.    Gastrointestinal: Negative for  abdominal pain, diarrhea, nausea and vomiting.  Genitourinary: Negative for difficulty urinating and dysuria.  Musculoskeletal: Negative for joint swelling and myalgias.       Rib pain as outlined.  Also discussed right knee pain.  Hip pain.    Skin: Negative for color change and rash.  Neurological: Negative for dizziness, light-headedness and headaches.  Hematological: Negative for adenopathy. Does not bruise/bleed easily.  Psychiatric/Behavioral: Negative for agitation and dysphoric mood.       Increased stress as outlined.        Objective:    Physical Exam Vitals reviewed.  Constitutional:      General: She is not in acute distress.    Appearance: Normal appearance. She is well-developed.  HENT:     Head: Normocephalic and atraumatic.     Right Ear: External ear normal.     Left Ear: External ear normal.  Eyes:     General: No scleral icterus.       Right eye: No discharge.        Left eye: No discharge.     Conjunctiva/sclera: Conjunctivae normal.  Neck:     Thyroid: No thyromegaly.  Cardiovascular:     Rate and Rhythm: Normal rate and regular rhythm.  Pulmonary:     Effort: No tachypnea, accessory muscle usage or respiratory distress.     Breath sounds: Normal breath sounds. No decreased breath sounds or wheezing.     Comments: Chest Tull - pain to palpation over right and left lower anterior ribs.   Chest:     Breasts:        Right: No inverted nipple, mass, nipple discharge or tenderness (no axillary adenopathy).        Left: No inverted nipple, mass, nipple discharge or tenderness (no axilarry adenopathy).  Abdominal:     General: Bowel sounds are normal.     Palpations: Abdomen is soft.     Tenderness: There is no abdominal tenderness.  Genitourinary:  Comments: Normal external genitalia.  Vaginal vault without lesions.  S/p hysterectoimy.  Pap smear performed.  Could not appreciate any adnexal masses or tenderness.   Musculoskeletal:        General: No  swelling or tenderness.     Cervical back: Neck supple. No tenderness.  Lymphadenopathy:     Cervical: No cervical adenopathy.  Skin:    Findings: No erythema or rash.  Neurological:     Mental Status: She is alert and oriented to person, place, and time.  Psychiatric:        Mood and Affect: Mood normal.        Behavior: Behavior normal.     BP 112/70   Pulse 72   Temp 97.8 F (36.6 C)   Resp 16   Ht '5\' 5"'$  (1.651 m)   Wt 240 lb (108.9 kg)   SpO2 97%   BMI 39.94 kg/m  Wt Readings from Last 3 Encounters:  02/15/20 240 lb (108.9 kg)  12/04/19 241 lb 12.8 oz (109.7 kg)  11/07/19 236 lb (107 kg)     Lab Results  Component Value Date   WBC 8.1 11/07/2019   HGB 12.7 11/07/2019   HCT 39.4 11/07/2019   PLT 423 (H) 11/07/2019   GLUCOSE 92 10/24/2019   CHOL 191 06/26/2019   TRIG 152 (H) 06/26/2019   HDL 56 06/26/2019   LDLDIRECT 172.8 09/17/2013   LDLCALC 105 (H) 06/26/2019   ALT 16 10/24/2019   AST 11 (L) 10/24/2019   NA 141 10/24/2019   K 3.9 10/24/2019   CL 102 10/24/2019   CREATININE 1.03 (H) 10/24/2019   BUN 20 10/24/2019   CO2 26 10/24/2019   TSH 2.008 10/24/2019   INR 1.0 06/11/2019   HGBA1C 6.0 (H) 06/26/2019   MICROALBUR 1.1 04/28/2015       Assessment & Plan:   Problem List Items Addressed This Visit    Health care maintenance    Physical today 02/15/20.  Colonoscopy 10/20/2018.  Recommended f/u in 5 years.  PAP today 02/15/20.  Bilateral mammogram and ultrasound 02/22/19 - Birads II.  F/u mammogram ordered.        Hypercholesteremia    On crestor.  Low cholesterol diet and exercise.  Follow lipid panel and liver function tests.        Relevant Orders   Hepatic function panel   Lipid panel   Hyperglycemia    Low carb diet and exercise.  Follow met b and a1c.       Relevant Orders   Hemoglobin J2E   Basic metabolic panel   Lichen sclerosus    Recommend f/u with gyn.  Pelvic/pap today.        Relevant Orders   Ambulatory referral to Gynecology    Nasal congestion    Better now.  Continue singulair and astelin.        OSA (obstructive sleep apnea)    CPAP.       Rib pain    Bilateral lower right and left anterior ribs. Pain to palpation.  Discussed further w/up.  Check xray.  Also with hip pain and "bone pain".  Follow.  Further w/up pending results of xray.        Relevant Orders   DG Ribs Bilateral W/Chest (Completed)   Stress    Increased stress as outlined.  Follow.        Thrombocytosis ([place])    Seeing hematology.  Felt to be reactive.  Follow cbc.  Other Visit Diagnoses    Visit for screening mammogram       Relevant Orders   MM 3D SCREEN BREAST BILATERAL   Cervical cancer screening       Relevant Orders   Cytology - PAP( [place])       Einar Pheasant, MD

## 2020-02-17 ENCOUNTER — Encounter: Payer: Self-pay | Admitting: Internal Medicine

## 2020-02-17 DIAGNOSIS — E7800 Pure hypercholesterolemia, unspecified: Secondary | ICD-10-CM | POA: Insufficient documentation

## 2020-02-17 NOTE — Assessment & Plan Note (Signed)
Low carb diet and exercise.  Follow met b and a1c.  

## 2020-02-17 NOTE — Assessment & Plan Note (Signed)
Better now.  Continue singulair and astelin.

## 2020-02-17 NOTE — Assessment & Plan Note (Signed)
Bilateral lower right and left anterior ribs. Pain to palpation.  Discussed further w/up.  Check xray.  Also with hip pain and "bone pain".  Follow.  Further w/up pending results of xray.

## 2020-02-17 NOTE — Assessment & Plan Note (Signed)
Recommend f/u with gyn.  Pelvic/pap today.

## 2020-02-17 NOTE — Assessment & Plan Note (Signed)
On crestor.  Low cholesterol diet and exercise.  Follow lipid panel and liver function tests.   

## 2020-02-17 NOTE — Assessment & Plan Note (Signed)
Increased stress as outlined.  Follow.   

## 2020-02-17 NOTE — Assessment & Plan Note (Signed)
CPAP.  

## 2020-02-17 NOTE — Assessment & Plan Note (Signed)
Seeing hematology.  Felt to be reactive.  Follow cbc.

## 2020-02-17 NOTE — Assessment & Plan Note (Signed)
Physical today 02/15/20.  Colonoscopy 10/20/2018.  Recommended f/u in 5 years.  PAP today 02/15/20.  Bilateral mammogram and ultrasound 02/22/19 - Birads II.  F/u mammogram ordered.

## 2020-02-19 LAB — CYTOLOGY - PAP
Comment: NEGATIVE
Diagnosis: NEGATIVE
High risk HPV: NEGATIVE

## 2020-02-21 ENCOUNTER — Other Ambulatory Visit: Payer: Self-pay | Admitting: Physician Assistant

## 2020-02-25 DIAGNOSIS — M48061 Spinal stenosis, lumbar region without neurogenic claudication: Secondary | ICD-10-CM | POA: Diagnosis not present

## 2020-02-25 DIAGNOSIS — M5416 Radiculopathy, lumbar region: Secondary | ICD-10-CM | POA: Diagnosis not present

## 2020-02-25 DIAGNOSIS — M5136 Other intervertebral disc degeneration, lumbar region: Secondary | ICD-10-CM | POA: Diagnosis not present

## 2020-02-26 NOTE — Progress Notes (Signed)
Cardiology Office Note    Date:  03/03/2020   ID:  Nicole Sims, DOB May 27, 1951, MRN [id number]  PCP:  Einar Pheasant, MD  Cardiologist:  Kathlyn Sacramento, MD  Electrophysiologist:  None   Chief Complaint: Follow up  History of Present Illness:   Nicole Sims is a 69 y.o. female with history of diastolic dysfunction, paroxysmal SVT, HTN, HLD, anemia, sleep apnea on CPAP, and obesity who presents for follow-up of SVT.  She was evaluated in 2019 for persistent exertional dyspnea.  R/LHC in 03/2018 showed normal coronary arteries with normal LV systolic function.  RHC showed mildly elevated filling pressures with pulmonary capillary wedge pressure of 13 mmHg, no pulmonary hypertension, and normal cardiac output.  CTA of the chest showed no evidence of PE.  She subsequently was evaluated for palpitations in 01/2019 with Zio patch showing a predominant rhythm of sinus with an average heart rate of 80 bpm.  She had short runs of SVT with the longest lasting 7 beats.  Toprol was titrated to 100 mg once daily with significant improvement in symptoms at her last visit in 02/2019.  She was evaluated by pulmonology in 10/2019 with increasing shortness of breath.  Echo at that time showing an EF of 60 to 65%, no regional [place] motion abnormalities, mild LVH, grade 1 diastolic dysfunction, normal RV systolic function and RV cavity size, trivial mitral regurgitation.  VQ scan low probability for PE.  On blood work she was noted to have a leukocytosis and monocytosis intraoperative hematology with abnormalities felt to be likely reactive.  She comes in today noting a several week history of increased shortness of breath though not to the degree of which she was experiencing when she was evaluated by pulmonology in the spring.  She also notes some lower extremity swelling and weight gain of anywhere from 3 to 5 pounds intermittently overnight.  With this, she also notes some orthopnea.  She does not add salt to  foods.  She drinks a little over 2 L of fluid per day.  Palpitations are overall reasonably controlled though she has noted some palpitations within the past several weeks.  She is now on full dose aspirin at the direction of neurology.  She wonders if some of her lower extremity swelling is in the setting of an underlying inflammatory process as she indicates she aches all over.  She is followed by rheumatology for this.     Labs independently reviewed: 02/2020 - albumin 4.0, AST/ALT normal, A1c 5.9, TC 176, TG 143, HDL 55, LDL 92, potassium 4.1, BUN 13, serum creatinine 1.01 10/2019 - HGB 12.7, PLT 423, TSH normal   Past Medical History:  Diagnosis Date  . Anemia   . Anxiety   . Asthma   . Cataract cortical, senile   . CHF (congestive heart failure) ([place])   . Chronic headaches   . Diastolic heart failure ([place])   . Diverticulosis   . Environmental allergies   . GERD (gastroesophageal reflux disease)   . Heart murmur   . Hyperglycemia   . Hyperlipidemia   . Hypertension   . Obesity   . Osteoarthritis   . Palpitations   . Restless leg syndrome   . Sleep apnea   . Thrombocytosis ([place])   . Urinary incontinence    mixed  . Venous insufficiency   . Vitamin D deficiency     Past Surgical History:  Procedure Laterality Date  . BLADDER SURGERY     x2  [place] [facility]  . BREAST CYST ASPIRATION Bilateral 2005   approximate year  . CARDIAC CATHETERIZATION     Nehemiah Massed  . CERVICAL CONE BIOPSY     C[facility]  . CHOLECYSTECTOMY    . COLONOSCOPY WITH PROPOFOL N/A 07/19/2016   Procedure: COLONOSCOPY WITH PROPOFOL;  Surgeon: Manya Silvas, MD;  Location: [facility] END[facility]COPY;  Service: Endoscopy;  Laterality: N/A;  . COLONOSCOPY WITH PROPOFOL N/A 10/20/2018   Procedure: COLONOSCOPY WITH PROPOFOL;  Surgeon: Lollie Sails, MD;  Location[facility] ENDOSCOPY;  Service: Endoscopy;  Laterality: N/A;  . ESOPHAGOGASTRODUODENOSCOPY (EGD) WITH PROPOFOL N/A 07/19/2016   Procedure:  ESOPHAGOGASTRODUODENOSCOPY (EGD) WITH PROPOFOL;  Surgeon: Manya Silvas, MD;  Location: [facility] ENDOSCOPY;  Service: Endoscopy;  Laterality: N/A;  . FLEXIBLE SIGMOIDOSCOPY    . KNEE ARTHROSCOPY  08/13/08  . knee replacement and revision     left  . RECTOCELE REPAIR    . RIGHT/LEFT HEART CATH AND CORONARY ANGIOGRAPHY Bilateral 04/10/2018   Procedure: RIGHT/LEFT HEART CATH AND CORONARY ANGIOGRAPHY;  Surgeon: Wellington Hampshire, MD;  Location: [facility];  Service: Cardiovascular;  Laterality: Bilateral;  . ROTATOR CUFF REPAIR     bilateral  . SHOULDER SURGERY  11/17/05  . TONSILLECTOMY  1962  . VAGINAL HYSTERECTOMY  1974   abnormal pap and carcinoma in situ    Current Medications: Current Meds  Medication Sig  . albuterol (VENTOLIN HFA) 108 (90 Base) MCG/ACT inhaler Inhale 2 puffs into the lungs every 6 (six) hours as needed for wheezing or shortness of breath.  Marland Kitchen aspirin EC 325 MG tablet Take 325 mg by mouth daily.   . budesonide-formoterol (SYMBICORT) 80-4.5 MCG/ACT inhaler Inhale 2 puffs into the lungs 2 (two) times daily.  . Calcium Carb-Cholecalciferol (CALCIUM 500 +D) 500-400 MG-UNIT TABS Take 1 tablet by mouth daily.  . Cholecalciferol (VITAMIN D-3) 1000 units CAPS Take 1,000 Units by mouth daily.   . fluticasone (FLONASE) 50 MCG/ACT nasal spray Place 2 sprays into both nostrils daily.  Marland Kitchen gabapentin (NEURONTIN) 300 MG capsule Take 1 capsule (300 mg total) by mouth at bedtime as needed. (Patient taking differently: Take 300-600 mg by mouth at bedtime as needed. )  . magnesium oxide (MAG-OX) 400 MG tablet Take 400 mg daily by mouth.  . metoprolol succinate (TOPROL-XL) 100 MG 24 hr tablet TAKE 1 TABLET(100 MG) BY MOUTH DAILY WITH OR IMMEDIATELY FOLLOWING A MEAL  . montelukast (SINGULAIR) 10 MG tablet TAKE 1 TABLET BY MOUTH AT  BEDTIME (Patient taking differently: Take 10 mg by mouth at bedtime. )  . Multiple Vitamin (MULTIVITAMIN) tablet Take 1 tablet by mouth daily.  .  ondansetron (ZOFRAN) 4 MG tablet Take 1 tablet (4 mg total) by mouth 2 (two) times daily as needed for nausea or vomiting.  . RABEprazole (ACIPHEX) 20 MG tablet Take 20 mg by mouth 2 (two) times daily.  . rosuvastatin (CRESTOR) 10 MG tablet TAKE 1 TABLET BY MOUTH  DAILY  . sucralfate (CARAFATE) 1 g tablet Take 1 g by mouth daily as needed (GI sympotms).   . venlafaxine XR (EFFEXOR-XR) 150 MG 24 hr capsule TAKE 1 CAPSULE BY MOUTH  DAILY WITH BREAKFAST (Patient taking differently: Take 150 mg by mouth daily with breakfast. )  . vitamin B-12 (CYANOCOBALAMIN) 1000 MCG tablet Take 1,000 mcg by mouth daily.    Allergies:   Augmentin [amoxicillin-pot clavulanate], Bactrim [sulfamethoxazole-trimethoprim], Doxycycline, Hydrocodone-acetaminophen, Levaquin [levofloxacin], Pseudoephedrine, and Shellfish allergy   Social History   Socioeconomic History  . Marital  status: Married    Spouse name: Not on file  . Number of children: Not on file  . Years of education: Not on file  . Highest education level: Not on file  Occupational History  . Occupation: retired  Tobacco Use  . Smoking status: Former Smoker    Packs/day: 1.00    Years: 15.00    Pack years: 15.00    Quit date: 08/17/1983    Years since quitting: 36.5  . Smokeless tobacco: Never Used  Vaping Use  . Vaping Use: Never used  Substance and Sexual Activity  . Alcohol use: Yes    Alcohol/week: 0.0 standard drinks    Comment: rarely  . Drug use: No  . Sexual activity: Not on file  Other Topics Concern  . Not on file  Social History Narrative   Lives at home with husband   Social Determinants of Health   Financial Resource Strain:   . Difficulty of Paying Living Expenses:   Food Insecurity:   . Worried About Charity fundraiser in the Last Year:   . Arboriculturist in the Last Year:   Transportation Needs:   . Film/video editor (Medical):   Marland Kitchen Lack of Transportation (Non-Medical):   Physical Activity:   . Days of Exercise  per Week:   . Minutes of Exercise per Session:   Stress:   . Feeling of Stress :   Social Connections:   . Frequency of Communication with Friends and Family:   . Frequency of Social Gatherings with Friends and Family:   . Attends Religious Services:   . Active Member of Clubs or Organizations:   . Attends Archivist Meetings:   Marland Kitchen Marital Status:      Family History:  The patient's family history includes Breast cancer in her maternal aunt; Diabetes Mellitus II in her father; Heart Problems in her brother; Hypertension in her mother; Leukemia in her maternal aunt; Thyroid disease in her father. There is no history of Colon cancer, Kidney cancer, or Bladder Cancer.  ROS:   Review of Systems  Constitutional: Positive for malaise/fatigue. Negative for chills, diaphoresis, fever and weight loss.  HENT: Negative for congestion.   Eyes: Negative for discharge and redness.  Respiratory: Positive for shortness of breath. Negative for cough, sputum production and wheezing.   Cardiovascular: Positive for orthopnea and leg swelling. Negative for chest pain, palpitations, claudication and PND.  Gastrointestinal: Negative for abdominal pain, heartburn, nausea and vomiting.  Musculoskeletal: Positive for joint pain and myalgias. Negative for falls.  Skin: Negative for rash.  Neurological: Negative for dizziness, tingling, tremors, sensory change, speech change, focal weakness, loss of consciousness and weakness.  Endo/Heme/Allergies: Does not bruise/bleed easily.  Psychiatric/Behavioral: Negative for substance abuse. The patient is not nervous/anxious.   All other systems reviewed and are negative.    EKGs/Labs/Other Studies Reviewed:    Studies reviewed were summarized above. The additional studies were reviewed today:  2D echo 10/2019: 1. Left ventricular ejection fraction, by estimation, is 60 to 65%. The  left ventricle has normal function. The left ventricle has no regional    [place] motion abnormalities. There is mild left ventricular hypertrophy.  Left ventricular diastolic parameters  are consistent with Grade I diastolic dysfunction (impaired relaxation).  2. Right ventricular systolic function is normal. The right ventricular  size is normal. Tricuspid regurgitation signal is inadequate for assessing  PA pressure.  3. The mitral valve is normal in structure. Trivial mitral valve  regurgitation.  4. The aortic valve was not well visualized. Aortic valve regurgitation  is not visualized. No aortic stenosis is present. __________  Elwyn Reach patch 01/2019: 14-day ZIO monitor: Normal sinus rhythm with an average heart rate of 80 bpm. 7 episodes of short SVTs the longest lasted 7 beats. Rare PVCs. __________  [facility] 03/2018:  The left ventricular systolic function is normal.  LV end diastolic pressure is mildly elevated.  The left ventricular ejection fraction is 55-65% by visual estimate.   1.  Normal coronary arteries. 2.  Normal LV systolic function. 3.  Right heart catheterization showed mildly elevated filling pressures, high normal pulmonary pressure and normal cardiac output.  Pulmonary capillary wedge pressure was 13 mmHg, LVEDP was 17 mmHg and cardiac output was 7.6.  Recommendations: I do not see a clear explanation for the patient dyspnea from a cardiac standpoint.  She does have what seems to be mild diastolic heart failure but that does not fully explain her symptoms.  A small dose diuretic might be helpful.    EKG:  EKG is ordered today.  The EKG ordered today demonstrates NSR, 65 bpm, no acute ST-T changes  Recent Labs: 10/24/2019: TSH 2.008 11/07/2019: Hemoglobin 12.7; Platelets 423 02/29/2020: ALT 12; BUN 13; Creatinine, Ser 1.01; Potassium 4.1; Sodium 141  Recent Lipid Panel    Component Value Date/Time   CHOL 176 02/29/2020 0808   TRIG 143.0 02/29/2020 0808   HDL 55.30 02/29/2020 0808   CHOLHDL 3 02/29/2020 0808   VLDL 28.6  02/29/2020 0808   LDLCALC 92 02/29/2020 0808   LDLDIRECT 172.8 09/17/2013 0950    PHYSICAL EXAM:    VS:  BP 132/76 (BP Location: Left Arm, Patient Position: Sitting, Cuff Size: Normal)   Pulse 65   Ht 5\' 5"  (1.651 m)   Wt 245 lb (111.1 kg)   BMI 40.77 kg/m   BMI: Body mass index is 40.77 kg/m.  Physical Exam Constitutional:      Appearance: She is well-developed.  HENT:     Head: Normocephalic and atraumatic.  Eyes:     General:        Right eye: No discharge.        Left eye: No discharge.  Neck:     Vascular: No JVD.  Cardiovascular:     Rate and Rhythm: Normal rate and regular rhythm.     Pulses: No midsystolic click and no opening snap.          Dorsalis pedis pulses are 2+ on the right side and 2+ on the left side.       Posterior tibial pulses are 2+ on the right side and 2+ on the left side.     Heart sounds: Normal heart sounds, S1 normal and S2 normal. Heart sounds not distant. No murmur heard.  No friction rub.  Pulmonary:     Effort: Pulmonary effort is normal. No respiratory distress.     Breath sounds: Normal breath sounds. No decreased breath sounds, wheezing or rales.  Chest:     Chest [place]: No tenderness.  Abdominal:     General: There is no distension.     Palpations: Abdomen is soft.     Tenderness: There is no abdominal tenderness.  Musculoskeletal:     Cervical back: Normal range of motion.     Right lower leg: Edema present.     Left lower leg: Edema present.     Comments: Trace bilateral pretibial edema  Skin:    General:  Skin is warm and dry.     Nails: There is no clubbing.  Neurological:     Mental Status: She is alert and oriented to person, place, and time.  Psychiatric:        Speech: Speech normal.        Behavior: Behavior normal.        Thought Content: Thought content normal.        Judgment: Judgment normal.     Wt Readings from Last 3 Encounters:  03/03/20 245 lb (111.1 kg)  02/15/20 240 lb (108.9 kg)  12/04/19 241 lb  12.8 oz (109.7 kg)     ASSESSMENT & PLAN:   1. Diastolic dysfunction: Her weight is up 5 pounds today when compared to her last clinic visit.  She does note some mild lower extremity swelling, dyspnea, and orthopnea.  Trial of Lasix 20 mg to be used as needed with KCl 10 mEq for weight gain greater than 3 pounds overnight.  She has been advised to use Lasix very sparingly.  We will obtain a follow-up BMP when she is seen in the office next week to assess her response to gentle outpatient diuresis.  Optimal BP, heart rate, and weight control recommended.  2. Paroxysmal SVT: Symptoms are reasonably controlled on metoprolol.  3. Dyspnea: Likely multifactorial including diastolic dysfunction, asthma, anemia, sleep apnea, obesity, and physical deconditioning.  Prior [facility] in 2018 showed normal coronary arteries with no evidence of pulmonary hypertension and normal cardiac output.  Most recent echo from 10/2019 did show grade 1 diastolic dysfunction.  Advised patient use Lasix very sparingly as outlined above.  Follow-up with PCP and pulmonology as directed.  4. HTN: Blood pressure is well controlled today.  Continue metoprolol.  5. HLD: LDL 92.  Remains on Crestor.  Disposition: F/u with Dr. Fletcher Anon or an APP in 1 week.   Medication Adjustments/Labs and Tests Ordered: Current medicines are reviewed at length with the patient today.  Concerns regarding medicines are outlined above. Medication changes, Labs and Tests ordered today are summarized above and listed in the Patient Instructions accessible in Encounters.   Signed, Christell Faith, PA-C 03/03/2020 1:49 PM     [address] [place], [place] [address] [phone]

## 2020-02-29 ENCOUNTER — Other Ambulatory Visit (INDEPENDENT_AMBULATORY_CARE_PROVIDER_SITE_OTHER): Payer: Medicare Other

## 2020-02-29 ENCOUNTER — Other Ambulatory Visit: Payer: Self-pay

## 2020-02-29 DIAGNOSIS — E7800 Pure hypercholesterolemia, unspecified: Secondary | ICD-10-CM | POA: Diagnosis not present

## 2020-02-29 DIAGNOSIS — R739 Hyperglycemia, unspecified: Secondary | ICD-10-CM | POA: Diagnosis not present

## 2020-02-29 LAB — HEPATIC FUNCTION PANEL
ALT: 12 U/L (ref 0–35)
AST: 13 U/L (ref 0–37)
Albumin: 4 g/dL (ref 3.5–5.2)
Alkaline Phosphatase: 81 U/L (ref 39–117)
Bilirubin, Direct: 0.1 mg/dL (ref 0.0–0.3)
Total Bilirubin: 0.4 mg/dL (ref 0.2–1.2)
Total Protein: 7.2 g/dL (ref 6.0–8.3)

## 2020-02-29 LAB — BASIC METABOLIC PANEL
BUN: 13 mg/dL (ref 6–23)
CO2: 26 mEq/L (ref 19–32)
Calcium: 9.3 mg/dL (ref 8.4–10.5)
Chloride: 107 mEq/L (ref 96–112)
Creatinine, Ser: 1.01 mg/dL (ref 0.40–1.20)
GFR: 54.21 mL/min — ABNORMAL LOW (ref >60.00)
Glucose, Bld: 111 mg/dL — ABNORMAL HIGH (ref 70–99)
Potassium: 4.1 mEq/L (ref 3.5–5.1)
Sodium: 141 mEq/L (ref 135–145)

## 2020-02-29 LAB — LIPID PANEL
Cholesterol: 176 mg/dL (ref 0–200)
HDL: 55.3 mg/dL (ref >39.00)
LDL Cholesterol: 92 mg/dL (ref 0–99)
NonHDL: 120.23
Total CHOL/HDL Ratio: 3
Triglycerides: 143 mg/dL (ref 0.0–149.0)
VLDL: 28.6 mg/dL (ref 0.0–40.0)

## 2020-02-29 LAB — HEMOGLOBIN A1C: Hgb A1c MFr Bld: 5.9 % (ref 4.6–6.5)

## 2020-03-03 ENCOUNTER — Other Ambulatory Visit: Payer: Self-pay

## 2020-03-03 ENCOUNTER — Encounter: Payer: Self-pay | Admitting: Physician Assistant

## 2020-03-03 ENCOUNTER — Ambulatory Visit: Payer: Medicare Other | Admitting: Physician Assistant

## 2020-03-03 VITALS — BP 132/76 | HR 65 | Ht 65.0 in | Wt 245.0 lb

## 2020-03-03 DIAGNOSIS — I10 Essential (primary) hypertension: Secondary | ICD-10-CM | POA: Diagnosis not present

## 2020-03-03 DIAGNOSIS — R0602 Shortness of breath: Secondary | ICD-10-CM

## 2020-03-03 DIAGNOSIS — E785 Hyperlipidemia, unspecified: Secondary | ICD-10-CM

## 2020-03-03 DIAGNOSIS — I471 Supraventricular tachycardia: Secondary | ICD-10-CM

## 2020-03-03 DIAGNOSIS — I5189 Other ill-defined heart diseases: Secondary | ICD-10-CM

## 2020-03-03 MED ORDER — POTASSIUM CHLORIDE ER 10 MEQ PO TBCR
EXTENDED_RELEASE_TABLET | ORAL | Status: DC
Start: 2020-03-03 — End: 2023-10-11

## 2020-03-03 MED ORDER — FUROSEMIDE 20 MG PO TABS: ORAL_TABLET | ORAL | Status: AC

## 2020-03-03 NOTE — Patient Instructions (Signed)
Medication Instructions:  - Your physician has recommended you make the following change in your medication:   1) Lasix 20 mg- take 1 tablet daily as needed for weight gain of 3 lbs or more overnight  2) Potassium 10 meq- take 1 tablet daily as needed when taking lasix  *If you need a refill on your cardiac medications before your next appointment, please call your pharmacy*   Lab Work: - none ordered  If you have labs (blood work) drawn today and your tests are completely normal, you will receive your results only by: Marland Kitchen MyChart Message (if you have MyChart) OR . A paper copy in the mail If you have any lab test that is abnormal or we need to change your treatment, we will call you to review the results.   Testing/Procedures: - none ordered   Follow-Up: At [facility], you and your health needs are our priority.  As part of our continuing mission to provide you with exceptional heart care, we have created designated Provider Care Teams.  These Care Teams include your primary Cardiologist (physician) and Advanced Practice Providers (APPs -  Physician Assistants and Nurse Practitioners) who all work together to provide you with the care you need, when you need it.  We recommend signing up for the patient portal called "MyChart".  Sign up information is provided on this After Visit Summary.  MyChart is used to connect with patients for Virtual Visits (Telemedicine).  Patients are able to view lab/test results, encounter notes, upcoming appointments, etc.  Non-urgent messages can be sent to your provider as well.   To learn more about what you can do with MyChart, go to [email or web address].    Your next appointment:   At the end of next week   The format for your next appointment:   In Person  Provider:   - You may see Kathlyn Sacramento, MD or Christell Faith, PA    Other Instructions n/a

## 2020-03-06 DIAGNOSIS — H43813 Vitreous degeneration, bilateral: Secondary | ICD-10-CM | POA: Diagnosis not present

## 2020-03-06 NOTE — Progress Notes (Signed)
Cardiology Office Note    Date:  03/14/2020   ID:  Nicole Sims, DOB [birth year], MRN [id number]  PCP:  Einar Pheasant, MD  Cardiologist:  Kathlyn Sacramento, MD  Electrophysiologist:  None   Chief Complaint: Follow-up  History of Present Illness:   Nicole Sims is a 69 y.o. female with history of diastolic dysfunction, paroxysmal SVT, HTN, HLD, anemia, sleep apnea on CPAP, and obesity who presents for follow-up of dyspnea.  She was evaluated in 2019 for persistent exertional dyspnea.  R/LHC in 03/2018 showed normal coronary arteries with normal LV systolic function.  RHC showed mildly elevated filling pressures with pulmonary capillary wedge pressure of 13 mmHg, no pulmonary hypertension, and normal cardiac output.  CTA of the chest showed no evidence of PE.  She subsequently was evaluated for palpitations in 01/2019 with Zio patch showing a predominant rhythm of sinus with an average heart rate of 80 bpm.  She had short runs of SVT with the longest lasting 7 beats.  Toprol was titrated to 100 mg once daily with significant improvement in symptoms at her last visit in 02/2019.  She was evaluated by pulmonology in 10/2019 with increasing shortness of breath.  Echo at that time showing an EF of 60 to 65%, no regional Manfred motion abnormalities, mild LVH, grade 1 diastolic dysfunction, normal RV systolic function and RV cavity size, trivial mitral regurgitation.  VQ scan low probability for PE.  She was seen on 03/02/2020 noting a several week history of increased shortness of breath, though not to the degree that she was experiencing when she was evaluated by pulmonology back in the spring.  She also noted some lower extremity swelling and weight gain anywhere from 3 to 5 pounds intermittently overnight with some associated orthopnea.  Her palpitations were overall reasonably controlled.  She was noted to be on full dose aspirin under the direction of neurology.  Her weight was up 5 pounds at that visit  when compared to her prior clinic visit.  She was advised to undergo a trial of Lasix 20 mg to be taken as needed sparingly for weight gain greater than 3 pounds overnight.  She comes in today feeling about the same.  Her weight is down 3 pounds today when compared to her visit earlier this month.  She continues to note bilateral lower extremity weakness which she is followed by neurology for.  Shortness of breath is a stable and has been unchanged for several years.  She denies any orthopnea, lower extreme swelling, PND, or early satiety.  No chest pain.  She only took Lasix once since she was last seen and this was on 03/04/2020.   Labs independently reviewed: 02/2020 - albumin 4.0, AST/ALT normal, A1c 5.9, TC 176, TG 143, HDL 55, LDL 92, potassium 4.1, BUN 13, serum creatinine 1.01 10/2019 - HGB 12.7, PLT 423, TSH normal  Past Medical History:  Diagnosis Date  . Anemia   . Anxiety   . Asthma   . Cataract cortical, senile   . CHF (congestive heart failure) ([place])   . Chronic headaches   . Diastolic heart failure ([place])   . Diverticulosis   . Environmental allergies   . GERD (gastroesophageal reflux disease)   . Heart murmur   . Hyperglycemia   . Hyperlipidemia   . Hypertension   . Obesity   . Osteoarthritis   . Palpitations   . Restless leg syndrome   . Sleep apnea   . Thrombocytosis ([place])   .  Urinary incontinence    mixed  . Venous insufficiency   . Vitamin D deficiency     Past Surgical History:  Procedure Laterality Date  . BLADDER SURGERY     x2   [facility]  . BREAST CYST ASPIRATION Bilateral 2005   approximate year  . CARDIAC CATHETERIZATION     Nehem[facility] Massed  . CERVICAL CONE BIOPSY     [facility]  . CHOLECYSTECTOMY    . COLONOSCOPY WITH PROPOFOL N/A 07/19/2016   Procedure: COLONOSCOPY WITH PROPOFOL;  Surgeon: Manya Silvas, MD;  Location: [facility] ENDOSCOPY;  Service: Endoscopy;  Laterality: N/A;  . COLONOSCOPY WITH PROPOFOL N/A 10/20/2018   Procedure: COLONOSCOPY  WITH PROPOFOL;  Surgeon: Lollie Sails, MD;  Location: [facility] ENDOSCOPY;  Service: Endoscopy;  Laterality: N/A;  . ESOPHAGOGASTRODUODENOSCOPY (EGD) WITH PROPOFOL N/A 07/19/2016   Procedure: ESOPHAGOGASTRODUODENOSCOPY (EGD) WITH PROPOFOL;  Surgeon: Manya Silvas, MD;  Location: [facility] ENDOSCOPY;  Service: Endoscopy;  Laterality: N/A;  . FLEXIBLE SIGMOIDOSCOPY    . KNEE ARTHROSCOPY  08/13/08  . knee replacement and revision     left  . RECTOCELE REPAIR    . RIGHT/LEFT HEART CATH AND CORONARY ANGIOGRAPHY Bilateral 04/10/2018   Procedure: RIGHT/LEFT HEART CATH AND CORONARY ANGIOGRAPHY;  Surgeon: Wellington Hampshire, MD;  Location: [facility] CV LAB;  Service: Cardiovascular;  Laterality: Bilateral;  . ROTATOR CUFF REPAIR     bilateral  . SHOULDER SURGERY  11/17/05  . TONSILLECTOMY  1962  . VAGINAL HYSTERECTOMY  1974   abnormal pap and carcinoma in situ    Current Medications: Current Meds  Medication Sig  . albuterol (VENTOLIN HFA) 108 (90 Base) MCG/ACT inhaler Inhale 2 puffs into the lungs every 6 (six) hours as needed for wheezing or shortness of breath.  Marland Kitchen aspirin EC 325 MG tablet Take 325 mg by mouth daily.   . budesonide-formoterol (SYMBICORT) 80-4.5 MCG/ACT inhaler Inhale 2 puffs into the lungs 2 (two) times daily.  . Calcium Carb-Cholecalciferol (CALCIUM 500 +D) 500-400 MG-UNIT TABS Take 1 tablet by mouth daily.  . Cholecalciferol (VITAMIN D-3) 1000 units CAPS Take 1,000 Units by mouth daily.   . fluticasone (FLONASE) 50 MCG/ACT nasal spray Place 2 sprays into both nostrils daily.  . furosemide (LASIX) 20 MG tablet Take 1 tablet (20 mg) by mouth once daily as needed for weight gain of 3 lbs or more overnight  . gabapentin (NEURONTIN) 300 MG capsule Take 1 capsule (300 mg total) by mouth at bedtime as needed. (Patient taking differently: Take 300-600 mg by mouth at bedtime as needed. )  . magnesium oxide (MAG-OX) 400 MG tablet Take 400 mg daily by mouth.  . metoprolol succinate  (TOPROL-XL) 100 MG 24 hr tablet TAKE 1 TABLET(100 MG) BY MOUTH DAILY WITH OR IMMEDIATELY FOLLOWING A MEAL  . montelukast (SINGULAIR) 10 MG tablet TAKE 1 TABLET BY MOUTH AT  BEDTIME (Patient taking differently: Take 10 mg by mouth at bedtime. )  . Multiple Vitamin (MULTIVITAMIN) tablet Take 1 tablet by mouth daily.  . ondansetron (ZOFRAN) 4 MG tablet Take 1 tablet (4 mg total) by mouth 2 (two) times daily as needed for nausea or vomiting.  . potassium chloride (KLOR-CON) 10 MEQ tablet Take 1 tablet (10 meq) by mouth once daily as needed only when taking lasix (furosemide)  . RABEprazole (ACIPHEX) 20 MG tablet Take 20 mg by mouth 2 (two) times daily.  Marland Kitchen RIBOFLAVIN PO Take 200 mg by mouth in the morning and at bedtime.  . rosuvastatin (  CRESTOR) 10 MG tablet TAKE 1 TABLET BY MOUTH  DAILY  . sucralfate (CARAFATE) 1 g tablet Take 1 g by mouth daily as needed (GI sympotms).   . venlafaxine XR (EFFEXOR-XR) 150 MG 24 hr capsule TAKE 1 CAPSULE BY MOUTH  DAILY WITH BREAKFAST (Patient taking differently: Take 150 mg by mouth daily with breakfast. )  . vitamin B-12 (CYANOCOBALAMIN) 1000 MCG tablet Take 1,000 mcg by mouth daily.    Allergies:   Augmentin [amoxicillin-pot clavulanate], Bactrim [sulfamethoxazole-trimethoprim], Doxycycline, Hydrocodone-acetaminophen, Levaquin [levofloxacin], Pseudoephedrine, and Shellfish allergy   Social History   Socioeconomic History  . Marital status: Married    Spouse name: Not on file  . Number of children: Not on file  . Years of education: Not on file  . Highest education level: Not on file  Occupational History  . Occupation: retired  Tobacco Use  . Smoking status: Former Smoker    Packs/day: 1.00    Years: 15.00    Pack years: 15.00    Quit date: 08/17/1983    Years since quitting: 36.6  . Smokeless tobacco: Never Used  Vaping Use  . Vaping Use: Never used  Substance and Sexual Activity  . Alcohol use: Yes    Alcohol/week: 0.0 standard drinks    Comment:  rarely  . Drug use: No  . Sexual activity: Not on file  Other Topics Concern  . Not on file  Social History Narrative   Lives at home with husband   Social Determinants of Health   Financial Resource Strain:   . Difficulty of Paying Living Expenses:   Food Insecurity:   . Worried About Charity fundraiser in the Last Year:   . Arboriculturist in the Last Year:   Transportation Needs:   . Film/video editor (Medical):   Marland Kitchen Lack of Transportation (Non-Medical):   Physical Activity:   . Days of Exercise per Week:   . Minutes of Exercise per Session:   Stress:   . Feeling of Stress :   Social Connections:   . Frequency of Communication with Friends and Family:   . Frequency of Social Gatherings with Friends and Family:   . Attends Religious Services:   . Active Member of Clubs or Organizations:   . Attends Archivist Meetings:   Marland Kitchen Marital Status:      Family History:  The patient's family history includes Breast cancer in her maternal aunt; Diabetes Mellitus II in her father; Heart Problems in her brother; Hypertension in her mother; Leukemia in her maternal aunt; Thyroid disease in her father. There is no history of Colon cancer, Kidney cancer, or Bladder Cancer.  ROS:   Review of Systems  Constitutional: Positive for malaise/fatigue. Negative for chills, diaphoresis, fever and weight loss.  HENT: Negative for congestion.   Eyes: Negative for discharge and redness.  Respiratory: Positive for shortness of breath. Negative for cough, sputum production and wheezing.   Cardiovascular: Negative for chest pain, palpitations, orthopnea, claudication, leg swelling and PND.  Gastrointestinal: Negative for abdominal pain, blood in stool, heartburn, melena, nausea and vomiting.  Musculoskeletal: Negative for falls and myalgias.  Skin: Negative for rash.  Neurological: Positive for weakness. Negative for dizziness, tingling, tremors, sensory change, speech change, focal  weakness and loss of consciousness.  Endo/Heme/Allergies: Does not bruise/bleed easily.  Psychiatric/Behavioral: Negative for substance abuse. The patient is not nervous/anxious.   All other systems reviewed and are negative.    EKGs/Labs/Other Studies Reviewed:  Studies reviewed were summarized above. The additional studies were reviewed today:  2D echo 10/2019: 1. Left ventricular ejection fraction, by estimation, is 60 to 65%. The  left ventricle has normal function. The left ventricle has no regional  Delehanty motion abnormalities. There is mild left ventricular hypertrophy.  Left ventricular diastolic parameters  are consistent with Grade I diastolic dysfunction (impaired relaxation).  2. Right ventricular systolic function is normal. The right ventricular  size is normal. Tricuspid regurgitation signal is inadequate for assessing  PA pressure.  3. The mitral valve is normal in structure. Trivial mitral valve  regurgitation.  4. The aortic valve was not well visualized. Aortic valve regurgitation  is not visualized. No aortic stenosis is present. __________  Elwyn Reach patch 01/2019: 14-day ZIO monitor: Normal sinus rhythm with an average heart rate of 80 bpm. 7 episodes of short SVTs the longest lasted 7 beats. Rare PVCs. __________  [facility] 03/2018:  The left ventricular systolic function is normal.  LV end diastolic pressure is mildly elevated.  The left ventricular ejection fraction is 55-65% by visual estimate.  1. Normal coronary arteries. 2. Normal LV systolic function. 3. Right heart catheterization showed mildly elevated filling pressures, high normal pulmonary pressure and normal cardiac output. Pulmonary capillary wedge pressure was 13 mmHg, LVEDP was 17 mmHg and cardiac output was 7.6.  Recommendations: I do not see a clear explanation for the patient dyspnea from a cardiac standpoint. She does have what seems to be mild diastolic heart failure but that  does not fully explain her symptoms. A small dose diuretic might be helpful.   EKG:  EKG is ordered today.  The EKG ordered today demonstrates NSR, 67 bpm, low voltage QRS, no acute ST-T changes  Recent Labs: 10/24/2019: TSH 2.008 11/07/2019: Hemoglobin 12.7; Platelets 423 02/29/2020: ALT 12; BUN 13; Creatinine, Ser 1.01; Potassium 4.1; Sodium 141  Recent Lipid Panel    Component Value Date/Time   CHOL 176 02/29/2020 0808   TRIG 143.0 02/29/2020 0808   HDL 55.30 02/29/2020 0808   CHOLHDL 3 02/29/2020 0808   VLDL 28.6 02/29/2020 0808   LDLCALC 92 02/29/2020 0808   LDLDIRECT 172.8 09/17/2013 0950    PHYSICAL EXAM:    VS:  BP 120/80 (BP Location: Left Arm, Patient Position: Sitting, Cuff Size: Normal)   Pulse 67   Ht 5\' 5"  (1.651 m)   Wt (!) 242 lb 6 oz (109.9 kg)   SpO2 98%   BMI 40.33 kg/m   BMI: Body mass index is 40.33 kg/m.  Physical Exam  Wt Readings from Last 3 Encounters:  03/14/20 (!) 242 lb 6 oz (109.9 kg)  03/03/20 245 lb (111.1 kg)  02/15/20 240 lb (108.9 kg)     ASSESSMENT & PLAN:   1. Diastolic dysfunction: She appears euvolemic and well compensated.  Her weight is down 3 pounds today when compared to last clinic visit.  She has only taking Lasix 20 mg once since she was last seen.  In this setting, I will defer follow-up labs at this time.  Refer to cardiopulmonary rehab.  Continue as needed Lasix for weight gain greater than 2 to 3 pounds overnight or 5 pounds in a 1 week time span.  If she is needing to take this on a regular basis she will let us know.  Optimal blood pressure and heart rate recommended.  2. Paroxysmal SVT: Palpitations well controlled on metoprolol.  3. Dyspnea: Likely multifactorial including diastolic dysfunction, asthma, sleep apnea, obesity, and physical deconditioning.  Prior R/LHC in 2019 showed normal coronary arteries with no evidence of pulmonary pretension and normal cardiac output.  Most recent echo from 10/2019 did show grade 1  diastolic dysfunction.  Suspect symptoms are less likely cardiac in etiology.  Suspect this is more related to underlying obesity and physical deconditioning.  Refer to cardiopulmonary rehab.  4. HTN: Blood pressure is well controlled in the office today.  Continue metoprolol as above.  5. HLD: LDL 92.  Remains on Crestor.  Disposition: F/u with Dr. Fletcher Anon or an APP in 6 months.   Medication Adjustments/Labs and Tests Ordered: Current medicines are reviewed at length with the patient today.  Concerns regarding medicines are outlined above. Medication changes, Labs and Tests ordered today are summarized above and listed in the Patient Instructions accessible in Encounters.   Signed, Christell Faith, PA-C 03/14/2020 8:08 AM     [facility] [address] [place] [address] [place], [place] [address] [phone]

## 2020-03-12 DIAGNOSIS — M5136 Other intervertebral disc degeneration, lumbar region: Secondary | ICD-10-CM | POA: Diagnosis not present

## 2020-03-12 DIAGNOSIS — I679 Cerebrovascular disease, unspecified: Secondary | ICD-10-CM | POA: Diagnosis not present

## 2020-03-12 DIAGNOSIS — R2689 Other abnormalities of gait and mobility: Secondary | ICD-10-CM | POA: Diagnosis not present

## 2020-03-12 DIAGNOSIS — R29898 Other symptoms and signs involving the musculoskeletal system: Secondary | ICD-10-CM | POA: Diagnosis not present

## 2020-03-12 DIAGNOSIS — R202 Paresthesia of skin: Secondary | ICD-10-CM | POA: Diagnosis not present

## 2020-03-14 ENCOUNTER — Encounter: Payer: Self-pay | Admitting: Physician Assistant

## 2020-03-14 ENCOUNTER — Other Ambulatory Visit: Payer: Self-pay

## 2020-03-14 ENCOUNTER — Ambulatory Visit: Payer: Medicare Other | Admitting: Physician Assistant

## 2020-03-14 VITALS — BP 120/80 | HR 67 | Ht 65.0 in | Wt 242.4 lb

## 2020-03-14 DIAGNOSIS — I5189 Other ill-defined heart diseases: Secondary | ICD-10-CM | POA: Diagnosis not present

## 2020-03-14 DIAGNOSIS — I471 Supraventricular tachycardia: Secondary | ICD-10-CM | POA: Diagnosis not present

## 2020-03-14 DIAGNOSIS — I10 Essential (primary) hypertension: Secondary | ICD-10-CM

## 2020-03-14 DIAGNOSIS — E785 Hyperlipidemia, unspecified: Secondary | ICD-10-CM | POA: Diagnosis not present

## 2020-03-14 DIAGNOSIS — R0600 Dyspnea, unspecified: Secondary | ICD-10-CM

## 2020-03-14 NOTE — Patient Instructions (Signed)
Medication Instructions:  Your physician recommends that you continue on your current medications as directed. Please refer to the Current Medication list given to you today.  *If you need a refill on your cardiac medications before your next appointment, please call your pharmacy*   Lab Work: None ordered If you have labs (blood work) drawn today and your tests are completely normal, you will receive your results only by: Marland Kitchen MyChart Message (if you have MyChart) OR . A paper copy in the mail If you have any lab test that is abnormal or we need to change your treatment, we will call you to review the results.   Testing/Procedures: None ordered   Follow-Up: At [facility], you and your health needs are our priority.  As part of our continuing mission to provide you with exceptional heart care, we have created designated Provider Care Teams.  These Care Teams include your primary Cardiologist (physician) and Advanced Practice Providers (APPs -  Physician Assistants and Nurse Practitioners) who all work together to provide you with the care you need, when you need it.  We recommend signing up for the patient portal called "MyChart".  Sign up information is provided on this After Visit Summary.  MyChart is used to connect with patients for Virtual Visits (Telemedicine).  Patients are able to view lab/test results, encounter notes, upcoming appointments, etc.  Non-urgent messages can be sent to your provider as well.   To learn more about what you can do with MyChart, go to [email or web address].    Your next appointment:   6 month(s)  The format for your next appointment:   In Person  Provider:    You may see Kathlyn Sacramento, MD or Christell Faith, PA-C  Other Instructions 1- Ref to pulmonary rehab

## 2020-03-17 ENCOUNTER — Other Ambulatory Visit: Payer: Self-pay | Admitting: *Deleted

## 2020-03-17 DIAGNOSIS — R0600 Dyspnea, unspecified: Secondary | ICD-10-CM

## 2020-03-25 ENCOUNTER — Other Ambulatory Visit: Payer: Self-pay | Admitting: Physician Assistant

## 2020-03-30 ENCOUNTER — Other Ambulatory Visit: Payer: Self-pay | Admitting: Internal Medicine

## 2020-03-31 ENCOUNTER — Ambulatory Visit: Payer: Medicare Other

## 2020-04-02 DIAGNOSIS — Z20822 Contact with and (suspected) exposure to covid-19: Secondary | ICD-10-CM | POA: Diagnosis not present

## 2020-04-07 ENCOUNTER — Ambulatory Visit: Payer: Medicare Other

## 2020-04-09 ENCOUNTER — Encounter: Payer: Self-pay | Admitting: Anesthesiology

## 2020-04-09 ENCOUNTER — Ambulatory Visit: Payer: Medicare Other

## 2020-04-10 DIAGNOSIS — M47812 Spondylosis without myelopathy or radiculopathy, cervical region: Secondary | ICD-10-CM | POA: Diagnosis not present

## 2020-04-10 DIAGNOSIS — M5412 Radiculopathy, cervical region: Secondary | ICD-10-CM | POA: Diagnosis not present

## 2020-04-10 DIAGNOSIS — M5030 Other cervical disc degeneration, unspecified cervical region: Secondary | ICD-10-CM | POA: Diagnosis not present

## 2020-04-11 DIAGNOSIS — H2511 Age-related nuclear cataract, right eye: Secondary | ICD-10-CM | POA: Diagnosis not present

## 2020-04-14 ENCOUNTER — Ambulatory Visit: Payer: Medicare Other

## 2020-04-16 ENCOUNTER — Ambulatory Visit: Payer: Medicare Other

## 2020-04-18 ENCOUNTER — Other Ambulatory Visit: Payer: Self-pay

## 2020-04-18 ENCOUNTER — Telehealth: Payer: Self-pay | Admitting: Family

## 2020-04-18 ENCOUNTER — Other Ambulatory Visit: Admission: RE | Admit: 2020-04-18 | Payer: Medicare Other | Source: Ambulatory Visit

## 2020-04-18 ENCOUNTER — Ambulatory Visit: Payer: Medicare Other | Admitting: Internal Medicine

## 2020-04-18 ENCOUNTER — Encounter: Payer: Self-pay | Admitting: Family

## 2020-04-18 ENCOUNTER — Ambulatory Visit: Payer: Medicare Other | Admitting: Family

## 2020-04-18 ENCOUNTER — Encounter: Payer: Self-pay | Admitting: Ophthalmology

## 2020-04-18 VITALS — BP 128/88 | HR 64 | Ht 65.0 in | Wt 239.0 lb

## 2020-04-18 DIAGNOSIS — I10 Essential (primary) hypertension: Secondary | ICD-10-CM | POA: Diagnosis not present

## 2020-04-18 DIAGNOSIS — Z01810 Encounter for preprocedural cardiovascular examination: Secondary | ICD-10-CM | POA: Diagnosis not present

## 2020-04-18 DIAGNOSIS — I5189 Other ill-defined heart diseases: Secondary | ICD-10-CM | POA: Diagnosis not present

## 2020-04-18 DIAGNOSIS — R0600 Dyspnea, unspecified: Secondary | ICD-10-CM

## 2020-04-18 DIAGNOSIS — B370 Candidal stomatitis: Secondary | ICD-10-CM

## 2020-04-18 DIAGNOSIS — I471 Supraventricular tachycardia: Secondary | ICD-10-CM

## 2020-04-18 DIAGNOSIS — R0609 Other forms of dyspnea: Secondary | ICD-10-CM

## 2020-04-18 MED ORDER — NYSTATIN 100000 UNIT/ML MT SUSP
5.0000 mL | Freq: Three times a day (TID) | OROMUCOSAL | 0 refills | Status: DC | PRN
Start: 2020-04-18 — End: 2020-06-27

## 2020-04-18 NOTE — Telephone Encounter (Signed)
Called to discuss clearance via phone as it appears it is only for cataract surgery. Left VM. Anticipate she is on her way to clinic.   Loel Dubonnet, NP

## 2020-04-18 NOTE — Patient Instructions (Signed)
Medication Instructions:  Your provider has recommended you make the following change in your medication:   A prescription for as-needed Nystatin has been sent to your pharmacy.   Please take your Singulair inhaler twice daily as prescribed.   Take Lasix 20mg  daily for 2 days then return to taking only as-needed. Hopeful this will improve your shortness of breath.  *If you need a refill on your cardiac medications before your next appointment, please call your pharmacy*  Lab Work: No lab work today.   Testing/Procedures: Your EKG today shows normal sinus rhythm which is a great result!  Your Reds Vest reading did not show significant volume overload - this is a good result!  Follow-Up: At [facility], you and your health needs are our priority.  As part of our continuing mission to provide you with exceptional heart care, we have created designated Provider Care Teams.  These Care Teams include your primary Cardiologist (physician) and Advanced Practice Providers (APPs -  Physician Assistants and Nurse Practitioners) who all work together to provide you with the care you need, when you need it.  We recommend signing up for the patient portal called "MyChart".  Sign up information is provided on this After Visit Summary.  MyChart is used to connect with patients for Virtual Visits (Telemedicine).  Patients are able to view lab/test results, encounter notes, upcoming appointments, etc.  Non-urgent messages can be sent to your provider as well.   To learn more about what you can do with MyChart, go to [email or web address].    Your next appointment:   February 2022 with Dr. Fletcher Anon as previously scheduled   Other Instructions  A note will be sent to your eye doctor that you are cleared for cardiac surgery.   Your dyspnea is not likely related to your heart. It is likely a combination of your upper respiratory infection, asthma, and deconditioning. You have been referred to  cardiopulmonary rehab and we encourage you to participate when you are done with your eye surgery. Their phone number is [phone].   If your dyspnea persists, recommend talking to your primary care provider or pulmonologist.

## 2020-04-18 NOTE — Progress Notes (Signed)
Office Visit    Patient Name: Nicole Sims Date of Encounter: 04/18/2020  Primary Care Provider:  Einar Pheasant, MD Primary Cardiologist:  Kathlyn Sacramento, MD Electrophysiologist:  None   Chief Complaint    Nicole Sims is a 69 y.o. female with a hx of diastolic dysfunction, paroxysmal SVT, HTN, HLD, anemia, OSA on CPAP, obesity presents today for cardiac clearance for surgery.   Past Medical History    Past Medical History:  Diagnosis Date  . Anemia   . Anxiety   . Asthma   . Cataract cortical, senile   . CHF (congestive heart failure) ([place])   . Chronic headaches   . Diastolic heart failure ([place])   . Diverticulosis   . Environmental allergies   . GERD (gastroesophageal reflux disease)   . Heart murmur   . Hyperglycemia   . Hyperlipidemia   . Hypertension   . Obesity   . Osteoarthritis   . Palpitations   . Restless leg syndrome   . Sleep apnea   . Thrombocytosis ([place])   . Urinary incontinence    mixed  . Venous insufficiency   . Vitamin D deficiency    Past Surgical History:  Procedure Laterality Date  . BLADDER SURGERY     x2   [place] [facility] [place]  . BREAST CYST ASPIRATION Bilateral 2005   approximate year  . CARDIAC CATHETERIZATION     Nehem[facility] Massed  . CERVICAL CONE BIOPSY     [facility]  . CHOLECYSTECTOMY    . COLONOSCOPY WITH PROPOFOL N/A 07/19/2016   Procedure: COLONOSCOPY WITH PROPOFOL;  Surgeon: Manya Silvas, MD;  Location: [facility] ENDOSCOPY;  Service: Endoscopy;  Laterality: N/A;  . COLONOSCOPY WITH PROPOFOL N/A 10/20/2018   Procedure: COLONOSCOPY WITH PROPOFOL;  Surgeon: Lollie Sails, MD;  Location: [facility] ENDOSCOPY;  Service: Endoscopy;  Laterality: N/A;  . ESOPHAGOGASTRODUODENOSCOPY (EGD) WITH PROPOFOL N/A 07/19/2016   Procedure: ESOPHAGOGASTRODUODENOSCOPY (EGD) WITH PROPOFOL;  Surgeon: Manya Silvas, MD;  Location: [facility] ENDOSCOPY;  Service: Endoscopy;  Laterality: N/A;  . FLEXIBLE SIGMOIDOSCOPY    . KNEE ARTHROSCOPY  08/13/08  . knee  replacement and revision     left  . RECTOCELE REPAIR    . RIGHT/LEFT HEART CATH AND CORONARY ANGIOGRAPHY Bilateral 04/10/2018   Procedure: RIGHT/LEFT HEART CATH AND CORONARY ANGIOGRAPHY;  Surgeon: Wellington Hampshire, MD;  Location: [place];  Service: Cardiovascular;  Laterality: Bilateral;  . ROTATOR CUFF REPAIR     bilateral  . SHOULDER SURGERY  11/17/05  . TONSILLECTOMY  1962  . VAGINAL HYSTERECTOMY  1974   abnormal pap and carcinoma in situ    Allergies  Allergies  Allergen Reactions  . Augmentin [Amoxicillin-Pot Clavulanate] Other (See Comments)    Questionable itching  . Bactrim [Sulfamethoxazole-Trimethoprim]   . Doxycycline Itching  . Hydrocodone-Acetaminophen Other (See Comments)    GI distress  . Levaquin [Levofloxacin]   . Pseudoephedrine     Itching of the scalp  . Shellfish Allergy     Nausea and vomiting     History of Present Illness    Nicole Sims is a 69 y.o. female with a hx of diastolic dysfunction, paroxysmal SVT, HTN, HLD, anemia, OSA on CPAP, obesity last seen 03/14/20 by Christell Faith, PA.  Evaluated in 2019 for persistent exertional dyspnea.  Right/left heart cath 03/2018 normal coronary arteries with normal LVEF, RHC with mild elevated filling pressures with pulmonary capillary wedge pressure 30 mmHg, no pulmonary hypertension, normal cardiac output.  CTA  of chest no evidence of PE.  Subsequently evaluated for palpitations 6-20 20 with Zio patch showing predominant sinus rhythm with short runs of SVT longest lasting 7 beats.  Toprol was titrated 100 mg once daily with improvement in her symptoms.  Seen by pulmonology 10/2019 with increasing shortness of breath.  Echo at that time with EF 60 to 65%, no R WMA, mild LVH, grade 1 diastolic function, normal RV systolic function, RV cavity size, trivial MR.  VQ scan low probability for PE.  She is on full dose aspirin at the direction of neurology.  She follows with their office for bilateral lower  extremity weakness.  Seen in clinic 03/14/2020 feeling about the same after a trial of Lasix 20 mg daily as needed for weight gain greater than 3 pounds overnight. She did note dyspnea but was euvolemic on exam.  She was referred to cardiopulmonary rehab. Tells me she is deferring this until her cataract surgery.   Chief complaint today of dyspnea. This is overall unchanged compared to previous. Yesterday she took an extra 50mg  of Metoprolol to see if it helped with her breathing. She had not had good relief with her inhaler nor her nebulizer. Has not been taking SYmbicort regularly.   Reports chest pain 3-4 days this week which occurs under her left breast. She has not taken nitroglycerin. When the pain started she said it was "uncomfortable" and she was at rest. These have happened for some time. Tells me they self resolve. Not exacerbated by exertion.   She last took her Lasix a few months ago. Tells me she is having to go to the restroom more. Does feel a bit "up on her fluid" today.  She notes she has had some cough and drainage. She is coughing up a "cloudy" mucus. Reports last week she had a 101.5 low grade fever. Tells me she feels she might have some thrush in the back of her throat from her inhalers. She has been taking CF Tussin for her draininage with some improvement.  She has right cataract surgery upcoming 04/24/20   EKGs/Labs/Other Studies Reviewed:   The following studies were reviewed today:  Echo 10/30/19  1. Left ventricular ejection fraction, by estimation, is 60 to 65%. The  left ventricle has normal function. The left ventricle has no regional  Connors motion abnormalities. There is mild left ventricular hypertrophy.  Left ventricular diastolic parameters  are consistent with Grade I diastolic dysfunction (impaired relaxation).   2. Right ventricular systolic function is normal. The right ventricular  size is normal. Tricuspid regurgitation signal is inadequate for assessing    PA pressure.   3. The mitral valve is normal in structure. Trivial mitral valve  regurgitation.   4. The aortic valve was not well visualized. Aortic valve regurgitation  is not visualized. No aortic stenosis is present.   Carotid duplex 06/2019 RIGHT CAROTID ARTERY: Minor echogenic shadowing plaque formation. No hemodynamically significant right ICA stenosis, velocity elevation, or turbulent flow. Degree of narrowing less than 50%.   RIGHT VERTEBRAL ARTERY:  Antegrade   LEFT CAROTID ARTERY: Similar scattered minor echogenic plaque formation. No hemodynamically significant left ICA stenosis, velocity elevation, or turbulent flow.   LEFT VERTEBRAL ARTERY:  Antegrade   IMPRESSION: Minor carotid atherosclerosis. No hemodynamically significant ICA stenosis. Degree of narrowing less than 50% bilaterally by ultrasound criteria.   Patent antegrade vertebral flow bilaterally  Cardiac catheterization 03/2018  The left ventricular systolic function is normal.  LV end diastolic pressure is  mildly elevated.  The left ventricular ejection fraction is 55-65% by visual estimate.   1.  Normal coronary arteries. 2.  Normal LV systolic function. 3.  Right heart catheterization showed mildly elevated filling pressures, high normal pulmonary pressure and normal cardiac output.  Pulmonary capillary wedge pressure was 13 mmHg, LVEDP was 17 mmHg and cardiac output was 7.6.   Recommendations: I do not see a clear explanation for the patient dyspnea from a cardiac standpoint.  She does have what seems to be mild diastolic heart failure but that does not fully explain her symptoms.  A small dose diuretic might be helpful.  EKG:  EKG is ordered today.  The ekg ordered today demonstrates NSR 64 bpm with no acute ST/T wave changes.   Recent Labs: 10/24/2019: TSH 2.008 11/07/2019: Hemoglobin 12.7; Platelets 423 02/29/2020: ALT 12; BUN 13; Creatinine, Ser 1.01; Potassium 4.1; Sodium 141  Recent Lipid  Panel    Component Value Date/Time   CHOL 176 02/29/2020 0808   TRIG 143.0 02/29/2020 0808   HDL 55.30 02/29/2020 0808   CHOLHDL 3 02/29/2020 0808   VLDL 28.6 02/29/2020 0808   LDLCALC 92 02/29/2020 0808   LDLDIRECT 172.8 09/17/2013 0950    Home Medications   Current Meds  Medication Sig  . albuterol (VENTOLIN HFA) 108 (90 Base) MCG/ACT inhaler Inhale 2 puffs into the lungs every 6 (six) hours as needed for wheezing or shortness of breath.  Marland Kitchen aspirin EC 325 MG tablet Take 325 mg by mouth daily.   . budesonide-formoterol (SYMBICORT) 80-4.5 MCG/ACT inhaler Inhale 2 puffs into the lungs 2 (two) times daily.  . Calcium Carb-Cholecalciferol (CALCIUM 500 +D) 500-400 MG-UNIT TABS Take 1 tablet by mouth daily.  . Cholecalciferol (VITAMIN D-3) 1000 units CAPS Take 1,000 Units by mouth daily.   . fluticasone (FLONASE) 50 MCG/ACT nasal spray Place 2 sprays into both nostrils daily.  . furosemide (LASIX) 20 MG tablet Take 1 tablet (20 mg) by mouth once daily as needed for weight gain of 3 lbs or more overnight  . gabapentin (NEURONTIN) 300 MG capsule Take 1 capsule (300 mg total) by mouth at bedtime as needed. (Patient taking differently: Take 300-600 mg by mouth at bedtime as needed. )  . magnesium oxide (MAG-OX) 400 MG tablet Take 400 mg daily by mouth.  . metoprolol succinate (TOPROL-XL) 100 MG 24 hr tablet Take 100 mg by mouth daily.   . montelukast (SINGULAIR) 10 MG tablet TAKE 1 TABLET BY MOUTH AT  BEDTIME (Patient taking differently: Take 10 mg by mouth at bedtime. )  . Multiple Vitamin (MULTIVITAMIN) tablet Take 1 tablet by mouth daily.  . ondansetron (ZOFRAN) 4 MG tablet Take 1 tablet (4 mg total) by mouth 2 (two) times daily as needed for nausea or vomiting.  . potassium chloride (KLOR-CON) 10 MEQ tablet Take 1 tablet (10 meq) by mouth once daily as needed only when taking lasix (furosemide)  . RABEprazole (ACIPHEX) 20 MG tablet Take 20 mg by mouth 2 (two) times daily.  Marland Kitchen RIBOFLAVIN PO  Take 200 mg by mouth in the morning and at bedtime.  . rosuvastatin (CRESTOR) 10 MG tablet TAKE 1 TABLET BY MOUTH  DAILY  . sucralfate (CARAFATE) 1 g tablet Take 1 g by mouth daily as needed (GI sympotms).   . venlafaxine XR (EFFEXOR-XR) 150 MG 24 hr capsule TAKE 1 CAPSULE BY MOUTH  DAILY WITH BREAKFAST  . vitamin B-12 (CYANOCOBALAMIN) 1000 MCG tablet Take 1,000 mcg by mouth daily.  Review of Systems   All other systems reviewed and are otherwise negative except as noted above.  Physical Exam    VS:  BP 128/88 (BP Location: Left Arm, Patient Position: Sitting, Cuff Size: Large)   Pulse 64   Ht 5\' 5"  (1.651 m)   Wt 239 lb (108.4 kg)   SpO2 98%   BMI 39.77 kg/m  , BMI Body mass index is 39.77 kg/m. GEN: Well nourished, overweight, well developed, in no acute distress. HEENT: (+) thrush. Neck: Supple, no JVD, carotid bruits, or masses. Cardiac: RRR, no murmurs, rubs, or gallops. No clubbing, cyanosis, edema.  Radials/DP/PT 2+ and equal bilaterally.  Respiratory:  Respirations regular and unlabored, clear to auscultation bilaterally. GI: Soft, nontender, nondistended, BS + x 4. MS: No deformity or atrophy. Skin: Warm and dry, no rash. Neuro:  Strength and sensation are intact. Psych: Normal affect.   Assessment & Plan    1. Preoperative cardiovascular clearance - Cataract extractions are recognized in guidelines as low risk surgeries that do not typically require specific preoperative testing or holding of blood thinner therapy. Therefore, based on ACC/AHA guidelines, Nicole Sims would be at acceptable risk for the planned procedure without further cardiovascular testing.   2. Diastolic dysfunction - Euvolemic and well compensated. Nicole Sims 32% with no evidence of volume overload. Weight down 3 lbs compared to last clinic visit. Encouraged to participate in cardiopulmonary rehab to which she has been previously referred. She will take her Lasix 20mg  daily for 2 days as she  feels "fluid build up" despite normal Nicole Sims and weight loss. After that time, return to as needed for weight gain of 2-3 pounds overnight or 5 pounds in 1 week.   3. Paroxysmal SVT- No evidence of recurrence. Continue TOprol 100mg  daily. EKG today NSR with no acute ST/T wave changes.   4. Dyspnea- Multirfactorial including diastolic dysfunction, asthma, sleep apnea, obesity, physical conditioning. L/RHC 2019 with normal coronary artery, no pulmonary hypertension, normal cardiac output. Echo 10/2019 with normal LVEF, gr1DD, no RWMA. EKG today NSR with no acute ST/T wave changes. Low suspicion cardiac etiology particularly as she reports URI symptoms and has not been using her Symbicort regularly as prescribed. No indication for further cardiac evaluation at this time. Reassurance provided.   5. HTN-  BP well controlled. Continue current antihypertensive regimen.   6. HLD- Continue present statin.   7. OSA - Continued CPAP compliance encouraged.   8. Thrush - Secondary to increased albuterol use. Rx for Nystatin provided.    Disposition: Follow up in February as previously scheduled with Dr. Fletcher Anon or APP   Nicole Dubonnet, NP 04/18/2020, 3:27 PM

## 2020-04-22 ENCOUNTER — Ambulatory Visit: Payer: Medicare Other | Admitting: Physician Assistant

## 2020-04-22 NOTE — Discharge Instructions (Signed)

## 2020-04-23 ENCOUNTER — Ambulatory Visit: Payer: Medicare Other

## 2020-04-23 ENCOUNTER — Other Ambulatory Visit: Payer: Self-pay

## 2020-04-23 ENCOUNTER — Other Ambulatory Visit
Admission: RE | Admit: 2020-04-23 | Discharge: 2020-04-23 | Disposition: A | Discharge status: Home / Self Care | Payer: Medicare Other | Source: Ambulatory Visit | Attending: Ophthalmology | Admitting: Ophthalmology

## 2020-04-23 DIAGNOSIS — Z20822 Contact with and (suspected) exposure to covid-19: Secondary | ICD-10-CM | POA: Insufficient documentation

## 2020-04-23 DIAGNOSIS — Z01812 Encounter for preprocedural laboratory examination: Secondary | ICD-10-CM | POA: Diagnosis not present

## 2020-04-23 LAB — SARS CORONAVIRUS 2 (TAT 6-24 HRS): SARS Coronavirus 2: NEGATIVE

## 2020-04-24 ENCOUNTER — Ambulatory Visit: Payer: Medicare Other | Admitting: Anesthesiology

## 2020-04-24 ENCOUNTER — Other Ambulatory Visit: Payer: Self-pay

## 2020-04-24 ENCOUNTER — Encounter
Admission: RE | Disposition: A | Discharge status: Home / Self Care | Payer: Self-pay | Source: Home / Self Care | Attending: Ophthalmology

## 2020-04-24 ENCOUNTER — Ambulatory Visit: Admit: 2020-04-24 | Payer: Medicare Other | Admitting: Ophthalmology

## 2020-04-24 ENCOUNTER — Encounter: Payer: Self-pay | Admitting: Ophthalmology

## 2020-04-24 ENCOUNTER — Ambulatory Visit
Admission: RE | Admit: 2020-04-24 | Discharge: 2020-04-24 | Disposition: A | Discharge status: Home / Self Care | Payer: Medicare Other | Attending: Ophthalmology | Admitting: Ophthalmology

## 2020-04-24 DIAGNOSIS — Z87891 Personal history of nicotine dependence: Secondary | ICD-10-CM | POA: Insufficient documentation

## 2020-04-24 DIAGNOSIS — G43909 Migraine, unspecified, not intractable, without status migrainosus: Secondary | ICD-10-CM | POA: Insufficient documentation

## 2020-04-24 DIAGNOSIS — J45909 Unspecified asthma, uncomplicated: Secondary | ICD-10-CM | POA: Diagnosis not present

## 2020-04-24 DIAGNOSIS — Z7982 Long term (current) use of aspirin: Secondary | ICD-10-CM | POA: Diagnosis not present

## 2020-04-24 DIAGNOSIS — Z79891 Long term (current) use of opiate analgesic: Secondary | ICD-10-CM | POA: Insufficient documentation

## 2020-04-24 DIAGNOSIS — H2511 Age-related nuclear cataract, right eye: Secondary | ICD-10-CM | POA: Insufficient documentation

## 2020-04-24 DIAGNOSIS — G4730 Sleep apnea, unspecified: Secondary | ICD-10-CM | POA: Insufficient documentation

## 2020-04-24 DIAGNOSIS — H25811 Combined forms of age-related cataract, right eye: Secondary | ICD-10-CM | POA: Diagnosis not present

## 2020-04-24 DIAGNOSIS — I5030 Unspecified diastolic (congestive) heart failure: Secondary | ICD-10-CM | POA: Insufficient documentation

## 2020-04-24 DIAGNOSIS — F419 Anxiety disorder, unspecified: Secondary | ICD-10-CM | POA: Insufficient documentation

## 2020-04-24 DIAGNOSIS — F329 Major depressive disorder, single episode, unspecified: Secondary | ICD-10-CM | POA: Insufficient documentation

## 2020-04-24 DIAGNOSIS — M1990 Unspecified osteoarthritis, unspecified site: Secondary | ICD-10-CM | POA: Diagnosis not present

## 2020-04-24 DIAGNOSIS — Z881 Allergy status to other antibiotic agents status: Secondary | ICD-10-CM | POA: Insufficient documentation

## 2020-04-24 DIAGNOSIS — I110 Hypertensive heart disease with heart failure: Secondary | ICD-10-CM | POA: Insufficient documentation

## 2020-04-24 DIAGNOSIS — Z7951 Long term (current) use of inhaled steroids: Secondary | ICD-10-CM | POA: Diagnosis not present

## 2020-04-24 DIAGNOSIS — Z79899 Other long term (current) drug therapy: Secondary | ICD-10-CM | POA: Diagnosis not present

## 2020-04-24 DIAGNOSIS — E7800 Pure hypercholesterolemia, unspecified: Secondary | ICD-10-CM | POA: Diagnosis not present

## 2020-04-24 HISTORY — PX: CATARACT EXTRACTION W/PHACO: SHX586

## 2020-04-24 SURGERY — PHACOEMULSIFICATION, CATARACT, WITH IOL INSERTION
Anesthesia: Monitor Anesthesia Care | Site: Eye | Laterality: Right

## 2020-04-24 SURGERY — PHACOEMULSIFICATION, CATARACT, WITH IOL INSERTION
Anesthesia: Topical | Laterality: Right

## 2020-04-24 MED ORDER — TETRACAINE HCL 0.5 % OP SOLN
1.0000 [drp] | OPHTHALMIC | Status: DC | PRN
  Administered 2020-04-24 (×3): 1 [drp] via OPHTHALMIC

## 2020-04-24 MED ORDER — FENTANYL CITRATE (PF) 100 MCG/2ML IJ SOLN
INTRAMUSCULAR | Status: DC | PRN
Start: 2020-04-24 — End: 2020-04-24
  Administered 2020-04-24 (×2): 50 ug via INTRAVENOUS

## 2020-04-24 MED ORDER — MIDAZOLAM HCL 2 MG/2ML IJ SOLN
INTRAMUSCULAR | Status: DC | PRN
  Administered 2020-04-24: 2 mg via INTRAVENOUS

## 2020-04-24 MED ORDER — OXYCODONE HCL 5 MG/5ML PO SOLN: 5.0000 mg | Freq: Once | ORAL | Status: DC | PRN

## 2020-04-24 MED ORDER — ARMC OPHTHALMIC DILATING DROPS
1.0000 "application " | OPHTHALMIC | Status: DC | PRN
  Administered 2020-04-24 (×3): 1 via OPHTHALMIC

## 2020-04-24 MED ORDER — LIDOCAINE HCL (PF) 2 % IJ SOLN
INTRAOCULAR | Status: DC | PRN
  Administered 2020-04-24: 1 mL

## 2020-04-24 MED ORDER — MOXIFLOXACIN HCL 0.5 % OP SOLN
OPHTHALMIC | Status: DC | PRN
  Administered 2020-04-24: 0.2 mL via OPHTHALMIC

## 2020-04-24 MED ORDER — EPINEPHRINE PF 1 MG/ML IJ SOLN
INTRAOCULAR | Status: DC | PRN
  Administered 2020-04-24: 41 mL via OPHTHALMIC

## 2020-04-24 MED ORDER — NA CHONDROIT SULF-NA HYALURON 40-17 MG/ML IO SOLN
INTRAOCULAR | Status: DC | PRN
  Administered 2020-04-24: 1 mL via INTRAOCULAR

## 2020-04-24 MED ORDER — LACTATED RINGERS IV SOLN: INTRAVENOUS | Status: DC

## 2020-04-24 MED ORDER — BRIMONIDINE TARTRATE-TIMOLOL 0.2-0.5 % OP SOLN
OPHTHALMIC | Status: DC | PRN
  Administered 2020-04-24: 1 [drp] via OPHTHALMIC

## 2020-04-24 MED ORDER — OXYCODONE HCL 5 MG PO TABS: 5.0000 mg | ORAL_TABLET | Freq: Once | ORAL | Status: DC | PRN

## 2020-04-24 SURGICAL SUPPLY — 21 items
CANNULA ANT/CHMB 27G (MISCELLANEOUS) ×2 IMPLANT
CANNULA ANT/CHMB 27GA (MISCELLANEOUS) ×4 IMPLANT
GLOVE SURG LX 8.0 MICRO (GLOVE) ×2
GLOVE SURG LX STRL 8.0 MICRO (GLOVE) ×1 IMPLANT
GLOVE SURG TRIUMPH 8.0 PF LTX (GLOVE) ×2 IMPLANT
GOWN STRL REUS W/ TWL LRG LVL3 (GOWN DISPOSABLE) ×2 IMPLANT
GOWN STRL REUS W/TWL LRG LVL3 (GOWN DISPOSABLE) ×4
LENS IOL TECNIS EYHANCE 19.5 ×1 IMPLANT
MARKER SKIN DUAL TIP RULER LAB (MISCELLANEOUS) ×2 IMPLANT
NDL FILTER BLUNT 18X1 1/2 (NEEDLE) ×1 IMPLANT
NEEDLE FILTER BLUNT 18X 1/2SAF (NEEDLE) ×1
NEEDLE FILTER BLUNT 18X1 1/2 (NEEDLE) ×1 IMPLANT
PACK EYE AFTER SURG (MISCELLANEOUS) ×2 IMPLANT
PACK OPTHALMIC (MISCELLANEOUS) ×2 IMPLANT
PACK PORFILIO (MISCELLANEOUS) ×2 IMPLANT
SUT ETHILON 10-0 CS-B-6CS-B-6 (SUTURE)
SUTURE EHLN 10-0 CS-B-6CS-B-6 (SUTURE) IMPLANT
SYR 3ML LL SCALE MARK (SYRINGE) ×2 IMPLANT
SYR TB 1ML LUER SLIP (SYRINGE) ×2 IMPLANT
WATER STERILE IRR 250ML POUR (IV SOLUTION) ×2 IMPLANT
WIPE NON LINTING 3.25X3.25 (MISCELLANEOUS) ×2 IMPLANT

## 2020-04-24 NOTE — Op Note (Signed)
PREOPERATIVE DIAGNOSIS:  Nuclear sclerotic cataract of the right eye.   POSTOPERATIVE DIAGNOSIS:  Cataract   OPERATIVE PROCEDURE:@   SURGEON:  Birder Robson, MD.   ANESTHESIA:  Anesthesiologist: Elgie Collard, MD CRNA: Mayme Genta, CRNA  1.      Managed anesthesia care. 2.      0.25ml of Shugarcaine was instilled in the eye following the paracentesis.   COMPLICATIONS:  None.   TECHNIQUE:   Stop and chop   DESCRIPTION OF PROCEDURE:  The patient was examined and consented in the preoperative holding area where the aforementioned topical anesthesia was applied to the right eye and then brought back to the Operating Room where the right eye was prepped and draped in the usual sterile ophthalmic fashion and a lid speculum was placed. A paracentesis was created with the side port blade and the anterior chamber was filled with viscoelastic. A near clear corneal incision was performed with the steel keratome. A continuous curvilinear capsulorrhexis was performed with a cystotome followed by the capsulorrhexis forceps. Hydrodissection and hydrodelineation were carried out with BSS on a blunt cannula. The lens was removed in a stop and chop  technique and the remaining cortical material was removed with the irrigation-aspiration handpiece. The capsular bag was inflated with viscoelastic and the Technis [id number]  lens was placed in the capsular bag without complication. The remaining viscoelastic was removed from the eye with the irrigation-aspiration handpiece. The wounds were hydrated. The anterior chamber was flushed with BSS and the eye was inflated to physiologic pressure. 0.35ml of Vigamox was placed in the anterior chamber. The wounds were found to be water tight. The eye was dressed with Combigan. The patient was given protective glasses to wear throughout the day and a shield with which to sleep tonight. The patient was also given drops with which to begin a drop regimen today and will follow-up  with me in one day. Implant Name Type Inv. Item Serial No. Manufacturer Lot No. LRB No. Used Action  TECNIS SIMPLICITY EYEHANCE IOL Intraocular Lens  [id number] Wynetta Emery [employer]  Right 1 Implanted   Procedure(s) with comments: CATARACT EXTRACTION PHACO AND INTRAOCULAR LENS PLACEMENT (IOC) RIGHT (Right) - 6.75 0:37.3  Electronically signed: Birder Robson 04/24/2020 11:43 AM

## 2020-04-24 NOTE — Anesthesia Procedure Notes (Signed)
Procedure Name: MAC Performed by: Krithi Bray, CRNA Pre-anesthesia Checklist: Patient identified, Emergency Drugs available, Suction available, Timeout performed and Patient being monitored Patient Re-evaluated:Patient Re-evaluated prior to induction Oxygen Delivery Method: Nasal cannula Placement Confirmation: positive ETCO2       

## 2020-04-24 NOTE — Anesthesia Postprocedure Evaluation (Signed)
Anesthesia Post Note  Patient: Nicole Sims  Procedure(s) Performed: CATARACT EXTRACTION PHACO AND INTRAOCULAR LENS PLACEMENT (IOC) RIGHT (Right Eye)     Patient location during evaluation: PACU Anesthesia Type: MAC Level of consciousness: awake and alert Pain management: pain level controlled Vital Signs Assessment: post-procedure vital signs reviewed and stable Respiratory status: spontaneous breathing Cardiovascular status: stable Postop Assessment: no headache Anesthetic complications: no   No complications documented.  Gillian Scarce

## 2020-04-24 NOTE — Transfer of Care (Signed)
Immediate Anesthesia Transfer of Care Note  Patient: Nicole Sims  Procedure(s) Performed: CATARACT EXTRACTION PHACO AND INTRAOCULAR LENS PLACEMENT (IOC) RIGHT (Right Eye)  Patient Location: PACU  Anesthesia Type: MAC  Level of Consciousness: awake, alert  and patient cooperative  Airway and Oxygen Therapy: Patient Spontanous Breathing and Patient connected to supplemental oxygen  Post-op Assessment: Post-op Vital signs reviewed, Patient's Cardiovascular Status Stable, Respiratory Function Stable, Patent Airway and No signs of Nausea or vomiting  Post-op Vital Signs: Reviewed and stable  Complications: No complications documented.

## 2020-04-24 NOTE — Anesthesia Preprocedure Evaluation (Signed)
Anesthesia Evaluation  Patient identified by MRN, date of birth, ID band Patient awake    Reviewed: Allergy & Precautions, H&P , NPO status , Patient's Chart, lab work & pertinent test results  History of Anesthesia Complications Negative for: history of anesthetic complications  Airway Mallampati: II  TM Distance: >3 FB Neck ROM: full    Dental   Upper partial.:   Pulmonary asthma , sleep apnea , former smoker,    Pulmonary exam normal        Cardiovascular hypertension, Normal cardiovascular exam Rhythm:regular Rate:Normal     Neuro/Psych  Headaches,    GI/Hepatic Neg liver ROS, Medicated,  Endo/Other  negative endocrine ROS  Renal/GU negative Renal ROS  negative genitourinary   Musculoskeletal   Abdominal   Peds  Hematology  (+) Blood dyscrasia, anemia ,   Anesthesia Other Findings   Reproductive/Obstetrics                             Anesthesia Physical Anesthesia Plan  ASA: II  Anesthesia Plan: MAC   Post-op Pain Management:    Induction:   PONV Risk Score and Plan: 2 and Treatment may vary due to age or medical condition  Airway Management Planned:   Additional Equipment:   Intra-op Plan:   Post-operative Plan:   Informed Consent: I have reviewed the patients History and Physical, chart, labs and discussed the procedure including the risks, benefits and alternatives for the proposed anesthesia with the patient or authorized representative who has indicated his/her understanding and acceptance.       Plan Discussed with:   Anesthesia Plan Comments:         Anesthesia Quick Evaluation

## 2020-04-24 NOTE — H&P (Signed)
All labs reviewed. Abnormal studies sent to patients PCP when indicated.  Previous H&P reviewed, patient examined, there are NO CHANGES.  Nicole Mclarty Porfilio9/9/202111:19 AM

## 2020-04-25 ENCOUNTER — Encounter: Payer: Self-pay | Admitting: Ophthalmology

## 2020-04-28 ENCOUNTER — Ambulatory Visit: Payer: Medicare Other

## 2020-04-30 ENCOUNTER — Ambulatory Visit: Payer: Medicare Other

## 2020-05-05 ENCOUNTER — Ambulatory Visit: Payer: Medicare Other

## 2020-05-06 ENCOUNTER — Encounter: Payer: Self-pay | Admitting: Internal Medicine

## 2020-05-07 ENCOUNTER — Ambulatory Visit: Payer: Medicare Other

## 2020-05-09 ENCOUNTER — Ambulatory Visit: Payer: Medicare Other | Admitting: Internal Medicine

## 2020-05-12 ENCOUNTER — Ambulatory Visit: Payer: Medicare Other

## 2020-05-13 DIAGNOSIS — R202 Paresthesia of skin: Secondary | ICD-10-CM | POA: Diagnosis not present

## 2020-05-14 ENCOUNTER — Ambulatory Visit: Payer: Medicare Other

## 2020-05-16 DIAGNOSIS — M47812 Spondylosis without myelopathy or radiculopathy, cervical region: Secondary | ICD-10-CM | POA: Diagnosis not present

## 2020-06-05 ENCOUNTER — Ambulatory Visit (INDEPENDENT_AMBULATORY_CARE_PROVIDER_SITE_OTHER): Payer: Medicare Other

## 2020-06-05 ENCOUNTER — Telehealth: Payer: Self-pay

## 2020-06-05 VITALS — Ht 65.0 in | Wt 235.0 lb

## 2020-06-05 DIAGNOSIS — Z760 Encounter for issue of repeat prescription: Secondary | ICD-10-CM

## 2020-06-05 DIAGNOSIS — Z0000 Encounter for general adult medical examination without abnormal findings: Secondary | ICD-10-CM | POA: Diagnosis not present

## 2020-06-05 MED ORDER — BUDESONIDE-FORMOTEROL FUMARATE 80-4.5 MCG/ACT IN AERO: 2.0000 | INHALATION_SPRAY | Freq: Two times a day (BID) | RESPIRATORY_TRACT | 0 refills | Status: DC

## 2020-06-05 NOTE — Patient Instructions (Addendum)
Nicole Sims , Thank you for taking time to come for your Medicare Wellness Visit. I appreciate your ongoing commitment to your health goals. Please review the following plan we discussed and let me know if I can assist you in the future.   These are the goals we discussed: Goals   None     This is a list of the screening recommended for you and due dates:  Health Maintenance  Topic Date Due  . Tetanus Vaccine  Never done  . Mammogram  02/22/2020  . Flu Shot  03/16/2020  . Pneumonia vaccines (2 of 2 - PCV13) 06/25/2020  . Colon Cancer Screening  10/19/2028  . DEXA scan (bone density measurement)  Completed  . COVID-19 Vaccine  Completed  .  Hepatitis C: One time screening is recommended by Center for Disease Control  (CDC) for  adults born from [birth year] through 1966.   Completed   Immunizations Immunization History  Administered Date(s) Administered  . Fluad Quad(high Dose 65+) 06/26/2019  . Influenza, High Dose Seasonal PF 09/30/2017  . Influenza,inj,Quad PF,6+ Mos 06/18/2013  . Influenza-Unspecified 04/30/2014, 06/21/2016  . PFIZER SARS-COV-2 Vaccination 12/19/2019, 01/16/2020  . Pneumococcal Polysaccharide-23 06/26/2019   Keep all routine maintenance appointments.   Follow up virtual visit 06/27/20 @ 4:00  Advanced directives: End of life planning; Advance aging; Advanced directives discussed.  Copy of current HCPOA/Living Will requested.    Conditions/risks identified: none new.   Follow up in one year for your annual wellness visit.    Preventive Care 64 Years and Older, Female Preventive care refers to lifestyle choices and visits with your health care provider that can promote health and wellness. What does preventive care include?  A yearly physical exam. This is also called an annual well check.  Dental exams once or twice a year.  Routine eye exams. Ask your health care provider how often you should have your eyes checked.  Personal lifestyle choices,  including:  Daily care of your teeth and gums.  Regular physical activity.  Eating a healthy diet.  Avoiding tobacco and drug use.  Limiting alcohol use.  Practicing safe sex.  Taking low-dose aspirin every day.  Taking vitamin and mineral supplements as recommended by your health care provider. What happens during an annual well check? The services and screenings done by your health care provider during your annual well check will depend on your age, overall health, lifestyle risk factors, and family history of disease. Counseling  Your health care provider may ask you questions about your:  Alcohol use.  Tobacco use.  Drug use.  Emotional well-being.  Home and relationship well-being.  Sexual activity.  Eating habits.  History of falls.  Memory and ability to understand (cognition).  Work and work Statistician.  Reproductive health. Screening  You may have the following tests or measurements:  Height, weight, and BMI.  Blood pressure.  Lipid and cholesterol levels. These may be checked every 5 years, or more frequently if you are over 61 years old.  Skin check.  Lung cancer screening. You may have this screening every year starting at age 57 if you have a 30-pack-year history of smoking and currently smoke or have quit within the past 15 years.  Fecal occult blood test (FOBT) of the stool. You may have this test every year starting at age 20.  Flexible sigmoidoscopy or colonoscopy. You may have a sigmoidoscopy every 5 years or a colonoscopy every 10 years starting at age 53.  Hepatitis C  blood test.  Hepatitis B blood test.  Sexually transmitted disease (STD) testing.  Diabetes screening. This is done by checking your blood sugar (glucose) after you have not eaten for a while (fasting). You may have this done every 1-3 years.  Bone density scan. This is done to screen for osteoporosis. You may have this done starting at age 16.  Mammogram. This  may be done every 1-2 years. Talk to your health care provider about how often you should have regular mammograms. Talk with your health care provider about your test results, treatment options, and if necessary, the need for more tests. Vaccines  Your health care provider may recommend certain vaccines, such as:  Influenza vaccine. This is recommended every year.  Tetanus, diphtheria, and acellular pertussis (Tdap, Td) vaccine. You may need a Td booster every 10 years.  Zoster vaccine. You may need this after age 69.  Pneumococcal 13-valent conjugate (PCV13) vaccine. One dose is recommended after age 56.  Pneumococcal polysaccharide (PPSV23) vaccine. One dose is recommended after age 5. Talk to your health care provider about which screenings and vaccines you need and how often you need them. This information is not intended to replace advice given to you by your health care provider. Make sure you discuss any questions you have with your health care provider. Document Released: 08/29/2015 Document Revised: 04/21/2016 Document Reviewed: 06/03/2015 Elsevier Interactive Patient Education  2017 [place] Prevention in the Home Falls can cause injuries. They can happen to people of all ages. There are many things you can do to make your home safe and to help prevent falls. What can I do on the outside of my home?  Regularly fix the edges of walkways and driveways and fix any cracks.  Remove anything that might make you trip as you walk through a door, such as a raised step or threshold.  Trim any bushes or trees on the path to your home.  Use bright outdoor lighting.  Clear any walking paths of anything that might make someone trip, such as rocks or tools.  Regularly check to see if handrails are loose or broken. Make sure that both sides of any steps have handrails.  Any raised decks and porches should have guardrails on the edges.  Have any leaves, snow, or ice cleared  regularly.  Use sand or salt on walking paths during winter.  Clean up any spills in your garage right away. This includes oil or grease spills. What can I do in the bathroom?  Use night lights.  Install grab bars by the toilet and in the tub and shower. Do not use towel bars as grab bars.  Use non-skid mats or decals in the tub or shower.  If you need to sit down in the shower, use a plastic, non-slip stool.  Keep the floor dry. Clean up any water that spills on the floor as soon as it happens.  Remove soap buildup in the tub or shower regularly.  Attach bath mats securely with double-sided non-slip rug tape.  Do not have throw rugs and other things on the floor that can make you trip. What can I do in the bedroom?  Use night lights.  Make sure that you have a light by your bed that is easy to reach.  Do not use any sheets or blankets that are too big for your bed. They should not hang down onto the floor.  Have a firm chair that has side arms. You  can use this for support while you get dressed.  Do not have throw rugs and other things on the floor that can make you trip. What can I do in the kitchen?  Clean up any spills right away.  Avoid walking on wet floors.  Keep items that you use a lot in easy-to-reach places.  If you need to reach something above you, use a strong step stool that has a grab bar.  Keep electrical cords out of the way.  Do not use floor polish or wax that makes floors slippery. If you must use wax, use non-skid floor wax.  Do not have throw rugs and other things on the floor that can make you trip. What can I do with my stairs?  Do not leave any items on the stairs.  Make sure that there are handrails on both sides of the stairs and use them. Fix handrails that are broken or loose. Make sure that handrails are as long as the stairways.  Check any carpeting to make sure that it is firmly attached to the stairs. Fix any carpet that is loose  or worn.  Avoid having throw rugs at the top or bottom of the stairs. If you do have throw rugs, attach them to the floor with carpet tape.  Make sure that you have a light switch at the top of the stairs and the bottom of the stairs. If you do not have them, ask someone to add them for you. What else can I do to help prevent falls?  Wear shoes that:  Do not have high heels.  Have rubber bottoms.  Are comfortable and fit you well.  Are closed at the toe. Do not wear sandals.  If you use a stepladder:  Make sure that it is fully opened. Do not climb a closed stepladder.  Make sure that both sides of the stepladder are locked into place.  Ask someone to hold it for you, if possible.  Clearly mark and make sure that you can see:  Any grab bars or handrails.  First and last steps.  Where the edge of each step is.  Use tools that help you move around (mobility aids) if they are needed. These include:  Canes.  Walkers.  Scooters.  Crutches.  Turn on the lights when you go into a dark area. Replace any light bulbs as soon as they burn out.  Set up your furniture so you have a clear path. Avoid moving your furniture around.  If any of your floors are uneven, fix them.  If there are any pets around you, be aware of where they are.  Review your medicines with your doctor. Some medicines can make you feel dizzy. This can increase your chance of falling. Ask your doctor what other things that you can do to help prevent falls. This information is not intended to replace advice given to you by your health care provider. Make sure you discuss any questions you have with your health care provider. Document Released: 05/29/2009 Document Revised: 01/08/2016 Document Reviewed: 09/06/2014 Elsevier Interactive Patient Education  2017 [employer].

## 2020-06-05 NOTE — Progress Notes (Addendum)
Subjective:   Nicole Sims is a 69 y.o. female who presents for an Initial Medicare Annual Wellness Visit.  Review of Systems    No ROS.  Medicare Wellness Virtual Visit.  Cardiac Risk Factors include: advanced age (>35men, >81 women);hypertension     Objective:    Today's Vitals   06/05/20 1331  Weight: 235 lb (106.6 kg)  Height: 5\' 5"  (1.651 m)   Body mass index is 39.11 kg/m.  Advanced Directives 06/05/2020 04/24/2020 11/07/2019 06/25/2019 06/25/2019 06/18/2019 06/15/2019  Does Patient Have a Medical Advance Directive? Yes Yes Yes Yes Yes Yes Yes  Type of Paramedic of [place];Living will [place];Living will - [place];Living will [place];Living will - [place];Living will  Does patient want to make changes to medical advance directive? No - Patient declined No - Patient declined - No - Patient declined No - Patient declined - No - Patient declined  Copy of [place] in Chart? No - copy requested No - copy requested - Yes - validated most recent copy scanned in chart (See row information) No - copy requested - No - copy requested  Would patient like information on creating a medical advance directive? - - - - - - -    Current Medications (verified) Outpatient Encounter Medications as of 06/05/2020  Medication Sig  . albuterol (VENTOLIN HFA) 108 (90 Base) MCG/ACT inhaler Inhale 2 puffs into the lungs every 6 (six) hours as needed for wheezing or shortness of breath.  . Ascorbic Acid (VITAMIN C) 1000 MG tablet Take 1,000 mg by mouth daily.  Marland Kitchen aspirin EC 325 MG tablet Take 325 mg by mouth daily.   . budesonide-formoterol (SYMBICORT) 80-4.5 MCG/ACT inhaler Inhale 2 puffs into the lungs 2 (two) times daily.  . fluticasone (FLONASE) 50 MCG/ACT nasal spray Place 2 sprays into both nostrils daily.  . furosemide (LASIX) 20 MG tablet Take 1 tablet (20 mg) by mouth  once daily as needed for weight gain of 3 lbs or more overnight  . gabapentin (NEURONTIN) 300 MG capsule Take 1 capsule (300 mg total) by mouth at bedtime as needed. (Patient taking differently: Take 300-600 mg by mouth at bedtime as needed. )  . metoprolol succinate (TOPROL-XL) 100 MG 24 hr tablet Take 100 mg by mouth daily.   . montelukast (SINGULAIR) 10 MG tablet TAKE 1 TABLET BY MOUTH AT  BEDTIME (Patient taking differently: Take 10 mg by mouth at bedtime. )  . Multiple Vitamin (MULTIVITAMIN) tablet Take 1 tablet by mouth daily.  . Multiple Vitamins-Minerals (VISION VITAMINS PO) Take 1 tablet by mouth.  . nystatin (MYCOSTATIN) 100000 UNIT/ML suspension Take 5 mLs (500,000 Units total) by mouth 3 (three) times daily as needed.  . ondansetron (ZOFRAN) 4 MG tablet Take 1 tablet (4 mg total) by mouth 2 (two) times daily as needed for nausea or vomiting.  . potassium chloride (KLOR-CON) 10 MEQ tablet Take 1 tablet (10 meq) by mouth once daily as needed only when taking lasix (furosemide)  . RIBOFLAVIN PO Take 200 mg by mouth in the morning and at bedtime.  . rosuvastatin (CRESTOR) 10 MG tablet TAKE 1 TABLET BY MOUTH  DAILY  . sucralfate (CARAFATE) 1 g tablet Take 1 g by mouth daily as needed (GI sympotms).   . venlafaxine XR (EFFEXOR-XR) 150 MG 24 hr capsule TAKE 1 CAPSULE BY MOUTH  DAILY WITH BREAKFAST  . vitamin B-12 (CYANOCOBALAMIN) 1000 MCG  tablet Take 1,000 mcg by mouth daily.  . Calcium Carb-Cholecalciferol (CALCIUM 500 +D) 500-400 MG-UNIT TABS Take 1 tablet by mouth daily. (Patient not taking: Reported on 06/05/2020)  . Cholecalciferol (VITAMIN D-3) 1000 units CAPS Take 1,000 Units by mouth daily.  (Patient not taking: Reported on 06/05/2020)  . magnesium oxide (MAG-OX) 400 MG tablet Take 400 mg daily by mouth. (Patient not taking: Reported on 06/05/2020)  . RABEprazole (ACIPHEX) 20 MG tablet Take 20 mg by mouth 2 (two) times daily. (Patient not taking: Reported on 06/05/2020)   No  facility-administered encounter medications on file as of 06/05/2020.    Allergies (verified) Augmentin [amoxicillin-pot clavulanate], Bactrim [sulfamethoxazole-trimethoprim], Doxycycline, Hydrocodone-acetaminophen, Levaquin [levofloxacin], Pseudoephedrine, and Shellfish allergy   History: Past Medical History:  Diagnosis Date  . Anemia   . Anxiety   . Asthma   . Cataract cortical, senile   . CHF (congestive heart failure) ([place])   . Chronic headaches   . Diastolic heart failure ([place])   . Diverticulosis   . Environmental allergies   . GERD (gastroesophageal reflux disease)   . Heart murmur   . Hyperglycemia   . Hyperlipidemia   . Hypertension   . Obesity   . Osteoarthritis   . Palpitations   . Restless leg syndrome   . Sleep apnea   . Thrombocytosis   . Urinary incontinence    mixed  . Venous insufficiency   . Vitamin D deficiency    Past Surgical History:  Procedure Laterality Date  . BLADDER SURGERY     x2   [place] [facility] [place]  . BREAST CYST ASPIRATION Bilateral 2005   approximate year  . CARDIAC CATHETERIZATION     Nehemiah Massed  . CATARACT EXTRACTION W/PHACO Right 04/24/2020   Procedure: CATARACT EXTRACTION PHACO AND INTRAOCULAR LENS PLACEMENT ([place]) RIGHT;  Surgeon: Birder Robson, MD;  Location: [facility];  Service: Ophthalmology;  Laterality: Right;  6.75 0:37.3  . CERVICAL CONE BIOPSY     CIS  . CHOLECYSTECTOMY    . COLONOSCOPY WITH PROPOFOL N/A 07/19/2016   Procedure: COLONOSCOPY WITH PROPOFOL;  Surgeon: Manya Silvas, MD;  Location: [facility] ENDOSCOPY;  Service: Endoscopy;  Laterality: N/A;  . COLONOSCOPY WITH PROPOFOL N/A 10/20/2018   Procedure: COLONOSCOPY WITH PROPOFOL;  Surgeon: Lollie Sails, MD;  Location: [facility] ENDOSCOPY;  Service: Endoscopy;  Laterality: N/A;  . ESOPHAGOGASTRODUODENOSCOPY (EGD) WITH PROPOFOL N/A 07/19/2016   Procedure: ESOPHAGOGASTRODUODENOSCOPY (EGD) WITH PROPOFOL;  Surgeon: Manya Silvas, MD;  Location[facility]  ENDOSCOPY;  Service: Endoscopy;  Laterality: N/A;  . FLEXIBLE SIGMOIDOSCOPY    . KNEE ARTHROSCOPY  08/13/08  . knee replacement and revision     left  . RECTOCELE REPAIR    . RIGHT/LEFT HEART CATH AND CORONARY ANGIOGRAPHY Bilateral 04/10/2018   Procedure: RIGHT/LEFT HEART CATH AND CORONARY ANGIOGRAPHY;  Surgeon: Wellington Hampshire, MD;  Location: [facility] CV LAB;  Service: Cardiovascular;  Laterality: Bilateral;  . ROTATOR CUFF REPAIR     bilateral  . SHOULDER SURGERY  11/17/05  . TONSILLECTOMY  1962  . VAGINAL HYSTERECTOMY  1974   abnormal pap and carcinoma in situ   Family History  Problem Relation Age of Onset  . Diabetes Mellitus II Father   . Thyroid disease Father   . Alzheimer's disease Father   . Breast cancer Maternal Aunt   . Hypertension Mother   . Heart Problems Brother   . Leukemia Maternal Aunt   . Alzheimer's disease Paternal Aunt   . Alzheimer's disease Paternal  Uncle   . Alzheimer's disease Paternal Uncle   . Alzheimer's disease Paternal Uncle   . Alzheimer's disease Paternal Aunt   . Colon cancer Neg Hx   . Kidney cancer Neg Hx   . Bladder Cancer Neg Hx    Social History   Socioeconomic History  . Marital status: Married    Spouse name: Not on file  . Number of children: Not on file  . Years of education: Not on file  . Highest education level: Not on file  Occupational History  . Occupation: retired  Tobacco Use  . Smoking status: Former Smoker    Packs/day: 1.00    Years: 15.00    Pack years: 15.00    Quit date: 08/17/1983    Years since quitting: 36.8  . Smokeless tobacco: Never Used  Vaping Use  . Vaping Use: Never used  Substance and Sexual Activity  . Alcohol use: Yes    Alcohol/week: 0.0 standard drinks    Comment: rarely  . Drug use: No  . Sexual activity: Not on file  Other Topics Concern  . Not on file  Social History Narrative   Lives at home with husband   Social Determinants of Health   Financial Resource Strain:   .  Difficulty of Paying Living Expenses: Not on file  Food Insecurity:   . Worried About Charity fundraiser in the Last Year: Not on file  . Ran Out of Food in the Last Year: Not on file  Transportation Needs:   . Lack of Transportation (Medical): Not on file  . Lack of Transportation (Non-Medical): Not on file  Physical Activity:   . Days of Exercise per Week: Not on file  . Minutes of Exercise per Session: Not on file  Stress:   . Feeling of Stress : Not on file  Social Connections:   . Frequency of Communication with Friends and Family: Not on file  . Frequency of Social Gatherings with Friends and Family: Not on file  . Attends Religious Services: Not on file  . Active Member of Clubs or Organizations: Not on file  . Attends Archivist Meetings: Not on file  . Marital Status: Not on file    Tobacco Counseling Counseling given: Not Answered   Clinical Intake:  Pre-visit preparation completed: Yes        Diabetes: No  How often do you need to have someone help you when you read instructions, pamphlets, or other written materials from your doctor or pharmacy?: 1 - Never  Interpreter Needed?: No      Activities of Daily Living In your present state of health, do you have any difficulty performing the following activities: 06/05/2020 04/24/2020  Hearing? N N  Vision? N N  Difficulty concentrating or making decisions? N N  Walking or climbing stairs? Y N  Comment Chronic leg weakness -  Dressing or bathing? N N  Doing errands, shopping? N -  Preparing Food and eating ? N -  Using the Toilet? N -  In the past six months, have you accidently leaked urine? N -  Do you have problems with loss of bowel control? N -  Managing your Medications? N -  Managing your Finances? N -  Housekeeping or managing your Housekeeping? N -  Some recent data might be hidden    Patient Care Team: Einar Pheasant, MD as PCP - General (Internal Medicine) Wellington Hampshire, MD  as PCP - Cardiology (Cardiology)  Indicate  any recent Medical Services you may have received from other than [facility] providers in the past year (date may be approximate).     Assessment:   This is a routine wellness examination for [place].  I connected with Kalin today by telephone and verified that I am speaking with the correct person using two identifiers. Location patient: home Location provider: work Persons participating in the virtual visit: patient, Marine scientist.    I discussed the limitations, risks, security and privacy concerns of performing an evaluation and management service by telephone and the availability of in person appointments. The patient expressed understanding and verbally consented to this telephonic visit.    Interactive audio and video telecommunications were attempted between this provider and patient, however failed, due to patient having technical difficulties OR patient did not have access to video capability.  We continued and completed visit with audio only.  Some vital signs may be absent or patient reported.   Hearing/Vision screen  Hearing Screening   125Hz  250Hz  500Hz  1000Hz  2000Hz  3000Hz  4000Hz  6000Hz  8000Hz   Right ear:           Left ear:           Comments: Patient is able to hear conversational tones without difficulty.  No issues reported.  Vision Screening Comments: Wears corrective lenses Visual acuity not assessed, virtual visit.  They have seen their ophthalmologist in the last 12 months. Cataract R eye extracted  Dietary issues and exercise activities discussed: Current Exercise Habits: The patient does not participate in regular exercise at present Healthy diet Good water intake  Goals   None    Depression Screen PHQ 2/9 Scores 06/05/2020 02/15/2020 06/21/2019 12/28/2016 06/07/2016 12/10/2014  PHQ - 2 Score 0 0 0 2 0 0  PHQ- 9 Score - - - 10 - -    Fall Risk Fall Risk  06/05/2020 02/15/2020 01/19/2019 09/30/2017 06/07/2016  Falls in the  past year? 0 1 0 No No  Number falls in past yr: 0 1 - - -  Injury with Fall? - 0 - - -  Follow up Falls evaluation completed Falls evaluation completed - - -   Handrails in use when climbing stairs?Yes Home free of loose throw rugs in walkways, pet beds, electrical cords, etc? Yes  Adequate lighting in your home to reduce risk of falls? Yes   ASSISTIVE DEVICES UTILIZED TO PREVENT FALLS: Use of a cane, walker or w/c? No   TIMED UP AND GO: Was the test performed? No . Virtual visit.   Cognitive Function:  Patient is alert and oriented x3.  Denies difficulty focusing, memory loss, making decisions.  Enjoys brain challenging games and activities.      Immunizations Immunization History  Administered Date(s) Administered  . Fluad Quad(high Dose 65+) 06/26/2019  . Influenza, High Dose Seasonal PF 09/30/2017  . Influenza,inj,Quad PF,6+ Mos 06/18/2013  . Influenza-Unspecified 04/30/2014, 06/21/2016  . PFIZER SARS-COV-2 Vaccination 12/19/2019, 01/16/2020  . Pneumococcal Polysaccharide-23 06/26/2019   TDAP status: Due, Education has been provided regarding the importance of this vaccine. Advised may receive this vaccine at local pharmacy or Health Dept. Aware to provide a copy of the vaccination record if obtained from local pharmacy or Health Dept. Verbalized acceptance and understanding. Deferred.   Health Maintenance  Topic Date Due  . TETANUS/TDAP  Never done  . MAMMOGRAM  02/22/2020  . INFLUENZA VACCINE  03/16/2020  . PNA vac Low Risk Adult (2 of 2 - PCV13) 06/25/2020  . COLONOSCOPY  10/19/2028  .  DEXA SCAN  Completed  . COVID-19 Vaccine  Completed  . Hepatitis C Screening  Completed    Health Maintenance Health Maintenance Due  Topic Date Due  . TETANUS/TDAP  Never done  . MAMMOGRAM  02/22/2020  . INFLUENZA VACCINE  03/16/2020    Dental Screening: Recommended annual dental exams for proper oral hygiene.  Community Resource Referral / Chronic Care Management: CRR  required this visit?  No   CCM required this visit?  No      Plan:   Keep all routine maintenance appointments.   Follow up virtual visit 06/27/20 @ 4:00  Mammogram- number provided for scheduling at [facility]  I have personally reviewed and noted the following in the patient's chart:   . Medical and social history . Use of alcohol, tobacco or illicit drugs  . Current medications and supplements . Functional ability and status . Nutritional status . Physical activity . Advanced directives . List of other physicians . Hospitalizations, surgeries, and ER visits in previous 12 months . Vitals . Screenings to include cognitive, depression, and falls . Referrals and appointments  In addition, I have reviewed and discussed with patient certain preventive protocols, quality metrics, and best practice recommendations. A written personalized care plan for preventive services as well as general preventive health recommendations were provided to patient via mychart.     Varney Biles, LPN   [id number]    Reviewed above information.  Agree with assessment and plan.   Dr Nicki Reaper

## 2020-06-05 NOTE — Telephone Encounter (Signed)
Medication refill

## 2020-06-06 DIAGNOSIS — M5412 Radiculopathy, cervical region: Secondary | ICD-10-CM | POA: Diagnosis not present

## 2020-06-06 DIAGNOSIS — M5416 Radiculopathy, lumbar region: Secondary | ICD-10-CM | POA: Diagnosis not present

## 2020-06-06 DIAGNOSIS — M5136 Other intervertebral disc degeneration, lumbar region: Secondary | ICD-10-CM | POA: Diagnosis not present

## 2020-06-06 DIAGNOSIS — M5030 Other cervical disc degeneration, unspecified cervical region: Secondary | ICD-10-CM | POA: Diagnosis not present

## 2020-06-13 ENCOUNTER — Other Ambulatory Visit: Payer: Self-pay

## 2020-06-13 DIAGNOSIS — D72829 Elevated white blood cell count, unspecified: Secondary | ICD-10-CM

## 2020-06-16 ENCOUNTER — Ambulatory Visit: Payer: Medicare Other | Admitting: Oncology

## 2020-06-16 ENCOUNTER — Inpatient Hospital Stay: Payer: Medicare Other | Attending: Oncology

## 2020-06-16 ENCOUNTER — Other Ambulatory Visit: Payer: Self-pay

## 2020-06-16 DIAGNOSIS — E611 Iron deficiency: Secondary | ICD-10-CM | POA: Diagnosis not present

## 2020-06-16 DIAGNOSIS — G4730 Sleep apnea, unspecified: Secondary | ICD-10-CM | POA: Diagnosis not present

## 2020-06-16 DIAGNOSIS — D72829 Elevated white blood cell count, unspecified: Secondary | ICD-10-CM | POA: Diagnosis not present

## 2020-06-16 DIAGNOSIS — Z885 Allergy status to narcotic agent status: Secondary | ICD-10-CM | POA: Diagnosis not present

## 2020-06-16 DIAGNOSIS — D75839 Thrombocytosis, unspecified: Secondary | ICD-10-CM | POA: Insufficient documentation

## 2020-06-16 DIAGNOSIS — Z87891 Personal history of nicotine dependence: Secondary | ICD-10-CM | POA: Insufficient documentation

## 2020-06-16 DIAGNOSIS — K219 Gastro-esophageal reflux disease without esophagitis: Secondary | ICD-10-CM | POA: Diagnosis not present

## 2020-06-16 DIAGNOSIS — Z803 Family history of malignant neoplasm of breast: Secondary | ICD-10-CM | POA: Insufficient documentation

## 2020-06-16 DIAGNOSIS — Z818 Family history of other mental and behavioral disorders: Secondary | ICD-10-CM | POA: Diagnosis not present

## 2020-06-16 DIAGNOSIS — R700 Elevated erythrocyte sedimentation rate: Secondary | ICD-10-CM | POA: Diagnosis not present

## 2020-06-16 DIAGNOSIS — R0602 Shortness of breath: Secondary | ICD-10-CM | POA: Diagnosis not present

## 2020-06-16 DIAGNOSIS — Z881 Allergy status to other antibiotic agents status: Secondary | ICD-10-CM | POA: Insufficient documentation

## 2020-06-16 DIAGNOSIS — M1990 Unspecified osteoarthritis, unspecified site: Secondary | ICD-10-CM | POA: Insufficient documentation

## 2020-06-16 DIAGNOSIS — Z833 Family history of diabetes mellitus: Secondary | ICD-10-CM | POA: Diagnosis not present

## 2020-06-16 DIAGNOSIS — I110 Hypertensive heart disease with heart failure: Secondary | ICD-10-CM | POA: Insufficient documentation

## 2020-06-16 DIAGNOSIS — Z9049 Acquired absence of other specified parts of digestive tract: Secondary | ICD-10-CM | POA: Insufficient documentation

## 2020-06-16 DIAGNOSIS — Z8349 Family history of other endocrine, nutritional and metabolic diseases: Secondary | ICD-10-CM | POA: Insufficient documentation

## 2020-06-16 DIAGNOSIS — Z79899 Other long term (current) drug therapy: Secondary | ICD-10-CM | POA: Insufficient documentation

## 2020-06-16 DIAGNOSIS — E785 Hyperlipidemia, unspecified: Secondary | ICD-10-CM | POA: Diagnosis not present

## 2020-06-16 DIAGNOSIS — Z880 Allergy status to penicillin: Secondary | ICD-10-CM | POA: Diagnosis not present

## 2020-06-16 DIAGNOSIS — Z806 Family history of leukemia: Secondary | ICD-10-CM | POA: Diagnosis not present

## 2020-06-16 DIAGNOSIS — Z8249 Family history of ischemic heart disease and other diseases of the circulatory system: Secondary | ICD-10-CM | POA: Diagnosis not present

## 2020-06-16 DIAGNOSIS — I5032 Chronic diastolic (congestive) heart failure: Secondary | ICD-10-CM | POA: Diagnosis not present

## 2020-06-16 LAB — CBC WITH DIFFERENTIAL/PLATELET
Abs Immature Granulocytes: 0.02 10*3/uL (ref 0.00–0.07)
Basophils Absolute: 0.1 10*3/uL (ref 0.0–0.1)
Basophils Relative: 1 %
Eosinophils Absolute: 0.3 10*3/uL (ref 0.0–0.5)
Eosinophils Relative: 3 %
HCT: 38.3 % (ref 36.0–46.0)
Hemoglobin: 12.2 g/dL (ref 12.0–15.0)
Immature Granulocytes: 0 %
Lymphocytes Relative: 41 %
Lymphs Abs: 3.5 10*3/uL (ref 0.7–4.0)
MCH: 28.4 pg (ref 26.0–34.0)
MCHC: 31.9 g/dL (ref 30.0–36.0)
MCV: 89.1 fL (ref 80.0–100.0)
Monocytes Absolute: 0.7 10*3/uL (ref 0.1–1.0)
Monocytes Relative: 8 %
Neutro Abs: 4 10*3/uL (ref 1.7–7.7)
Neutrophils Relative %: 47 %
Platelets: 430 10*3/uL — ABNORMAL HIGH (ref 150–400)
RBC: 4.3 MIL/uL (ref 3.87–5.11)
RDW: 13.5 % (ref 11.5–15.5)
WBC: 8.6 10*3/uL (ref 4.0–10.5)
nRBC: 0 % (ref 0.0–0.2)

## 2020-06-16 LAB — FERRITIN: Ferritin: 59 ng/mL (ref 11–307)

## 2020-06-16 LAB — IRON AND TIBC
Iron: 78 ug/dL (ref 28–170)
Saturation Ratios: 20 % (ref 10.4–31.8)
TIBC: 382 ug/dL (ref 250–450)
UIBC: 304 ug/dL

## 2020-06-18 ENCOUNTER — Encounter: Payer: Self-pay | Admitting: Oncology

## 2020-06-18 ENCOUNTER — Inpatient Hospital Stay: Payer: Medicare Other | Admitting: Oncology

## 2020-06-18 VITALS — BP 161/84 | HR 87 | Temp 97.4°F | Resp 18 | Wt 240.4 lb

## 2020-06-18 DIAGNOSIS — Z885 Allergy status to narcotic agent status: Secondary | ICD-10-CM | POA: Diagnosis not present

## 2020-06-18 DIAGNOSIS — D72829 Elevated white blood cell count, unspecified: Secondary | ICD-10-CM | POA: Diagnosis not present

## 2020-06-18 DIAGNOSIS — Z9049 Acquired absence of other specified parts of digestive tract: Secondary | ICD-10-CM | POA: Diagnosis not present

## 2020-06-18 DIAGNOSIS — Z881 Allergy status to other antibiotic agents status: Secondary | ICD-10-CM | POA: Diagnosis not present

## 2020-06-18 DIAGNOSIS — R0602 Shortness of breath: Secondary | ICD-10-CM | POA: Diagnosis not present

## 2020-06-18 DIAGNOSIS — Z806 Family history of leukemia: Secondary | ICD-10-CM | POA: Diagnosis not present

## 2020-06-18 DIAGNOSIS — Z803 Family history of malignant neoplasm of breast: Secondary | ICD-10-CM | POA: Diagnosis not present

## 2020-06-18 DIAGNOSIS — K219 Gastro-esophageal reflux disease without esophagitis: Secondary | ICD-10-CM | POA: Diagnosis not present

## 2020-06-18 DIAGNOSIS — M1990 Unspecified osteoarthritis, unspecified site: Secondary | ICD-10-CM | POA: Diagnosis not present

## 2020-06-18 DIAGNOSIS — E611 Iron deficiency: Secondary | ICD-10-CM | POA: Diagnosis not present

## 2020-06-18 DIAGNOSIS — Z833 Family history of diabetes mellitus: Secondary | ICD-10-CM | POA: Diagnosis not present

## 2020-06-18 DIAGNOSIS — G4730 Sleep apnea, unspecified: Secondary | ICD-10-CM | POA: Diagnosis not present

## 2020-06-18 DIAGNOSIS — Z8349 Family history of other endocrine, nutritional and metabolic diseases: Secondary | ICD-10-CM | POA: Diagnosis not present

## 2020-06-18 DIAGNOSIS — D75839 Thrombocytosis, unspecified: Secondary | ICD-10-CM

## 2020-06-18 DIAGNOSIS — I110 Hypertensive heart disease with heart failure: Secondary | ICD-10-CM | POA: Diagnosis not present

## 2020-06-18 DIAGNOSIS — Z79899 Other long term (current) drug therapy: Secondary | ICD-10-CM | POA: Diagnosis not present

## 2020-06-18 DIAGNOSIS — I5032 Chronic diastolic (congestive) heart failure: Secondary | ICD-10-CM | POA: Diagnosis not present

## 2020-06-18 DIAGNOSIS — Z87891 Personal history of nicotine dependence: Secondary | ICD-10-CM | POA: Diagnosis not present

## 2020-06-18 DIAGNOSIS — Z880 Allergy status to penicillin: Secondary | ICD-10-CM | POA: Diagnosis not present

## 2020-06-18 DIAGNOSIS — R700 Elevated erythrocyte sedimentation rate: Secondary | ICD-10-CM | POA: Diagnosis not present

## 2020-06-18 DIAGNOSIS — E785 Hyperlipidemia, unspecified: Secondary | ICD-10-CM | POA: Diagnosis not present

## 2020-06-18 DIAGNOSIS — Z8249 Family history of ischemic heart disease and other diseases of the circulatory system: Secondary | ICD-10-CM | POA: Diagnosis not present

## 2020-06-18 NOTE — Progress Notes (Signed)
Hematology/Oncology follow up note [facility] [phone] [phone]   Patient Care Team: Einar Pheasant, MD as PCP - General (Internal Medicine) Wellington Hampshire, MD as PCP - Cardiology (Cardiology) Earlie Server, MD as Consulting Physician (Hematology and Oncology)  REFERRING PROVIDER: Einar Pheasant, MD CHIEF COMPLAINTS/REASON FOR VISIT:  Follow up for thrombocytosis and iron deficiency.  HISTORY OF PRESENTING ILLNESS:  Nicole Sims is a  69 y.o.  female with PMH listed below who was referred to me for evaluation of thromcbocytosis  Reviewed patient's labs which were obtained by PCP.  09/12/2018 labs showed elevated platelet counts at 529,000, Wbc 9.6 and hemoglobin 13.3  Reviewed patient's previous labs. Thrombocytosis onset is chronic onset , duration since at least 2014. No aggravating or elevated factors. Associated symptoms or signs:  Denies weight loss, fever, chills, fatigue, night sweats.  Context:  Smoking history: Denies Family history of polycythemia.  Denies History of iron deficiency anemia denies History of DVT denies She feels nauseated.  Scheduled for endoscopy in March 2020. sleep apnea on CPAP  #Previous work-up showed  ak2 V617F mutation, CARL, MPL, E12-15 negative. Peripheral blood smear showed normal WBC morphology platelet appears increased.  Platelet varies in size with large body platelets noted. Iron panel showed TIBC 413, saturation 16, ferritin 30.  Received Venofer IV 200 mg x 2  # 01/22/2019 CT angios chest PE showed no evidence of PE. She was seen by pulmonology for evaluation and Dr.Gonzalez felt that her SOB is out of proportion to her lung disease.  Blood work showed new leukocytosis. Smear was done, showed predominatly lymphocytosis, no remarkable morphological changes, no blast.  Patient had NM perfusion study which showed low probability of pulmonary thrombosis.  Heart echo showed LVEF 60-65%,  grade 1 diastolic dysfunction-impaired relaxation.   INTERVAL HISTORY Nicole Sims is a 69 y.o. female who has above history reviewed by me today presents for follow up visit for thrombocytosis   Recent cataract surgery No new complaints.   Review of Systems  Constitutional: Negative for appetite change, chills, fatigue and fever.  HENT:   Negative for hearing loss and voice change.   Eyes: Negative for eye problems.  Respiratory: Negative for chest tightness, cough and shortness of breath.   Cardiovascular: Negative for chest pain.  Gastrointestinal: Negative for abdominal distention, abdominal pain and blood in stool.  Endocrine: Negative for hot flashes.  Genitourinary: Negative for difficulty urinating and frequency.   Musculoskeletal: Positive for arthralgias.  Skin: Negative for itching and rash.  Neurological: Negative for extremity weakness.  Hematological: Negative for adenopathy.  Psychiatric/Behavioral: Negative for confusion.    MEDICAL HISTORY:  Past Medical History:  Diagnosis Date   Anemia    Anxiety    Asthma    Cataract cortical, senile    CHF (congestive heart failure) (HCC)    Chronic headaches    Diastolic heart failure (HCC)    Diverticulosis    Environmental allergies    GERD (gastroesophageal reflux disease)    Heart murmur    Hyperglycemia    Hyperlipidemia    Hypertension    Leukocytosis    Obesity    Osteoarthritis    Palpitations    Restless leg syndrome    Sleep apnea    Thrombocytosis    Urinary incontinence    mixed   Venous insufficiency    Vitamin D deficiency     SURGICAL HISTORY: Past Surgical History:  Procedure Laterality Date   BLADDER SURGERY  x2   [facility]   BREAST CYST ASPIRATION Bilateral 2005   approximate year   CARDIAC CATHETERIZATION     [facility]   CATARACT EXTRACTION W/PHACO Right 04/24/2020   Procedure: CATARACT EXTRACTION PHACO AND INTRAOCULAR LENS PLACEMENT  ([place]) RIGHT;  Surgeon: Birder Robson, MD;  Location: [facility];  Service: Ophthalmology;  Laterality: Right;  6.75 0:37.3   CERVICAL CONE BIOPSY     CIS   CHOLECYSTECTOMY     COLONOSCOPY WITH PROPOFOL N/A 07/19/2016   Procedure: COLONOSCOPY WITH PROPOFOL;  Surgeon: Manya Silvas, MD;  Location: [facility] ENDOSCOPY;  Service: Endoscopy;  Laterality: N/A;   COLONOSCOPY WITH PROPOFOL N/A 10/20/2018   Procedure: COLONOSCOPY WITH PROPOFOL;  Surgeon: Lollie Sails, MD;  Location: [facility] ENDOSCOPY;  Service: Endoscopy;  Laterality: N/A;   ESOPHAGOGASTRODUODENOSCOPY (EGD) WITH PROPOFOL N/A 07/19/2016   Procedure: ESOPHAGOGASTRODUODENOSCOPY (EGD) WITH PROPOFOL;  Surgeon: Manya Silvas, MD;  Location: [facility] ENDOSCOPY;  Service: Endoscopy;  Laterality: N/A;   FLEXIBLE SIGMOIDOSCOPY     KNEE ARTHROSCOPY  08/13/08   knee replacement and revision     left   RECTOCELE REPAIR     RIGHT/LEFT HEART CATH AND CORONARY ANGIOGRAPHY Bilateral 04/10/2018   Procedure: RIGHT/LEFT HEART CATH AND CORONARY ANGIOGRAPHY;  Surgeon: Wellington Hampshire, MD;  Location: [facility];  Service: Cardiovascular;  Laterality: Bilateral;   ROTATOR CUFF REPAIR     bilateral   SHOULDER SURGERY  11/17/05   TONSILLECTOMY  1962   VAGINAL HYSTERECTOMY  1974   abnormal pap and carcinoma in situ    SOCIAL HISTORY: Social History   Socioeconomic History   Marital status: Married    Spouse name: Not on file   Number of children: Not on file   Years of education: Not on file   Highest education level: Not on file  Occupational History   Occupation: retired  Tobacco Use   Smoking status: Former Smoker    Packs/day: 1.00    Years: 15.00    Pack years: 15.00    Quit date: 08/17/1983    Years since quitting: 36.8   Smokeless tobacco: Never Used  Vaping Use   Vaping Use: Never used  Substance and Sexual Activity   Alcohol use: Yes    Alcohol/week: 0.0 standard drinks    Comment: rarely    Drug use: No   Sexual activity: Not on file  Other Topics Concern   Not on file  Social History Narrative   Lives at home with husband   Social Determinants of Health   Financial Resource Strain:    Difficulty of Paying Living Expenses: Not on file  Food Insecurity:    Worried About Charity fundraiser in the Last Year: Not on file   [employer] of Food in the Last Year: Not on file  Transportation Needs:    Lack of Transportation (Medical): Not on file   Lack of Transportation (Non-Medical): Not on file  Physical Activity:    Days of Exercise per Week: Not on file   Minutes of Exercise per Session: Not on file  Stress:    Feeling of Stress : Not on file  Social Connections:    Frequency of Communication with Friends and Family: Not on file   Frequency of Social Gatherings with Friends and Family: Not on file   Attends Religious Services: Not on file   Active Member of Clubs or Organizations: Not on file   Attends Archivist Meetings:  Not on file   Marital Status: Not on file  Intimate Partner Violence:    Fear of Current or Ex-Partner: Not on file   Emotionally Abused: Not on file   Physically Abused: Not on file   Sexually Abused: Not on file    FAMILY HISTORY: Family History  Problem Relation Age of Onset   Diabetes Mellitus II Father    Thyroid disease Father    Alzheimer's disease Father    Breast cancer Maternal Aunt    Hypertension Mother    Heart Problems Brother    Leukemia Maternal Aunt    Alzheimer's disease Paternal Aunt    Alzheimer's disease Paternal Uncle    Alzheimer's disease Paternal Uncle    Alzheimer's disease Paternal Uncle    Alzheimer's disease Paternal Aunt    Colon cancer Neg Hx    Kidney cancer Neg Hx    Bladder Cancer Neg Hx     ALLERGIES:  is allergic to augmentin [amoxicillin-pot clavulanate], bactrim [sulfamethoxazole-trimethoprim], doxycycline, hydrocodone-acetaminophen, levaquin  [levofloxacin], pseudoephedrine, and shellfish allergy.  MEDICATIONS:  Current Outpatient Medications  Medication Sig Dispense Refill   albuterol (VENTOLIN HFA) 108 (90 Base) MCG/ACT inhaler Inhale 2 puffs into the lungs every 6 (six) hours as needed for wheezing or shortness of breath. 18 g 1   Ascorbic Acid (VITAMIN C) 1000 MG tablet Take 1,000 mg by mouth daily.     aspirin EC 325 MG tablet Take 325 mg by mouth daily.      budesonide-formoterol (SYMBICORT) 80-4.5 MCG/ACT inhaler Inhale 2 puffs into the lungs 2 (two) times daily. 1 each 0   fluticasone (FLONASE) 50 MCG/ACT nasal spray Place 2 sprays into both nostrils daily.     furosemide (LASIX) 20 MG tablet Take 1 tablet (20 mg) by mouth once daily as needed for weight gain of 3 lbs or more overnight     gabapentin (NEURONTIN) 300 MG capsule Take 1 capsule (300 mg total) by mouth at bedtime as needed. (Patient taking differently: Take 600 mg by mouth at bedtime as needed. ) 30 capsule 1   metoprolol succinate (TOPROL-XL) 100 MG 24 hr tablet Take 100 mg by mouth daily.      montelukast (SINGULAIR) 10 MG tablet TAKE 1 TABLET BY MOUTH AT  BEDTIME (Patient taking differently: Take 10 mg by mouth at bedtime. ) 90 tablet 3   Multiple Vitamin (MULTIVITAMIN) tablet Take 1 tablet by mouth daily.     Multiple Vitamins-Minerals (VISION VITAMINS PO) Take 1 tablet by mouth.     ondansetron (ZOFRAN) 4 MG tablet Take 1 tablet (4 mg total) by mouth 2 (two) times daily as needed for nausea or vomiting. 15 tablet 0   potassium chloride (KLOR-CON) 10 MEQ tablet Take 1 tablet (10 meq) by mouth once daily as needed only when taking lasix (furosemide)     RIBOFLAVIN PO Take 200 mg by mouth in the morning and at bedtime.     rosuvastatin (CRESTOR) 10 MG tablet TAKE 1 TABLET BY MOUTH  DAILY 90 tablet 3   sucralfate (CARAFATE) 1 g tablet Take 1 g by mouth daily as needed (GI sympotms).      venlafaxine XR (EFFEXOR-XR) 150 MG 24 hr capsule TAKE 1  CAPSULE BY MOUTH  DAILY WITH BREAKFAST 90 capsule 3   vitamin B-12 (CYANOCOBALAMIN) 1000 MCG tablet Take 1,000 mcg by mouth daily.     Calcium Carb-Cholecalciferol (CALCIUM 500 +D) 500-400 MG-UNIT TABS Take 1 tablet by mouth daily. (Patient not taking: Reported on  06/05/2020)     Cholecalciferol (VITAMIN D-3) 1000 units CAPS Take 1,000 Units by mouth daily.  (Patient not taking: Reported on 06/05/2020)     magnesium oxide (MAG-OX) 400 MG tablet Take 400 mg daily by mouth. (Patient not taking: Reported on 06/05/2020)     nystatin (MYCOSTATIN) 100000 UNIT/ML suspension Take 5 mLs (500,000 Units total) by mouth 3 (three) times daily as needed. (Patient not taking: Reported on 06/18/2020) 60 mL 0   RABEprazole (ACIPHEX) 20 MG tablet Take 20 mg by mouth 2 (two) times daily. (Patient not taking: Reported on 06/05/2020)     No current facility-administered medications for this visit.     PHYSICAL EXAMINATION: ECOG PERFORMANCE STATUS: 1 - Symptomatic but completely ambulatory Vitals:   06/18/20 1005  BP: (!) 161/84  Pulse: 87  Resp: 18  Temp: (!) 97.4 F (36.3 C)   Filed Weights   06/18/20 1005  Weight: 240 lb 6.4 oz (109 kg)    Physical Exam Constitutional:      General: She is not in acute distress.    Appearance: She is obese.  HENT:     Head: Normocephalic and atraumatic.  Eyes:     General: No scleral icterus.    Pupils: Pupils are equal, round, and reactive to light.  Cardiovascular:     Rate and Rhythm: Normal rate and regular rhythm.     Heart sounds: Normal heart sounds.  Pulmonary:     Effort: Pulmonary effort is normal. No respiratory distress.     Breath sounds: No wheezing.  Abdominal:     General: Bowel sounds are normal. There is no distension.     Palpations: Abdomen is soft. There is no mass.     Tenderness: There is no abdominal tenderness.  Musculoskeletal:        General: No deformity. Normal range of motion.     Cervical back: Normal range of motion  and neck supple.  Skin:    General: Skin is warm and dry.     Findings: No erythema or rash.  Neurological:     Mental Status: She is alert and oriented to person, place, and time.     Cranial Nerves: No cranial nerve deficit.     Coordination: Coordination normal.  Psychiatric:        Behavior: Behavior normal.        Thought Content: Thought content normal.      LABORATORY DATA:  I have reviewed the data as listed Lab Results  Component Value Date   WBC 8.6 06/16/2020   HGB 12.2 06/16/2020   HCT 38.3 06/16/2020   MCV 89.1 06/16/2020   PLT 430 (H) 06/16/2020   Recent Labs    06/21/19 0908 06/21/19 0908 06/25/19 0947 06/25/19 0947 06/29/19 1100 10/24/19 0953 02/29/20 0808  NA 140   < > 140   < > 139 141 141  K 4.8   < > 3.7   < > 4.3 3.9 4.1  CL 104   < > 104   < > 102 102 107  CO2 28   < > 25   < > _0 GLUCOSE 102*   < > 114*   < > 116* 92 111*  BUN 13   < > 18   < > _1 CREATININE 0.90   < > 1.03*   < > 1.03 1.03* 1.01  CALCIUM 9.6   < > 9.3   < > 9.6 9.4 9.3  GFRNONAA  --   --  24*  --   --  55*  --   GFRAA  --   --  >60  --   --  >60  --   PROT 7.4   < > 8.5*  --   --  8.1 7.2  ALBUMIN 4.3   < > 4.3  --   --  4.0 4.0  AST 15   < > 22  --   --  11* 13  ALT 15   < > 19  --   --  16 12  ALKPHOS 93   < > 85  --   --  83 81  BILITOT 0.4   < > 0.8  --   --  0.7 0.4  BILIDIR 0.1  --   --   --   --   --  0.1   < > = values in this interval not displayed.   Iron/TIBC/Ferritin/ %Sat    Component Value Date/Time   IRON 78 06/16/2020 1245   TIBC 382 06/16/2020 1245   FERRITIN 59 06/16/2020 1245   IRONPCTSAT 20 06/16/2020 1245    RADIOGRAPHIC STUDIES: I have personally reviewed the radiological images as listed and agreed with the findings in the report. 01/22/2019 CT angiogram  chest PE protocol No evidence of pulmonary embolus.  No acute cardiopulmonary disease. No results found.  ASSESSMENT & PLAN:  1. Thrombocytosis    Labs reviewed and  discussed with patient. Thrombocytosis, chronic.  Patient has had extensive work-up in the past including bone marrow biopsy which did not any definitive pathological process.  Bone marrow biopsy showed polytypic plasmacytosis.  No significant dysplastic. Likely reactive due to chronic inflammation Patient has an elevated ESR, a positive ANA.  Chronic arthralgia.  Seen by rheumatology Dr. Meda Coffee and had a negative rheumatology work-up. I recommend observation.  I recommend patient to continue follow-up with primary care provider.  History of iron deficiency, iron panel showed adequate iron stores.  Leukocytosis has resolved.  Follow-up in 1 year.  Orders Placed This Encounter  Procedures   CBC with Differential/Platelet    Standing Status:   Future    Standing Expiration Date:   06/18/2021   Comprehensive metabolic panel    Standing Status:   Future    Standing Expiration Date:   06/18/2021   Ferritin    Standing Status:   Future    Standing Expiration Date:   06/18/2021   Iron and TIBC    Standing Status:   Future    Standing Expiration Date:   06/18/2021    All questions were answered. The patient knows to call the clinic with any problems questions or concerns.  Earlie Server, MD, PhD

## 2020-06-18 NOTE — Progress Notes (Signed)
Patient is followed up with neurology for history of strokes.

## 2020-06-25 ENCOUNTER — Encounter: Payer: Self-pay | Admitting: Internal Medicine

## 2020-06-27 ENCOUNTER — Encounter: Payer: Self-pay | Admitting: Internal Medicine

## 2020-06-27 ENCOUNTER — Other Ambulatory Visit: Payer: Self-pay

## 2020-06-27 ENCOUNTER — Ambulatory Visit (INDEPENDENT_AMBULATORY_CARE_PROVIDER_SITE_OTHER): Payer: Medicare Other | Admitting: Internal Medicine

## 2020-06-27 DIAGNOSIS — E7800 Pure hypercholesterolemia, unspecified: Secondary | ICD-10-CM

## 2020-06-27 DIAGNOSIS — L900 Lichen sclerosus et atrophicus: Secondary | ICD-10-CM

## 2020-06-27 DIAGNOSIS — D649 Anemia, unspecified: Secondary | ICD-10-CM

## 2020-06-27 DIAGNOSIS — G4733 Obstructive sleep apnea (adult) (pediatric): Secondary | ICD-10-CM

## 2020-06-27 DIAGNOSIS — I10 Essential (primary) hypertension: Secondary | ICD-10-CM

## 2020-06-27 DIAGNOSIS — R270 Ataxia, unspecified: Secondary | ICD-10-CM | POA: Diagnosis not present

## 2020-06-27 DIAGNOSIS — F439 Reaction to severe stress, unspecified: Secondary | ICD-10-CM

## 2020-06-27 DIAGNOSIS — E559 Vitamin D deficiency, unspecified: Secondary | ICD-10-CM

## 2020-06-27 DIAGNOSIS — K219 Gastro-esophageal reflux disease without esophagitis: Secondary | ICD-10-CM

## 2020-06-27 DIAGNOSIS — I5030 Unspecified diastolic (congestive) heart failure: Secondary | ICD-10-CM | POA: Diagnosis not present

## 2020-06-27 DIAGNOSIS — R739 Hyperglycemia, unspecified: Secondary | ICD-10-CM

## 2020-06-27 DIAGNOSIS — D75839 Thrombocytosis, unspecified: Secondary | ICD-10-CM

## 2020-06-27 MED ORDER — MUPIROCIN 2 % EX OINT: 1.0000 "application " | TOPICAL_OINTMENT | Freq: Two times a day (BID) | CUTANEOUS | 0 refills | Status: DC

## 2020-06-27 MED ORDER — TRIAMCINOLONE ACETONIDE 0.1 % EX CREA: 1.0000 "application " | TOPICAL_CREAM | Freq: Two times a day (BID) | CUTANEOUS | 0 refills | Status: DC

## 2020-06-27 NOTE — Progress Notes (Signed)
Patient ID: Nicole Sims, female   DOB: 03/05/1951, 69 y.o.   MRN: [id number]   Subjective:    Patient ID: Nicole Sims, female    DOB: 11-24-1950, 69 y.o.   MRN: [id number]  HPI This visit occurred during the SARS-CoV-2 public health emergency.  Safety protocols were in place, including screening questions prior to the visit, additional usage of staff PPE, and extensive cleaning of exam room while observing appropriate contact time as indicated for disinfecting solutions.  Patient here for a scheduled follow up. Just recently evaluated by Dr Tasia Catchings for f/u thrombocytosis.  S/p extensive w/up.  Felt likely due to chronic inflammation.  Previous negative rheumatology w/up.  Recommended to continue to monitor. Recommended f/u in one year.  Being followed by physiatry for chronic low back, neck and shoulder pain.  Recommended to continue exercises and tylenol.  If flare, can return for injection.  Also found to have neuropathy.  Reports some weakness in her legs and unsteadiness.  No recent falls.  Discussed PT.  She declines.  No chest pain or increased sob reported.  Has known diastolic dysfunction.  Monitoring her weight.  Takes lasix prn.  Also on toprol for paroxysmal SVT.  overall stable.  Blood pressure doing well.  No nausea or vomiting reported.  Bowels stable.  Does report bite - left forearm.  Some itching - left forearm.  No fever.    Past Medical History:  Diagnosis Date  . Anemia   . Anxiety   . Asthma   . Cataract cortical, senile   . CHF (congestive heart failure) ([place])   . Chronic headaches   . Diastolic heart failure ([place])   . Diverticulosis   . Environmental allergies   . GERD (gastroesophageal reflux disease)   . Heart murmur   . Hyperglycemia   . Hyperlipidemia   . Hypertension   . Leukocytosis   . Obesity   . Osteoarthritis   . Palpitations   . Restless leg syndrome   . Sleep apnea   . Thrombocytosis   . Urinary incontinence    mixed  . Venous insufficiency   .  Vitamin D deficiency    Past Surgical History:  Procedure Laterality Date  . BLADDER SURGERY     x2   [facility]  . BREAST CYST ASPIRATION Bilateral 2005   approximate year  . CARDIAC CATHETERIZATION     Nehemiah Massed  . CATARACT EXTRACTION W/PHACO Right 04/24/2020   Procedure: CATARACT EXTRACTION PHACO AND INTRAOCULAR LENS PLACEMENT ([place]) RIGHT;  Surgeon: Birder Robson, MD;  Location: [place];  Service: Ophthalmology;  Laterality: Right;  6.75 0:37.3  . CERVICAL CONE BIOPSY     CIS  . CHOLECYSTECTOMY    . COLONOSCOPY WITH PROPOFOL N/A 07/19/2016   Procedure: COLONOSCOPY WITH PROPOFOL;  Surgeon: Manya Silvas, MD;  Location: [facility] ENDOSCOPY;  Service: Endoscopy;  Laterality: N/A;  . COLONOSCOPY WITH PROPOFOL N/A 10/20/2018   Procedure: COLONOSCOPY WITH PROPOFOL;  Surgeon: Lollie Sails, MD;  Location: [facility] ENDOSCOPY;  Service: Endoscopy;  Laterality: N/A;  . ESOPHAGOGASTRODUODENOSCOPY (EGD) WITH PROPOFOL N/A 07/19/2016   Procedure: ESOPHAGOGASTRODUODENOSCOPY (EGD) WITH PROPOFOL;  Surgeon: Manya Silvas, MD;  Location: [facility] ENDOSCOPY;  Service: Endoscopy;  Laterality: N/A;  . FLEXIBLE SIGMOIDOSCOPY    . KNEE ARTHROSCOPY  08/13/08  . knee replacement and revision     left  . RECTOCELE REPAIR    . RIGHT/LEFT HEART CATH AND CORONARY ANGIOGRAPHY Bilateral 04/10/2018   Procedure: RIGHT/LEFT  HEART CATH AND CORONARY ANGIOGRAPHY;  Surgeon: Wellington Hampshire, MD;  Location: [facility] CV LAB;  Service: Cardiovascular;  Laterality: Bilateral;  . ROTATOR CUFF REPAIR     bilateral  . SHOULDER SURGERY  11/17/05  . TONSILLECTOMY  1962  . VAGINAL HYSTERECTOMY  1974   abnormal pap and carcinoma in situ   Family History  Problem Relation Age of Onset  . Diabetes Mellitus II Father   . Thyroid disease Father   . Alzheimer's disease Father   . Breast cancer Maternal Aunt   . Hypertension Mother   . Heart Problems Brother   . Leukemia Maternal Aunt   . Alzheimer's  disease Paternal Aunt   . Alzheimer's disease Paternal Uncle   . Alzheimer's disease Paternal Uncle   . Alzheimer's disease Paternal Uncle   . Alzheimer's disease Paternal Aunt   . Colon cancer Neg Hx   . Kidney cancer Neg Hx   . Bladder Cancer Neg Hx    Social History   Socioeconomic History  . Marital status: Married    Spouse name: Not on file  . Number of children: Not on file  . Years of education: Not on file  . Highest education level: Not on file  Occupational History  . Occupation: retired  Tobacco Use  . Smoking status: Former Smoker    Packs/day: 1.00    Years: 15.00    Pack years: 15.00    Quit date: 08/17/1983    Years since quitting: 36.8  . Smokeless tobacco: Never Used  Vaping Use  . Vaping Use: Never used  Substance and Sexual Activity  . Alcohol use: Yes    Alcohol/week: 0.0 standard drinks    Comment: rarely  . Drug use: No  . Sexual activity: Not on file  Other Topics Concern  . Not on file  Social History Narrative   Lives at home with husband   Social Determinants of Health   Financial Resource Strain:   . Difficulty of Paying Living Expenses: Not on file  Food Insecurity:   . Worried About Charity fundraiser in the Last Year: Not on file  . Ran Out of Food in the Last Year: Not on file  Transportation Needs:   . Lack of Transportation (Medical): Not on file  . Lack of Transportation (Non-Medical): Not on file  Physical Activity:   . Days of Exercise per Week: Not on file  . Minutes of Exercise per Session: Not on file  Stress:   . Feeling of Stress : Not on file  Social Connections:   . Frequency of Communication with Friends and Family: Not on file  . Frequency of Social Gatherings with Friends and Family: Not on file  . Attends Religious Services: Not on file  . Active Member of Clubs or Organizations: Not on file  . Attends Archivist Meetings: Not on file  . Marital Status: Not on file    Outpatient Encounter  Medications as of 06/27/2020  Medication Sig  . albuterol (VENTOLIN HFA) 108 (90 Base) MCG/ACT inhaler Inhale 2 puffs into the lungs every 6 (six) hours as needed for wheezing or shortness of breath.  Marland Kitchen aspirin EC 325 MG tablet Take 325 mg by mouth daily.   . budesonide-formoterol (SYMBICORT) 80-4.5 MCG/ACT inhaler Inhale 2 puffs into the lungs 2 (two) times daily.  . Cholecalciferol (VITAMIN D-3) 1000 units CAPS Take 1,000 Units by mouth daily.  (Patient not taking: Reported on 06/05/2020)  . fluticasone (  FLONASE) 50 MCG/ACT nasal spray Place 2 sprays into both nostrils daily.  . furosemide (LASIX) 20 MG tablet Take 1 tablet (20 mg) by mouth once daily as needed for weight gain of 3 lbs or more overnight  . gabapentin (NEURONTIN) 300 MG capsule Take 1 capsule (300 mg total) by mouth at bedtime as needed. (Patient taking differently: Take 600 mg by mouth at bedtime as needed. )  . metoprolol succinate (TOPROL-XL) 100 MG 24 hr tablet Take 100 mg by mouth daily.   . montelukast (SINGULAIR) 10 MG tablet TAKE 1 TABLET BY MOUTH AT  BEDTIME (Patient taking differently: Take 10 mg by mouth at bedtime. )  . Multiple Vitamin (MULTIVITAMIN) tablet Take 1 tablet by mouth daily.  . Multiple Vitamins-Minerals (VISION VITAMINS PO) Take 1 tablet by mouth.  . mupirocin ointment (BACTROBAN) 2 % Apply 1 application topically 2 (two) times daily. Apply to bite  . ondansetron (ZOFRAN) 4 MG tablet Take 1 tablet (4 mg total) by mouth 2 (two) times daily as needed for nausea or vomiting.  . potassium chloride (KLOR-CON) 10 MEQ tablet Take 1 tablet (10 meq) by mouth once daily as needed only when taking lasix (furosemide)  . RABEprazole (ACIPHEX) 20 MG tablet Take 20 mg by mouth 2 (two) times daily. (Patient not taking: Reported on 06/05/2020)  . RIBOFLAVIN PO Take 200 mg by mouth in the morning and at bedtime.  . rosuvastatin (CRESTOR) 10 MG tablet TAKE 1 TABLET BY MOUTH  DAILY  . sucralfate (CARAFATE) 1 g tablet Take 1  g by mouth daily as needed (GI sympotms).   . triamcinolone cream (KENALOG) 0.1 % Apply 1 application topically 2 (two) times daily.  Marland Kitchen venlafaxine XR (EFFEXOR-XR) 150 MG 24 hr capsule TAKE 1 CAPSULE BY MOUTH  DAILY WITH BREAKFAST  . vitamin B-12 (CYANOCOBALAMIN) 1000 MCG tablet Take 1,000 mcg by mouth daily.  . [DISCONTINUED] Ascorbic Acid (VITAMIN C) 1000 MG tablet Take 1,000 mg by mouth daily.  . [DISCONTINUED] Calcium Carb-Cholecalciferol (CALCIUM 500 +D) 500-400 MG-UNIT TABS Take 1 tablet by mouth daily. (Patient not taking: Reported on 06/05/2020)  . [DISCONTINUED] magnesium oxide (MAG-OX) 400 MG tablet Take 400 mg daily by mouth. (Patient not taking: Reported on 06/05/2020)  . [DISCONTINUED] nystatin (MYCOSTATIN) 100000 UNIT/ML suspension Take 5 mLs (500,000 Units total) by mouth 3 (three) times daily as needed. (Patient not taking: Reported on 06/18/2020)   No facility-administered encounter medications on file as of 06/27/2020.    Review of Systems  Constitutional: Negative for appetite change and unexpected weight change.  HENT: Negative for congestion and sinus pressure.   Respiratory: Negative for cough, chest tightness and shortness of breath.   Cardiovascular: Negative for chest pain, palpitations and leg swelling.       Toprol appears to be controlling increased heart rate - overall.    Gastrointestinal: Negative for abdominal pain, diarrhea, nausea and vomiting.  Genitourinary: Negative for difficulty urinating and dysuria.  Musculoskeletal:       Chronic back and neck pain.   Skin:       Faint erythematous rash - left forearm.  Raised lesion.  No surrounding erythema.    Neurological: Negative for dizziness, light-headedness and headaches.  Psychiatric/Behavioral: Negative for agitation and dysphoric mood.       Objective:    Physical Exam Vitals reviewed.  Constitutional:      General: She is not in acute distress.    Appearance: Normal appearance.  HENT:      Head:  Normocephalic and atraumatic.     Right Ear: External ear normal.     Left Ear: External ear normal.  Eyes:     General: No scleral icterus.       Right eye: No discharge.        Left eye: No discharge.     Conjunctiva/sclera: Conjunctivae normal.  Neck:     Thyroid: No thyromegaly.  Cardiovascular:     Rate and Rhythm: Normal rate and regular rhythm.  Pulmonary:     Effort: No respiratory distress.     Breath sounds: Normal breath sounds. No wheezing.  Abdominal:     General: Bowel sounds are normal.     Palpations: Abdomen is soft.     Tenderness: There is no abdominal tenderness.  Musculoskeletal:        General: No swelling or tenderness.     Cervical back: Neck supple. No tenderness.  Lymphadenopathy:     Cervical: No cervical adenopathy.  Skin:    Findings: Erythema and rash present.  Neurological:     Mental Status: She is alert.  Psychiatric:        Mood and Affect: Mood normal.        Behavior: Behavior normal.     BP 118/70   Pulse 67   Temp 98.1 F (36.7 C) (Oral)   Resp 16   Ht _0  (1.651 m)   Wt 237 lb (107.5 kg)   SpO2 99%   BMI 39.44 kg/m  Wt Readings from Last 3 Encounters:  06/27/20 237 lb (107.5 kg)  06/18/20 240 lb 6.4 oz (109 kg)  06/05/20 235 lb (106.6 kg)     Lab Results  Component Value Date   WBC 8.6 06/16/2020   HGB 12.2 06/16/2020   HCT 38.3 06/16/2020   PLT 430 (H) 06/16/2020   GLUCOSE 111 (H) 02/29/2020   CHOL 176 02/29/2020   TRIG 143.0 02/29/2020   HDL 55.30 02/29/2020   LDLDIRECT 172.8 09/17/2013   LDLCALC 92 02/29/2020   ALT 12 02/29/2020   AST 13 02/29/2020   NA 141 02/29/2020   K 4.1 02/29/2020   CL 107 02/29/2020   CREATININE 1.01 02/29/2020   BUN 13 02/29/2020   CO2 26 02/29/2020   TSH 2.008 10/24/2019   INR 1.0 06/11/2019   HGBA1C 5.9 02/29/2020   MICROALBUR 1.1 04/28/2015      Assessment & Plan:   Problem List Items Addressed This Visit    Vitamin D deficiency    Follow vitamin d level.         Relevant Orders   VITAMIN D 25 Hydroxy (Vit-D Deficiency, Fractures)   Thrombocytosis    Worked up by hematology.  Felt to be reactive.  Follow cbc.       Relevant Orders   CBC with Differential/Platelet   Stress    Overall appears to be handling things relatively well.  Follow.       OSA (obstructive sleep apnea)    Continue cpap.       Lichen sclerosus    Had pap 02/2020 - ok with negative HPV.  Recommended f/u with gyn.  Confirm f/u.       Hyperglycemia    Low carb diet and exercise.  Follow met b and a1c.       Relevant Orders   Hemoglobin A1c   Hypercholesteremia    On crestor.  Low cholesterol diet and exercise.  Follow lipid panel and liver function tests.  Relevant Orders   Hepatic function panel   Lipid panel   Gastroesophageal reflux disease    No upper symptoms reported.  On aciphex.       Essential hypertension    Blood pressure doing well.  Continue toprol.  Taking lasix prn.  Follow.       Relevant Orders   Basic metabolic panel   Diastolic heart failure ([place])    History of diastolic dysfunction.  EF 60-65%.  No evidence of volume overload.  Takes lasix prn.  Follow.       Ataxia    Leg weakness and unsteady gait - previously noted.  Still some weakness.  Evaluated by neurology.  Peripheral neuropathy.  Discussed physical therapy. She declines.       Anemia    Has been evaluated by hematology.  Stable.  Follow cbc.       Relevant Orders   IBC + Ferritin       Einar Pheasant, MD

## 2020-06-28 ENCOUNTER — Encounter: Payer: Self-pay | Admitting: Internal Medicine

## 2020-06-28 NOTE — Assessment & Plan Note (Signed)
Follow vitamin d level.   

## 2020-06-28 NOTE — Assessment & Plan Note (Signed)
Had pap 02/2020 - ok with negative HPV.  Recommended f/u with gyn.  Confirm f/u.

## 2020-06-28 NOTE — Assessment & Plan Note (Signed)
History of diastolic dysfunction.  EF 60-65%.  No evidence of volume overload.  Takes lasix prn.  Follow.

## 2020-06-28 NOTE — Assessment & Plan Note (Signed)
No upper symptoms reported.  On aciphex.   

## 2020-06-28 NOTE — Assessment & Plan Note (Signed)
Worked up by hematology.  Felt to be reactive.  Follow cbc.

## 2020-06-28 NOTE — Assessment & Plan Note (Signed)
Overall appears to be handling things relatively well.  Follow.   

## 2020-06-28 NOTE — Assessment & Plan Note (Signed)
Blood pressure doing well.  Continue toprol.  Taking lasix prn.  Follow.

## 2020-06-28 NOTE — Assessment & Plan Note (Signed)
Has been evaluated by hematology.  Stable.  Follow cbc.

## 2020-06-28 NOTE — Assessment & Plan Note (Signed)
On crestor.  Low cholesterol diet and exercise.  Follow lipid panel and liver function tests.   

## 2020-06-28 NOTE — Assessment & Plan Note (Signed)
Leg weakness and unsteady gait - previously noted.  Still some weakness.  Evaluated by neurology.  Peripheral neuropathy.  Discussed physical therapy. She declines.

## 2020-06-28 NOTE — Assessment & Plan Note (Signed)
Continue cpap.  

## 2020-06-28 NOTE — Assessment & Plan Note (Signed)
Low carb diet and exercise.  Follow met b and a1c.  

## 2020-07-01 ENCOUNTER — Other Ambulatory Visit: Payer: Medicare Other

## 2020-07-08 ENCOUNTER — Other Ambulatory Visit (INDEPENDENT_AMBULATORY_CARE_PROVIDER_SITE_OTHER): Payer: Medicare Other

## 2020-07-08 ENCOUNTER — Other Ambulatory Visit: Payer: Self-pay

## 2020-07-08 DIAGNOSIS — Z23 Encounter for immunization: Secondary | ICD-10-CM | POA: Diagnosis not present

## 2020-07-08 DIAGNOSIS — E7800 Pure hypercholesterolemia, unspecified: Secondary | ICD-10-CM | POA: Diagnosis not present

## 2020-07-08 DIAGNOSIS — E559 Vitamin D deficiency, unspecified: Secondary | ICD-10-CM

## 2020-07-08 DIAGNOSIS — R739 Hyperglycemia, unspecified: Secondary | ICD-10-CM | POA: Diagnosis not present

## 2020-07-08 DIAGNOSIS — I10 Essential (primary) hypertension: Secondary | ICD-10-CM

## 2020-07-08 LAB — HEPATIC FUNCTION PANEL
ALT: 11 U/L (ref 0–35)
AST: 14 U/L (ref 0–37)
Albumin: 4 g/dL (ref 3.5–5.2)
Alkaline Phosphatase: 80 U/L (ref 39–117)
Bilirubin, Direct: 0.1 mg/dL (ref 0.0–0.3)
Total Bilirubin: 0.5 mg/dL (ref 0.2–1.2)
Total Protein: 7 g/dL (ref 6.0–8.3)

## 2020-07-08 LAB — HEMOGLOBIN A1C: Hgb A1c MFr Bld: 5.9 % (ref 4.6–6.5)

## 2020-07-08 LAB — BASIC METABOLIC PANEL
BUN: 11 mg/dL (ref 6–23)
CO2: 28 mEq/L (ref 19–32)
Calcium: 9.3 mg/dL (ref 8.4–10.5)
Chloride: 105 mEq/L (ref 96–112)
Creatinine, Ser: 0.99 mg/dL (ref 0.40–1.20)
GFR: 57.89 mL/min — ABNORMAL LOW (ref >60.00)
Glucose, Bld: 103 mg/dL — ABNORMAL HIGH (ref 70–99)
Potassium: 4.7 mEq/L (ref 3.5–5.1)
Sodium: 140 mEq/L (ref 135–145)

## 2020-07-08 LAB — LIPID PANEL
Cholesterol: 148 mg/dL (ref 0–200)
HDL: 56 mg/dL (ref >39.00)
LDL Cholesterol: 71 mg/dL (ref 0–99)
NonHDL: 92.2
Total CHOL/HDL Ratio: 3
Triglycerides: 104 mg/dL (ref 0.0–149.0)
VLDL: 20.8 mg/dL (ref 0.0–40.0)

## 2020-07-08 LAB — VITAMIN D 25 HYDROXY (VIT D DEFICIENCY, FRACTURES): VITD: 36.57 ng/mL (ref 30.00–100.00)

## 2020-08-05 DIAGNOSIS — M5136 Other intervertebral disc degeneration, lumbar region: Secondary | ICD-10-CM | POA: Diagnosis not present

## 2020-08-05 DIAGNOSIS — M48061 Spinal stenosis, lumbar region without neurogenic claudication: Secondary | ICD-10-CM | POA: Diagnosis not present

## 2020-08-05 DIAGNOSIS — M5416 Radiculopathy, lumbar region: Secondary | ICD-10-CM | POA: Diagnosis not present

## 2020-09-02 DIAGNOSIS — M5416 Radiculopathy, lumbar region: Secondary | ICD-10-CM | POA: Diagnosis not present

## 2020-09-02 DIAGNOSIS — M48061 Spinal stenosis, lumbar region without neurogenic claudication: Secondary | ICD-10-CM | POA: Diagnosis not present

## 2020-09-02 DIAGNOSIS — M5136 Other intervertebral disc degeneration, lumbar region: Secondary | ICD-10-CM | POA: Diagnosis not present

## 2020-09-11 ENCOUNTER — Other Ambulatory Visit: Payer: Self-pay | Admitting: Physician Assistant

## 2020-09-11 DIAGNOSIS — E531 Pyridoxine deficiency: Secondary | ICD-10-CM | POA: Diagnosis not present

## 2020-09-11 DIAGNOSIS — G43019 Migraine without aura, intractable, without status migrainosus: Secondary | ICD-10-CM | POA: Diagnosis not present

## 2020-09-11 DIAGNOSIS — E538 Deficiency of other specified B group vitamins: Secondary | ICD-10-CM | POA: Diagnosis not present

## 2020-09-11 DIAGNOSIS — G4733 Obstructive sleep apnea (adult) (pediatric): Secondary | ICD-10-CM | POA: Diagnosis not present

## 2020-09-11 DIAGNOSIS — Z9989 Dependence on other enabling machines and devices: Secondary | ICD-10-CM | POA: Diagnosis not present

## 2020-09-11 DIAGNOSIS — I679 Cerebrovascular disease, unspecified: Secondary | ICD-10-CM | POA: Diagnosis not present

## 2020-09-11 DIAGNOSIS — R2689 Other abnormalities of gait and mobility: Secondary | ICD-10-CM | POA: Diagnosis not present

## 2020-09-12 NOTE — Telephone Encounter (Signed)
Please advise if ok to refill Historical provider medication.

## 2020-09-23 ENCOUNTER — Ambulatory Visit: Payer: Medicare Other | Admitting: Cardiovascular Disease

## 2020-09-23 ENCOUNTER — Encounter: Payer: Self-pay | Admitting: Cardiovascular Disease

## 2020-09-23 ENCOUNTER — Other Ambulatory Visit: Payer: Self-pay

## 2020-09-23 VITALS — BP 152/94 | HR 61 | Ht 65.0 in | Wt 238.0 lb

## 2020-09-23 DIAGNOSIS — R0600 Dyspnea, unspecified: Secondary | ICD-10-CM | POA: Diagnosis not present

## 2020-09-23 DIAGNOSIS — E785 Hyperlipidemia, unspecified: Secondary | ICD-10-CM

## 2020-09-23 DIAGNOSIS — I471 Supraventricular tachycardia: Secondary | ICD-10-CM

## 2020-09-23 DIAGNOSIS — I10 Essential (primary) hypertension: Secondary | ICD-10-CM | POA: Diagnosis not present

## 2020-09-23 DIAGNOSIS — R0609 Other forms of dyspnea: Secondary | ICD-10-CM

## 2020-09-23 NOTE — Progress Notes (Signed)
Cardiology Office Note   Date:  09/23/2020   ID:  Nicole Sims, DOB 12/14/1950, MRN [id number]  PCP:  Einar Pheasant, MD  Cardiologist:   Kathlyn Sacramento, MD   Chief Complaint  Patient presents with  . Follow-up    6 months  Pt states she has been having problems with fluid--having to take furosemide/potassium 7-8 times since she was last here. Still having palpitations. Still also having occasional CP and SOB.      History of Present Illness: Nicole Sims is a 70 y.o. female who is here today for follow-up visit regarding paroxysmal SVT and chronic diastolic heart failure.   She has multiple chronic medical conditions that include hyperlipidemia, sleep apnea on CPAP, essential hypertension and obesity.   She was evaluated for exertional dyspnea.  A right and left cardiac catheterization was done in August 2019 which showed normal coronary arteries and normal LV systolic function.  Right heart catheterization showed mildly elevated filling pressures with pulmonary capillary wedge pressure of 13 mmHg, no pulmonary hypertension and normal cardiac output. CTA of the chest showed no evidence of pulmonary embolism. She had a previously monitor for palpitations which showed normal sinus rhythm with a heart rate of 80 bpm.  She had short runs of SVT the longest lasted 7 beats.  The dose of Toprol was increased to 100 mg once daily with significant improvement in symptoms. She was evaluated by pulmonary as well for shortness of breath.  She has been doing reasonably well overall.  Palpitations seem to be controlled with Toprol although her heart rate does increase occasionally to close to 100 but does not sustained there.  She is still having problems with her CPAP machine.  No chest pain.  Shortness of breath is stable.  Past Medical History:  Diagnosis Date  . Anemia   . Anxiety   . Asthma   . Cataract cortical, senile   . CHF (congestive heart failure) ([place])   . Chronic headaches    . Diastolic heart failure ([place])   . Diverticulosis   . Environmental allergies   . GERD (gastroesophageal reflux disease)   . Heart murmur   . Hyperglycemia   . Hyperlipidemia   . Hypertension   . Leukocytosis   . Obesity   . Osteoarthritis   . Palpitations   . Restless leg syndrome   . Sleep apnea   . Thrombocytosis   . Urinary incontinence    mixed  . Venous insufficiency   . Vitamin D deficiency     Past Surgical History:  Procedure Laterality Date  . BLADDER SURGERY     x2   [facility]  . BREAST CYST ASPIRATION Bilateral 2005   approximate year  . CARDIAC CATHETERIZATION     Nehemiah Massed  . CATARACT EXTRACTION W/PHACO Right 04/24/2020   Procedure: CATARACT EXTRACTION PHACO AND INTRAOCULAR LENS PLACEMENT ([place]) RIGHT;  Surgeon: Birder Robson, MD;  Location: [place];  Service: Ophthalmology;  Laterality: Right;  6.75 0:37.3  . CERVICAL CONE BIOPSY     CIS  . CHOLECYSTECTOMY    . COLONOSCOPY WITH PROPOFOL N/A 07/19/2016   Procedure: COLONOSCOPY WITH PROPOFOL;  Surgeon: Manya Silvas, MD;  Location[facility] ENDOSCOPY;  Service: Endoscopy;  Laterality: N/A;  . COLONOSCOPY WITH PROPOFOL N/A 10/20/2018   Procedure: COLONOSCOPY WITH PROPOFOL;  Surgeon: Lollie Sails, MD;  Location: [facility] ENDOSCOPY;  Service: Endoscopy;  Laterality: N/A;  . ESOPHAGOGASTRODUODENOSCOPY (EGD) WITH PROPOFOL N/A 07/19/2016  Procedure: ESOPHAGOGASTRODUODENOSCOPY (EGD) WITH PROPOFOL;  Surgeon: Manya Silvas, MD;  Location: [facility] ENDOSCOPY;  Service: Endoscopy;  Laterality: N/A;  . FLEXIBLE SIGMOIDOSCOPY    . KNEE ARTHROSCOPY  08/13/08  . knee replacement and revision     left  . RECTOCELE REPAIR    . RIGHT/LEFT HEART CATH AND CORONARY ANGIOGRAPHY Bilateral 04/10/2018   Procedure: RIGHT/LEFT HEART CATH AND CORONARY ANGIOGRAPHY;  Surgeon: Wellington Hampshire, MD;  Location: [place] CV LAB;  Service: Cardiovascular;  Laterality: Bilateral;  . ROTATOR CUFF REPAIR      bilateral  . SHOULDER SURGERY  11/17/05  . TONSILLECTOMY  1962  . VAGINAL HYSTERECTOMY  1974   abnormal pap and carcinoma in situ     Current Outpatient Medications  Medication Sig Dispense Refill  . albuterol (VENTOLIN HFA) 108 (90 Base) MCG/ACT inhaler Inhale 2 puffs into the lungs every 6 (six) hours as needed for wheezing or shortness of breath. 18 g 1  . aspirin EC 325 MG tablet Take 325 mg by mouth daily.     Marland Kitchen azelastine (ASTELIN) 0.1 % nasal spray Place 1 spray into both nostrils 2 (two) times daily. Use in each nostril as directed    . budesonide-formoterol (SYMBICORT) 80-4.5 MCG/ACT inhaler Inhale 2 puffs into the lungs 2 (two) times daily. 1 each 0  . Cholecalciferol (VITAMIN D-3) 1000 units CAPS Take 1,000 Units by mouth daily.    . clobetasol cream (TEMOVATE) 8.11 % Apply 1 application topically 2 (two) times daily.    . furosemide (LASIX) 20 MG tablet Take 1 tablet (20 mg) by mouth once daily as needed for weight gain of 3 lbs or more overnight    . metoprolol succinate (TOPROL-XL) 100 MG 24 hr tablet TAKE 1 TABLET BY MOUTH  DAILY WITH OR IMMEDIATLEY  FOLLOWING A MEAL 90 tablet 0  . montelukast (SINGULAIR) 10 MG tablet TAKE 1 TABLET BY MOUTH AT  BEDTIME (Patient taking differently: Take 10 mg by mouth at bedtime.) 90 tablet 3  . Multiple Vitamin (MULTIVITAMIN) tablet Take 1 tablet by mouth daily.    . Multiple Vitamins-Minerals (VISION VITAMINS PO) Take 1 tablet by mouth.    . ondansetron (ZOFRAN) 4 MG tablet Take 1 tablet (4 mg total) by mouth 2 (two) times daily as needed for nausea or vomiting. 15 tablet 0  . potassium chloride (KLOR-CON) 10 MEQ tablet Take 1 tablet (10 meq) by mouth once daily as needed only when taking lasix (furosemide)    . pregabalin (LYRICA) 50 MG capsule Take by mouth. Take 1 capsule by mouth in the morning and 2 capsules at night.    . RABEprazole (ACIPHEX) 20 MG tablet Take 20 mg by mouth 2 (two) times daily.    Marland Kitchen RIBOFLAVIN PO Take 200 mg by mouth in  the morning and at bedtime.    . rosuvastatin (CRESTOR) 10 MG tablet TAKE 1 TABLET BY MOUTH  DAILY 90 tablet 3  . sucralfate (CARAFATE) 1 g tablet Take 1 g by mouth daily as needed (GI sympotms).     . triamcinolone cream (KENALOG) 0.1 % Apply 1 application topically 2 (two) times daily. 30 g 0  . venlafaxine XR (EFFEXOR-XR) 150 MG 24 hr capsule TAKE 1 CAPSULE BY MOUTH  DAILY WITH BREAKFAST 90 capsule 3  . vitamin B-12 (CYANOCOBALAMIN) 1000 MCG tablet Take 1,000 mcg by mouth daily.     No current facility-administered medications for this visit.    Allergies:   Augmentin [amoxicillin-pot clavulanate], Bactrim [  sulfamethoxazole-trimethoprim], Doxycycline, Hydrocodone-acetaminophen, Levaquin [levofloxacin], Pseudoephedrine, and Shellfish allergy    Social History:  The patient  reports that she quit smoking about 37 years ago. She has a 15.00 pack-year smoking history. She has never used smokeless tobacco. She reports current alcohol use. She reports that she does not use drugs.   Family History:  The patient's family history includes Alzheimer's disease in her father, paternal aunt, paternal aunt, paternal uncle, paternal uncle, and paternal uncle; Breast cancer in her maternal aunt; Diabetes Mellitus II in her father; Heart Problems in her brother; Hypertension in her mother; Leukemia in her maternal aunt; Thyroid disease in her father.    ROS:  Please see the history of present illness.   Otherwise, review of systems are positive for none.   All other systems are reviewed and negative.    PHYSICAL EXAM: VS:  BP (!) 152/94   Pulse 61   Ht 5\' 5"  (1.651 m)   Wt 238 lb (108 kg)   BMI 39.61 kg/m  , BMI Body mass index is 39.61 kg/m. GEN: Well nourished, well developed, in no acute distress  HEENT: normal  Neck: no JVD, carotid bruits, or masses Cardiac: RRR; no murmurs, rubs, or gallops,no edema  Respiratory:  clear to auscultation bilaterally, normal work of breathing GI: soft,  nontender, nondistended, + BS MS: no deformity or atrophy  Skin: warm and dry, no rash Neuro:  Strength and sensation are intact Psych: euthymic mood, full affect   EKG:  EKG is ordered today. The ekg ordered today demonstrates normal sinus rhythm with no significant ST or T wave changes.   Recent Labs: 10/24/2019: TSH 2.008 06/16/2020: Hemoglobin 12.2; Platelets 430 07/08/2020: ALT 11; BUN 11; Creatinine, Ser 0.99; Potassium 4.7; Sodium 140    Lipid Panel    Component Value Date/Time   CHOL 148 07/08/2020 0919   TRIG 104.0 07/08/2020 0919   HDL 56.00 07/08/2020 0919   CHOLHDL 3 07/08/2020 0919   VLDL 20.8 07/08/2020 0919   LDLCALC 71 07/08/2020 0919   LDLDIRECT 172.8 09/17/2013 0950      Wt Readings from Last 3 Encounters:  09/23/20 238 lb (108 kg)  06/27/20 237 lb (107.5 kg)  06/18/20 240 lb 6.4 oz (109 kg)       PAD Screen 04/04/2018  Previous PAD dx? No  Previous surgical procedure? No  Pain with walking? Yes  Subsides with rest? Yes  Feet/toe relief with dangling? No  Painful, non-healing ulcers? No  Extremities discolored? No      ASSESSMENT AND PLAN:  1.  Paroxysmal SVT: Symptoms are reasonably controlled with current dose of Toprol 100 mg once daily.  I do not recommend increasing the dose any further given relatively low heart rate.  2.  Chronic diastolic heart failure: He uses furosemide as needed very infrequently.  She appears to be euvolemic by exam.  3.  Hyperlipidemia: Currently on rosuvastatin.  Most recent lipid profile showed an LDL of 71.  4.  Essential hypertension: Blood pressure is elevated today but it tends to fluctuate.  Most recent blood pressure November was 118/70.  I elected not to add any medications for that reason.  If blood pressure trends up, an ARB or amlodipine can be considered.   Disposition:   FU  in 1 year.  Signed,  Kathlyn Sacramento, MD  09/23/2020 3:33 PM    [place]

## 2020-09-23 NOTE — Patient Instructions (Signed)

## 2020-10-09 DIAGNOSIS — K581 Irritable bowel syndrome with constipation: Secondary | ICD-10-CM | POA: Diagnosis not present

## 2020-10-09 DIAGNOSIS — K2100 Gastro-esophageal reflux disease with esophagitis, without bleeding: Secondary | ICD-10-CM | POA: Diagnosis not present

## 2020-10-09 DIAGNOSIS — R110 Nausea: Secondary | ICD-10-CM | POA: Diagnosis not present

## 2020-10-28 ENCOUNTER — Other Ambulatory Visit: Payer: Self-pay

## 2020-10-28 ENCOUNTER — Ambulatory Visit (INDEPENDENT_AMBULATORY_CARE_PROVIDER_SITE_OTHER): Payer: Medicare Other | Admitting: Internal Medicine

## 2020-10-28 DIAGNOSIS — D473 Essential (hemorrhagic) thrombocythemia: Secondary | ICD-10-CM | POA: Diagnosis not present

## 2020-10-28 DIAGNOSIS — G8929 Other chronic pain: Secondary | ICD-10-CM

## 2020-10-28 DIAGNOSIS — D75839 Thrombocytosis, unspecified: Secondary | ICD-10-CM | POA: Diagnosis not present

## 2020-10-28 DIAGNOSIS — J029 Acute pharyngitis, unspecified: Secondary | ICD-10-CM | POA: Diagnosis not present

## 2020-10-28 DIAGNOSIS — D649 Anemia, unspecified: Secondary | ICD-10-CM

## 2020-10-28 DIAGNOSIS — F439 Reaction to severe stress, unspecified: Secondary | ICD-10-CM

## 2020-10-28 DIAGNOSIS — I5030 Unspecified diastolic (congestive) heart failure: Secondary | ICD-10-CM | POA: Diagnosis not present

## 2020-10-28 DIAGNOSIS — R739 Hyperglycemia, unspecified: Secondary | ICD-10-CM

## 2020-10-28 DIAGNOSIS — Z1231 Encounter for screening mammogram for malignant neoplasm of breast: Secondary | ICD-10-CM

## 2020-10-28 DIAGNOSIS — G4733 Obstructive sleep apnea (adult) (pediatric): Secondary | ICD-10-CM | POA: Diagnosis not present

## 2020-10-28 DIAGNOSIS — R002 Palpitations: Secondary | ICD-10-CM

## 2020-10-28 DIAGNOSIS — E7800 Pure hypercholesterolemia, unspecified: Secondary | ICD-10-CM

## 2020-10-28 DIAGNOSIS — K219 Gastro-esophageal reflux disease without esophagitis: Secondary | ICD-10-CM | POA: Diagnosis not present

## 2020-10-28 DIAGNOSIS — R1310 Dysphagia, unspecified: Secondary | ICD-10-CM

## 2020-10-28 DIAGNOSIS — I10 Essential (primary) hypertension: Secondary | ICD-10-CM | POA: Diagnosis not present

## 2020-10-28 DIAGNOSIS — R519 Headache, unspecified: Secondary | ICD-10-CM

## 2020-10-28 NOTE — Progress Notes (Signed)
Patient ID: Nicole Sims, female   DOB: [birth year], 70 y.o.   MRN: [id number]   Subjective:    Patient ID: Nicole Sims, female    DOB: 1950-12-05, 70 y.o.   MRN: [id number]  HPI This visit occurred during the SARS-CoV-2 public health emergency.  Safety protocols were in place, including screening questions prior to the visit, additional usage of staff PPE, and extensive cleaning of exam room while observing appropriate contact time as indicated for disinfecting solutions.  Patient here for a scheduled follow up.  Here to follow up regarding her blood pressure, blood sugar and cholesterol.  She reports that she does not feel as clear thinking today. feels "spacy".   This occurs intermittently.  Previous headache.  No significant headache now.  No significant sinus pressure.  Does report throat is scratchy.  She feels this is related to drainage.  Some fatigue.  No chest pain reported.  Feels breathing is stable.  Not using cpap.  No change in appetite.  Is eating mostly one meal per day.  No vomiting.  Saw Dr Fletcher Anon for f/u 09/23/20.  Has history of short runs of SVT - longes 7 beats.  Toprol was increased to 139m q day previously.  Appears to be doing relatively well on this dose.  Takes lasix prn.  Saw GI f/u 09/2020.  IB gard appears to have helped lower abdominal pain.  Taking aciphex and prn carafate - helped aciphex.  Some dysphagia.  Discussed f/u with GI.    Past Medical History:  Diagnosis Date  . Anemia   . Anxiety   . Asthma   . Cataract cortical, senile   . CHF (congestive heart failure) ([facility]   . Chronic headaches   . Diastolic heart failure ([facility]   . Diverticulosis   . Environmental allergies   . GERD (gastroesophageal reflux disease)   . Heart murmur   . Hyperglycemia   . Hyperlipidemia   . Hypertension   . Leukocytosis   . Obesity   . Osteoarthritis   . Palpitations   . Restless leg syndrome   . Sleep apnea   . Thrombocytosis   . Urinary incontinence    mixed  . Venous  insufficiency   . Vitamin D deficiency    Past Surgical History:  Procedure Laterality Date  . BLADDER SURGERY     x2   [facility]  . BREAST CYST ASPIRATION Bilateral 2005   approximate year  . CARDIAC CATHETERIZATION     K[facility]hem[facility] Massed . CATARACT EXTRACTION W/PHACO Right 04/24/2020   Procedure: CATARACT EXTRACTION PHACO AND INTRAOCULAR LENS PLACEMENT ([place] RIGHT;  Surgeon: PBirder Robson MD;  [facility]  Service: Ophthalmology;  Laterality: Right;  6.75 0:37.3  . CERVICAL CONE BIOPSY     CIS  . CHOLECYSTECTOMY    . COLONOSCOPY WITH PROPOFOL N/A 07/19/2016   Procedure: COLONOSCOPY WITH PROPOFOL;  Surgeon: RManya Silvas MD;  [facility]  Service: Endoscopy;  Laterality: N/A;  . COLONOSCOPY WITH PROPOFOL N/A 10/20/2018   Procedure: COLONOSCOPY WITH PROPOFOL;  Surgeon: SLollie Sails MD;  [facility];  Service: Endoscopy;  Laterality: N/A;  . ESOPHAGOGASTRODUODENOSCOPY (EGD) WITH PROPOFOL N/A 07/19/2016   Procedure: ESOPHAGOGASTRODUODENOSCOPY (EGD) WITH PROPOFOL;  Surgeon: RManya Silvas MD;  [facility];  Service: Endoscopy;  Laterality: N/A;  . FLEXIBLE SIGMOIDOSCOPY    . KNEE ARTHROSCOPY  08/13/08  . knee replacement and revision     left  . RECTOCELE  REPAIR    . RIGHT/LEFT HEART CATH AND CORONARY ANGIOGRAPHY Bilateral 04/10/2018   Procedure: RIGHT/LEFT HEART CATH AND CORONARY ANGIOGRAPHY;  Surgeon: Wellington Hampshire, MD;  Location: [facility];  Service: Cardiovascular;  Laterality: Bilateral;  . ROTATOR CUFF REPAIR     bilateral  . SHOULDER SURGERY  11/17/05  . TONSILLECTOMY  1962  . VAGINAL HYSTERECTOMY  1974   abnormal pap and carcinoma in situ   Family History  Problem Relation Age of Onset  . Diabetes Mellitus II Father   . Thyroid disease Father   . Alzheimer's disease Father   . Breast cancer Maternal Aunt   . Hypertension Mother   . Heart Problems Brother   . Leukemia Maternal Aunt    . Alzheimer's disease Paternal Aunt   . Alzheimer's disease Paternal Uncle   . Alzheimer's disease Paternal Uncle   . Alzheimer's disease Paternal Uncle   . Alzheimer's disease Paternal Aunt   . Colon cancer Neg Hx   . Kidney cancer Neg Hx   . Bladder Cancer Neg Hx    Social History   Socioeconomic History  . Marital status: Married    Spouse name: Not on file  . Number of children: Not on file  . Years of education: Not on file  . Highest education level: Not on file  Occupational History  . Occupation: retired  Tobacco Use  . Smoking status: Former Smoker    Packs/day: 1.00    Years: 15.00    Pack years: 15.00    Quit date: 08/17/1983    Years since quitting: 37.2  . Smokeless tobacco: Never Used  Vaping Use  . Vaping Use: Never used  Substance and Sexual Activity  . Alcohol use: Yes    Alcohol/week: 0.0 standard drinks    Comment: rarely  . Drug use: No  . Sexual activity: Not on file  Other Topics Concern  . Not on file  Social History Narrative   Lives at home with husband   Social Determinants of Health   Financial Resource Strain: Not on file  Food Insecurity: Not on file  Transportation Needs: Not on file  Physical Activity: Not on file  Stress: Not on file  Social Connections: Not on file    Outpatient Encounter Medications as of 10/28/2020  Medication Sig  . albuterol (VENTOLIN HFA) 108 (90 Base) MCG/ACT inhaler Inhale 2 puffs into the lungs every 6 (six) hours as needed for wheezing or shortness of breath.  Marland Kitchen aspirin EC 325 MG tablet Take 325 mg by mouth daily.   Marland Kitchen azelastine (ASTELIN) 0.1 % nasal spray Place 1 spray into both nostrils 2 (two) times daily. Use in each nostril as directed  . budesonide-formoterol (SYMBICORT) 80-4.5 MCG/ACT inhaler Inhale 2 puffs into the lungs 2 (two) times daily.  . Cholecalciferol (VITAMIN D-3) 1000 units CAPS Take 1,000 Units by mouth daily.  . clobetasol cream (TEMOVATE) 1.09 % Apply 1 application topically 2  (two) times daily.  . furosemide (LASIX) 20 MG tablet Take 1 tablet (20 mg) by mouth once daily as needed for weight gain of 3 lbs or more overnight  . metoprolol succinate (TOPROL-XL) 100 MG 24 hr tablet TAKE 1 TABLET BY MOUTH  DAILY WITH OR IMMEDIATLEY  FOLLOWING A MEAL  . montelukast (SINGULAIR) 10 MG tablet TAKE 1 TABLET BY MOUTH AT  BEDTIME (Patient taking differently: Take 10 mg by mouth at bedtime.)  . Multiple Vitamins-Minerals (VISION VITAMINS PO) Take 1 tablet by mouth.  Marland Kitchen  ondansetron (ZOFRAN) 4 MG tablet Take 1 tablet (4 mg total) by mouth 2 (two) times daily as needed for nausea or vomiting.  . potassium chloride (KLOR-CON) 10 MEQ tablet Take 1 tablet (10 meq) by mouth once daily as needed only when taking lasix (furosemide)  . pregabalin (LYRICA) 50 MG capsule Take by mouth. Take 1 capsule by mouth in the morning and 2 capsules at night.  . RABEprazole (ACIPHEX) 20 MG tablet Take 20 mg by mouth 2 (two) times daily.  . rosuvastatin (CRESTOR) 10 MG tablet TAKE 1 TABLET BY MOUTH  DAILY  . sucralfate (CARAFATE) 1 g tablet Take 1 g by mouth daily as needed (GI sympotms).   . triamcinolone cream (KENALOG) 0.1 % Apply 1 application topically 2 (two) times daily.  Marland Kitchen venlafaxine XR (EFFEXOR-XR) 150 MG 24 hr capsule TAKE 1 CAPSULE BY MOUTH  DAILY WITH BREAKFAST  . vitamin B-12 (CYANOCOBALAMIN) 1000 MCG tablet Take 1,000 mcg by mouth daily.  . [DISCONTINUED] Multiple Vitamin (MULTIVITAMIN) tablet Take 1 tablet by mouth daily.  . [DISCONTINUED] RIBOFLAVIN PO Take 200 mg by mouth in the morning and at bedtime.   No facility-administered encounter medications on file as of 10/28/2020.    Review of Systems  Constitutional: Negative for appetite change and unexpected weight change.  HENT: Positive for congestion.        Scratchy throat.  Increased drainage.    Respiratory: Negative for cough and chest tightness.        Breathing stable.   Cardiovascular: Negative for chest pain and leg  swelling.       Palpitations appear to be doing better on toprol 11m q day.   Gastrointestinal: Negative for abdominal pain, nausea and vomiting.       Dysphagia.   Genitourinary: Negative for difficulty urinating and dysuria.  Musculoskeletal: Negative for joint swelling and myalgias.  Skin: Negative for color change and rash.  Neurological:       History of migraine headaches. spacy sensation.  Some congestion as outlined.  No significant dizziness reported.   Psychiatric/Behavioral: Negative for agitation and dysphoric mood.       Objective:    Physical Exam Vitals reviewed.  Constitutional:      General: She is not in acute distress.    Appearance: Normal appearance.  HENT:     Head: Normocephalic and atraumatic.     Right Ear: External ear normal.     Left Ear: External ear normal.  Eyes:     General: No scleral icterus.       Right eye: No discharge.        Left eye: No discharge.  Neck:     Thyroid: No thyromegaly.  Cardiovascular:     Rate and Rhythm: Normal rate and regular rhythm.  Pulmonary:     Effort: No respiratory distress.     Breath sounds: Normal breath sounds. No wheezing.  Abdominal:     General: Bowel sounds are normal.     Palpations: Abdomen is soft.     Tenderness: There is no abdominal tenderness.  Musculoskeletal:        General: No swelling or tenderness.     Cervical back: Neck supple. No tenderness.  Lymphadenopathy:     Cervical: No cervical adenopathy.  Skin:    Findings: No erythema or rash.  Neurological:     Mental Status: She is alert.  Psychiatric:        Mood and Affect: Mood normal.  Behavior: Behavior normal.     BP 118/70   Pulse 68   Temp 97.6 F (36.4 C) (Oral)   Resp 16   Ht _0  (1.651 m)   Wt 238 lb (108 kg)   SpO2 99%   BMI 39.61 kg/m  Wt Readings from Last 3 Encounters:  10/28/20 238 lb (108 kg)  09/23/20 238 lb (108 kg)  06/27/20 237 lb (107.5 kg)     Lab Results  Component Value Date    WBC 8.6 06/16/2020   HGB 12.2 06/16/2020   HCT 38.3 06/16/2020   PLT 430 (H) 06/16/2020   GLUCOSE 103 (H) 07/08/2020   CHOL 148 07/08/2020   TRIG 104.0 07/08/2020   HDL 56.00 07/08/2020   LDLDIRECT 172.8 09/17/2013   LDLCALC 71 07/08/2020   ALT 11 07/08/2020   AST 14 07/08/2020   NA 140 07/08/2020   K 4.7 07/08/2020   CL 105 07/08/2020   CREATININE 0.99 07/08/2020   BUN 11 07/08/2020   CO2 28 07/08/2020   TSH 2.008 10/24/2019   INR 1.0 06/11/2019   HGBA1C 5.9 07/08/2020   MICROALBUR 1.1 04/28/2015       Assessment & Plan:   Problem List Items Addressed This Visit    Anemia    Has been evaluated by hematology.  Follow cbc.       Breast cancer screening    Discussed.  She will schedule mammogram. Order for mammogram in system.       Chronic headaches    Saw neurology.  Discussed trial melatonin and vitamin B2.  spacy sensation today.  Not suing cpap.  Needs to use.  See if can get her sleeping well.  No focal deficits noted on exam.  Discussed eating regular meals.  Stay hydrated.  Blood pressure doing well.  No increased heart rate.  Appears to be doing well on toprol.  Treat allergies.  Follow.  Keep f/u with neurology.        Diastolic heart failure (H[facility])    History of diastolic dysfunction.  EF 60-65%.  No evidence of volume overload.  Taking lasix prn.  Follow.       Dysphagia    Small bites.  Chew food well.  Acid reflux appears to be controlled.  Continue aciphex.  Per GI, carafate prn.  F/u with GI if dysphagia.        Essential (hemorrhagic) thrombocythemia ([place])    Worked up previously by hematology.  Felt to be reactive.  Follow cbc.       Essential hypertension    Continue toprol.  Blood pressure as outlined.  Follow pressrues.  Follow metabolic panel. No medication changes today.       Relevant Orders   Basic metabolic panel   Gastroesophageal reflux disease    On aciphex.  Acid reflux appears to be controlled.       Hypercholesteremia     Continue crestor.  Low cholesterol diet and exercise.  Follow lipid panel and liver function tests.       Relevant Orders   Lipid panel   Hepatic function panel   TSH   Hyperglycemia    Low carb diet and exercise.  Follow met b and a1c.       Relevant Orders   Hemoglobin A1c   OSA (obstructive sleep apnea)    Not using cpap.  Needs to use.  Follow.       Palpitations    Appears to be doing well on  15m toprol.  Follow.       Sore throat    Describes scratchy throat, congestion/drainage.  May be - allergies.  Discussed possible covid.  Will swab to r/u covid.  Quarantine.  Steroid nasal spray/robitussin.  Follow.  Breathing stable.       Relevant Orders   Novel Coronavirus, NAA ([facility]) (Completed)   Stress    Continue effexor.  Follow.            CEinar Pheasant MD

## 2020-10-30 LAB — SARS-COV-2, NAA 2 DAY TAT

## 2020-10-30 LAB — NOVEL CORONAVIRUS, NAA: SARS-CoV-2, NAA: NOT DETECTED

## 2020-11-08 ENCOUNTER — Encounter: Payer: Self-pay | Admitting: Internal Medicine

## 2020-11-08 ENCOUNTER — Telehealth: Payer: Self-pay | Admitting: Internal Medicine

## 2020-11-08 DIAGNOSIS — Z1239 Encounter for other screening for malignant neoplasm of breast: Secondary | ICD-10-CM | POA: Insufficient documentation

## 2020-11-08 DIAGNOSIS — R1310 Dysphagia, unspecified: Secondary | ICD-10-CM | POA: Insufficient documentation

## 2020-11-08 NOTE — Assessment & Plan Note (Signed)
Continue crestor.  Low cholesterol diet and exercise. Follow lipid panel and liver function tests.   

## 2020-11-08 NOTE — Assessment & Plan Note (Signed)
Small bites.  Chew food well.  Acid reflux appears to be controlled.  Continue aciphex.  Per GI, carafate prn.  F/u with GI if dysphagia.

## 2020-11-08 NOTE — Assessment & Plan Note (Signed)
Low carb diet and exercise.  Follow met b and a1c.  

## 2020-11-08 NOTE — Assessment & Plan Note (Signed)
Worked up previously by hematology.  Felt to be reactive.  Follow cbc.

## 2020-11-08 NOTE — Assessment & Plan Note (Signed)
Continue effexor.  Follow.

## 2020-11-08 NOTE — Assessment & Plan Note (Signed)
History of diastolic dysfunction.  EF 60-65%.  No evidence of volume overload.  Taking lasix prn.  Follow.

## 2020-11-08 NOTE — Assessment & Plan Note (Signed)
Describes scratchy throat, congestion/drainage.  May be - allergies.  Discussed possible covid.  Will swab to r/u covid.  Quarantine.  Steroid nasal spray/robitussin.  Follow.  Breathing stable.

## 2020-11-08 NOTE — Assessment & Plan Note (Signed)
Continue toprol.  Blood pressure as outlined.  Follow pressrues.  Follow metabolic panel. No medication changes today.

## 2020-11-08 NOTE — Assessment & Plan Note (Signed)
Discussed.  She will schedule mammogram. Order for mammogram in system.

## 2020-11-08 NOTE — Assessment & Plan Note (Signed)
Saw neurology.  Discussed trial melatonin and vitamin B2.  spacy sensation today.  Not suing cpap.  Needs to use.  See if can get her sleeping well.  No focal deficits noted on exam.  Discussed eating regular meals.  Stay hydrated.  Blood pressure doing well.  No increased heart rate.  Appears to be doing well on toprol.  Treat allergies.  Follow.  Keep f/u with neurology.

## 2020-11-08 NOTE — Assessment & Plan Note (Signed)
Has been evaluated by hematology.  Follow cbc.   

## 2020-11-08 NOTE — Assessment & Plan Note (Signed)
On aciphex.  Acid reflux appears to be controlled.

## 2020-11-08 NOTE — Assessment & Plan Note (Signed)
Appears to be doing well on 100mg  toprol.  Follow.

## 2020-11-08 NOTE — Assessment & Plan Note (Signed)
Not using cpap.  Needs to use.  Follow.

## 2020-11-08 NOTE — Telephone Encounter (Signed)
Needs physical in 4 months.  Also need fasting labs in 3-4 weeks.  Thanks.    Dr Nicki Reaper

## 2020-11-11 NOTE — Telephone Encounter (Signed)
Scheduled

## 2020-12-04 DIAGNOSIS — E538 Deficiency of other specified B group vitamins: Secondary | ICD-10-CM | POA: Diagnosis not present

## 2020-12-04 DIAGNOSIS — M5136 Other intervertebral disc degeneration, lumbar region: Secondary | ICD-10-CM | POA: Diagnosis not present

## 2020-12-04 DIAGNOSIS — G43019 Migraine without aura, intractable, without status migrainosus: Secondary | ICD-10-CM | POA: Diagnosis not present

## 2020-12-05 ENCOUNTER — Other Ambulatory Visit: Payer: Self-pay | Admitting: Cardiovascular Disease

## 2020-12-10 ENCOUNTER — Other Ambulatory Visit: Payer: Self-pay

## 2020-12-10 ENCOUNTER — Other Ambulatory Visit (INDEPENDENT_AMBULATORY_CARE_PROVIDER_SITE_OTHER): Payer: Medicare Other

## 2020-12-10 DIAGNOSIS — I10 Essential (primary) hypertension: Secondary | ICD-10-CM | POA: Diagnosis not present

## 2020-12-10 DIAGNOSIS — R739 Hyperglycemia, unspecified: Secondary | ICD-10-CM | POA: Diagnosis not present

## 2020-12-10 DIAGNOSIS — E7800 Pure hypercholesterolemia, unspecified: Secondary | ICD-10-CM | POA: Diagnosis not present

## 2020-12-10 LAB — TSH: TSH: 2.06 u[IU]/mL (ref 0.35–4.50)

## 2020-12-10 LAB — LIPID PANEL
Cholesterol: 166 mg/dL (ref 0–200)
HDL: 50.5 mg/dL (ref >39.00)
LDL Cholesterol: 91 mg/dL (ref 0–99)
NonHDL: 115.28
Total CHOL/HDL Ratio: 3
Triglycerides: 121 mg/dL (ref 0.0–149.0)
VLDL: 24.2 mg/dL (ref 0.0–40.0)

## 2020-12-10 LAB — HEMOGLOBIN A1C: Hgb A1c MFr Bld: 5.9 % (ref 4.6–6.5)

## 2020-12-10 LAB — BASIC METABOLIC PANEL
BUN: 12 mg/dL (ref 6–23)
CO2: 26 mEq/L (ref 19–32)
Calcium: 9 mg/dL (ref 8.4–10.5)
Chloride: 105 mEq/L (ref 96–112)
Creatinine, Ser: 0.98 mg/dL (ref 0.40–1.20)
GFR: 58.42 mL/min — ABNORMAL LOW (ref >60.00)
Glucose, Bld: 95 mg/dL (ref 70–99)
Potassium: 4.1 mEq/L (ref 3.5–5.1)
Sodium: 140 mEq/L (ref 135–145)

## 2020-12-10 LAB — HEPATIC FUNCTION PANEL
ALT: 9 U/L (ref 0–35)
AST: 11 U/L (ref 0–37)
Albumin: 3.9 g/dL (ref 3.5–5.2)
Alkaline Phosphatase: 83 U/L (ref 39–117)
Bilirubin, Direct: 0.1 mg/dL (ref 0.0–0.3)
Total Bilirubin: 0.5 mg/dL (ref 0.2–1.2)
Total Protein: 6.8 g/dL (ref 6.0–8.3)

## 2020-12-11 ENCOUNTER — Encounter: Payer: Self-pay | Admitting: Internal Medicine

## 2020-12-11 NOTE — Telephone Encounter (Signed)
We have been following her kidney function and this last check improved.  I was planning on discussing this with her more.  Please schedule an appt to discuss.

## 2020-12-18 DIAGNOSIS — H2512 Age-related nuclear cataract, left eye: Secondary | ICD-10-CM | POA: Diagnosis not present

## 2021-01-02 ENCOUNTER — Other Ambulatory Visit: Payer: Self-pay

## 2021-01-02 ENCOUNTER — Ambulatory Visit (INDEPENDENT_AMBULATORY_CARE_PROVIDER_SITE_OTHER): Payer: Medicare Other | Admitting: Internal Medicine

## 2021-01-02 VITALS — BP 126/70 | HR 76 | Temp 98.1°F | Resp 16 | Ht 65.0 in | Wt 240.2 lb

## 2021-01-02 DIAGNOSIS — K219 Gastro-esophageal reflux disease without esophagitis: Secondary | ICD-10-CM | POA: Diagnosis not present

## 2021-01-02 DIAGNOSIS — M5030 Other cervical disc degeneration, unspecified cervical region: Secondary | ICD-10-CM | POA: Diagnosis not present

## 2021-01-02 DIAGNOSIS — M79602 Pain in left arm: Secondary | ICD-10-CM

## 2021-01-02 DIAGNOSIS — M5412 Radiculopathy, cervical region: Secondary | ICD-10-CM | POA: Diagnosis not present

## 2021-01-02 DIAGNOSIS — R739 Hyperglycemia, unspecified: Secondary | ICD-10-CM

## 2021-01-02 DIAGNOSIS — M542 Cervicalgia: Secondary | ICD-10-CM | POA: Diagnosis not present

## 2021-01-02 DIAGNOSIS — F439 Reaction to severe stress, unspecified: Secondary | ICD-10-CM

## 2021-01-02 DIAGNOSIS — E7800 Pure hypercholesterolemia, unspecified: Secondary | ICD-10-CM

## 2021-01-02 DIAGNOSIS — N1831 Chronic kidney disease, stage 3a: Secondary | ICD-10-CM

## 2021-01-02 DIAGNOSIS — R270 Ataxia, unspecified: Secondary | ICD-10-CM

## 2021-01-02 DIAGNOSIS — I10 Essential (primary) hypertension: Secondary | ICD-10-CM | POA: Diagnosis not present

## 2021-01-02 DIAGNOSIS — M48061 Spinal stenosis, lumbar region without neurogenic claudication: Secondary | ICD-10-CM | POA: Diagnosis not present

## 2021-01-02 DIAGNOSIS — M5416 Radiculopathy, lumbar region: Secondary | ICD-10-CM | POA: Diagnosis not present

## 2021-01-02 DIAGNOSIS — M5136 Other intervertebral disc degeneration, lumbar region: Secondary | ICD-10-CM | POA: Diagnosis not present

## 2021-01-02 DIAGNOSIS — I700 Atherosclerosis of aorta: Secondary | ICD-10-CM | POA: Diagnosis not present

## 2021-01-02 DIAGNOSIS — R944 Abnormal results of kidney function studies: Secondary | ICD-10-CM | POA: Diagnosis not present

## 2021-01-02 DIAGNOSIS — M47816 Spondylosis without myelopathy or radiculopathy, lumbar region: Secondary | ICD-10-CM | POA: Diagnosis not present

## 2021-01-02 DIAGNOSIS — M47812 Spondylosis without myelopathy or radiculopathy, cervical region: Secondary | ICD-10-CM | POA: Diagnosis not present

## 2021-01-02 NOTE — Progress Notes (Signed)
Patient ID: Nicole Sims, female   DOB: 1950-11-06, 70 y.o.   MRN: [id number]   Subjective:    Patient ID: Nicole Sims, female    DOB: June 25, 1951, 70 y.o.   MRN: [id number]  HPI This visit occurred during the SARS-CoV-2 public health emergency.  Safety protocols were in place, including screening questions prior to the visit, additional usage of staff PPE, and extensive cleaning of exam room while observing appropriate contact time as indicated for disinfecting solutions.  Patient here for work in appt to discuss her kidney function.  Discussed decreased GFR - recent checks - 54 (02/2020), 57 (06/2020) and 58 (11/2020).  Discussed CKD.  Discussed importance of control of blood pressure and sugars.  Discussed staying hydrated and avoiding antiinflammatories.  Unable to add ace inhibitor - due to blood pressure.  (mostly averages 120s).  Did report previous episode of dizziness and blood pressure 90s.  No problems since.  Again discussed staying hydrated.  Eating.  Does report nausea.  No vomiting.  No abdominal pain.  No diarrhea.  No chest pain or sob reported.  Saw rheumatology - pain left shoulder/arm.  Prescribed prednisone.  Seeing neurology for peripheral neuropathy and balance issues.  Continues on lyrica and effexor.    Past Medical History:  Diagnosis Date  . Anemia   . Anxiety   . Asthma   . Cataract cortical, senile   . CHF (congestive heart failure) ([place])   . Chronic headaches   . Diastolic heart failure ([place])   . Diverticulosis   . Environmental allergies   . GERD (gastroesophageal reflux disease)   . Heart murmur   . Hyperglycemia   . Hyperlipidemia   . Hypertension   . Leukocytosis   . Obesity   . Osteoarthritis   . Palpitations   . Restless leg syndrome   . Sleep apnea   . Thrombocytosis   . Urinary incontinence    mixed  . Venous insufficiency   . Vitamin D deficiency    Past Surgical History:  Procedure Laterality Date  . BLADDER SURGERY     x2   [place]  [facility] [place]  . BREAST CYST ASPIRATION Bilateral 2005   approximate year  . CARDIAC CATHETERIZATION     Nehemiah Massed  . CATARACT EXTRACTION W/PHACO Right 04/24/2020   Procedure: CATARACT EXTRACTION PHACO AND INTRAOCULAR LENS PLACEMENT ([place]) RIGHT;  Surgeon: Birder Robson, MD;  Location: [place];  Service: Ophthalmology;  Laterality: Right;  6.75 0:37.3  . CERVICAL CONE BIOPSY     CIS  . CHOLECYSTECTOMY    . COLONOSCOPY WITH PROPOFOL N/A 07/19/2016   Procedure: COLONOSCOPY WITH PROPOFOL;  Surgeon: Manya Silvas, MD;  Location: [facility] ENDOSCOPY;  Service: Endoscopy;  Laterality: N/A;  . COLONOSCOPY WITH PROPOFOL N/A 10/20/2018   Procedure: COLONOSCOPY WITH PROPOFOL;  Surgeon: Lollie Sails, MD;  Location: [facility] ENDOSCOPY;  Service: Endoscopy;  Laterality: N/A;  . ESOPHAGOGASTRODUODENOSCOPY (EGD) WITH PROPOFOL N/A 07/19/2016   Procedure: ESOPHAGOGASTRODUODENOSCOPY (EGD) WITH PROPOFOL;  Surgeon: Manya Silvas, MD;  Location: [facility] ENDOSCOPY;  Service: Endoscopy;  Laterality: N/A;  . FLEXIBLE SIGMOIDOSCOPY    . KNEE ARTHROSCOPY  08/13/08  . knee replacement and revision     left  . RECTOCELE REPAIR    . RIGHT/LEFT HEART CATH AND CORONARY ANGIOGRAPHY Bilateral 04/10/2018   Procedure: RIGHT/LEFT HEART CATH AND CORONARY ANGIOGRAPHY;  Surgeon: Wellington Hampshire, MD;  Location: [place] CV LAB;  Service: Cardiovascular;  Laterality: Bilateral;  . ROTATOR CUFF  REPAIR     bilateral  . SHOULDER SURGERY  11/17/05  . TONSILLECTOMY  1962  . VAGINAL HYSTERECTOMY  1974   abnormal pap and carcinoma in situ   Family History  Problem Relation Age of Onset  . Diabetes Mellitus II Father   . Thyroid disease Father   . Alzheimer's disease Father   . Breast cancer Maternal Aunt   . Hypertension Mother   . Heart Problems Brother   . Leukemia Maternal Aunt   . Alzheimer's disease Paternal Aunt   . Alzheimer's disease Paternal Uncle   . Alzheimer's disease Paternal Uncle   . Alzheimer's  disease Paternal Uncle   . Alzheimer's disease Paternal Aunt   . Colon cancer Neg Hx   . Kidney cancer Neg Hx   . Bladder Cancer Neg Hx    Social History   Socioeconomic History  . Marital status: Married    Spouse name: Not on file  . Number of children: Not on file  . Years of education: Not on file  . Highest education level: Not on file  Occupational History  . Occupation: retired  Tobacco Use  . Smoking status: Former Smoker    Packs/day: 1.00    Years: 15.00    Pack years: 15.00    Quit date: 08/17/1983    Years since quitting: 37.4  . Smokeless tobacco: Never Used  Vaping Use  . Vaping Use: Never used  Substance and Sexual Activity  . Alcohol use: Yes    Alcohol/week: 0.0 standard drinks    Comment: rarely  . Drug use: No  . Sexual activity: Not on file  Other Topics Concern  . Not on file  Social History Narrative   Lives at home with husband   Social Determinants of Health   Financial Resource Strain: Not on file  Food Insecurity: Not on file  Transportation Needs: Not on file  Physical Activity: Not on file  Stress: Not on file  Social Connections: Not on file    Outpatient Encounter Medications as of 01/02/2021  Medication Sig  . albuterol (VENTOLIN HFA) 108 (90 Base) MCG/ACT inhaler Inhale 2 puffs into the lungs every 6 (six) hours as needed for wheezing or shortness of breath.  Marland Kitchen aspirin EC 325 MG tablet Take 325 mg by mouth daily.   Marland Kitchen azelastine (ASTELIN) 0.1 % nasal spray Place 1 spray into both nostrils 2 (two) times daily. Use in each nostril as directed  . budesonide-formoterol (SYMBICORT) 80-4.5 MCG/ACT inhaler Inhale 2 puffs into the lungs 2 (two) times daily.  . Cholecalciferol (VITAMIN D-3) 1000 units CAPS Take 1,000 Units by mouth daily.  . clobetasol cream (TEMOVATE) 0.86 % Apply 1 application topically 2 (two) times daily.  . furosemide (LASIX) 20 MG tablet Take 1 tablet (20 mg) by mouth once daily as needed for weight gain of 3 lbs or more  overnight  . metoprolol succinate (TOPROL-XL) 100 MG 24 hr tablet TAKE 1 TABLET BY MOUTH  DAILY WITH OR IMMEDIATLEY  FOLLOWING A MEAL  . montelukast (SINGULAIR) 10 MG tablet TAKE 1 TABLET BY MOUTH AT  BEDTIME (Patient taking differently: Take 10 mg by mouth at bedtime.)  . ondansetron (ZOFRAN) 4 MG tablet Take 1 tablet (4 mg total) by mouth 2 (two) times daily as needed for nausea or vomiting.  . potassium chloride (KLOR-CON) 10 MEQ tablet Take 1 tablet (10 meq) by mouth once daily as needed only when taking lasix (furosemide)  . pregabalin (LYRICA) 50 MG capsule  Take by mouth. Take 1 capsule by mouth in the morning and 2 capsules at night.  . RABEprazole (ACIPHEX) 20 MG tablet Take 20 mg by mouth 2 (two) times daily.  . rosuvastatin (CRESTOR) 10 MG tablet TAKE 1 TABLET BY MOUTH  DAILY  . sucralfate (CARAFATE) 1 g tablet Take 1 g by mouth daily as needed (GI sympotms).   . triamcinolone cream (KENALOG) 0.1 % Apply 1 application topically 2 (two) times daily.  Marland Kitchen venlafaxine XR (EFFEXOR-XR) 150 MG 24 hr capsule TAKE 1 CAPSULE BY MOUTH  DAILY WITH BREAKFAST  . vitamin B-12 (CYANOCOBALAMIN) 1000 MCG tablet Take 1,000 mcg by mouth daily.  . [DISCONTINUED] Multiple Vitamins-Minerals (VISION VITAMINS PO) Take 1 tablet by mouth.   No facility-administered encounter medications on file as of 01/02/2021.    Review of Systems  Constitutional: Negative for appetite change and unexpected weight change.  HENT: Negative for congestion and sinus pressure.   Respiratory: Negative for cough and chest tightness.        Breathing stable.   Cardiovascular: Negative for chest pain, palpitations and leg swelling.  Gastrointestinal: Positive for nausea. Negative for abdominal pain, diarrhea and vomiting.  Genitourinary: Negative for difficulty urinating and dysuria.  Musculoskeletal: Negative for joint swelling and myalgias.  Skin: Negative for color change and rash.  Neurological:       No current dizziness now.   No increased headache.   Psychiatric/Behavioral: Negative for agitation and dysphoric mood.       Objective:    Physical Exam Vitals reviewed.  Constitutional:      General: She is not in acute distress.    Appearance: Normal appearance.  HENT:     Head: Normocephalic and atraumatic.     Right Ear: External ear normal.     Left Ear: External ear normal.  Eyes:     General: No scleral icterus.       Right eye: No discharge.        Left eye: No discharge.     Conjunctiva/sclera: Conjunctivae normal.  Neck:     Thyroid: No thyromegaly.  Cardiovascular:     Rate and Rhythm: Normal rate and regular rhythm.  Pulmonary:     Effort: No respiratory distress.     Breath sounds: Normal breath sounds. No wheezing.  Abdominal:     General: Bowel sounds are normal.     Palpations: Abdomen is soft.     Tenderness: There is no abdominal tenderness.  Musculoskeletal:        General: No swelling or tenderness.     Cervical back: Neck supple. No tenderness.  Lymphadenopathy:     Cervical: No cervical adenopathy.  Skin:    Findings: No erythema or rash.  Neurological:     Mental Status: She is alert.  Psychiatric:        Mood and Affect: Mood normal.        Behavior: Behavior normal.     BP 126/70   Pulse 76   Temp 98.1 F (36.7 C) (Oral)   Resp 16   Ht $R'5\' 5"'OA$  (1.651 m)   Wt 240 lb 3.2 oz (109 kg)   SpO2 98%   BMI 39.97 kg/m  Wt Readings from Last 3 Encounters:  01/02/21 240 lb 3.2 oz (109 kg)  10/28/20 238 lb (108 kg)  09/23/20 238 lb (108 kg)     Lab Results  Component Value Date   WBC 8.6 06/16/2020   HGB 12.2 06/16/2020   HCT  38.3 06/16/2020   PLT 430 (H) 06/16/2020   GLUCOSE 95 12/10/2020   CHOL 166 12/10/2020   TRIG 121.0 12/10/2020   HDL 50.50 12/10/2020   LDLDIRECT 172.8 09/17/2013   LDLCALC 91 12/10/2020   ALT 9 12/10/2020   AST 11 12/10/2020   NA 140 12/10/2020   K 4.1 12/10/2020   CL 105 12/10/2020   CREATININE 0.98 12/10/2020   BUN 12  12/10/2020   CO2 26 12/10/2020   TSH 2.06 12/10/2020   INR 1.0 06/11/2019   HGBA1C 5.9 12/10/2020   MICROALBUR 1.1 04/28/2015       Assessment & Plan:   Problem List Items Addressed This Visit    Aortic atherosclerosis ([place])    Continue crestor.       Ataxia    Seeing neurology.  Peripheral neuropathy.  Continues on lyrica.  Follow.        CKD (chronic kidney disease) stage 3, GFR 30-59 ml/min (HCC)    Discussed CKD with her today.  Discussed importance of control of blood pressure and sugars.  Discussed staying hydrated and avoiding antiinflammatories.  Unable to add ace inhibitor - due to blood pressure.  (mostly averages 120s).  Did report previous episode of dizziness and blood pressure 90s.  No problems since.  Again discussed staying hydrated. Follow metabolic panel.       Essential hypertension    Continues on toprol.  Blood pressure as outlined.  Unable to add ace inhibitor or ARB as outlined.  Follow pressures.  Follow metabolic panel.       Gastroesophageal reflux disease    Continue aciphex.  Some nausea.  Monitor for possible triggers.        Hypercholesteremia    Continue crestor.  Low cholesterol diet and exercise.  Follow lipid panel and liver function tests.        Hyperglycemia    Low carb diet and exercise.  Follow met b and a1c.       Left arm pain    Reproducible with certain movements.  Evaluated by rheumatology.  Prescribed prednisone.  Follow.       Stress    Continue effexor.  Overall appears to be stable.        Other Visit Diagnoses    Decreased GFR    -  Primary   Relevant Orders   Urinalysis, Routine w reflex microscopic       Einar Pheasant, MD

## 2021-01-03 ENCOUNTER — Encounter: Payer: Self-pay | Admitting: Internal Medicine

## 2021-01-03 DIAGNOSIS — N183 Chronic kidney disease, stage 3 unspecified: Secondary | ICD-10-CM | POA: Insufficient documentation

## 2021-01-03 DIAGNOSIS — M79602 Pain in left arm: Secondary | ICD-10-CM | POA: Insufficient documentation

## 2021-01-03 DIAGNOSIS — I700 Atherosclerosis of aorta: Secondary | ICD-10-CM | POA: Insufficient documentation

## 2021-01-03 NOTE — Assessment & Plan Note (Signed)
Reproducible with certain movements.  Evaluated by rheumatology.  Prescribed prednisone.  Follow.

## 2021-01-03 NOTE — Assessment & Plan Note (Signed)
Continue crestor 

## 2021-01-03 NOTE — Assessment & Plan Note (Signed)
Continue aciphex.  Some nausea.  Monitor for possible triggers.

## 2021-01-03 NOTE — Assessment & Plan Note (Signed)
Continues on toprol.  Blood pressure as outlined.  Unable to add ace inhibitor or ARB as outlined.  Follow pressures.  Follow metabolic panel.

## 2021-01-03 NOTE — Assessment & Plan Note (Signed)
Seeing neurology.  Peripheral neuropathy.  Continues on lyrica.  Follow.

## 2021-01-03 NOTE — Assessment & Plan Note (Signed)
Continue crestor.  Low cholesterol diet and exercise. Follow lipid panel and liver function tests.   

## 2021-01-03 NOTE — Assessment & Plan Note (Signed)
Continue effexor.  Overall appears to be stable.

## 2021-01-03 NOTE — Assessment & Plan Note (Signed)
Low carb diet and exercise.  Follow met b and a1c.  

## 2021-01-03 NOTE — Assessment & Plan Note (Signed)
Discussed CKD with her today.  Discussed importance of control of blood pressure and sugars.  Discussed staying hydrated and avoiding antiinflammatories.  Unable to add ace inhibitor - due to blood pressure.  (mostly averages 120s).  Did report previous episode of dizziness and blood pressure 90s.  No problems since.  Again discussed staying hydrated. Follow metabolic panel.

## 2021-01-06 ENCOUNTER — Other Ambulatory Visit: Payer: Self-pay | Admitting: Gastroenterology

## 2021-01-06 DIAGNOSIS — K219 Gastro-esophageal reflux disease without esophagitis: Secondary | ICD-10-CM | POA: Diagnosis not present

## 2021-01-06 DIAGNOSIS — R1314 Dysphagia, pharyngoesophageal phase: Secondary | ICD-10-CM | POA: Diagnosis not present

## 2021-01-06 DIAGNOSIS — K581 Irritable bowel syndrome with constipation: Secondary | ICD-10-CM | POA: Diagnosis not present

## 2021-01-07 ENCOUNTER — Other Ambulatory Visit: Payer: Self-pay | Admitting: Gastroenterology

## 2021-01-07 DIAGNOSIS — R1314 Dysphagia, pharyngoesophageal phase: Secondary | ICD-10-CM

## 2021-01-08 DIAGNOSIS — M47816 Spondylosis without myelopathy or radiculopathy, lumbar region: Secondary | ICD-10-CM | POA: Diagnosis not present

## 2021-01-12 ENCOUNTER — Encounter: Payer: Self-pay | Admitting: Internal Medicine

## 2021-01-13 ENCOUNTER — Ambulatory Visit
Admission: RE | Admit: 2021-01-13 | Discharge: 2021-01-13 | Disposition: A | Discharge status: Home / Self Care | Payer: Medicare Other | Source: Ambulatory Visit | Attending: Gastroenterology | Admitting: Gastroenterology

## 2021-01-13 ENCOUNTER — Other Ambulatory Visit: Payer: Self-pay | Admitting: Gastroenterology

## 2021-01-13 ENCOUNTER — Other Ambulatory Visit: Payer: Self-pay

## 2021-01-13 DIAGNOSIS — R1314 Dysphagia, pharyngoesophageal phase: Secondary | ICD-10-CM

## 2021-01-13 DIAGNOSIS — K219 Gastro-esophageal reflux disease without esophagitis: Secondary | ICD-10-CM | POA: Diagnosis not present

## 2021-01-14 DIAGNOSIS — H2512 Age-related nuclear cataract, left eye: Secondary | ICD-10-CM | POA: Diagnosis not present

## 2021-01-15 ENCOUNTER — Encounter: Payer: Self-pay | Admitting: Ophthalmology

## 2021-01-16 ENCOUNTER — Other Ambulatory Visit: Payer: Self-pay | Admitting: Internal Medicine

## 2021-01-21 ENCOUNTER — Encounter: Payer: Self-pay | Admitting: Internal Medicine

## 2021-01-22 ENCOUNTER — Other Ambulatory Visit: Payer: Self-pay

## 2021-01-22 DIAGNOSIS — R944 Abnormal results of kidney function studies: Secondary | ICD-10-CM

## 2021-01-22 NOTE — Telephone Encounter (Signed)
LM to schedule lab appt for urine. Orders in

## 2021-01-23 NOTE — Telephone Encounter (Signed)
FYI for doc of day  Patient did not want to come in for urine today. She has been having some diarrhea for the last couple of days. Patient denies any other symptoms. Advised that If symptoms are persistent she needs to go to urgent care. Confirmed she is eating and drinking. She agreed to go to urgent care this PM or tomorrow if her symptoms do not resolve.

## 2021-01-23 NOTE — Telephone Encounter (Signed)
Please confirm doing ok and can reschedule urine test.

## 2021-01-27 ENCOUNTER — Encounter
Admission: RE | Disposition: A | Discharge status: Home / Self Care | Payer: Self-pay | Source: Home / Self Care | Attending: Ophthalmology

## 2021-01-27 ENCOUNTER — Ambulatory Visit: Payer: Medicare Other | Admitting: Anesthesiology

## 2021-01-27 ENCOUNTER — Other Ambulatory Visit: Payer: Self-pay

## 2021-01-27 ENCOUNTER — Ambulatory Visit
Admission: RE | Admit: 2021-01-27 | Discharge: 2021-01-27 | Disposition: A | Discharge status: Home / Self Care | Payer: Medicare Other | Attending: Ophthalmology | Admitting: Ophthalmology

## 2021-01-27 ENCOUNTER — Encounter: Payer: Self-pay | Admitting: Ophthalmology

## 2021-01-27 DIAGNOSIS — H2512 Age-related nuclear cataract, left eye: Secondary | ICD-10-CM | POA: Diagnosis not present

## 2021-01-27 DIAGNOSIS — Z7982 Long term (current) use of aspirin: Secondary | ICD-10-CM | POA: Insufficient documentation

## 2021-01-27 DIAGNOSIS — Z91013 Allergy to seafood: Secondary | ICD-10-CM | POA: Diagnosis not present

## 2021-01-27 DIAGNOSIS — Z881 Allergy status to other antibiotic agents status: Secondary | ICD-10-CM | POA: Diagnosis not present

## 2021-01-27 DIAGNOSIS — Z885 Allergy status to narcotic agent status: Secondary | ICD-10-CM | POA: Insufficient documentation

## 2021-01-27 DIAGNOSIS — Z87891 Personal history of nicotine dependence: Secondary | ICD-10-CM | POA: Insufficient documentation

## 2021-01-27 DIAGNOSIS — Z888 Allergy status to other drugs, medicaments and biological substances status: Secondary | ICD-10-CM | POA: Insufficient documentation

## 2021-01-27 DIAGNOSIS — Z7951 Long term (current) use of inhaled steroids: Secondary | ICD-10-CM | POA: Diagnosis not present

## 2021-01-27 DIAGNOSIS — Z880 Allergy status to penicillin: Secondary | ICD-10-CM | POA: Insufficient documentation

## 2021-01-27 DIAGNOSIS — Z79899 Other long term (current) drug therapy: Secondary | ICD-10-CM | POA: Insufficient documentation

## 2021-01-27 DIAGNOSIS — Z9841 Cataract extraction status, right eye: Secondary | ICD-10-CM | POA: Insufficient documentation

## 2021-01-27 DIAGNOSIS — H25812 Combined forms of age-related cataract, left eye: Secondary | ICD-10-CM | POA: Diagnosis not present

## 2021-01-27 DIAGNOSIS — Z961 Presence of intraocular lens: Secondary | ICD-10-CM | POA: Diagnosis not present

## 2021-01-27 HISTORY — PX: CATARACT EXTRACTION W/PHACO: SHX586

## 2021-01-27 HISTORY — DX: Presence of dental prosthetic device (complete) (partial): Z97.2

## 2021-01-27 SURGERY — PHACOEMULSIFICATION, CATARACT, WITH IOL INSERTION
Anesthesia: Monitor Anesthesia Care | Laterality: Left

## 2021-01-27 MED ORDER — ARMC OPHTHALMIC DILATING DROPS
1.0000 "application " | OPHTHALMIC | Status: DC | PRN
  Administered 2021-01-27 (×3): 1 via OPHTHALMIC

## 2021-01-27 MED ORDER — BRIMONIDINE TARTRATE-TIMOLOL 0.2-0.5 % OP SOLN
OPHTHALMIC | Status: DC | PRN
  Administered 2021-01-27: 1 [drp] via OPHTHALMIC

## 2021-01-27 MED ORDER — MIDAZOLAM HCL 2 MG/2ML IJ SOLN
INTRAMUSCULAR | Status: DC | PRN
  Administered 2021-01-27: 1.5 mg via INTRAVENOUS

## 2021-01-27 MED ORDER — LIDOCAINE HCL (PF) 2 % IJ SOLN
INTRAOCULAR | Status: DC | PRN
  Administered 2021-01-27: 1 mL via INTRAMUSCULAR

## 2021-01-27 MED ORDER — NA CHONDROIT SULF-NA HYALURON 40-17 MG/ML IO SOLN
INTRAOCULAR | Status: DC | PRN
  Administered 2021-01-27: 1 mL via INTRAOCULAR

## 2021-01-27 MED ORDER — FENTANYL CITRATE (PF) 100 MCG/2ML IJ SOLN
INTRAMUSCULAR | Status: DC | PRN
  Administered 2021-01-27: 50 ug via INTRAVENOUS

## 2021-01-27 MED ORDER — LACTATED RINGERS IV SOLN: INTRAVENOUS | Status: DC

## 2021-01-27 MED ORDER — POLYMYXIN B-TRIMETHOPRIM 10000-0.1 UNIT/ML-% OP SOLN
OPHTHALMIC | Status: DC | PRN
  Administered 2021-01-27: 1 [drp]

## 2021-01-27 MED ORDER — TETRACAINE HCL 0.5 % OP SOLN
1.0000 [drp] | OPHTHALMIC | Status: DC | PRN
  Administered 2021-01-27 (×3): 1 [drp] via OPHTHALMIC

## 2021-01-27 MED ORDER — EPINEPHRINE PF 1 MG/ML IJ SOLN
INTRAOCULAR | Status: DC | PRN
  Administered 2021-01-27: 48 mL via OPHTHALMIC

## 2021-01-27 SURGICAL SUPPLY — 17 items
CANNULA ANT/CHMB 27GA (MISCELLANEOUS) ×4 IMPLANT
GLOVE SURG TRIUMPH 8.0 PF LTX (GLOVE) ×2 IMPLANT
GOWN STRL REUS W/ TWL LRG LVL3 (GOWN DISPOSABLE) ×2 IMPLANT
GOWN STRL REUS W/TWL LRG LVL3 (GOWN DISPOSABLE) ×4
LENS IOL TECNIS EYHANCE 19.5 (Intraocular Lens) ×2 IMPLANT
MARKER SKIN DUAL TIP RULER LAB (MISCELLANEOUS) ×2 IMPLANT
NEEDLE FILTER BLUNT 18X 1/2SAF (NEEDLE) ×1
NEEDLE FILTER BLUNT 18X1 1/2 (NEEDLE) ×1 IMPLANT
PACK EYE AFTER SURG (MISCELLANEOUS) ×2 IMPLANT
PACK OPTHALMIC (MISCELLANEOUS) ×2 IMPLANT
PACK PORFILIO (MISCELLANEOUS) ×2 IMPLANT
SUT ETHILON 10-0 CS-B-6CS-B-6 (SUTURE)
SUTURE EHLN 10-0 CS-B-6CS-B-6 (SUTURE) IMPLANT
SYR 3ML LL SCALE MARK (SYRINGE) ×2 IMPLANT
SYR TB 1ML LUER SLIP (SYRINGE) ×2 IMPLANT
WATER STERILE IRR 250ML POUR (IV SOLUTION) ×2 IMPLANT
WIPE NON LINTING 3.25X3.25 (MISCELLANEOUS) ×2 IMPLANT

## 2021-01-27 NOTE — Op Note (Signed)
PREOPERATIVE DIAGNOSIS:  Nuclear sclerotic cataract of the left eye.   POSTOPERATIVE DIAGNOSIS:  Nuclear sclerotic cataract of the left eye.   OPERATIVE PROCEDURE:ORPROCALL@   SURGEON:  Birder Robson, MD.   ANESTHESIA:  Anesthesiologist: Alisa Graff, MD CRNA: Mayme Genta, CRNA  1.      Managed anesthesia care. 2.     0.20ml of Shugarcaine was instilled following the paracentesis   COMPLICATIONS:  None.   TECHNIQUE:   Stop and chop   DESCRIPTION OF PROCEDURE:  The patient was examined and consented in the preoperative holding area where the aforementioned topical anesthesia was applied to the left eye and then brought back to the Operating Room where the left eye was prepped and draped in the usual sterile ophthalmic fashion and a lid speculum was placed. A paracentesis was created with the side port blade and the anterior chamber was filled with viscoelastic. A near clear corneal incision was performed with the steel keratome. A continuous curvilinear capsulorrhexis was performed with a cystotome followed by the capsulorrhexis forceps. Hydrodissection and hydrodelineation were carried out with BSS on a blunt cannula. The lens was removed in a stop and chop  technique and the remaining cortical material was removed with the irrigation-aspiration handpiece. The capsular bag was inflated with viscoelastic and the Technis [id number] lens was placed in the capsular bag without complication. The remaining viscoelastic was removed from the eye with the irrigation-aspiration handpiece. The wounds were hydrated. The anterior chamber was flushed with BSS and the eye was inflated to physiologic pressure. 0.110ml Vigamox was placed in the anterior chamber. The wounds were found to be water tight. The eye was dressed with Combigan. The patient was given protective glasses to wear throughout the day and a shield with which to sleep tonight. The patient was also given drops with which to begin a drop regimen  today and will follow-up with me in one day. Implant Name Type Inv. Item Serial No. Manufacturer Lot No. LRB No. Used Action  LENS IOL TECNIS EYHANCE 19.5 - [id number] Intraocular Lens LENS IOL TECNIS EYHANCE 19.5 [id number] JOHNSON   Left 1 Implanted    Procedure(s) with comments: CATARACT EXTRACTION PHACO AND INTRAOCULAR LENS PLACEMENT ([facility]) LEFT (Left) - 6.75 00:43.9  Electronically signed: Birder Robson 01/27/2021 12:15 PM

## 2021-01-27 NOTE — Transfer of Care (Addendum)
Immediate Anesthesia Transfer of Care Note  Patient: Nicole Sims  Procedure(s) Performed: CATARACT EXTRACTION PHACO AND INTRAOCULAR LENS PLACEMENT ([facility]) LEFT (Left)  Patient Location: PACU  Anesthesia Type: MAC  Level of Consciousness: awake, alert  and patient cooperative  Airway and Oxygen Therapy: Patient Spontanous Breathing and Patient connected to supplemental oxygen  Post-op Assessment: Post-op Vital signs reviewed, Patient's Cardiovascular Status Stable, Respiratory Function Stable, Patent Airway and No signs of Nausea or vomiting  Post-op Vital Signs: Reviewed and stable  Complications: No notable events documented.

## 2021-01-27 NOTE — Anesthesia Postprocedure Evaluation (Signed)
Anesthesia Post Note  Patient: Nicole Sims  Procedure(s) Performed: CATARACT EXTRACTION PHACO AND INTRAOCULAR LENS PLACEMENT ([facility]) LEFT (Left)     Patient location during evaluation: PACU Anesthesia Type: MAC Level of consciousness: awake and alert Pain management: pain level controlled Vital Signs Assessment: post-procedure vital signs reviewed and stable Respiratory status: spontaneous breathing, nonlabored ventilation, respiratory function stable and patient connected to nasal cannula oxygen Cardiovascular status: stable and blood pressure returned to baseline Postop Assessment: no apparent nausea or vomiting Anesthetic complications: no   No notable events documented.  Alisa Graff

## 2021-01-27 NOTE — Anesthesia Procedure Notes (Signed)
Procedure Name: MAC Date/Time: 01/27/2021 12:02 PM Performed by: Mayme Genta, CRNA Pre-anesthesia Checklist: Patient identified, Emergency Drugs available, Suction available, Timeout performed and Patient being monitored Patient Re-evaluated:Patient Re-evaluated prior to induction Oxygen Delivery Method: Nasal cannula Placement Confirmation: positive ETCO2

## 2021-01-27 NOTE — H&P (Signed)
[name]   Primary Care Physician:  Einar Pheasant, MD Ophthalmologist: Dr.Keyler Hoge  Pre-Procedure History & Physical: HPI:  Nicole Sims is a 70 y.o. female here for cataract surgery.   Past Medical History:  Diagnosis Date   Anemia    Anxiety    Asthma    Cataract cortical, senile    CHF (congestive heart failure) (HCC)    Chronic headaches    Diastolic heart failure (HCC)    Diverticulosis    Environmental allergies    GERD (gastroesophageal reflux disease)    Heart murmur    Hyperglycemia    Hyperlipidemia    Hypertension    Leukocytosis    Obesity    Osteoarthritis    Palpitations    Restless leg syndrome    Sleep apnea    Thrombocytosis    Urinary incontinence    mixed   Venous insufficiency    Vitamin D deficiency    Wears dentures    partial upper    Past Surgical History:  Procedure Laterality Date   BLADDER SURGERY     x2   [facility]   BREAST CYST ASPIRATION Bilateral 2005   approximate year   CARDIAC CATHETERIZATION     [facility]   CATARACT EXTRACTION W/PHACO Right 04/24/2020   Procedure: CATARACT EXTRACTION PHACO AND INTRAOCULAR LENS PLACEMENT ([place]) RIGHT;  Surgeon: Birder Robson, MD;  Location: [place];  Service: Ophthalmology;  Laterality: Right;  6.75 0:37.3   CERVICAL CONE BIOPSY     CIS   CHOLECYSTECTOMY     COLONOSCOPY WITH PROPOFOL N/A 07/19/2016   Procedure: COLONOSCOPY WITH PROPOFOL;  Surgeon: Manya Silvas, MD;  Location: [facility] ENDOSCOPY;  Service: Endoscopy;  Laterality: N/A;   COLONOSCOPY WITH PROPOFOL N/A 10/20/2018   Procedure: COLONOSCOPY WITH PROPOFOL;  Surgeon: Lollie Sails, MD;  Location: [facility] ENDOSCOPY;  Service: Endoscopy;  Laterality: N/A;   ESOPHAGOGASTRODUODENOSCOPY (EGD) WITH PROPOFOL N/A 07/19/2016   Procedure: ESOPHAGOGASTRODUODENOSCOPY (EGD) WITH PROPOFOL;  Surgeon: Manya Silvas, MD;  Location: [facility] ENDOSCOPY;  Service: Endoscopy;  Laterality: N/A;   FLEXIBLE SIGMOIDOSCOPY      KNEE ARTHROSCOPY  08/13/08   knee replacement and revision     left   RECTOCELE REPAIR     RIGHT/LEFT HEART CATH AND CORONARY ANGIOGRAPHY Bilateral 04/10/2018   Procedure: RIGHT/LEFT HEART CATH AND CORONARY ANGIOGRAPHY;  Surgeon: Wellington Hampshire, MD;  Location: [place] CV LAB;  Service: Cardiovascular;  Laterality: Bilateral;   ROTATOR CUFF REPAIR     bilateral   SHOULDER SURGERY  11/17/05   TONSILLECTOMY  1962   VAGINAL HYSTERECTOMY  1974   abnormal pap and carcinoma in situ    Prior to Admission medications   Medication Sig Start Date End Date Taking? Authorizing Provider  albuterol (VENTOLIN HFA) 108 (90 Base) MCG/ACT inhaler Inhale 2 puffs into the lungs every 6 (six) hours as needed for wheezing or shortness of breath. 10/15/19  Yes Einar Pheasant, MD  aspirin EC 325 MG tablet Take 325 mg by mouth daily.    Yes [provider]  baclofen (LIORESAL) 10 MG tablet Take 10 mg by mouth 3 (three) times daily as needed for muscle spasms.   Yes [provider]  budesonide-formoterol (SYMBICORT) 80-4.5 MCG/ACT inhaler Inhale 2 puffs into the lungs 2 (two) times daily. 06/05/20  Yes Einar Pheasant, MD  Cholecalciferol (VITAMIN D-3) 1000 units CAPS Take 1,000 Units by mouth daily.   Yes [provider]  clobetasol cream (TEMOVATE)  0.08 % Apply 1 application topically 2 (two) times daily.   Yes [provider]  fluticasone (FLONASE) 50 MCG/ACT nasal spray Place into both nostrils daily.   Yes [provider]  furosemide (LASIX) 20 MG tablet Take 1 tablet (20 mg) by mouth once daily as needed for weight gain of 3 lbs or more overnight 03/03/20  Yes Dunn, Areta Haber, PA-C  linaclotide (LINZESS) 290 MCG CAPS capsule Take 290 mcg by mouth daily before breakfast.   Yes [provider]  MAGNESIUM CHLORIDE PO Take by mouth.   Yes [provider]  metoprolol succinate (TOPROL-XL) 100 MG 24 hr tablet TAKE 1 TABLET BY MOUTH  DAILY WITH OR  IMMEDIATLEY  FOLLOWING A MEAL 12/08/20  Yes Wellington Hampshire, MD  montelukast (SINGULAIR) 10 MG tablet TAKE 1 TABLET BY MOUTH AT  BEDTIME 01/19/21  Yes Einar Pheasant, MD  ondansetron (ZOFRAN) 4 MG tablet Take 1 tablet (4 mg total) by mouth 2 (two) times daily as needed for nausea or vomiting. 03/15/19  Yes Einar Pheasant, MD  Peppermint Oil (IBGARD) 90 MG CPCR Take by mouth.   Yes [provider]  potassium chloride (KLOR-CON) 10 MEQ tablet Take 1 tablet (10 meq) by mouth once daily as needed only when taking lasix (furosemide) 03/03/20  Yes Dunn, Areta Haber, PA-C  pregabalin (LYRICA) 50 MG capsule Take by mouth. Take 1 capsule by mouth in the morning and 2 capsules at night. 09/12/20 09/12/21 Yes [provider]  RABEprazole (ACIPHEX) 20 MG tablet Take 20 mg by mouth 2 (two) times daily.   Yes [provider]  rosuvastatin (CRESTOR) 10 MG tablet TAKE 1 TABLET BY MOUTH  DAILY 01/19/21  Yes Einar Pheasant, MD  sucralfate (CARAFATE) 1 g tablet Take 1 g by mouth daily as needed (GI sympotms).  11/28/18  Yes [provider]  venlafaxine XR (EFFEXOR-XR) 150 MG 24 hr capsule TAKE 1 CAPSULE BY MOUTH  DAILY WITH BREAKFAST 03/31/20  Yes Einar Pheasant, MD  triamcinolone cream (KENALOG) 0.1 % Apply 1 application topically 2 (two) times daily. 06/27/20   Einar Pheasant, MD    Allergies as of 12/22/2020 - Review Complete 11/08/2020  Allergen Reaction Noted   Augmentin [amoxicillin-pot clavulanate] Other (See Comments) 08/21/2012   Bactrim [sulfamethoxazole-trimethoprim]  08/21/2012   Doxycycline Itching 09/23/2016   Hydrocodone-acetaminophen Other (See Comments) 08/21/2012   Levaquin [levofloxacin]  08/31/2018   Pseudoephedrine  08/21/2012   Shellfish allergy  04/04/2018    Family History  Problem Relation Age of Onset   Diabetes Mellitus II Father    Thyroid disease Father    Alzheimer's disease Father    Breast cancer Maternal Aunt    Hypertension Mother    Heart  Problems Brother    Leukemia Maternal Aunt    Alzheimer's disease Paternal Aunt    Alzheimer's disease Paternal Uncle    Alzheimer's disease Paternal Uncle    Alzheimer's disease Paternal Uncle    Alzheimer's disease Paternal Aunt    Colon cancer Neg Hx    Kidney cancer Neg Hx    Bladder Cancer Neg Hx     Social History   Socioeconomic History   Marital status: Married    Spouse name: Not on file   Number of children: Not on file   Years of education: Not on file   Highest education level: Not on file  Occupational History   Occupation: retired  Tobacco Use   Smoking status: Former    Packs/day: 1.00  Years: 15.00    Pack years: 15.00    Types: Cigarettes    Quit date: 08/17/1983    Years since quitting: 37.4   Smokeless tobacco: Never  Vaping Use   Vaping Use: Never used  Substance and Sexual Activity   Alcohol use: Yes    Alcohol/week: 0.0 standard drinks    Comment: rarely   Drug use: No   Sexual activity: Not on file  Other Topics Concern   Not on file  Social History Narrative   Lives at home with husband   Social Determinants of Health   Financial Resource Strain: Not on file  Food Insecurity: Not on file  Transportation Needs: Not on file  Physical Activity: Not on file  Stress: Not on file  Social Connections: Not on file  Intimate Partner Violence: Not on file    Review of Systems: See HPI, otherwise negative ROS  Physical Exam: BP 139/71   Pulse 70   Temp (!) 97.3 F (36.3 C)   Ht 5\' 5"  (1.651 m)   Wt 112.5 kg   SpO2 99%   BMI 41.27 kg/m  General:   Alert,  pleasant and cooperative in NAD Head:  Normocephalic and atraumatic. Respiratory:  Normal work of breathing. Cardiovascular:  RRR  Impression/Plan: Nicole Sims is here for cataract surgery.  Risks, benefits, limitations, and alternatives regarding cataract surgery have been reviewed with the patient.  Questions have been answered.  All parties agreeable.   Birder Robson, MD  01/27/2021, 11:51 AM

## 2021-01-27 NOTE — Anesthesia Preprocedure Evaluation (Signed)
Anesthesia Evaluation  Patient identified by MRN, date of birth, ID band Patient awake    Reviewed: Allergy & Precautions, H&P , NPO status , Patient's Chart, lab work & pertinent test results, reviewed documented beta blocker date and time   Airway Mallampati: II  TM Distance: >3 FB Neck ROM: full    Dental  (+) Partial Upper   Pulmonary asthma , sleep apnea , former smoker,    Pulmonary exam normal breath sounds clear to auscultation       Cardiovascular Exercise Tolerance: Good hypertension, +CHF   Rhythm:regular Rate:Normal     Neuro/Psych  Headaches, Anxiety Restless leg syndrome    GI/Hepatic Neg liver ROS, GERD  ,  Endo/Other  negative endocrine ROS  Renal/GU negative Renal ROS  negative genitourinary   Musculoskeletal   Abdominal   Peds  Hematology negative hematology ROS (+)   Anesthesia Other Findings   Reproductive/Obstetrics negative OB ROS                             Anesthesia Physical Anesthesia Plan  ASA: 2  Anesthesia Plan: MAC   Post-op Pain Management:    Induction:   PONV Risk Score and Plan: 2 and Treatment may vary due to age or medical condition  Airway Management Planned:   Additional Equipment:   Intra-op Plan:   Post-operative Plan:   Informed Consent: I have reviewed the patients History and Physical, chart, labs and discussed the procedure including the risks, benefits and alternatives for the proposed anesthesia with the patient or authorized representative who has indicated his/her understanding and acceptance.     Dental Advisory Given  Plan Discussed with: CRNA  Anesthesia Plan Comments:         Anesthesia Quick Evaluation

## 2021-01-28 ENCOUNTER — Telehealth: Payer: Self-pay

## 2021-01-28 ENCOUNTER — Encounter: Payer: Self-pay | Admitting: Ophthalmology

## 2021-01-28 ENCOUNTER — Telehealth: Payer: Self-pay | Admitting: Internal Medicine

## 2021-01-28 DIAGNOSIS — R197 Diarrhea, unspecified: Secondary | ICD-10-CM | POA: Diagnosis not present

## 2021-01-28 DIAGNOSIS — Z20822 Contact with and (suspected) exposure to covid-19: Secondary | ICD-10-CM | POA: Diagnosis not present

## 2021-01-28 NOTE — Telephone Encounter (Signed)
Pt called and wanted to let Dr Nicki Reaper know what they told her at [facility] urgent care. She would like you to call her back.

## 2021-01-28 NOTE — Telephone Encounter (Signed)
Scheduled pt for 01/30/21  Pt stated that she has been losing 1-2lbs a day for the last week

## 2021-01-28 NOTE — Telephone Encounter (Signed)
FYI update on patient. Seeing you on Friday but is going today to be evaluated today  Called patient back about appt that was scheduled for this Friday. She is only having about 1-2 stools a day now that are loose and is eating and drinking ok but still states that she is feeling a little weaker than normal. She was advised to go to urgent care on Friday 6/10 and stated that she would go if symptoms worsened but she says she really is not feeling worse- just a little weak and losing weight (about a 1/2-1 lbs per day). Advised that given her symptoms and that she had diarrhea for a week she needs to go to urgent care today to confirm not dehydrated and that nothing acute is going on and that we can keep appt for Friday to follow up with Dr Nicki Reaper. She stated that she was wanting to talk to Dr Nicki Reaper about the weight loss but she did agree to go and be evaluated today to confirm nothing more acute is going on. She is supposed to call me with an update after she has been evaluated at urgent care. Her son is coming to get her to take her.

## 2021-01-28 NOTE — Telephone Encounter (Signed)
Update sent to Dr Nicki Reaper on patient. Appt has been scheduled with Dr Nicki Reaper and patient is going to urgent care to confirm nothing more acute is going on as she was advised to do on 6/10. Holding off on checking urine right now. Can check on Friday if passes screening to come into office.

## 2021-01-28 NOTE — Telephone Encounter (Signed)
Agree with evaluation today. Please f/u and confirm pt seen and confirm doing ok.

## 2021-01-29 MED ORDER — FLUTICASONE PROPIONATE 50 MCG/ACT NA SUSP
2.0000 | Freq: Every day | NASAL | 3 refills | Status: DC
Start: 2021-01-29 — End: 2022-02-23

## 2021-01-29 NOTE — Telephone Encounter (Signed)
Called patient to let her know that I have reviewed her UC notes and labs did confirm that she is not dehydrated. Diarrhea has resolved. She noted that she lost 13 pounds since last Tuesday but when she weighed this morning she has gained back 1/2 of a pound. Eating and drinking ok. She is still doing a bland diet. She is going to keep her appt tomorrow because she also noted that over the last couple of months her husband has noticed that she has been more forgetful so she wanted to discuss her memory as well. Confirmed that this is not an acute change. Also, she is requesting a refill on clobetasol and flonase. She uses both prn. Ok to fill? Clobetasol goes to local pharmacy and Flonase goes to [facility]. I can send in if ok with you.

## 2021-01-29 NOTE — Telephone Encounter (Signed)
Per chart patient was seen. See other note.

## 2021-01-29 NOTE — Telephone Encounter (Signed)
Meant to send it to Dr. Nicki Reaper

## 2021-01-29 NOTE — Addendum Note (Signed)
Addended by: Adair Laundry on: 01/29/2021 04:46 PM   Modules accepted: Orders

## 2021-01-29 NOTE — Telephone Encounter (Signed)
Ok to refill clobetasol x 1, but will need to f/u with gyn for continued care and treatment (and refills)

## 2021-01-29 NOTE — Telephone Encounter (Signed)
Ok to send in flonase.  Who has been prescribing clobetasol?

## 2021-01-29 NOTE — Telephone Encounter (Signed)
Flonase has been refilled. Pt stated that Pauline Good at [facility] was prescribing it but she has left the practice. Pt stated that she is going to be seeing Dr. Leafy Ro but hasn't been able to schedule an appt with yet due to being sick and having so many other appts.

## 2021-01-30 ENCOUNTER — Ambulatory Visit (INDEPENDENT_AMBULATORY_CARE_PROVIDER_SITE_OTHER): Payer: Medicare Other | Admitting: Internal Medicine

## 2021-01-30 ENCOUNTER — Other Ambulatory Visit: Payer: Self-pay

## 2021-01-30 DIAGNOSIS — K219 Gastro-esophageal reflux disease without esophagitis: Secondary | ICD-10-CM

## 2021-01-30 DIAGNOSIS — E7800 Pure hypercholesterolemia, unspecified: Secondary | ICD-10-CM | POA: Diagnosis not present

## 2021-01-30 DIAGNOSIS — I5030 Unspecified diastolic (congestive) heart failure: Secondary | ICD-10-CM

## 2021-01-30 DIAGNOSIS — R739 Hyperglycemia, unspecified: Secondary | ICD-10-CM

## 2021-01-30 DIAGNOSIS — N1831 Chronic kidney disease, stage 3a: Secondary | ICD-10-CM

## 2021-01-30 DIAGNOSIS — D649 Anemia, unspecified: Secondary | ICD-10-CM | POA: Diagnosis not present

## 2021-01-30 DIAGNOSIS — D473 Essential (hemorrhagic) thrombocythemia: Secondary | ICD-10-CM

## 2021-01-30 DIAGNOSIS — I10 Essential (primary) hypertension: Secondary | ICD-10-CM | POA: Diagnosis not present

## 2021-01-30 DIAGNOSIS — R197 Diarrhea, unspecified: Secondary | ICD-10-CM

## 2021-01-30 DIAGNOSIS — L900 Lichen sclerosus et atrophicus: Secondary | ICD-10-CM

## 2021-01-30 DIAGNOSIS — R634 Abnormal weight loss: Secondary | ICD-10-CM | POA: Diagnosis not present

## 2021-01-30 DIAGNOSIS — I700 Atherosclerosis of aorta: Secondary | ICD-10-CM

## 2021-01-30 MED ORDER — CLOBETASOL PROPIONATE 0.05 % EX CREA: 1.0000 "application " | TOPICAL_CREAM | Freq: Two times a day (BID) | CUTANEOUS | 1 refills | Status: DC

## 2021-01-30 NOTE — Progress Notes (Addendum)
Patient ID: Nicole Sims, female   DOB: 1951/06/13, 70 y.o.   MRN: [id number]   Virtual Visit via telephone Note  This visit type was conducted due to national recommendations for restrictions regarding the COVID-19 pandemic (e.g. social distancing).  This format is felt to be most appropriate for this patient at this time.  All issues noted in this document were discussed and addressed.  No physical exam was performed (except for noted visual exam findings with Video Visits).   I connected with Nicole Sims today by telephone and verified that I am speaking with the correct person using two identifiers. Location patient: home Location provider: work  Persons participating in the telephone visit: patient, provider  The limitations, risks, security and privacy concerns of performing an evaluation and management service by telephone and the availability of in person appointments have been discussed.  It has also been discussed with the patient that there may be a patient responsible charge related to this service. The patient expressed understanding and agreed to proceed.   Reason for visit:  work in appt  HPI: Work in appt to discuss weight loss and diarrhea.  Diarrhea started 01/20/21.  Watery stool.  4-5 episodes per night and maybe one during day.  No blood.  Some nausea.  No vomiting.  Stomach "gripey".  Abdominal cramping.  Started [employer].  Given persistent symptoms and weakness, evaluated at acute care 01/28/21.  Prescribed zofran.  Taking prn.  Covid test negative.  Taking pepto bismol.  Was doing better.  Ate salmon and rice - symptoms recurred - not as bad.  No blood.  Eating.  Weight 223 pounds.  In reviewing, weight 236 pounds 11/2020 and 240 pounds 01/02/21.  Breathing stable.     ROS: See pertinent positives and negatives per HPI.  Past Medical History:  Diagnosis Date   Anemia    Anxiety    Asthma    Cataract cortical, senile    CHF (congestive heart failure) (HCC)    Chronic  headaches    Diastolic heart failure (HCC)    Diverticulosis    Environmental allergies    GERD (gastroesophageal reflux disease)    Heart murmur    Hyperglycemia    Hyperlipidemia    Hypertension    Leukocytosis    Obesity    Osteoarthritis    Palpitations    Restless leg syndrome    Sleep apnea    Thrombocytosis    Urinary incontinence    mixed   Venous insufficiency    Vitamin D deficiency    Wears dentures    partial upper    Past Surgical History:  Procedure Laterality Date   BLADDER SURGERY     x2   [facility]   BREAST CYST ASPIRATION Bilateral 2005   approximate year   CARDIAC CATHETERIZATION     [place]   CATARACT EXTRACTION W/PHACO Right 04/24/2020   Procedure: CATARACT EXTRACTION PHACO AND INTRAOCULAR LENS PLACEMENT ([place]) RIGHT;  Surgeon: Birder Robson, MD;  Location: [place];  Service: Ophthalmology;  Laterality: Right;  6.75 0:37.3   CATARACT EXTRACTION W/PHACO Left 01/27/2021   Procedure: CATARACT EXTRACTION PHACO AND INTRAOCULAR LENS PLACEMENT ([place]) LEFT;  Surgeon: Birder Robson, MD;  Location: [place];  Service: Ophthalmology;  Laterality: Left;  6.75 00:43.9   CERVICAL CONE BIOPSY     CIS   CHOLECYSTECTOMY     COLONOSCOPY WITH PROPOFOL N/A 07/19/2016   Procedure: COLONOSCOPY WITH PROPOFOL;  Surgeon: Manya Silvas,  MD;  Location: [facility] ENDOSCOPY;  Service: Endoscopy;  Laterality: N/A;   COLONOSCOPY WITH PROPOFOL N/A 10/20/2018   Procedure: COLONOSCOPY WITH PROPOFOL;  Surgeon: Lollie Sails, MD;  Location: [facility] ENDOSCOPY;  Service: Endoscopy;  Laterality: N/A;   ESOPHAGOGASTRODUODENOSCOPY (EGD) WITH PROPOFOL N/A 07/19/2016   Procedure: ESOPHAGOGASTRODUODENOSCOPY (EGD) WITH PROPOFOL;  Surgeon: Manya Silvas, MD;  Location: [facility] ENDOSCOPY;  Service: Endoscopy;  Laterality: N/A;   FLEXIBLE SIGMOIDOSCOPY     KNEE ARTHROSCOPY  08/13/08   knee replacement and revision     left   RECTOCELE REPAIR     RIGHT/LEFT  HEART CATH AND CORONARY ANGIOGRAPHY Bilateral 04/10/2018   Procedure: RIGHT/LEFT HEART CATH AND CORONARY ANGIOGRAPHY;  Surgeon: Wellington Hampshire, MD;  Location: [place] CV LAB;  Service: Cardiovascular;  Laterality: Bilateral;   ROTATOR CUFF REPAIR     bilateral   SHOULDER SURGERY  11/17/05   TONSILLECTOMY  1962   VAGINAL HYSTERECTOMY  1974   abnormal pap and carcinoma in situ    Family History  Problem Relation Age of Onset   Diabetes Mellitus II Father    Thyroid disease Father    Alzheimer's disease Father    Breast cancer Maternal Aunt    Hypertension Mother    Heart Problems Brother    Leukemia Maternal Aunt    Alzheimer's disease Paternal Aunt    Alzheimer's disease Paternal Uncle    Alzheimer's disease Paternal Uncle    Alzheimer's disease Paternal Uncle    Alzheimer's disease Paternal Aunt    Colon cancer Neg Hx    Kidney cancer Neg Hx    Bladder Cancer Neg Hx     SOCIAL HX: reviewed.    Current Outpatient Medications:    albuterol (VENTOLIN HFA) 108 (90 Base) MCG/ACT inhaler, Inhale 2 puffs into the lungs every 6 (six) hours as needed for wheezing or shortness of breath., Disp: 18 g, Rfl: 1   aspirin EC 325 MG tablet, Take 325 mg by mouth daily. , Disp: , Rfl:    baclofen (LIORESAL) 10 MG tablet, Take 10 mg by mouth 3 (three) times daily as needed for muscle spasms., Disp: , Rfl:    budesonide-formoterol (SYMBICORT) 80-4.5 MCG/ACT inhaler, Inhale 2 puffs into the lungs 2 (two) times daily., Disp: 1 each, Rfl: 0   Cholecalciferol (VITAMIN D-3) 1000 units CAPS, Take 1,000 Units by mouth daily., Disp: , Rfl:    clobetasol cream (TEMOVATE) 1.63 %, Apply 1 application topically 2 (two) times daily., Disp: 30 g, Rfl: 1   fluticasone (FLONASE) 50 MCG/ACT nasal spray, Place 2 sprays into both nostrils daily., Disp: 48 g, Rfl: 3   furosemide (LASIX) 20 MG tablet, Take 1 tablet (20 mg) by mouth once daily as needed for weight gain of 3 lbs or more overnight, Disp: , Rfl:     linaclotide (LINZESS) 290 MCG CAPS capsule, Take 290 mcg by mouth daily before breakfast., Disp: , Rfl:    metoprolol succinate (TOPROL-XL) 100 MG 24 hr tablet, TAKE 1 TABLET BY MOUTH  DAILY WITH OR IMMEDIATLEY  FOLLOWING A MEAL, Disp: 90 tablet, Rfl: 3   montelukast (SINGULAIR) 10 MG tablet, TAKE 1 TABLET BY MOUTH AT  BEDTIME, Disp: 90 tablet, Rfl: 1   ondansetron (ZOFRAN) 4 MG tablet, Take 1 tablet (4 mg total) by mouth 2 (two) times daily as needed for nausea or vomiting., Disp: 15 tablet, Rfl: 0   Peppermint Oil (IBGARD) 90 MG CPCR, Take by mouth., Disp: , Rfl:  potassium chloride (KLOR-CON) 10 MEQ tablet, Take 1 tablet (10 meq) by mouth once daily as needed only when taking lasix (furosemide), Disp: , Rfl:    pregabalin (LYRICA) 50 MG capsule, Take by mouth. Take 1 capsule by mouth in the morning and 2 capsules at night., Disp: , Rfl:    RABEprazole (ACIPHEX) 20 MG tablet, Take 20 mg by mouth 2 (two) times daily., Disp: , Rfl:    rosuvastatin (CRESTOR) 10 MG tablet, TAKE 1 TABLET BY MOUTH  DAILY, Disp: 90 tablet, Rfl: 1   sucralfate (CARAFATE) 1 g tablet, Take 1 g by mouth daily as needed (GI sympotms). , Disp: , Rfl:    triamcinolone cream (KENALOG) 0.1 %, Apply 1 application topically 2 (two) times daily., Disp: 30 g, Rfl: 0   venlafaxine XR (EFFEXOR-XR) 150 MG 24 hr capsule, TAKE 1 CAPSULE BY MOUTH  DAILY WITH BREAKFAST, Disp: 90 capsule, Rfl: 3  EXAM:  GENERAL: alert. Sounds to be in no acute distress.  Answering questions appropriately.   PSYCH/NEURO: pleasant and cooperative, no obvious depression or anxiety, speech and thought processing grossly intact  ASSESSMENT AND PLAN:  Discussed the following assessment and plan:  Problem List Items Addressed This Visit     Anemia    Follow cbc.        Aortic atherosclerosis (HCC)    Continue crestor.        CKD (chronic kidney disease) stage 3, GFR 30-59 ml/min (HCC)    Recent GFR 55.  Stay hydrated.  Avoid  antiinflammatories.  Follow metabolic panel.        Diarrhea    Persistent loose stool, bowel change.  Appears to be some better.  Discussed taking fiber and probiotics.  Continue bland foods and advance diet slowly.  Labs recently unrevealing.  Discussed f/u evaluation with GI given persistence.  Colonoscopy 10/2018.  No increased abdominal pain.  Bollow.         Diastolic heart failure (HCC)    History of diastolic dysfunction.  EF 60-65%.  No sob reported.  Follow.        Essential (hemorrhagic) thrombocythemia ([place])    Worked up previously by hematology.  Follow cbc.  Felt to be reactive.         Essential hypertension    Continue toprol.  Follow blood pressure.  Follow metabolic panel.        Gastroesophageal reflux disease    Continue aciphex.  Nausea.  Has zofran to take prn.  Eating.  Follow.  Given persistent symptoms, recommend GI evaluation.        Hypercholesteremia    Continue crestor.  Low cholesterol diet and exercise.  Follow lipid panel and liver function tests.         Hyperglycemia    Low carb diet and exercise.  Follow met b and a1c.        Lichen sclerosus    Had pap 02/2020 - ok with negative HPV.  Has been seeing gyn.  Request refill clobetasol.  Ok to refill x 1.  F/u with gyn as discussed.         Weight loss    Weight loss as outlined.  Discussed treating diarrhea.  F/u with GI as outlined.  If persistent, consider CT scan.  No increased abdominal pain.         Return if symptoms worsen or fail to improve, for keep scheduled.   I discussed the assessment and treatment plan with the patient. The patient  was provided an opportunity to ask questions and all were answered. The patient agreed with the plan and demonstrated an understanding of the instructions.   The patient was advised to call back or seek an in-person evaluation if the symptoms worsen or if the condition fails to improve as anticipated.  I provided 23 minutes of  non-face-to-face time during this encounter.   Einar Pheasant, MD

## 2021-01-30 NOTE — Telephone Encounter (Signed)
I called patient to let her know that clobetasol cream sent to pharmacy, but needed to refer to GYN for in the future.

## 2021-01-30 NOTE — Progress Notes (Deleted)
Patient ID: Nicole Sims, female   DOB: 1951/06/01, 70 y.o.   MRN: [id number]

## 2021-01-31 ENCOUNTER — Encounter: Payer: Self-pay | Admitting: Internal Medicine

## 2021-01-31 ENCOUNTER — Telehealth: Payer: Self-pay | Admitting: Internal Medicine

## 2021-01-31 DIAGNOSIS — R634 Abnormal weight loss: Secondary | ICD-10-CM | POA: Insufficient documentation

## 2021-01-31 DIAGNOSIS — R197 Diarrhea, unspecified: Secondary | ICD-10-CM | POA: Insufficient documentation

## 2021-01-31 NOTE — Assessment & Plan Note (Signed)
Worked up previously by hematology.  Follow cbc.  Felt to be reactive.

## 2021-01-31 NOTE — Assessment & Plan Note (Signed)
Persistent loose stool, bowel change.  Appears to be some better.  Discussed taking fiber and probiotics.  Continue bland foods and advance diet slowly.  Labs recently unrevealing.  Discussed f/u evaluation with GI given persistence.  Colonoscopy 10/2018.  No increased abdominal pain.  Bollow.

## 2021-01-31 NOTE — Assessment & Plan Note (Signed)
Continue aciphex.  Nausea.  Has zofran to take prn.  Eating.  Follow.  Given persistent symptoms, recommend GI evaluation.

## 2021-01-31 NOTE — Assessment & Plan Note (Signed)
Low carb diet and exercise.  Follow met b and a1c.  

## 2021-01-31 NOTE — Assessment & Plan Note (Signed)
History of diastolic dysfunction.  EF 60-65%.  No sob reported.  Follow.

## 2021-01-31 NOTE — Assessment & Plan Note (Signed)
Had pap 02/2020 - ok with negative HPV.  Has been seeing gyn.  Request refill clobetasol.  Ok to refill x 1.  F/u with gyn as discussed.

## 2021-01-31 NOTE — Telephone Encounter (Signed)
Please confirm she has a f/u appt with GI for weight loss and persistent diarrhea.

## 2021-01-31 NOTE — Assessment & Plan Note (Signed)
Continue toprol.  Follow blood pressure.  Follow metabolic panel.

## 2021-01-31 NOTE — Assessment & Plan Note (Signed)
Weight loss as outlined.  Discussed treating diarrhea.  F/u with GI as outlined.  If persistent, consider CT scan.  No increased abdominal pain.

## 2021-01-31 NOTE — Assessment & Plan Note (Signed)
Continue crestor 

## 2021-01-31 NOTE — Assessment & Plan Note (Signed)
Follow cbc.  

## 2021-01-31 NOTE — Assessment & Plan Note (Signed)
Continue crestor.  Low cholesterol diet and exercise. Follow lipid panel and liver function tests.   

## 2021-01-31 NOTE — Assessment & Plan Note (Signed)
Recent GFR 55.  Stay hydrated.  Avoid antiinflammatories.  Follow metabolic panel.

## 2021-02-02 NOTE — Telephone Encounter (Signed)
Left message to call back  

## 2021-02-03 NOTE — Telephone Encounter (Signed)
Pt will call Stephens November, FNP's office to schedule a f/u for her persistent diarrhea as well as weight loss.

## 2021-02-03 NOTE — Telephone Encounter (Signed)
Pt returned [place] call about GI appt-pt states that she does not have an appt and did not know she was supposed to have one.

## 2021-02-04 DIAGNOSIS — R1314 Dysphagia, pharyngoesophageal phase: Secondary | ICD-10-CM | POA: Diagnosis not present

## 2021-02-04 DIAGNOSIS — R197 Diarrhea, unspecified: Secondary | ICD-10-CM | POA: Diagnosis not present

## 2021-02-04 DIAGNOSIS — K219 Gastro-esophageal reflux disease without esophagitis: Secondary | ICD-10-CM | POA: Diagnosis not present

## 2021-02-05 DIAGNOSIS — R197 Diarrhea, unspecified: Secondary | ICD-10-CM | POA: Diagnosis not present

## 2021-02-06 DIAGNOSIS — M47816 Spondylosis without myelopathy or radiculopathy, lumbar region: Secondary | ICD-10-CM | POA: Diagnosis not present

## 2021-02-09 ENCOUNTER — Telehealth: Payer: Self-pay | Admitting: Internal Medicine

## 2021-02-09 NOTE — Telephone Encounter (Signed)
Called pt to let her know mammogram was overdue. Pt states she will call and schedule. She has a lot of appts coming up.

## 2021-02-09 NOTE — Telephone Encounter (Signed)
Received notification that she was overdue mammogram.  Need to schedule.  Thanks.

## 2021-02-18 ENCOUNTER — Other Ambulatory Visit: Payer: Self-pay | Admitting: Internal Medicine

## 2021-02-18 DIAGNOSIS — Z1231 Encounter for screening mammogram for malignant neoplasm of breast: Secondary | ICD-10-CM

## 2021-02-25 DIAGNOSIS — M47816 Spondylosis without myelopathy or radiculopathy, lumbar region: Secondary | ICD-10-CM | POA: Diagnosis not present

## 2021-02-27 ENCOUNTER — Ambulatory Visit
Admission: RE | Admit: 2021-02-27 | Discharge: 2021-02-27 | Disposition: A | Discharge status: Home / Self Care | Payer: Medicare Other | Source: Ambulatory Visit | Attending: Internal Medicine | Admitting: Internal Medicine

## 2021-02-27 ENCOUNTER — Other Ambulatory Visit: Payer: Self-pay

## 2021-02-27 DIAGNOSIS — Z1231 Encounter for screening mammogram for malignant neoplasm of breast: Secondary | ICD-10-CM | POA: Diagnosis not present

## 2021-03-05 ENCOUNTER — Other Ambulatory Visit: Payer: Self-pay | Admitting: Internal Medicine

## 2021-03-05 IMAGING — CT CT BIOPSY AND ASPIRATION BONE MARROW
1 of 2 series · 9 of 14 positions shown, 12 images · non-contrast
Comparison: none

CLINICAL DATA: Thrombocytosis and iron deficiency anemia. Bone
marrow biopsy requested for further hematologic workup.

[Series 2: i-spiral 5.0 b30f · axial · 0.86mm/px · z∈[-168,-80]mm · 9 of 33 slices shown, 12 images]
[im 4/33  soft-tissue]
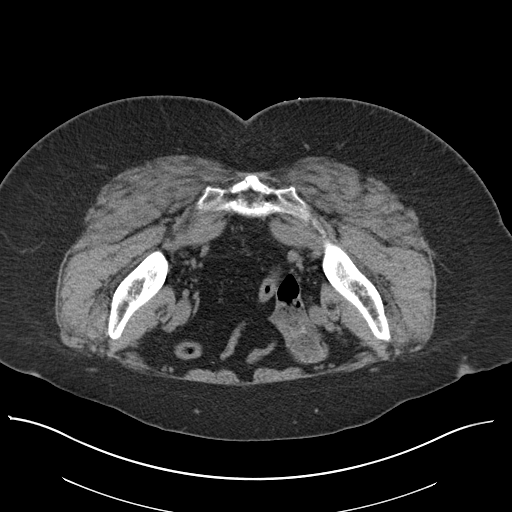
[im 4/33  bone]
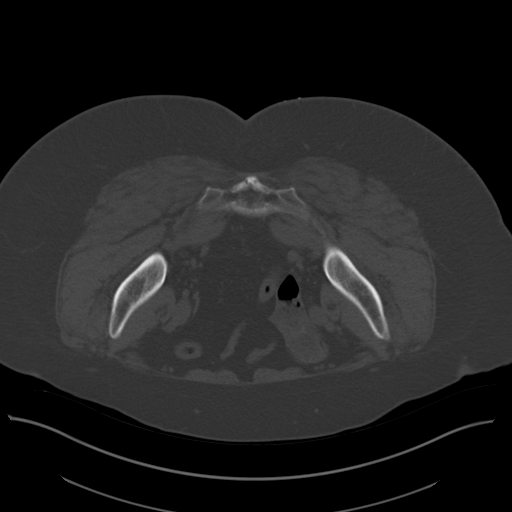
[im 7/33  bone]
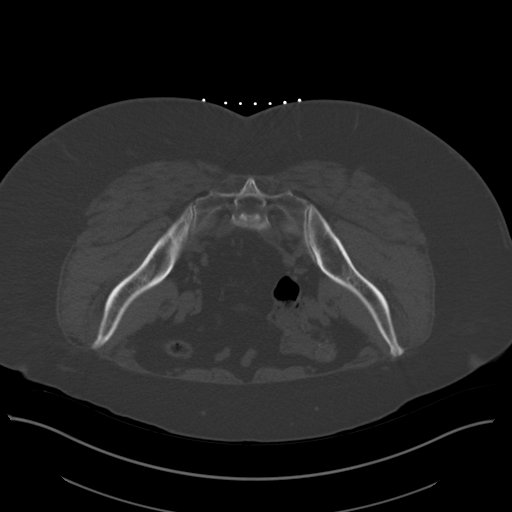
[im 10/33  bone]
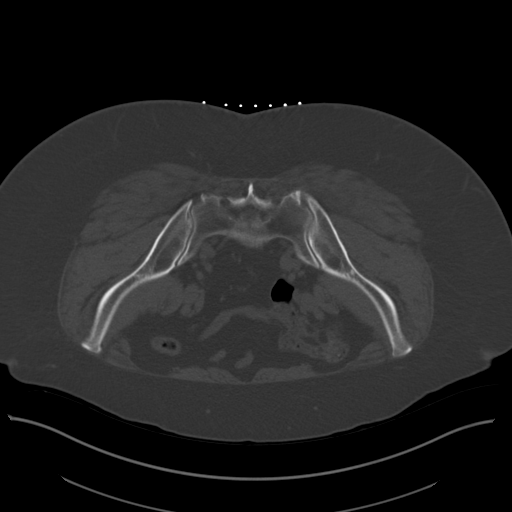
[im 13/33  bone]
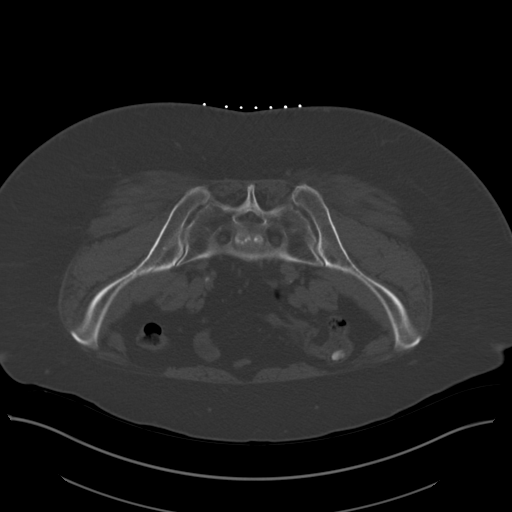
[im 17/33  soft-tissue]
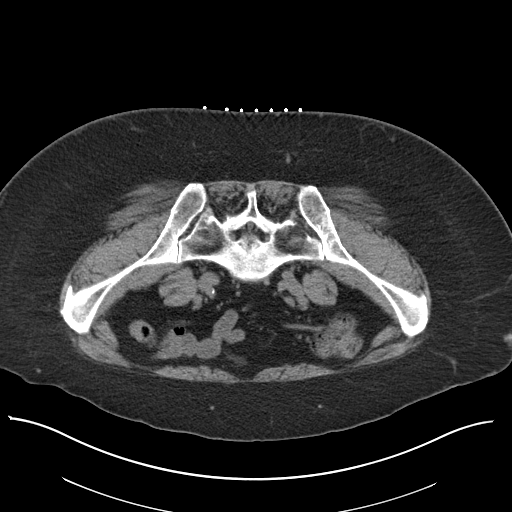
[im 17/33  bone]
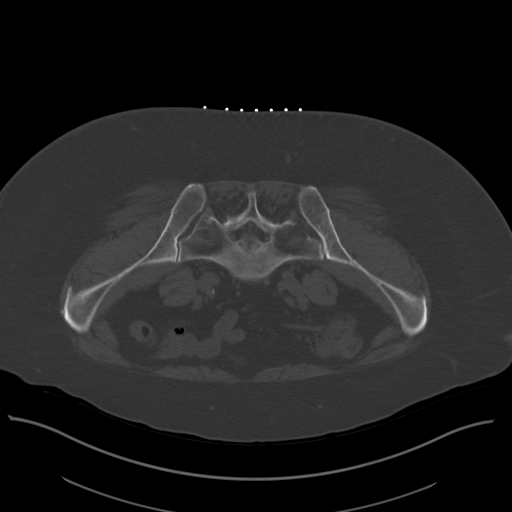
[im 20/33  bone]
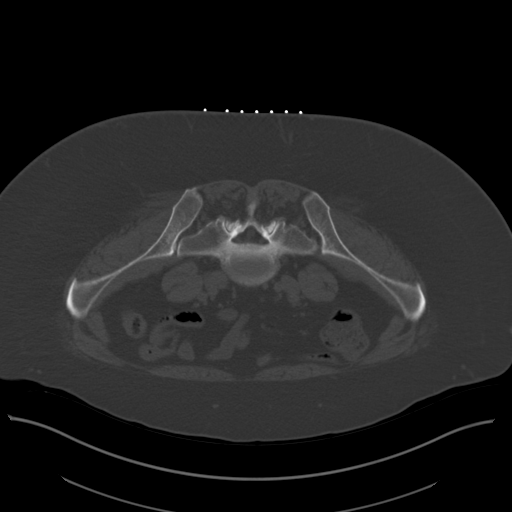
[im 23/33  bone]
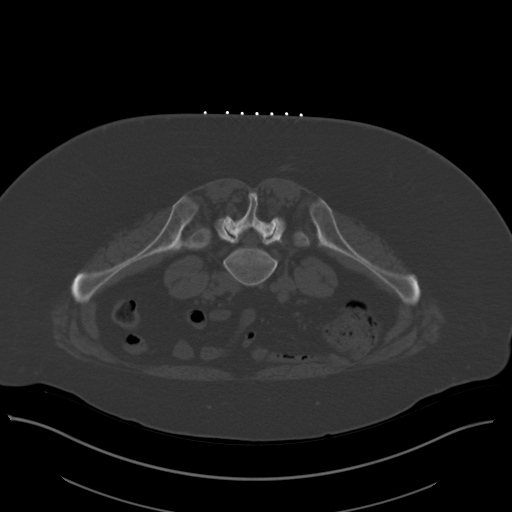
[im 26/33  bone]
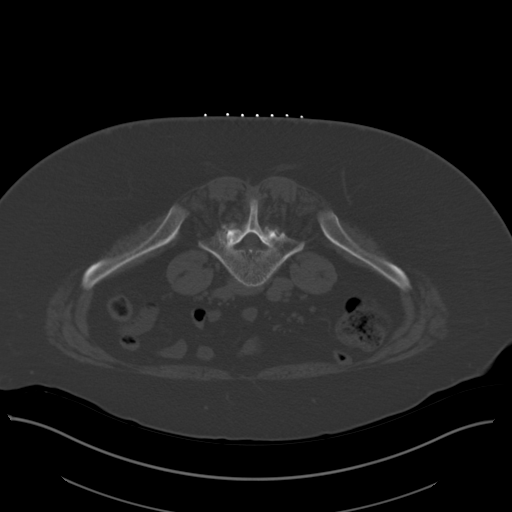
[im 29/33  soft-tissue]
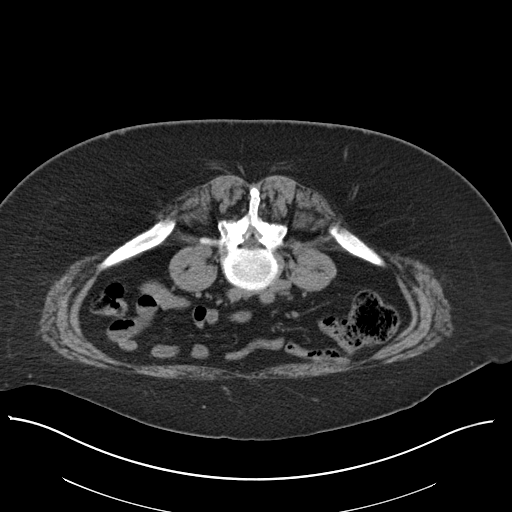
[im 29/33  bone]
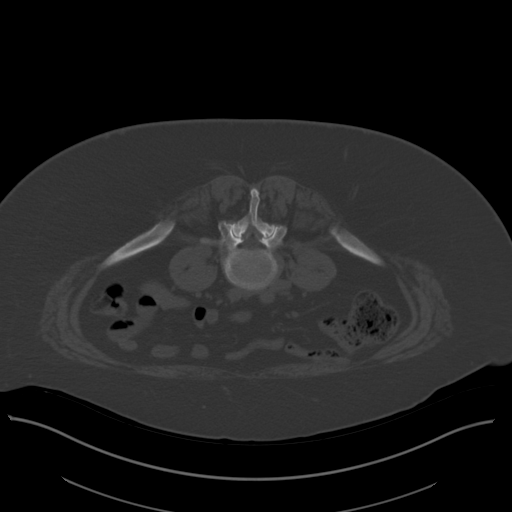

[9 of 14 positions shown; findings below may reference images not displayed]

EXAM:
CT GUIDED BONE MARROW ASPIRATION AND BIOPSY

ANESTHESIA/SEDATION:
Versed 2.0 mg IV, Fentanyl 100 mcg IV

Total Moderate Sedation Time:   15 minutes.

The patient's level of consciousness and physiologic status were
continuously monitored during the procedure by Radiology nursing.

PROCEDURE:
The procedure risks, benefits, and alternatives were explained to
the patient. Questions regarding the procedure were encouraged and
answered. The patient understands and consents to the procedure. A
time out was performed prior to initiating the procedure.

The right gluteal region was prepped with chlorhexidine. Sterile
gown and sterile gloves were used for the procedure. Local
anesthesia was provided with 1% Lidocaine.

Under CT guidance, an 11 gauge On Control bone cutting needle was
advanced from a posterior approach into the right iliac bone. Needle
positioning was confirmed with CT. Initial non heparinized and
heparinized aspirate samples were obtained of bone marrow. Core
biopsy was performed via the On Control drill needle.

COMPLICATIONS:
None
FINDINGS: Inspection of initial aspirate did reveal visible particles. Intact
core biopsy sample was obtained.
IMPRESSION: CT guided bone marrow biopsy of right posterior iliac bone with both
aspirate and core samples obtained.

## 2021-03-19 ENCOUNTER — Encounter: Payer: Medicare Other | Admitting: Internal Medicine

## 2021-03-19 IMAGING — MR MR HEAD W/O CM
9 of 10 series · 40 of 48 positions shown · non-contrast
Comparison: Head CT 06/25/2019, brain MRI 05/26/2015

CLINICAL DATA: Ataxia, stroke suspected. Cerebral aneurysm,
subarachnoid hemorrhage, cerebral vasospasm evaluation. Additional
history provided: Patient presents to ED for weakness beginning
yesterday with difficulty walking.

EXAM:
MRI HEAD WITHOUT CONTRAST
MRA HEAD WITHOUT CONTRAST
TECHNIQUE: Multiplanar, multiecho pulse sequences of the brain and surrounding
structures were obtained without intravenous contrast. Angiographic
images of the head were obtained using MRA technique without
contrast.

[Series 5: ax dwi_tracew · axial · 3.0mm · 0.83mm/px · z∈[-120,+44]mm · 7 of 56 slices shown]
[im 1/56]
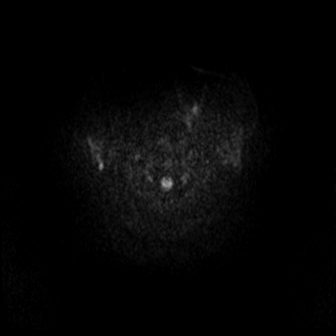
[im 10/56]
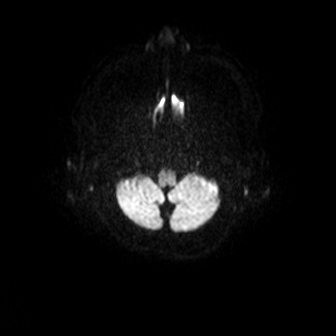
[im 19/56]
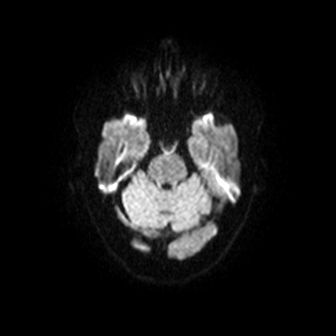
[im 28/56]
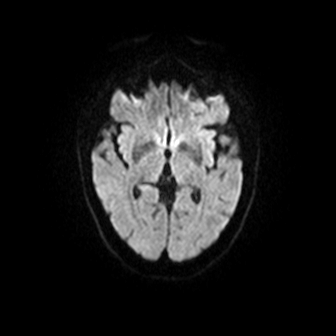
[im 37/56]
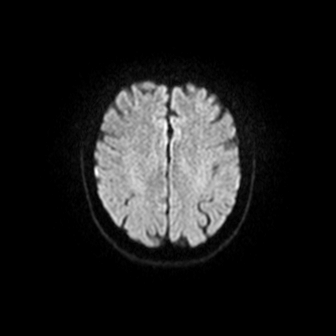
[im 46/56]
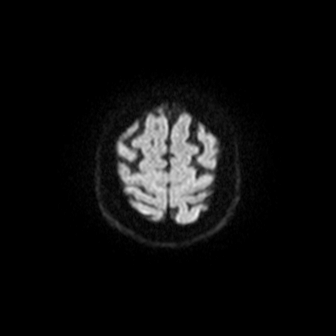
[im 56/56]
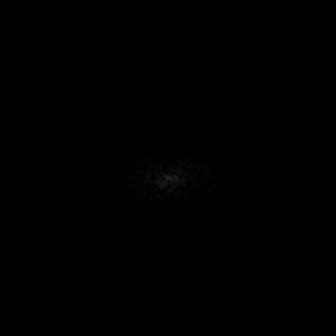

[Series 6: ax dwi_adc · axial · 3.0mm · 0.83mm/px · z∈[-120,+44]mm · 8 of 56 slices shown]
[im 1/56]
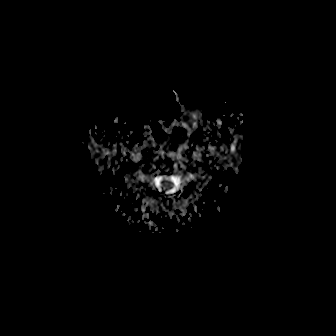
[im 8/56]
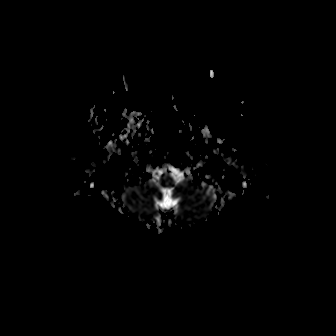
[im 16/56]
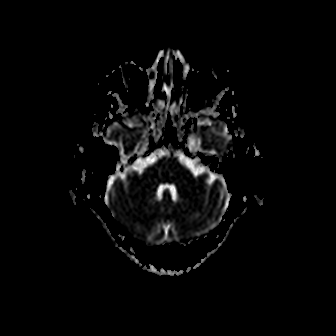
[im 24/56]
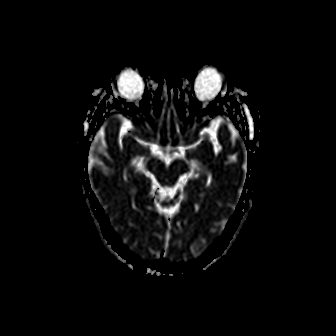
[im 32/56]
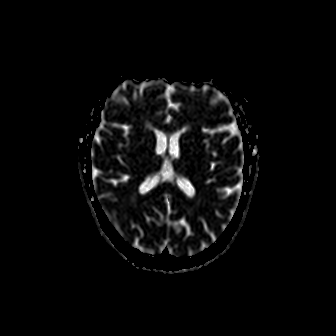
[im 40/56]
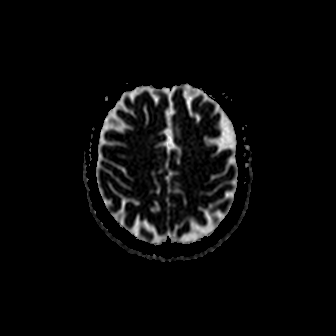
[im 48/56]
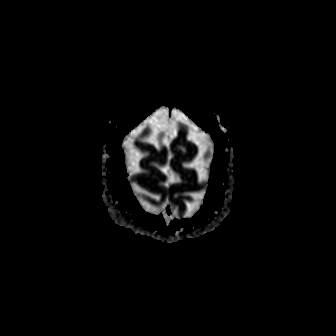
[im 56/56]
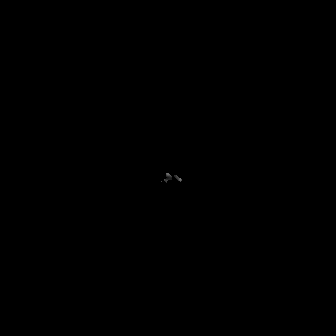

[Series 7: cor dwi_tracew · coronal · 5.0mm · 0.68mm/px · 5 of 40 slices shown]
[im 1/40]
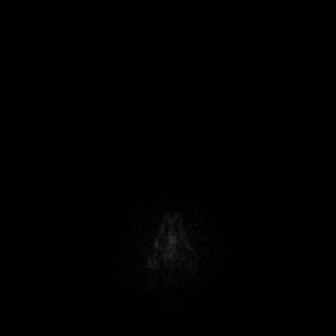
[im 10/40]
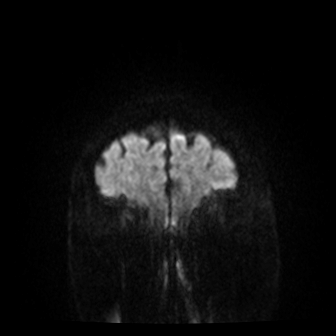
[im 20/40]
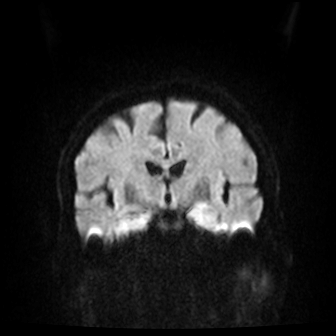
[im 30/40]
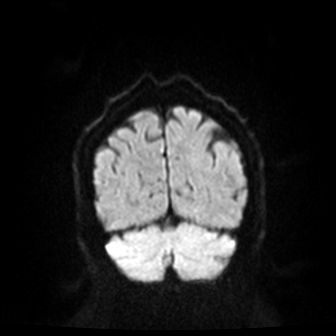
[im 40/40]
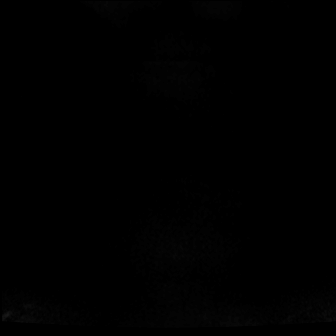

[Series 8: cor dwi_adc · coronal · 5.0mm · 0.68mm/px · 1 of 39 slices shown]
[im 1/39]
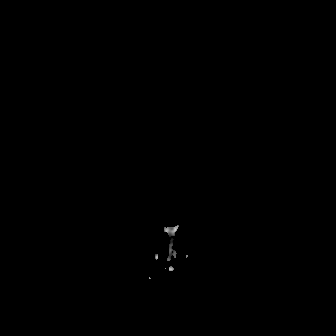

[Series 9: T1 · sagittal · 5.0mm · 0.94mm/px · 3 of 25 slices shown (1 of 2)]
[im 1/25]
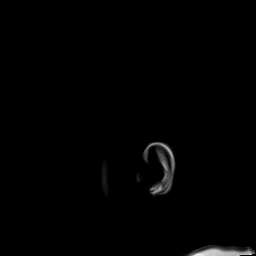
[im 13/25]
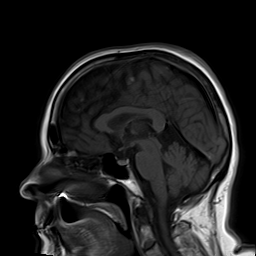
[im 25/25]
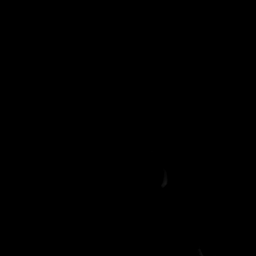

[Series 10: T2 · axial · 5.0mm · 0.45mm/px · z∈[-112,+43]mm · 4 of 27 slices shown (1 of 2)]
[im 1/27]
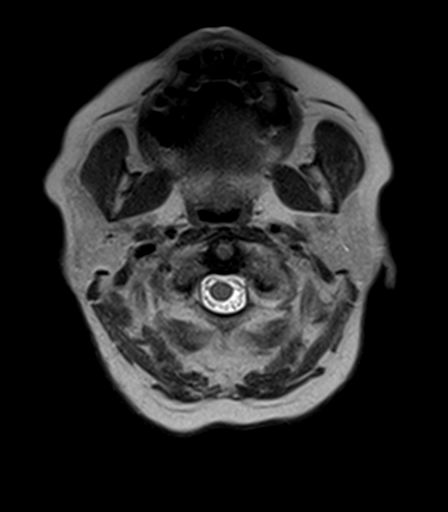
[im 9/27]
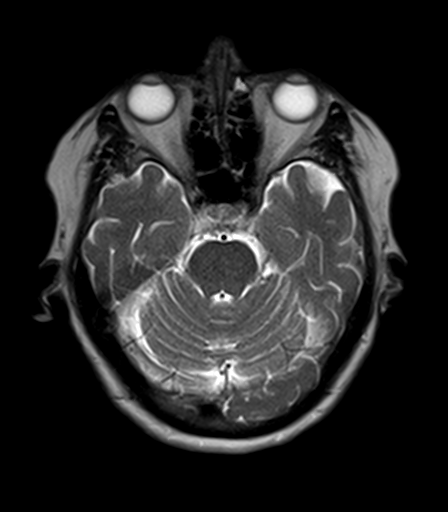
[im 18/27]
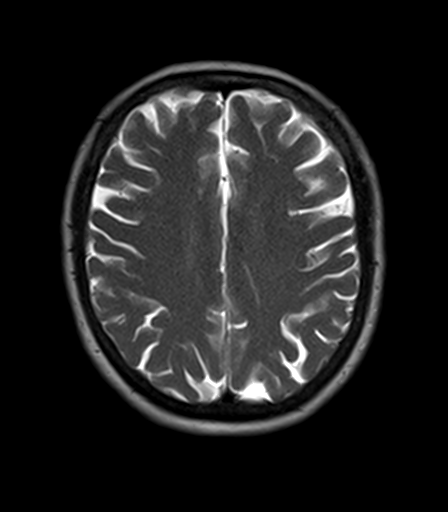
[im 27/27]
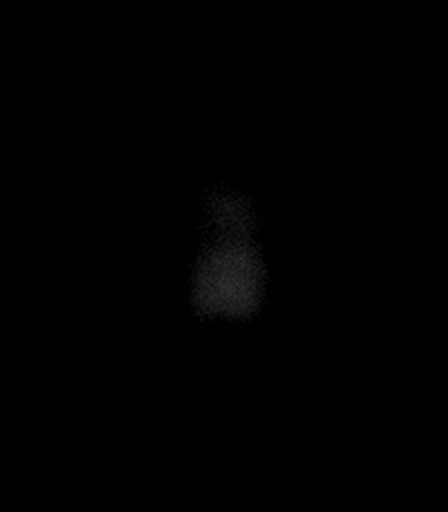

[Series 12: FLAIR · axial · 5.0mm · 1.20mm/px · z∈[-113,+42]mm · 4 of 27 slices shown]
[im 1/27]
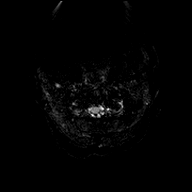
[im 9/27]
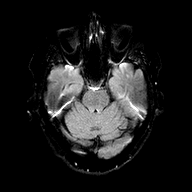
[im 18/27]
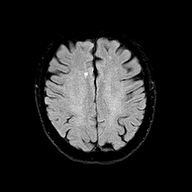
[im 27/27]
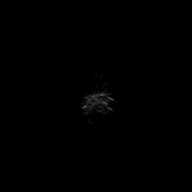

[Series 13: T1 · axial · 5.0mm · 0.90mm/px · z∈[-112,+43]mm · 4 of 27 slices shown (2 of 2)]
[im 1/27]
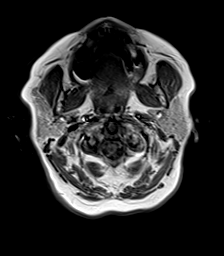
[im 9/27]
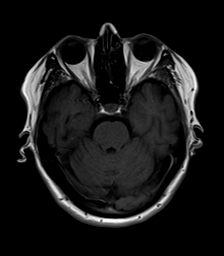
[im 18/27]
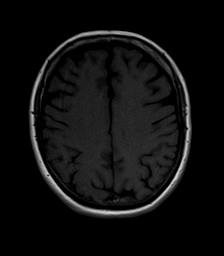
[im 27/27]
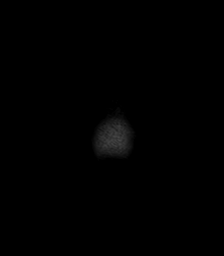

[Series 14: T2 · coronal · 5.0mm · 0.45mm/px · 4 of 31 slices shown (2 of 2)]
[im 1/31]
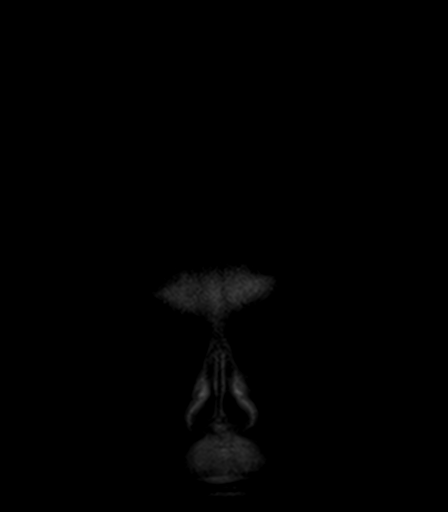
[im 11/31]
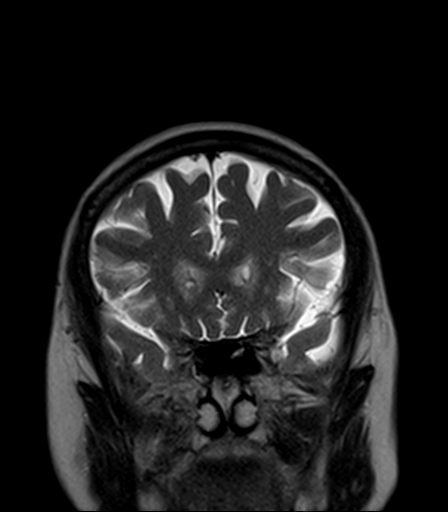
[im 21/31]
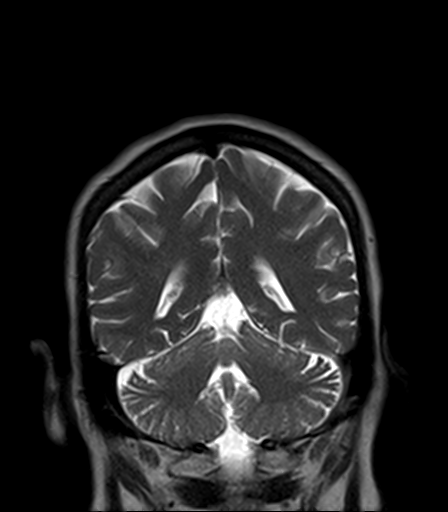
[im 31/31]
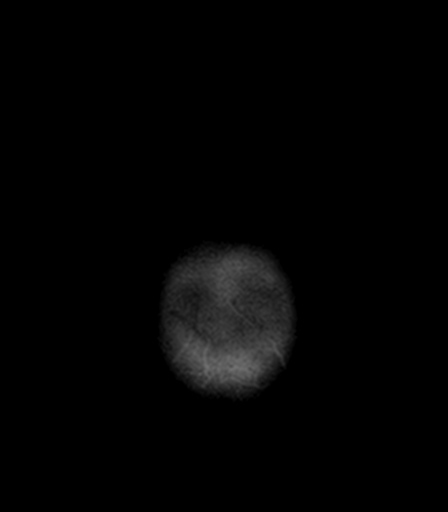

[40 of 48 positions shown; findings below may reference images not displayed]

FINDINGS: MRI HEAD FINDINGS

Brain:

There is no convincing evidence of acute infarct.

No evidence of intracranial mass.

No midline shift or extra-axial fluid collection.

No chronic intracranial blood products.

Mild scattered T2/FLAIR hyperintensity within the cerebral white
matter is nonspecific, but consistent with chronic small vessel
ischemic disease.

Cerebral volume is normal for age.

Vascular: Reported separately

Skull and upper cervical spine: No focal marrow lesion

Sinuses/Orbits: Visualized orbits demonstrate no acute abnormality.
Trace ethmoid sinus mucosal thickening. No significant mastoid
effusion.

MRA HEAD FINDINGS

The intracranial internal carotid arteries are patent with mild
atherosclerotic irregularity. The bilateral middle and anterior
cerebral arteries are patent without significant proximal stenosis.
No intracranial aneurysm is identified.

The intracranial vertebral arteries are patent. Apparent moderate
focal stenosis within the intracranial right vertebral artery. The
basilar artery is patent without significant stenosis. The right
posterior cerebral artery is patent without significant proximal
stenosis. A right posterior communicating artery is present.
Predominantly fetal origin of the left posterior cerebral artery,
which is patent without significant proximal stenosis.
IMPRESSION: MRI brain:

1. No evidence of acute intracranial abnormality.
2. Mild chronic small vessel ischemic disease.

MRA head:

1. No intracranial large vessel occlusion.
2. Moderate focal stenosis within the intracranial right vertebral
artery.
3. Mild atherosclerotic irregularity of the intracranial internal
carotid arteries.
4. No intracranial aneurysm identified.

## 2021-03-20 ENCOUNTER — Encounter: Payer: Medicare Other | Admitting: Internal Medicine

## 2021-03-30 DIAGNOSIS — M47816 Spondylosis without myelopathy or radiculopathy, lumbar region: Secondary | ICD-10-CM | POA: Diagnosis not present

## 2021-03-30 DIAGNOSIS — M5416 Radiculopathy, lumbar region: Secondary | ICD-10-CM | POA: Diagnosis not present

## 2021-03-30 DIAGNOSIS — M5136 Other intervertebral disc degeneration, lumbar region: Secondary | ICD-10-CM | POA: Diagnosis not present

## 2021-03-30 DIAGNOSIS — M48061 Spinal stenosis, lumbar region without neurogenic claudication: Secondary | ICD-10-CM | POA: Diagnosis not present

## 2021-04-08 ENCOUNTER — Encounter: Payer: Self-pay | Admitting: Internal Medicine

## 2021-04-09 NOTE — Telephone Encounter (Signed)
Pt wanted to speak with you about Mychart message. Please advise

## 2021-04-14 ENCOUNTER — Other Ambulatory Visit: Payer: Medicare Other

## 2021-04-14 ENCOUNTER — Telehealth (INDEPENDENT_AMBULATORY_CARE_PROVIDER_SITE_OTHER): Payer: Medicare Other | Admitting: Internal Medicine

## 2021-04-14 VITALS — Ht 65.0 in | Wt 223.0 lb

## 2021-04-14 DIAGNOSIS — J329 Chronic sinusitis, unspecified: Secondary | ICD-10-CM | POA: Diagnosis not present

## 2021-04-14 DIAGNOSIS — I5030 Unspecified diastolic (congestive) heart failure: Secondary | ICD-10-CM | POA: Diagnosis not present

## 2021-04-14 DIAGNOSIS — J029 Acute pharyngitis, unspecified: Secondary | ICD-10-CM

## 2021-04-14 DIAGNOSIS — I10 Essential (primary) hypertension: Secondary | ICD-10-CM | POA: Diagnosis not present

## 2021-04-14 DIAGNOSIS — R197 Diarrhea, unspecified: Secondary | ICD-10-CM | POA: Diagnosis not present

## 2021-04-14 NOTE — Progress Notes (Signed)
Patient ID: Nicole Sims, female   DOB: [birth year], 70 y.o.   MRN: [id number]   Virtual Visit via telephone Note  This visit type was conducted due to national recommendations for restrictions regarding the COVID-19 pandemic (e.g. social distancing).  This format is felt to be most appropriate for this patient at this time.  All issues noted in this document were discussed and addressed.  No physical exam was performed (except for noted visual exam findings with Video Visits).   I connected with Nicole Sims by telephone and verified that I am speaking with the correct person using two identifiers. Location patient: home Location provider: work Persons participating in the telephone visit: patient, provider  The limitations, risks, security and privacy concerns of performing an evaluation and management service by telephone and the availability of in person appointments have been discussed.  It has also been discussed with the patient that there may be a patient responsible charge related to this service. The patient expressed understanding and agreed to proceed.  Interactive audio and video telecommunications were attempted between this provider and patient, however failed.  We continued and completed visit with audio only.  Reason for visit: work in appt  HPI: Work in for diarrhea.  Had called in with concerns regarding persistent diarrhea.  Saw GI 01/2021 - has known IBS.  Discussed diarrhea then.  They recommended avoiding dairy and starting daily fiber.  Previously colonoscopy - negative random colon biopsies.  Stools would vary 2-3/day to 5-6/day.  She would try brat diet.  Reports no diarrhea since 04/10/21.  Had one formed bowel movement two days ago.  No vomiting.  Some nausea.  She is eating, but states may get busy and forget to eat.  Weight 229 pounds - increased from most recent check (down from 11/2020).  Also reports noticing headache - starting 04/11/21.  Eased the following day.   Reports increased sinus pressure and scratchy throat.  Increased drainage now.  Taking mucinex.  No chest congestion, wheezing or sob.  No acid reflux.  No known exposures to covid.    ROS: See pertinent positives and negatives per HPI.  Past Medical History:  Diagnosis Date   Anemia    Anxiety    Asthma    Cataract cortical, senile    CHF (congestive heart failure) (HCC)    Chronic headaches    Diastolic heart failure (HCC)    Diverticulosis    Environmental allergies    GERD (gastroesophageal reflux disease)    Heart murmur    Hyperglycemia    Hyperlipidemia    Hypertension    Leukocytosis    Obesity    Osteoarthritis    Palpitations    Restless leg syndrome    Sleep apnea    Thrombocytosis    Urinary incontinence    mixed   Venous insufficiency    Vitamin D deficiency    Wears dentures    partial upper    Past Surgical History:  Procedure Laterality Date   BLADDER SURGERY     x2   [facility]   BREAST CYST ASPIRATION Bilateral 2005   approximate year   CARDIAC CATHETERIZATION     [place]   CATARACT EXTRACTION W/PHACO Right 04/24/2020   Procedure: CATARACT EXTRACTION PHACO AND INTRAOCULAR LENS PLACEMENT ([place]) RIGHT;  Surgeon: Birder Robson, MD;  Location: [facility];  Service: Ophthalmology;  Laterality: Right;  6.75 0:37.3   CATARACT EXTRACTION W/PHACO Left 01/27/2021   Procedure: CATARACT EXTRACTION PHACO AND  INTRAOCULAR LENS PLACEMENT ([facility]) LEFT;  Surgeon: Birder Robson, MD;  Location: [facility];  Service: Ophthalmology;  Laterality: Left;  6.75 00:43.9   CERVICAL CONE BIOPSY     CIS   CHOLECYSTECTOMY     COLONOSCOPY WITH PROPOFOL N/A 07/19/2016   Procedure: COLONOSCOPY WITH PROPOFOL;  Surgeon: Manya Silvas, MD;  Location: [facility] ENDOSCOPY;  Service: Endoscopy;  Laterality: N/A;   COLONOSCOPY WITH PROPOFOL N/A 10/20/2018   Procedure: COLONOSCOPY WITH PROPOFOL;  Surgeon: Lollie Sails, MD;  Location: [facility] ENDOSCOPY;   Service: Endoscopy;  Laterality: N/A;   ESOPHAGOGASTRODUODENOSCOPY (EGD) WITH PROPOFOL N/A 07/19/2016   Procedure: ESOPHAGOGASTRODUODENOSCOPY (EGD) WITH PROPOFOL;  Surgeon: Manya Silvas, MD;  Location: [facility] ENDOSCOPY;  Service: Endoscopy;  Laterality: N/A;   FLEXIBLE SIGMOIDOSCOPY     KNEE ARTHROSCOPY  08/13/08   knee replacement and revision     left   RECTOCELE REPAIR     RIGHT/LEFT HEART CATH AND CORONARY ANGIOGRAPHY Bilateral 04/10/2018   Procedure: RIGHT/LEFT HEART CATH AND CORONARY ANGIOGRAPHY;  Surgeon: Wellington Hampshire, MD;  Location: [facility];  Service: Cardiovascular;  Laterality: Bilateral;   ROTATOR CUFF REPAIR     bilateral   SHOULDER SURGERY  11/17/05   TONSILLECTOMY  1962   VAGINAL HYSTERECTOMY  1974   abnormal pap and carcinoma in situ    Family History  Problem Relation Age of Onset   Diabetes Mellitus II Father    Thyroid disease Father    Alzheimer's disease Father    Breast cancer Maternal Aunt    Hypertension Mother    Heart Problems Brother    Leukemia Maternal Aunt    Alzheimer's disease Paternal Aunt    Alzheimer's disease Paternal Uncle    Alzheimer's disease Paternal Uncle    Alzheimer's disease Paternal Uncle    Alzheimer's disease Paternal Aunt    Colon cancer Neg Hx    Kidney cancer Neg Hx    Bladder Cancer Neg Hx     SOCIAL HX: reviewed.    Current Outpatient Medications:    albuterol (VENTOLIN HFA) 108 (90 Base) MCG/ACT inhaler, Inhale 2 puffs into the lungs every 6 (six) hours as needed for wheezing or shortness of breath., Disp: 18 g, Rfl: 1   aspirin EC 325 MG tablet, Take 325 mg by mouth daily. , Disp: , Rfl:    baclofen (LIORESAL) 10 MG tablet, Take 10 mg by mouth 3 (three) times daily as needed for muscle spasms., Disp: , Rfl:    budesonide-formoterol (SYMBICORT) 80-4.5 MCG/ACT inhaler, Inhale 2 puffs into the lungs 2 (two) times daily., Disp: 1 each, Rfl: 0   Cholecalciferol (VITAMIN D-3) 1000 units CAPS, Take 1,000 Units  by mouth daily., Disp: , Rfl:    clobetasol cream (TEMOVATE) [id number] %, Apply 1 application topically 2 (two) times daily., Disp: 30 g, Rfl: 1   fluticasone (FLONASE) 50 MCG/ACT nasal spray, Place 2 sprays into both nostrils daily., Disp: 48 g, Rfl: 3   furosemide (LASIX) 20 MG tablet, Take 1 tablet (20 mg) by mouth once daily as needed for weight gain of 3 lbs or more overnight, Disp: , Rfl:    linaclotide (LINZESS) 290 MCG CAPS capsule, Take 290 mcg by mouth daily before breakfast., Disp: , Rfl:    metoprolol succinate (TOPROL-XL) 100 MG 24 hr tablet, TAKE 1 TABLET BY MOUTH  DAILY WITH OR IMMEDIATLEY  FOLLOWING A MEAL, Disp: 90 tablet, Rfl: 3   montelukast (SINGULAIR) 10 MG tablet, TAKE 1 TABLET  BY MOUTH AT  BEDTIME, Disp: 90 tablet, Rfl: 1   ondansetron (ZOFRAN) 4 MG tablet, Take 1 tablet (4 mg total) by mouth 2 (two) times daily as needed for nausea or vomiting., Disp: 15 tablet, Rfl: 0   Peppermint Oil (IBGARD) 90 MG CPCR, Take by mouth., Disp: , Rfl:    potassium chloride (KLOR-CON) 10 MEQ tablet, Take 1 tablet (10 meq) by mouth once daily as needed only when taking lasix (furosemide), Disp: , Rfl:    predniSONE (DELTASONE) 10 MG tablet, Take 4 tablets x 1 day and then decrease by 1/2 tablet per day until down to zero mg., Disp: 18 tablet, Rfl: 0   pregabalin (LYRICA) 100 MG capsule, Take 100 mg by mouth at bedtime., Disp: , Rfl:    pregabalin (LYRICA) 50 MG capsule, Take by mouth. Take 1 capsule by mouth in the morning and 2 capsules at night., Disp: , Rfl:    RABEprazole (ACIPHEX) 20 MG tablet, Take 20 mg by mouth 2 (two) times daily., Disp: , Rfl:    rosuvastatin (CRESTOR) 10 MG tablet, TAKE 1 TABLET BY MOUTH  DAILY, Disp: 90 tablet, Rfl: 1   sucralfate (CARAFATE) 1 g tablet, Take 1 g by mouth daily as needed (GI sympotms). , Disp: , Rfl:    triamcinolone cream (KENALOG) 0.1 %, Apply 1 application topically 2 (two) times daily., Disp: 30 g, Rfl: 0   venlafaxine XR (EFFEXOR-XR) 150 MG 24 hr  capsule, TAKE 1 CAPSULE BY MOUTH  DAILY WITH BREAKFAST, Disp: 90 capsule, Rfl: 3  EXAM:  VITALS per patient if applicable: [id number], weight 229 pounds  GENERAL: alert. Sounds to be in no acute distress.  Answering questions appropriately   PSYCH/NEURO: pleasant and cooperative, no obvious depression or anxiety, speech and thought processing grossly intact  ASSESSMENT AND PLAN:  Discussed the following assessment and plan:  Problem List Items Addressed This Visit     Diarrhea    Was having persistent diarrhea.  Previously saw GI.  Recommended dietary changes as outlined.  Diarrhea resolved.  Formed bowel movement two days ago.  Hold on checking for infectious etiology.  Discussed IBS.  Is better/resolved.  Follow.        Diastolic heart failure (HCC)    History of diastolic dysfunction.  EF 60-65%.  No sob reported.  Follow.       Essential hypertension    Continue toprol.  Follow blood pressure.  Follow metabolic panel.       Sinusitis    Increased sinus pressure, congestion and sore throat with drainage.  Saline nasal spray, flonasa nasal spray as directed.  mucinex as directed.  Discussed possible covid.  Agreeable to be tested.  Check PCR.  Discussed quarantine guidelines.  Follow.       Sore throat - Primary    Symptoms as outlined.  Treat as outlined above.  covid swab.  Discussed quarantine guidelines.  Call with update.       Relevant Orders   Novel Coronavirus, NAA ([facility]) (Completed)    Return if symptoms worsen or fail to improve, for keep scheduled.   I discussed the assessment and treatment plan with the patient. The patient was provided an opportunity to ask questions and all were answered. The patient agreed with the plan and demonstrated an understanding of the instructions.   The patient was advised to call back or seek an in-person evaluation if the symptoms worsen or if the condition fails to improve as anticipated.  I provided  30 minutes of  non-face-to-face time during this encounter.   Einar Pheasant, MD

## 2021-04-15 LAB — SARS-COV-2, NAA 2 DAY TAT

## 2021-04-15 LAB — NOVEL CORONAVIRUS, NAA: SARS-CoV-2, NAA: NOT DETECTED

## 2021-04-16 ENCOUNTER — Encounter: Payer: Self-pay | Admitting: Internal Medicine

## 2021-04-16 MED ORDER — PREDNISONE 10 MG PO TABS: ORAL_TABLET | ORAL | 0 refills | Status: DC

## 2021-04-16 NOTE — Telephone Encounter (Signed)
Spoke with patient to advise of below. She is going to take the prednisone taper. While on the phone patient noted that she has been having urinary symptoms (listed below) for a few weeks. Advised that this was not discussed at her last visit and Dr Nicki Reaper is out of the office this afternoon, tomorrow and office closed Monday. Advised that no appts are available next week and she will need to be seen over the weekend for the possible UTI. Patient says if sx get bad enough she will.

## 2021-04-16 NOTE — Telephone Encounter (Signed)
I sent in prednisone taper.  If persistent problems, will need to be reevaluated.  See other message regarding urine.

## 2021-04-17 NOTE — Telephone Encounter (Signed)
Patient is aware of below. 

## 2021-04-17 NOTE — Telephone Encounter (Signed)
Previous urine did not reveal changes c/w UTI.  Agree with need for evaluation if new symptoms and concern regarding UTI.

## 2021-04-19 ENCOUNTER — Encounter: Payer: Self-pay | Admitting: Internal Medicine

## 2021-04-19 NOTE — Assessment & Plan Note (Signed)
Was having persistent diarrhea.  Previously saw GI.  Recommended dietary changes as outlined.  Diarrhea resolved.  Formed bowel movement two days ago.  Hold on checking for infectious etiology.  Discussed IBS.  Is better/resolved.  Follow.

## 2021-04-19 NOTE — Assessment & Plan Note (Signed)
Symptoms as outlined.  Treat as outlined above.  covid swab.  Discussed quarantine guidelines.  Call with update.

## 2021-04-19 NOTE — Assessment & Plan Note (Signed)
Increased sinus pressure, congestion and sore throat with drainage.  Saline nasal spray, flonasa nasal spray as directed.  mucinex as directed.  Discussed possible covid.  Agreeable to be tested.  Check PCR.  Discussed quarantine guidelines.  Follow.

## 2021-04-19 NOTE — Assessment & Plan Note (Signed)
History of diastolic dysfunction.  EF 60-65%.  No sob reported.  Follow.

## 2021-04-19 NOTE — Assessment & Plan Note (Signed)
Continue toprol.  Follow blood pressure.  Follow metabolic panel.

## 2021-05-11 ENCOUNTER — Encounter: Payer: Self-pay | Admitting: *Deleted

## 2021-05-11 NOTE — Anesthesia Preprocedure Evaluation (Addendum)
Anesthesia Evaluation  Patient identified by MRN, date of birth, ID band Patient awake    Reviewed: Allergy & Precautions, H&P , NPO status , Patient's Chart, lab work & pertinent test results, reviewed documented beta blocker date and time   Airway Mallampati: II  TM Distance: >3 FB Neck ROM: full    Dental  (+) Partial Upper   Pulmonary asthma , sleep apnea , former smoker,    Pulmonary exam normal breath sounds clear to auscultation       Cardiovascular Exercise Tolerance: Good METS: 5 - 7 Mets hypertension, Pt. on home beta blockers and Pt. on medications (-) angina+CHF (diastolic dysfunction.  EF 60-65%)   Rhythm:regular Rate:Normal     Neuro/Psych  Headaches, Anxiety Restless leg syndrome    GI/Hepatic Neg liver ROS, Dysphagia   Endo/Other  negative endocrine ROS  Renal/GU Renal InsufficiencyRenal disease  negative genitourinary   Musculoskeletal  (+) Arthritis , Osteoarthritis,    Abdominal (+) + obese,   Peds  Hematology negative hematology ROS (+)   Anesthesia Other Findings   Reproductive/Obstetrics negative OB ROS                           Anesthesia Physical  Anesthesia Plan  ASA: 3  Anesthesia Plan: General   Post-op Pain Management:    Induction: Intravenous  PONV Risk Score and Plan: 2 and Treatment may vary due to age or medical condition  Airway Management Planned: Natural Airway and Nasal Cannula  Additional Equipment:   Intra-op Plan:   Post-operative Plan:   Informed Consent: I have reviewed the patients History and Physical, chart, labs and discussed the procedure including the risks, benefits and alternatives for the proposed anesthesia with the patient or authorized representative who has indicated his/her understanding and acceptance.     Dental advisory given  Plan Discussed with: CRNA and Anesthesiologist  Anesthesia Plan Comments:        Anesthesia Quick Evaluation

## 2021-05-12 ENCOUNTER — Ambulatory Visit
Admission: RE | Admit: 2021-05-12 | Discharge: 2021-05-12 | Disposition: A | Discharge status: Home / Self Care | Payer: Medicare Other | Source: Ambulatory Visit | Attending: Gastroenterology | Admitting: Gastroenterology

## 2021-05-12 ENCOUNTER — Ambulatory Visit: Payer: Medicare Other | Admitting: Anesthesiology

## 2021-05-12 ENCOUNTER — Other Ambulatory Visit: Payer: Self-pay

## 2021-05-12 ENCOUNTER — Encounter: Payer: Self-pay | Admitting: *Deleted

## 2021-05-12 ENCOUNTER — Encounter
Admission: RE | Disposition: A | Discharge status: Home / Self Care | Payer: Self-pay | Source: Ambulatory Visit | Attending: Gastroenterology

## 2021-05-12 DIAGNOSIS — Z87891 Personal history of nicotine dependence: Secondary | ICD-10-CM | POA: Insufficient documentation

## 2021-05-12 DIAGNOSIS — G4733 Obstructive sleep apnea (adult) (pediatric): Secondary | ICD-10-CM | POA: Diagnosis not present

## 2021-05-12 DIAGNOSIS — F419 Anxiety disorder, unspecified: Secondary | ICD-10-CM | POA: Insufficient documentation

## 2021-05-12 DIAGNOSIS — K2100 Gastro-esophageal reflux disease with esophagitis, without bleeding: Secondary | ICD-10-CM | POA: Diagnosis not present

## 2021-05-12 DIAGNOSIS — K2950 Unspecified chronic gastritis without bleeding: Secondary | ICD-10-CM | POA: Insufficient documentation

## 2021-05-12 DIAGNOSIS — R1310 Dysphagia, unspecified: Secondary | ICD-10-CM | POA: Diagnosis not present

## 2021-05-12 DIAGNOSIS — B3781 Candidal esophagitis: Secondary | ICD-10-CM | POA: Diagnosis not present

## 2021-05-12 DIAGNOSIS — Z881 Allergy status to other antibiotic agents status: Secondary | ICD-10-CM | POA: Insufficient documentation

## 2021-05-12 DIAGNOSIS — Z880 Allergy status to penicillin: Secondary | ICD-10-CM | POA: Diagnosis not present

## 2021-05-12 DIAGNOSIS — K449 Diaphragmatic hernia without obstruction or gangrene: Secondary | ICD-10-CM | POA: Insufficient documentation

## 2021-05-12 DIAGNOSIS — Z79899 Other long term (current) drug therapy: Secondary | ICD-10-CM | POA: Insufficient documentation

## 2021-05-12 DIAGNOSIS — Z885 Allergy status to narcotic agent status: Secondary | ICD-10-CM | POA: Insufficient documentation

## 2021-05-12 DIAGNOSIS — K2970 Gastritis, unspecified, without bleeding: Secondary | ICD-10-CM | POA: Diagnosis not present

## 2021-05-12 DIAGNOSIS — Z7982 Long term (current) use of aspirin: Secondary | ICD-10-CM | POA: Insufficient documentation

## 2021-05-12 DIAGNOSIS — Z7951 Long term (current) use of inhaled steroids: Secondary | ICD-10-CM | POA: Insufficient documentation

## 2021-05-12 HISTORY — DX: Angina pectoris, unspecified: I20.9

## 2021-05-12 HISTORY — PX: ESOPHAGOGASTRODUODENOSCOPY: SHX5428

## 2021-05-12 LAB — KOH PREP: KOH Prep: NONE SEEN

## 2021-05-12 SURGERY — EGD (ESOPHAGOGASTRODUODENOSCOPY)
Anesthesia: General

## 2021-05-12 MED ORDER — PROPOFOL 10 MG/ML IV BOLUS
INTRAVENOUS | Status: DC | PRN
Start: 2021-05-12 — End: 2021-05-12
  Administered 2021-05-12: 20 mg via INTRAVENOUS
  Administered 2021-05-12 (×2): 30 mg via INTRAVENOUS
  Administered 2021-05-12: 80 mg via INTRAVENOUS

## 2021-05-12 MED ORDER — LIDOCAINE HCL (CARDIAC) PF 100 MG/5ML IV SOSY
PREFILLED_SYRINGE | INTRAVENOUS | Status: DC | PRN
  Administered 2021-05-12: 100 mg via INTRAVENOUS

## 2021-05-12 MED ORDER — PROPOFOL 500 MG/50ML IV EMUL
INTRAVENOUS | Status: AC
  Filled 2021-05-12: qty 50

## 2021-05-12 MED ORDER — SODIUM CHLORIDE 0.9 % IV SOLN: INTRAVENOUS | Status: DC

## 2021-05-12 MED ORDER — LIDOCAINE HCL (PF) 2 % IJ SOLN
INTRAMUSCULAR | Status: AC
  Filled 2021-05-12: qty 5

## 2021-05-12 NOTE — Transfer of Care (Signed)
Immediate Anesthesia Transfer of Care Note  Patient: Nicole Sims  Procedure(s) Performed: ESOPHAGOGASTRODUODENOSCOPY (EGD)  Patient Location: PACU  Anesthesia Type:MAC  Level of Consciousness: drowsy  Airway & Oxygen Therapy: Patient Spontanous Breathing  Post-op Assessment: Report given to RN and Post -op Vital signs reviewed and stable  Post vital signs: stable  Last Vitals:  Vitals Value Taken Time  BP 120/63 05/12/21 0842  Temp    Pulse 66 05/12/21 0843  Resp 9 05/12/21 0843  SpO2 98 % 05/12/21 0843  Vitals shown include unvalidated device data.  Last Pain:  Vitals:   05/12/21 0743  TempSrc: Temporal  PainSc: 0-No pain         Complications: No notable events documented.

## 2021-05-12 NOTE — Op Note (Signed)
[facility] Patient Name: Nicole Sims Procedure Date: 05/12/2021 8:23 AM MRN: [id number] Account #: [id number] Date of Birth: [birth year] Admit Type: Outpatient Age: 70 Room: [facility] ENDO ROOM 1 Gender: Female Note Status: Finalized Instrument Name: Upper Endoscope [id number] Procedure:             Upper GI endoscopy Indications:           Dysphagia, Gastro-esophageal reflux disease Providers:             Andrey Farmer MD, MD Referring MD:          Einar Pheasant, MD (Referring MD) Medicines:             Monitored Anesthesia Care Complications:         No immediate complications. Estimated blood loss:                         Minimal. Procedure:             Pre-Anesthesia Assessment:                        - Prior to the procedure, a History and Physical was                         performed, and patient medications and allergies were                         reviewed. The patient is competent. The risks and                         benefits of the procedure and the sedation options and                         risks were discussed with the patient. All questions                         were answered and informed consent was obtained.                         Patient identification and proposed procedure were                         verified by the physician, the nurse, the anesthetist                         and the technician in the endoscopy suite. Mental                         Status Examination: alert and oriented. Airway                         Examination: normal oropharyngeal airway and neck                         mobility. Respiratory Examination: clear to                         auscultation. CV Examination: normal. Prophylactic  Antibiotics: The patient does not require prophylactic                         antibiotics. Prior Anticoagulants: The patient has                         taken no previous anticoagulant or  antiplatelet                         agents. ASA Grade Assessment: II - A patient with mild                         systemic disease. After reviewing the risks and                         benefits, the patient was deemed in satisfactory                         condition to undergo the procedure. The anesthesia                         plan was to use monitored anesthesia care (MAC).                         Immediately prior to administration of medications,                         the patient was re-assessed for adequacy to receive                         sedatives. The heart rate, respiratory rate, oxygen                         saturations, blood pressure, adequacy of pulmonary                         ventilation, and response to care were monitored                         throughout the procedure. The physical status of the                         patient was re-assessed after the procedure.                        After obtaining informed consent, the endoscope was                         passed under direct vision. Throughout the procedure,                         the patient's blood pressure, pulse, and oxygen                         saturations were monitored continuously. The Endoscope                         was introduced through the mouth, and advanced to the  second part of duodenum. The upper GI endoscopy was                         accomplished without difficulty. The patient tolerated                         the procedure well. Findings:      White nummular lesions were noted in the entire esophagus. Brushings for       KOH prep were obtained in the entire esophagus. Biopsies were obtained       from the proximal and distal esophagus with cold forceps for histology       of suspected eosinophilic esophagitis. Estimated blood loss was minimal.      A small hiatal hernia was present.      The exam of the esophagus was otherwise normal.      Patchy mild  inflammation characterized by erythema was found in the       gastric antrum. Biopsies were taken with a cold forceps for Helicobacter       pylori testing. Estimated blood loss was minimal.      The examined duodenum was normal. Impression:            - White nummular lesions in esophageal mucosa.                         Brushings performed. Biopsied.                        - Small hiatal hernia.                        - Gastritis. Biopsied.                        - Normal examined duodenum. Recommendation:        - Discharge patient to home.                        - Resume previous diet.                        - Continue present medications.                        - Await pathology results.                        - Return to referring physician as previously                         scheduled. Procedure Code(s):     --- Professional ---                        [phone], Esophagogastroduodenoscopy, flexible,                         transoral; with biopsy, single or multiple Diagnosis Code(s):     --- Professional ---                        K22.8, Other specified diseases of esophagus  K44.9, Diaphragmatic hernia without obstruction or                         gangrene                        K29.70, Gastritis, unspecified, without bleeding                        R13.10, Dysphagia, unspecified                        K21.9, Gastro-esophageal reflux disease without                         esophagitis CPT copyright 2019 [employer]. All rights reserved. The codes documented in this report are preliminary and upon coder review may  be revised to meet current compliance requirements. Andrey Farmer MD, MD 05/12/2021 8:41:38 AM Number of Addenda: 0 Note Initiated On: 05/12/2021 8:23 AM Estimated Blood Loss:  Estimated blood loss was minimal.      [facility]

## 2021-05-12 NOTE — H&P (Signed)
Outpatient short stay form Pre-procedure 05/12/2021  Nicole Rubenstein, MD  Primary Physician: Einar Pheasant, MD  Reason for visit:  Dysphagia/GERD  History of present illness:   70 y/o lady with history of GERD and dysphagia to solids here for EGD. Barium swallow was normal. No trouble with liquids. Also with severe throat irritation. No blood thinners.    Current Facility-Administered Medications:    0.9 %  sodium chloride infusion, , Intravenous, Continuous, Sanuel Ladnier, Hilton Cork, MD, Last Rate: 20 mL/hr at 05/12/21 0819, Continued from Pre-op at 05/12/21 0819  Medications Prior to Admission  Medication Sig Dispense Refill Last Dose   acetaminophen (TYLENOL) 500 MG tablet Take 500 mg by mouth every 6 (six) hours as needed.   Past Week   albuterol (VENTOLIN HFA) 108 (90 Base) MCG/ACT inhaler Inhale 2 puffs into the lungs every 6 (six) hours as needed for wheezing or shortness of breath. 18 g 1 Past Week   aspirin EC 325 MG tablet Take 325 mg by mouth daily.    05/11/2021 at 0800   baclofen (LIORESAL) 10 MG tablet Take 10 mg by mouth 3 (three) times daily as needed for muscle spasms.   05/11/2021 at 0800   budesonide-formoterol (SYMBICORT) 80-4.5 MCG/ACT inhaler Inhale 2 puffs into the lungs 2 (two) times daily. 1 each 0 05/12/2021 at 0630   clobetasol cream (TEMOVATE) 1.61 % Apply 1 application topically 2 (two) times daily. 30 g 1 05/11/2021 at 0800   fluticasone (FLONASE) 50 MCG/ACT nasal spray Place 2 sprays into both nostrils daily. 48 g 3 05/11/2021 at 0800   furosemide (LASIX) 20 MG tablet Take 1 tablet (20 mg) by mouth once daily as needed for weight gain of 3 lbs or more overnight   05/11/2021 at 0800   linaclotide (LINZESS) 290 MCG CAPS capsule Take 290 mcg by mouth daily before breakfast.   05/11/2021 at 0800   melatonin 3 MG TABS tablet Take 3 mg by mouth at bedtime.   05/11/2021 at 0800   metoprolol succinate (TOPROL-XL) 100 MG 24 hr tablet TAKE 1 TABLET BY MOUTH  DAILY WITH OR  IMMEDIATLEY  FOLLOWING A MEAL 90 tablet 3 05/12/2021 at 0630   montelukast (SINGULAIR) 10 MG tablet TAKE 1 TABLET BY MOUTH AT  BEDTIME 90 tablet 1 05/11/2021 at 0800   Omega-3 Fatty Acids (FISH OIL) 1000 MG CAPS Take 1,000 mg by mouth.      ondansetron (ZOFRAN) 4 MG tablet Take 1 tablet (4 mg total) by mouth 2 (two) times daily as needed for nausea or vomiting. 15 tablet 0 05/11/2021 at 800   Peppermint Oil (IBGARD) 90 MG CPCR Take by mouth.   05/11/2021 at 0800   potassium chloride (KLOR-CON) 10 MEQ tablet Take 1 tablet (10 meq) by mouth once daily as needed only when taking lasix (furosemide)   05/11/2021 at 0800   pregabalin (LYRICA) 50 MG capsule Take by mouth. Take 1 capsule by mouth in the morning and 2 capsules at night.   05/11/2021 at 800   Probiotic Product (PROBIOTIC-10 PO) Take 100 mcg by mouth.      RABEprazole (ACIPHEX) 20 MG tablet Take 20 mg by mouth 2 (two) times daily.   05/11/2021 at 0800   rosuvastatin (CRESTOR) 10 MG tablet TAKE 1 TABLET BY MOUTH  DAILY 90 tablet 1 05/11/2021 at 0800   sucralfate (CARAFATE) 1 g tablet Take 1 g by mouth daily as needed (GI sympotms).    05/11/2021 at 800   triamcinolone cream (  KENALOG) 0.1 % Apply 1 application topically 2 (two) times daily. 30 g 0 05/11/2021 at 0800   venlafaxine XR (EFFEXOR-XR) 150 MG 24 hr capsule TAKE 1 CAPSULE BY MOUTH  DAILY WITH BREAKFAST 90 capsule 3 05/11/2021 at 800   Cholecalciferol (VITAMIN D-3) 1000 units CAPS Take 1,000 Units by mouth daily.      naproxen sodium (ALEVE) 220 MG tablet Take 220 mg by mouth 2 (two) times daily as needed.      predniSONE (DELTASONE) 10 MG tablet Take 4 tablets x 1 day and then decrease by 1/2 tablet per day until down to zero mg. 18 tablet 0    pregabalin (LYRICA) 100 MG capsule Take 100 mg by mouth at bedtime.        Allergies  Allergen Reactions   Levaquin [Levofloxacin]    Augmentin [Amoxicillin-Pot Clavulanate] Other (See Comments)    Questionable itching   Bactrim  [Sulfamethoxazole-Trimethoprim] Rash   Doxycycline Itching   Hydrocodone-Acetaminophen Other (See Comments)    GI distress   Pseudoephedrine Rash    Itching of the scalp   Shellfish Allergy Nausea And Vomiting    Nausea and vomiting      Past Medical History:  Diagnosis Date   Anemia    Anginal pain (HCC)    Anxiety    Asthma    Cataract cortical, senile    CHF (congestive heart failure) (HCC)    Chronic headaches    Diastolic heart failure (HCC)    Diverticulosis    Environmental allergies    GERD (gastroesophageal reflux disease)    Heart murmur    Hyperglycemia    Hyperlipidemia    Hypertension    Leukocytosis    Obesity    Osteoarthritis    Palpitations    Restless leg syndrome    Sleep apnea    Thrombocytosis    Urinary incontinence    mixed   Venous insufficiency    Vitamin D deficiency    Wears dentures    partial upper    Review of systems:  Otherwise negative.    Physical Exam  Gen: Alert, oriented. Appears stated age.  HEENT: PERRLA. Lungs: No respiratory distress CV: RRR Abd: soft, benign, no masses Ext: No edema    Planned procedures: Proceed with EGD. The patient understands the nature of the planned procedure, indications, risks, alternatives and potential complications including but not limited to bleeding, infection, perforation, damage to internal organs and possible oversedation/side effects from anesthesia. The patient agrees and gives consent to proceed.  Please refer to procedure notes for findings, recommendations and patient disposition/instructions.     Nicole Rubenstein, MD [facility]

## 2021-05-12 NOTE — Anesthesia Postprocedure Evaluation (Signed)
Anesthesia Post Note  Patient: Nicole Sims  Procedure(s) Performed: ESOPHAGOGASTRODUODENOSCOPY (EGD)  Patient location during evaluation: Endoscopy Anesthesia Type: General Level of consciousness: awake and alert Pain management: pain level controlled Vital Signs Assessment: post-procedure vital signs reviewed and stable Respiratory status: spontaneous breathing, nonlabored ventilation and respiratory function stable Cardiovascular status: blood pressure returned to baseline and stable Postop Assessment: no apparent nausea or vomiting Anesthetic complications: no   No notable events documented.   Last Vitals:  Vitals:   05/12/21 0903 05/12/21 0913  BP: 136/68 (!) 142/72  Pulse: 60 64  Resp: 17 17  Temp:    SpO2: 99% 100%    Last Pain:  Vitals:   05/12/21 0913  TempSrc:   PainSc: 0-No pain                 [place] Ouch

## 2021-05-12 NOTE — Interval H&P Note (Signed)
History and Physical Interval Note:  05/12/2021 8:21 AM  Nicole Sims  has presented today for surgery, with the diagnosis of DYSPHAGIA.  The various methods of treatment have been discussed with the patient and family. After consideration of risks, benefits and other options for treatment, the patient has consented to  Procedure(s): ESOPHAGOGASTRODUODENOSCOPY (EGD) (N/A) as a surgical intervention.  The patient's history has been reviewed, patient examined, no change in status, stable for surgery.  I have reviewed the patient's chart and labs.  Questions were answered to the patient's satisfaction.     Lesly Rubenstein  Ok to proceed with EGD

## 2021-05-13 ENCOUNTER — Encounter: Payer: Self-pay | Admitting: Gastroenterology

## 2021-05-13 LAB — SURGICAL PATHOLOGY

## 2021-05-17 ENCOUNTER — Other Ambulatory Visit: Payer: Self-pay | Admitting: Internal Medicine

## 2021-05-18 ENCOUNTER — Other Ambulatory Visit: Payer: Self-pay

## 2021-05-18 ENCOUNTER — Ambulatory Visit (INDEPENDENT_AMBULATORY_CARE_PROVIDER_SITE_OTHER): Payer: Medicare Other | Admitting: Internal Medicine

## 2021-05-18 ENCOUNTER — Encounter: Payer: Self-pay | Admitting: Internal Medicine

## 2021-05-18 VITALS — BP 122/84 | HR 64 | Temp 96.0°F | Ht 65.0 in | Wt 234.2 lb

## 2021-05-18 DIAGNOSIS — M7660 Achilles tendinitis, unspecified leg: Secondary | ICD-10-CM

## 2021-05-18 DIAGNOSIS — I700 Atherosclerosis of aorta: Secondary | ICD-10-CM | POA: Diagnosis not present

## 2021-05-18 DIAGNOSIS — D473 Essential (hemorrhagic) thrombocythemia: Secondary | ICD-10-CM | POA: Diagnosis not present

## 2021-05-18 DIAGNOSIS — Z0000 Encounter for general adult medical examination without abnormal findings: Secondary | ICD-10-CM | POA: Diagnosis not present

## 2021-05-18 DIAGNOSIS — Z23 Encounter for immunization: Secondary | ICD-10-CM | POA: Diagnosis not present

## 2021-05-18 DIAGNOSIS — N1831 Chronic kidney disease, stage 3a: Secondary | ICD-10-CM

## 2021-05-18 DIAGNOSIS — R739 Hyperglycemia, unspecified: Secondary | ICD-10-CM | POA: Diagnosis not present

## 2021-05-18 DIAGNOSIS — I10 Essential (primary) hypertension: Secondary | ICD-10-CM | POA: Diagnosis not present

## 2021-05-18 DIAGNOSIS — E7800 Pure hypercholesterolemia, unspecified: Secondary | ICD-10-CM | POA: Diagnosis not present

## 2021-05-18 DIAGNOSIS — I5030 Unspecified diastolic (congestive) heart failure: Secondary | ICD-10-CM

## 2021-05-18 DIAGNOSIS — R1310 Dysphagia, unspecified: Secondary | ICD-10-CM

## 2021-05-18 DIAGNOSIS — L900 Lichen sclerosus et atrophicus: Secondary | ICD-10-CM

## 2021-05-18 DIAGNOSIS — K219 Gastro-esophageal reflux disease without esophagitis: Secondary | ICD-10-CM

## 2021-05-18 DIAGNOSIS — F439 Reaction to severe stress, unspecified: Secondary | ICD-10-CM

## 2021-05-18 LAB — LIPID PANEL
Cholesterol: 205 mg/dL — ABNORMAL HIGH (ref 0–200)
HDL: 58.6 mg/dL (ref >39.00)
LDL Cholesterol: 107 mg/dL — ABNORMAL HIGH (ref 0–99)
NonHDL: 146.21
Total CHOL/HDL Ratio: 3
Triglycerides: 198 mg/dL — ABNORMAL HIGH (ref 0.0–149.0)
VLDL: 39.6 mg/dL (ref 0.0–40.0)

## 2021-05-18 LAB — BASIC METABOLIC PANEL WITH GFR
BUN: 13 mg/dL (ref 6–23)
CO2: 27 meq/L (ref 19–32)
Calcium: 9.5 mg/dL (ref 8.4–10.5)
Chloride: 104 meq/L (ref 96–112)
Creatinine, Ser: 1.02 mg/dL (ref 0.40–1.20)
GFR: 55.52 mL/min — ABNORMAL LOW
Glucose, Bld: 95 mg/dL (ref 70–99)
Potassium: 4.1 meq/L (ref 3.5–5.1)
Sodium: 140 meq/L (ref 135–145)

## 2021-05-18 LAB — HEPATIC FUNCTION PANEL
ALT: 16 U/L (ref 0–35)
AST: 19 U/L (ref 0–37)
Albumin: 4.3 g/dL (ref 3.5–5.2)
Alkaline Phosphatase: 82 U/L (ref 39–117)
Bilirubin, Direct: 0.1 mg/dL (ref 0.0–0.3)
Total Bilirubin: 0.4 mg/dL (ref 0.2–1.2)
Total Protein: 7.8 g/dL (ref 6.0–8.3)

## 2021-05-18 LAB — HEMOGLOBIN A1C: Hgb A1c MFr Bld: 6 % (ref 4.6–6.5)

## 2021-05-18 NOTE — Progress Notes (Signed)
Patient ID: Nicole Sims, female   DOB: 1951-03-30, 70 y.o.   MRN: [id number]   Subjective:    Patient ID: Nicole Sims, female    DOB: [birth year], 70 y.o.   MRN: [id number]  This visit occurred during the SARS-CoV-2 public health emergency.  Safety protocols were in place, including screening questions prior to the visit, additional usage of staff PPE, and extensive cleaning of exam room while observing appropriate contact time as indicated for disinfecting solutions.   Patient here for her physical exam.   Chief Complaint  Patient presents with   Annual Exam   .   HPI Recent EGD.  Biopsy - fungal yeast and hyphae - compatible with candidal esophagitis.  Seeing GI for f/u dysphagia and gastritis.  No chest pain.  Breathing overall appears to be stable.  No abdominal pain.  Increased constipation.  Discussed adding fiber.  No blood.  Is having pain - heel/achilles. Persistent.  Discussed podiatry referral.     Past Medical History:  Diagnosis Date   Anemia    Anginal pain ([place])    Anxiety    Asthma    Cataract cortical, senile    CHF (congestive heart failure) (HCC)    Chronic headaches    Diastolic heart failure (HCC)    Diverticulosis    Environmental allergies    GERD (gastroesophageal reflux disease)    Heart murmur    Hyperglycemia    Hyperlipidemia    Hypertension    Leukocytosis    Obesity    Osteoarthritis    Palpitations    Restless leg syndrome    Sleep apnea    Thrombocytosis    Urinary incontinence    mixed   Venous insufficiency    Vitamin D deficiency    Wears dentures    partial upper   Past Surgical History:  Procedure Laterality Date   BLADDER SURGERY     x2   [place] [facility]   BREAST CYST ASPIRATION Bilateral 2005   approximate year   CARDIAC CATHETERIZATION     [place]   CATARACT EXTRACTION W/PHACO Right 04/24/2020   Procedure: CATARACT EXTRACTION PHACO AND INTRAOCULAR LENS PLACEMENT ([place]) RIGHT;  Surgeon: Birder Robson, MD;   Location: [facility];  Service: Ophthalmology;  Laterality: Right;  6.75 0:37.3   CATARACT EXTRACTION W/PHACO Left 01/27/2021   Procedure: CATARACT EXTRACTION PHACO AND INTRAOCULAR LENS PLACEMENT ([place]) LEFT;  Surgeon: Birder Robson, MD;  Location: [place];  Service: Ophthalmology;  Laterality: Left;  6.75 00:43.9   CERVICAL CONE BIOPSY     CIS   CHOLECYSTECTOMY     COLONOSCOPY WITH PROPOFOL N/A 07/19/2016   Procedure: COLONOSCOPY WITH PROPOFOL;  Surgeon: Manya Silvas, MD;  Location: [facility] ENDOSCOPY;  Service: Endoscopy;  Laterality: N/A;   COLONOSCOPY WITH PROPOFOL N/A 10/20/2018   Procedure: COLONOSCOPY WITH PROPOFOL;  Surgeon: Lollie Sails, MD;  Location: [facility] ENDOSCOPY;  Service: Endoscopy;  Laterality: N/A;   ESOPHAGOGASTRODUODENOSCOPY N/A 05/12/2021   Procedure: ESOPHAGOGASTRODUODENOSCOPY (EGD);  Surgeon: Lesly Rubenstein, MD;  Location: [facility] ENDOSCOPY;  Service: Endoscopy;  Laterality: N/A;   ESOPHAGOGASTRODUODENOSCOPY (EGD) WITH PROPOFOL N/A 07/19/2016   Procedure: ESOPHAGOGASTRODUODENOSCOPY (EGD) WITH PROPOFOL;  Surgeon: Manya Silvas, MD;  Location: [facility] ENDOSCOPY;  Service: Endoscopy;  Laterality: N/A;   FLEXIBLE SIGMOIDOSCOPY     KNEE ARTHROSCOPY  08/13/08   knee replacement and revision     left   RECTOCELE REPAIR     RIGHT/LEFT HEART CATH AND CORONARY ANGIOGRAPHY  Bilateral 04/10/2018   Procedure: RIGHT/LEFT HEART CATH AND CORONARY ANGIOGRAPHY;  Surgeon: Wellington Hampshire, MD;  Location: [place] [facility];  Service: Cardiovascular;  Laterality: Bilateral;   ROTATOR CUFF REPAIR     bilateral   SHOULDER SURGERY  11/17/05   TONSILLECTOMY  1962   VAGINAL HYSTERECTOMY  1974   abnormal pap and carcinoma in situ   Family History  Problem Relation Age of Onset   Diabetes Mellitus II Father    Thyroid disease Father    Alzheimer's disease Father    Breast cancer Maternal Aunt    Hypertension Mother    Heart Problems Brother    Leukemia  Maternal Aunt    Alzheimer's disease Paternal Aunt    Alzheimer's disease Paternal Uncle    Alzheimer's disease Paternal Uncle    Alzheimer's disease Paternal Uncle    Alzheimer's disease Paternal Aunt    Colon cancer Neg Hx    Kidney cancer Neg Hx    Bladder Cancer Neg Hx    Social History   Socioeconomic History   Marital status: Married    Spouse name: Not on file   Number of children: Not on file   Years of education: Not on file   Highest education level: Not on file  Occupational History   Occupation: retired  Tobacco Use   Smoking status: Former    Packs/day: 1.00    Years: 15.00    Pack years: 15.00    Types: Cigarettes    Quit date: 08/17/1983    Years since quitting: 37.7   Smokeless tobacco: Never  Vaping Use   Vaping Use: Never used  Substance and Sexual Activity   Alcohol use: Yes    Alcohol/week: 0.0 standard drinks    Comment: rarely   Drug use: No   Sexual activity: Not on file  Other Topics Concern   Not on file  Social History Narrative   Lives at home with husband   Social Determinants of Health   Financial Resource Strain: Not on file  Food Insecurity: Not on file  Transportation Needs: Not on file  Physical Activity: Not on file  Stress: Not on file  Social Connections: Not on file     Review of Systems  Constitutional:  Negative for appetite change and unexpected weight change.  HENT:  Negative for congestion and sinus pressure.   Eyes:  Negative for pain and visual disturbance.  Respiratory:  Negative for cough and chest tightness.        Breathing stable.   Cardiovascular:  Negative for chest pain and palpitations.  Gastrointestinal:  Positive for constipation. Negative for abdominal pain, diarrhea and vomiting.  Genitourinary:  Negative for difficulty urinating and dysuria.  Musculoskeletal:  Negative for joint swelling and myalgias.  Skin:  Negative for color change and rash.  Neurological:  Negative for dizziness,  light-headedness and headaches.  Hematological:  Negative for adenopathy. Does not bruise/bleed easily.  Psychiatric/Behavioral:  Negative for agitation and dysphoric mood.       Objective:     BP 122/84   Pulse 64   Temp (!) 96 F (35.6 C)   Ht $R'5\' 5"'ly$  (1.651 m)   Wt 234 lb 3.2 oz (106.2 kg)   SpO2 99%   BMI 38.97 kg/m  Wt Readings from Last 3 Encounters:  05/18/21 234 lb 3.2 oz (106.2 kg)  05/12/21 232 lb (105.2 kg)  04/14/21 223 lb (101.2 kg)    Physical Exam Vitals reviewed.  Constitutional:  General: She is not in acute distress.    Appearance: Normal appearance. She is well-developed.  HENT:     Head: Normocephalic and atraumatic.     Right Ear: External ear normal.     Left Ear: External ear normal.  Eyes:     General: No scleral icterus.       Right eye: No discharge.        Left eye: No discharge.     Conjunctiva/sclera: Conjunctivae normal.  Neck:     Thyroid: No thyromegaly.  Cardiovascular:     Rate and Rhythm: Normal rate and regular rhythm.  Pulmonary:     Effort: No tachypnea, accessory muscle usage or respiratory distress.     Breath sounds: Normal breath sounds. No decreased breath sounds or wheezing.  Chest:  Breasts:    Right: No inverted nipple, mass, nipple discharge or tenderness (no axillary adenopathy).     Left: No inverted nipple, mass, nipple discharge or tenderness (no axilarry adenopathy).  Abdominal:     General: Bowel sounds are normal.     Palpations: Abdomen is soft.     Tenderness: There is no abdominal tenderness.  Musculoskeletal:        General: No swelling or tenderness.     Cervical back: Neck supple.  Lymphadenopathy:     Cervical: No cervical adenopathy.  Skin:    Findings: No erythema or rash.  Neurological:     Mental Status: She is alert and oriented to person, place, and time.  Psychiatric:        Mood and Affect: Mood normal.        Behavior: Behavior normal.     Outpatient Encounter Medications as of  05/18/2021  Medication Sig   acetaminophen (TYLENOL) 500 MG tablet Take 500 mg by mouth every 6 (six) hours as needed.   albuterol (VENTOLIN HFA) 108 (90 Base) MCG/ACT inhaler Inhale 2 puffs into the lungs every 6 (six) hours as needed for wheezing or shortness of breath.   baclofen (LIORESAL) 10 MG tablet Take 10 mg by mouth 3 (three) times daily as needed for muscle spasms.   budesonide-formoterol (SYMBICORT) 80-4.5 MCG/ACT inhaler Inhale 2 puffs into the lungs 2 (two) times daily.   Cholecalciferol (VITAMIN D-3) 1000 units CAPS Take 1,000 Units by mouth daily.   clobetasol cream (TEMOVATE) 2.56 % Apply 1 application topically 2 (two) times daily.   fluticasone (FLONASE) 50 MCG/ACT nasal spray Place 2 sprays into both nostrils daily.   furosemide (LASIX) 20 MG tablet Take 1 tablet (20 mg) by mouth once daily as needed for weight gain of 3 lbs or more overnight   linaclotide (LINZESS) 290 MCG CAPS capsule Take 290 mcg by mouth daily before breakfast.   melatonin 3 MG TABS tablet Take 3 mg by mouth at bedtime.   metoprolol succinate (TOPROL-XL) 100 MG 24 hr tablet TAKE 1 TABLET BY MOUTH  DAILY WITH OR IMMEDIATLEY  FOLLOWING A MEAL   montelukast (SINGULAIR) 10 MG tablet TAKE 1 TABLET BY MOUTH AT  BEDTIME   Omega-3 Fatty Acids (FISH OIL) 1000 MG CAPS Take 1,000 mg by mouth.   ondansetron (ZOFRAN) 4 MG tablet Take 1 tablet (4 mg total) by mouth 2 (two) times daily as needed for nausea or vomiting.   Peppermint Oil (IBGARD) 90 MG CPCR Take by mouth.   potassium chloride (KLOR-CON) 10 MEQ tablet Take 1 tablet (10 meq) by mouth once daily as needed only when taking lasix (furosemide)   pregabalin (LYRICA)  100 MG capsule Take 100 mg by mouth at bedtime.   pregabalin (LYRICA) 50 MG capsule Take by mouth. Take 1 capsule by mouth in the morning and 2 capsules at night.   Probiotic Product (PROBIOTIC-10 PO) Take 100 mcg by mouth.   RABEprazole (ACIPHEX) 20 MG tablet Take 20 mg by mouth 2 (two) times daily.    sucralfate (CARAFATE) 1 g tablet Take 1 g by mouth daily as needed (GI sympotms).    triamcinolone cream (KENALOG) 0.1 % Apply 1 application topically 2 (two) times daily.   venlafaxine XR (EFFEXOR-XR) 150 MG 24 hr capsule TAKE 1 CAPSULE BY MOUTH  DAILY WITH BREAKFAST   [DISCONTINUED] rosuvastatin (CRESTOR) 10 MG tablet TAKE 1 TABLET BY MOUTH  DAILY   [DISCONTINUED] aspirin EC 325 MG tablet Take 325 mg by mouth daily.    [DISCONTINUED] naproxen sodium (ALEVE) 220 MG tablet Take 220 mg by mouth 2 (two) times daily as needed.   [DISCONTINUED] predniSONE (DELTASONE) 10 MG tablet Take 4 tablets x 1 day and then decrease by 1/2 tablet per day until down to zero mg.   No facility-administered encounter medications on file as of 05/18/2021.     Lab Results  Component Value Date   WBC 8.6 06/16/2020   HGB 12.2 06/16/2020   HCT 38.3 06/16/2020   PLT 430 (H) 06/16/2020   GLUCOSE 95 05/18/2021   CHOL 205 (H) 05/18/2021   TRIG 198.0 (H) 05/18/2021   HDL 58.60 05/18/2021   LDLDIRECT 172.8 09/17/2013   LDLCALC 107 (H) 05/18/2021   ALT 16 05/18/2021   AST 19 05/18/2021   NA 140 05/18/2021   K 4.1 05/18/2021   CL 104 05/18/2021   CREATININE 1.02 05/18/2021   BUN 13 05/18/2021   CO2 27 05/18/2021   TSH 2.06 12/10/2020   INR 1.0 06/11/2019   HGBA1C 6.0 05/18/2021   MICROALBUR 1.1 04/28/2015    MM 3D SCREEN BREAST BILATERAL  Result Date: 03/02/2021 CLINICAL DATA:  Screening. EXAM: DIGITAL SCREENING BILATERAL MAMMOGRAM WITH TOMOSYNTHESIS AND CAD TECHNIQUE: Bilateral screening digital craniocaudal and mediolateral oblique mammograms were obtained. Bilateral screening digital breast tomosynthesis was performed. The images were evaluated with computer-aided detection. COMPARISON:  Previous exam(s). ACR Breast Density Category c: The breast tissue is heterogeneously dense, which may obscure small masses. FINDINGS: There are no findings suspicious for malignancy. IMPRESSION: No mammographic evidence  of malignancy. A result letter of this screening mammogram will be mailed directly to the patient. RECOMMENDATION: Screening mammogram in one year. (Code:SM-B-01Y) BI-RADS CATEGORY  1: Negative. Electronically Signed   By: Claudie Revering M.D.   On: 03/02/2021 10:43      Assessment & Plan:   Problem List Items Addressed This Visit     Achilles tendon pain    Persistent heel pain/pain in achilles. Discussed supports.  Stretches.  Refer to podiatry for further evaluation and treatment.       Relevant Orders   Ambulatory referral to Podiatry   Aortic atherosclerosis ([place])    Continue crestor.       CKD (chronic kidney disease) stage 3, GFR 30-59 ml/min (HCC)    Stay hydrated.  Avoid antiinflammatories.  Follow metabolic panel.       Diastolic heart failure (HCC)    History of diastolic dysfunction.  EF 60-65%.  No increased sob reported.  Follow. Continue metoprolol.       Dysphagia    Recent GI evaluation.  S/p recent EGD.  Results as outlined.  Treat candida esophagitis.  Essential (hemorrhagic) thrombocythemia ([place])    Worked up previously by hematology.  Follow cbc.  Felt to be reactive.        Essential hypertension    Continue toprol.  Follow blood pressure.  Follow metabolic panel.       Relevant Orders   Basic metabolic panel (Completed)   Gastroesophageal reflux disease    Continue aciphex.  EGD recently as outlined.  Continue f/u with GI.        Health care maintenance    Physical today 05/18/21.  Colonoscopy 10/2018 - recommended f/u in 5 years.  PAP 02/15/20.  Followed by gyn.  Mammogram 03/02/21 - Birads I.       Hypercholesteremia    Continue crestor.  Low cholesterol diet and exercise.  Follow lipid panel and liver function tests.        Relevant Orders   Hepatic function panel (Completed)   Lipid panel (Completed)   Hyperglycemia    Low carb diet and exercise.  Follow met b and a1c.       Relevant Orders   Hemoglobin C3U (Completed)   Lichen  sclerosus    Had pap 02/2020 - ok with negative HPV.  Has been seeing gyn.        Stress    Continue effexor.  Overall appears to be stable.  Follow.       Other Visit Diagnoses     Routine general medical examination at a health care facility    -  Primary   Need for immunization against influenza       Relevant Orders   Flu Vaccine QUAD High Dose(Fluad) (Completed)        Einar Pheasant, MD

## 2021-05-19 ENCOUNTER — Other Ambulatory Visit: Payer: Self-pay | Admitting: Internal Medicine

## 2021-05-23 ENCOUNTER — Encounter: Payer: Self-pay | Admitting: Internal Medicine

## 2021-05-23 DIAGNOSIS — M7660 Achilles tendinitis, unspecified leg: Secondary | ICD-10-CM | POA: Insufficient documentation

## 2021-05-23 NOTE — Assessment & Plan Note (Signed)
Continue aciphex.  EGD recently as outlined.  Continue f/u with GI.

## 2021-05-23 NOTE — Assessment & Plan Note (Signed)
Had pap 02/2020 - ok with negative HPV.  Has been seeing gyn.

## 2021-05-23 NOTE — Assessment & Plan Note (Signed)
Low carb diet and exercise.  Follow met b and a1c.  

## 2021-05-23 NOTE — Assessment & Plan Note (Signed)
Physical today 05/18/21.  Colonoscopy 10/2018 - recommended f/u in 5 years.  PAP 02/15/20.  Followed by gyn.  Mammogram 03/02/21 - Birads I.

## 2021-05-23 NOTE — Assessment & Plan Note (Signed)
Continue effexor.  Overall appears to be stable.  Follow.

## 2021-05-23 NOTE — Assessment & Plan Note (Signed)
Recent GI evaluation.  S/p recent EGD.  Results as outlined.  Treat candida esophagitis.

## 2021-05-23 NOTE — Assessment & Plan Note (Signed)
Continue toprol.  Follow blood pressure.  Follow metabolic panel.

## 2021-05-23 NOTE — Assessment & Plan Note (Signed)
History of diastolic dysfunction.  EF 60-65%.  No increased sob reported.  Follow. Continue metoprolol.  

## 2021-05-23 NOTE — Assessment & Plan Note (Addendum)
Persistent heel pain/pain in achilles. Discussed supports.  Stretches.  Refer to podiatry for further evaluation and treatment.

## 2021-05-23 NOTE — Assessment & Plan Note (Signed)
Continue crestor.  Low cholesterol diet and exercise. Follow lipid panel and liver function tests.   

## 2021-05-23 NOTE — Assessment & Plan Note (Signed)
Continue crestor 

## 2021-05-23 NOTE — Assessment & Plan Note (Signed)
Worked up previously by hematology.  Follow cbc.  Felt to be reactive.

## 2021-05-23 NOTE — Assessment & Plan Note (Signed)
Stay hydrated.  Avoid antiinflammatories.  Follow metabolic panel.   

## 2021-05-25 ENCOUNTER — Encounter: Payer: Self-pay | Admitting: Oncology

## 2021-06-02 ENCOUNTER — Ambulatory Visit: Payer: Medicare Other | Admitting: Podiatry

## 2021-06-02 ENCOUNTER — Ambulatory Visit (INDEPENDENT_AMBULATORY_CARE_PROVIDER_SITE_OTHER): Payer: Medicare Other

## 2021-06-02 ENCOUNTER — Encounter: Payer: Self-pay | Admitting: Podiatry

## 2021-06-02 ENCOUNTER — Other Ambulatory Visit: Payer: Self-pay

## 2021-06-02 DIAGNOSIS — M19071 Primary osteoarthritis, right ankle and foot: Secondary | ICD-10-CM

## 2021-06-02 DIAGNOSIS — M7662 Achilles tendinitis, left leg: Secondary | ICD-10-CM

## 2021-06-02 DIAGNOSIS — M19079 Primary osteoarthritis, unspecified ankle and foot: Secondary | ICD-10-CM

## 2021-06-02 NOTE — Progress Notes (Signed)
Subjective:  Patient ID: Nicole Sims, female    DOB: 22-Aug-1950,  MRN: [id number]  Chief Complaint  Patient presents with   Foot Pain    Bilateral foot pain Knot on top of right foot  Left heel pain  Pain is in the back of the heel pt stated that it feels like a stabbing pain and throbbing  Some days are worse than the others     70 y.o. female presents with the above complaint.  Patient presents with 2 primary complaint of left posterior heel pain as well as right dorsal midfoot pain.  Patient states been going for quite some time is progressive gotten worse.  Some days are worse than others.  She has not seen MRIs prior to seeing me.  Hurts with ambulation.  She would like to get it evaluated.  She has not tried anything for it there is no aggravating factor.  Hurts all the time sometimes.  Pain scale is 8 out of 10 sharp shooting in nature sometimes with dull achy   Review of Systems: Negative except as noted in the HPI. Denies N/V/F/Ch.  Past Medical History:  Diagnosis Date   Anemia    Anginal pain (HCC)    Anxiety    Asthma    Cataract cortical, senile    CHF (congestive heart failure) (HCC)    Chronic headaches    Diastolic heart failure (HCC)    Diverticulosis    Environmental allergies    GERD (gastroesophageal reflux disease)    Heart murmur    Hyperglycemia    Hyperlipidemia    Hypertension    Leukocytosis    Obesity    Osteoarthritis    Palpitations    Restless leg syndrome    Sleep apnea    Thrombocytosis    Urinary incontinence    mixed   Venous insufficiency    Vitamin D deficiency    Wears dentures    partial upper    Current Outpatient Medications:    acetaminophen (TYLENOL) 500 MG tablet, Take 500 mg by mouth every 6 (six) hours as needed., Disp: , Rfl:    albuterol (VENTOLIN HFA) 108 (90 Base) MCG/ACT inhaler, Inhale 2 puffs into the lungs every 6 (six) hours as needed for wheezing or shortness of breath., Disp: 18 g, Rfl: 1   baclofen  (LIORESAL) 10 MG tablet, Take 10 mg by mouth 3 (three) times daily as needed for muscle spasms., Disp: , Rfl:    budesonide-formoterol (SYMBICORT) 80-4.5 MCG/ACT inhaler, Inhale 2 puffs into the lungs 2 (two) times daily., Disp: 1 each, Rfl: 0   Cholecalciferol (VITAMIN D-3) 1000 units CAPS, Take 1,000 Units by mouth daily., Disp: , Rfl:    clobetasol cream (TEMOVATE) 6.01 %, Apply 1 application topically 2 (two) times daily., Disp: 30 g, Rfl: 1   fluticasone (FLONASE) 50 MCG/ACT nasal spray, Place 2 sprays into both nostrils daily., Disp: 48 g, Rfl: 3   furosemide (LASIX) 20 MG tablet, Take 1 tablet (20 mg) by mouth once daily as needed for weight gain of 3 lbs or more overnight, Disp: , Rfl:    linaclotide (LINZESS) 290 MCG CAPS capsule, Take 290 mcg by mouth daily before breakfast., Disp: , Rfl:    melatonin 3 MG TABS tablet, Take 3 mg by mouth at bedtime., Disp: , Rfl:    metoprolol succinate (TOPROL-XL) 100 MG 24 hr tablet, TAKE 1 TABLET BY MOUTH  DAILY WITH OR IMMEDIATLEY  FOLLOWING A MEAL, Disp: 90 tablet,  Rfl: 3   montelukast (SINGULAIR) 10 MG tablet, TAKE 1 TABLET BY MOUTH AT  BEDTIME, Disp: 90 tablet, Rfl: 3   Omega-3 Fatty Acids (FISH OIL) 1000 MG CAPS, Take 1,000 mg by mouth., Disp: , Rfl:    ondansetron (ZOFRAN) 4 MG tablet, Take 1 tablet (4 mg total) by mouth 2 (two) times daily as needed for nausea or vomiting., Disp: 15 tablet, Rfl: 0   Peppermint Oil (IBGARD) 90 MG CPCR, Take by mouth., Disp: , Rfl:    potassium chloride (KLOR-CON) 10 MEQ tablet, Take 1 tablet (10 meq) by mouth once daily as needed only when taking lasix (furosemide), Disp: , Rfl:    pregabalin (LYRICA) 100 MG capsule, Take 100 mg by mouth at bedtime., Disp: , Rfl:    pregabalin (LYRICA) 50 MG capsule, Take by mouth. Take 1 capsule by mouth in the morning and 2 capsules at night., Disp: , Rfl:    Probiotic Product (PROBIOTIC-10 PO), Take 100 mcg by mouth., Disp: , Rfl:    RABEprazole (ACIPHEX) 20 MG tablet, Take 20  mg by mouth 2 (two) times daily., Disp: , Rfl:    rosuvastatin (CRESTOR) 10 MG tablet, TAKE 1 TABLET BY MOUTH  DAILY, Disp: 90 tablet, Rfl: 3   sucralfate (CARAFATE) 1 g tablet, Take 1 g by mouth daily as needed (GI sympotms). , Disp: , Rfl:    triamcinolone cream (KENALOG) 0.1 %, Apply 1 application topically 2 (two) times daily., Disp: 30 g, Rfl: 0   venlafaxine XR (EFFEXOR-XR) 150 MG 24 hr capsule, TAKE 1 CAPSULE BY MOUTH  DAILY WITH BREAKFAST, Disp: 90 capsule, Rfl: 3  Social History   Tobacco Use  Smoking Status Former   Packs/day: 1.00   Years: 15.00   Pack years: 15.00   Types: Cigarettes   Quit date: 08/17/1983   Years since quitting: 37.8  Smokeless Tobacco Never    Allergies  Allergen Reactions   Levaquin [Levofloxacin]    Augmentin [Amoxicillin-Pot Clavulanate] Other (See Comments)    Questionable itching   Bactrim [Sulfamethoxazole-Trimethoprim] Rash   Doxycycline Itching   Hydrocodone-Acetaminophen Other (See Comments)    GI distress   Pseudoephedrine Rash    Itching of the scalp   Shellfish Allergy Nausea And Vomiting    Nausea and vomiting    Objective:  There were no vitals filed for this visit. There is no height or weight on file to calculate BMI. Constitutional Well developed. Well nourished.  Vascular Dorsalis pedis pulses palpable bilaterally. Posterior tibial pulses palpable bilaterally. Capillary refill normal to all digits.  No cyanosis or clubbing noted. Pedal hair growth normal.  Neurologic Normal speech. Oriented to person, place, and time. Epicritic sensation to light touch grossly present bilaterally.  Dermatologic Nails well groomed and normal in appearance. No open wounds. No skin lesions.  Orthopedic: Pain on palpation left Achilles tendon insertion pain with range of motion including dorsiflexion of the ankle joint.  No pain with plantarflexion eversion inversion.  No deep intra-articular ankle pain noted.  Haglund's deformity  clinically appreciated.  Positive Silfverskiold test with gastrocnemius equinus  Pain on palpation of right dorsal midfoot at the first tarsometatarsal joint.  Clinically able to appreciate osteoarthritic changes/spurring.  No other bony abnormalities identified.  No extensor tendinitis noted.   Radiographs: 3 views of skeletally mature adult bilateral foot: Posterior and plantar heel spurring noted bilaterally.  Midfoot arthritis noted.  Mild ankle joint arthritis noted.  No other bony abnormalities identified.  No stress fracture noted. Assessment:  1. Achilles tendinitis, left leg   2. Arthritis of midfoot    Plan:  Patient was evaluated and treated and all questions answered.  Left Achilles tendinitis with underlying Haglund's deformity/gastrocnemius equinus -I explained the patient the etiology of Achilles tendinitis and various treatment options were discussed.  Given the amount of pain that she is having I believe she will benefit from cam boot immobilization.  If there is no improvement in cam boot immobilization will need to discuss steroid injection versus an MRI   Right midfoot arthritis -I explained to the patient the etiology of arthritis and various treatment options were discussed.  Given the amount of pain that she is having I believe she would benefit from steroid injection to help decrease acute inflammatory component associate with pain.  Patient agrees with plan like to proceed with steroid injection -A steroid injection was performed at Right dorsal first tarsometatarsal joint using 1% plain Lidocaine and 10 mg of Kenalog. This was well tolerated.    No follow-ups on file.

## 2021-06-08 ENCOUNTER — Ambulatory Visit: Payer: Medicare Other

## 2021-06-10 DIAGNOSIS — G43019 Migraine without aura, intractable, without status migrainosus: Secondary | ICD-10-CM | POA: Diagnosis not present

## 2021-06-10 DIAGNOSIS — R202 Paresthesia of skin: Secondary | ICD-10-CM | POA: Diagnosis not present

## 2021-06-10 DIAGNOSIS — I679 Cerebrovascular disease, unspecified: Secondary | ICD-10-CM | POA: Diagnosis not present

## 2021-06-10 DIAGNOSIS — G3184 Mild cognitive impairment, so stated: Secondary | ICD-10-CM | POA: Diagnosis not present

## 2021-06-10 DIAGNOSIS — G2581 Restless legs syndrome: Secondary | ICD-10-CM | POA: Diagnosis not present

## 2021-06-11 DIAGNOSIS — M3501 Sicca syndrome with keratoconjunctivitis: Secondary | ICD-10-CM | POA: Diagnosis not present

## 2021-06-17 ENCOUNTER — Other Ambulatory Visit: Payer: Self-pay

## 2021-06-17 ENCOUNTER — Inpatient Hospital Stay: Payer: Medicare Other | Attending: Oncology

## 2021-06-17 DIAGNOSIS — Z803 Family history of malignant neoplasm of breast: Secondary | ICD-10-CM | POA: Insufficient documentation

## 2021-06-17 DIAGNOSIS — M2550 Pain in unspecified joint: Secondary | ICD-10-CM | POA: Insufficient documentation

## 2021-06-17 DIAGNOSIS — E785 Hyperlipidemia, unspecified: Secondary | ICD-10-CM | POA: Diagnosis not present

## 2021-06-17 DIAGNOSIS — B3781 Candidal esophagitis: Secondary | ICD-10-CM | POA: Insufficient documentation

## 2021-06-17 DIAGNOSIS — Z818 Family history of other mental and behavioral disorders: Secondary | ICD-10-CM | POA: Insufficient documentation

## 2021-06-17 DIAGNOSIS — Z9049 Acquired absence of other specified parts of digestive tract: Secondary | ICD-10-CM | POA: Diagnosis not present

## 2021-06-17 DIAGNOSIS — Z885 Allergy status to narcotic agent status: Secondary | ICD-10-CM | POA: Diagnosis not present

## 2021-06-17 DIAGNOSIS — Z881 Allergy status to other antibiotic agents status: Secondary | ICD-10-CM | POA: Insufficient documentation

## 2021-06-17 DIAGNOSIS — Z8349 Family history of other endocrine, nutritional and metabolic diseases: Secondary | ICD-10-CM | POA: Insufficient documentation

## 2021-06-17 DIAGNOSIS — Z880 Allergy status to penicillin: Secondary | ICD-10-CM | POA: Diagnosis not present

## 2021-06-17 DIAGNOSIS — D75839 Thrombocytosis, unspecified: Secondary | ICD-10-CM | POA: Diagnosis not present

## 2021-06-17 DIAGNOSIS — R944 Abnormal results of kidney function studies: Secondary | ICD-10-CM

## 2021-06-17 DIAGNOSIS — E611 Iron deficiency: Secondary | ICD-10-CM | POA: Diagnosis not present

## 2021-06-17 DIAGNOSIS — I110 Hypertensive heart disease with heart failure: Secondary | ICD-10-CM | POA: Diagnosis not present

## 2021-06-17 DIAGNOSIS — Z87891 Personal history of nicotine dependence: Secondary | ICD-10-CM | POA: Insufficient documentation

## 2021-06-17 DIAGNOSIS — I5032 Chronic diastolic (congestive) heart failure: Secondary | ICD-10-CM | POA: Diagnosis not present

## 2021-06-17 DIAGNOSIS — R221 Localized swelling, mass and lump, neck: Secondary | ICD-10-CM | POA: Insufficient documentation

## 2021-06-17 DIAGNOSIS — Z79899 Other long term (current) drug therapy: Secondary | ICD-10-CM | POA: Insufficient documentation

## 2021-06-17 DIAGNOSIS — Z8249 Family history of ischemic heart disease and other diseases of the circulatory system: Secondary | ICD-10-CM | POA: Diagnosis not present

## 2021-06-17 DIAGNOSIS — R1310 Dysphagia, unspecified: Secondary | ICD-10-CM | POA: Diagnosis not present

## 2021-06-17 DIAGNOSIS — M7662 Achilles tendinitis, left leg: Secondary | ICD-10-CM | POA: Insufficient documentation

## 2021-06-17 DIAGNOSIS — G4730 Sleep apnea, unspecified: Secondary | ICD-10-CM | POA: Diagnosis not present

## 2021-06-17 DIAGNOSIS — Z833 Family history of diabetes mellitus: Secondary | ICD-10-CM | POA: Insufficient documentation

## 2021-06-17 DIAGNOSIS — R0981 Nasal congestion: Secondary | ICD-10-CM | POA: Diagnosis not present

## 2021-06-17 DIAGNOSIS — Z806 Family history of leukemia: Secondary | ICD-10-CM | POA: Insufficient documentation

## 2021-06-17 LAB — COMPREHENSIVE METABOLIC PANEL
ALT: 15 U/L (ref 0–44)
AST: 17 U/L (ref 15–41)
Albumin: 3.8 g/dL (ref 3.5–5.0)
Alkaline Phosphatase: 78 U/L (ref 38–126)
Anion gap: 6 (ref 5–15)
BUN: 11 mg/dL (ref 8–23)
CO2: 29 mmol/L (ref 22–32)
Calcium: 9.1 mg/dL (ref 8.9–10.3)
Chloride: 106 mmol/L (ref 98–111)
Creatinine, Ser: 0.97 mg/dL (ref 0.44–1.00)
GFR, Estimated: >60 mL/min (ref >60)
Glucose, Bld: 100 mg/dL — ABNORMAL HIGH (ref 70–99)
Potassium: 4.2 mmol/L (ref 3.5–5.1)
Sodium: 141 mmol/L (ref 135–145)
Total Bilirubin: 0.6 mg/dL (ref 0.3–1.2)
Total Protein: 7.5 g/dL (ref 6.5–8.1)

## 2021-06-17 LAB — CBC WITH DIFFERENTIAL/PLATELET
Abs Immature Granulocytes: 0.02 10*3/uL (ref 0.00–0.07)
Basophils Absolute: 0.1 10*3/uL (ref 0.0–0.1)
Basophils Relative: 1 %
Eosinophils Absolute: 0.2 10*3/uL (ref 0.0–0.5)
Eosinophils Relative: 3 %
HCT: 39.7 % (ref 36.0–46.0)
Hemoglobin: 12.1 g/dL (ref 12.0–15.0)
Immature Granulocytes: 0 %
Lymphocytes Relative: 35 %
Lymphs Abs: 2.8 10*3/uL (ref 0.7–4.0)
MCH: 27.4 pg (ref 26.0–34.0)
MCHC: 30.5 g/dL (ref 30.0–36.0)
MCV: 90 fL (ref 80.0–100.0)
Monocytes Absolute: 0.6 10*3/uL (ref 0.1–1.0)
Monocytes Relative: 8 %
Neutro Abs: 4.3 10*3/uL (ref 1.7–7.7)
Neutrophils Relative %: 53 %
Platelets: 419 10*3/uL — ABNORMAL HIGH (ref 150–400)
RBC: 4.41 MIL/uL (ref 3.87–5.11)
RDW: 13.5 % (ref 11.5–15.5)
WBC: 8 10*3/uL (ref 4.0–10.5)
nRBC: 0 % (ref 0.0–0.2)

## 2021-06-17 LAB — IRON AND TIBC
Iron: 72 ug/dL (ref 28–170)
Saturation Ratios: 21 % (ref 10.4–31.8)
TIBC: 343 ug/dL (ref 250–450)
UIBC: 271 ug/dL

## 2021-06-17 LAB — FERRITIN: Ferritin: 62 ng/mL (ref 11–307)

## 2021-06-19 ENCOUNTER — Encounter: Payer: Self-pay | Admitting: Oncology

## 2021-06-19 ENCOUNTER — Other Ambulatory Visit: Payer: Self-pay

## 2021-06-19 ENCOUNTER — Inpatient Hospital Stay: Payer: Medicare Other | Admitting: Oncology

## 2021-06-19 VITALS — BP 125/70 | HR 61 | Temp 96.8°F | Wt 234.0 lb

## 2021-06-19 DIAGNOSIS — Z87891 Personal history of nicotine dependence: Secondary | ICD-10-CM | POA: Diagnosis not present

## 2021-06-19 DIAGNOSIS — B3781 Candidal esophagitis: Secondary | ICD-10-CM

## 2021-06-19 DIAGNOSIS — I110 Hypertensive heart disease with heart failure: Secondary | ICD-10-CM | POA: Diagnosis not present

## 2021-06-19 DIAGNOSIS — M2550 Pain in unspecified joint: Secondary | ICD-10-CM | POA: Diagnosis not present

## 2021-06-19 DIAGNOSIS — R0981 Nasal congestion: Secondary | ICD-10-CM | POA: Diagnosis not present

## 2021-06-19 DIAGNOSIS — Z885 Allergy status to narcotic agent status: Secondary | ICD-10-CM | POA: Diagnosis not present

## 2021-06-19 DIAGNOSIS — Z79899 Other long term (current) drug therapy: Secondary | ICD-10-CM | POA: Diagnosis not present

## 2021-06-19 DIAGNOSIS — Z806 Family history of leukemia: Secondary | ICD-10-CM | POA: Diagnosis not present

## 2021-06-19 DIAGNOSIS — R1310 Dysphagia, unspecified: Secondary | ICD-10-CM | POA: Diagnosis not present

## 2021-06-19 DIAGNOSIS — D75839 Thrombocytosis, unspecified: Secondary | ICD-10-CM | POA: Diagnosis not present

## 2021-06-19 DIAGNOSIS — Z8249 Family history of ischemic heart disease and other diseases of the circulatory system: Secondary | ICD-10-CM | POA: Diagnosis not present

## 2021-06-19 DIAGNOSIS — Z9049 Acquired absence of other specified parts of digestive tract: Secondary | ICD-10-CM | POA: Diagnosis not present

## 2021-06-19 DIAGNOSIS — R221 Localized swelling, mass and lump, neck: Secondary | ICD-10-CM | POA: Diagnosis not present

## 2021-06-19 DIAGNOSIS — Z880 Allergy status to penicillin: Secondary | ICD-10-CM | POA: Diagnosis not present

## 2021-06-19 DIAGNOSIS — E611 Iron deficiency: Secondary | ICD-10-CM | POA: Diagnosis not present

## 2021-06-19 DIAGNOSIS — Z881 Allergy status to other antibiotic agents status: Secondary | ICD-10-CM | POA: Diagnosis not present

## 2021-06-19 DIAGNOSIS — E785 Hyperlipidemia, unspecified: Secondary | ICD-10-CM | POA: Diagnosis not present

## 2021-06-19 DIAGNOSIS — Z803 Family history of malignant neoplasm of breast: Secondary | ICD-10-CM | POA: Diagnosis not present

## 2021-06-19 DIAGNOSIS — G4730 Sleep apnea, unspecified: Secondary | ICD-10-CM | POA: Diagnosis not present

## 2021-06-19 DIAGNOSIS — I5032 Chronic diastolic (congestive) heart failure: Secondary | ICD-10-CM | POA: Diagnosis not present

## 2021-06-19 DIAGNOSIS — Z833 Family history of diabetes mellitus: Secondary | ICD-10-CM | POA: Diagnosis not present

## 2021-06-19 DIAGNOSIS — Z8349 Family history of other endocrine, nutritional and metabolic diseases: Secondary | ICD-10-CM | POA: Diagnosis not present

## 2021-06-19 DIAGNOSIS — M7662 Achilles tendinitis, left leg: Secondary | ICD-10-CM | POA: Diagnosis not present

## 2021-06-19 NOTE — Progress Notes (Signed)
Hematology/Oncology follow up note  [phone] [phone]   Patient Care Team: Dale [place]burg, MD as PCP - General (Internal Medicine) Iran Ouch, MD as PCP - Cardiology (Cardiology) Rickard Patience, MD as Consulting Physician (Hematology and Oncology)  REFERRING PROVIDER: Dale Plaquemines, MD CHIEF COMPLAINTS/REASON FOR VISIT:  Follow up for thrombocytosis and iron deficiency.  HISTORY OF PRESENTING ILLNESS:  Nicole Sims is a  70 y.o.  female with PMH listed below who was referred to me for evaluation of thromcbocytosis  Reviewed patient's labs which were obtained by PCP.  09/12/2018 labs showed elevated platelet counts at 529,000, Wbc 9.6 and hemoglobin 13.3  Reviewed patient's previous labs. Thrombocytosis onset is chronic onset , duration since at least 2014. No aggravating or elevated factors. Associated symptoms or signs:  Denies weight loss, fever, chills, fatigue, night sweats.  Context:  Smoking history: Denies Family history of polycythemia.  Denies History of iron deficiency anemia denies History of DVT denies She feels nauseated.  Scheduled for endoscopy in March 2020. sleep apnea on CPAP  #Previous work-up showed  ak2 V617F mutation, CARL, MPL, E12-15 negative. Peripheral blood smear showed normal WBC morphology platelet appears increased.  Platelet varies in size with large body platelets noted. Iron panel showed TIBC 413, saturation 16, ferritin 30.  Received Venofer IV 200 mg x 2  # 01/22/2019 CT angios chest PE showed no evidence of PE. She was seen by pulmonology for evaluation and Dr.Gonzalez felt that her SOB is out of proportion to her lung disease.  Blood work showed new leukocytosis. Smear was done, showed predominatly lymphocytosis, no remarkable morphological changes, no blast.  Patient had NM perfusion study which showed low probability of pulmonary thrombosis.  Heart echo showed LVEF 60-65%, grade 1 diastolic  dysfunction-impaired relaxation.   INTERVAL HISTORY Nicole Sims is a 70 y.o. female who has above history reviewed by me today presents for follow up visit for thrombocytosis   Patient recently had EGD done for evaluation of dysphagia.  Patient was found to have esophageal candidiasis and gastritis.  Patient was treated with a course of fluconazole.  Patient has finished antibiotics course and still has some residual symptoms. She also feels chronic congestion/neck swelling, chronic nausea. Patient was also recently seen by neurology. Patient has left Achilles tendinitis.  Patient was recommended to wear a cam boot immobilization.   Review of Systems  Constitutional:  Negative for appetite change, chills, fatigue and fever.  HENT:   Negative for hearing loss and voice change.        Congestion and neck swelling  Eyes:  Negative for eye problems.  Respiratory:  Negative for chest tightness, cough and shortness of breath.   Cardiovascular:  Negative for chest pain.  Gastrointestinal:  Positive for nausea. Negative for abdominal distention, abdominal pain and blood in stool.       Dysphagia,   Endocrine: Negative for hot flashes.  Genitourinary:  Negative for difficulty urinating and frequency.   Musculoskeletal:  Positive for arthralgias.  Skin:  Negative for itching and rash.  Neurological:  Negative for extremity weakness.  Hematological:  Negative for adenopathy.  Psychiatric/Behavioral:  Negative for confusion.    MEDICAL HISTORY:  Past Medical History:  Diagnosis Date   Anemia    Anginal pain (HCC)    Anxiety    Asthma    Cataract cortical, senile    CHF (congestive heart failure) (HCC)    Chronic headaches    Diastolic heart failure (HCC)  Diverticulosis    Environmental allergies    GERD (gastroesophageal reflux disease)    Heart murmur    Hyperglycemia    Hyperlipidemia    Hypertension    Leukocytosis    Obesity    Osteoarthritis    Palpitations    Restless  leg syndrome    Sleep apnea    Thrombocytosis    Urinary incontinence    mixed   Venous insufficiency    Vitamin D deficiency    Wears dentures    partial upper    SURGICAL HISTORY: Past Surgical History:  Procedure Laterality Date   BLADDER SURGERY     x2   [facility]   BREAST CYST ASPIRATION Bilateral 2005   approximate year   CARDIAC CATHETERIZATION     [place]   CATARACT EXTRACTION W/PHACO Right 04/24/2020   Procedure: CATARACT EXTRACTION PHACO AND INTRAOCULAR LENS PLACEMENT ([place]) RIGHT;  Surgeon: Birder Robson, MD;  Location: [facility];  Service: Ophthalmology;  Laterality: Right;  6.75 0:37.3   CATARACT EXTRACTION W/PHACO Left 01/27/2021   Procedure: CATARACT EXTRACTION PHACO AND INTRAOCULAR LENS PLACEMENT ([place]) LEFT;  Surgeon: Birder Robson, MD;  Location: [facility];  Service: Ophthalmology;  Laterality: Left;  6.75 00:43.9   CERVICAL CONE BIOPSY     CIS   CHOLECYSTECTOMY     COLONOSCOPY WITH PROPOFOL N/A 07/19/2016   Procedure: COLONOSCOPY WITH PROPOFOL;  Surgeon: Manya Silvas, MD;  Location: [facility] ENDOSCOPY;  Service: Endoscopy;  Laterality: N/A;   COLONOSCOPY WITH PROPOFOL N/A 10/20/2018   Procedure: COLONOSCOPY WITH PROPOFOL;  Surgeon: Lollie Sails, MD;  Location: [facility] ENDOSCOPY;  Service: Endoscopy;  Laterality: N/A;   ESOPHAGOGASTRODUODENOSCOPY N/A 05/12/2021   Procedure: ESOPHAGOGASTRODUODENOSCOPY (EGD);  Surgeon: Lesly Rubenstein, MD;  Location: [facility] ENDOSCOPY;  Service: Endoscopy;  Laterality: N/A;   ESOPHAGOGASTRODUODENOSCOPY (EGD) WITH PROPOFOL N/A 07/19/2016   Procedure: ESOPHAGOGASTRODUODENOSCOPY (EGD) WITH PROPOFOL;  Surgeon: Manya Silvas, MD;  Location: [facility] ENDOSCOPY;  Service: Endoscopy;  Laterality: N/A;   FLEXIBLE SIGMOIDOSCOPY     KNEE ARTHROSCOPY  08/13/08   knee replacement and revision     left   RECTOCELE REPAIR     RIGHT/LEFT HEART CATH AND CORONARY ANGIOGRAPHY Bilateral 04/10/2018   Procedure:  RIGHT/LEFT HEART CATH AND CORONARY ANGIOGRAPHY;  Surgeon: Wellington Hampshire, MD;  Location: [facility];  Service: Cardiovascular;  Laterality: Bilateral;   ROTATOR CUFF REPAIR     bilateral   SHOULDER SURGERY  11/17/05   TONSILLECTOMY  1962   VAGINAL HYSTERECTOMY  1974   abnormal pap and carcinoma in situ    SOCIAL HISTORY: Social History   Socioeconomic History   Marital status: Married    Spouse name: Not on file   Number of children: Not on file   Years of education: Not on file   Highest education level: Not on file  Occupational History   Occupation: retired  Tobacco Use   Smoking status: Former    Packs/day: 1.00    Years: 15.00    Pack years: 15.00    Types: Cigarettes    Quit date: 08/17/1983    Years since quitting: 37.8   Smokeless tobacco: Never  Vaping Use   Vaping Use: Never used  Substance and Sexual Activity   Alcohol use: Yes    Alcohol/week: 0.0 standard drinks    Comment: rarely   Drug use: No   Sexual activity: Not on file  Other Topics Concern   Not on file  Social History Narrative   Lives at home with husband   Social Determinants of Radio broadcast assistant Strain: Not on file  Food Insecurity: Not on file  Transportation Needs: Not on file  Physical Activity: Not on file  Stress: Not on file  Social Connections: Not on file  Intimate Partner Violence: Not on file    FAMILY HISTORY: Family History  Problem Relation Age of Onset   Diabetes Mellitus II Father    Thyroid disease Father    Alzheimer's disease Father    Breast cancer Maternal Aunt    Hypertension Mother    Heart Problems Brother    Leukemia Maternal Aunt    Alzheimer's disease Paternal Aunt    Alzheimer's disease Paternal Uncle    Alzheimer's disease Paternal Uncle    Alzheimer's disease Paternal Uncle    Alzheimer's disease Paternal Aunt    Colon cancer Neg Hx    Kidney cancer Neg Hx    Bladder Cancer Neg Hx     ALLERGIES:  is allergic to levaquin  [levofloxacin], augmentin [amoxicillin-pot clavulanate], bactrim [sulfamethoxazole-trimethoprim], doxycycline, hydrocodone-acetaminophen, pseudoephedrine, and shellfish allergy.  MEDICATIONS:  Current Outpatient Medications  Medication Sig Dispense Refill   acetaminophen (TYLENOL) 500 MG tablet Take 500 mg by mouth every 6 (six) hours as needed.     AIMOVIG 140 MG/ML SOAJ 140 Milligram(s) SUB-Q Once a Month     albuterol (VENTOLIN HFA) 108 (90 Base) MCG/ACT inhaler Inhale 2 puffs into the lungs every 6 (six) hours as needed for wheezing or shortness of breath. 18 g 1   baclofen (LIORESAL) 10 MG tablet Take 10 mg by mouth 3 (three) times daily as needed for muscle spasms.     budesonide-formoterol (SYMBICORT) 80-4.5 MCG/ACT inhaler Inhale 2 puffs into the lungs 2 (two) times daily. 1 each 0   Cholecalciferol (VITAMIN D-3) 1000 units CAPS Take 1,000 Units by mouth daily.     clobetasol cream (TEMOVATE) 6.46 % Apply 1 application topically 2 (two) times daily. 30 g 1   fluticasone (FLONASE) 50 MCG/ACT nasal spray Place 2 sprays into both nostrils daily. 48 g 3   furosemide (LASIX) 20 MG tablet Take 1 tablet (20 mg) by mouth once daily as needed for weight gain of 3 lbs or more overnight     linaclotide (LINZESS) 290 MCG CAPS capsule Take 290 mcg by mouth daily before breakfast.     melatonin 3 MG TABS tablet Take 3 mg by mouth at bedtime.     metoprolol succinate (TOPROL-XL) 100 MG 24 hr tablet TAKE 1 TABLET BY MOUTH  DAILY WITH OR IMMEDIATLEY  FOLLOWING A MEAL 90 tablet 3   montelukast (SINGULAIR) 10 MG tablet TAKE 1 TABLET BY MOUTH AT  BEDTIME 90 tablet 3   Omega-3 Fatty Acids (FISH OIL) 1000 MG CAPS Take 1,000 mg by mouth.     ondansetron (ZOFRAN) 4 MG tablet Take 1 tablet (4 mg total) by mouth 2 (two) times daily as needed for nausea or vomiting. 15 tablet 0   Peppermint Oil (IBGARD) 90 MG CPCR Take by mouth.     potassium chloride (KLOR-CON) 10 MEQ tablet Take 1 tablet (10 meq) by mouth once  daily as needed only when taking lasix (furosemide)     pregabalin (LYRICA) 50 MG capsule Take by mouth. Take 1 capsule by mouth in the morning and 2 capsules at night.     RABEprazole (ACIPHEX) 20 MG tablet Take 20 mg by mouth 2 (two) times daily.  rosuvastatin (CRESTOR) 10 MG tablet TAKE 1 TABLET BY MOUTH  DAILY 90 tablet 3   sucralfate (CARAFATE) 1 g tablet Take 1 g by mouth daily as needed (GI sympotms).      venlafaxine XR (EFFEXOR-XR) 150 MG 24 hr capsule TAKE 1 CAPSULE BY MOUTH  DAILY WITH BREAKFAST 90 capsule 3   vitamin B-12 (CYANOCOBALAMIN) 1000 MCG tablet Take 1,000 mcg by mouth daily.     pregabalin (LYRICA) 100 MG capsule Take 100 mg by mouth at bedtime. (Patient not taking: Reported on 06/19/2021)     Probiotic Product (PROBIOTIC-10 PO) Take 100 mcg by mouth. (Patient not taking: Reported on 06/19/2021)     triamcinolone cream (KENALOG) 0.1 % Apply 1 application topically 2 (two) times daily. (Patient not taking: Reported on 06/19/2021) 30 g 0   No current facility-administered medications for this visit.     PHYSICAL EXAMINATION: ECOG PERFORMANCE STATUS: 1 - Symptomatic but completely ambulatory Vitals:   06/19/21 1147  BP: 125/70  Pulse: 61  Temp: (!) 96.8 F (36 C)   Filed Weights   06/19/21 1147  Weight: 234 lb (106.1 kg)    Physical Exam Constitutional:      General: She is not in acute distress.    Appearance: She is obese.  HENT:     Head: Normocephalic and atraumatic.  Eyes:     General: No scleral icterus.    Pupils: Pupils are equal, round, and reactive to light.  Cardiovascular:     Rate and Rhythm: Normal rate and regular rhythm.     Heart sounds: Normal heart sounds.  Pulmonary:     Effort: Pulmonary effort is normal. No respiratory distress.     Breath sounds: No wheezing.  Abdominal:     General: Bowel sounds are normal. There is no distension.     Palpations: Abdomen is soft. There is no mass.     Tenderness: There is no abdominal  tenderness.  Musculoskeletal:        General: No deformity. Normal range of motion.     Cervical back: Normal range of motion and neck supple.  Skin:    General: Skin is warm and dry.     Findings: No erythema or rash.  Neurological:     Mental Status: She is alert and oriented to person, place, and time.     Cranial Nerves: No cranial nerve deficit.     Coordination: Coordination normal.  Psychiatric:        Behavior: Behavior normal.        Thought Content: Thought content normal.     LABORATORY DATA:  I have reviewed the data as listed Lab Results  Component Value Date   WBC 8.0 06/17/2021   HGB 12.1 06/17/2021   HCT 39.7 06/17/2021   MCV 90.0 06/17/2021   PLT 419 (H) 06/17/2021   Recent Labs    07/08/20 0919 12/10/20 0858 05/18/21 1209 06/17/21 1253  NA 140 140 140 141  K 4.7 4.1 4.1 4.2  CL 105 105 104 106  CO2 $Re'28 26 27 29  'VZQ$ GLUCOSE 103* 95 95 100*  BUN $Re'11 12 13 11  'GDv$ CREATININE 0.99 0.98 1.02 0.97  CALCIUM 9.3 9.0 9.5 9.1  GFRNONAA  --   --   --  >60  PROT 7.0 6.8 7.8 7.5  ALBUMIN 4.0 3.9 4.3 3.8  AST $Re'14 11 19 17  'kKZ$ ALT $R'11 9 16 15  'rw$ ALKPHOS 80 83 82 78  BILITOT 0.5 0.5 0.4 0.6  BILIDIR 0.1 0.1 0.1  --     Iron/TIBC/Ferritin/ %Sat    Component Value Date/Time   IRON 72 06/17/2021 1253   TIBC 343 06/17/2021 1253   FERRITIN 62 06/17/2021 1253   IRONPCTSAT 21 06/17/2021 1253    RADIOGRAPHIC STUDIES: I have personally reviewed the radiological images as listed and agreed with the findings in the report. 01/22/2019 CT angiogram  chest PE protocol No evidence of pulmonary embolus.   No acute cardiopulmonary disease. DG Foot Complete Left  Result Date: 06/02/2021 Please see detailed radiograph report in office note.  DG Foot Complete Right  Result Date: 06/02/2021 Please see detailed radiograph report in office note.   ASSESSMENT & PLAN:  1. Thrombocytosis   2. Esophageal candidiasis ([place])   3. Nasal congestion    Labs reviewed and discussed with  patient Thrombocytosis is chronic and counts are stable.  Slightly improved compared to 1 year ago. Most likely reactive.  She has had work-up done including bone marrow biopsy in the past which were nonremarkable. No constitutional symptoms I recommend patient to continue follow-up with her primary care provider.  No need to continue follow-up with hematology clinic at this point.  I am happy to see her in the future if she develops new symptoms or concerns  Esophageal candidiasis.  She finished her course of fluconazole.  Still has some residual symptoms.  Encourage patient to further discuss with gastroenterology. Etiology of candidiasis could be immunocompromisation secondary to chronic steroid inhaler usage, or other etiologies.  Follow-up with PCP.  Chronic congestion/neck swelling, on physical examination I did not feel any discrete lymphadenopathy.  Probably secondary to chronic allergy.   History of iron deficiency, iron panel showed adequate iron stores.    Patient will be discharged today.  She agrees with the plan.   Earlie Server, MD, PhD

## 2021-07-02 ENCOUNTER — Encounter: Payer: Self-pay | Admitting: Podiatry

## 2021-07-02 ENCOUNTER — Ambulatory Visit: Payer: Medicare Other | Admitting: Podiatry

## 2021-07-02 ENCOUNTER — Other Ambulatory Visit: Payer: Self-pay

## 2021-07-02 DIAGNOSIS — M9262 Juvenile osteochondrosis of tarsus, left ankle: Secondary | ICD-10-CM

## 2021-07-02 DIAGNOSIS — M7662 Achilles tendinitis, left leg: Secondary | ICD-10-CM

## 2021-07-02 NOTE — Progress Notes (Signed)
Subjective:  Patient ID: Nicole Sims, female    DOB: 07-24-1951,  MRN: [id number]  Chief Complaint  Patient presents with   Foot Pain    PT stated that she is fine in the boot but when she takes it off she still has some pain    70 y.o. female presents with the above complaint.  Patient presents now primarily with left Achilles tendinitis insertional pain.  She states is still painful.  She feels good in the boot however when she comes out of the boot it starts hurting.  She states that the right midfoot is doing much better she no longer has any pain there.  She denies any other acute complaints.  She would like to discuss next treatment plans.   Review of Systems: Negative except as noted in the HPI. Denies N/V/F/Ch.  Past Medical History:  Diagnosis Date   Anemia    Anginal pain (HCC)    Anxiety    Asthma    Cataract cortical, senile    CHF (congestive heart failure) (HCC)    Chronic headaches    Diastolic heart failure (HCC)    Diverticulosis    Environmental allergies    GERD (gastroesophageal reflux disease)    Heart murmur    Hyperglycemia    Hyperlipidemia    Hypertension    Leukocytosis    Obesity    Osteoarthritis    Palpitations    Restless leg syndrome    Sleep apnea    Thrombocytosis    Urinary incontinence    mixed   Venous insufficiency    Vitamin D deficiency    Wears dentures    partial upper    Current Outpatient Medications:    acetaminophen (TYLENOL) 500 MG tablet, Take 500 mg by mouth every 6 (six) hours as needed., Disp: , Rfl:    AIMOVIG 140 MG/ML SOAJ, 140 Milligram(s) SUB-Q Once a Month, Disp: , Rfl:    albuterol (VENTOLIN HFA) 108 (90 Base) MCG/ACT inhaler, Inhale 2 puffs into the lungs every 6 (six) hours as needed for wheezing or shortness of breath., Disp: 18 g, Rfl: 1   baclofen (LIORESAL) 10 MG tablet, Take 10 mg by mouth 3 (three) times daily as needed for muscle spasms., Disp: , Rfl:    budesonide-formoterol (SYMBICORT) 80-4.5  MCG/ACT inhaler, Inhale 2 puffs into the lungs 2 (two) times daily., Disp: 1 each, Rfl: 0   Cholecalciferol (VITAMIN D-3) 1000 units CAPS, Take 1,000 Units by mouth daily., Disp: , Rfl:    clobetasol cream (TEMOVATE) 7.78 %, Apply 1 application topically 2 (two) times daily., Disp: 30 g, Rfl: 1   fluticasone (FLONASE) 50 MCG/ACT nasal spray, Place 2 sprays into both nostrils daily., Disp: 48 g, Rfl: 3   furosemide (LASIX) 20 MG tablet, Take 1 tablet (20 mg) by mouth once daily as needed for weight gain of 3 lbs or more overnight, Disp: , Rfl:    linaclotide (LINZESS) 290 MCG CAPS capsule, Take 290 mcg by mouth daily before breakfast., Disp: , Rfl:    melatonin 3 MG TABS tablet, Take 3 mg by mouth at bedtime., Disp: , Rfl:    metoprolol succinate (TOPROL-XL) 100 MG 24 hr tablet, TAKE 1 TABLET BY MOUTH  DAILY WITH OR IMMEDIATLEY  FOLLOWING A MEAL, Disp: 90 tablet, Rfl: 3   montelukast (SINGULAIR) 10 MG tablet, TAKE 1 TABLET BY MOUTH AT  BEDTIME, Disp: 90 tablet, Rfl: 3   Omega-3 Fatty Acids (FISH OIL) 1000 MG CAPS, Take  1,000 mg by mouth., Disp: , Rfl:    ondansetron (ZOFRAN) 4 MG tablet, Take 1 tablet (4 mg total) by mouth 2 (two) times daily as needed for nausea or vomiting., Disp: 15 tablet, Rfl: 0   Peppermint Oil (IBGARD) 90 MG CPCR, Take by mouth., Disp: , Rfl:    potassium chloride (KLOR-CON) 10 MEQ tablet, Take 1 tablet (10 meq) by mouth once daily as needed only when taking lasix (furosemide), Disp: , Rfl:    pregabalin (LYRICA) 100 MG capsule, Take 100 mg by mouth at bedtime. (Patient not taking: Reported on 06/19/2021), Disp: , Rfl:    pregabalin (LYRICA) 50 MG capsule, Take by mouth. Take 1 capsule by mouth in the morning and 2 capsules at night., Disp: , Rfl:    Probiotic Product (PROBIOTIC-10 PO), Take 100 mcg by mouth. (Patient not taking: Reported on 06/19/2021), Disp: , Rfl:    RABEprazole (ACIPHEX) 20 MG tablet, Take 20 mg by mouth 2 (two) times daily., Disp: , Rfl:    rosuvastatin  (CRESTOR) 10 MG tablet, TAKE 1 TABLET BY MOUTH  DAILY, Disp: 90 tablet, Rfl: 3   sucralfate (CARAFATE) 1 g tablet, Take 1 g by mouth daily as needed (GI sympotms). , Disp: , Rfl:    triamcinolone cream (KENALOG) 0.1 %, Apply 1 application topically 2 (two) times daily. (Patient not taking: Reported on 06/19/2021), Disp: 30 g, Rfl: 0   venlafaxine XR (EFFEXOR-XR) 150 MG 24 hr capsule, TAKE 1 CAPSULE BY MOUTH  DAILY WITH BREAKFAST, Disp: 90 capsule, Rfl: 3   vitamin B-12 (CYANOCOBALAMIN) 1000 MCG tablet, Take 1,000 mcg by mouth daily., Disp: , Rfl:   Social History   Tobacco Use  Smoking Status Former   Packs/day: 1.00   Years: 15.00   Pack years: 15.00   Types: Cigarettes   Quit date: 08/17/1983   Years since quitting: 37.9  Smokeless Tobacco Never    Allergies  Allergen Reactions   Levaquin [Levofloxacin]    Augmentin [Amoxicillin-Pot Clavulanate] Other (See Comments)    Questionable itching   Bactrim [Sulfamethoxazole-Trimethoprim] Rash   Doxycycline Itching   Hydrocodone-Acetaminophen Other (See Comments)    GI distress   Pseudoephedrine Rash    Itching of the scalp   Shellfish Allergy Nausea And Vomiting    Nausea and vomiting    Objective:  There were no vitals filed for this visit. There is no height or weight on file to calculate BMI. Constitutional Well developed. Well nourished.  Vascular Dorsalis pedis pulses palpable bilaterally. Posterior tibial pulses palpable bilaterally. Capillary refill normal to all digits.  No cyanosis or clubbing noted. Pedal hair growth normal.  Neurologic Normal speech. Oriented to person, place, and time. Epicritic sensation to light touch grossly present bilaterally.  Dermatologic Nails well groomed and normal in appearance. No open wounds. No skin lesions.  Orthopedic: Pain on palpation left Achilles tendon insertion pain with range of motion including dorsiflexion of the ankle joint.  No pain with plantarflexion eversion  inversion.  No deep intra-articular ankle pain noted.  Haglund's deformity clinically appreciated.  Positive Silfverskiold test with gastrocnemius equinus  Pain on palpation of right dorsal midfoot at the first tarsometatarsal joint.  Clinically able to appreciate osteoarthritic changes/spurring.  No other bony abnormalities identified.  No extensor tendinitis noted.   Radiographs: 3 views of skeletally mature adult bilateral foot: Posterior and plantar heel spurring noted bilaterally.  Midfoot arthritis noted.  Mild ankle joint arthritis noted.  No other bony abnormalities identified.  No stress  fracture noted. Assessment:   1. Achilles tendinitis, left leg   2. Haglund's deformity, left     Plan:  Patient was evaluated and treated and all questions answered.  Left Achilles tendinitis with underlying Haglund's deformity/gastrocnemius equinus -I explained the patient the etiology of Achilles tendinitis and various treatment options were discussed.  -Continue cam boot immobilization -Given the amount of pain that she still having I believe she will benefit from a steroid injection to help decrease the residual inflammation that is present thus causing her pain.  Patient agrees with plan like to proceed with steroid injection.  I discussed with her there is a risk of rupture associated with it.  She states understand would like to proceed despite the risks -A steroid injection was performed at left Kager's fat pad using 1% plain Lidocaine and 10 mg of Kenalog. This was well tolerated.    Right midfoot arthritis -Clinically resolved    No follow-ups on file.

## 2021-07-06 ENCOUNTER — Encounter: Payer: Medicare Other | Admitting: Speech Pathology

## 2021-08-06 ENCOUNTER — Telehealth: Payer: Self-pay | Admitting: Internal Medicine

## 2021-08-06 NOTE — Telephone Encounter (Signed)
Alexie from [facility], phone # [phone] and fax # [phone]. She is looking for a medical clearance for patient that was faxed to our office on 07/20/2021.

## 2021-08-11 ENCOUNTER — Ambulatory Visit: Payer: Medicare Other | Admitting: Podiatry

## 2021-08-11 NOTE — Telephone Encounter (Signed)
LMTCB

## 2021-08-13 NOTE — Telephone Encounter (Signed)
LM for [facility] to return my call

## 2021-08-13 NOTE — Telephone Encounter (Signed)
Spoke with dental care. Advised that she will need pre-op in order to have clearance. No date scheduled yet but pt needs crowns and bridges They are going to fax form for me to hold for upcoming appt with PCP on 1/10 and then can move forward with medical clearance form. Patient is aware and gave verbal understanding of plan. Waiting to receive medical clearance form.

## 2021-08-19 ENCOUNTER — Telehealth: Payer: Self-pay | Admitting: Internal Medicine

## 2021-08-19 NOTE — Telephone Encounter (Signed)
Lexy is calling from dental care in  requesting an update for a medical clearance that was supposed to have been faxed back to dental care

## 2021-08-20 NOTE — Telephone Encounter (Signed)
Advised patient and dental care office that medical clearance form will not be completed or returned until pre op appt on 1/10.

## 2021-08-25 ENCOUNTER — Other Ambulatory Visit: Payer: Self-pay

## 2021-08-25 ENCOUNTER — Ambulatory Visit (INDEPENDENT_AMBULATORY_CARE_PROVIDER_SITE_OTHER): Payer: Medicare Other | Admitting: Internal Medicine

## 2021-08-25 ENCOUNTER — Other Ambulatory Visit (HOSPITAL_COMMUNITY)
Admission: RE | Admit: 2021-08-25 | Discharge: 2021-08-25 | Disposition: A | Discharge status: Home / Self Care | Payer: Medicare Other | Source: Ambulatory Visit | Attending: Internal Medicine | Admitting: Internal Medicine

## 2021-08-25 VITALS — BP 122/76 | HR 70 | Temp 97.5°F | Resp 16 | Ht 65.0 in | Wt 233.2 lb

## 2021-08-25 DIAGNOSIS — I700 Atherosclerosis of aorta: Secondary | ICD-10-CM

## 2021-08-25 DIAGNOSIS — R1310 Dysphagia, unspecified: Secondary | ICD-10-CM | POA: Diagnosis not present

## 2021-08-25 DIAGNOSIS — N898 Other specified noninflammatory disorders of vagina: Secondary | ICD-10-CM

## 2021-08-25 DIAGNOSIS — R519 Headache, unspecified: Secondary | ICD-10-CM

## 2021-08-25 DIAGNOSIS — E7800 Pure hypercholesterolemia, unspecified: Secondary | ICD-10-CM

## 2021-08-25 DIAGNOSIS — N760 Acute vaginitis: Secondary | ICD-10-CM | POA: Insufficient documentation

## 2021-08-25 DIAGNOSIS — K219 Gastro-esophageal reflux disease without esophagitis: Secondary | ICD-10-CM

## 2021-08-25 DIAGNOSIS — I471 Supraventricular tachycardia, unspecified: Secondary | ICD-10-CM

## 2021-08-25 DIAGNOSIS — D473 Essential (hemorrhagic) thrombocythemia: Secondary | ICD-10-CM | POA: Diagnosis not present

## 2021-08-25 DIAGNOSIS — D649 Anemia, unspecified: Secondary | ICD-10-CM

## 2021-08-25 DIAGNOSIS — R300 Dysuria: Secondary | ICD-10-CM | POA: Diagnosis not present

## 2021-08-25 DIAGNOSIS — R3915 Urgency of urination: Secondary | ICD-10-CM

## 2021-08-25 DIAGNOSIS — L900 Lichen sclerosus et atrophicus: Secondary | ICD-10-CM | POA: Diagnosis not present

## 2021-08-25 DIAGNOSIS — R739 Hyperglycemia, unspecified: Secondary | ICD-10-CM | POA: Diagnosis not present

## 2021-08-25 DIAGNOSIS — I6509 Occlusion and stenosis of unspecified vertebral artery: Secondary | ICD-10-CM

## 2021-08-25 DIAGNOSIS — I10 Essential (primary) hypertension: Secondary | ICD-10-CM

## 2021-08-25 DIAGNOSIS — Z98818 Other dental procedure status: Secondary | ICD-10-CM

## 2021-08-25 DIAGNOSIS — F439 Reaction to severe stress, unspecified: Secondary | ICD-10-CM

## 2021-08-25 DIAGNOSIS — M7660 Achilles tendinitis, unspecified leg: Secondary | ICD-10-CM

## 2021-08-25 DIAGNOSIS — G8929 Other chronic pain: Secondary | ICD-10-CM

## 2021-08-25 DIAGNOSIS — I5030 Unspecified diastolic (congestive) heart failure: Secondary | ICD-10-CM | POA: Diagnosis not present

## 2021-08-25 NOTE — Progress Notes (Addendum)
Patient ID: Nicole Sims, female   DOB: 05/04/1951, 71 y.o.   MRN: [id number]   Subjective:    Patient ID: Nicole Sims, female    DOB: 1951/08/07, 71 y.o.   MRN: [id number]  This visit occurred during the SARS-CoV-2 public health emergency.  Safety protocols were in place, including screening questions prior to the visit, additional usage of staff PPE, and extensive cleaning of exam room while observing appropriate contact time as indicated for disinfecting solutions.   Patient here for a scheduled follow up.    HPI Here to follow up regarding her blood pressure, cholesterol and increased stress.  Evaluated by hematology 06/2021 - f/u thrombocytosis.  Stable.  Recommended no further intervention.  Felt to be reactive.  She has also seen neurology for f/u sensory peripheral neuropathy.  Negative myopathy work up.  Recommended balance exercises.  Continue lyrica, effexor and baclofen prn.  Also recommended continuing aspriin and statin - given history of intracranial stenosis of right vertebral artery and moderate white matter microvascular ischemic changes.  Started aimovig for migraine prevention.  Seeing podiatry for f/u achilles tendinitis.  She reports having vaginal irritation.  Has seen gyn.  Has a history of lichen sclerosus.  Has used clobetasol. Last evaluated 05/2021 was instructed to use desitin to vulva and return in four weeks.  PAP 05/21/21 - negative. On questioning, she describes increased urgency.  No actual dysuria.  Some vaginal irritation.  No discharge.  Some pressure and fullness in her bladder.  No bowel change reported.  No chest pain reported.  Breathing stable.  No increased cough or congestion.  Question of some dysphagia.  Eating and drinking ok.  Has been treated for esophageal candidiasis.  No vomiting.  Planning to have dental procedure tomorrow.     Past Medical History:  Diagnosis Date   Anemia    Anginal pain (HCC)    Anxiety    Asthma    Cataract cortical, senile     CHF (congestive heart failure) (HCC)    Chronic headaches    Diastolic heart failure (HCC)    Diverticulosis    Environmental allergies    GERD (gastroesophageal reflux disease)    Heart murmur    Hyperglycemia    Hyperlipidemia    Hypertension    Leukocytosis    Obesity    Osteoarthritis    Palpitations    Restless leg syndrome    Sleep apnea    Thrombocytosis    Urinary incontinence    mixed   Venous insufficiency    Vitamin D deficiency    Wears dentures    partial upper   Past Surgical History:  Procedure Laterality Date   BLADDER SURGERY     x2   [facility]   BREAST CYST ASPIRATION Bilateral 2005   approximate year   CARDIAC CATHETERIZATION     [place]   CATARACT EXTRACTION W/PHACO Right 04/24/2020   Procedure: CATARACT EXTRACTION PHACO AND INTRAOCULAR LENS PLACEMENT ([place]) RIGHT;  Surgeon: Birder Robson, MD;  Location: [place];  Service: Ophthalmology;  Laterality: Right;  6.75 0:37.3   CATARACT EXTRACTION W/PHACO Left 01/27/2021   Procedure: CATARACT EXTRACTION PHACO AND INTRAOCULAR LENS PLACEMENT ([place]) LEFT;  Surgeon: Birder Robson, MD;  Location: [place];  Service: Ophthalmology;  Laterality: Left;  6.75 00:43.9   CERVICAL CONE BIOPSY     CIS   CHOLECYSTECTOMY     COLONOSCOPY WITH PROPOFOL N/A 07/19/2016   Procedure: COLONOSCOPY WITH PROPOFOL;  Surgeon:  Manya Silvas, MD;  Location: [facility] ENDOSCOPY;  Service: Endoscopy;  Laterality: N/A;   COLONOSCOPY WITH PROPOFOL N/A 10/20/2018   Procedure: COLONOSCOPY WITH PROPOFOL;  Surgeon: Lollie Sails, MD;  Location: [facility] ENDOSCOPY;  Service: Endoscopy;  Laterality: N/A;   ESOPHAGOGASTRODUODENOSCOPY N/A 05/12/2021   Procedure: ESOPHAGOGASTRODUODENOSCOPY (EGD);  Surgeon: Lesly Rubenstein, MD;  Location: [facility] ENDOSCOPY;  Service: Endoscopy;  Laterality: N/A;   ESOPHAGOGASTRODUODENOSCOPY (EGD) WITH PROPOFOL N/A 07/19/2016   Procedure: ESOPHAGOGASTRODUODENOSCOPY (EGD) WITH  PROPOFOL;  Surgeon: Manya Silvas, MD;  Location: [facility] ENDOSCOPY;  Service: Endoscopy;  Laterality: N/A;   FLEXIBLE SIGMOIDOSCOPY     KNEE ARTHROSCOPY  08/13/08   knee replacement and revision     left   RECTOCELE REPAIR     RIGHT/LEFT HEART CATH AND CORONARY ANGIOGRAPHY Bilateral 04/10/2018   Procedure: RIGHT/LEFT HEART CATH AND CORONARY ANGIOGRAPHY;  Surgeon: Wellington [place], MD;  Location: [place] [facility];  Service: Cardiovascular;  Laterality: Bilateral;   ROTATOR CUFF REPAIR     bilateral   SHOULDER SURGERY  11/17/05   TONSILLECTOMY  1962   VAGINAL HYSTERECTOMY  1974   abnormal pap and carcinoma in situ   Family History  Problem Relation Age of Onset   Diabetes Mellitus II Father    Thyroid disease Father    Alzheimer's disease Father    Breast cancer Maternal Aunt    Hypertension Mother    Heart Problems Brother    Leukemia Maternal Aunt    Alzheimer's disease Paternal Aunt    Alzheimer's disease Paternal Uncle    Alzheimer's disease Paternal Uncle    Alzheimer's disease Paternal Uncle    Alzheimer's disease Paternal Aunt    Colon cancer Neg Hx    Kidney cancer Neg Hx    Bladder Cancer Neg Hx    Social History   Socioeconomic History   Marital status: Married    Spouse name: Not on file   Number of children: Not on file   Years of education: Not on file   Highest education level: Not on file  Occupational History   Occupation: retired  Tobacco Use   Smoking status: Former    Packs/day: 1.00    Years: 15.00    Pack years: 15.00    Types: Cigarettes    Quit date: 08/17/1983    Years since quitting: 38.0   Smokeless tobacco: Never  Vaping Use   Vaping Use: Never used  Substance and Sexual Activity   Alcohol use: Yes    Alcohol/week: 0.0 standard drinks    Comment: rarely   Drug use: No   Sexual activity: Not on file  Other Topics Concern   Not on file  Social History Narrative   Lives at home with husband   Social Determinants of Health    Financial Resource Strain: Not on file  Food Insecurity: Not on file  Transportation Needs: Not on file  Physical Activity: Not on file  Stress: Not on file  Social Connections: Not on file     Review of Systems  Constitutional:  Negative for appetite change and unexpected weight change.  HENT:  Negative for congestion and sinus pressure.   Respiratory:  Negative for cough, chest tightness and shortness of breath.   Cardiovascular:  Negative for chest pain, palpitations and leg swelling.  Gastrointestinal:  Negative for diarrhea, nausea and vomiting.       Pressure over bladder.  No other abdominal pain.    Genitourinary:  Positive for urgency.  Negative for difficulty urinating, dysuria and vaginal discharge.  Musculoskeletal:  Negative for joint swelling and myalgias.  Skin:  Negative for color change and rash.  Neurological:  Negative for dizziness, light-headedness and headaches.  Psychiatric/Behavioral:  Negative for agitation and dysphoric mood.       Objective:     BP 122/76    Pulse 70    Temp (!) 97.5 F (36.4 C)    Resp 16    Ht $R'5\' 5"'Ni$  (1.651 m)    Wt 233 lb 3.2 oz (105.8 kg)    SpO2 99%    BMI 38.81 kg/m  Wt Readings from Last 3 Encounters:  08/25/21 233 lb 3.2 oz (105.8 kg)  06/19/21 234 lb (106.1 kg)  05/18/21 234 lb 3.2 oz (106.2 kg)    Physical Exam Vitals reviewed.  Constitutional:      General: She is not in acute distress.    Appearance: Normal appearance.  HENT:     Head: Normocephalic and atraumatic.     Right Ear: External ear normal.     Left Ear: External ear normal.  Eyes:     General: No scleral icterus.       Right eye: No discharge.        Left eye: No discharge.     Conjunctiva/sclera: Conjunctivae normal.  Neck:     Thyroid: No thyromegaly.  Cardiovascular:     Rate and Rhythm: Normal rate and regular rhythm.  Pulmonary:     Effort: No respiratory distress.     Breath sounds: Normal breath sounds. No wheezing.  Abdominal:      General: Bowel sounds are normal.     Palpations: Abdomen is soft.     Tenderness: There is no abdominal tenderness.  Genitourinary:    Comments: Normal external genitalia.  Vaginal vault without lesions.  KOH/wet prep obtained. Could not appreciate any adnexal masses or tenderness.   Musculoskeletal:        General: No swelling or tenderness.     Cervical back: Neck supple. No tenderness.  Lymphadenopathy:     Cervical: No cervical adenopathy.  Skin:    Findings: No erythema or rash.  Neurological:     Mental Status: She is alert.  Psychiatric:        Mood and Affect: Mood normal.        Behavior: Behavior normal.     Outpatient Encounter Medications as of 08/25/2021  Medication Sig   acetaminophen (TYLENOL) 500 MG tablet Take 500 mg by mouth every 6 (six) hours as needed.   AIMOVIG 140 MG/ML SOAJ 140 Milligram(s) SUB-Q Once a Month   albuterol (VENTOLIN HFA) 108 (90 Base) MCG/ACT inhaler Inhale 2 puffs into the lungs every 6 (six) hours as needed for wheezing or shortness of breath.   aspirin 325 MG tablet Take 325 mg by mouth daily.   baclofen (LIORESAL) 10 MG tablet Take 10 mg by mouth 3 (three) times daily as needed for muscle spasms.   budesonide-formoterol (SYMBICORT) 80-4.5 MCG/ACT inhaler Inhale 2 puffs into the lungs 2 (two) times daily.   Cholecalciferol (VITAMIN D-3) 1000 units CAPS Take 1,000 Units by mouth daily.   clobetasol cream (TEMOVATE) 3.60 % Apply 1 application topically 2 (two) times daily.   fluticasone (FLONASE) 50 MCG/ACT nasal spray Place 2 sprays into both nostrils daily.   furosemide (LASIX) 20 MG tablet Take 1 tablet (20 mg) by mouth once daily as needed for weight gain of 3 lbs or more overnight  linaclotide (LINZESS) 290 MCG CAPS capsule Take 290 mcg by mouth daily before breakfast.   melatonin 3 MG TABS tablet Take 3 mg by mouth at bedtime.   metoprolol succinate (TOPROL-XL) 100 MG 24 hr tablet TAKE 1 TABLET BY MOUTH  DAILY WITH OR IMMEDIATLEY   FOLLOWING A MEAL   montelukast (SINGULAIR) 10 MG tablet TAKE 1 TABLET BY MOUTH AT  BEDTIME   Omega-3 Fatty Acids (FISH OIL) 1000 MG CAPS Take 1,000 mg by mouth.   ondansetron (ZOFRAN) 4 MG tablet Take 1 tablet (4 mg total) by mouth 2 (two) times daily as needed for nausea or vomiting.   Peppermint Oil (IBGARD) 90 MG CPCR Take by mouth.   potassium chloride (KLOR-CON) 10 MEQ tablet Take 1 tablet (10 meq) by mouth once daily as needed only when taking lasix (furosemide)   pregabalin (LYRICA) 50 MG capsule Take by mouth. Take 1 capsule by mouth in the morning and 2 capsules at night.   RABEprazole (ACIPHEX) 20 MG tablet Take 20 mg by mouth 2 (two) times daily.   rosuvastatin (CRESTOR) 10 MG tablet TAKE 1 TABLET BY MOUTH  DAILY   sucralfate (CARAFATE) 1 g tablet Take 1 g by mouth daily as needed (GI sympotms).    venlafaxine XR (EFFEXOR-XR) 150 MG 24 hr capsule TAKE 1 CAPSULE BY MOUTH  DAILY WITH BREAKFAST   vitamin B-12 (CYANOCOBALAMIN) 1000 MCG tablet Take 1,000 mcg by mouth daily.   [DISCONTINUED] pregabalin (LYRICA) 100 MG capsule Take 100 mg by mouth at bedtime. (Patient not taking: Reported on 06/19/2021)   [DISCONTINUED] Probiotic Product (PROBIOTIC-10 PO) Take 100 mcg by mouth. (Patient not taking: Reported on 06/19/2021)   [DISCONTINUED] triamcinolone cream (KENALOG) 0.1 % Apply 1 application topically 2 (two) times daily. (Patient not taking: Reported on 06/19/2021)   No facility-administered encounter medications on file as of 08/25/2021.     Lab Results  Component Value Date   WBC 8.0 06/17/2021   HGB 12.1 06/17/2021   HCT 39.7 06/17/2021   PLT 419 (H) 06/17/2021   GLUCOSE 100 (H) 06/17/2021   CHOL 205 (H) 05/18/2021   TRIG 198.0 (H) 05/18/2021   HDL 58.60 05/18/2021   LDLDIRECT 172.8 09/17/2013   LDLCALC 107 (H) 05/18/2021   ALT 15 06/17/2021   AST 17 06/17/2021   NA 141 06/17/2021   K 4.2 06/17/2021   CL 106 06/17/2021   CREATININE 0.97 06/17/2021   BUN 11 06/17/2021   CO2  29 06/17/2021   TSH 2.06 12/10/2020   INR 1.0 06/11/2019   HGBA1C 6.0 05/18/2021   MICROALBUR 1.1 04/28/2015       Assessment & Plan:   Problem List Items Addressed This Visit     Achilles tendon pain    Being followed by podiatry.       Anemia    Follow cbc. Previous iron panel showed adequate iron stores per hematology.        Aortic atherosclerosis (HCC)    Continue crestor.       Relevant Medications   aspirin 325 MG tablet   Chronic headaches    Diagnosed with migraine headaches.  Seeing neurology.  Trial of aimovig.  Continue melatonin and vitamin B2.        Relevant Medications   aspirin 325 MG tablet   Diastolic heart failure (HCC)    History of diastolic dysfunction.  EF 60-65%.  No increased sob reported.  Follow. Continue metoprolol.       Relevant Medications  aspirin 325 MG tablet   Dysphagia    Seeing GI.  Has been treated for candida esophagitis.  Some persistent dysphagia.  Discussed f/u with GI.       Essential (hemorrhagic) thrombocythemia ([place])    Worked up previously by hematology.  Follow cbc.  Felt to be reactive.        Relevant Medications   aspirin 325 MG tablet   Essential hypertension    Continue toprol.  Blood pressure doing well.  Follow blood pressure.  Follow metabolic panel.       Relevant Medications   aspirin 325 MG tablet   Gastroesophageal reflux disease    Continue aciphex.  Continue f/u with GI.       Hypercholesteremia    Continue crestor.  Low cholesterol diet and exercise.  Follow lipid panel and liver function tests.        Relevant Medications   aspirin 325 MG tablet   Hyperglycemia    Low carb diet and exercise.  Follow met b and a1c.       Lichen sclerosus    Just saw gyn 05/2021.  PAP negative.  Continue f/u with GYN>       Other dental procedure status    Planning to have two crowns place tomorrow.  Has continued aspirin.  Per dental office, the procedure can be performed with her on aspirin.  Her  procedure is scheduled for tomorrow.  Denies any increased heart rate or palpitations.  Continue metoprolol.  There are planning to use non epinephrine containing local numbing.  She discussed with ortho and they did not recommend abx prior to procedure.       PSVT (paroxysmal supraventricular tachycardia) ([place])    Has a history of PSVT.  Has seen Dr Fletcher Anon.  On metoprolol.  No increased heart rate or palpitations reported.  Continue metoprolol.        Relevant Medications   aspirin 325 MG tablet   Stenosis of vertebral artery without cerebral infarction    Continue aspirin and statin.        Relevant Medications   aspirin 325 MG tablet   Stress    Continue effexor.  Overall appears to be stable.  Follow.       Urinary urgency    With urinary urgency and bladder pressure, check urine to confirm if infection present.       Vaginal irritation    KOH/wet prep obtained.  Seeing gyn for f/u of lichen sclerosus.  Last visit, instructed to also use desitin.        Other Visit Diagnoses     Dysuria    -  Primary   Relevant Orders   Urinalysis, Routine w reflex microscopic (Completed)   Urine Culture (Completed)   Acute vaginitis       Relevant Orders   Cervicovaginal ancillary only( [place]) (Completed)       I spent 45 minutes with the patient and more than 50% of the time was spent in consultation regarding the above.  Time spent discussing current concerns and issues.  Time spent discussing her dental procedure.  Time also spent discussing further evaluation and treatment.   Einar Pheasant, MD

## 2021-08-26 LAB — URINALYSIS, ROUTINE W REFLEX MICROSCOPIC
Bilirubin Urine: NEGATIVE
Hgb urine dipstick: NEGATIVE
Ketones, ur: NEGATIVE
Nitrite: NEGATIVE
RBC / HPF: NONE SEEN (ref 0–?)
Specific Gravity, Urine: 1.015 (ref 1.000–1.030)
Total Protein, Urine: NEGATIVE
Urine Glucose: NEGATIVE
Urobilinogen, UA: 0.2 (ref 0.0–1.0)
pH: 6 (ref 5.0–8.0)

## 2021-08-26 LAB — URINE CULTURE
MICRO NUMBER:: 12850923
SPECIMEN QUALITY:: ADEQUATE

## 2021-08-27 ENCOUNTER — Telehealth: Payer: Self-pay

## 2021-08-27 LAB — CERVICOVAGINAL ANCILLARY ONLY
Bacterial Vaginitis (gardnerella): NEGATIVE
Candida Glabrata: NEGATIVE
Candida Vaginitis: NEGATIVE
Comment: NEGATIVE
Comment: NEGATIVE
Comment: NEGATIVE

## 2021-08-27 MED ORDER — NITROFURANTOIN MONOHYD MACRO 100 MG PO CAPS: 100.0000 mg | ORAL_CAPSULE | Freq: Two times a day (BID) | ORAL | 0 refills | Status: DC

## 2021-08-27 NOTE — Telephone Encounter (Signed)
Sent Macrobid 100 mg to pt pharmacy

## 2021-08-30 ENCOUNTER — Encounter: Payer: Self-pay | Admitting: Internal Medicine

## 2021-08-30 ENCOUNTER — Telehealth: Payer: Self-pay | Admitting: Internal Medicine

## 2021-08-30 DIAGNOSIS — I6509 Occlusion and stenosis of unspecified vertebral artery: Secondary | ICD-10-CM | POA: Insufficient documentation

## 2021-08-30 DIAGNOSIS — N898 Other specified noninflammatory disorders of vagina: Secondary | ICD-10-CM | POA: Insufficient documentation

## 2021-08-30 DIAGNOSIS — I471 Supraventricular tachycardia: Secondary | ICD-10-CM | POA: Insufficient documentation

## 2021-08-30 DIAGNOSIS — R3915 Urgency of urination: Secondary | ICD-10-CM | POA: Insufficient documentation

## 2021-08-30 DIAGNOSIS — Z98818 Other dental procedure status: Secondary | ICD-10-CM | POA: Insufficient documentation

## 2021-08-30 NOTE — Assessment & Plan Note (Signed)
Continue aspirin and statin. 

## 2021-08-30 NOTE — Assessment & Plan Note (Signed)
Continue effexor.  Overall appears to be stable.  Follow.

## 2021-08-30 NOTE — Assessment & Plan Note (Addendum)
Planning to have two crowns place tomorrow.  Has continued aspirin.  Per dental office, the procedure can be performed with her on aspirin.  Her procedure is scheduled for tomorrow.  Denies any increased heart rate or palpitations.  Continue metoprolol.  There are planning to use non epinephrine containing local numbing.  She discussed with ortho and they did not recommend abx prior to procedure.

## 2021-08-30 NOTE — Assessment & Plan Note (Signed)
Follow cbc. Previous iron panel showed adequate iron stores per hematology.   

## 2021-08-30 NOTE — Assessment & Plan Note (Signed)
With urinary urgency and bladder pressure, check urine to confirm if infection present.

## 2021-08-30 NOTE — Telephone Encounter (Signed)
She has a history of dysphagia.  Has been evaluated by GI.  Treated for esophageal candidiasis.  Still with issues.  Need to make sure has f/u with GI.  If not, need to schedule.  Also, confirm she did ok with her dental procedure.

## 2021-08-30 NOTE — Assessment & Plan Note (Signed)
Has a history of PSVT.  Has seen Dr Arida.  On metoprolol.  No increased heart rate or palpitations reported.  Continue metoprolol.   

## 2021-08-30 NOTE — Assessment & Plan Note (Signed)
Seeing GI.  Has been treated for candida esophagitis.  Some persistent dysphagia.  Discussed f/u with GI.

## 2021-08-30 NOTE — Assessment & Plan Note (Signed)
History of diastolic dysfunction.  EF 60-65%.  No increased sob reported.  Follow. Continue metoprolol.  

## 2021-08-30 NOTE — Assessment & Plan Note (Signed)
Diagnosed with migraine headaches.  Seeing neurology.  Trial of aimovig.  Continue melatonin and vitamin B2.

## 2021-08-30 NOTE — Assessment & Plan Note (Signed)
Low carb diet and exercise.  Follow met b and a1c.

## 2021-08-30 NOTE — Assessment & Plan Note (Signed)
Continue crestor.  Low cholesterol diet and exercise. Follow lipid panel and liver function tests.   

## 2021-08-30 NOTE — Assessment & Plan Note (Signed)
Continue crestor 

## 2021-08-30 NOTE — Assessment & Plan Note (Signed)
KOH/wet prep obtained.  Seeing gyn for f/u of lichen sclerosus.  Last visit, instructed to also use desitin.

## 2021-08-30 NOTE — Assessment & Plan Note (Signed)
Just saw gyn 05/2021.  PAP negative.  Continue f/u with GYN>

## 2021-08-30 NOTE — Assessment & Plan Note (Signed)
Worked up previously by hematology.  Follow cbc.  Felt to be reactive.

## 2021-08-30 NOTE — Assessment & Plan Note (Signed)
Continue aciphex.  Continue f/u with GI.

## 2021-08-30 NOTE — Assessment & Plan Note (Signed)
Continue toprol.  Blood pressure doing well.  Follow blood pressure.  Follow metabolic panel.  

## 2021-08-30 NOTE — Assessment & Plan Note (Signed)
Being followed by podiatry.

## 2021-08-31 NOTE — Telephone Encounter (Signed)
Spoke with pt and she stated that she does not have a follow up scheduled with GI but will call them to get scheduled. Pt also stated that the dental procedure went well, she didn't even need to be numbed.

## 2021-09-10 ENCOUNTER — Encounter: Payer: Self-pay | Admitting: Internal Medicine

## 2021-09-10 NOTE — Telephone Encounter (Signed)
Confirmed doing ok. Would like to discuss medication. Patient scheduled for virtual visit. Confirmed no sob, etc

## 2021-09-11 ENCOUNTER — Encounter: Payer: Self-pay | Admitting: Family

## 2021-09-11 ENCOUNTER — Telehealth (INDEPENDENT_AMBULATORY_CARE_PROVIDER_SITE_OTHER): Payer: Medicare Other | Admitting: Family

## 2021-09-11 ENCOUNTER — Other Ambulatory Visit: Payer: Self-pay

## 2021-09-11 VITALS — Ht 65.0 in | Wt 233.0 lb

## 2021-09-11 DIAGNOSIS — U071 COVID-19: Secondary | ICD-10-CM | POA: Insufficient documentation

## 2021-09-11 MED ORDER — MOLNUPIRAVIR EUA 200MG CAPSULE: 4.0000 | ORAL_CAPSULE | Freq: Two times a day (BID) | ORAL | 0 refills | Status: AC

## 2021-09-11 MED ORDER — AZITHROMYCIN 250 MG PO TABS: ORAL_TABLET | ORAL | 0 refills | Status: AC

## 2021-09-11 NOTE — Progress Notes (Signed)
MyChart Video Visit    Virtual Visit via Video Note   This visit type was conducted due to national recommendations for restrictions regarding the COVID-19 Pandemic (e.g. social distancing) in an effort to limit this patient's exposure and mitigate transmission in our community. This patient is at least at moderate risk for complications without adequate follow up. This format is felt to be most appropriate for this patient at this time. Physical exam was limited by quality of the video and audio technology used for the visit. CMA was able to get the patient set up on a video visit.  Patient location: Home. Patient and provider in visit Provider location: Office  I discussed the limitations of evaluation and management by telemedicine and the availability of in person appointments. The patient expressed understanding and agreed to proceed.  Visit Date: 09/11/2021  Today's healthcare provider: Eugenia Pancoast, FNP    Subjective:  Patient ID: Nicole Sims, female    DOB: 06/15/1951  Age: 71 y.o. MRN: [id number]  CC:  Chief Complaint  Patient presents with   Covid Positive   Cough   Sore Throat   Headache   Nasal Congestion    HPI Nicole Sims is here today with concerns via telephone visit.   Four days ago started with headache, cough and fever (up to 101.4 F one night ago) . Also with slight sore throat. No ear pain. This am with productive cough that was yellow/green sputum.  Tested positive for covid 09/09/21.   Taking tylenol otc for the fever and also robitussin for the cough which provides some mild symptom relief.   Past Medical History:  Diagnosis Date   Anemia    Anginal pain (HCC)    Anxiety    Asthma    Cataract cortical, senile    CHF (congestive heart failure) (HCC)    Chronic headaches    Diastolic heart failure (HCC)    Diverticulosis    Environmental allergies    GERD (gastroesophageal reflux disease)    Heart murmur    Hyperglycemia     Hyperlipidemia    Hypertension    Leukocytosis    Obesity    Osteoarthritis    Palpitations    Restless leg syndrome    Sleep apnea    Thrombocytosis    Urinary incontinence    mixed   Venous insufficiency    Vitamin D deficiency    Wears dentures    partial upper    Past Surgical History:  Procedure Laterality Date   BLADDER SURGERY     x2   [place] [facility]   BREAST CYST ASPIRATION Bilateral 2005   approximate year   CARDIAC CATHETERIZATION     [place]   CATARACT EXTRACTION W/PHACO Right 04/24/2020   Procedure: CATARACT EXTRACTION PHACO AND INTRAOCULAR LENS PLACEMENT ([place]) RIGHT;  Surgeon: Birder Robson, MD;  Location: [place];  Service: Ophthalmology;  Laterality: Right;  6.75 0:37.3   CATARACT EXTRACTION W/PHACO Left 01/27/2021   Procedure: CATARACT EXTRACTION PHACO AND INTRAOCULAR LENS PLACEMENT ([place]) LEFT;  Surgeon: Birder Robson, MD;  Location: [facility];  Service: Ophthalmology;  Laterality: Left;  6.75 00:43.9   CERVICAL CONE BIOPSY     CIS   CHOLECYSTECTOMY     COLONOSCOPY WITH PROPOFOL N/A 07/19/2016   Procedure: COLONOSCOPY WITH PROPOFOL;  Surgeon: Manya Silvas, MD;  Location[facility] ENDOSCOPY;  Service: Endoscopy;  Laterality: N/A;   COLONOSCOPY WITH PROPOFOL N/A 10/20/2018   Procedure: COLONOSCOPY WITH PROPOFOL;  Surgeon: Lollie Sails, MD;  Location: [facility] ENDOSCOPY;  Service: Endoscopy;  Laterality: N/A;   ESOPHAGOGASTRODUODENOSCOPY N/A 05/12/2021   Procedure: ESOPHAGOGASTRODUODENOSCOPY (EGD);  Surgeon: Lesly Rubenstein, MD;  Location: [facility] ENDOSCOPY;  Service: Endoscopy;  Laterality: N/A;   ESOPHAGOGASTRODUODENOSCOPY (EGD) WITH PROPOFOL N/A 07/19/2016   Procedure: ESOPHAGOGASTRODUODENOSCOPY (EGD) WITH PROPOFOL;  Surgeon: Manya Silvas, MD;  Location: [facility] ENDOSCOPY;  Service: Endoscopy;  Laterality: N/A;   FLEXIBLE SIGMOIDOSCOPY     KNEE ARTHROSCOPY  08/13/08   knee replacement and revision     left   RECTOCELE  REPAIR     RIGHT/LEFT HEART CATH AND CORONARY ANGIOGRAPHY Bilateral 04/10/2018   Procedure: RIGHT/LEFT HEART CATH AND CORONARY ANGIOGRAPHY;  Surgeon: Wellington Hampshire, MD;  Location: [place] [facility];  Service: Cardiovascular;  Laterality: Bilateral;   ROTATOR CUFF REPAIR     bilateral   SHOULDER SURGERY  11/17/05   TONSILLECTOMY  1962   VAGINAL HYSTERECTOMY  1974   abnormal pap and carcinoma in situ    Family History  Problem Relation Age of Onset   Diabetes Mellitus II Father    Thyroid disease Father    Alzheimer's disease Father    Breast cancer Maternal Aunt    Hypertension Mother    Heart Problems Brother    Leukemia Maternal Aunt    Alzheimer's disease Paternal Aunt    Alzheimer's disease Paternal Uncle    Alzheimer's disease Paternal Uncle    Alzheimer's disease Paternal Uncle    Alzheimer's disease Paternal Aunt    Colon cancer Neg Hx    Kidney cancer Neg Hx    Bladder Cancer Neg Hx     Social History   Socioeconomic History   Marital status: Married    Spouse name: Not on file   Number of children: Not on file   Years of education: Not on file   Highest education level: Not on file  Occupational History   Occupation: retired  Tobacco Use   Smoking status: Former    Packs/day: 1.00    Years: 15.00    Pack years: 15.00    Types: Cigarettes    Quit date: 08/17/1983    Years since quitting: 38.0   Smokeless tobacco: Never  Vaping Use   Vaping Use: Never used  Substance and Sexual Activity   Alcohol use: Yes    Alcohol/week: 0.0 standard drinks    Comment: rarely   Drug use: No   Sexual activity: Not on file  Other Topics Concern   Not on file  Social History Narrative   Lives at home with husband   Social Determinants of Health   Financial Resource Strain: Not on file  Food Insecurity: Not on file  Transportation Needs: Not on file  Physical Activity: Not on file  Stress: Not on file  Social Connections: Not on file  Intimate Partner  Violence: Not on file    Outpatient Medications Prior to Visit  Medication Sig Dispense Refill   acetaminophen (TYLENOL) 500 MG tablet Take 500 mg by mouth every 6 (six) hours as needed.     AIMOVIG 140 MG/ML SOAJ 140 Milligram(s) SUB-Q Once a Month     albuterol (VENTOLIN HFA) 108 (90 Base) MCG/ACT inhaler Inhale 2 puffs into the lungs every 6 (six) hours as needed for wheezing or shortness of breath. 18 g 1   aspirin 325 MG tablet Take 325 mg by mouth daily.     baclofen (LIORESAL) 10 MG tablet Take 10 mg  by mouth 3 (three) times daily as needed for muscle spasms.     budesonide-formoterol (SYMBICORT) 80-4.5 MCG/ACT inhaler Inhale 2 puffs into the lungs 2 (two) times daily. 1 each 0   Cholecalciferol (VITAMIN D-3) 1000 units CAPS Take 1,000 Units by mouth daily.     clobetasol cream (TEMOVATE) 8.08 % Apply 1 application topically 2 (two) times daily. 30 g 1   fluticasone (FLONASE) 50 MCG/ACT nasal spray Place 2 sprays into both nostrils daily. 48 g 3   furosemide (LASIX) 20 MG tablet Take 1 tablet (20 mg) by mouth once daily as needed for weight gain of 3 lbs or more overnight     linaclotide (LINZESS) 290 MCG CAPS capsule Take 290 mcg by mouth daily before breakfast.     melatonin 3 MG TABS tablet Take 3 mg by mouth at bedtime.     metoprolol succinate (TOPROL-XL) 100 MG 24 hr tablet TAKE 1 TABLET BY MOUTH  DAILY WITH OR IMMEDIATLEY  FOLLOWING A MEAL 90 tablet 3   montelukast (SINGULAIR) 10 MG tablet TAKE 1 TABLET BY MOUTH AT  BEDTIME 90 tablet 3   nitrofurantoin, macrocrystal-monohydrate, (MACROBID) 100 MG capsule Take 1 capsule (100 mg total) by mouth 2 (two) times daily. 10 capsule 0   Omega-3 Fatty Acids (FISH OIL) 1000 MG CAPS Take 1,000 mg by mouth.     ondansetron (ZOFRAN) 4 MG tablet Take 1 tablet (4 mg total) by mouth 2 (two) times daily as needed for nausea or vomiting. 15 tablet 0   Peppermint Oil (IBGARD) 90 MG CPCR Take by mouth.     potassium chloride (KLOR-CON) 10 MEQ tablet  Take 1 tablet (10 meq) by mouth once daily as needed only when taking lasix (furosemide)     pregabalin (LYRICA) 50 MG capsule Take by mouth. Take 1 capsule by mouth in the morning and 2 capsules at night.     RABEprazole (ACIPHEX) 20 MG tablet Take 20 mg by mouth 2 (two) times daily.     rosuvastatin (CRESTOR) 10 MG tablet TAKE 1 TABLET BY MOUTH  DAILY 90 tablet 3   sucralfate (CARAFATE) 1 g tablet Take 1 g by mouth daily as needed (GI sympotms).      venlafaxine XR (EFFEXOR-XR) 150 MG 24 hr capsule TAKE 1 CAPSULE BY MOUTH  DAILY WITH BREAKFAST 90 capsule 3   vitamin B-12 (CYANOCOBALAMIN) 1000 MCG tablet Take 1,000 mcg by mouth daily.     No facility-administered medications prior to visit.    Allergies  Allergen Reactions   Levaquin [Levofloxacin]    Augmentin [Amoxicillin-Pot Clavulanate] Other (See Comments)    Questionable itching   Bactrim [Sulfamethoxazole-Trimethoprim] Rash   Doxycycline Itching   Hydrocodone-Acetaminophen Other (See Comments)    GI distress   Pseudoephedrine Rash    Itching of the scalp   Shellfish Allergy Nausea And Vomiting    Nausea and vomiting     ROS Review of Systems  Constitutional:  Positive for fatigue and fever. Negative for chills.  HENT:  Positive for congestion, postnasal drip and sore throat. Negative for ear pain and sinus pressure.   Respiratory:  Positive for cough. Negative for shortness of breath and wheezing.   Cardiovascular:  Negative for chest pain and palpitations.     Objective:    Physical Exam Vitals reviewed.  Neurological:     General: No focal deficit present.     Mental Status: She is alert and oriented to person, place, and time.  Psychiatric:  Mood and Affect: Mood normal.        Behavior: Behavior normal.    Ht 5\' 5"  (1.651 m)    Wt 233 lb (105.7 kg)    BMI 38.77 kg/m  Wt Readings from Last 3 Encounters:  09/11/21 233 lb (105.7 kg)  08/25/21 233 lb 3.2 oz (105.8 kg)  06/19/21 234 lb (106.1 kg)      Health Maintenance Due  Topic Date Due   COVID-19 Vaccine (4 - Booster for Pfizer series) 02/22/2021    There are no preventive care reminders to display for this patient.  Lab Results  Component Value Date   TSH 2.06 12/10/2020   Lab Results  Component Value Date   WBC 8.0 06/17/2021   HGB 12.1 06/17/2021   HCT 39.7 06/17/2021   MCV 90.0 06/17/2021   PLT 419 (H) 06/17/2021   Lab Results  Component Value Date   NA 141 06/17/2021   K 4.2 06/17/2021   CO2 29 06/17/2021   GLUCOSE 100 (H) 06/17/2021   BUN 11 06/17/2021   CREATININE 0.97 06/17/2021   BILITOT 0.6 06/17/2021   ALKPHOS 78 06/17/2021   AST 17 06/17/2021   ALT 15 06/17/2021   PROT 7.5 06/17/2021   ALBUMIN 3.8 06/17/2021   CALCIUM 9.1 06/17/2021   ANIONGAP 6 06/17/2021   GFR 55.52 (L) 05/18/2021   Lab Results  Component Value Date   HGBA1C 6.0 05/18/2021      Assessment & Plan:   Problem List Items Addressed This Visit       Other   COVID-19 - Primary    High risk pt, covid positive.  rx for molnupiravir rx zpack Ok for otc guaifenesin (no DM)  Advised of CDC guidelines for self isolation/ ending isolation.  Advised of safe practice guidelines. Symptom Tier reviewed.  Encouraged to monitor for any worsening symptoms; watch for increased shortness of breath, weakness, and signs of dehydration. Advised when to seek emergency care.  Instructed to rest and hydrate well.  Advised to leave the house during recommended isolation period, only if it is necessary to seek medical care       Relevant Medications   molnupiravir EUA (LAGEVRIO) 200 mg CAPS capsule   azithromycin (ZITHROMAX) 250 MG tablet    Meds ordered this encounter  Medications   molnupiravir EUA (LAGEVRIO) 200 mg CAPS capsule    Sig: Take 4 capsules (800 mg total) by mouth 2 (two) times daily for 5 days.    Dispense:  40 capsule    Refill:  0    Order Specific Question:   Supervising Provider    Answer:   BEDSOLE, AMY E [[id number]]    azithromycin (ZITHROMAX) 250 MG tablet    Sig: Take 2 tablets on day 1, then 1 tablet daily on days 2 through 5    Dispense:  6 tablet    Refill:  0    Order Specific Question:   Supervising Provider    Answer:   Diona Browner, AMY E [[id number]]    Follow-up: Return in about 10 days (around 09/21/2021), or if symptoms worsen or fail to improve, for in office for covid follow up .    Eugenia Pancoast, FNP

## 2021-09-11 NOTE — Assessment & Plan Note (Addendum)
High risk pt, covid positive.  rx for molnupiravir rx zpack [place] for otc guaifenesin (no DM)  Advised of CDC guidelines for self isolation/ ending isolation.  Advised of safe practice guidelines. Symptom Tier reviewed.  Encouraged to monitor for any worsening symptoms; watch for increased shortness of breath, weakness, and signs of dehydration. Advised when to seek emergency care.  Instructed to rest and hydrate well.  Advised to leave the house during recommended isolation period, only if it is necessary to seek medical care

## 2021-09-11 NOTE — Patient Instructions (Signed)
Antibiotic sent to preferred pharmacy as well as antiviral medication.  Please increase oral fluids, steamy hot shower/humidifier prn.  Your COVID19 PCR test is POSITIVE.  Let us know right away if any worsening shortness of breath or persistent productive cough or fever.   Follow these current CDC guidelines for self-isolation:  - Stay home for 5 days, starting the day after your symptoms (The first day is really day 0). - If you have no symptoms or your symptoms are resolving after 5 days, you can leave your house. - Continue to wear a mask around others for 5 additional days. **If you have a fever, continue to stay home until your fever resolves for 24 hours without fever-reducing medications.**  It was a pleasure seeing you today! Please do not hesitate to reach out with any questions and or concerns.  Regards,   Eugenia Pancoast FNP-C

## 2021-09-22 NOTE — Telephone Encounter (Signed)
See note and see me before calling.  I can see her Friday at 9:30 or Monday at 12:00

## 2021-09-22 NOTE — Telephone Encounter (Signed)
Confirmed with patient that only symptoms are mild cough and lingering congestion. Symptoms started 1/25. Wants to see you in person. Can I schedule her for friday?

## 2021-09-23 NOTE — Telephone Encounter (Signed)
Pt scheduled for Friday at 9:30

## 2021-09-25 ENCOUNTER — Ambulatory Visit (INDEPENDENT_AMBULATORY_CARE_PROVIDER_SITE_OTHER): Payer: Medicare Other

## 2021-09-25 ENCOUNTER — Other Ambulatory Visit: Payer: Self-pay

## 2021-09-25 ENCOUNTER — Ambulatory Visit (INDEPENDENT_AMBULATORY_CARE_PROVIDER_SITE_OTHER): Payer: Medicare Other | Admitting: Internal Medicine

## 2021-09-25 ENCOUNTER — Encounter: Payer: Self-pay | Admitting: Internal Medicine

## 2021-09-25 VITALS — BP 122/70 | HR 65 | Temp 97.9°F | Resp 16 | Ht 65.0 in | Wt 233.0 lb

## 2021-09-25 DIAGNOSIS — I5030 Unspecified diastolic (congestive) heart failure: Secondary | ICD-10-CM

## 2021-09-25 DIAGNOSIS — R053 Chronic cough: Secondary | ICD-10-CM

## 2021-09-25 DIAGNOSIS — I700 Atherosclerosis of aorta: Secondary | ICD-10-CM

## 2021-09-25 DIAGNOSIS — K219 Gastro-esophageal reflux disease without esophagitis: Secondary | ICD-10-CM | POA: Diagnosis not present

## 2021-09-25 DIAGNOSIS — R300 Dysuria: Secondary | ICD-10-CM

## 2021-09-25 DIAGNOSIS — I6509 Occlusion and stenosis of unspecified vertebral artery: Secondary | ICD-10-CM

## 2021-09-25 DIAGNOSIS — I10 Essential (primary) hypertension: Secondary | ICD-10-CM | POA: Diagnosis not present

## 2021-09-25 DIAGNOSIS — I471 Supraventricular tachycardia, unspecified: Secondary | ICD-10-CM

## 2021-09-25 DIAGNOSIS — R0781 Pleurodynia: Secondary | ICD-10-CM

## 2021-09-25 DIAGNOSIS — R0789 Other chest pain: Secondary | ICD-10-CM

## 2021-09-25 DIAGNOSIS — R059 Cough, unspecified: Secondary | ICD-10-CM | POA: Diagnosis not present

## 2021-09-25 DIAGNOSIS — J392 Other diseases of pharynx: Secondary | ICD-10-CM | POA: Diagnosis not present

## 2021-09-25 LAB — URINALYSIS, ROUTINE W REFLEX MICROSCOPIC
Bilirubin Urine: NEGATIVE
Hgb urine dipstick: NEGATIVE
Ketones, ur: NEGATIVE
Nitrite: NEGATIVE
Specific Gravity, Urine: <=1.005 — AB (ref 1.000–1.030)
Total Protein, Urine: NEGATIVE
Urine Glucose: NEGATIVE
Urobilinogen, UA: 0.2 (ref 0.0–1.0)
pH: 5.5 (ref 5.0–8.0)

## 2021-09-25 MED ORDER — PREDNISONE 10 MG PO TABS: ORAL_TABLET | ORAL | 0 refills | Status: DC

## 2021-09-25 NOTE — Patient Instructions (Signed)
Saline nasal spray - flush nose at least 2x/day  Flonase nasal spray - 2 sprays each nostril one time per day.  Do this in the evening.    Continue the robitussin.   Prednisone taper as directed.

## 2021-09-25 NOTE — Progress Notes (Addendum)
Patient ID: Nicole Sims, female   DOB: 1951-06-11, 71 y.o.   MRN: [id number]   Subjective:    Patient ID: Nicole Sims, female    DOB: [birth year], 71 y.o.   MRN: [id number]  This visit occurred during the SARS-CoV-2 public health emergency.  Safety protocols were in place, including screening questions prior to the visit, additional usage of staff PPE, and extensive cleaning of exam room while observing appropriate contact time as indicated for disinfecting solutions.   Patient here for work in appt.   Chief Complaint  Patient presents with   Cough   Chest Congestion   .   HPI Was evaluated 09/11/30 - Covid positive.  Symptoms started 09/08/21.  Tested positive 09/09/21.  Treated with molnupiravir, zpak and robitussin.  Symptoms have improved.  Still with runny nose, cough and congestion.  Minimal headache.  Stuffy nose.  Some throat irritation.  Cough is occasionally productive.  Occasional nausea.  No diarrhea.  Concerned regarding white plaque - posterior OP.  Still with coughing fits.  Taking robitussin prn.  No chest pain or sob reported.  Does report right anterior rib pain.  Persistent intermittent issue. Was going on before covid.  No abdominal pain.    Past Medical History:  Diagnosis Date   Anemia    Anginal pain (HCC)    Anxiety    Asthma    Cataract cortical, senile    CHF (congestive heart failure) (HCC)    Chronic headaches    Diastolic heart failure (HCC)    Diverticulosis    Environmental allergies    GERD (gastroesophageal reflux disease)    Heart murmur    Hyperglycemia    Hyperlipidemia    Hypertension    Leukocytosis    Obesity    Osteoarthritis    Palpitations    Restless leg syndrome    Sleep apnea    Thrombocytosis    Urinary incontinence    mixed   Venous insufficiency    Vitamin D deficiency    Wears dentures    partial upper   Past Surgical History:  Procedure Laterality Date   BLADDER SURGERY     x2   [place] [facility] [place]   BREAST CYST  ASPIRATION Bilateral 2005   approximate year   CARDIAC CATHETERIZATION     [place]   CATARACT EXTRACTION W/PHACO Right 04/24/2020   Procedure: CATARACT EXTRACTION PHACO AND INTRAOCULAR LENS PLACEMENT ([place]) RIGHT;  Surgeon: Birder Robson, MD;  Location: [facility];  Service: Ophthalmology;  Laterality: Right;  6.75 0:37.3   CATARACT EXTRACTION W/PHACO Left 01/27/2021   Procedure: CATARACT EXTRACTION PHACO AND INTRAOCULAR LENS PLACEMENT ([place]) LEFT;  Surgeon: Birder Robson, MD;  Location: [facility];  Service: Ophthalmology;  Laterality: Left;  6.75 00:43.9   CERVICAL CONE BIOPSY     CIS   CHOLECYSTECTOMY     COLONOSCOPY WITH PROPOFOL N/A 07/19/2016   Procedure: COLONOSCOPY WITH PROPOFOL;  Surgeon: Manya Silvas, MD;  Location[facility] ENDOSCOPY;  Service: Endoscopy;  Laterality: N/A;   COLONOSCOPY WITH PROPOFOL N/A 10/20/2018   Procedure: COLONOSCOPY WITH PROPOFOL;  Surgeon: Lollie Sails, MD;  Location: [facility] ENDOSCOPY;  Service: Endoscopy;  Laterality: N/A;   ESOPHAGOGASTRODUODENOSCOPY N/A 05/12/2021   Procedure: ESOPHAGOGASTRODUODENOSCOPY (EGD);  Surgeon: Lesly Rubenstein, MD;  Location: [facility] ENDOSCOPY;  Service: Endoscopy;  Laterality: N/A;   ESOPHAGOGASTRODUODENOSCOPY (EGD) WITH PROPOFOL N/A 07/19/2016   Procedure: ESOPHAGOGASTRODUODENOSCOPY (EGD) WITH PROPOFOL;  Surgeon: Manya Silvas, MD;  Location: [facility];  Service: Endoscopy;  Laterality: N/A;   FLEXIBLE SIGMOIDOSCOPY     KNEE ARTHROSCOPY  08/13/08   knee replacement and revision     left   RECTOCELE REPAIR     RIGHT/LEFT HEART CATH AND CORONARY ANGIOGRAPHY Bilateral 04/10/2018   Procedure: RIGHT/LEFT HEART CATH AND CORONARY ANGIOGRAPHY;  Surgeon: Wellington Hampshire, MD;  Location: [facility];  Service: Cardiovascular;  Laterality: Bilateral;   ROTATOR CUFF REPAIR     bilateral   SHOULDER SURGERY  11/17/05   TONSILLECTOMY  1962   VAGINAL HYSTERECTOMY  1974   abnormal pap and carcinoma  in situ   Family History  Problem Relation Age of Onset   Diabetes Mellitus II Father    Thyroid disease Father    Alzheimer's disease Father    Breast cancer Maternal Aunt    Hypertension Mother    Heart Problems Brother    Leukemia Maternal Aunt    Alzheimer's disease Paternal Aunt    Alzheimer's disease Paternal Uncle    Alzheimer's disease Paternal Uncle    Alzheimer's disease Paternal Uncle    Alzheimer's disease Paternal Aunt    Colon cancer Neg Hx    Kidney cancer Neg Hx    Bladder Cancer Neg Hx    Social History   Socioeconomic History   Marital status: Married    Spouse name: Not on file   Number of children: Not on file   Years of education: Not on file   Highest education level: Not on file  Occupational History   Occupation: retired  Tobacco Use   Smoking status: Former    Packs/day: 1.00    Years: 15.00    Pack years: 15.00    Types: Cigarettes    Quit date: 08/17/1983    Years since quitting: 38.1   Smokeless tobacco: Never  Vaping Use   Vaping Use: Never used  Substance and Sexual Activity   Alcohol use: Yes    Alcohol/week: 0.0 standard drinks    Comment: rarely   Drug use: No   Sexual activity: Not on file  Other Topics Concern   Not on file  Social History Narrative   Lives at home with husband   Social Determinants of Health   Financial Resource Strain: Not on file  Food Insecurity: Not on file  Transportation Needs: Not on file  Physical Activity: Not on file  Stress: Not on file  Social Connections: Not on file     Review of Systems  Constitutional:  Positive for fatigue. Negative for appetite change and unexpected weight change.  HENT:  Positive for congestion, postnasal drip and sore throat.   Respiratory:  Positive for cough. Negative for chest tightness and shortness of breath.   Cardiovascular:  Negative for chest pain, palpitations and leg swelling.       Rib pain as outlined.   Gastrointestinal:  Negative for abdominal  pain, diarrhea and vomiting.       Occasional nausea.   Genitourinary:  Positive for dysuria. Negative for difficulty urinating.  Musculoskeletal:  Negative for joint swelling and myalgias.  Skin:  Negative for color change and rash.  Neurological:  Negative for dizziness and light-headedness.       Minimal headache.   Psychiatric/Behavioral:  Negative for agitation and dysphoric mood.       Objective:     BP 122/70    Pulse 65    Temp 97.9 F (36.6 C)    Resp 16    Ht  5\' 5"  (1.651 m)    Wt 233 lb (105.7 kg)    SpO2 99%    BMI 38.77 kg/m  Wt Readings from Last 3 Encounters:  09/25/21 233 lb (105.7 kg)  09/11/21 233 lb (105.7 kg)  08/25/21 233 lb 3.2 oz (105.8 kg)    Physical Exam Vitals reviewed.  Constitutional:      General: She is not in acute distress.    Appearance: Normal appearance.  HENT:     Head: Normocephalic and atraumatic.     Right Ear: External ear normal.     Left Ear: External ear normal.     Mouth/Throat:     Comments: No significant erythema - posterior OP.  Minimal white lesion - posterior (left) OP Eyes:     General: No scleral icterus.       Right eye: No discharge.        Left eye: No discharge.     Conjunctiva/sclera: Conjunctivae normal.  Neck:     Thyroid: No thyromegaly.  Cardiovascular:     Rate and Rhythm: Normal rate and regular rhythm.  Pulmonary:     Effort: No respiratory distress.     Breath sounds: Normal breath sounds. No wheezing.  Abdominal:     General: Bowel sounds are normal.     Palpations: Abdomen is soft.     Tenderness: There is no abdominal tenderness.  Musculoskeletal:        General: No swelling or tenderness.     Cervical back: Neck supple. No tenderness.  Lymphadenopathy:     Cervical: No cervical adenopathy.  Skin:    Findings: No erythema or rash.  Neurological:     Mental Status: She is alert.  Psychiatric:        Mood and Affect: Mood normal.        Behavior: Behavior normal.     Outpatient Encounter  Medications as of 09/25/2021  Medication Sig   nystatin (MYCOSTATIN) 100000 UNIT/ML suspension 5 mls - swish and spit tid.   predniSONE (DELTASONE) 10 MG tablet Take 4 tablets x 1 day and then decrease by 1/2 tablet per day until down to zero mg.   acetaminophen (TYLENOL) 500 MG tablet Take 500 mg by mouth every 6 (six) hours as needed.   AIMOVIG 140 MG/ML SOAJ 140 Milligram(s) SUB-Q Once a Month   albuterol (VENTOLIN HFA) 108 (90 Base) MCG/ACT inhaler Inhale 2 puffs into the lungs every 6 (six) hours as needed for wheezing or shortness of breath.   aspirin 325 MG tablet Take 325 mg by mouth daily.   baclofen (LIORESAL) 10 MG tablet Take 10 mg by mouth 3 (three) times daily as needed for muscle spasms.   budesonide-formoterol (SYMBICORT) 80-4.5 MCG/ACT inhaler Inhale 2 puffs into the lungs 2 (two) times daily.   Cholecalciferol (VITAMIN D-3) 1000 units CAPS Take 1,000 Units by mouth daily.   fluticasone (FLONASE) 50 MCG/ACT nasal spray Place 2 sprays into both nostrils daily.   furosemide (LASIX) 20 MG tablet Take 1 tablet (20 mg) by mouth once daily as needed for weight gain of 3 lbs or more overnight   linaclotide (LINZESS) 290 MCG CAPS capsule Take 290 mcg by mouth daily before breakfast.   metoprolol succinate (TOPROL-XL) 100 MG 24 hr tablet TAKE 1 TABLET BY MOUTH  DAILY WITH OR IMMEDIATLEY  FOLLOWING A MEAL   montelukast (SINGULAIR) 10 MG tablet TAKE 1 TABLET BY MOUTH AT  BEDTIME   nitrofurantoin, macrocrystal-monohydrate, (MACROBID) 100 MG capsule Take  1 capsule (100 mg total) by mouth 2 (two) times daily.   Omega-3 Fatty Acids (FISH OIL) 1000 MG CAPS Take 1,000 mg by mouth.   ondansetron (ZOFRAN) 4 MG tablet Take 1 tablet (4 mg total) by mouth 2 (two) times daily as needed for nausea or vomiting.   potassium chloride (KLOR-CON) 10 MEQ tablet Take 1 tablet (10 meq) by mouth once daily as needed only when taking lasix (furosemide)   pregabalin (LYRICA) 50 MG capsule Take by mouth. Take 1  capsule by mouth in the morning and 2 capsules at night.   RABEprazole (ACIPHEX) 20 MG tablet Take 20 mg by mouth 2 (two) times daily.   rosuvastatin (CRESTOR) 10 MG tablet TAKE 1 TABLET BY MOUTH  DAILY (Patient taking differently: Take 10 mg by mouth every Monday, Wednesday, and Friday.)   sucralfate (CARAFATE) 1 g tablet Take 1 g by mouth daily as needed (GI sympotms).    venlafaxine XR (EFFEXOR-XR) 150 MG 24 hr capsule TAKE 1 CAPSULE BY MOUTH  DAILY WITH BREAKFAST   vitamin B-12 (CYANOCOBALAMIN) 1000 MCG tablet Take 1,000 mcg by mouth daily.   [DISCONTINUED] clobetasol cream (TEMOVATE) 5.46 % Apply 1 application topically 2 (two) times daily.   [DISCONTINUED] melatonin 3 MG TABS tablet Take 3 mg by mouth at bedtime.   [DISCONTINUED] Peppermint Oil (IBGARD) 90 MG CPCR Take by mouth.   No facility-administered encounter medications on file as of 09/25/2021.     Lab Results  Component Value Date   WBC 8.0 06/17/2021   HGB 12.1 06/17/2021   HCT 39.7 06/17/2021   PLT 419 (H) 06/17/2021   GLUCOSE 100 (H) 06/17/2021   CHOL 205 (H) 05/18/2021   TRIG 198.0 (H) 05/18/2021   HDL 58.60 05/18/2021   LDLDIRECT 172.8 09/17/2013   LDLCALC 107 (H) 05/18/2021   ALT 15 06/17/2021   AST 17 06/17/2021   NA 141 06/17/2021   K 4.2 06/17/2021   CL 106 06/17/2021   CREATININE 0.97 06/17/2021   BUN 11 06/17/2021   CO2 29 06/17/2021   TSH 2.06 12/10/2020   INR 1.0 06/11/2019   HGBA1C 6.0 05/18/2021   MICROALBUR 1.1 04/28/2015       Assessment & Plan:   Problem List Items Addressed This Visit     Aortic atherosclerosis ([place])    Continue crestor.       Diastolic heart failure (HCC)    History of diastolic dysfunction.  EF 60-65%.  No increased sob reported.  Follow. Continue metoprolol.       Dysuria - Primary    Reported at the end of visit, has noticed dysuria.  Check urine to confirm no infection.       Relevant Orders   Urinalysis, Routine w reflex microscopic (Completed)   Urine  Culture   Essential hypertension    Continue toprol.  Blood pressure doing well.  Follow blood pressure.  Follow metabolic panel.       Gastroesophageal reflux disease    Continue aciphex.        Persistent cough    Recent covid.  Persistent cough.  Saline nasal spray and steroid nasal spray as directed.  Continue robitussin.  Prednisone taper as directed.  Start using inhalers - discussed symbicort daily and albuterol prn.  Check cxr.  Follow.        Relevant Orders   DG Chest 2 View   PSVT (paroxysmal supraventricular tachycardia) ([place])    Has a history of PSVT.  Has seen Dr  Arida.  On metoprolol.  No increased heart rate or palpitations reported.  Continue metoprolol.        Rib pain    Persistent rib pain as outlined.  No pain with deep breathing.  Reproducible pain to palpation - lower right anterior rib.  Check xray.        Relevant Orders   DG Ribs Unilateral Right   Stenosis of vertebral artery without cerebral infarction    Continue aspirin and statin.        Throat irritation    Nystatin swish and spit tid.  Follow. Notify if does not resolve.         Einar Pheasant, MD

## 2021-09-26 ENCOUNTER — Encounter: Payer: Self-pay | Admitting: Internal Medicine

## 2021-09-26 DIAGNOSIS — J392 Other diseases of pharynx: Secondary | ICD-10-CM | POA: Insufficient documentation

## 2021-09-26 DIAGNOSIS — R300 Dysuria: Secondary | ICD-10-CM | POA: Insufficient documentation

## 2021-09-26 MED ORDER — NYSTATIN 100000 UNIT/ML MT SUSP: OROMUCOSAL | 0 refills | Status: DC

## 2021-09-26 NOTE — Assessment & Plan Note (Signed)
Has a history of PSVT.  Has seen Dr Arida.  On metoprolol.  No increased heart rate or palpitations reported.  Continue metoprolol.   

## 2021-09-26 NOTE — Assessment & Plan Note (Signed)
Continue toprol.  Blood pressure doing well.  Follow blood pressure.  Follow metabolic panel.  

## 2021-09-26 NOTE — Assessment & Plan Note (Signed)
Persistent rib pain as outlined.  No pain with deep breathing.  Reproducible pain to palpation - lower right anterior rib.  Check xray.

## 2021-09-26 NOTE — Assessment & Plan Note (Signed)
Continue crestor 

## 2021-09-26 NOTE — Addendum Note (Signed)
Addended by: Alisa Graff on: 09/26/2021 01:01 PM   Modules accepted: Orders

## 2021-09-26 NOTE — Assessment & Plan Note (Signed)
Continue aciphex.  

## 2021-09-26 NOTE — Assessment & Plan Note (Signed)
Continue aspirin and statin. 

## 2021-09-26 NOTE — Assessment & Plan Note (Signed)
Recent covid.  Persistent cough.  Saline nasal spray and steroid nasal spray as directed.  Continue robitussin.  Prednisone taper as directed.  Start using inhalers - discussed symbicort daily and albuterol prn.  Check cxr.  Follow.

## 2021-09-26 NOTE — Assessment & Plan Note (Signed)
History of diastolic dysfunction.  EF 60-65%.  No increased sob reported.  Follow. Continue metoprolol.  

## 2021-09-26 NOTE — Assessment & Plan Note (Signed)
Nystatin swish and spit tid.  Follow. Notify if does not resolve.

## 2021-09-26 NOTE — Assessment & Plan Note (Signed)
Reported at the end of visit, has noticed dysuria.  Check urine to confirm no infection.

## 2021-09-27 LAB — URINE CULTURE
MICRO NUMBER:: 12993385
SPECIMEN QUALITY:: ADEQUATE

## 2021-09-28 ENCOUNTER — Other Ambulatory Visit: Payer: Self-pay

## 2021-09-28 ENCOUNTER — Emergency Department: Payer: Medicare Other

## 2021-09-28 ENCOUNTER — Emergency Department
Admission: EM | Admit: 2021-09-28 | Discharge: 2021-09-28 | Disposition: A | Discharge status: Home / Self Care | Payer: Medicare Other | Attending: Emergency Medicine | Admitting: Emergency Medicine

## 2021-09-28 DIAGNOSIS — R0602 Shortness of breath: Secondary | ICD-10-CM

## 2021-09-28 DIAGNOSIS — R079 Chest pain, unspecified: Secondary | ICD-10-CM | POA: Diagnosis not present

## 2021-09-28 DIAGNOSIS — R0789 Other chest pain: Secondary | ICD-10-CM | POA: Diagnosis not present

## 2021-09-28 DIAGNOSIS — R059 Cough, unspecified: Secondary | ICD-10-CM | POA: Insufficient documentation

## 2021-09-28 DIAGNOSIS — Z8616 Personal history of COVID-19: Secondary | ICD-10-CM | POA: Insufficient documentation

## 2021-09-28 DIAGNOSIS — N189 Chronic kidney disease, unspecified: Secondary | ICD-10-CM | POA: Diagnosis not present

## 2021-09-28 DIAGNOSIS — I5030 Unspecified diastolic (congestive) heart failure: Secondary | ICD-10-CM | POA: Diagnosis not present

## 2021-09-28 LAB — CBC
HCT: 38.8 % (ref 36.0–46.0)
Hemoglobin: 12 g/dL (ref 12.0–15.0)
MCH: 28.5 pg (ref 26.0–34.0)
MCHC: 30.9 g/dL (ref 30.0–36.0)
MCV: 92.2 fL (ref 80.0–100.0)
Platelets: 453 10*3/uL — ABNORMAL HIGH (ref 150–400)
RBC: 4.21 MIL/uL (ref 3.87–5.11)
RDW: 13.4 % (ref 11.5–15.5)
WBC: 9.8 10*3/uL (ref 4.0–10.5)
nRBC: 0 % (ref 0.0–0.2)

## 2021-09-28 LAB — BASIC METABOLIC PANEL
Anion gap: 6 (ref 5–15)
BUN: 17 mg/dL (ref 8–23)
CO2: 26 mmol/L (ref 22–32)
Calcium: 8.7 mg/dL — ABNORMAL LOW (ref 8.9–10.3)
Chloride: 107 mmol/L (ref 98–111)
Creatinine, Ser: 0.97 mg/dL (ref 0.44–1.00)
GFR, Estimated: >60 mL/min (ref >60)
Glucose, Bld: 109 mg/dL — ABNORMAL HIGH (ref 70–99)
Potassium: 3.9 mmol/L (ref 3.5–5.1)
Sodium: 139 mmol/L (ref 135–145)

## 2021-09-28 LAB — TROPONIN I (HIGH SENSITIVITY)
Troponin I (High Sensitivity): 4 ng/L (ref <18)
Troponin I (High Sensitivity): 5 ng/L (ref <18)

## 2021-09-28 MED ORDER — IOHEXOL 350 MG/ML SOLN
75.0000 mL | Freq: Once | INTRAVENOUS | Status: AC | PRN
  Administered 2021-09-28: 75 mL via INTRAVENOUS
  Filled 2021-09-28: qty 75

## 2021-09-28 MED ORDER — NITROFURANTOIN MONOHYD MACRO 100 MG PO CAPS: 100.0000 mg | ORAL_CAPSULE | Freq: Two times a day (BID) | ORAL | 0 refills | Status: DC

## 2021-09-28 MED ORDER — AZITHROMYCIN 250 MG PO TABS: ORAL_TABLET | ORAL | 0 refills | Status: DC

## 2021-09-28 MED ORDER — AZITHROMYCIN 500 MG PO TABS
500.0000 mg | ORAL_TABLET | Freq: Once | ORAL | Status: AC
  Administered 2021-09-28: 500 mg via ORAL
  Filled 2021-09-28: qty 1

## 2021-09-28 MED ORDER — ALBUTEROL SULFATE HFA 108 (90 BASE) MCG/ACT IN AERS: 2.0000 | INHALATION_SPRAY | RESPIRATORY_TRACT | 0 refills | Status: AC | PRN

## 2021-09-28 NOTE — ED Provider Notes (Signed)
[facility] Provider Note  Patient Contact: 8:24 PM (approximate)   History   Shortness of Breath   HPI  Nicole Sims is a 71 y.o. female who presents the emergency department complaining of intermittent chest pain and shortness of breath.  Patient states that she had COVID 3 weeks ago, has had shortness of breath especially with laying down for the past several days.  Intermittent pain is more in the right chest.  No fevers, chills, nasal congestion, sore throat.  She does still have a cough.  She states that she has had similar episodes in the past and on review it does appear that over the past several years she has had intermittent chest pain and shortness of breath with reassuring work-ups.  She states that she also has "breathing problems" for which she takes [place].  Patient has a documented history of GERD, venous insufficiency in the lower extremities, diastolic heart failure, sleep apnea, atherosclerosis, chronic kidney disease, paroxysmal SVT.     Physical Exam   Triage Vital Signs: ED Triage Vitals  Enc Vitals Group     BP 09/28/21 1725 (!) 173/74     Pulse Rate 09/28/21 1725 64     Resp 09/28/21 1725 (!) 25     Temp 09/28/21 1725 98.7 F (37.1 C)     Temp Source 09/28/21 1725 Oral     SpO2 09/28/21 1725 93 %     Weight 09/28/21 1726 233 lb (105.7 kg)     Height 09/28/21 1726 5\' 5"  (1.651 m)     Head Circumference --      Peak Flow --      Pain Score 09/28/21 1725 3     Pain Loc --      Pain Edu? --      Excl. in [place]? --     Most recent vital signs: Vitals:   09/28/21 1945 09/28/21 2309  BP: (!) 164/82 (!) 162/75  Pulse: (!) 59 60  Resp: 20 20  Temp:    SpO2: 100% 98%     General: Alert and in no acute distress. ENT:      Ears:       Nose: No congestion/rhinnorhea.      Mouth/Throat: Mucous membranes are moist. Neck: No stridor. No cervical spine tenderness to palpation.  Cardiovascular:  Good peripheral perfusion.  No gross  peripheral edema.  No murmurs, rubs, gallops Respiratory: Normal respiratory effort without tachypnea or retractions. Lungs CTAB. Good air entry to the bases with no decreased or absent breath sounds. Musculoskeletal: Full range of motion to all extremities.  Neurologic:  No gross focal neurologic deficits are appreciated.  Skin:   No rash noted Other:   ED Results / Procedures / Treatments   Labs (all labs ordered are listed, but only abnormal results are displayed) Labs Reviewed  BASIC METABOLIC PANEL - Abnormal; Notable for the following components:      Result Value   Glucose, Bld 109 (*)    Calcium 8.7 (*)    All other components within normal limits  CBC - Abnormal; Notable for the following components:   Platelets 453 (*)    All other components within normal limits  TROPONIN I (HIGH SENSITIVITY)  TROPONIN I (HIGH SENSITIVITY)     EKG  ED ECG REPORT I, Charline Bills Lezley Bedgood,  personally viewed and interpreted this ECG.   Date: 09/28/2021  EKG Time: 1725 hrs.  Rate: 61 bpm  Rhythm: unchanged from previous  tracings, normal sinus rhythm, no significant change from 09/23/2020  Axis: Normal axis  Intervals:none  ST&T Change: No ST elevation or depression noted.  No STEMI.  Normal sinus rhythm.  No STEMI.  No significant changes from previous EKG from 09/23/2020   RADIOLOGY  I personally viewed and evaluated these images as part of my medical decision making, as well as reviewing the written report by the radiologist.  ED Provider Interpretation: No acute cardiopulmonary findings on chest x-ray.  CT scan of the chest reveals no acute findings specifically no PE  CT Angio Chest PE W and/or Wo Contrast  Result Date: 09/28/2021 CLINICAL DATA:  Shortness of breath. EXAM: CT ANGIOGRAPHY CHEST WITH CONTRAST TECHNIQUE: Multidetector CT imaging of the chest was performed using the standard protocol during bolus administration of intravenous contrast. Multiplanar CT image  reconstructions and MIPs were obtained to evaluate the vascular anatomy. RADIATION DOSE REDUCTION: This exam was performed according to the departmental dose-optimization program which includes automated exposure control, adjustment of the mA and/or kV according to patient size and/or use of iterative reconstruction technique. CONTRAST:  75mL OMNIPAQUE IOHEXOL 350 MG/ML SOLN COMPARISON:  01/22/2019 FINDINGS: Cardiovascular: No filling defects in the pulmonary arteries to suggest pulmonary emboli. Heart borderline in size. Aorta normal caliber. Scattered aortic calcifications. Mediastinum/Nodes: No mediastinal, hilar, or axillary adenopathy. Trachea and esophagus are unremarkable. Thyroid unremarkable. Lungs/Pleura: Lungs are clear. No focal airspace opacities or suspicious nodules. No effusions. Upper Abdomen: Imaging into the upper abdomen demonstrates no acute findings. Musculoskeletal: Chest [place] soft tissues are unremarkable. No acute bony abnormality. Review of the MIP images confirms the above findings. IMPRESSION: No evidence of pulmonary embolus. No acute cardiopulmonary disease. Aortic Atherosclerosis (ICD10-I70.0). Electronically Signed   By: Rolm Baptise M.D.   On: 09/28/2021 22:26    PROCEDURES:  Critical Care performed: No  Procedures   MEDICATIONS ORDERED IN ED: Medications  iohexol (OMNIPAQUE) 350 MG/ML injection 75 mL (75 mLs Intravenous Contrast Given 09/28/21 2206)  azithromycin (ZITHROMAX) tablet 500 mg (500 mg Oral Given 09/28/21 2309)     IMPRESSION / MDM / ASSESSMENT AND PLAN / ED COURSE  I reviewed the triage vital signs and the nursing notes.                              Differential diagnosis includes, but is not limited to, post viral pneumonia, influenza, bronchitis, STEMI, NSTEMI, PE   Patient's diagnosis is consistent with nonspecific chest pain or shortness of breath.  Patient presented to the emergency department with intermittent chest pain and intermittent  shortness of breath.  Patient is have symptoms for several days.  Evidently patient has had similar episodes in the past but she was primarily concerned as she just got over [place].  No fevers, no nasal congestion, sore throat.  She does still have a cough.  Chest pain was not reproducible on palpation.  Patient CBC, CMP are reassuring.  Troponin is reassuring at this time.  Initial chest x-ray without concerns.  Given the recent COVID infection even though patient did not have tachycardia or true pleuritic pain I felt that a PE chest was warranted.  Imaging reveals no evidence of PE.  At this time patient will be treated for post viral pneumonia as she is still coughing and having the shortness of breath.  I have recommended that she follow-up with her cardiologist.  Strict return precautions discussed with the patient.. Patient is given ED  precautions to return to the ED for any worsening or new symptoms.        FINAL CLINICAL IMPRESSION(S) / ED DIAGNOSES   Final diagnoses:  Nonspecific chest pain  Shortness of breath     Rx / DC Orders   ED Discharge Orders          Ordered    azithromycin (ZITHROMAX Z-PAK) 250 MG tablet        09/28/21 2309    albuterol (VENTOLIN HFA) 108 (90 Base) MCG/ACT inhaler  Every 4 hours PRN        09/28/21 2309             Note:  This document was prepared using Dragon voice recognition software and may include unintentional dictation errors.   Brynda Peon 09/28/21 2318    Carrie Mew, MD 09/29/21 [phone]

## 2021-09-28 NOTE — ED Triage Notes (Signed)
Patient to ER via POV with complaints of shortness of breath since Friday. Reports shortness of breath is worse at night time and is accompanied with centralized non-radiating chest pain when lying down. Reports having covid three weeks ago. Reports she has had some drainage/ productive cough.   Also has complaints of rib pain, recently had negative x-rays.

## 2021-10-01 ENCOUNTER — Telehealth: Payer: Self-pay | Admitting: Cardiovascular Disease

## 2021-10-01 NOTE — Telephone Encounter (Signed)
Pt c/o of Chest Pain: STAT if CP now or developed within 24 hours  1. Are you having CP right now? Yes   2. Are you experiencing any other symptoms (ex. SOB, nausea, vomiting, sweating)? SOBE nausea shoulder pain   3. How long have you been experiencing CP? Since Covid + 3 weeks ago   4. Is your CP continuous or coming and going? Comes and goes    5. Have you taken Nitroglycerin? No nitro tylenol helps a little bit  ?   Scheduled 2/17 at [facility]

## 2021-10-01 NOTE — Telephone Encounter (Signed)
Spoke with the patient. Patient denies chest pain currently. She sts that she is having left shoulder pain. Her sob is stable. She has had intermittent nausea for a couple of days. Pt denies palpitations. She has a hx of psvt. Her HRs have been stable. She has not taken tylenol for the left should pain. She sts that it is not that bad and she does not need pain relief. Nothing makes thus discomfort better or worse.   Patient tested positive for COVID 3 weeks ago. She is currently being treated for pneumonia with antibiotics. Patient was evaluated in the ED 3 days ago. Troponin was negative x2. EKG showed Normal sinus rhythm.  No STEMI.  No significant changes from previous EKG from 09/23/2020. CT to r/o PE negative.  Patient has been scheduled with Cadence Kathlen Mody, [place] tomorrow 10/02/21. Adv the patient to keep that appt. Adv the patient that if symptoms worsen in the interim she is to return to the ED for evaluation. Patient verbalized understanding and voiced appreciation for the call.

## 2021-10-01 NOTE — Progress Notes (Addendum)
Office Visit    Patient Name: Nicole Sims Date of Encounter: 10/02/2021  PCP:  Einar Pheasant, [place]  Cardiologist:  Kathlyn Sacramento, MD  Advanced Practice Provider:  No care team member to display Electrophysiologist:  None   Chief Complaint    Nicole Sims is a 71 y.o. female with a hx of chronic diastolic heart failure, paroxysmal SVT, hyperlipidemia, hypertension, sleep apnea on CPAP, and obesity presents today for follow-up appointment.  She was last seen February 2022 and is being evaluated for exertional dyspnea.  A right and left heart catheterization was done August 2019 which showed normal coronaries and normal LV systolic function.  Right heart catheterization showed mildly elevated filling pressures with pulmonary capillary wedge pressure of 13 mmHg, no pulmonary hypertension and normal CO.  CTA of the chest showed no evidence of PE.  She was previously on monitor for palpitations which showed normal sinus rhythm.  She had a short run of SVT, the longest lasting 7 beats.  Her dose of Toprol was increased to 100 mg with significant improvement.  She was last seen she was doing reasonably well.  Palpitations seem well controlled.  She is still having some problems with her CPAP machine.  Shortness of breath was stable.  She was in the emergency department a few days ago with intermittent chest pain and shortness of breath.  She recently had COVID-19 a few weeks ago and had shortness of breath especially when laying down.  The intermittent chest pain is more in her right chest.  CT angio of the chest was ordered and negative for pulmonary embolism.  Today, she still is having some exertional SOB  Past Medical History    Past Medical History:  Diagnosis Date   Anemia    Anginal pain (HCC)    Anxiety    Asthma    Cataract cortical, senile    CHF (congestive heart failure) (HCC)    Chronic headaches    Diastolic heart failure (HCC)     Diverticulosis    Environmental allergies    GERD (gastroesophageal reflux disease)    Heart murmur    Hyperglycemia    Hyperlipidemia    Hypertension    Leukocytosis    Obesity    Osteoarthritis    Palpitations    Restless leg syndrome    Sleep apnea    Thrombocytosis    Urinary incontinence    mixed   Venous insufficiency    Vitamin D deficiency    Wears dentures    partial upper   Past Surgical History:  Procedure Laterality Date   BLADDER SURGERY     x2   [facility]   BREAST CYST ASPIRATION Bilateral 2005   approximate year   CARDIAC CATHETERIZATION     [place]   CATARACT EXTRACTION W/PHACO Right 04/24/2020   Procedure: CATARACT EXTRACTION PHACO AND INTRAOCULAR LENS PLACEMENT ([place]) RIGHT;  Surgeon: Birder Robson, MD;  Location: [facility];  Service: Ophthalmology;  Laterality: Right;  6.75 0:37.3   CATARACT EXTRACTION W/PHACO Left 01/27/2021   Procedure: CATARACT EXTRACTION PHACO AND INTRAOCULAR LENS PLACEMENT ([place]) LEFT;  Surgeon: Birder Robson, MD;  Location: [facility];  Service: Ophthalmology;  Laterality: Left;  6.75 00:43.9   CERVICAL CONE BIOPSY     CIS   CHOLECYSTECTOMY     COLONOSCOPY WITH PROPOFOL N/A 07/19/2016   Procedure: COLONOSCOPY WITH PROPOFOL;  Surgeon: Manya Silvas, MD;  Location: [facility]  ENDOSCOPY;  Service: Endoscopy;  Laterality: N/A;   COLONOSCOPY WITH PROPOFOL N/A 10/20/2018   Procedure: COLONOSCOPY WITH PROPOFOL;  Surgeon: Lollie Sails, MD;  Location: [facility] ENDOSCOPY;  Service: Endoscopy;  Laterality: N/A;   ESOPHAGOGASTRODUODENOSCOPY N/A 05/12/2021   Procedure: ESOPHAGOGASTRODUODENOSCOPY (EGD);  Surgeon: Lesly Rubenstein, MD;  Location: [facility] ENDOSCOPY;  Service: Endoscopy;  Laterality: N/A;   ESOPHAGOGASTRODUODENOSCOPY (EGD) WITH PROPOFOL N/A 07/19/2016   Procedure: ESOPHAGOGASTRODUODENOSCOPY (EGD) WITH PROPOFOL;  Surgeon: Manya Silvas, MD;  Location: [facility] ENDOSCOPY;  Service: Endoscopy;   Laterality: N/A;   FLEXIBLE SIGMOIDOSCOPY     KNEE ARTHROSCOPY  08/13/08   knee replacement and revision     left   RECTOCELE REPAIR     RIGHT/LEFT HEART CATH AND CORONARY ANGIOGRAPHY Bilateral 04/10/2018   Procedure: RIGHT/LEFT HEART CATH AND CORONARY ANGIOGRAPHY;  Surgeon: Wellington Hampshire, MD;  Location: [facility];  Service: Cardiovascular;  Laterality: Bilateral;   ROTATOR CUFF REPAIR     bilateral   SHOULDER SURGERY  11/17/05   TONSILLECTOMY  1962   VAGINAL HYSTERECTOMY  1974   abnormal pap and carcinoma in situ    Allergies  Allergies  Allergen Reactions   Levaquin [Levofloxacin]    Augmentin [Amoxicillin-Pot Clavulanate] Other (See Comments)    Questionable itching   Bactrim [Sulfamethoxazole-Trimethoprim] Rash   Doxycycline Itching   Hydrocodone-Acetaminophen Other (See Comments)    GI distress   Pseudoephedrine Rash    Itching of the scalp   Shellfish Allergy Nausea And Vomiting    Nausea and vomiting     EKGs/Labs/Other Studies Reviewed:   The following studies were reviewed today:  CT Angio 09/28/2021  IMPRESSION: No evidence of pulmonary embolus.   No acute cardiopulmonary disease.   Aortic Atherosclerosis (ICD10-I70.0).     Electronically Signed   By: Rolm Baptise M.D.   On: 09/28/2021 22:26  EKG:  EKG is  ordered today.  The ekg ordered today demonstrates NSR of 63 bpm  Recent Labs: 12/10/2020: TSH 2.06 06/17/2021: ALT 15 09/28/2021: BUN 17; Creatinine, Ser 0.97; Hemoglobin 12.0; Platelets 453; Potassium 3.9; Sodium 139  Recent Lipid Panel    Component Value Date/Time   CHOL 205 (H) 05/18/2021 1209   TRIG 198.0 (H) 05/18/2021 1209   HDL 58.60 05/18/2021 1209   CHOLHDL 3 05/18/2021 1209   VLDL 39.6 05/18/2021 1209   LDLCALC 107 (H) 05/18/2021 1209   LDLDIRECT 172.8 09/17/2013 0950     Home Medications   Current Meds  Medication Sig   acetaminophen (TYLENOL) 500 MG tablet Take 500 mg by mouth every 6 (six) hours as needed.    AIMOVIG 140 MG/ML SOAJ 140 Milligram(s) SUB-Q Once a Month   albuterol (VENTOLIN HFA) 108 (90 Base) MCG/ACT inhaler Inhale 2 puffs into the lungs every 4 (four) hours as needed for wheezing or shortness of breath.   aspirin 325 MG tablet Take 325 mg by mouth daily.   azithromycin (ZITHROMAX Z-PAK) 250 MG tablet Take 2 tablets (500 mg) on  Day 1,  followed by 1 tablet (250 mg) once daily on Days 2 through 5.   baclofen (LIORESAL) 10 MG tablet Take 10 mg by mouth 3 (three) times daily as needed for muscle spasms.   budesonide-formoterol (SYMBICORT) 80-4.5 MCG/ACT inhaler Inhale 2 puffs into the lungs 2 (two) times daily.   Cholecalciferol (VITAMIN D-3) 1000 units CAPS Take 1,000 Units by mouth daily.   Evolocumab (REPATHA) 140 MG/ML SOSY Inject 140 mg into the skin every 14 (fourteen)  days.   fluticasone (FLONASE) 50 MCG/ACT nasal spray Place 2 sprays into both nostrils daily.   furosemide (LASIX) 20 MG tablet Take 1 tablet (20 mg) by mouth once daily as needed for weight gain of 3 lbs or more overnight   metoprolol succinate (TOPROL-XL) 100 MG 24 hr tablet TAKE 1 TABLET BY MOUTH  DAILY WITH OR IMMEDIATLEY  FOLLOWING A MEAL   montelukast (SINGULAIR) 10 MG tablet TAKE 1 TABLET BY MOUTH AT  BEDTIME   nitrofurantoin, macrocrystal-monohydrate, (MACROBID) 100 MG capsule Take 1 capsule (100 mg total) by mouth 2 (two) times daily.   nystatin (MYCOSTATIN) 100000 UNIT/ML suspension 5 mls - swish and spit tid.   Omega-3 Fatty Acids (FISH OIL) 1000 MG CAPS Take 1,000 mg by mouth.   ondansetron (ZOFRAN) 4 MG tablet Take 1 tablet (4 mg total) by mouth 2 (two) times daily as needed for nausea or vomiting.   potassium chloride (KLOR-CON) 10 MEQ tablet Take 1 tablet (10 meq) by mouth once daily as needed only when taking lasix (furosemide)   predniSONE (DELTASONE) 10 MG tablet Take 4 tablets x 1 day and then decrease by 1/2 tablet per day until down to zero mg.   pregabalin (LYRICA) 50 MG capsule Take by mouth. Take  1 capsule by mouth in the morning and 2 capsules at night.   RABEprazole (ACIPHEX) 20 MG tablet Take 20 mg by mouth 2 (two) times daily.   sucralfate (CARAFATE) 1 g tablet Take 1 g by mouth daily as needed (GI sympotms).    venlafaxine XR (EFFEXOR-XR) 150 MG 24 hr capsule TAKE 1 CAPSULE BY MOUTH  DAILY WITH BREAKFAST   vitamin B-12 (CYANOCOBALAMIN) 1000 MCG tablet Take 1,000 mcg by mouth daily.   [DISCONTINUED] rosuvastatin (CRESTOR) 10 MG tablet TAKE 1 TABLET BY MOUTH  DAILY (Patient taking differently: Take 10 mg by mouth every Monday, Wednesday, and Friday.)     Review of Systems      All other systems reviewed and are otherwise negative except as noted above.  Physical Exam    VS:  BP 120/78 (BP Location: Left Arm, Patient Position: Sitting, Cuff Size: Large)    Pulse 63    Ht 5\' 5"  (1.651 m)    Wt 234 lb (106.1 kg)    SpO2 98%    BMI 38.94 kg/m  , BMI Body mass index is 38.94 kg/m.  Wt Readings from Last 3 Encounters:  10/02/21 234 lb (106.1 kg)  09/28/21 233 lb (105.7 kg)  09/25/21 233 lb (105.7 kg)     GEN: Well nourished, well developed, in no acute distress. HEENT: normal. Neck: Supple, no JVD, carotid bruits, or masses. Cardiac: RRR, no murmurs, rubs, or gallops. No clubbing, cyanosis, edema.  Radials/PT 2+ and equal bilaterally.  Respiratory:  Respirations regular and unlabored, clear to auscultation bilaterally. GI: Soft, nontender, nondistended. MS: No deformity or atrophy. Skin: Warm and dry, no rash. Neuro:  Strength and sensation are intact. Psych: Normal affect.  Assessment & Plan    Paroxysmal SVT -No recent symptoms -Well controlled on BB therapy  Chronic diastolic heart failure -Last echocardiogram showed normal EF, Grade 1 DD -Will repeat an Echocardiogram for DOE -Euvolemic on exam -Continue GDMT: metoprolol, lasix, fish oil, asa, Repatha  Hypertension -Well controlled today in the clinic -Encouraged to continue taking at home -Continue current  medication regimen  Hyperlipidemia -LDL above goal at 107, triglycerides  -Will discontinue Crestor since she has memory issues with it and start her on  Repatha.   OSA -compliant with CPAP  Recent COVID 19 infection -she is still struggling with symptoms  -She is on two antibiotics and steroids -She was also started on inhaled medications PRN   Disposition: Follow up 2 months with Kathlyn Sacramento, MD or APP.  Signed, Elgie Collard, PA-C 10/02/2021, 9:35 AM [facility]

## 2021-10-02 ENCOUNTER — Ambulatory Visit: Payer: Medicare Other | Admitting: Physician Assistant

## 2021-10-02 ENCOUNTER — Other Ambulatory Visit: Payer: Self-pay

## 2021-10-02 ENCOUNTER — Encounter: Payer: Self-pay | Admitting: Medical

## 2021-10-02 VITALS — BP 120/78 | HR 63 | Ht 65.0 in | Wt 234.0 lb

## 2021-10-02 DIAGNOSIS — E782 Mixed hyperlipidemia: Secondary | ICD-10-CM | POA: Diagnosis not present

## 2021-10-02 DIAGNOSIS — I5189 Other ill-defined heart diseases: Secondary | ICD-10-CM | POA: Diagnosis not present

## 2021-10-02 DIAGNOSIS — Z9989 Dependence on other enabling machines and devices: Secondary | ICD-10-CM | POA: Diagnosis not present

## 2021-10-02 DIAGNOSIS — R0602 Shortness of breath: Secondary | ICD-10-CM

## 2021-10-02 DIAGNOSIS — G4733 Obstructive sleep apnea (adult) (pediatric): Secondary | ICD-10-CM | POA: Diagnosis not present

## 2021-10-02 DIAGNOSIS — I10 Essential (primary) hypertension: Secondary | ICD-10-CM | POA: Diagnosis not present

## 2021-10-02 MED ORDER — REPATHA 140 MG/ML ~~LOC~~ SOSY: 140.0000 mg | PREFILLED_SYRINGE | SUBCUTANEOUS | 3 refills | Status: DC

## 2021-10-02 NOTE — Patient Instructions (Signed)
Medication Instructions:  Your physician has recommended you make the following change in your medication:   STOP taking Crestor  START taking evolocumab (Repatha) 140 mg every 14 days. This will require a pre-authorization which our office will complete  *If you need a refill on your cardiac medications before your next appointment, please call your pharmacy*   Lab Work: Your physician recommends that you return for a FASTING lipid profile: The week prior to your 8 week follow up.  - You will need to be fasting. Please do not have anything to eat or drink after midnight the morning you have the lab work. You may only have water or black coffee with no cream or sugar.    If you have labs (blood work) drawn today and your tests are completely normal, you will receive your results only by: [place] (if you have MyChart) OR A paper copy in the mail If you have any lab test that is abnormal or we need to change your treatment, we will call you to review the results.   Testing/Procedures: Echocardiogram - Your physician has requested that you have an echocardiogram. Echocardiography is a painless test that uses sound waves to create images of your heart. It provides your doctor with information about the size and shape of your heart and how well your hearts chambers and valves are working. This procedure takes approximately one hour. There are no restrictions for this procedure.     Follow-Up: At [facility], you and your health needs are our priority.  As part of our continuing mission to provide you with exceptional heart care, we have created designated Provider Care Teams.  These Care Teams include your primary Cardiologist (physician) and Advanced Practice Providers (APPs -  Physician Assistants and Nurse Practitioners) who all work together to provide you with the care you need, when you need it.  We recommend signing up for the patient portal called "MyChart".  Sign up  information is provided on this After Visit Summary.  MyChart is used to connect with patients for Virtual Visits (Telemedicine).  Patients are able to view lab/test results, encounter notes, upcoming appointments, etc.  Non-urgent messages can be sent to your provider as well.   To learn more about what you can do with MyChart, go to [email or web address].    Your next appointment:   2 month(s)  The format for your next appointment:   In Person  Provider:   You may see Kathlyn Sacramento, MD or one of the following Advanced Practice Providers on your designated Care Team:   Murray Hodgkins, NP Christell Faith, PA-C Cadence Kathlen Mody, PA-C :1}    Other Instructions N/A

## 2021-10-09 ENCOUNTER — Encounter: Payer: Medicare Other | Admitting: Occupational Therapy

## 2021-10-13 ENCOUNTER — Telehealth: Payer: Self-pay | Admitting: Physician Assistant

## 2021-10-13 DIAGNOSIS — E782 Mixed hyperlipidemia: Secondary | ICD-10-CM

## 2021-10-13 NOTE — Telephone Encounter (Signed)
[facility] prefers repatha. Routing back to tessa conte

## 2021-10-13 NOTE — Telephone Encounter (Signed)
Repatha   Pt c/o medication issue:  1. Name of Medication: Repatha   2. How are you currently taking this medication (dosage and times per day)? 140 mg/ml inj q 14 d   3. Are you having a reaction (difficulty breathing--STAT)? no  4. What is your medication issue? Not approved med this year

## 2021-10-14 ENCOUNTER — Other Ambulatory Visit: Payer: Self-pay | Admitting: Cardiovascular Disease

## 2021-10-14 NOTE — Telephone Encounter (Signed)
I am not sure if this is for me? I do not see anything about a pre op clearance. Is this just something about repatha? ? ?

## 2021-10-14 NOTE — Telephone Encounter (Signed)
It still requires a pa all pcsk9s do. I complete them for pharmds and would complete this one for you I just need orders like: ? ?complete pa for repatha 140mg  q2w and post 4 dose follow up  fasting labs of lft and flp.  ? ?Or you can just respond with agreed to this.  ?

## 2021-10-14 NOTE — Telephone Encounter (Signed)
Called and lvm that they were approved for repatha, rx sent, instructed the pt that they needed to complete post 4th dose labs and call if unaffordable.  ?

## 2021-10-14 NOTE — Telephone Encounter (Signed)
Pa sent for repatha: ?Nicole Sims (Key: [id number]) ?Repatha SureClick 140MG /ML auto-injectors ?  ?Form ?[employer] Medicare Part D Electronic Prior Authorization Form (2017 NCPDP) ? ?Lipid/hepatic panel ordered and released. ? ?Will contact pt once determination is made ?

## 2021-10-14 NOTE — Addendum Note (Signed)
Addended by: Allean Found on: 10/14/2021 12:00 PM ? ? Modules accepted: Orders ? ?

## 2021-10-28 ENCOUNTER — Other Ambulatory Visit: Payer: Self-pay

## 2021-10-28 ENCOUNTER — Ambulatory Visit (INDEPENDENT_AMBULATORY_CARE_PROVIDER_SITE_OTHER): Payer: Medicare Other

## 2021-10-28 DIAGNOSIS — R0602 Shortness of breath: Secondary | ICD-10-CM

## 2021-10-29 LAB — ECHOCARDIOGRAM COMPLETE
AR max vel: 2.55 cm2
AV Area VTI: 2.47 cm2
AV Area mean vel: 2.43 cm2
AV Mean grad: 6 mmHg
AV Peak grad: 10.9 mmHg
Ao pk vel: 1.65 m/s
Area-P 1/2: 3.27 cm2
Calc EF: 72.9 %
S' Lateral: 2.6 cm
Single Plane A2C EF: 77.4 %
Single Plane A4C EF: 69 %

## 2021-10-30 DIAGNOSIS — M3501 Sicca syndrome with keratoconjunctivitis: Secondary | ICD-10-CM | POA: Diagnosis not present

## 2021-11-02 ENCOUNTER — Telehealth: Payer: Self-pay | Admitting: Emergency Medicine

## 2021-11-02 NOTE — Telephone Encounter (Signed)
-----   Message from [place], PA-C sent at 11/02/2021  1:56 PM EDT ----- ?Echo showed LVEF 65-70%, no WMA, normal RV function. Overall stable from prior echo.  ?

## 2021-11-02 NOTE — Telephone Encounter (Signed)
Spoke with patient.  ? ?Reviewed results with patient, patient verbalized understanding.  ? ?Pt would like to know what to do if she has a "flare up" of bad breathing, since this doesn't seem to be her heart. Will send to Cadence Kathlen Mody, PA-C for advice.  ?

## 2021-11-02 NOTE — Telephone Encounter (Signed)
Patient calling to discuss recent testing results  ° °Please call  ° °

## 2021-11-02 NOTE — Telephone Encounter (Signed)
Called patient to go over results. No answer, lmtcb.  ?

## 2021-11-04 NOTE — Telephone Encounter (Signed)
Furth, Cadence H, PA-C  You 19 hours ago (2:00 PM)  ? ?I'm not sure exactly what that could be, deconditioning, asthma, lung etiology from [place]? Can see PCP for non-cardiac reasons. We can provide referral to pulmonary if she is interested.   ? ?Called and spoke with patient. Went over [employer] recommendations with her. Pt declined pulmonary referral at this time and states she will f/u with her PCP regarding her continued flare ups of difficulty breathing. Pt voiced appreciation for the call.  ?

## 2021-11-13 ENCOUNTER — Other Ambulatory Visit: Payer: Self-pay | Admitting: Medical

## 2021-11-17 ENCOUNTER — Other Ambulatory Visit (INDEPENDENT_AMBULATORY_CARE_PROVIDER_SITE_OTHER): Payer: Medicare Other

## 2021-11-17 ENCOUNTER — Other Ambulatory Visit: Payer: Self-pay | Admitting: *Deleted

## 2021-11-17 DIAGNOSIS — E782 Mixed hyperlipidemia: Secondary | ICD-10-CM

## 2021-11-18 LAB — HEPATIC FUNCTION PANEL
ALT: 14 IU/L (ref 0–32)
AST: 11 IU/L (ref 0–40)
Albumin: 4.2 g/dL (ref 3.7–4.7)
Alkaline Phosphatase: 93 IU/L (ref 44–121)
Bilirubin Total: 0.4 mg/dL (ref 0.0–1.2)
Bilirubin, Direct: 0.16 mg/dL (ref 0.00–0.40)
Total Protein: 6.9 g/dL (ref 6.0–8.5)

## 2021-11-18 LAB — LIPID PANEL
Chol/HDL Ratio: 2.4 ratio (ref 0.0–4.4)
Cholesterol, Total: 145 mg/dL (ref 100–199)
HDL: 60 mg/dL (ref >39)
LDL Chol Calc (NIH): 66 mg/dL (ref 0–99)
Triglycerides: 103 mg/dL (ref 0–149)
VLDL Cholesterol Cal: 19 mg/dL (ref 5–40)

## 2021-11-24 ENCOUNTER — Ambulatory Visit: Payer: Medicare Other | Admitting: Podiatry

## 2021-11-24 ENCOUNTER — Ambulatory Visit: Payer: Medicare Other | Admitting: Internal Medicine

## 2021-11-24 ENCOUNTER — Encounter: Payer: Self-pay | Admitting: Podiatry

## 2021-11-24 DIAGNOSIS — M9262 Juvenile osteochondrosis of tarsus, left ankle: Secondary | ICD-10-CM | POA: Diagnosis not present

## 2021-11-24 DIAGNOSIS — M21862 Other specified acquired deformities of left lower leg: Secondary | ICD-10-CM | POA: Diagnosis not present

## 2021-11-24 DIAGNOSIS — M7662 Achilles tendinitis, left leg: Secondary | ICD-10-CM

## 2021-11-24 DIAGNOSIS — M25561 Pain in right knee: Secondary | ICD-10-CM | POA: Diagnosis not present

## 2021-11-24 DIAGNOSIS — Z96651 Presence of right artificial knee joint: Secondary | ICD-10-CM | POA: Diagnosis not present

## 2021-11-24 NOTE — Progress Notes (Signed)
?Subjective:  ?Patient ID: Nicole Sims, female    DOB: Nov 26, 1950,  MRN: [id number] ? ?Chief Complaint  ?Patient presents with  ? Tendonitis  ?  Left achilles flare up again  ? ? ?71 y.o. female presents with the above complaint.  Patient presents now primarily with left Achilles tendinitis insertional pain.  She states that she was doing good but then her pain started back up again in 6 to 8 weeks.  She states been hurting in for a while.  The injection wore off.  She would like to know if she can do another injection and get an MRI.  She denies any other acute complaints.  Hurts with ambulation ? ?Review of Systems: Negative except as noted in the HPI. Denies N/V/F/Ch. ? ?Past Medical History:  ?Diagnosis Date  ? Anemia   ? Anginal pain ([place])   ? Anxiety   ? Asthma   ? Cataract cortical, senile   ? CHF (congestive heart failure) ([place])   ? Chronic headaches   ? Diastolic heart failure ([place])   ? Diverticulosis   ? Environmental allergies   ? GERD (gastroesophageal reflux disease)   ? Heart murmur   ? Hyperglycemia   ? Hyperlipidemia   ? Hypertension   ? Leukocytosis   ? Obesity   ? Osteoarthritis   ? Palpitations   ? Restless leg syndrome   ? Sleep apnea   ? Thrombocytosis   ? Urinary incontinence   ? mixed  ? Venous insufficiency   ? Vitamin D deficiency   ? Wears dentures   ? partial upper  ? ? ?Current Outpatient Medications:  ?  acetaminophen (TYLENOL) 500 MG tablet, Take 500 mg by mouth every 6 (six) hours as needed., Disp: , Rfl:  ?  AIMOVIG 140 MG/ML SOAJ, 140 Milligram(s) SUB-Q Once a Month, Disp: , Rfl:  ?  albuterol (VENTOLIN HFA) 108 (90 Base) MCG/ACT inhaler, Inhale 2 puffs into the lungs every 4 (four) hours as needed for wheezing or shortness of breath., Disp: 1 each, Rfl: 0 ?  aspirin 325 MG tablet, Take 325 mg by mouth daily., Disp: , Rfl:  ?  azithromycin (ZITHROMAX Z-PAK) 250 MG tablet, Take 2 tablets (500 mg) on  Day 1,  followed by 1 tablet (250 mg) once daily on Days 2 through 5., Disp: 6  each, Rfl: 0 ?  baclofen (LIORESAL) 10 MG tablet, Take 10 mg by mouth 3 (three) times daily as needed for muscle spasms., Disp: , Rfl:  ?  budesonide-formoterol (SYMBICORT) 80-4.5 MCG/ACT inhaler, Inhale 2 puffs into the lungs 2 (two) times daily., Disp: 1 each, Rfl: 0 ?  Cholecalciferol (VITAMIN D-3) 1000 units CAPS, Take 1,000 Units by mouth daily., Disp: , Rfl:  ?  fluticasone (FLONASE) 50 MCG/ACT nasal spray, Place 2 sprays into both nostrils daily., Disp: 48 g, Rfl: 3 ?  furosemide (LASIX) 20 MG tablet, Take 1 tablet (20 mg) by mouth once daily as needed for weight gain of 3 lbs or more overnight, Disp: , Rfl:  ?  linaclotide (LINZESS) 290 MCG CAPS capsule, Take 290 mcg by mouth daily before breakfast. (Patient not taking: Reported on 10/02/2021), Disp: , Rfl:  ?  metoprolol succinate (TOPROL-XL) 100 MG 24 hr tablet, TAKE 1 TABLET BY MOUTH  DAILY WITH OR IMMEDIATLEY  FOLLOWING A MEAL, Disp: 90 tablet, Rfl: 0 ?  montelukast (SINGULAIR) 10 MG tablet, TAKE 1 TABLET BY MOUTH AT  BEDTIME, Disp: 90 tablet, Rfl: 3 ?  nitrofurantoin, macrocrystal-monohydrate, (  MACROBID) 100 MG capsule, Take 1 capsule (100 mg total) by mouth 2 (two) times daily., Disp: 10 capsule, Rfl: 0 ?  nystatin (MYCOSTATIN) 100000 UNIT/ML suspension, 5 mls - swish and spit tid., Disp: 60 mL, Rfl: 0 ?  Omega-3 Fatty Acids (FISH OIL) 1000 MG CAPS, Take 1,000 mg by mouth., Disp: , Rfl:  ?  ondansetron (ZOFRAN) 4 MG tablet, Take 1 tablet (4 mg total) by mouth 2 (two) times daily as needed for nausea or vomiting., Disp: 15 tablet, Rfl: 0 ?  potassium chloride (KLOR-CON) 10 MEQ tablet, Take 1 tablet (10 meq) by mouth once daily as needed only when taking lasix (furosemide), Disp: , Rfl:  ?  predniSONE (DELTASONE) 10 MG tablet, Take 4 tablets x 1 day and then decrease by 1/2 tablet per day until down to zero mg., Disp: 18 tablet, Rfl: 0 ?  pregabalin (LYRICA) 50 MG capsule, Take by mouth. Take 1 capsule by mouth in the morning and 2 capsules at night.,  Disp: , Rfl:  ?  RABEprazole (ACIPHEX) 20 MG tablet, Take 20 mg by mouth 2 (two) times daily., Disp: , Rfl:  ?  REPATHA 140 MG/ML SOSY, INJECT 140 MG UNDER THE SKIN EVERY 14 DAYS, Disp: 2 mL, Rfl: 11 ?  sucralfate (CARAFATE) 1 g tablet, Take 1 g by mouth daily as needed (GI sympotms). , Disp: , Rfl:  ?  venlafaxine XR (EFFEXOR-XR) 150 MG 24 hr capsule, TAKE 1 CAPSULE BY MOUTH  DAILY WITH BREAKFAST, Disp: 90 capsule, Rfl: 3 ?  vitamin B-12 (CYANOCOBALAMIN) 1000 MCG tablet, Take 1,000 mcg by mouth daily., Disp: , Rfl:  ? ?Social History  ? ?Tobacco Use  ?Smoking Status Former  ? Packs/day: 1.00  ? Years: 15.00  ? Pack years: 15.00  ? Types: Cigarettes  ? Quit date: 08/17/1983  ? Years since quitting: 38.2  ?Smokeless Tobacco Never  ? ? ?Allergies  ?Allergen Reactions  ? Levaquin [Levofloxacin]   ? Augmentin [Amoxicillin-Pot Clavulanate] Other (See Comments)  ?  Questionable itching  ? Bactrim [Sulfamethoxazole-Trimethoprim] Rash  ? Doxycycline Itching  ? Hydrocodone-Acetaminophen Other (See Comments)  ?  GI distress  ? Pseudoephedrine Rash  ?  Itching of the scalp  ? Shellfish Allergy Nausea And Vomiting  ?  Nausea and vomiting ?  ? ?Objective:  ?There were no vitals filed for this visit. ?There is no height or weight on file to calculate BMI. ?Constitutional Well developed. ?Well nourished.  ?Vascular Dorsalis pedis pulses palpable bilaterally. ?Posterior tibial pulses palpable bilaterally. ?Capillary refill normal to all digits.  ?No cyanosis or clubbing noted. ?Pedal hair growth normal.  ?Neurologic Normal speech. ?Oriented to person, place, and time. ?Epicritic sensation to light touch grossly present bilaterally.  ?Dermatologic Nails well groomed and normal in appearance. ?No open wounds. ?No skin lesions.  ?Orthopedic: Pain on palpation left Achilles tendon insertion pain with range of motion including dorsiflexion of the ankle joint.  No pain with plantarflexion eversion inversion.  No deep intra-articular ankle  pain noted.  Haglund's deformity clinically appreciated.  Positive Silfverskiold test with gastrocnemius equinus ? ?Pain on palpation of right dorsal midfoot at the first tarsometatarsal joint.  Clinically able to appreciate osteoarthritic changes/spurring.  No other bony abnormalities identified.  No extensor tendinitis noted.  ? ?Radiographs: 3 views of skeletally mature adult bilateral foot: Posterior and plantar heel spurring noted bilaterally.  Midfoot arthritis noted.  Mild ankle joint arthritis noted.  No other bony abnormalities identified.  No stress fracture noted. ?Assessment:  ? ?  1. Achilles tendinitis, left leg   ?2. Haglund's deformity, left   ?3. Gastrocnemius equinus, left   ? ? ? ?Plan:  ?Patient was evaluated and treated and all questions answered. ? ?Left Achilles tendinitis with underlying Haglund's deformity/gastrocnemius equinus ?-I explained the patient the etiology of Achilles tendinitis and various treatment options were discussed.  -Continue cam boot immobilization ?-Given the amount of pain that she still having I believe she will benefit from a steroid injection to help decrease the residual inflammation that is present thus causing her pain.  Patient agrees with plan like to proceed with steroid injection.  I discussed with her there is a risk of rupture associated with it.  She states understand would like to proceed despite the risks ?-A second steroid injection was performed at left Kager's fat pad using 1% plain Lidocaine and 10 mg of Kenalog. This was well tolerated. ?-MRI was ordered to assess tear ? ? ? ?Right midfoot arthritis ?-Clinically resolved ? ? ? ?No follow-ups on file.  ? ?

## 2021-11-27 ENCOUNTER — Ambulatory Visit: Payer: Medicare Other | Admitting: Medical

## 2021-11-27 ENCOUNTER — Encounter: Payer: Self-pay | Admitting: Medical

## 2021-11-27 VITALS — BP 130/70 | HR 66 | Ht 65.0 in | Wt 237.4 lb

## 2021-11-27 DIAGNOSIS — G4733 Obstructive sleep apnea (adult) (pediatric): Secondary | ICD-10-CM

## 2021-11-27 DIAGNOSIS — I5032 Chronic diastolic (congestive) heart failure: Secondary | ICD-10-CM

## 2021-11-27 DIAGNOSIS — I5189 Other ill-defined heart diseases: Secondary | ICD-10-CM | POA: Diagnosis not present

## 2021-11-27 DIAGNOSIS — E782 Mixed hyperlipidemia: Secondary | ICD-10-CM | POA: Diagnosis not present

## 2021-11-27 DIAGNOSIS — I471 Supraventricular tachycardia: Secondary | ICD-10-CM

## 2021-11-27 DIAGNOSIS — I10 Essential (primary) hypertension: Secondary | ICD-10-CM | POA: Diagnosis not present

## 2021-11-27 NOTE — Patient Instructions (Signed)

## 2021-11-27 NOTE — Progress Notes (Signed)
?Cardiology Office Note:   ? ?Date:  11/30/2021  ? ?ID:  Nicole Sims, DOB 10-20-1950, MRN [id number] ? ?PCP:  Einar Pheasant, MD  ?[place] Cardiologist:  Kathlyn Sacramento, MD  ?[facility] Electrophysiologist:  None  ? ?Referring MD: Einar Pheasant, MD  ? ?Chief Complaint:  1 year follow-up ? ?History of Present Illness:   ? ?Nicole Sims is a 71 y.o. female with a hx of  with a hx of chronic diastolic heart failure, paroxysmal SVT, hyperlipidemia, hypertension, sleep apnea on CPAP, and obesity presents today for 1 year follow-up appointment. ? ?The patient was evaluated in 2019 for persistent exertional dyspnea.  Right/left heart cath 03/2018 normal coronary arteries with normal LVEF, RHC with mild elevated filling pressures with pulmonary capillary wedge pressure 30 mmHg, no pulmonary hypertension, normal cardiac output.  CTA of chest no evidence of PE.  Subsequently evaluated for palpitations 6-20 20 with Zio patch showing predominant sinus rhythm with short runs of SVT longest lasting 7 beats.  Toprol was titrated 100 mg once daily with improvement in her symptoms. ?  ?Seen by pulmonology 10/2019 with increasing shortness of breath.  Echo at that time with EF 60 to 65%, no R WMA, mild LVH, grade 1 diastolic function, normal RV systolic function, RV cavity size, trivial MR.  VQ scan low probability for PE. ? ?Last seen 10/02/21 for ER follow-up for CP and SOB in the setting of recent COVID infection. CP resolved but she still had DOE so an echo was ordered. Also Crestor was switched to [place]. Follow-up LDL ws 66.  ? ?Today, the patient reports Repatha is working well for her. No chest pain. She has intermittent shortness of breath, worse now with allergies and the spring. No lower leg edema. She uses Lasix as needed. Has occasional fluttering in her chest, very rare.  ? ?Past Medical History:  ?Diagnosis Date  ? Anemia   ? Anginal pain ([place])   ? Anxiety   ? Asthma   ? Cataract cortical, senile   ? CHF  (congestive heart failure) ([place])   ? Chronic headaches   ? Diastolic heart failure ([place])   ? Diverticulosis   ? Environmental allergies   ? GERD (gastroesophageal reflux disease)   ? Heart murmur   ? Hyperglycemia   ? Hyperlipidemia   ? Hypertension   ? Leukocytosis   ? Obesity   ? Osteoarthritis   ? Palpitations   ? Restless leg syndrome   ? Sleep apnea   ? Thrombocytosis   ? Urinary incontinence   ? mixed  ? Venous insufficiency   ? Vitamin D deficiency   ? Wears dentures   ? partial upper  ? ? ?Past Surgical History:  ?Procedure Laterality Date  ? BLADDER SURGERY    ? x2   [place] [facility] [place]  ? BREAST CYST ASPIRATION Bilateral 2005  ? approximate year  ? CARDIAC CATHETERIZATION    ? Nehemiah Massed  ? CATARACT EXTRACTION W/PHACO Right 04/24/2020  ? Procedure: CATARACT EXTRACTION PHACO AND INTRAOCULAR LENS PLACEMENT ([place]) RIGHT;  Surgeon: Birder Robson, MD;  Location: [place];  Service: Ophthalmology;  Laterality: Right;  6.75 ?0:37.3  ? CATARACT EXTRACTION W/PHACO Left 01/27/2021  ? Procedure: CATARACT EXTRACTION PHACO AND INTRAOCULAR LENS PLACEMENT ([place]) LEFT;  Surgeon: Birder Robson, MD;  Location: [place];  Service: Ophthalmology;  Laterality: Left;  6.75 ?00:43.9  ? CERVICAL CONE BIOPSY    ? CIS  ? CHOLECYSTECTOMY    ? COLONOSCOPY  WITH PROPOFOL N/A 07/19/2016  ? Procedure: COLONOSCOPY WITH PROPOFOL;  Surgeon: Manya Silvas, MD;  [facility] END[facility]COPY;  Service: Endoscopy;  Laterality: N/A;  ? COLONOSCOPY WITH PROPOFOL N/A 10/20/2018  ? Procedure: COLONOSCOPY WITH PROPOFOL;  Surgeon: Lollie Sails, MD;  [facility] ENDOSCOPY;  Service: Endoscopy;  Laterality: N/A;  ? ESOPHAGOGASTRODUODENOSCOPY N/A 05/12/2021  ? Procedure: ESOPHAGOGASTRODUODENOSCOPY (EGD);  Surgeon: Lesly Rubenstein, MD;  Location: [facility] ENDOSCOPY;  Service: Endoscopy;  Laterality: N/A;  ? ESOPHAGOGASTRODUODENOSCOPY (EGD) WITH PROPOFOL N/A 07/19/2016  ? Procedure: ESOPHAGOGASTRODUODENOSCOPY (EGD) WITH  PROPOFOL;  Surgeon: Manya Silvas, MD;  Location: [facility] ENDOSCOPY;  Service: Endoscopy;  Laterality: N/A;  ? FLEXIBLE SIGMOIDOSCOPY    ? KNEE ARTHROSCOPY  08/13/08  ? knee replacement and revision    ? left  ? RECTOCELE REPAIR    ? RIGHT/LEFT HEART CATH AND CORONARY ANGIOGRAPHY Bilateral 04/10/2018  ? Procedure: RIGHT/LEFT HEART CATH AND CORONARY ANGIOGRAPHY;  Surgeon: Wellington Hampshire, MD;  Location: [place] CV LAB;  Service: Cardiovascular;  Laterality: Bilateral;  ? ROTATOR CUFF REPAIR    ? bilateral  ? SHOULDER SURGERY  11/17/05  ? TONSILLECTOMY  1962  ? VAGINAL HYSTERECTOMY  1974  ? abnormal pap and carcinoma in situ  ? ? ?Current Medications: ?Current Meds  ?Medication Sig  ? acetaminophen (TYLENOL) 500 MG tablet Take 500 mg by mouth every 6 (six) hours as needed.  ? AIMOVIG 140 MG/ML SOAJ 140 Milligram(s) SUB-Q Once a Month  ? albuterol (VENTOLIN HFA) 108 (90 Base) MCG/ACT inhaler Inhale 2 puffs into the lungs every 4 (four) hours as needed for wheezing or shortness of breath.  ? aspirin 325 MG tablet Take 325 mg by mouth daily.  ? baclofen (LIORESAL) 10 MG tablet Take 10 mg by mouth 3 (three) times daily as needed for muscle spasms.  ? budesonide-formoterol (SYMBICORT) 80-4.5 MCG/ACT inhaler Inhale 2 puffs into the lungs 2 (two) times daily.  ? Cholecalciferol (VITAMIN D-3) 1000 units CAPS Take 1,000 Units by mouth daily.  ? fluticasone (FLONASE) 50 MCG/ACT nasal spray Place 2 sprays into both nostrils daily.  ? furosemide (LASIX) 20 MG tablet Take 1 tablet (20 mg) by mouth once daily as needed for weight gain of 3 lbs or more overnight  ? metoprolol succinate (TOPROL-XL) 100 MG 24 hr tablet TAKE 1 TABLET BY MOUTH  DAILY WITH OR IMMEDIATLEY  FOLLOWING A MEAL  ? montelukast (SINGULAIR) 10 MG tablet TAKE 1 TABLET BY MOUTH AT  BEDTIME  ? Omega-3 Fatty Acids (FISH OIL) 1000 MG CAPS Take 1,000 mg by mouth.  ? ondansetron (ZOFRAN) 4 MG tablet Take 1 tablet (4 mg total) by mouth 2 (two) times daily as needed  for nausea or vomiting.  ? potassium chloride (KLOR-CON) 10 MEQ tablet Take 1 tablet (10 meq) by mouth once daily as needed only when taking lasix (furosemide)  ? pregabalin (LYRICA) 50 MG capsule Take by mouth. Take 1 capsule by mouth in the morning and 2 capsules at night.  ? RABEprazole (ACIPHEX) 20 MG tablet Take 20 mg by mouth 2 (two) times daily.  ? REPATHA 140 MG/ML SOSY INJECT 140 MG UNDER THE SKIN EVERY 14 DAYS  ? sucralfate (CARAFATE) 1 g tablet Take 1 g by mouth daily as needed (GI sympotms).   ? venlafaxine XR (EFFEXOR-XR) 150 MG 24 hr capsule TAKE 1 CAPSULE BY MOUTH  DAILY WITH BREAKFAST  ? vitamin B-12 (CYANOCOBALAMIN) 1000 MCG tablet Take 1,000 mcg by mouth daily.  ?  ? ?  Allergies:   Levaquin [levofloxacin], Augmentin [amoxicillin-pot clavulanate], Bactrim [sulfamethoxazole-trimethoprim], Doxycycline, Hydrocodone-acetaminophen, Pseudoephedrine, and Shellfish allergy  ? ?Social History  ? ?Socioeconomic History  ? Marital status: Married  ?  Spouse name: Not on file  ? Number of children: Not on file  ? Years of education: Not on file  ? Highest education level: Not on file  ?Occupational History  ? Occupation: retired  ?Tobacco Use  ? Smoking status: Former  ?  Packs/day: 1.00  ?  Years: 15.00  ?  Pack years: 15.00  ?  Types: Cigarettes  ?  Quit date: 08/17/1983  ?  Years since quitting: 38.3  ? Smokeless tobacco: Never  ?Vaping Use  ? Vaping Use: Never used  ?Substance and Sexual Activity  ? Alcohol use: Yes  ?  Alcohol/week: 0.0 standard drinks  ?  Comment: rarely  ? Drug use: No  ? Sexual activity: Not on file  ?Other Topics Concern  ? Not on file  ?Social History Narrative  ? Lives at home with husband  ? ?Social Determinants of Health  ? ?Financial Resource Strain: Not on file  ?Food Insecurity: Not on file  ?Transportation Needs: Not on file  ?Physical Activity: Not on file  ?Stress: Not on file  ?Social Connections: Not on file  ?  ? ?Family History: ?The patient's family history includes  Alzheimer's disease in her father, paternal aunt, paternal aunt, paternal uncle, paternal uncle, and paternal uncle; Breast cancer in her maternal aunt; Diabetes Mellitus II in her father; Heart Problems in her br

## 2021-12-01 ENCOUNTER — Encounter: Payer: Self-pay | Admitting: Internal Medicine

## 2021-12-01 ENCOUNTER — Ambulatory Visit (INDEPENDENT_AMBULATORY_CARE_PROVIDER_SITE_OTHER): Payer: Medicare Other | Admitting: Internal Medicine

## 2021-12-01 DIAGNOSIS — K219 Gastro-esophageal reflux disease without esophagitis: Secondary | ICD-10-CM | POA: Diagnosis not present

## 2021-12-01 DIAGNOSIS — I471 Supraventricular tachycardia: Secondary | ICD-10-CM | POA: Diagnosis not present

## 2021-12-01 DIAGNOSIS — F439 Reaction to severe stress, unspecified: Secondary | ICD-10-CM

## 2021-12-01 DIAGNOSIS — I5030 Unspecified diastolic (congestive) heart failure: Secondary | ICD-10-CM

## 2021-12-01 DIAGNOSIS — E7800 Pure hypercholesterolemia, unspecified: Secondary | ICD-10-CM

## 2021-12-01 DIAGNOSIS — D473 Essential (hemorrhagic) thrombocythemia: Secondary | ICD-10-CM

## 2021-12-01 DIAGNOSIS — G4733 Obstructive sleep apnea (adult) (pediatric): Secondary | ICD-10-CM | POA: Diagnosis not present

## 2021-12-01 DIAGNOSIS — I700 Atherosclerosis of aorta: Secondary | ICD-10-CM | POA: Diagnosis not present

## 2021-12-01 DIAGNOSIS — I10 Essential (primary) hypertension: Secondary | ICD-10-CM | POA: Diagnosis not present

## 2021-12-01 DIAGNOSIS — R739 Hyperglycemia, unspecified: Secondary | ICD-10-CM

## 2021-12-01 DIAGNOSIS — K5900 Constipation, unspecified: Secondary | ICD-10-CM

## 2021-12-01 DIAGNOSIS — I6509 Occlusion and stenosis of unspecified vertebral artery: Secondary | ICD-10-CM | POA: Diagnosis not present

## 2021-12-01 DIAGNOSIS — M7660 Achilles tendinitis, unspecified leg: Secondary | ICD-10-CM | POA: Diagnosis not present

## 2021-12-01 DIAGNOSIS — R0981 Nasal congestion: Secondary | ICD-10-CM

## 2021-12-01 NOTE — Progress Notes (Signed)
Patient ID: Nicole Sims, female   DOB: 27-Oct-1950, 71 y.o.   MRN: [id number] ? ? ?Subjective:  ? ? Patient ID: Nicole Sims, female    DOB: [birth year], 71 y.o.   MRN: [id number] ? ?This visit occurred during the SARS-CoV-2 public health emergency.  Safety protocols were in place, including screening questions prior to the visit, additional usage of staff PPE, and extensive cleaning of exam room while observing appropriate contact time as indicated for disinfecting solutions.  ? ?Patient here for a scheduled follow up.  ? ?Chief Complaint  ?Patient presents with  ? Follow-up  ?  3 mo f/u - pt reports increased seasonal allergies. Flonase not helping.  ? .  ? ?HPI ?Had f/u with cardiology 11/27/21- stable.  Continue BB therapy.  Has lasix to take prn.  On repatha and doing well.  Breathing overall stable.  Recommended f/u with pulmonary regarding OSA.  Saw podiatry for pain - achilles.  S/p injection.  Discussed MRI.  Seeing ortho for her right knee.  S/p fall approximately 6-8 weeks ago.  Using a cane.  No chest pain reported.  No nausea or vomiting.  Discussed constipation.  Discussed miralax.  ? ? ?Past Medical History:  ?Diagnosis Date  ? Anemia   ? Anginal pain ([place])   ? Anxiety   ? Asthma   ? Cataract cortical, senile   ? CHF (congestive heart failure) ([place])   ? Chronic headaches   ? Diastolic heart failure ([place])   ? Diverticulosis   ? Environmental allergies   ? GERD (gastroesophageal reflux disease)   ? Heart murmur   ? Hyperglycemia   ? Hyperlipidemia   ? Hypertension   ? Leukocytosis   ? Obesity   ? Osteoarthritis   ? Palpitations   ? Restless leg syndrome   ? Sleep apnea   ? Thrombocytosis   ? Urinary incontinence   ? mixed  ? Venous insufficiency   ? Vitamin D deficiency   ? Wears dentures   ? partial upper  ? ?Past Surgical History:  ?Procedure Laterality Date  ? BLADDER SURGERY    ? x2   [place]  ? BREAST CYST ASPIRATION Bilateral 2005  ? approximate year  ? CARDIAC CATHETERIZATION    ?  Nehemiah Massed  ? CATARACT EXTRACTION W/PHACO Right 04/24/2020  ? Procedure: CATARACT EXTRACTION PHACO AND INTRAOCULAR LENS PLACEMENT ([place]) RIGHT;  Surgeon: Birder Robson, MD;  Location: [place];  Service: Ophthalmology;  Laterality: Right;  6.75 ?0:37.3  ? CATARACT EXTRACTION W/PHACO Left 01/27/2021  ? Procedure: CATARACT EXTRACTION PHACO AND INTRAOCULAR LENS PLACEMENT ([place]) LEFT;  Surgeon: Birder Robson, MD;  Location: [place];  Service: Ophthalmology;  Laterality: Left;  6.75 ?00:43.9  ? CERVICAL CONE BIOPSY    ? CIS  ? CHOLECYSTECTOMY    ? COLONOSCOPY WITH PROPOFOL N/A 07/19/2016  ? Procedure: COLONOSCOPY WITH PROPOFOL;  Surgeon: Manya Silvas, MD;  Location: [facility] ENDOSCOPY;  Service: Endoscopy;  Laterality: N/A;  ? COLONOSCOPY WITH PROPOFOL N/A 10/20/2018  ? Procedure: COLONOSCOPY WITH PROPOFOL;  Surgeon: Lollie Sails, MD;  Location[facility] ENDOSCOPY;  Service: Endoscopy;  Laterality: N/A;  ? ESOPHAGOGASTRODUODENOSCOPY N/A 05/12/2021  ? Procedure: ESOPHAGOGASTRODUODENOSCOPY (EGD);  Surgeon: Lesly Rubenstein, MD;  Location: [facility] ENDOSCOPY;  Service: Endoscopy;  Laterality: N/A;  ? ESOPHAGOGASTRODUODENOSCOPY (EGD) WITH PROPOFOL N/A 07/19/2016  ? Procedure: ESOPHAGOGASTRODUODENOSCOPY (EGD) WITH PROPOFOL;  Surgeon: Manya Silvas, MD;  Location[facility] ENDOSCOPY;  Service: Endoscopy;  Laterality: N/A;  ?  FLEXIBLE SIGMOIDOSCOPY    ? KNEE ARTHROSCOPY  08/13/08  ? knee replacement and revision    ? left  ? RECTOCELE REPAIR    ? RIGHT/LEFT HEART CATH AND CORONARY ANGIOGRAPHY Bilateral 04/10/2018  ? Procedure: RIGHT/LEFT HEART CATH AND CORONARY ANGIOGRAPHY;  Surgeon: Wellington Hampshire, MD;  Location: [place] [facility];  Service: Cardiovascular;  Laterality: Bilateral;  ? ROTATOR CUFF REPAIR    ? bilateral  ? SHOULDER SURGERY  11/17/05  ? TONSILLECTOMY  1962  ? VAGINAL HYSTERECTOMY  1974  ? abnormal pap and carcinoma in situ  ? ?Family History  ?Problem Relation Age of Onset  ? Diabetes  Mellitus II Father   ? Thyroid disease Father   ? Alzheimer's disease Father   ? Breast cancer Maternal Aunt   ? Hypertension Mother   ? Heart Problems Brother   ? Leukemia Maternal Aunt   ? Alzheimer's disease Paternal Aunt   ? Alzheimer's disease Paternal Uncle   ? Alzheimer's disease Paternal Uncle   ? Alzheimer's disease Paternal Uncle   ? Alzheimer's disease Paternal Aunt   ? Colon cancer Neg Hx   ? Kidney cancer Neg Hx   ? Bladder Cancer Neg Hx   ? ?Social History  ? ?Socioeconomic History  ? Marital status: Married  ?  Spouse name: Not on file  ? Number of children: Not on file  ? Years of education: Not on file  ? Highest education level: Not on file  ?Occupational History  ? Occupation: retired  ?Tobacco Use  ? Smoking status: Former  ?  Packs/day: 1.00  ?  Years: 15.00  ?  Pack years: 15.00  ?  Types: Cigarettes  ?  Quit date: 08/17/1983  ?  Years since quitting: 38.3  ? Smokeless tobacco: Never  ?Vaping Use  ? Vaping Use: Never used  ?Substance and Sexual Activity  ? Alcohol use: Yes  ?  Alcohol/week: 0.0 standard drinks  ?  Comment: rarely  ? Drug use: No  ? Sexual activity: Not on file  ?Other Topics Concern  ? Not on file  ?Social History Narrative  ? Lives at home with husband  ? ?Social Determinants of Health  ? ?Financial Resource Strain: Not on file  ?Food Insecurity: Not on file  ?Transportation Needs: Not on file  ?Physical Activity: Not on file  ?Stress: Not on file  ?Social Connections: Not on file  ? ? ? ?Review of Systems  ?Constitutional:  Negative for appetite change and unexpected weight change.  ?HENT:  Negative for congestion and sinus pressure.   ?Respiratory:  Negative for cough, chest tightness and shortness of breath.   ?Cardiovascular:  Negative for chest pain, palpitations and leg swelling.  ?Gastrointestinal:  Negative for abdominal pain, diarrhea, nausea and vomiting.  ?Genitourinary:  Negative for difficulty urinating and dysuria.  ?Musculoskeletal:  Negative for joint swelling.   ?Skin:  Negative for color change and rash.  ?Neurological:  Negative for dizziness, light-headedness and headaches.  ?Psychiatric/Behavioral:  Negative for agitation and dysphoric mood.   ? ?   ?Objective:  ?  ? ?BP 124/82 (BP Location: Left Arm, Patient Position: Sitting, Cuff Size: Small)   Pulse 65   Temp 98.7 ?F (37.1 ?C) (Temporal)   Resp 16   Ht '5\' 5"'$  (1.651 m)   Wt 238 lb 6.4 oz (108.1 kg)   SpO2 98%   BMI 39.67 kg/m?  ?Wt Readings from Last 3 Encounters:  ?12/01/21 238 lb 6.4 oz (108.1 kg)  ?  11/27/21 237 lb 6.4 oz (107.7 kg)  ?10/02/21 234 lb (106.1 kg)  ? ? ?Physical Exam ?Vitals reviewed.  ?Constitutional:   ?   General: She is not in acute distress. ?   Appearance: Normal appearance.  ?HENT:  ?   Head: Normocephalic and atraumatic.  ?   Right Ear: External ear normal.  ?   Left Ear: External ear normal.  ?Eyes:  ?   General: No scleral icterus.    ?   Right eye: No discharge.     ?   Left eye: No discharge.  ?   Conjunctiva/sclera: Conjunctivae normal.  ?Neck:  ?   Thyroid: No thyromegaly.  ?Cardiovascular:  ?   Rate and Rhythm: Normal rate and regular rhythm.  ?Pulmonary:  ?   Effort: No respiratory distress.  ?   Breath sounds: Normal breath sounds. No wheezing.  ?Abdominal:  ?   General: Bowel sounds are normal.  ?   Palpations: Abdomen is soft.  ?   Tenderness: There is no abdominal tenderness.  ?Musculoskeletal:     ?   General: No swelling or tenderness.  ?   Cervical back: Neck supple. No tenderness.  ?Lymphadenopathy:  ?   Cervical: No cervical adenopathy.  ?Skin: ?   Findings: No erythema or rash.  ?Neurological:  ?   Mental Status: She is alert.  ?Psychiatric:     ?   Mood and Affect: Mood normal.     ?   Behavior: Behavior normal.  ? ? ? ?Outpatient Encounter Medications as of 12/01/2021  ?Medication Sig  ? acetaminophen (TYLENOL) 500 MG tablet Take 500 mg by mouth every 6 (six) hours as needed.  ? AIMOVIG 140 MG/ML SOAJ 140 Milligram(s) SUB-Q Once a Month  ? albuterol (VENTOLIN HFA)  108 (90 Base) MCG/ACT inhaler Inhale 2 puffs into the lungs every 4 (four) hours as needed for wheezing or shortness of breath.  ? aspirin 325 MG tablet Take 325 mg by mouth daily.  ? baclofen (LIORESAL) 10 MG tablet [place]

## 2021-12-02 ENCOUNTER — Telehealth: Payer: Self-pay | Admitting: Internal Medicine

## 2021-12-02 NOTE — Telephone Encounter (Signed)
Copied from [place] [phone]. Topic: Medicare AWV ?>> Dec 02, 2021 10:17 AM Harris-Coley, Hannah Beat wrote: ?Reason for CRM: Left message for patient to schedule Annual Wellness Visit.  Please schedule with Nurse Health Advisor Denisa O'Brien-Blaney, LPN at [facility].  Please call [phone] ask for Juliann Pulse ?

## 2021-12-05 ENCOUNTER — Encounter: Payer: Self-pay | Admitting: Internal Medicine

## 2021-12-05 DIAGNOSIS — K5900 Constipation, unspecified: Secondary | ICD-10-CM | POA: Insufficient documentation

## 2021-12-05 NOTE — Assessment & Plan Note (Signed)
Worked up previously by hematology.  Recheck cbc with next labs.   ?

## 2021-12-05 NOTE — Assessment & Plan Note (Addendum)
On repatha and doing well.  Follow.   

## 2021-12-05 NOTE — Assessment & Plan Note (Signed)
Being followed by podiatry.   ?

## 2021-12-05 NOTE — Assessment & Plan Note (Signed)
Continue aspirin and statin. 

## 2021-12-05 NOTE — Assessment & Plan Note (Signed)
Continue toprol.  Blood pressure doing well.  Follow blood pressure.  Follow metabolic panel.  

## 2021-12-05 NOTE — Assessment & Plan Note (Signed)
History of diastolic dysfunction.  EF 60-65%.  No increased sob reported.  Follow. Continue metoprolol.  

## 2021-12-05 NOTE — Assessment & Plan Note (Signed)
Continue aciphex.  

## 2021-12-05 NOTE — Assessment & Plan Note (Signed)
Continue singulair.  Flonase.  ?

## 2021-12-05 NOTE — Assessment & Plan Note (Signed)
Low carb diet and exercise.  Follow met b and a1c.  ?

## 2021-12-05 NOTE — Assessment & Plan Note (Signed)
Continue effexor.  Stable.  Follow.  ?

## 2021-12-05 NOTE — Assessment & Plan Note (Signed)
Needs to f/u with pulmonary regarding cpap.  ?

## 2021-12-05 NOTE — Assessment & Plan Note (Signed)
Miralax

## 2021-12-05 NOTE — Assessment & Plan Note (Signed)
Has a history of PSVT.  Has seen Dr Arida.  On metoprolol.  No increased heart rate or palpitations reported.  Continue metoprolol.   

## 2021-12-05 NOTE — Assessment & Plan Note (Signed)
Continue crestor 

## 2021-12-07 ENCOUNTER — Ambulatory Visit
Admission: RE | Admit: 2021-12-07 | Discharge: 2021-12-07 | Disposition: A | Discharge status: Home / Self Care | Payer: Medicare Other | Source: Ambulatory Visit | Attending: Podiatry | Admitting: Podiatry

## 2021-12-07 DIAGNOSIS — M65872 Other synovitis and tenosynovitis, left ankle and foot: Secondary | ICD-10-CM | POA: Diagnosis not present

## 2021-12-07 DIAGNOSIS — M19072 Primary osteoarthritis, left ankle and foot: Secondary | ICD-10-CM | POA: Diagnosis not present

## 2021-12-07 DIAGNOSIS — S86012A Strain of left Achilles tendon, initial encounter: Secondary | ICD-10-CM | POA: Diagnosis not present

## 2021-12-07 DIAGNOSIS — M7662 Achilles tendinitis, left leg: Secondary | ICD-10-CM

## 2021-12-15 ENCOUNTER — Other Ambulatory Visit: Payer: Self-pay | Admitting: Gastroenterology

## 2021-12-15 DIAGNOSIS — R1011 Right upper quadrant pain: Secondary | ICD-10-CM | POA: Diagnosis not present

## 2021-12-22 ENCOUNTER — Ambulatory Visit: Payer: Medicare Other | Admitting: Podiatry

## 2021-12-22 DIAGNOSIS — Z01818 Encounter for other preprocedural examination: Secondary | ICD-10-CM

## 2021-12-22 DIAGNOSIS — M21862 Other specified acquired deformities of left lower leg: Secondary | ICD-10-CM | POA: Diagnosis not present

## 2021-12-22 DIAGNOSIS — M7662 Achilles tendinitis, left leg: Secondary | ICD-10-CM

## 2021-12-22 DIAGNOSIS — M9262 Juvenile osteochondrosis of tarsus, left ankle: Secondary | ICD-10-CM

## 2021-12-22 NOTE — Progress Notes (Signed)
?Subjective:  ?Patient ID: Nicole Sims, female    DOB: 03-27-1951,  MRN: [id number] ? ?Chief Complaint  ?Patient presents with  ? Foot Pain  ? ? ?71 y.o. female presents with the above complaint.  Patient presents for follow-up of left Achilles tendinitis insertional pain.  She states that her pain is about the same.  Is manageable but hurting a lot.  She has failed all conservative treatment option would like to discuss surgical options at this time ?Review of Systems: Negative except as noted in the HPI. Denies N/V/F/Ch. ? ?Past Medical History:  ?Diagnosis Date  ? Anemia   ? Anginal pain ([place])   ? Anxiety   ? Asthma   ? Cataract cortical, senile   ? CHF (congestive heart failure) ([place])   ? Chronic headaches   ? Diastolic heart failure ([place])   ? Diverticulosis   ? Environmental allergies   ? GERD (gastroesophageal reflux disease)   ? Heart murmur   ? Hyperglycemia   ? Hyperlipidemia   ? Hypertension   ? Leukocytosis   ? Obesity   ? Osteoarthritis   ? Palpitations   ? Restless leg syndrome   ? Sleep apnea   ? Thrombocytosis   ? Urinary incontinence   ? mixed  ? Venous insufficiency   ? Vitamin D deficiency   ? Wears dentures   ? partial upper  ? ? ?Current Outpatient Medications:  ?  acetaminophen (TYLENOL) 500 MG tablet, Take 500 mg by mouth every 6 (six) hours as needed., Disp: , Rfl:  ?  AIMOVIG 140 MG/ML SOAJ, 140 Milligram(s) SUB-Q Once a Month, Disp: , Rfl:  ?  albuterol (VENTOLIN HFA) 108 (90 Base) MCG/ACT inhaler, Inhale 2 puffs into the lungs every 4 (four) hours as needed for wheezing or shortness of breath., Disp: 1 each, Rfl: 0 ?  aspirin 325 MG tablet, Take 325 mg by mouth daily., Disp: , Rfl:  ?  baclofen (LIORESAL) 10 MG tablet, Take 10 mg by mouth 3 (three) times daily as needed for muscle spasms., Disp: , Rfl:  ?  budesonide-formoterol (SYMBICORT) 80-4.5 MCG/ACT inhaler, Inhale 2 puffs into the lungs 2 (two) times daily., Disp: 1 each, Rfl: 0 ?  Cholecalciferol (VITAMIN D-3) 1000 units CAPS, Take  1,000 Units by mouth daily., Disp: , Rfl:  ?  fluticasone (FLONASE) 50 MCG/ACT nasal spray, Place 2 sprays into both nostrils daily., Disp: 48 g, Rfl: 3 ?  furosemide (LASIX) 20 MG tablet, Take 1 tablet (20 mg) by mouth once daily as needed for weight gain of 3 lbs or more overnight, Disp: , Rfl:  ?  linaclotide (LINZESS) 290 MCG CAPS capsule, Take 290 mcg by mouth daily before breakfast., Disp: , Rfl:  ?  metoprolol succinate (TOPROL-XL) 100 MG 24 hr tablet, TAKE 1 TABLET BY MOUTH  DAILY WITH OR IMMEDIATLEY  FOLLOWING A MEAL, Disp: 90 tablet, Rfl: 0 ?  montelukast (SINGULAIR) 10 MG tablet, TAKE 1 TABLET BY MOUTH AT  BEDTIME, Disp: 90 tablet, Rfl: 3 ?  nitrofurantoin, macrocrystal-monohydrate, (MACROBID) 100 MG capsule, Take 1 capsule (100 mg total) by mouth 2 (two) times daily., Disp: 10 capsule, Rfl: 0 ?  nystatin (MYCOSTATIN) 100000 UNIT/ML suspension, 5 mls - swish and spit tid., Disp: 60 mL, Rfl: 0 ?  Omega-3 Fatty Acids (FISH OIL) 1000 MG CAPS, Take 1,000 mg by mouth., Disp: , Rfl:  ?  ondansetron (ZOFRAN) 4 MG tablet, Take 1 tablet (4 mg total) by mouth 2 (two) times daily  as needed for nausea or vomiting., Disp: 15 tablet, Rfl: 0 ?  potassium chloride (KLOR-CON) 10 MEQ tablet, Take 1 tablet (10 meq) by mouth once daily as needed only when taking lasix (furosemide), Disp: , Rfl:  ?  predniSONE (DELTASONE) 10 MG tablet, Take 4 tablets x 1 day and then decrease by 1/2 tablet per day until down to zero mg., Disp: 18 tablet, Rfl: 0 ?  pregabalin (LYRICA) 50 MG capsule, Take by mouth. Take 1 capsule by mouth in the morning and 2 capsules at night., Disp: , Rfl:  ?  RABEprazole (ACIPHEX) 20 MG tablet, Take 20 mg by mouth 2 (two) times daily., Disp: , Rfl:  ?  REPATHA 140 MG/ML SOSY, INJECT 140 MG UNDER THE SKIN EVERY 14 DAYS, Disp: 2 mL, Rfl: 11 ?  sucralfate (CARAFATE) 1 g tablet, Take 1 g by mouth daily as needed (GI sympotms). , Disp: , Rfl:  ?  venlafaxine XR (EFFEXOR-XR) 150 MG 24 hr capsule, TAKE 1 CAPSULE BY  MOUTH  DAILY WITH BREAKFAST, Disp: 90 capsule, Rfl: 3 ?  vitamin B-12 (CYANOCOBALAMIN) 1000 MCG tablet, Take 1,000 mcg by mouth daily., Disp: , Rfl:  ? ?Social History  ? ?Tobacco Use  ?Smoking Status Former  ? Packs/day: 1.00  ? Years: 15.00  ? Pack years: 15.00  ? Types: Cigarettes  ? Quit date: 08/17/1983  ? Years since quitting: 38.3  ?Smokeless Tobacco Never  ? ? ?Allergies  ?Allergen Reactions  ? Levaquin [Levofloxacin]   ? Augmentin [Amoxicillin-Pot Clavulanate] Other (See Comments)  ?  Questionable itching  ? Bactrim [Sulfamethoxazole-Trimethoprim] Rash  ? Doxycycline Itching  ? Hydrocodone-Acetaminophen Other (See Comments)  ?  GI distress  ? Pseudoephedrine Rash  ?  Itching of the scalp  ? Shellfish Allergy Nausea And Vomiting  ?  Nausea and vomiting ?  ? ?Objective:  ?There were no vitals filed for this visit. ?There is no height or weight on file to calculate BMI. ?Constitutional Well developed. ?Well nourished.  ?Vascular Dorsalis pedis pulses palpable bilaterally. ?Posterior tibial pulses palpable bilaterally. ?Capillary refill normal to all digits.  ?No cyanosis or clubbing noted. ?Pedal hair growth normal.  ?Neurologic Normal speech. ?Oriented to person, place, and time. ?Epicritic sensation to light touch grossly present bilaterally.  ?Dermatologic Nails well groomed and normal in appearance. ?No open wounds. ?No skin lesions.  ?Orthopedic: Pain on palpation left Achilles tendon insertion pain with range of motion including dorsiflexion of the ankle joint.  No pain with plantarflexion eversion inversion.  No deep intra-articular ankle pain noted.  Haglund's deformity clinically appreciated.  Positive Silfverskiold test with gastrocnemius equinus ? ?Pain on palpation of right dorsal midfoot at the first tarsometatarsal joint.  Clinically able to appreciate osteoarthritic changes/spurring.  No other bony abnormalities identified.  No extensor tendinitis noted.  ? ?Radiographs: 3 views of skeletally  mature adult bilateral foot: Posterior and plantar heel spurring noted bilaterally.  Midfoot arthritis noted.  Mild ankle joint arthritis noted.  No other bony abnormalities identified.  No stress fracture noted. ?Assessment:  ? ?1. Achilles tendinitis, left leg   ?2. Haglund's deformity, left   ?3. Gastrocnemius equinus, left   ?4. Encounter for preoperative examination for general surgical procedure   ? ?1. Significant Achilles tendinopathy with interstitial tears. ?Partial-thickness tear of the deep attachment fibers from the ?calcaneal attachment site. Associated Haglund's deformity/pump bump ?involving the calcaneus likely contributing. ?2. Intact medial and lateral ankle ligaments and tendons. ?3. Moderate midfoot degenerative changes. ? ? ?Plan:  ?  Patient was evaluated and treated and all questions answered. ? ?Left Achilles tendinitis with underlying Haglund's deformity/gastrocnemius equinus ?-I explained the patient the etiology of Achilles tendinitis and various treatment options were discussed.  -Continue cam boot immobilization ?-Clinically her pain is about the same the injection did not help immobilization did not help.  At this time I discussed surgical options.  Given that she has failed all conservative treatment options with padding protecting immobilization and shoe gear modification I believe she will benefit from surgical intervention given that there is acute tearing within the Achilles tendon with some tendinosis.  I discussed this with patient and she states understanding and would like to proceed with surgery.  I discussed my preoperative intraoperative postoperative plan in extensive detail.  I will plan on doing right Achilles tendon repair with gastrocnemius recession with resection of Haglund's deformity she states understanding would like to proceed with surgery. ?-She will be nonweightbearing to the right lower extremity with either walker or wheelchair. ?-Informed surgical risk  consent was reviewed and read aloud to the patient.  I reviewed the films.  I have discussed my findings with the patient in great detail.  I have discussed all risks including but not limited to infection, s

## 2021-12-23 ENCOUNTER — Ambulatory Visit
Admission: RE | Admit: 2021-12-23 | Discharge: 2021-12-23 | Disposition: A | Discharge status: Home / Self Care | Payer: Medicare Other | Source: Ambulatory Visit | Attending: Gastroenterology | Admitting: Gastroenterology

## 2021-12-23 DIAGNOSIS — R1011 Right upper quadrant pain: Secondary | ICD-10-CM | POA: Diagnosis not present

## 2021-12-23 DIAGNOSIS — Z9049 Acquired absence of other specified parts of digestive tract: Secondary | ICD-10-CM | POA: Diagnosis not present

## 2021-12-29 DIAGNOSIS — M25561 Pain in right knee: Secondary | ICD-10-CM | POA: Diagnosis not present

## 2021-12-29 DIAGNOSIS — Z96651 Presence of right artificial knee joint: Secondary | ICD-10-CM | POA: Diagnosis not present

## 2021-12-29 DIAGNOSIS — T8484XA Pain due to internal orthopedic prosthetic devices, implants and grafts, initial encounter: Secondary | ICD-10-CM | POA: Diagnosis not present

## 2021-12-30 ENCOUNTER — Other Ambulatory Visit: Payer: Self-pay | Admitting: Orthopedic Surgery

## 2021-12-30 DIAGNOSIS — M25561 Pain in right knee: Secondary | ICD-10-CM

## 2021-12-30 DIAGNOSIS — Z96651 Presence of right artificial knee joint: Secondary | ICD-10-CM

## 2022-01-05 ENCOUNTER — Telehealth: Payer: Self-pay

## 2022-01-05 NOTE — Telephone Encounter (Signed)
DOS 02/01/2022  REPAIR ACHILLES TENDON LT - [id number] CALCANEAL OSTEOTOMY LT - [place] LT - [id number]  [employer] EFFECTIVE DATE - 08/16/2021  PLAN DEDUCTIBLE - $0.00 OUT OF POCKET - $3600.00 W/ $3110.99 REMAINING  CO-INSURANCE 0% / Day OUTPATIENT SURGERY 0% / [place] $295 / Day OUTPATIENT SURGERY $295 / [place]  Notification or Prior Authorization is not required for the requested services  Decision ID #:[id number]

## 2022-01-12 ENCOUNTER — Ambulatory Visit
Admission: RE | Admit: 2022-01-12 | Discharge: 2022-01-12 | Disposition: A | Discharge status: Home / Self Care | Payer: Medicare Other | Source: Ambulatory Visit | Attending: Orthopedic Surgery | Admitting: Orthopedic Surgery

## 2022-01-12 DIAGNOSIS — M25561 Pain in right knee: Secondary | ICD-10-CM | POA: Insufficient documentation

## 2022-01-12 DIAGNOSIS — M7989 Other specified soft tissue disorders: Secondary | ICD-10-CM | POA: Diagnosis not present

## 2022-01-12 DIAGNOSIS — Z96651 Presence of right artificial knee joint: Secondary | ICD-10-CM | POA: Insufficient documentation

## 2022-01-12 DIAGNOSIS — S8992XA Unspecified injury of left lower leg, initial encounter: Secondary | ICD-10-CM | POA: Diagnosis not present

## 2022-01-12 DIAGNOSIS — Z96642 Presence of left artificial hip joint: Secondary | ICD-10-CM | POA: Diagnosis not present

## 2022-01-12 MED ORDER — TECHNETIUM TC 99M MEDRONATE IV KIT
20.0000 | PACK | Freq: Once | INTRAVENOUS | Status: AC | PRN
  Administered 2022-01-12: 20.05 via INTRAVENOUS

## 2022-01-21 ENCOUNTER — Ambulatory Visit (INDEPENDENT_AMBULATORY_CARE_PROVIDER_SITE_OTHER): Payer: Medicare Other

## 2022-01-21 VITALS — Ht 65.0 in | Wt 238.0 lb

## 2022-01-21 DIAGNOSIS — Z0000 Encounter for general adult medical examination without abnormal findings: Secondary | ICD-10-CM

## 2022-01-21 NOTE — Patient Instructions (Addendum)
  Ms. Nowakowski , Thank you for taking time to come for your Medicare Wellness Visit. I appreciate your ongoing commitment to your health goals. Please review the following plan we discussed and let me know if I can assist you in the future.   These are the goals we discussed:  Goals      DIET - REDUCE SUGAR INTAKE        This is a list of the screening recommended for you and due dates:  Health Maintenance  Topic Date Due   COVID-19 Vaccine (4 - Booster for Pfizer series) 02/06/2022*   Zoster (Shingles) Vaccine (2 of 2) 03/02/2022*   Tetanus Vaccine  05/18/2022*   Pneumonia Vaccine (2 - PCV) 08/25/2022*   Mammogram  02/27/2022   Flu Shot  03/16/2022   Colon Cancer Screening  10/19/2028   DEXA scan (bone density measurement)  Completed   Hepatitis C Screening: USPSTF Recommendation to screen - Ages 49-79 yo.  Completed   HPV Vaccine  Aged Out  *Topic was postponed. The date shown is not the original due date.

## 2022-01-21 NOTE — Progress Notes (Signed)
Subjective:   Nicole Sims is a 71 y.o. female who presents for Medicare Annual (Subsequent) preventive examination.  Review of Systems    No ROS.  Medicare Wellness Virtual Visit.  Visual/audio telehealth visit, UTA vital signs.   See social history for additional risk factors.   Cardiac Risk Factors include: advanced age (>72mn, >>87women)     Objective:    Today's Vitals   01/21/22 1439  Weight: 238 lb (108 kg)  Height: '5\' 5"'$  (1.651 m)   Body mass index is 39.61 kg/m.     01/21/2022    2:50 PM 09/28/2021    5:27 PM 05/12/2021    7:41 AM 01/27/2021   10:52 AM 06/18/2020    9:54 AM 06/05/2020    1:55 PM 04/24/2020   10:21 AM  Advanced Directives  Does Patient Have a Medical Advance Directive? Yes Yes Yes Yes Yes Yes Yes  Type of AParamedicof AGibsonburgLiving will HGreenbushLiving will HRicardoLiving will HDeltaLiving will HSuncookLiving will HGladewaterLiving will HThorsbyLiving will  Does patient want to make changes to medical advance directive? No - Patient declined   No - Patient declined  No - Patient declined No - Patient declined  Copy of HSt. Mary'sin Chart? No - copy requested  No - copy requested Yes - validated most recent copy scanned in chart (See row information)  No - copy requested No - copy requested    Current Medications (verified) Outpatient Encounter Medications as of 01/21/2022  Medication Sig   acetaminophen (TYLENOL) 500 MG tablet Take 500 mg by mouth every 6 (six) hours as needed.   AIMOVIG 140 MG/ML SOAJ 140 Milligram(s) SUB-Q Once a Month   albuterol (VENTOLIN HFA) 108 (90 Base) MCG/ACT inhaler Inhale 2 puffs into the lungs every 4 (four) hours as needed for wheezing or shortness of breath.   aspirin 325 MG tablet Take 325 mg by mouth daily.   baclofen (LIORESAL) 10 MG tablet Take 10 mg  by mouth 3 (three) times daily as needed for muscle spasms.   budesonide-formoterol (SYMBICORT) 80-4.5 MCG/ACT inhaler Inhale 2 puffs into the lungs 2 (two) times daily.   Cholecalciferol (VITAMIN D-3) 1000 units CAPS Take 1,000 Units by mouth daily.   fluticasone (FLONASE) 50 MCG/ACT nasal spray Place 2 sprays into both nostrils daily.   furosemide (LASIX) 20 MG tablet Take 1 tablet (20 mg) by mouth once daily as needed for weight gain of 3 lbs or more overnight   linaclotide (LINZESS) 290 MCG CAPS capsule Take 290 mcg by mouth daily before breakfast.   metoprolol succinate (TOPROL-XL) 100 MG 24 hr tablet TAKE 1 TABLET BY MOUTH  DAILY WITH OR IMMEDIATLEY  FOLLOWING A MEAL   nystatin (MYCOSTATIN) 100000 UNIT/ML suspension 5 mls - swish and spit tid.   Omega-3 Fatty Acids (FISH OIL) 1000 MG CAPS Take 1,000 mg by mouth.   ondansetron (ZOFRAN) 4 MG tablet Take 1 tablet (4 mg total) by mouth 2 (two) times daily as needed for nausea or vomiting.   potassium chloride (KLOR-CON) 10 MEQ tablet Take 1 tablet (10 meq) by mouth once daily as needed only when taking lasix (furosemide)   pregabalin (LYRICA) 50 MG capsule Take by mouth. Take 1 capsule by mouth in the morning and 2 capsules at night.   RABEprazole (ACIPHEX) 20 MG tablet Take 20 mg by mouth 2 (  two) times daily.   REPATHA 140 MG/ML SOSY INJECT 140 MG UNDER THE SKIN EVERY 14 DAYS   sucralfate (CARAFATE) 1 g tablet Take 1 g by mouth daily as needed (GI sympotms).    venlafaxine XR (EFFEXOR-XR) 150 MG 24 hr capsule TAKE 1 CAPSULE BY MOUTH  DAILY WITH BREAKFAST   vitamin B-12 (CYANOCOBALAMIN) 1000 MCG tablet Take 1,000 mcg by mouth daily.   [DISCONTINUED] montelukast (SINGULAIR) 10 MG tablet TAKE 1 TABLET BY MOUTH AT  BEDTIME   [DISCONTINUED] nitrofurantoin, macrocrystal-monohydrate, (MACROBID) 100 MG capsule Take 1 capsule (100 mg total) by mouth 2 (two) times daily.   [DISCONTINUED] predniSONE (DELTASONE) 10 MG tablet Take 4 tablets x 1 day and then  decrease by 1/2 tablet per day until down to zero mg.   No facility-administered encounter medications on file as of 01/21/2022.    Allergies (verified) Levaquin [levofloxacin], Augmentin [amoxicillin-pot clavulanate], Bactrim [sulfamethoxazole-trimethoprim], Doxycycline, Hydrocodone-acetaminophen, Pseudoephedrine, and Shellfish allergy   History: Past Medical History:  Diagnosis Date   Anemia    Anginal pain ([place])    Anxiety    Asthma    Cataract cortical, senile    CHF (congestive heart failure) (HCC)    Chronic headaches    Diastolic heart failure (HCC)    Diverticulosis    Environmental allergies    GERD (gastroesophageal reflux disease)    Heart murmur    Hyperglycemia    Hyperlipidemia    Hypertension    Leukocytosis    Obesity    Osteoarthritis    Palpitations    Restless leg syndrome    Sleep apnea    Thrombocytosis    Urinary incontinence    mixed   Venous insufficiency    Vitamin D deficiency    Wears dentures    partial upper   Past Surgical History:  Procedure Laterality Date   BLADDER SURGERY     x2   [place] [facility] [place]   BREAST CYST ASPIRATION Bilateral 2005   approximate year   CARDIAC CATHETERIZATION     [place]   CATARACT EXTRACTION W/PHACO Right 04/24/2020   Procedure: CATARACT EXTRACTION PHACO AND INTRAOCULAR LENS PLACEMENT ([place]) RIGHT;  Surgeon: Birder Robson, MD;  Location: [facility];  Service: Ophthalmology;  Laterality: Right;  6.75 0:37.3   CATARACT EXTRACTION W/PHACO Left 01/27/2021   Procedure: CATARACT EXTRACTION PHACO AND INTRAOCULAR LENS PLACEMENT ([place]) LEFT;  Surgeon: Birder Robson, MD;  Location: [place];  Service: Ophthalmology;  Laterality: Left;  6.75 00:43.9   CERVICAL CONE BIOPSY     CIS   CHOLECYSTECTOMY     COLONOSCOPY WITH PROPOFOL N/A 07/19/2016   Procedure: COLONOSCOPY WITH PROPOFOL;  Surgeon: Manya Silvas, MD;  Location[facility] ENDOSCOPY;  Service: Endoscopy;  Laterality: N/A;    COLONOSCOPY WITH PROPOFOL N/A 10/20/2018   Procedure: COLONOSCOPY WITH PROPOFOL;  Surgeon: Lollie Sails, MD;  Location: [facility] ENDOSCOPY;  Service: Endoscopy;  Laterality: N/A;   ESOPHAGOGASTRODUODENOSCOPY N/A 05/12/2021   Procedure: ESOPHAGOGASTRODUODENOSCOPY (EGD);  Surgeon: Lesly Rubenstein, MD;  Location: [facility] ENDOSCOPY;  Service: Endoscopy;  Laterality: N/A;   ESOPHAGOGASTRODUODENOSCOPY (EGD) WITH PROPOFOL N/A 07/19/2016   Procedure: ESOPHAGOGASTRODUODENOSCOPY (EGD) WITH PROPOFOL;  Surgeon: Manya Silvas, MD;  Location: [facility] ENDOSCOPY;  Service: Endoscopy;  Laterality: N/A;   FLEXIBLE SIGMOIDOSCOPY     KNEE ARTHROSCOPY  08/13/08   knee replacement and revision     left   RECTOCELE REPAIR     RIGHT/LEFT HEART CATH AND CORONARY ANGIOGRAPHY Bilateral 04/10/2018   Procedure:  RIGHT/LEFT HEART CATH AND CORONARY ANGIOGRAPHY;  Surgeon: Wellington Hampshire, MD;  Location: [place] CV LAB;  Service: Cardiovascular;  Laterality: Bilateral;   ROTATOR CUFF REPAIR     bilateral   SHOULDER SURGERY  11/17/05   TONSILLECTOMY  1962   VAGINAL HYSTERECTOMY  1974   abnormal pap and carcinoma in situ   Family History  Problem Relation Age of Onset   Diabetes Mellitus II Father    Thyroid disease Father    Alzheimer's disease Father    Breast cancer Maternal Aunt    Hypertension Mother    Heart Problems Brother    Leukemia Maternal Aunt    Alzheimer's disease Paternal Aunt    Alzheimer's disease Paternal Uncle    Alzheimer's disease Paternal Uncle    Alzheimer's disease Paternal Uncle    Alzheimer's disease Paternal Aunt    Colon cancer Neg Hx    Kidney cancer Neg Hx    Bladder Cancer Neg Hx    Social History   Socioeconomic History   Marital status: Married    Spouse name: Not on file   Number of children: Not on file   Years of education: Not on file   Highest education level: Not on file  Occupational History   Occupation: retired  Tobacco Use   Smoking status: Former     Packs/day: 1.00    Years: 15.00    Total pack years: 15.00    Types: Cigarettes    Quit date: 08/17/1983    Years since quitting: 38.4   Smokeless tobacco: Never  Vaping Use   Vaping Use: Never used  Substance and Sexual Activity   Alcohol use: Yes    Alcohol/week: 0.0 standard drinks of alcohol    Comment: rarely   Drug use: No   Sexual activity: Not on file  Other Topics Concern   Not on file  Social History Narrative   Lives at home with husband   Social Determinants of Health   Financial Resource Strain: Low Risk  (01/21/2022)   Overall Financial Resource Strain (CARDIA)    Difficulty of Paying Living Expenses: Not hard at all  Food Insecurity: No Food Insecurity (01/21/2022)   Hunger Vital Sign    Worried About Running Out of Food in the Last Year: Never true    Ran Out of Food in the Last Year: Never true  Transportation Needs: No Transportation Needs (01/21/2022)   PRAPARE - Hydrologist (Medical): No    Lack of Transportation (Non-Medical): No  Physical Activity: Inactive (01/21/2022)   Exercise Vital Sign    Days of Exercise per Week: 0 days    Minutes of Exercise per Session: 0 min  Stress: No Stress Concern Present (01/21/2022)   [place]    Feeling of Stress : Only a little  Social Connections: Unknown (01/21/2022)   Social Connection and Isolation Panel [NHANES]    Frequency of Communication with Friends and Family: Twice a week    Frequency of Social Gatherings with Friends and Family: Twice a week    Attends Religious Services: Not on Advertising copywriter or Organizations: Not on file    Attends Archivist Meetings: Not on file    Marital Status: Married    Tobacco Counseling Counseling given: Not Answered   Clinical Intake:  Pre-visit preparation completed: Yes        Diabetes: No  How often do you need to have someone help you when you  read instructions, pamphlets, or other written materials from your doctor or pharmacy?: 1 - Never   Interpreter Needed?: No    Activities of Daily Living    01/21/2022    2:53 PM 01/27/2021   10:48 AM  In your present state of health, do you have any difficulty performing the following activities:  Hearing? 0 0  Vision? 0 0  Difficulty concentrating or making decisions? 0 0  Walking or climbing stairs? 0 0  Dressing or bathing? 0 0  Doing errands, shopping? 0   Preparing Food and eating ? N   Using the Toilet? N   In the past six months, have you accidently leaked urine? N   Do you have problems with loss of bowel control? N   Managing your Medications? N   Comment Pill box in use   Managing your Finances? Y   Comment Husband manages   Housekeeping or managing your Housekeeping? N     Patient Care Team: Einar Pheasant, MD as PCP - General (Internal Medicine) Wellington Hampshire, MD as PCP - Cardiology (Cardiology) Earlie Server, MD as Consulting Physician (Hematology and Oncology)  Indicate any recent Medical Services you may have received from other than [facility] providers in the past year (date may be approximate).     Assessment:   This is a routine wellness examination for Bangs.  Virtual Visit via Telephone Note  I connected with  Paislynn Hegstrom Marquis on 01/21/22 at  2:30 PM EDT by telephone and verified that I am speaking with the correct person using two identifiers.  Persons participating in the virtual visit: patient/Nurse Health Advisor   I discussed the limitations of performing an evaluation and management service by telehealth. We continued and completed visit with audio only. Some vital signs may be absent or patient reported.   Hearing/Vision screen Hearing Screening - Comments:: Patient is able to hear conversational tones without difficulty.  No issues reported. Vision Screening - Comments:: Wears corrective lenses  They have seen their ophthalmologist in the last  12 months. Cataract extracted, bilateral  Dietary issues and exercise activities discussed: Current Exercise Habits: The patient does not participate in regular exercise at present, Intensity: Mild Regular diet Good water intake     Goals Addressed             This Visit's Progress    DIET - REDUCE SUGAR INTAKE         Depression Screen    01/21/2022    2:48 PM 12/01/2021   10:02 AM 05/18/2021   12:00 PM 06/05/2020    1:55 PM 02/15/2020   11:28 AM 06/21/2019    8:29 AM 12/28/2016    2:12 PM  PHQ 2/9 Scores  PHQ - 2 Score 0 0 0 0 0 0 2  PHQ- 9 Score       10    Fall Risk    01/21/2022    2:52 PM 12/01/2021   10:02 AM 05/18/2021   12:00 PM 06/05/2020    1:55 PM 02/15/2020   10:38 AM  Fall Risk   Falls in the past year? 0 0 0 0 1  Comment None since last reported 3 months ago      Number falls in past yr:   0 0 1  Injury with Fall?   0  0  Risk for fall due to :  No Fall Risks  Follow up Falls evaluation completed Falls evaluation completed Falls evaluation completed Falls evaluation completed Falls evaluation completed    [place]: Home free of loose throw rugs in walkways, pet beds, electrical cords, etc? Yes  Adequate lighting in your home to reduce risk of falls? Yes   ASSISTIVE DEVICES UTILIZED TO PREVENT FALLS: Life alert? No  Use of a cane, walker or w/c? Yes  Grab bars in the bathroom? Yes  Shower chair or bench in shower? No  Elevated toilet seat or a handicapped toilet? No   TIMED UP AND GO: Was the test performed? No .   Cognitive Function:  Patient is alert and oriented x3.  Enjoys brain health games and activities.  Followed by Neurology. Last OV 11/2021.      01/21/2022    2:59 PM  6CIT Screen  What Year? 0 points  What month? 0 points  What time? 0 points  Count back from 20 0 points  Months in reverse 0 points    Immunizations Immunization History  Administered Date(s) Administered   Fluad Quad(high  Dose 65+) 06/26/2019, 07/08/2020, 05/18/2021   Influenza, High Dose Seasonal PF 09/30/2017   Influenza,inj,Quad PF,6+ Mos 06/18/2013   Influenza-Unspecified 04/30/2014, 06/21/2016   PFIZER(Purple Top)SARS-COV-2 Vaccination 12/19/2019, 01/16/2020, 12/28/2020   Pneumococcal Polysaccharide-23 06/26/2019   Zoster Recombinat (Shingrix) 12/28/2020   Screening Tests Health Maintenance  Topic Date Due   COVID-19 Vaccine (4 - Booster for Pfizer series) 02/06/2022 (Originally 02/22/2021)   Zoster Vaccines- Shingrix (2 of 2) 03/02/2022 (Originally 02/22/2021)   TETANUS/TDAP  05/18/2022 (Originally 04/19/1969)   Pneumonia Vaccine 36+ Years old (2 - PCV) 08/25/2022 (Originally 06/25/2020)   MAMMOGRAM  02/27/2022   INFLUENZA VACCINE  03/16/2022   COLONOSCOPY (Pts 45-29yr Insurance coverage will need to be confirmed)  10/19/2028   DEXA SCAN  Completed   Hepatitis C Screening  Completed   HPV VACCINES  Aged Out   Health Maintenance There are no preventive care reminders to display for this patient.  Lung Cancer Screening: (Low Dose CT Chest recommended if Age 72-80years, 30 pack-year currently smoking OR have quit w/in 15years.) does not qualify.   Vision Screening: Recommended annual ophthalmology exams for early detection of glaucoma and other disorders of the eye.  Dental Screening: Recommended annual dental exams for proper oral hygiene. Upper partial denture.   Community Resource Referral / Chronic Care Management: CRR required this visit?  No   CCM required this visit?  No      Plan:   Keep all routine maintenance appointments.   I have personally reviewed and noted the following in the patient's chart:   Medical and social history Use of alcohol, tobacco or illicit drugs  Current medications and supplements including opioid prescriptions.  Functional ability and status Nutritional status Physical activity Advanced directives List of other physicians Hospitalizations,  surgeries, and ER visits in previous 12 months Vitals Screenings to include cognitive, depression, and falls Referrals and appointments  In addition, I have reviewed and discussed with patient certain preventive protocols, quality metrics, and best practice recommendations. A written personalized care plan for preventive services as well as general preventive health recommendations were provided to patient.     OVarney Biles LPN   [id number]

## 2022-02-01 ENCOUNTER — Other Ambulatory Visit: Payer: Self-pay | Admitting: Podiatry

## 2022-02-01 DIAGNOSIS — M2022 Hallux rigidus, left foot: Secondary | ICD-10-CM | POA: Diagnosis not present

## 2022-02-01 DIAGNOSIS — G8918 Other acute postprocedural pain: Secondary | ICD-10-CM | POA: Diagnosis not present

## 2022-02-01 DIAGNOSIS — S86012A Strain of left Achilles tendon, initial encounter: Secondary | ICD-10-CM | POA: Diagnosis not present

## 2022-02-01 DIAGNOSIS — M216X2 Other acquired deformities of left foot: Secondary | ICD-10-CM | POA: Diagnosis not present

## 2022-02-01 DIAGNOSIS — M7662 Achilles tendinitis, left leg: Secondary | ICD-10-CM | POA: Diagnosis not present

## 2022-02-01 MED ORDER — OXYCODONE-ACETAMINOPHEN 5-325 MG PO TABS: 1.0000 | ORAL_TABLET | ORAL | 0 refills | Status: DC | PRN

## 2022-02-02 ENCOUNTER — Other Ambulatory Visit: Payer: Self-pay | Admitting: Internal Medicine

## 2022-02-03 ENCOUNTER — Telehealth: Payer: Self-pay | Admitting: *Deleted

## 2022-02-03 ENCOUNTER — Ambulatory Visit (INDEPENDENT_AMBULATORY_CARE_PROVIDER_SITE_OTHER): Payer: Medicare Other | Admitting: *Deleted

## 2022-02-03 DIAGNOSIS — M21862 Other specified acquired deformities of left lower leg: Secondary | ICD-10-CM

## 2022-02-03 DIAGNOSIS — M7662 Achilles tendinitis, left leg: Secondary | ICD-10-CM

## 2022-02-03 DIAGNOSIS — M9262 Juvenile osteochondrosis of tarsus, left ankle: Secondary | ICD-10-CM

## 2022-02-03 DIAGNOSIS — Z9889 Other specified postprocedural states: Secondary | ICD-10-CM

## 2022-02-03 NOTE — Telephone Encounter (Signed)
Patient scheduled.

## 2022-02-03 NOTE — Progress Notes (Signed)
Patient presents to the office today for a dressing change.  She is a patient of Dr. Posey Pronto, had surgery on Monday, 02/01/2022 for a achilles tendon repair left foot.   She called the [facility] office this afternoon because her bandages were blood soaked and was leaking into her boot.   Incision was slightly macerated, but intact. No signs of infection. She was non-weight bearing using her knee scooter.   I redressed both achilles incision and gastroc incision in a DSD with an acewrap. She was concerned about the boot being wet. I did offer her another boot, but she didn't want additional charges. I wrapped her foot in a plastic bag to protect her bandages. She states she will wash the liner when she gets home.   She will reappointment with Dr. Posey Pronto at her scheduled post op appointment.

## 2022-02-03 NOTE — Telephone Encounter (Signed)
Patient is calling because inside her boot and bandages are moist, drainage is dark brown, there is an odor. Recent surgery patient and she is concerned, want bandages changed today. Please advise.

## 2022-02-09 ENCOUNTER — Ambulatory Visit (INDEPENDENT_AMBULATORY_CARE_PROVIDER_SITE_OTHER): Payer: Medicare Other | Admitting: Podiatry

## 2022-02-09 ENCOUNTER — Other Ambulatory Visit: Payer: Self-pay | Admitting: Cardiovascular Disease

## 2022-02-09 DIAGNOSIS — M7662 Achilles tendinitis, left leg: Secondary | ICD-10-CM

## 2022-02-09 DIAGNOSIS — M21862 Other specified acquired deformities of left lower leg: Secondary | ICD-10-CM

## 2022-02-09 DIAGNOSIS — M9262 Juvenile osteochondrosis of tarsus, left ankle: Secondary | ICD-10-CM

## 2022-02-09 DIAGNOSIS — Z9889 Other specified postprocedural states: Secondary | ICD-10-CM

## 2022-02-09 MED ORDER — CLINDAMYCIN HCL 300 MG PO CAPS: 300.0000 mg | ORAL_CAPSULE | Freq: Three times a day (TID) | ORAL | 0 refills | Status: AC

## 2022-02-11 NOTE — Progress Notes (Signed)
Subjective:  Patient ID: Nicole Sims, female    DOB: 1951-03-24,  MRN: [id number]  Chief Complaint  Patient presents with   Routine Post Op    POV #1 DOS 02/01/2022 LT ACHILLES TENDON REPAIR W/RESECTION OF HAGLUNDS DEFORMITY & GASTROCNEMIUS RECESSION W/FIXATION    DOS: 02/01/2022 Procedure: Left Achilles tendon repair with resection of Haglund's deformity gastrocnemius recession  71 y.o. female returns for post-op check.  Patient states she is doing okay she is having pain especially at the distal tip of the foot.  She has not been taking antibiotics there are some redness noted.  She has been nonweightbearing to the left lower extremity.  Review of Systems: Negative except as noted in the HPI. Denies N/V/F/Ch.  Past Medical History:  Diagnosis Date   Anemia    Anginal pain (HCC)    Anxiety    Asthma    Cataract cortical, senile    CHF (congestive heart failure) (HCC)    Chronic headaches    Diastolic heart failure (HCC)    Diverticulosis    Environmental allergies    GERD (gastroesophageal reflux disease)    Heart murmur    Hyperglycemia    Hyperlipidemia    Hypertension    Leukocytosis    Obesity    Osteoarthritis    Palpitations    Restless leg syndrome    Sleep apnea    Thrombocytosis    Urinary incontinence    mixed   Venous insufficiency    Vitamin D deficiency    Wears dentures    partial upper    Current Outpatient Medications:    clindamycin (CLEOCIN) 300 MG capsule, Take 1 capsule (300 mg total) by mouth 3 (three) times daily for 14 days., Disp: 42 capsule, Rfl: 0   acetaminophen (TYLENOL) 500 MG tablet, Take 500 mg by mouth every 6 (six) hours as needed., Disp: , Rfl:    AIMOVIG 140 MG/ML SOAJ, 140 Milligram(s) SUB-Q Once a Month, Disp: , Rfl:    albuterol (VENTOLIN HFA) 108 (90 Base) MCG/ACT inhaler, Inhale 2 puffs into the lungs every 4 (four) hours as needed for wheezing or shortness of breath., Disp: 1 each, Rfl: 0   aspirin 325 MG tablet, Take 325  mg by mouth daily., Disp: , Rfl:    baclofen (LIORESAL) 10 MG tablet, Take 10 mg by mouth 3 (three) times daily as needed for muscle spasms., Disp: , Rfl:    budesonide-formoterol (SYMBICORT) 80-4.5 MCG/ACT inhaler, Inhale 2 puffs into the lungs 2 (two) times daily., Disp: 1 each, Rfl: 0   Cholecalciferol (VITAMIN D-3) 1000 units CAPS, Take 1,000 Units by mouth daily., Disp: , Rfl:    fluticasone (FLONASE) 50 MCG/ACT nasal spray, Place 2 sprays into both nostrils daily., Disp: 48 g, Rfl: 3   furosemide (LASIX) 20 MG tablet, Take 1 tablet (20 mg) by mouth once daily as needed for weight gain of 3 lbs or more overnight, Disp: , Rfl:    linaclotide (LINZESS) 290 MCG CAPS capsule, Take 290 mcg by mouth daily before breakfast., Disp: , Rfl:    metoprolol succinate (TOPROL-XL) 100 MG 24 hr tablet, TAKE 1 TABLET BY MOUTH DAILY  WITH OR IMMEDIATLEY FOLLOWING A  MEAL, Disp: 90 tablet, Rfl: 0   nystatin (MYCOSTATIN) 100000 UNIT/ML suspension, 5 mls - swish and spit tid., Disp: 60 mL, Rfl: 0   Omega-3 Fatty Acids (FISH OIL) 1000 MG CAPS, Take 1,000 mg by mouth., Disp: , Rfl:    ondansetron (ZOFRAN) 4 MG  tablet, Take 1 tablet (4 mg total) by mouth 2 (two) times daily as needed for nausea or vomiting., Disp: 15 tablet, Rfl: 0   oxyCODONE-acetaminophen (PERCOCET) 5-325 MG tablet, Take 1 tablet by mouth every 4 (four) hours as needed for severe pain., Disp: 30 tablet, Rfl: 0   potassium chloride (KLOR-CON) 10 MEQ tablet, Take 1 tablet (10 meq) by mouth once daily as needed only when taking lasix (furosemide), Disp: , Rfl:    pregabalin (LYRICA) 50 MG capsule, Take by mouth. Take 1 capsule by mouth in the morning and 2 capsules at night., Disp: , Rfl:    RABEprazole (ACIPHEX) 20 MG tablet, Take 20 mg by mouth 2 (two) times daily., Disp: , Rfl:    REPATHA 140 MG/ML SOSY, INJECT 140 MG UNDER THE SKIN EVERY 14 DAYS, Disp: 2 mL, Rfl: 11   sucralfate (CARAFATE) 1 g tablet, Take 1 g by mouth daily as needed (GI sympotms).  , Disp: , Rfl:    venlafaxine XR (EFFEXOR-XR) 150 MG 24 hr capsule, TAKE 1 CAPSULE BY MOUTH  DAILY WITH BREAKFAST, Disp: 90 capsule, Rfl: 3   vitamin B-12 (CYANOCOBALAMIN) 1000 MCG tablet, Take 1,000 mcg by mouth daily., Disp: , Rfl:   Social History   Tobacco Use  Smoking Status Former   Packs/day: 1.00   Years: 15.00   Total pack years: 15.00   Types: Cigarettes   Quit date: 08/17/1983   Years since quitting: 38.5  Smokeless Tobacco Never    Allergies  Allergen Reactions   Levaquin [Levofloxacin]    Augmentin [Amoxicillin-Pot Clavulanate] Other (See Comments)    Questionable itching   Bactrim [Sulfamethoxazole-Trimethoprim] Rash   Doxycycline Itching   Hydrocodone-Acetaminophen Other (See Comments)    GI distress   Pseudoephedrine Rash    Itching of the scalp   Shellfish Allergy Nausea And Vomiting    Nausea and vomiting    Objective:  There were no vitals filed for this visit. There is no height or weight on file to calculate BMI. Constitutional Well developed. Well nourished.  Vascular Foot warm and well perfused. Capillary refill normal to all digits.   Neurologic Normal speech. Oriented to person, place, and time. Epicritic sensation to light touch grossly present bilaterally.  Dermatologic Skin healing well without signs of infection. Skin edges well coapted without signs of infection.  Superficial Deis is noted of the distal incision.  Granular wound bed noted no exposure of hardware noted  Orthopedic: Tenderness to palpation noted about the surgical site.   Radiographs: None Assessment:   1. Achilles tendinitis, left leg   2. Haglund's deformity, left   3. Gastrocnemius equinus, left   4. Status post left foot surgery    Plan:  Patient was evaluated and treated and all questions answered.  S/p foot surgery left -Progressing as expected post-operatively. -XR: See above -WB Status: Nonweightbearing in left lower extremity knee scooter -Sutures: Intact.   Superficial Deis is noted to the distal aspect of the incision the rest is well coapted -Medications: Clindamycin 300 due to allergies -Begin doing Betadine wet-to-dry dressing changes  No follow-ups on file.

## 2022-02-12 ENCOUNTER — Telehealth: Payer: Self-pay | Admitting: *Deleted

## 2022-02-12 NOTE — Telephone Encounter (Signed)
Spoke with patient giving recommendations/ instructions, verbalized understanding

## 2022-02-12 NOTE — Telephone Encounter (Signed)
Patient's husband has noticed  while changing her dressing l day ago a few stitches that had come out from heel area, little pus in the area. She is on an antibiotic but is concerned. Upcoming appointment on the 11th. Please advise.

## 2022-02-17 ENCOUNTER — Ambulatory Visit (INDEPENDENT_AMBULATORY_CARE_PROVIDER_SITE_OTHER): Payer: Medicare Other | Admitting: Podiatry

## 2022-02-17 ENCOUNTER — Encounter: Payer: Self-pay | Admitting: Podiatry

## 2022-02-17 DIAGNOSIS — M21862 Other specified acquired deformities of left lower leg: Secondary | ICD-10-CM

## 2022-02-17 DIAGNOSIS — Z9889 Other specified postprocedural states: Secondary | ICD-10-CM

## 2022-02-17 DIAGNOSIS — M7662 Achilles tendinitis, left leg: Secondary | ICD-10-CM

## 2022-02-17 DIAGNOSIS — T8131XA Disruption of external operation (surgical) wound, not elsewhere classified, initial encounter: Secondary | ICD-10-CM

## 2022-02-17 DIAGNOSIS — M9262 Juvenile osteochondrosis of tarsus, left ankle: Secondary | ICD-10-CM

## 2022-02-17 NOTE — Progress Notes (Addendum)
Ms. Linson presents with her son today date of surgery 02/01/2022 by Dr. Posey Pronto.  Status post Achilles tendon repair with resection of Haglund's deformity and a gastroc recession.  She denies fever chills nausea vomiting muscle aches and pains.  States that she has fallen several times.  She states that she does not walk on the foot other than putting foot down to maintain balance.  She is concerned about the wound being open and not healing.  She is also questioning whether or not this is going to leave a bad scar on the posterior aspect of her heel and why was the wound not sewn back together at her first visit.  She goes on to say that she was still heavily under the influence of medication/anesthesia when she got home and experienced a face forward fall and having to put her foot down hard that she feels that may have initiated part of this dehiscence.  She states that her pain level is the same as it was prior to surgery and that this has been a very bad experience for her.  Objective: Presents today with a knee scooter cam boot intact once removed demonstrates home dressed iodine bandage as directed by Dr. Posey Pronto.  No erythema cellulitis drainage or odor small piece of necrotic tissue was resected no purulence no signs of infection I removed the staples from the gastroc recession and I redressed that wound.  I also removed 3 staples from the inferior dehisced wound inferior aspect of the Achilles repair.  She has good plantarflexion against resistance.  Moderate swelling around the Achilles and into the incision site but no signs of infection  Assessment mild dehiscence of distal wound.  This does not appear to be anything out of the ordinary for this area.  It is granulating in and there is epithelialization.  Plan: I went ahead and redressed the wounds today placed her back in her cam walker encouraged nonweightbearing stance and we will follow-up with her in 1 week with Dr. Posey Pronto.  I explained to her  why we could not really stitch the wound.  Nurse stated that she took several steps with her cam boot to get onto her knee scooter.

## 2022-02-23 ENCOUNTER — Encounter: Payer: Self-pay | Admitting: Podiatry

## 2022-02-23 ENCOUNTER — Ambulatory Visit (INDEPENDENT_AMBULATORY_CARE_PROVIDER_SITE_OTHER): Payer: Medicare Other | Admitting: Podiatry

## 2022-02-23 ENCOUNTER — Other Ambulatory Visit: Payer: Self-pay | Admitting: Internal Medicine

## 2022-02-23 DIAGNOSIS — M7662 Achilles tendinitis, left leg: Secondary | ICD-10-CM

## 2022-02-23 DIAGNOSIS — T8131XA Disruption of external operation (surgical) wound, not elsewhere classified, initial encounter: Secondary | ICD-10-CM

## 2022-02-23 DIAGNOSIS — M9262 Juvenile osteochondrosis of tarsus, left ankle: Secondary | ICD-10-CM

## 2022-02-23 DIAGNOSIS — M21862 Other specified acquired deformities of left lower leg: Secondary | ICD-10-CM

## 2022-02-23 MED ORDER — OXYCODONE-ACETAMINOPHEN 5-325 MG PO TABS
1.0000 | ORAL_TABLET | ORAL | 0 refills | Status: DC | PRN
Start: 2022-02-23 — End: 2022-03-23

## 2022-02-23 NOTE — Progress Notes (Signed)
Subjective:  Patient ID: Nicole Sims, female    DOB: [birth year],  MRN: [id number]  Chief Complaint  Patient presents with   Routine Post Op    "It's doing better.  I hope he takes the staples out today."    DOS: 02/01/2022 Procedure: Left Achilles tendon repair with resection of Haglund's deformity gastrocnemius recession  72 y.o. female returns for post-op check.  Patient states she is doing okay she is having pain especially at the distal tip of the foot.  She is doing okay.  She has been nonweightbearing with a knee scooter.  Review of Systems: Negative except as noted in the HPI. Denies N/V/F/Ch.  Past Medical History:  Diagnosis Date   Anemia    Anginal pain (HCC)    Anxiety    Asthma    Cataract cortical, senile    CHF (congestive heart failure) (HCC)    Chronic headaches    Diastolic heart failure (HCC)    Diverticulosis    Environmental allergies    GERD (gastroesophageal reflux disease)    Heart murmur    Hyperglycemia    Hyperlipidemia    Hypertension    Leukocytosis    Obesity    Osteoarthritis    Palpitations    Restless leg syndrome    Sleep apnea    Thrombocytosis    Urinary incontinence    mixed   Venous insufficiency    Vitamin D deficiency    Wears dentures    partial upper    Current Outpatient Medications:    oxyCODONE-acetaminophen (PERCOCET) 5-325 MG tablet, Take 1 tablet by mouth every 4 (four) hours as needed for severe pain., Disp: 30 tablet, Rfl: 0   acetaminophen (TYLENOL) 500 MG tablet, Take 500 mg by mouth every 6 (six) hours as needed., Disp: , Rfl:    AIMOVIG 140 MG/ML SOAJ, 140 Milligram(s) SUB-Q Once a Month, Disp: , Rfl:    albuterol (VENTOLIN HFA) 108 (90 Base) MCG/ACT inhaler, Inhale 2 puffs into the lungs every 4 (four) hours as needed for wheezing or shortness of breath., Disp: 1 each, Rfl: 0   aspirin 325 MG tablet, Take 325 mg by mouth daily., Disp: , Rfl:    baclofen (LIORESAL) 10 MG tablet, Take 10 mg by mouth 3 (three)  times daily as needed for muscle spasms., Disp: , Rfl:    budesonide-formoterol (SYMBICORT) 80-4.5 MCG/ACT inhaler, Inhale 2 puffs into the lungs 2 (two) times daily., Disp: 1 each, Rfl: 0   Cholecalciferol (VITAMIN D-3) 1000 units CAPS, Take 1,000 Units by mouth daily., Disp: , Rfl:    clindamycin (CLEOCIN) 300 MG capsule, Take 1 capsule (300 mg total) by mouth 3 (three) times daily for 14 days., Disp: 42 capsule, Rfl: 0   fluticasone (FLONASE) 50 MCG/ACT nasal spray, USE 2 SPRAYS IN BOTH  NOSTRILS DAILY AS DIRECTED, Disp: 64 g, Rfl: 3   furosemide (LASIX) 20 MG tablet, Take 1 tablet (20 mg) by mouth once daily as needed for weight gain of 3 lbs or more overnight, Disp: , Rfl:    linaclotide (LINZESS) 290 MCG CAPS capsule, Take 290 mcg by mouth daily before breakfast., Disp: , Rfl:    metoprolol succinate (TOPROL-XL) 100 MG 24 hr tablet, TAKE 1 TABLET BY MOUTH DAILY  WITH OR IMMEDIATLEY FOLLOWING A  MEAL, Disp: 90 tablet, Rfl: 0   nystatin (MYCOSTATIN) 100000 UNIT/ML suspension, 5 mls - swish and spit tid., Disp: 60 mL, Rfl: 0   Omega-3 Fatty Acids (FISH OIL) 1000  MG CAPS, Take 1,000 mg by mouth., Disp: , Rfl:    ondansetron (ZOFRAN) 4 MG tablet, Take 1 tablet (4 mg total) by mouth 2 (two) times daily as needed for nausea or vomiting., Disp: 15 tablet, Rfl: 0   oxyCODONE-acetaminophen (PERCOCET) 5-325 MG tablet, Take 1 tablet by mouth every 4 (four) hours as needed for severe pain., Disp: 30 tablet, Rfl: 0   potassium chloride (KLOR-CON) 10 MEQ tablet, Take 1 tablet (10 meq) by mouth once daily as needed only when taking lasix (furosemide), Disp: , Rfl:    pregabalin (LYRICA) 50 MG capsule, Take by mouth. Take 1 capsule by mouth in the morning and 2 capsules at night., Disp: , Rfl:    RABEprazole (ACIPHEX) 20 MG tablet, Take 20 mg by mouth 2 (two) times daily., Disp: , Rfl:    REPATHA 140 MG/ML SOSY, INJECT 140 MG UNDER THE SKIN EVERY 14 DAYS, Disp: 2 mL, Rfl: 11   sucralfate (CARAFATE) 1 g tablet,  Take 1 g by mouth daily as needed (GI sympotms). , Disp: , Rfl:    venlafaxine XR (EFFEXOR-XR) 150 MG 24 hr capsule, TAKE 1 CAPSULE BY MOUTH  DAILY WITH BREAKFAST, Disp: 90 capsule, Rfl: 3   vitamin B-12 (CYANOCOBALAMIN) 1000 MCG tablet, Take 1,000 mcg by mouth daily., Disp: , Rfl:   Social History   Tobacco Use  Smoking Status Former   Packs/day: 1.00   Years: 15.00   Total pack years: 15.00   Types: Cigarettes   Quit date: 08/17/1983   Years since quitting: 38.5  Smokeless Tobacco Never    Allergies  Allergen Reactions   Levaquin [Levofloxacin]    Augmentin [Amoxicillin-Pot Clavulanate] Other (See Comments)    Questionable itching   Bactrim [Sulfamethoxazole-Trimethoprim] Rash   Doxycycline Itching   Hydrocodone-Acetaminophen Other (See Comments)    GI distress   Pseudoephedrine Rash    Itching of the scalp   Shellfish Allergy Nausea And Vomiting    Nausea and vomiting    Objective:  There were no vitals filed for this visit. There is no height or weight on file to calculate BMI. Constitutional Well developed. Well nourished.  Vascular Foot warm and well perfused. Capillary refill normal to all digits.   Neurologic Normal speech. Oriented to person, place, and time. Epicritic sensation to light touch grossly present bilaterally.  Dermatologic Skin healing well without signs of infection. Skin edges well coapted without signs of infection.  Superficial Deis is noted of the distal incision.  Granular wound bed noted no exposure of hardware noted  Orthopedic: Tenderness to palpation noted about the surgical site.   Radiographs: None Assessment:   1. Achilles tendinitis, left leg   2. Haglund's deformity, left   3. Gastrocnemius equinus, left   4. Dehiscence of incision, initial encounter     Plan:  Patient was evaluated and treated and all questions answered.  S/p foot surgery left -Progressing as expected post-operatively. -XR: See above -WB Status:  Nonweightbearing in left lower extremity knee scooter.  Slowly begin to weight-bear as tolerated in 2 weeks -Sutures: Removed. -Medications: Percocet -Begin doing Betadine wet-to-dry dressing changes to superficial dehiscence measuring 1 cm x 0.5 cm x 0.2 cm  No follow-ups on file.

## 2022-02-25 ENCOUNTER — Other Ambulatory Visit (INDEPENDENT_AMBULATORY_CARE_PROVIDER_SITE_OTHER): Payer: Medicare Other

## 2022-02-25 DIAGNOSIS — D473 Essential (hemorrhagic) thrombocythemia: Secondary | ICD-10-CM | POA: Diagnosis not present

## 2022-02-25 DIAGNOSIS — E7800 Pure hypercholesterolemia, unspecified: Secondary | ICD-10-CM | POA: Diagnosis not present

## 2022-02-25 DIAGNOSIS — I10 Essential (primary) hypertension: Secondary | ICD-10-CM | POA: Diagnosis not present

## 2022-02-25 DIAGNOSIS — R739 Hyperglycemia, unspecified: Secondary | ICD-10-CM | POA: Diagnosis not present

## 2022-02-25 LAB — CBC WITH DIFFERENTIAL/PLATELET
Basophils Absolute: 0.1 10*3/uL (ref 0.0–0.1)
Basophils Relative: 0.9 % (ref 0.0–3.0)
Eosinophils Absolute: 0.3 10*3/uL (ref 0.0–0.7)
Eosinophils Relative: 3.9 % (ref 0.0–5.0)
HCT: 38.2 % (ref 36.0–46.0)
Hemoglobin: 12.5 g/dL (ref 12.0–15.0)
Lymphocytes Relative: 35.2 % (ref 12.0–46.0)
Lymphs Abs: 3 10*3/uL (ref 0.7–4.0)
MCHC: 32.8 g/dL (ref 30.0–36.0)
MCV: 88 fl (ref 78.0–100.0)
Monocytes Absolute: 0.7 10*3/uL (ref 0.1–1.0)
Monocytes Relative: 8.6 % (ref 3.0–12.0)
Neutro Abs: 4.3 10*3/uL (ref 1.4–7.7)
Neutrophils Relative %: 51.4 % (ref 43.0–77.0)
Platelets: 462 10*3/uL — ABNORMAL HIGH (ref 150.0–400.0)
RBC: 4.34 Mil/uL (ref 3.87–5.11)
RDW: 13.9 % (ref 11.5–15.5)
WBC: 8.4 10*3/uL (ref 4.0–10.5)

## 2022-02-25 LAB — BASIC METABOLIC PANEL
BUN: 14 mg/dL (ref 6–23)
CO2: 27 mEq/L (ref 19–32)
Calcium: 9.6 mg/dL (ref 8.4–10.5)
Chloride: 107 mEq/L (ref 96–112)
Creatinine, Ser: 1.15 mg/dL (ref 0.40–1.20)
GFR: 47.81 mL/min — ABNORMAL LOW (ref >60.00)
Glucose, Bld: 98 mg/dL (ref 70–99)
Potassium: 4.4 mEq/L (ref 3.5–5.1)
Sodium: 142 mEq/L (ref 135–145)

## 2022-02-25 LAB — LIPID PANEL
Cholesterol: 161 mg/dL (ref 0–200)
HDL: 58.9 mg/dL (ref >39.00)
LDL Cholesterol: 76 mg/dL (ref 0–99)
NonHDL: 101.89
Total CHOL/HDL Ratio: 3
Triglycerides: 127 mg/dL (ref 0.0–149.0)
VLDL: 25.4 mg/dL (ref 0.0–40.0)

## 2022-02-25 LAB — HEPATIC FUNCTION PANEL
ALT: 9 U/L (ref 0–35)
AST: 16 U/L (ref 0–37)
Albumin: 4.2 g/dL (ref 3.5–5.2)
Alkaline Phosphatase: 86 U/L (ref 39–117)
Bilirubin, Direct: 0.1 mg/dL (ref 0.0–0.3)
Total Bilirubin: 0.6 mg/dL (ref 0.2–1.2)
Total Protein: 7.5 g/dL (ref 6.0–8.3)

## 2022-02-25 LAB — HEMOGLOBIN A1C: Hgb A1c MFr Bld: 6 % (ref 4.6–6.5)

## 2022-02-25 LAB — TSH: TSH: 2.56 u[IU]/mL (ref 0.35–5.50)

## 2022-03-02 ENCOUNTER — Ambulatory Visit (INDEPENDENT_AMBULATORY_CARE_PROVIDER_SITE_OTHER): Payer: Medicare Other | Admitting: Internal Medicine

## 2022-03-02 ENCOUNTER — Encounter: Payer: Self-pay | Admitting: Internal Medicine

## 2022-03-02 VITALS — BP 120/80 | HR 73 | Temp 97.4°F | Resp 21 | Ht 65.0 in | Wt 237.0 lb

## 2022-03-02 DIAGNOSIS — M7660 Achilles tendinitis, unspecified leg: Secondary | ICD-10-CM | POA: Diagnosis not present

## 2022-03-02 DIAGNOSIS — I471 Supraventricular tachycardia, unspecified: Secondary | ICD-10-CM

## 2022-03-02 DIAGNOSIS — D649 Anemia, unspecified: Secondary | ICD-10-CM

## 2022-03-02 DIAGNOSIS — I10 Essential (primary) hypertension: Secondary | ICD-10-CM | POA: Diagnosis not present

## 2022-03-02 DIAGNOSIS — I700 Atherosclerosis of aorta: Secondary | ICD-10-CM | POA: Diagnosis not present

## 2022-03-02 DIAGNOSIS — D473 Essential (hemorrhagic) thrombocythemia: Secondary | ICD-10-CM | POA: Diagnosis not present

## 2022-03-02 DIAGNOSIS — L900 Lichen sclerosus et atrophicus: Secondary | ICD-10-CM

## 2022-03-02 DIAGNOSIS — E7800 Pure hypercholesterolemia, unspecified: Secondary | ICD-10-CM | POA: Diagnosis not present

## 2022-03-02 DIAGNOSIS — R739 Hyperglycemia, unspecified: Secondary | ICD-10-CM | POA: Diagnosis not present

## 2022-03-02 DIAGNOSIS — K219 Gastro-esophageal reflux disease without esophagitis: Secondary | ICD-10-CM | POA: Diagnosis not present

## 2022-03-02 DIAGNOSIS — I6509 Occlusion and stenosis of unspecified vertebral artery: Secondary | ICD-10-CM

## 2022-03-02 DIAGNOSIS — R059 Cough, unspecified: Secondary | ICD-10-CM

## 2022-03-02 DIAGNOSIS — F439 Reaction to severe stress, unspecified: Secondary | ICD-10-CM

## 2022-03-02 DIAGNOSIS — I5030 Unspecified diastolic (congestive) heart failure: Secondary | ICD-10-CM

## 2022-03-02 MED ORDER — AZITHROMYCIN 250 MG PO TABS: ORAL_TABLET | ORAL | 0 refills | Status: AC

## 2022-03-02 NOTE — Progress Notes (Signed)
Patient ID: Nicole Sims, female   DOB: 11/30/1950, 71 y.o.   MRN: [id number]   Subjective:    Patient ID: Nicole Sims, female    DOB: Jan 02, 1951, 70 y.o.   MRN: [id number]   Patient here for a scheduled follow up.   Chief Complaint  Patient presents with   Follow-up    3 month follow up   .   HPI Here to follow up regarding high blood pressure and hypercholesterolemia.  She is s/p left achilles tendon repair with resection of haglund's deformity gastrocnemius recession. Using knee scooter.  Followed by podiatry.  Just had sutures removed.  Bowels ok.  Stool softener.  No chest pain reported.  Does report starting two weeks ago - head congestion.  Some cough - green mucus production.  No sob reported.  Using CV tussin, flonase and saline nasal spray.  No vomiting.  Some occasional nausea.     Past Medical History:  Diagnosis Date   Anemia    Anginal pain (HCC)    Anxiety    Asthma    Cataract cortical, senile    CHF (congestive heart failure) (HCC)    Chronic headaches    Diastolic heart failure (HCC)    Diverticulosis    Environmental allergies    GERD (gastroesophageal reflux disease)    Heart murmur    Hyperglycemia    Hyperlipidemia    Hypertension    Leukocytosis    Obesity    Osteoarthritis    Palpitations    Restless leg syndrome    Sleep apnea    Thrombocytosis    Urinary incontinence    mixed   Venous insufficiency    Vitamin D deficiency    Wears dentures    partial upper   Past Surgical History:  Procedure Laterality Date   BLADDER SURGERY     x2   [place] [facility] [place]   BREAST CYST ASPIRATION Bilateral 2005   approximate year   CARDIAC CATHETERIZATION     [place]   CATARACT EXTRACTION W/PHACO Right 04/24/2020   Procedure: CATARACT EXTRACTION PHACO AND INTRAOCULAR LENS PLACEMENT ([place]) RIGHT;  Surgeon: Birder Robson, MD;  Location: [place];  Service: Ophthalmology;  Laterality: Right;  6.75 0:37.3   CATARACT EXTRACTION W/PHACO  Left 01/27/2021   Procedure: CATARACT EXTRACTION PHACO AND INTRAOCULAR LENS PLACEMENT ([place]) LEFT;  Surgeon: Birder Robson, MD;  Location: [place];  Service: Ophthalmology;  Laterality: Left;  6.75 00:43.9   CERVICAL CONE BIOPSY     CIS   CHOLECYSTECTOMY     COLONOSCOPY WITH PROPOFOL N/A 07/19/2016   Procedure: COLONOSCOPY WITH PROPOFOL;  Surgeon: Manya Silvas, MD;  Location: [facility] ENDOSCOPY;  Service: Endoscopy;  Laterality: N/A;   COLONOSCOPY WITH PROPOFOL N/A 10/20/2018   Procedure: COLONOSCOPY WITH PROPOFOL;  Surgeon: Lollie Sails, MD;  Location: [facility] ENDOSCOPY;  Service: Endoscopy;  Laterality: N/A;   ESOPHAGOGASTRODUODENOSCOPY N/A 05/12/2021   Procedure: ESOPHAGOGASTRODUODENOSCOPY (EGD);  Surgeon: Lesly Rubenstein, MD;  Location: [facility] ENDOSCOPY;  Service: Endoscopy;  Laterality: N/A;   ESOPHAGOGASTRODUODENOSCOPY (EGD) WITH PROPOFOL N/A 07/19/2016   Procedure: ESOPHAGOGASTRODUODENOSCOPY (EGD) WITH PROPOFOL;  Surgeon: Manya Silvas, MD;  Location: [facility] ENDOSCOPY;  Service: Endoscopy;  Laterality: N/A;   FLEXIBLE SIGMOIDOSCOPY     KNEE ARTHROSCOPY  08/13/08   knee replacement and revision     left   RECTOCELE REPAIR     RIGHT/LEFT HEART CATH AND CORONARY ANGIOGRAPHY Bilateral 04/10/2018   Procedure: RIGHT/LEFT HEART CATH AND CORONARY  ANGIOGRAPHY;  Surgeon: Wellington Hampshire, MD;  Location: [place] CV LAB;  Service: Cardiovascular;  Laterality: Bilateral;   ROTATOR CUFF REPAIR     bilateral   SHOULDER SURGERY  11/17/05   TONSILLECTOMY  1962   VAGINAL HYSTERECTOMY  1974   abnormal pap and carcinoma in situ   Family History  Problem Relation Age of Onset   Diabetes Mellitus II Father    Thyroid disease Father    Alzheimer's disease Father    Breast cancer Maternal Aunt    Hypertension Mother    Heart Problems Brother    Leukemia Maternal Aunt    Alzheimer's disease Paternal Aunt    Alzheimer's disease Paternal Uncle    Alzheimer's disease Paternal  Uncle    Alzheimer's disease Paternal Uncle    Alzheimer's disease Paternal Aunt    Colon cancer Neg Hx    Kidney cancer Neg Hx    Bladder Cancer Neg Hx    Social History   Socioeconomic History   Marital status: Married    Spouse name: Not on file   Number of children: Not on file   Years of education: Not on file   Highest education level: Not on file  Occupational History   Occupation: retired  Tobacco Use   Smoking status: Former    Packs/day: 1.00    Years: 15.00    Total pack years: 15.00    Types: Cigarettes    Quit date: 08/17/1983    Years since quitting: 38.5   Smokeless tobacco: Never  Vaping Use   Vaping Use: Never used  Substance and Sexual Activity   Alcohol use: Yes    Alcohol/week: 0.0 standard drinks of alcohol    Comment: rarely   Drug use: No   Sexual activity: Not on file  Other Topics Concern   Not on file  Social History Narrative   Lives at home with husband   Social Determinants of Health   Financial Resource Strain: Low Risk  (01/21/2022)   Overall Financial Resource Strain (CARDIA)    Difficulty of Paying Living Expenses: Not hard at all  Food Insecurity: No Food Insecurity (01/21/2022)   Hunger Vital Sign    Worried About Running Out of Food in the Last Year: Never true    Ran Out of Food in the Last Year: Never true  Transportation Needs: No Transportation Needs (01/21/2022)   PRAPARE - Hydrologist (Medical): No    Lack of Transportation (Non-Medical): No  Physical Activity: Inactive (01/21/2022)   Exercise Vital Sign    Days of Exercise per Week: 0 days    Minutes of Exercise per Session: 0 min  Stress: No Stress Concern Present (01/21/2022)   [place]    Feeling of Stress : Only a little  Social Connections: Unknown (01/21/2022)   Social Connection and Isolation Panel [NHANES]    Frequency of Communication with Friends and Family: Twice a week     Frequency of Social Gatherings with Friends and Family: Twice a week    Attends Religious Services: Not on Advertising copywriter or Organizations: Not on file    Attends Archivist Meetings: Not on file    Marital Status: Married     Review of Systems  Constitutional:  Negative for appetite change and unexpected weight change.  HENT:  Positive for congestion. Negative for sinus pressure.   Respiratory:  Positive for cough. Negative for chest tightness and shortness of breath.   Cardiovascular:  Negative for chest pain, palpitations and leg swelling.  Gastrointestinal:  Positive for nausea. Negative for abdominal pain, diarrhea and vomiting.  Genitourinary:  Negative for difficulty urinating and dysuria.  Musculoskeletal:  Negative for joint swelling and myalgias.  Skin:  Negative for color change and rash.  Neurological:  Negative for dizziness, light-headedness and headaches.  Psychiatric/Behavioral:  Negative for agitation and dysphoric mood.        Objective:     BP 120/80 (BP Location: Left Arm, Patient Position: Sitting, Cuff Size: Normal)   Pulse 73   Temp (!) 97.4 F (36.3 C) (Oral)   Resp (!) 21   Ht '5\' 5"'$  (1.651 m)   Wt 237 lb (107.5 kg)   SpO2 100%   BMI 39.44 kg/m  Wt Readings from Last 3 Encounters:  03/02/22 237 lb (107.5 kg)  01/21/22 238 lb (108 kg)  12/01/21 238 lb 6.4 oz (108.1 kg)    Physical Exam Vitals reviewed.  Constitutional:      General: She is not in acute distress.    Appearance: Normal appearance.  HENT:     Head: Normocephalic and atraumatic.     Right Ear: External ear normal.     Left Ear: External ear normal.  Eyes:     General: No scleral icterus.       Right eye: No discharge.        Left eye: No discharge.     Conjunctiva/sclera: Conjunctivae normal.  Neck:     Thyroid: No thyromegaly.  Cardiovascular:     Rate and Rhythm: Normal rate and regular rhythm.  Pulmonary:     Effort: No respiratory  distress.     Breath sounds: Normal breath sounds. No wheezing.  Abdominal:     General: Bowel sounds are normal.     Palpations: Abdomen is soft.     Tenderness: There is no abdominal tenderness.  Musculoskeletal:        General: No swelling or tenderness.     Cervical back: Neck supple. No tenderness.  Lymphadenopathy:     Cervical: No cervical adenopathy.  Skin:    Findings: No erythema or rash.  Neurological:     Mental Status: She is alert.  Psychiatric:        Mood and Affect: Mood normal.        Behavior: Behavior normal.      Outpatient Encounter Medications as of 03/02/2022  Medication Sig   acetaminophen (TYLENOL) 500 MG tablet Take 500 mg by mouth every 6 (six) hours as needed.   AIMOVIG 140 MG/ML SOAJ 140 Milligram(s) SUB-Q Once a Month   albuterol (VENTOLIN HFA) 108 (90 Base) MCG/ACT inhaler Inhale 2 puffs into the lungs every 4 (four) hours as needed for wheezing or shortness of breath.   aspirin 325 MG tablet Take 325 mg by mouth daily.   azithromycin (ZITHROMAX) 250 MG tablet Take 2 tablets on day 1, then 1 tablet daily on days 2 through 5   budesonide-formoterol (SYMBICORT) 80-4.5 MCG/ACT inhaler Inhale 2 puffs into the lungs 2 (two) times daily.   fluticasone (FLONASE) 50 MCG/ACT nasal spray USE 2 SPRAYS IN BOTH  NOSTRILS DAILY AS DIRECTED   furosemide (LASIX) 20 MG tablet Take 1 tablet (20 mg) by mouth once daily as needed for weight gain of 3 lbs or more overnight   linaclotide (LINZESS) 290 MCG CAPS capsule Take 290 mcg by mouth  daily before breakfast.   metoprolol succinate (TOPROL-XL) 100 MG 24 hr tablet TAKE 1 TABLET BY MOUTH DAILY  WITH OR IMMEDIATLEY FOLLOWING A  MEAL   Omega-3 Fatty Acids (FISH OIL) 1000 MG CAPS Take 1,000 mg by mouth.   ondansetron (ZOFRAN) 4 MG tablet Take 1 tablet (4 mg total) by mouth 2 (two) times daily as needed for nausea or vomiting.   oxyCODONE-acetaminophen (PERCOCET) 5-325 MG tablet Take 1 tablet by mouth every 4 (four) hours  as needed for severe pain.   oxyCODONE-acetaminophen (PERCOCET) 5-325 MG tablet Take 1 tablet by mouth every 4 (four) hours as needed for severe pain.   potassium chloride (KLOR-CON) 10 MEQ tablet Take 1 tablet (10 meq) by mouth once daily as needed only when taking lasix (furosemide)   pregabalin (LYRICA) 100 MG capsule Take 100 mg by mouth at bedtime.   pregabalin (LYRICA) 50 MG capsule Take by mouth. Take 1 capsule by mouth in the morning and 1 capsules at afternoon   RABEprazole (ACIPHEX) 20 MG tablet Take 20 mg by mouth 2 (two) times daily.   REPATHA 140 MG/ML SOSY INJECT 140 MG UNDER THE SKIN EVERY 14 DAYS   sucralfate (CARAFATE) 1 g tablet Take 1 g by mouth daily as needed (GI sympotms).    venlafaxine XR (EFFEXOR-XR) 150 MG 24 hr capsule TAKE 1 CAPSULE BY MOUTH  DAILY WITH BREAKFAST   [DISCONTINUED] baclofen (LIORESAL) 10 MG tablet Take 10 mg by mouth 3 (three) times daily as needed for muscle spasms.   [DISCONTINUED] Cholecalciferol (VITAMIN D-3) 1000 units CAPS Take 1,000 Units by mouth daily.   [DISCONTINUED] nystatin (MYCOSTATIN) 100000 UNIT/ML suspension 5 mls - swish and spit tid.   [DISCONTINUED] vitamin B-12 (CYANOCOBALAMIN) 1000 MCG tablet Take 1,000 mcg by mouth daily.   No facility-administered encounter medications on file as of 03/02/2022.     Lab Results  Component Value Date   WBC 8.4 02/25/2022   HGB 12.5 02/25/2022   HCT 38.2 02/25/2022   PLT 462.0 (H) 02/25/2022   GLUCOSE 98 02/25/2022   CHOL 161 02/25/2022   TRIG 127.0 02/25/2022   HDL 58.90 02/25/2022   LDLDIRECT 172.8 09/17/2013   LDLCALC 76 02/25/2022   ALT 9 02/25/2022   AST 16 02/25/2022   NA 142 02/25/2022   K 4.4 02/25/2022   CL 107 02/25/2022   CREATININE 1.15 02/25/2022   BUN 14 02/25/2022   CO2 27 02/25/2022   TSH 2.56 02/25/2022   INR 1.0 06/11/2019   HGBA1C 6.0 02/25/2022   MICROALBUR 1.1 04/28/2015    NM Bone Scan 3 Phase  Result Date: 01/12/2022 CLINICAL DATA:  Right knee  arthroplasty 2014, left knee arthroplasty 2009, trauma March 2023 2 right knee, pain and swelling after fall EXAM: NUCLEAR MEDICINE 3-PHASE BONE SCAN TECHNIQUE: Radionuclide angiographic images, immediate static blood pool images, and 3-hour delayed static images were obtained of the bilateral knees after intravenous injection of radiopharmaceutical. RADIOPHARMACEUTICALS:  20.05 mCi Tc-75mMDP IV COMPARISON:  None Available. FINDINGS: Vascular phase: Normal symmetrical distribution of radiotracer on vascular phase imaging. Blood pool phase: Photopenia related to bilateral knee arthroplasties. No abnormal radiotracer accumulation. Delayed phase: Photopenia related to bilateral knee arthroplasties. Symmetrical radiotracer uptake surrounding the bilateral knee arthroplasties consistent with postsurgical change. No abnormal activity to suggest an acute or destructive process. IMPRESSION: 1. Normal postoperative appearance of bilateral knee arthroplasties. No evidence of loosening or infection. Electronically Signed   By: MRanda NgoM.D.   On: 01/12/2022 15:45  Assessment & Plan:   Problem List Items Addressed This Visit     Achilles tendon pain    She is s/p left achilles tendon repair with resection of haglund's deformity gastrocnemius recession. Using knee scooter.  Followed by podiatry.  Just had sutures removed. Followed by podiatry.       Anemia    Follow cbc. Previous iron panel showed adequate iron stores per hematology.        Aortic atherosclerosis ([place])    On repatha.  Continue.       Cough    Symptoms as outlined - cough and congestion.  Treat with zpak as directed.  Continue saline nasal spray/steroid nasal spray.  Robitussin DM or delsym if needed.  Has albuterol inhaler to use prn.  Probiotics as directed.  Call with update.       Diastolic heart failure (HCC)    History of diastolic dysfunction.  EF 60-65%.  No increased sob reported.  Follow. Continue metoprolol.        Essential (hemorrhagic) thrombocythemia ([place])    Worked up previously by hematology.  Follow cbc.       Relevant Medications   azithromycin (ZITHROMAX) 250 MG tablet   Other Relevant Orders   CBC with Differential/Platelet   Essential hypertension - Primary    Continue toprol.  Blood pressure doing well.  Follow blood pressure.  Follow metabolic panel.       Relevant Orders   Basic Metabolic Panel (BMET)   Gastroesophageal reflux disease    Continue aciphex.        Hypercholesteremia    On repatha and doing well.  Follow.        Relevant Orders   Hepatic function panel   Lipid Profile   Hyperglycemia   Relevant Orders   HgB U6J   Lichen sclerosus    Saw gyn 05/2021.  PAP negative.  Continue f/u with gyn.       PSVT (paroxysmal supraventricular tachycardia) ([place])    Has a history of PSVT.  Has seen Dr Fletcher Anon.  On metoprolol.  No increased heart rate or palpitations reported.  Continue metoprolol.        Stenosis of vertebral artery without cerebral infarction    Continue aspirin and repatha.       Stress    Continue effexor. Appears to be stable.         Einar Pheasant, MD

## 2022-03-07 ENCOUNTER — Encounter: Payer: Self-pay | Admitting: Internal Medicine

## 2022-03-07 DIAGNOSIS — R059 Cough, unspecified: Secondary | ICD-10-CM | POA: Insufficient documentation

## 2022-03-07 NOTE — Assessment & Plan Note (Signed)
She is s/p left achilles tendon repair with resection of haglund's deformity gastrocnemius recession. Using knee scooter.  Followed by podiatry.  Just had sutures removed. Followed by podiatry.

## 2022-03-07 NOTE — Assessment & Plan Note (Signed)
Has a history of PSVT.  Has seen Dr Arida.  On metoprolol.  No increased heart rate or palpitations reported.  Continue metoprolol.   

## 2022-03-07 NOTE — Assessment & Plan Note (Signed)
Continue toprol.  Blood pressure doing well.  Follow blood pressure.  Follow metabolic panel.  

## 2022-03-07 NOTE — Assessment & Plan Note (Signed)
Worked up previously by hematology.  Follow cbc.  

## 2022-03-07 NOTE — Assessment & Plan Note (Signed)
Continue aspirin and repatha.

## 2022-03-07 NOTE — Assessment & Plan Note (Signed)
Follow cbc. Previous iron panel showed adequate iron stores per hematology.

## 2022-03-07 NOTE — Assessment & Plan Note (Signed)
Continue aciphex.  

## 2022-03-07 NOTE — Assessment & Plan Note (Signed)
History of diastolic dysfunction.  EF 60-65%.  No increased sob reported.  Follow. Continue metoprolol.  

## 2022-03-07 NOTE — Assessment & Plan Note (Signed)
Saw gyn 05/2021.  PAP negative.  Continue f/u with gyn.  

## 2022-03-07 NOTE — Assessment & Plan Note (Signed)
Continue effexor. Appears to be stable.

## 2022-03-07 NOTE — Assessment & Plan Note (Signed)
On repatha.  Continue.  

## 2022-03-07 NOTE — Assessment & Plan Note (Signed)
Symptoms as outlined - cough and congestion.  Treat with zpak as directed.  Continue saline nasal spray/steroid nasal spray.  Robitussin DM or delsym if needed.  Has albuterol inhaler to use prn.  Probiotics as directed.  Call with update.

## 2022-03-07 NOTE — Assessment & Plan Note (Signed)
On repatha and doing well.  Follow.   

## 2022-03-22 ENCOUNTER — Other Ambulatory Visit: Payer: Self-pay

## 2022-03-22 ENCOUNTER — Telehealth: Payer: Self-pay | Admitting: Internal Medicine

## 2022-03-22 DIAGNOSIS — R300 Dysuria: Secondary | ICD-10-CM

## 2022-03-22 NOTE — Telephone Encounter (Signed)
LMTCB

## 2022-03-22 NOTE — Telephone Encounter (Signed)
Patient would like a referral to ENT and she prefers:  Carloyn Manner, MD  [facility]

## 2022-03-22 NOTE — Telephone Encounter (Signed)
Patient returned call. Judson Roch was not available at the time let patient know I will send message.

## 2022-03-22 NOTE — Progress Notes (Signed)
Why did Nicole Sims have urine studies ordered.  Is she having urinary issues?

## 2022-03-22 NOTE — Telephone Encounter (Signed)
I am ok to place the referral, but has she tried anything with this flare?  Need symptoms - any other symptoms?

## 2022-03-22 NOTE — Telephone Encounter (Signed)
S/w pt - thinks she is having thrush again. Stated her mouth and throat is sore and red with white splotches. Wanted ENT referral, but I advised pt I was not sure how soon they could see her. Offered appt here for pt to be evaluated -  Pt agreeable to see Dr Derrel Nip tomorrow at 1145. Still wants ENT referral.  Ok to send ?

## 2022-03-23 ENCOUNTER — Ambulatory Visit (INDEPENDENT_AMBULATORY_CARE_PROVIDER_SITE_OTHER): Payer: Medicare Other | Admitting: Internal Medicine

## 2022-03-23 VITALS — BP 130/70 | HR 76 | Temp 97.7°F | Ht 65.0 in | Wt 237.5 lb

## 2022-03-23 DIAGNOSIS — R1310 Dysphagia, unspecified: Secondary | ICD-10-CM

## 2022-03-23 DIAGNOSIS — J029 Acute pharyngitis, unspecified: Secondary | ICD-10-CM | POA: Diagnosis not present

## 2022-03-23 LAB — POCT RAPID STREP A (OFFICE): Rapid Strep A Screen: NEGATIVE

## 2022-03-23 MED ORDER — FLUCONAZOLE 150 MG PO TABS: ORAL_TABLET | ORAL | 0 refills | Status: DC

## 2022-03-23 MED ORDER — IPRATROPIUM BROMIDE 0.06 % NA SOLN: 2.0000 | Freq: Four times a day (QID) | NASAL | 12 refills | Status: DC

## 2022-03-23 NOTE — Progress Notes (Signed)
Canceled ordered.

## 2022-03-23 NOTE — Assessment & Plan Note (Addendum)
Accompanied by bilaeral cervical LAD.  Throat is clear but patient requesting strep test, which was negative.  Given her recent history  of candida esophagitis and continued use of flonase , Will treat for same and refer to ENT for evaluation

## 2022-03-23 NOTE — Progress Notes (Signed)
Labs ordered in error - were meant for different patient

## 2022-03-23 NOTE — Progress Notes (Signed)
Subjective:  Patient ID: Nicole Sims, female    DOB: 1950/11/26  Age: 71 y.o. MRN: [id number]  CC: The primary encounter diagnosis was Odynophagia. A diagnosis of Pharyngitis, unspecified etiology was also pertinent to this visit.   HPI Nicole Sims presents for pharyngitis   71 yr old female with history of mild asthma/RAD by prior pulmonary evaluation in 2019,  persistent post COVID cough  managed currently with only a steroid nasal spray, lasted treated for bronchitis with azithromycin  in July,  presents with a chronically sore throat that has been present for the last several months "pretty much the entire year" .  Accompanied by bilareral cervical LAD ,   PND but no rhinorrhea .  Has been using her ICS inconsistently,  none in the last month.  Also reports reflux managed with PPI used twice daily    History of candida esophagitis in Sept 2022,  treated by Dr Amaryllis Dyke with 14 days of fluconazole.  Symptoms are similar and she reports a history of strep throat as well that was accompanied by LAD .    Achilles tendon surgery in June by [facility] complicated by infection and slow healing  swearing a boot currenlty.   Outpatient Medications Prior to Visit  Medication Sig Dispense Refill   acetaminophen (TYLENOL) 500 MG tablet Take 500 mg by mouth every 6 (six) hours as needed.     AIMOVIG 140 MG/ML SOAJ 140 Milligram(s) SUB-Q Once a Month     albuterol (VENTOLIN HFA) 108 (90 Base) MCG/ACT inhaler Inhale 2 puffs into the lungs every 4 (four) hours as needed for wheezing or shortness of breath. 1 each 0   aspirin 325 MG tablet Take 325 mg by mouth daily.     budesonide-formoterol (SYMBICORT) 80-4.5 MCG/ACT inhaler Inhale 2 puffs into the lungs 2 (two) times daily. 1 each 0   fluticasone (FLONASE) 50 MCG/ACT nasal spray USE 2 SPRAYS IN BOTH  NOSTRILS DAILY AS DIRECTED 64 g 3   furosemide (LASIX) 20 MG tablet Take 1 tablet (20 mg) by mouth once daily as needed for weight gain of 3 lbs or  more overnight     linaclotide (LINZESS) 290 MCG CAPS capsule Take 290 mcg by mouth daily before breakfast.     metoprolol succinate (TOPROL-XL) 100 MG 24 hr tablet TAKE 1 TABLET BY MOUTH DAILY  WITH OR IMMEDIATLEY FOLLOWING A  MEAL 90 tablet 0   Omega-3 Fatty Acids (FISH OIL) 1000 MG CAPS Take 1,000 mg by mouth.     ondansetron (ZOFRAN) 4 MG tablet Take 1 tablet (4 mg total) by mouth 2 (two) times daily as needed for nausea or vomiting. 15 tablet 0   potassium chloride (KLOR-CON) 10 MEQ tablet Take 1 tablet (10 meq) by mouth once daily as needed only when taking lasix (furosemide)     pregabalin (LYRICA) 100 MG capsule Take 100 mg by mouth at bedtime.     pregabalin (LYRICA) 50 MG capsule Take by mouth. Take 1 capsule by mouth in the morning and 1 capsules at afternoon     RABEprazole (ACIPHEX) 20 MG tablet Take 20 mg by mouth 2 (two) times daily.     REPATHA 140 MG/ML SOSY INJECT 140 MG UNDER THE SKIN EVERY 14 DAYS 2 mL 11   sucralfate (CARAFATE) 1 g tablet Take 1 g by mouth daily as needed (GI sympotms).      venlafaxine XR (EFFEXOR-XR) 150 MG 24 hr capsule TAKE 1 CAPSULE BY MOUTH  DAILY WITH BREAKFAST 90 capsule 3   oxyCODONE-acetaminophen (PERCOCET) 5-325 MG tablet Take 1 tablet by mouth every 4 (four) hours as needed for severe pain. 30 tablet 0   oxyCODONE-acetaminophen (PERCOCET) 5-325 MG tablet Take 1 tablet by mouth every 4 (four) hours as needed for severe pain. 30 tablet 0   No facility-administered medications prior to visit.    Review of Systems;  Patient denies headache, fevers, malaise, unintentional weight loss, skin rash, eye pain, sinus congestion and sinus pain, sore throat, dysphagia,  hemoptysis , cough, dyspnea, wheezing, chest pain, palpitations, orthopnea, edema, abdominal pain, nausea, melena, diarrhea, constipation, flank pain, dysuria, hematuria, urinary  Frequency, nocturia, numbness, tingling, seizures,  Focal weakness, Loss of consciousness,  Tremor, insomnia,  depression, anxiety, and suicidal ideation.      Objective:  BP 130/70 (BP Location: Left Arm, Patient Position: Sitting, Cuff Size: Normal)   Pulse 76   Temp 97.7 F (36.5 C) (Oral)   Ht '5\' 5"'$  (1.651 m)   Wt 237 lb 8 oz (107.7 kg)   SpO2 99%   BMI 39.52 kg/m   BP Readings from Last 3 Encounters:  03/23/22 130/70  03/02/22 120/80  12/01/21 124/82    Wt Readings from Last 3 Encounters:  03/23/22 237 lb 8 oz (107.7 kg)  03/02/22 237 lb (107.5 kg)  01/21/22 238 lb (108 kg)    General appearance: alert, cooperative and appears stated age Ears: normal TM's and external ear canals both ears Throat: lips, mucosa, and tongue normal; teeth and gums normal Neck: no adenopathy, no carotid bruit, supple, symmetrical, trachea midline and thyroid not enlarged, symmetric, no tenderness/mass/nodules Back: symmetric, no curvature. ROM normal. No CVA tenderness. Lungs: clear to auscultation bilaterally Heart: regular rate and rhythm, S1, S2 normal, no murmur, click, rub or gallop Abdomen: soft, non-tender; bowel sounds normal; no masses,  no organomegaly Pulses: 2+ and symmetric Skin: Skin color, texture, turgor normal. No rashes or lesions Lymph nodes: Cervical, supraclavicular, and axillary nodes normal.  Lab Results  Component Value Date   HGBA1C 6.0 02/25/2022   HGBA1C 6.0 05/18/2021   HGBA1C 5.9 12/10/2020    Lab Results  Component Value Date   CREATININE 1.15 02/25/2022   CREATININE 0.97 09/28/2021   CREATININE 0.97 06/17/2021    Lab Results  Component Value Date   WBC 8.4 02/25/2022   HGB 12.5 02/25/2022   HCT 38.2 02/25/2022   PLT 462.0 (H) 02/25/2022   GLUCOSE 98 02/25/2022   CHOL 161 02/25/2022   TRIG 127.0 02/25/2022   HDL 58.90 02/25/2022   LDLDIRECT 172.8 09/17/2013   LDLCALC 76 02/25/2022   ALT 9 02/25/2022   AST 16 02/25/2022   NA 142 02/25/2022   K 4.4 02/25/2022   CL 107 02/25/2022   CREATININE 1.15 02/25/2022   BUN 14 02/25/2022   CO2 27  02/25/2022   TSH 2.56 02/25/2022   INR 1.0 06/11/2019   HGBA1C 6.0 02/25/2022   MICROALBUR 1.1 04/28/2015    NM Bone Scan 3 Phase  Result Date: 01/12/2022 CLINICAL DATA:  Right knee arthroplasty 2014, left knee arthroplasty 2009, trauma March 2023 2 right knee, pain and swelling after fall EXAM: NUCLEAR MEDICINE 3-PHASE BONE SCAN TECHNIQUE: Radionuclide angiographic images, immediate static blood pool images, and 3-hour delayed static images were obtained of the bilateral knees after intravenous injection of radiopharmaceutical. RADIOPHARMACEUTICALS:  20.05 mCi Tc-4mMDP IV COMPARISON:  None Available. FINDINGS: Vascular phase: Normal symmetrical distribution of radiotracer on vascular phase imaging. Blood pool phase:  Photopenia related to bilateral knee arthroplasties. No abnormal radiotracer accumulation. Delayed phase: Photopenia related to bilateral knee arthroplasties. Symmetrical radiotracer uptake surrounding the bilateral knee arthroplasties consistent with postsurgical change. No abnormal activity to suggest an acute or destructive process. IMPRESSION: 1. Normal postoperative appearance of bilateral knee arthroplasties. No evidence of loosening or infection. Electronically Signed   By: Randa Ngo M.D.   On: 01/12/2022 15:45    Assessment & Plan:   Problem List Items Addressed This Visit     Pharyngitis    Accompanied by bilaeral cervical LAD.  Throat is clear but patient requesting strep test, which was negative.  Given her recent history  of candida esophagitis and continued use of flonase , Will treat for same and refer to ENT for evaluation       Relevant Orders   POCT rapid strep A (Completed)   Other Visit Diagnoses     Odynophagia    -  Primary   Relevant Orders   Ambulatory referral to ENT       I spent a total of  30 minutes with this patient in a face to face visit on the date of this encounter reviewing the last office visit with Dr. Nicki Reaper,  her EGD om Sept  2022 and biopsy report ,  most recent imaging study ,   and post visit ordering of testing and therapeutics.    Follow-up: No follow-ups on file.   Crecencio Mc, MD

## 2022-03-23 NOTE — Patient Instructions (Signed)
Increase your dose of zyrtec to twice daily and stop using flonase.  I will send in Atrovent nasal spray to dry up the drip.  (It does NOT contain steroid)  Fluconazole prescription just like Dr Drinda Butts prescription has been sent  Nicole Sims FEELS BETTER

## 2022-03-25 NOTE — Telephone Encounter (Signed)
Pt was able to be seen by Dr. Derrel Nip 03/23/22.

## 2022-04-01 ENCOUNTER — Ambulatory Visit (INDEPENDENT_AMBULATORY_CARE_PROVIDER_SITE_OTHER): Payer: Medicare Other | Admitting: Podiatry

## 2022-04-01 DIAGNOSIS — M9262 Juvenile osteochondrosis of tarsus, left ankle: Secondary | ICD-10-CM

## 2022-04-01 DIAGNOSIS — M21862 Other specified acquired deformities of left lower leg: Secondary | ICD-10-CM

## 2022-04-01 DIAGNOSIS — Z9889 Other specified postprocedural states: Secondary | ICD-10-CM

## 2022-04-01 DIAGNOSIS — M7662 Achilles tendinitis, left leg: Secondary | ICD-10-CM

## 2022-04-01 NOTE — Progress Notes (Signed)
Subjective:  Patient ID: Nicole Sims, female    DOB: 06-Dec-1950,  MRN: [id number]  Chief Complaint  Patient presents with   Follow-up    Follow-up left foot.    DOS: 02/01/2022 Procedure: Left Achilles tendon repair with resection of Haglund's deformity gastrocnemius recession  71 y.o. female returns for post-op check.  Patient states she is doing well no acute complaints.  No other acute issues.  Review of Systems: Negative except as noted in the HPI. Denies N/V/F/Ch.  Past Medical History:  Diagnosis Date   Anemia    Anginal pain (HCC)    Anxiety    Asthma    Cataract cortical, senile    CHF (congestive heart failure) (HCC)    Chronic headaches    Diastolic heart failure (HCC)    Diverticulosis    Environmental allergies    GERD (gastroesophageal reflux disease)    Heart murmur    Hyperglycemia    Hyperlipidemia    Hypertension    Leukocytosis    Obesity    Osteoarthritis    Palpitations    Restless leg syndrome    Sleep apnea    Thrombocytosis    Urinary incontinence    mixed   Venous insufficiency    Vitamin D deficiency    Wears dentures    partial upper    Current Outpatient Medications:    acetaminophen (TYLENOL) 500 MG tablet, Take 500 mg by mouth every 6 (six) hours as needed., Disp: , Rfl:    AIMOVIG 140 MG/ML SOAJ, 140 Milligram(s) SUB-Q Once a Month, Disp: , Rfl:    albuterol (VENTOLIN HFA) 108 (90 Base) MCG/ACT inhaler, Inhale 2 puffs into the lungs every 4 (four) hours as needed for wheezing or shortness of breath., Disp: 1 each, Rfl: 0   aspirin 325 MG tablet, Take 325 mg by mouth daily., Disp: , Rfl:    budesonide-formoterol (SYMBICORT) 80-4.5 MCG/ACT inhaler, Inhale 2 puffs into the lungs 2 (two) times daily., Disp: 1 each, Rfl: 0   fluconazole (DIFLUCAN) 150 MG tablet, 2 tablets on Day 1,  then one tablet daily for 14 days, Disp: 15 tablet, Rfl: 0   fluticasone (FLONASE) 50 MCG/ACT nasal spray, USE 2 SPRAYS IN BOTH  NOSTRILS DAILY AS  DIRECTED, Disp: 64 g, Rfl: 3   furosemide (LASIX) 20 MG tablet, Take 1 tablet (20 mg) by mouth once daily as needed for weight gain of 3 lbs or more overnight, Disp: , Rfl:    ipratropium (ATROVENT) 0.06 % nasal spray, Place 2 sprays into both nostrils 4 (four) times daily., Disp: 15 mL, Rfl: 12   linaclotide (LINZESS) 290 MCG CAPS capsule, Take 290 mcg by mouth daily before breakfast., Disp: , Rfl:    metoprolol succinate (TOPROL-XL) 100 MG 24 hr tablet, TAKE 1 TABLET BY MOUTH DAILY  WITH OR IMMEDIATLEY FOLLOWING A  MEAL, Disp: 90 tablet, Rfl: 0   Omega-3 Fatty Acids (FISH OIL) 1000 MG CAPS, Take 1,000 mg by mouth., Disp: , Rfl:    ondansetron (ZOFRAN) 4 MG tablet, Take 1 tablet (4 mg total) by mouth 2 (two) times daily as needed for nausea or vomiting., Disp: 15 tablet, Rfl: 0   potassium chloride (KLOR-CON) 10 MEQ tablet, Take 1 tablet (10 meq) by mouth once daily as needed only when taking lasix (furosemide), Disp: , Rfl:    pregabalin (LYRICA) 100 MG capsule, Take 100 mg by mouth at bedtime., Disp: , Rfl:    pregabalin (LYRICA) 50 MG capsule, Take by  mouth. Take 1 capsule by mouth in the morning and 1 capsules at afternoon, Disp: , Rfl:    RABEprazole (ACIPHEX) 20 MG tablet, Take 20 mg by mouth 2 (two) times daily., Disp: , Rfl:    REPATHA 140 MG/ML SOSY, INJECT 140 MG UNDER THE SKIN EVERY 14 DAYS, Disp: 2 mL, Rfl: 11   sucralfate (CARAFATE) 1 g tablet, Take 1 g by mouth daily as needed (GI sympotms). , Disp: , Rfl:    venlafaxine XR (EFFEXOR-XR) 150 MG 24 hr capsule, TAKE 1 CAPSULE BY MOUTH  DAILY WITH BREAKFAST, Disp: 90 capsule, Rfl: 3  Social History   Tobacco Use  Smoking Status Former   Packs/day: 1.00   Years: 15.00   Total pack years: 15.00   Types: Cigarettes   Quit date: 08/17/1983   Years since quitting: 38.6  Smokeless Tobacco Never    Allergies  Allergen Reactions   Levaquin [Levofloxacin]    Augmentin [Amoxicillin-Pot Clavulanate] Other (See Comments)    Questionable  itching   Bactrim [Sulfamethoxazole-Trimethoprim] Rash   Doxycycline Itching   Hydrocodone-Acetaminophen Other (See Comments)    GI distress   Pseudoephedrine Rash    Itching of the scalp   Shellfish Allergy Nausea And Vomiting    Nausea and vomiting    Objective:  There were no vitals filed for this visit. There is no height or weight on file to calculate BMI. Constitutional Well developed. Well nourished.  Vascular Foot warm and well perfused. Capillary refill normal to all digits.   Neurologic Normal speech. Oriented to person, place, and time. Epicritic sensation to light touch grossly present bilaterally.  Dermatologic Skin completely reepithelialized.  Good range of motion noted at the ankle joint 10 degrees past neutral dorsiflexion.  No pain on palpation to the posterior heel  Orthopedic: No further tenderness to palpation noted about the surgical site.   Radiographs: None Assessment:   1. Achilles tendinitis, left leg   2. Haglund's deformity, left   3. Gastrocnemius equinus, left   4. Status post left foot surgery      Plan:  Patient was evaluated and treated and all questions answered.  S/p foot surgery left -Clinically healed the skin has completely reepithelialized.  At this time patient is officially discharged from my care she has returned to regular shoes without any restrictions.  No pain at the posterior Achilles area.  No follow-ups on file.

## 2022-04-15 DIAGNOSIS — R682 Dry mouth, unspecified: Secondary | ICD-10-CM | POA: Diagnosis not present

## 2022-04-15 DIAGNOSIS — G4733 Obstructive sleep apnea (adult) (pediatric): Secondary | ICD-10-CM | POA: Diagnosis not present

## 2022-04-15 DIAGNOSIS — R1314 Dysphagia, pharyngoesophageal phase: Secondary | ICD-10-CM | POA: Diagnosis not present

## 2022-04-26 ENCOUNTER — Other Ambulatory Visit: Payer: Self-pay | Admitting: Cardiovascular Disease

## 2022-04-26 ENCOUNTER — Telehealth: Payer: Self-pay

## 2022-04-26 NOTE — Patient Outreach (Signed)
  Care Coordination   04/26/2022 Name: Nicole Sims MRN: [id number] DOB: 06/02/1951   Care Coordination Outreach Attempts:  An unsuccessful telephone outreach was attempted today to offer the patient information about available care coordination services as a benefit of their health plan.   Follow Up Plan:  Additional outreach attempts will be made to offer the patient care coordination information and services.   Encounter Outcome:  No Answer  Care Coordination Interventions Activated:  No   Care Coordination Interventions:  No, not indicated       Enzo Montgomery, RN,BSN,CCM [facility] Telephonic Care Management Coordinator Direct Phone: [phone] Toll Free: [phone] Fax: [phone]

## 2022-04-29 ENCOUNTER — Telehealth: Payer: Self-pay

## 2022-04-29 NOTE — Telephone Encounter (Signed)
Prior Authorization for Repatha 140 mg/mL initiated by [email or web address] KEY: [id number] Response:  Wait for Determination Please wait for [employer] 2017 NCPDP to return a determination.

## 2022-04-29 NOTE — Telephone Encounter (Signed)
Request Reference Number: [id number]. REPATHA INJ '140MG'$ /ML is approved through 08/15/2022. Your patient may now fill this prescription and it will be covered.

## 2022-05-03 ENCOUNTER — Telehealth: Payer: Self-pay

## 2022-05-03 NOTE — Patient Outreach (Signed)
  Care Coordination   05/03/2022 Name: Nicole Sims MRN: [id number] DOB: Nov 08, 1950   Care Coordination Outreach Attempts:  A second unsuccessful outreach was attempted today to offer the patient with information about available care coordination services as a benefit of their health plan.     Follow Up Plan:  Additional outreach attempts will be made to offer the patient care coordination information and services.   Encounter Outcome:  No Answer  Care Coordination Interventions Activated:  No   Care Coordination Interventions:  No, not indicated    Enzo Montgomery, RN,BSN,CCM [facility] Telephonic Care Management Coordinator Direct Phone: [phone] Toll Free: [phone] Fax: [phone]

## 2022-05-05 ENCOUNTER — Telehealth: Payer: Self-pay

## 2022-05-05 NOTE — Patient Outreach (Signed)
  Care Coordination   05/05/2022 Name: Nicole Sims MRN: [id number] DOB: 22-Apr-1951   Care Coordination Outreach Attempts:  A third unsuccessful outreach was attempted today to offer the patient with information about available care coordination services as a benefit of their health plan.   Follow Up Plan:  No further outreach attempts will be made at this time. We have been unable to contact the patient to offer or enroll patient in care coordination services  Encounter Outcome:  No Answer  Care Coordination Interventions Activated:  No   Care Coordination Interventions:  No, not indicated      Enzo Montgomery, RN,BSN,CCM [facility] Telephonic Care Management Coordinator Direct Phone: [phone] Toll Free: [phone] Fax: [phone]

## 2022-05-10 ENCOUNTER — Encounter: Payer: Self-pay | Admitting: Internal Medicine

## 2022-05-10 DIAGNOSIS — Z03818 Encounter for observation for suspected exposure to other biological agents ruled out: Secondary | ICD-10-CM | POA: Diagnosis not present

## 2022-05-10 DIAGNOSIS — U071 COVID-19: Secondary | ICD-10-CM | POA: Diagnosis not present

## 2022-05-10 NOTE — Telephone Encounter (Signed)
I called and spoke with patient to try to get her scheduled with someone to treat or get patient started on antiviral. She was actually willing to go to UC today & plans to go to [facility].

## 2022-05-11 ENCOUNTER — Telehealth: Payer: Medicare Other | Admitting: Internal Medicine

## 2022-05-19 DIAGNOSIS — R202 Paresthesia of skin: Secondary | ICD-10-CM | POA: Diagnosis not present

## 2022-05-20 ENCOUNTER — Other Ambulatory Visit: Payer: Self-pay | Admitting: Internal Medicine

## 2022-05-20 DIAGNOSIS — Z1231 Encounter for screening mammogram for malignant neoplasm of breast: Secondary | ICD-10-CM

## 2022-05-31 ENCOUNTER — Other Ambulatory Visit (INDEPENDENT_AMBULATORY_CARE_PROVIDER_SITE_OTHER): Payer: Medicare Other

## 2022-05-31 DIAGNOSIS — D473 Essential (hemorrhagic) thrombocythemia: Secondary | ICD-10-CM | POA: Diagnosis not present

## 2022-05-31 DIAGNOSIS — R300 Dysuria: Secondary | ICD-10-CM | POA: Diagnosis not present

## 2022-05-31 DIAGNOSIS — E7800 Pure hypercholesterolemia, unspecified: Secondary | ICD-10-CM | POA: Diagnosis not present

## 2022-05-31 DIAGNOSIS — I10 Essential (primary) hypertension: Secondary | ICD-10-CM

## 2022-05-31 DIAGNOSIS — R739 Hyperglycemia, unspecified: Secondary | ICD-10-CM

## 2022-05-31 LAB — CBC WITH DIFFERENTIAL/PLATELET
Basophils Absolute: 0.1 10*3/uL (ref 0.0–0.1)
Basophils Relative: 0.9 % (ref 0.0–3.0)
Eosinophils Absolute: 0.5 10*3/uL (ref 0.0–0.7)
Eosinophils Relative: 6.2 % — ABNORMAL HIGH (ref 0.0–5.0)
HCT: 38.5 % (ref 36.0–46.0)
Hemoglobin: 12.4 g/dL (ref 12.0–15.0)
Lymphocytes Relative: 44.5 % (ref 12.0–46.0)
Lymphs Abs: 3.3 10*3/uL (ref 0.7–4.0)
MCHC: 32.2 g/dL (ref 30.0–36.0)
MCV: 86.8 fl (ref 78.0–100.0)
Monocytes Absolute: 0.6 10*3/uL (ref 0.1–1.0)
Monocytes Relative: 8.7 % (ref 3.0–12.0)
Neutro Abs: 2.9 10*3/uL (ref 1.4–7.7)
Neutrophils Relative %: 39.7 % — ABNORMAL LOW (ref 43.0–77.0)
Platelets: 426 10*3/uL — ABNORMAL HIGH (ref 150.0–400.0)
RBC: 4.43 Mil/uL (ref 3.87–5.11)
RDW: 14.4 % (ref 11.5–15.5)
WBC: 7.4 10*3/uL (ref 4.0–10.5)

## 2022-05-31 LAB — HEPATIC FUNCTION PANEL
ALT: 11 U/L (ref 0–35)
AST: 17 U/L (ref 0–37)
Albumin: 4 g/dL (ref 3.5–5.2)
Alkaline Phosphatase: 91 U/L (ref 39–117)
Bilirubin, Direct: 0.1 mg/dL (ref 0.0–0.3)
Total Bilirubin: 0.5 mg/dL (ref 0.2–1.2)
Total Protein: 7.2 g/dL (ref 6.0–8.3)

## 2022-05-31 LAB — BASIC METABOLIC PANEL
BUN: 13 mg/dL (ref 6–23)
CO2: 26 mEq/L (ref 19–32)
Calcium: 9.3 mg/dL (ref 8.4–10.5)
Chloride: 106 mEq/L (ref 96–112)
Creatinine, Ser: 1.08 mg/dL (ref 0.40–1.20)
GFR: 51.46 mL/min — ABNORMAL LOW (ref >60.00)
Glucose, Bld: 92 mg/dL (ref 70–99)
Potassium: 4.6 mEq/L (ref 3.5–5.1)
Sodium: 143 mEq/L (ref 135–145)

## 2022-05-31 LAB — URINALYSIS, MICROSCOPIC ONLY

## 2022-05-31 LAB — LIPID PANEL
Cholesterol: 170 mg/dL (ref 0–200)
HDL: 53.3 mg/dL (ref >39.00)
LDL Cholesterol: 85 mg/dL (ref 0–99)
NonHDL: 116.58
Total CHOL/HDL Ratio: 3
Triglycerides: 159 mg/dL — ABNORMAL HIGH (ref 0.0–149.0)
VLDL: 31.8 mg/dL (ref 0.0–40.0)

## 2022-05-31 LAB — HEMOGLOBIN A1C: Hgb A1c MFr Bld: 6.1 % (ref 4.6–6.5)

## 2022-06-02 LAB — URINE CULTURE
MICRO NUMBER:: 14055501
SPECIMEN QUALITY:: ADEQUATE

## 2022-06-03 ENCOUNTER — Encounter: Payer: Self-pay | Admitting: Internal Medicine

## 2022-06-03 ENCOUNTER — Ambulatory Visit (INDEPENDENT_AMBULATORY_CARE_PROVIDER_SITE_OTHER): Payer: Medicare Other | Admitting: Internal Medicine

## 2022-06-03 ENCOUNTER — Other Ambulatory Visit: Payer: Self-pay

## 2022-06-03 VITALS — BP 120/80 | HR 65 | Temp 98.7°F | Ht 65.0 in | Wt 236.8 lb

## 2022-06-03 DIAGNOSIS — R739 Hyperglycemia, unspecified: Secondary | ICD-10-CM

## 2022-06-03 DIAGNOSIS — Z23 Encounter for immunization: Secondary | ICD-10-CM | POA: Diagnosis not present

## 2022-06-03 DIAGNOSIS — I10 Essential (primary) hypertension: Secondary | ICD-10-CM

## 2022-06-03 DIAGNOSIS — I471 Supraventricular tachycardia, unspecified: Secondary | ICD-10-CM

## 2022-06-03 DIAGNOSIS — N644 Mastodynia: Secondary | ICD-10-CM

## 2022-06-03 DIAGNOSIS — Z0000 Encounter for general adult medical examination without abnormal findings: Secondary | ICD-10-CM | POA: Diagnosis not present

## 2022-06-03 DIAGNOSIS — K219 Gastro-esophageal reflux disease without esophagitis: Secondary | ICD-10-CM

## 2022-06-03 DIAGNOSIS — E7800 Pure hypercholesterolemia, unspecified: Secondary | ICD-10-CM | POA: Diagnosis not present

## 2022-06-03 DIAGNOSIS — I6509 Occlusion and stenosis of unspecified vertebral artery: Secondary | ICD-10-CM

## 2022-06-03 DIAGNOSIS — D473 Essential (hemorrhagic) thrombocythemia: Secondary | ICD-10-CM

## 2022-06-03 DIAGNOSIS — I5030 Unspecified diastolic (congestive) heart failure: Secondary | ICD-10-CM

## 2022-06-03 DIAGNOSIS — I700 Atherosclerosis of aorta: Secondary | ICD-10-CM | POA: Diagnosis not present

## 2022-06-03 MED ORDER — NITROFURANTOIN MONOHYD MACRO 100 MG PO CAPS: 100.0000 mg | ORAL_CAPSULE | Freq: Two times a day (BID) | ORAL | 0 refills | Status: DC

## 2022-06-03 NOTE — Progress Notes (Addendum)
Patient ID: Nicole Sims, female   DOB: 04/05/1951, 71 y.o.   MRN: [id number]   Subjective:    Patient ID: Nicole Sims, female    DOB: 10/16/1950, 71 y.o.   MRN: [id number]   Patient here for  Chief Complaint  Patient presents with   Annual Exam   .   HPI Recent covid infection.  Reports symptoms have resolved.  No increased cough or congestion.  Breathing stable.  F/u with podiatry 04/01/22 - s/p left achilles tendon repair.  Discharged.  No chest pain reported.  Breathing stable.  Some nausea.  No abdominal pain reported.  Right breast - tender.     Past Medical History:  Diagnosis Date   Anemia    Anginal pain (HCC)    Anxiety    Asthma    Cataract cortical, senile    CHF (congestive heart failure) (HCC)    Chronic headaches    Diastolic heart failure (HCC)    Diverticulosis    Environmental allergies    GERD (gastroesophageal reflux disease)    Heart murmur    Hyperglycemia    Hyperlipidemia    Hypertension    Leukocytosis    Obesity    Osteoarthritis    Palpitations    Restless leg syndrome    Sleep apnea    Thrombocytosis    Urinary incontinence    mixed   Venous insufficiency    Vitamin D deficiency    Wears dentures    partial upper   Past Surgical History:  Procedure Laterality Date   BLADDER SURGERY     x2   [facility]   BREAST CYST ASPIRATION Bilateral 2005   approximate year   CARDIAC CATHETERIZATION     [place]   CATARACT EXTRACTION W/PHACO Right 04/24/2020   Procedure: CATARACT EXTRACTION PHACO AND INTRAOCULAR LENS PLACEMENT (IOC) RIGHT;  Surgeon: Porfilio, William, MD;  Location: [facility];  Service: Ophthalmology;  Laterality: Right;  6.75 0:37.3   CATARACT EXTRACTION W/PHACO Left 01/27/2021   Procedure: CATARACT EXTRACTION PHACO AND INTRAOCULAR LENS PLACEMENT (IOC) LEFT;  Surgeon: Porfilio, William, MD;  Location: [facility];  Service: Ophthalmology;  Laterality: Left;  6.75 00:43.9   CERVICAL CONE BIOPSY      CIS   CHOLECYSTECTOMY     COLONOSCOPY WITH PROPOFOL N/A 07/19/2016   Procedure: COLONOSCOPY WITH PROPOFOL;  Surgeon: Robert T Elliott, MD;  Location: [facility];  Service: Endoscopy;  Laterality: N/A;   COLONOSCOPY WITH PROPOFOL N/A 10/20/2018   Procedure: COLONOSCOPY WITH PROPOFOL;  Surgeon: Skulskie, Martin U, MD;  Location: [facility];  Service: Endoscopy;  Laterality: N/A;   ESOPHAGOGASTRODUODENOSCOPY N/A 05/12/2021   Procedure: ESOPHAGOGASTRODUODENOSCOPY (EGD);  Surgeon: Locklear, Cameron T, MD;  Location: [facility];  Service: Endoscopy;  Laterality: N/A;   ESOPHAGOGASTRODUODENOSCOPY (EGD) WITH PROPOFOL N/A 07/19/2016   Procedure: ESOPHAGOGASTRODUODENOSCOPY (EGD) WITH PROPOFOL;  Surgeon: Robert T Elliott, MD;  Location: [facility];  Service: Endoscopy;  Laterality: N/A;   FLEXIBLE SIGMOIDOSCOPY     KNEE ARTHROSCOPY  08/13/08   knee replacement and revision     left   RECTOCELE REPAIR     RIGHT/LEFT HEART CATH AND CORONARY ANGIOGRAPHY Bilateral 04/10/2018   Procedure: RIGHT/LEFT HEART CATH AND CORONARY ANGIOGRAPHY;  Surgeon: Arida, Muhammad A, MD;  Location: [facility];  Service: Cardiovascular;  Laterality: Bilateral;   ROTATOR CUFF REPAIR     bilateral   SHOULDER SURGERY  11/17/05   TONSILLECTOMY  1962   VAGINAL HYSTERECTOMY    1974   abnormal pap and carcinoma in situ   Family History  Problem Relation Age of Onset   Diabetes Mellitus II Father    Thyroid disease Father    Alzheimer's disease Father    Breast cancer Maternal Aunt    Hypertension Mother    Heart Problems Brother    Leukemia Maternal Aunt    Alzheimer's disease Paternal Aunt    Alzheimer's disease Paternal Uncle    Alzheimer's disease Paternal Uncle    Alzheimer's disease Paternal Uncle    Alzheimer's disease Paternal Aunt    Colon cancer Neg Hx    Kidney cancer Neg Hx    Bladder Cancer Neg Hx    Social History   Socioeconomic History   Marital status: Married    Spouse name: Not on  file   Number of children: Not on file   Years of education: Not on file   Highest education level: Not on file  Occupational History   Occupation: retired  Tobacco Use   Smoking status: Former    Packs/day: 1.00    Years: 15.00    Total pack years: 15.00    Types: Cigarettes    Quit date: 08/17/1983    Years since quitting: 38.8   Smokeless tobacco: Never  Vaping Use   Vaping Use: Never used  Substance and Sexual Activity   Alcohol use: Yes    Alcohol/week: 0.0 standard drinks of alcohol    Comment: rarely   Drug use: No   Sexual activity: Not on file  Other Topics Concern   Not on file  Social History Narrative   Lives at home with husband   Social Determinants of Health   Financial Resource Strain: Low Risk  (01/21/2022)   Overall Financial Resource Strain (CARDIA)    Difficulty of Paying Living Expenses: Not hard at all  Food Insecurity: No Food Insecurity (01/21/2022)   Hunger Vital Sign    Worried About Running Out of Food in the Last Year: Never true    Ran Out of Food in the Last Year: Never true  Transportation Needs: No Transportation Needs (01/21/2022)   PRAPARE - Hydrologist (Medical): No    Lack of Transportation (Non-Medical): No  Physical Activity: Inactive (01/21/2022)   Exercise Vital Sign    Days of Exercise per Week: 0 days    Minutes of Exercise per Session: 0 min  Stress: No Stress Concern Present (01/21/2022)   [place]    Feeling of Stress : Only a little  Social Connections: Unknown (01/21/2022)   Social Connection and Isolation Panel [NHANES]    Frequency of Communication with Friends and Family: Twice a week    Frequency of Social Gatherings with Friends and Family: Twice a week    Attends Religious Services: Not on Advertising copywriter or Organizations: Not on file    Attends Archivist Meetings: Not on file    Marital Status: Married      Review of Systems  Constitutional:  Negative for appetite change and unexpected weight change.  HENT:  Negative for congestion, sinus pressure and sore throat.   Eyes:  Negative for pain and visual disturbance.  Respiratory:  Negative for cough, chest tightness and shortness of breath.   Cardiovascular:  Negative for chest pain, palpitations and leg swelling.  Gastrointestinal:  Negative for abdominal pain, constipation and vomiting.  Genitourinary:  Negative  for difficulty urinating and dysuria.  Musculoskeletal:  Negative for joint swelling and myalgias.  Skin:  Negative for color change and rash.  Neurological:  Negative for dizziness and headaches.  Hematological:  Negative for adenopathy. Does not bruise/bleed easily.  Psychiatric/Behavioral:  Negative for agitation and dysphoric mood.        Objective:     BP 120/80 (BP Location: Left Arm, Patient Position: Sitting, Cuff Size: Large)   Pulse 65   Temp 98.7 F (37.1 C) (Oral)   Ht 5' 5" (1.651 m)   Wt 236 lb 12.8 oz (107.4 kg)   SpO2 98%   BMI 39.41 kg/m  Wt Readings from Last 3 Encounters:  06/03/22 236 lb 12.8 oz (107.4 kg)  03/23/22 237 lb 8 oz (107.7 kg)  03/02/22 237 lb (107.5 kg)    Physical Exam Vitals reviewed.  Constitutional:      General: She is not in acute distress.    Appearance: Normal appearance.  HENT:     Head: Normocephalic and atraumatic.     Right Ear: External ear normal.     Left Ear: External ear normal.  Eyes:     General: No scleral icterus.       Right eye: No discharge.        Left eye: No discharge.     Conjunctiva/sclera: Conjunctivae normal.  Neck:     Thyroid: No thyromegaly.  Cardiovascular:     Rate and Rhythm: Normal rate and regular rhythm.  Pulmonary:     Effort: No respiratory distress.     Breath sounds: Normal breath sounds. No wheezing.     Comments: Breasts:  no nipple discharge or nipple retraction present.  Increased pain and fullness - 9:00 left breast.   Could not appreciate other nodules.  No axillary adenopathy.  Abdominal:     General: Bowel sounds are normal.     Palpations: Abdomen is soft.     Tenderness: There is no abdominal tenderness.  Musculoskeletal:        General: No swelling or tenderness.     Cervical back: Neck supple. No tenderness.  Lymphadenopathy:     Cervical: No cervical adenopathy.  Skin:    Findings: No erythema or rash.  Neurological:     Mental Status: She is alert.  Psychiatric:        Mood and Affect: Mood normal.        Behavior: Behavior normal.      Outpatient Encounter Medications as of 06/03/2022  Medication Sig   acetaminophen (TYLENOL) 500 MG tablet Take 500 mg by mouth every 6 (six) hours as needed.   albuterol (VENTOLIN HFA) 108 (90 Base) MCG/ACT inhaler Inhale 2 puffs into the lungs every 4 (four) hours as needed for wheezing or shortness of breath.   aspirin 325 MG tablet Take 325 mg by mouth daily.   budesonide-formoterol (SYMBICORT) 80-4.5 MCG/ACT inhaler Inhale 2 puffs into the lungs 2 (two) times daily.   furosemide (LASIX) 20 MG tablet Take 1 tablet (20 mg) by mouth once daily as needed for weight gain of 3 lbs or more overnight   linaclotide (LINZESS) 290 MCG CAPS capsule Take 290 mcg by mouth daily before breakfast.   metoprolol succinate (TOPROL-XL) 100 MG 24 hr tablet TAKE 1 TABLET BY MOUTH DAILY  WITH OR IMMEDIATLEY FOLLOWING A  MEAL   nitrofurantoin, macrocrystal-monohydrate, (MACROBID) 100 MG capsule Take 1 capsule (100 mg total) by mouth 2 (two) times daily.   Omega-3 Fatty Acids (  FISH OIL) 1000 MG CAPS Take 1,000 mg by mouth.   ondansetron (ZOFRAN) 4 MG tablet Take 1 tablet (4 mg total) by mouth 2 (two) times daily as needed for nausea or vomiting.   potassium chloride (KLOR-CON) 10 MEQ tablet Take 1 tablet (10 meq) by mouth once daily as needed only when taking lasix (furosemide)   pregabalin (LYRICA) 100 MG capsule Take 100 mg by mouth at bedtime.   pregabalin (LYRICA) 50 MG  capsule Take by mouth. Take 1 capsule by mouth in the morning and 1 capsules at afternoon   RABEprazole (ACIPHEX) 20 MG tablet Take 20 mg by mouth 2 (two) times daily.   REPATHA 140 MG/ML SOSY INJECT 140 MG UNDER THE SKIN EVERY 14 DAYS   sucralfate (CARAFATE) 1 g tablet Take 1 g by mouth daily as needed (GI sympotms).    venlafaxine XR (EFFEXOR-XR) 150 MG 24 hr capsule TAKE 1 CAPSULE BY MOUTH  DAILY WITH BREAKFAST   [DISCONTINUED] AIMOVIG 140 MG/ML SOAJ 140 Milligram(s) SUB-Q Once a Month (Patient not taking: Reported on 06/03/2022)   [DISCONTINUED] fluconazole (DIFLUCAN) 150 MG tablet 2 tablets on Day 1,  then one tablet daily for 14 days   [DISCONTINUED] fluticasone (FLONASE) 50 MCG/ACT nasal spray USE 2 SPRAYS IN BOTH  NOSTRILS DAILY AS DIRECTED   [DISCONTINUED] ipratropium (ATROVENT) 0.06 % nasal spray Place 2 sprays into both nostrils 4 (four) times daily.   No facility-administered encounter medications on file as of 06/03/2022.     Lab Results  Component Value Date   WBC 7.4 05/31/2022   HGB 12.4 05/31/2022   HCT 38.5 05/31/2022   PLT 426.0 (H) 05/31/2022   GLUCOSE 92 05/31/2022   CHOL 170 05/31/2022   TRIG 159.0 (H) 05/31/2022   HDL 53.30 05/31/2022   LDLDIRECT 172.8 09/17/2013   LDLCALC 85 05/31/2022   ALT 11 05/31/2022   AST 17 05/31/2022   NA 143 05/31/2022   K 4.6 05/31/2022   CL 106 05/31/2022   CREATININE 1.08 05/31/2022   BUN 13 05/31/2022   CO2 26 05/31/2022   TSH 2.56 02/25/2022   INR 1.0 06/11/2019   HGBA1C 6.1 05/31/2022   MICROALBUR 1.1 04/28/2015    NM Bone Scan 3 Phase  Result Date: 01/12/2022 CLINICAL DATA:  Right knee arthroplasty 2014, left knee arthroplasty 2009, trauma March 2023 2 right knee, pain and swelling after fall EXAM: NUCLEAR MEDICINE 3-PHASE BONE SCAN TECHNIQUE: Radionuclide angiographic images, immediate static blood pool images, and 3-hour delayed static images were obtained of the bilateral knees after intravenous injection of  radiopharmaceutical. RADIOPHARMACEUTICALS:  20.05 mCi Tc-99m MDP IV COMPARISON:  None Available. FINDINGS: Vascular phase: Normal symmetrical distribution of radiotracer on vascular phase imaging. Blood pool phase: Photopenia related to bilateral knee arthroplasties. No abnormal radiotracer accumulation. Delayed phase: Photopenia related to bilateral knee arthroplasties. Symmetrical radiotracer uptake surrounding the bilateral knee arthroplasties consistent with postsurgical change. No abnormal activity to suggest an acute or destructive process. IMPRESSION: 1. Normal postoperative appearance of bilateral knee arthroplasties. No evidence of loosening or infection. Electronically Signed   By: Michael  Brown M.D.   On: 01/12/2022 15:45       Assessment & Plan:   Problem List Items Addressed This Visit     Aortic atherosclerosis (HCC)    On repatha.  Continue.       Breast tenderness    Right breast tenderness and fullness - 9:00 left breast.  Due bilateral.  Schedule for diagnostic mammogram with possible ultrasound right   breast.  Already scheduled for screening mammogram.  Need to change to diagnostic.       Relevant Orders   MM DIAG BREAST TOMO BILATERAL   US BREAST LTD UNI RIGHT INC AXILLA   US BREAST LTD UNI LEFT INC AXILLA   Diastolic heart failure ([place])    History of diastolic dysfunction.  EF 60-65%.  No increased sob reported.  Follow. Continue metoprolol.       Essential (hemorrhagic) thrombocythemia ([place])    Worked up previously by hematology.  Follow cbc.       Relevant Orders   CBC with Differential/Platelet   Essential hypertension    Continue toprol.  Blood pressure doing well.  Follow blood pressure.  Follow metabolic panel.       Relevant Orders   Basic metabolic panel   Gastroesophageal reflux disease    Continue aciphex.        Health care maintenance    Physical today 06/03/22.  Mammogram 03/02/21 - Birads I.  Colonoscopy 10/2018.  Recommended f/u in 5 years.   Increased pain and fullness - right breast.  Due screening.  Change mammogram to diagnostic mammogram with possible ultrasound.       Hypercholesteremia    On repatha and doing well.  Follow.        Relevant Orders   Hepatic function panel   Lipid panel   Hyperglycemia    Low carb diet and exercise.  Follow met b and a1c.       Relevant Orders   Hemoglobin A1c   PSVT (paroxysmal supraventricular tachycardia)    Has a history of PSVT.  Has seen Dr Fletcher Anon.  On metoprolol.  No increased heart rate or palpitations reported.  Continue metoprolol.        Stenosis of vertebral artery without cerebral infarction    Continue aspirin and repatha.       Other Visit Diagnoses     Routine general medical examination at a health care facility    -  Primary   Need for immunization against influenza       Relevant Orders   Flu Vaccine QUAD High Dose(Fluad) (Completed)      Addendum:  pt called back and reported pain 4-5:00 right breast as well.  Already ordered diagnostic mammogram will order bilateral ultrasound in case ultrasound is needed.   Einar Pheasant, MD

## 2022-06-03 NOTE — Assessment & Plan Note (Addendum)
Physical today 06/03/22.  Mammogram 03/02/21 - Birads I.  Colonoscopy 10/2018.  Recommended f/u in 5 years.  Increased pain and fullness - right breast.  Due screening.  Change mammogram to diagnostic mammogram with possible ultrasound.

## 2022-06-07 ENCOUNTER — Encounter: Payer: Self-pay | Admitting: Internal Medicine

## 2022-06-07 ENCOUNTER — Telehealth: Payer: Self-pay | Admitting: Internal Medicine

## 2022-06-07 DIAGNOSIS — N644 Mastodynia: Secondary | ICD-10-CM | POA: Insufficient documentation

## 2022-06-07 NOTE — Assessment & Plan Note (Signed)
On repatha and doing well.  Follow.   

## 2022-06-07 NOTE — Assessment & Plan Note (Signed)
Continue aspirin and repatha.

## 2022-06-07 NOTE — Telephone Encounter (Signed)
Pt called in stating she had a physical with the provider last week and she stated the provider called in a mammogram order for one of her breast but it was the wrong breast. It was supposed to be the left breast and not the right one. Now pt want to see if the provider can call in an order in for her right breast also because it is tender and sore

## 2022-06-07 NOTE — Assessment & Plan Note (Signed)
Low carb diet and exercise.  Follow met b and a1c.  

## 2022-06-07 NOTE — Assessment & Plan Note (Signed)
History of diastolic dysfunction.  EF 60-65%.  No increased sob reported.  Follow. Continue metoprolol.  

## 2022-06-07 NOTE — Assessment & Plan Note (Signed)
Has a history of PSVT.  Has seen Dr Arida.  On metoprolol.  No increased heart rate or palpitations reported.  Continue metoprolol.   

## 2022-06-07 NOTE — Assessment & Plan Note (Signed)
Continue toprol.  Blood pressure doing well.  Follow blood pressure.  Follow metabolic panel.  

## 2022-06-07 NOTE — Assessment & Plan Note (Signed)
Continue aciphex.  

## 2022-06-07 NOTE — Assessment & Plan Note (Addendum)
Right breast tenderness and fullness - 9:00 left breast.  Due bilateral.  Schedule for diagnostic mammogram with possible ultrasound right breast.  Already scheduled for screening mammogram.  Need to change to diagnostic.

## 2022-06-07 NOTE — Assessment & Plan Note (Signed)
On repatha.  Continue.  

## 2022-06-07 NOTE — Assessment & Plan Note (Signed)
Worked up previously by hematology.  Follow cbc.  

## 2022-06-08 NOTE — Telephone Encounter (Signed)
Spoke to pt.  Explained bilateral diagnostic mammogram is ordered.  Did order breast ultrasound of right and left breast if needed.

## 2022-06-08 NOTE — Addendum Note (Signed)
Addended by: Alisa Graff on: 06/08/2022 06:51 PM   Modules accepted: Orders

## 2022-06-10 ENCOUNTER — Other Ambulatory Visit: Payer: Self-pay | Admitting: Cardiovascular Disease

## 2022-06-10 ENCOUNTER — Telehealth: Payer: Self-pay | Admitting: Cardiovascular Disease

## 2022-06-10 NOTE — Telephone Encounter (Signed)
Rx refill sent to pharmacy. 

## 2022-06-10 NOTE — Telephone Encounter (Signed)
Pt c/o of Chest Pain: STAT if CP now or developed within 24 hours  1. Are you having CP right now? somewhat   2. Are you experiencing any other symptoms (ex. SOB, nausea, vomiting, sweating)? Some SOB  3. How long have you been experiencing CP? Since Tuesday 10/24  4. Is your CP continuous or coming and going? Continuous   5. Have you taken Nitroglycerin? yes ?

## 2022-06-10 NOTE — Telephone Encounter (Signed)
Spoke with patient who reports chest pain that has been going on since 10/24. She feels tight in her chest. She reports pain over a wide area on the L side of chest. The pain radiates to neck, shoulder, left arm. She questions if she pulled anything. She is audibly SOB on the phone - this has been going on for a couple of days per report. When she lays on her back, her chest feels heavier. She took Tylenol last night and NTG. She also took NTG on Tuesday. NTG may have helped the pain a little bit.   Advised patient be evaluated at [facility]. She is hesitant of this. No DOD or APP visits at [facility] office. Advised will send a message to Dr. Fletcher Anon and Cadence PA who saw her last and she has a visit with her in a few weeks. She said she "is not sure what I am going to do".   Advised will follow up if advice received from MD/PA

## 2022-06-10 NOTE — Telephone Encounter (Signed)
Good Morning,  Could you please schedule this patient a 6 month follow up visit? She was last seen by Cadence 11-27-21. Thank you so much.

## 2022-06-10 NOTE — Telephone Encounter (Signed)
Scheduled 11/14

## 2022-06-15 ENCOUNTER — Ambulatory Visit
Admission: RE | Admit: 2022-06-15 | Discharge: 2022-06-15 | Disposition: A | Discharge status: Home / Self Care | Payer: Medicare Other | Source: Ambulatory Visit | Attending: Internal Medicine | Admitting: Internal Medicine

## 2022-06-15 ENCOUNTER — Other Ambulatory Visit: Payer: Self-pay | Admitting: Internal Medicine

## 2022-06-15 DIAGNOSIS — N644 Mastodynia: Secondary | ICD-10-CM | POA: Insufficient documentation

## 2022-06-15 DIAGNOSIS — R9230 Dense breasts, unspecified: Secondary | ICD-10-CM | POA: Diagnosis not present

## 2022-06-15 DIAGNOSIS — R928 Other abnormal and inconclusive findings on diagnostic imaging of breast: Secondary | ICD-10-CM

## 2022-06-15 DIAGNOSIS — R921 Mammographic calcification found on diagnostic imaging of breast: Secondary | ICD-10-CM

## 2022-06-15 DIAGNOSIS — N6002 Solitary cyst of left breast: Secondary | ICD-10-CM | POA: Diagnosis not present

## 2022-06-15 NOTE — Telephone Encounter (Signed)
Spoke with patient to follow up with her. She reports no further chest pain and that her shortness of breath has gotten better as well. Reviewed signs and symptoms with strict ED precautions with her. She verbalized understanding with no further questions at this time.

## 2022-06-15 NOTE — Telephone Encounter (Signed)
Furth, Cadence H, PA-C  Sheral Apley M, RN Caller: Unspecified (5 days ago,  2:06 PM) Patient may need to go to the ER if chest pain and SOB continues. If she does not want to go to the ER, we can try and fit her in the soonest available appointment.          Above message was routed only to this writer - not addressed d/t out of office since 10/26

## 2022-06-16 ENCOUNTER — Telehealth: Payer: Self-pay | Admitting: Internal Medicine

## 2022-06-16 NOTE — Telephone Encounter (Signed)
Pt called stating someone called her wanting to go over test results from [facility] center

## 2022-06-22 DIAGNOSIS — K219 Gastro-esophageal reflux disease without esophagitis: Secondary | ICD-10-CM | POA: Diagnosis not present

## 2022-06-22 DIAGNOSIS — R1314 Dysphagia, pharyngoesophageal phase: Secondary | ICD-10-CM | POA: Diagnosis not present

## 2022-06-22 DIAGNOSIS — K589 Irritable bowel syndrome without diarrhea: Secondary | ICD-10-CM | POA: Diagnosis not present

## 2022-06-24 ENCOUNTER — Ambulatory Visit
Admission: RE | Admit: 2022-06-24 | Discharge: 2022-06-24 | Disposition: A | Discharge status: Home / Self Care | Payer: Medicare Other | Source: Ambulatory Visit | Attending: Internal Medicine | Admitting: Internal Medicine

## 2022-06-24 DIAGNOSIS — R921 Mammographic calcification found on diagnostic imaging of breast: Secondary | ICD-10-CM

## 2022-06-24 DIAGNOSIS — R928 Other abnormal and inconclusive findings on diagnostic imaging of breast: Secondary | ICD-10-CM

## 2022-06-24 DIAGNOSIS — N6082 Other benign mammary dysplasias of left breast: Secondary | ICD-10-CM | POA: Diagnosis not present

## 2022-06-24 HISTORY — PX: BREAST BIOPSY: SHX20

## 2022-06-25 LAB — SURGICAL PATHOLOGY

## 2022-06-28 ENCOUNTER — Encounter: Payer: Self-pay | Admitting: *Deleted

## 2022-06-28 NOTE — Progress Notes (Signed)
Referral recieved from [facility] for benign breast mass.  Appointment scheduled for surgical consultation with Dr. Peyton Najjar on 11/30 at 9:15.  Appt details given to her.   No further needs at this time.

## 2022-06-29 ENCOUNTER — Encounter: Payer: Self-pay | Admitting: Medical

## 2022-06-29 ENCOUNTER — Ambulatory Visit: Payer: Medicare Other | Attending: Medical | Admitting: Medical

## 2022-06-29 VITALS — BP 130/78 | HR 63 | Ht 65.0 in | Wt 236.6 lb

## 2022-06-29 DIAGNOSIS — E782 Mixed hyperlipidemia: Secondary | ICD-10-CM | POA: Diagnosis not present

## 2022-06-29 DIAGNOSIS — I5032 Chronic diastolic (congestive) heart failure: Secondary | ICD-10-CM | POA: Diagnosis not present

## 2022-06-29 DIAGNOSIS — I471 Supraventricular tachycardia, unspecified: Secondary | ICD-10-CM | POA: Diagnosis not present

## 2022-06-29 DIAGNOSIS — I10 Essential (primary) hypertension: Secondary | ICD-10-CM

## 2022-06-29 DIAGNOSIS — R079 Chest pain, unspecified: Secondary | ICD-10-CM | POA: Diagnosis not present

## 2022-06-29 MED ORDER — METOPROLOL SUCCINATE ER 100 MG PO TB24: 100.0000 mg | ORAL_TABLET | Freq: Every day | ORAL | 3 refills | Status: DC

## 2022-06-29 NOTE — Progress Notes (Signed)
Cardiology Office Note:    Date:  06/29/2022   ID:  Nicole Sims, DOB 1950/09/30, MRN [id number]  PCP:  Einar Pheasant, MD  [facility] Cardiologist:  Kathlyn Sacramento, MD  [facility] Electrophysiologist:  None   Referring MD: Einar Pheasant, MD   Chief Complaint: 3-monthfollow-up  History of Present Illness:    Nicole GIACOBBEis a 71y.o. female with a hx of chronic diastolic heart failure, paroxysmal SVT, hyperlipidemia, hypertension, sleep apnea on CPAP, and obesity presents today for 1 year follow-up appointment.   The patient was evaluated in 2019 for persistent exertional dyspnea.  Right/left heart cath 03/2018 normal coronary arteries with normal LVEF, RHC with mild elevated filling pressures with pulmonary capillary wedge pressure 30 mmHg, no pulmonary hypertension, normal cardiac output.  CTA of chest no evidence of PE.  Subsequently evaluated for palpitations 6-20 20 with Zio patch showing predominant sinus rhythm with short runs of SVT longest lasting 7 beats.  Toprol was titrated 100 mg once daily with improvement in her symptoms.   Seen by pulmonology 10/2019 with increasing shortness of breath.  Echo at that time with EF 60 to 65%, no R WMA, mild LVH, grade 1 diastolic function, normal RV systolic function, RV cavity size, trivial MR.  VQ scan low probability for PE.   Last seen 10/02/21 for ER follow-up for CP and SOB in the setting of recent COVID infection. CP resolved but she still had DOE so an echo was ordered. Also Crestor was switched to RSheridan Follow-up LDL ws 66.   Last seen 11/27/2021 was overall doing well.  Today, the patient reports she is overall doing well. She reported vague chest pain a couple weeks ago. She took a NTG and this helped. She has felt this type of chest pain before. She reported some shortness of breath. No lower leg edema, orthopnea, pnd. No palpitations.   Past Medical History:  Diagnosis Date   Anemia    Anginal pain (HCC)     Anxiety    Asthma    Cataract cortical, senile    CHF (congestive heart failure) (HCC)    Chronic headaches    Diastolic heart failure (HCC)    Diverticulosis    Environmental allergies    GERD (gastroesophageal reflux disease)    Heart murmur    Hyperglycemia    Hyperlipidemia    Hypertension    Leukocytosis    Obesity    Osteoarthritis    Palpitations    Restless leg syndrome    Sleep apnea    Thrombocytosis    Urinary incontinence    mixed   Venous insufficiency    Vitamin D deficiency    Wears dentures    partial upper    Past Surgical History:  Procedure Laterality Date   BLADDER SURGERY     x2   [place] [facility] [place]   BREAST BIOPSY Left 06/24/2022   Stereo Bx, X-clip, path pending   BREAST BIOPSY Left 06/24/2022   Stereo Bx, ribbon clip, path pending   BREAST BIOPSY  06/24/2022   Stereo Bx, Coil Clip, Path Pending   BREAST BIOPSY Left 06/24/2022   MM LT BREAST BX W LOC DEV 1ST LESION IMAGE BX SPEC STEREO GUIDE 06/24/2022 [facility]   BREAST BIOPSY Left 06/24/2022   MM LT BREAST BX W LOC DEV EA AD LESION IMG BX SPEC STEREO GUIDE 06/24/2022 [facility]   BREAST BIOPSY Left 06/24/2022   MM LT BREAST BX W LOC DEV EA  AD LESION IMG BX SPEC STEREO GUIDE 06/24/2022 [facility]   BREAST CYST ASPIRATION Bilateral 2005   approximate year   CARDIAC CATHETERIZATION     Nehemiah Massed   CATARACT EXTRACTION W/PHACO Right 04/24/2020   Procedure: CATARACT EXTRACTION PHACO AND INTRAOCULAR LENS PLACEMENT ([place]) RIGHT;  Surgeon: Birder Robson, MD;  Location: [place];  Service: Ophthalmology;  Laterality: Right;  6.75 0:37.3   CATARACT EXTRACTION W/PHACO Left 01/27/2021   Procedure: CATARACT EXTRACTION PHACO AND INTRAOCULAR LENS PLACEMENT ([place]) LEFT;  Surgeon: Birder Robson, MD;  Location: [place];  Service: Ophthalmology;  Laterality: Left;  6.75 00:43.9   CERVICAL CONE BIOPSY     CIS   CHOLECYSTECTOMY     COLONOSCOPY WITH PROPOFOL N/A  07/19/2016   Procedure: COLONOSCOPY WITH PROPOFOL;  Surgeon: Manya Silvas, MD;  Location: [facility] ENDOSCOPY;  Service: Endoscopy;  Laterality: N/A;   COLONOSCOPY WITH PROPOFOL N/A 10/20/2018   Procedure: COLONOSCOPY WITH PROPOFOL;  Surgeon: Lollie Sails, MD;  Location: [facility] ENDOSCOPY;  Service: Endoscopy;  Laterality: N/A;   ESOPHAGOGASTRODUODENOSCOPY N/A 05/12/2021   Procedure: ESOPHAGOGASTRODUODENOSCOPY (EGD);  Surgeon: Lesly Rubenstein, MD;  Location: [facility] ENDOSCOPY;  Service: Endoscopy;  Laterality: N/A;   ESOPHAGOGASTRODUODENOSCOPY (EGD) WITH PROPOFOL N/A 07/19/2016   Procedure: ESOPHAGOGASTRODUODENOSCOPY (EGD) WITH PROPOFOL;  Surgeon: Manya Silvas, MD;  Location: [facility] ENDOSCOPY;  Service: Endoscopy;  Laterality: N/A;   FLEXIBLE SIGMOIDOSCOPY     KNEE ARTHROSCOPY  08/13/2008   knee replacement and revision     left   RECTOCELE REPAIR     RIGHT/LEFT HEART CATH AND CORONARY ANGIOGRAPHY Bilateral 04/10/2018   Procedure: RIGHT/LEFT HEART CATH AND CORONARY ANGIOGRAPHY;  Surgeon: Wellington Hampshire, MD;  Location: [place] [facility];  Service: Cardiovascular;  Laterality: Bilateral;   ROTATOR CUFF REPAIR     bilateral   SHOULDER SURGERY  11/17/2005   TONSILLECTOMY  1962   VAGINAL HYSTERECTOMY  1974   abnormal pap and carcinoma in situ    Current Medications: Current Meds  Medication Sig   acetaminophen (TYLENOL) 500 MG tablet Take 500 mg by mouth every 6 (six) hours as needed.   albuterol (VENTOLIN HFA) 108 (90 Base) MCG/ACT inhaler Inhale 2 puffs into the lungs every 4 (four) hours as needed for wheezing or shortness of breath.   aspirin 325 MG tablet Take 325 mg by mouth daily.   budesonide-formoterol (SYMBICORT) 80-4.5 MCG/ACT inhaler Inhale 2 puffs into the lungs 2 (two) times daily.   furosemide (LASIX) 20 MG tablet Take 1 tablet (20 mg) by mouth once daily as needed for weight gain of 3 lbs or more overnight   linaclotide (LINZESS) 290 MCG CAPS capsule Take 290 mcg  by mouth daily before breakfast.   nitrofurantoin, macrocrystal-monohydrate, (MACROBID) 100 MG capsule Take 1 capsule (100 mg total) by mouth 2 (two) times daily.   Omega-3 Fatty Acids (FISH OIL) 1000 MG CAPS Take 1,000 mg by mouth.   ondansetron (ZOFRAN) 4 MG tablet Take 1 tablet (4 mg total) by mouth 2 (two) times daily as needed for nausea or vomiting.   potassium chloride (KLOR-CON) 10 MEQ tablet Take 1 tablet (10 meq) by mouth once daily as needed only when taking lasix (furosemide)   pregabalin (LYRICA) 100 MG capsule Take 100 mg by mouth at bedtime.   pregabalin (LYRICA) 50 MG capsule Take by mouth. Take 1 capsule by mouth in the morning and 1 capsules at afternoon   RABEprazole (ACIPHEX) 20 MG tablet Take 20 mg by mouth 2 (  two) times daily.   REPATHA 140 MG/ML SOSY INJECT 140 MG UNDER THE SKIN EVERY 14 DAYS   sucralfate (CARAFATE) 1 g tablet Take 1 g by mouth daily as needed (GI sympotms).    venlafaxine XR (EFFEXOR-XR) 150 MG 24 hr capsule TAKE 1 CAPSULE BY MOUTH  DAILY WITH BREAKFAST   [DISCONTINUED] metoprolol succinate (TOPROL-XL) 100 MG 24 hr tablet Take 1 tablet (100 mg total) by mouth daily.     Allergies:   Levaquin [levofloxacin], Augmentin [amoxicillin-pot clavulanate], Bactrim [sulfamethoxazole-trimethoprim], Doxycycline, Hydrocodone-acetaminophen, Pseudoephedrine, and Shellfish allergy   Social History   Socioeconomic History   Marital status: Married    Spouse name: Not on file   Number of children: Not on file   Years of education: Not on file   Highest education level: Not on file  Occupational History   Occupation: retired  Tobacco Use   Smoking status: Former    Packs/day: 1.00    Years: 15.00    Total pack years: 15.00    Types: Cigarettes    Quit date: 08/17/1983    Years since quitting: 38.8   Smokeless tobacco: Never  Vaping Use   Vaping Use: Never used  Substance and Sexual Activity   Alcohol use: Yes    Alcohol/week: 0.0 standard drinks of alcohol     Comment: rarely   Drug use: No   Sexual activity: Not on file  Other Topics Concern   Not on file  Social History Narrative   Lives at home with husband   Social Determinants of Health   Financial Resource Strain: Low Risk  (01/21/2022)   Overall Financial Resource Strain (CARDIA)    Difficulty of Paying Living Expenses: Not hard at all  Food Insecurity: No Food Insecurity (01/21/2022)   Hunger Vital Sign    Worried About Running Out of Food in the Last Year: Never true    Ran Out of Food in the Last Year: Never true  Transportation Needs: No Transportation Needs (01/21/2022)   PRAPARE - Hydrologist (Medical): No    Lack of Transportation (Non-Medical): No  Physical Activity: Inactive (01/21/2022)   Exercise Vital Sign    Days of Exercise per Week: 0 days    Minutes of Exercise per Session: 0 min  Stress: No Stress Concern Present (01/21/2022)   [place]    Feeling of Stress : Only a little  Social Connections: Unknown (01/21/2022)   Social Connection and Isolation Panel [NHANES]    Frequency of Communication with Friends and Family: Twice a week    Frequency of Social Gatherings with Friends and Family: Twice a week    Attends Religious Services: Not on Advertising copywriter or Organizations: Not on file    Attends Archivist Meetings: Not on file    Marital Status: Married     Family History: The patient's family history includes Alzheimer's disease in her father, paternal aunt, paternal aunt, paternal uncle, paternal uncle, and paternal uncle; Breast cancer in her maternal aunt; Diabetes Mellitus II in her father; Heart Problems in her brother; Hypertension in her mother; Leukemia in her maternal aunt; Thyroid disease in her father. There is no history of Colon cancer, Kidney cancer, or Bladder Cancer.  ROS:   Please see the history of present illness.     All  other systems reviewed and are negative.  EKGs/Labs/Other Studies Reviewed:    The  following studies were reviewed today:  Echo 10/2021  1. Left ventricular ejection fraction, by estimation, is 65 to 70%. The  left ventricle has normal function. The left ventricle has no regional  Whitner motion abnormalities. Left ventricular diastolic parameters were  normal. The average left ventricular  global longitudinal strain is -21.2 %. The global longitudinal strain is  normal.   2. Right ventricular systolic function is normal. The right ventricular  size is normal.   3. The mitral valve is normal in structure. No evidence of mitral valve  regurgitation.   4. The aortic valve is tricuspid. Aortic valve regurgitation is not  visualized.   5. The inferior vena cava is normal in size with greater than 50%  respiratory variability, suggesting right atrial pressure of 3 mmHg.   EKG:  EKG is ordered today.  The ekg ordered today demonstrates NSR 62vpm, TWI V3  Recent Labs: 02/25/2022: TSH 2.56 05/31/2022: ALT 11; BUN 13; Creatinine, Ser 1.08; Hemoglobin 12.4; Platelets 426.0; Potassium 4.6; Sodium 143  Recent Lipid Panel    Component Value Date/Time   CHOL 170 05/31/2022 0855   CHOL 145 11/17/2021 0941   TRIG 159.0 (H) 05/31/2022 0855   HDL 53.30 05/31/2022 0855   HDL 60 11/17/2021 0941   CHOLHDL 3 05/31/2022 0855   VLDL 31.8 05/31/2022 0855   LDLCALC 85 05/31/2022 0855   LDLCALC 66 11/17/2021 0941   LDLDIRECT 172.8 09/17/2013 0950     Physical Exam:    VS:  BP 130/78 (BP Location: Left Arm, Patient Position: Sitting, Cuff Size: Large)   Pulse 63   Ht '5\' 5"'$  (1.651 m)   Wt 236 lb 9.6 oz (107.3 kg)   SpO2 97%   BMI 39.37 kg/m     Wt Readings from Last 3 Encounters:  06/29/22 236 lb 9.6 oz (107.3 kg)  06/03/22 236 lb 12.8 oz (107.4 kg)  03/23/22 237 lb 8 oz (107.7 kg)     GEN:  Well nourished, well developed in no acute distress HEENT: Normal NECK: No JVD; No carotid  bruits LYMPHATICS: No lymphadenopathy CARDIAC: RRR, no murmurs, rubs, gallops RESPIRATORY:  Clear to auscultation without rales, wheezing or rhonchi  ABDOMEN: Soft, non-tender, non-distended MUSCULOSKELETAL:  No edema; No deformity  SKIN: Warm and dry NEUROLOGIC:  Alert and oriented x 3 PSYCHIATRIC:  Normal affect   ASSESSMENT:    1. Essential hypertension   2. Chest pain, unspecified type   3. Chronic diastolic heart failure (HCC)   4. Paroxysmal SVT (supraventricular tachycardia)   5. Hyperlipidemia, mixed    PLAN:    In order of problems listed above:  Chest pain The patient reports intermittent chest pain relieved by nitroglycerin.  We had a long discussion of options. Left heart cath in 2019 showed normal coronary arteries. EKG today showed no ischemic changes today. We discussed medications, stress test, and observation. She does not want to try Imdur due to history of headaches. We decided to watch and wait for now. She will let us know if chest pain worsens or changes.  Continue aspirin, beta-blocker, and Repatha.  Chronic diastolic heart failure Patient is euvolemic on exam.  Echo in May 2023 showed LVEF 65 to 70%, no Mcguirt motion abnormalities, normal RV function.  Continue Lasix 20 mg as needed for swelling.  Continue Toprol 100 mg daily.  Hyperlipidemia LDL 66, continue Repatha.  pSVT Patient denies significant palpitations.  Continue beta-blocker therapy.  Disposition: Follow up in 6 month(s) with MD/APP  Signed, Brissa Asante Ninfa Meeker, PA-C  06/29/2022 12:14 PM    [facility]

## 2022-06-29 NOTE — Patient Instructions (Signed)
Medication Instructions:  Your Physician recommend you continue on your current medication as directed.    *If you need a refill on your cardiac medications before your next appointment, please call your pharmacy*   Lab Work: None ordered today   Testing/Procedures: None ordered today   Follow-Up: At [facility], you and your health needs are our priority.  As part of our continuing mission to provide you with exceptional heart care, we have created designated Provider Care Teams.  These Care Teams include your primary Cardiologist (physician) and Advanced Practice Providers (APPs -  Physician Assistants and Nurse Practitioners) who all work together to provide you with the care you need, when you need it.  We recommend signing up for the patient portal called "MyChart".  Sign up information is provided on this After Visit Summary.  MyChart is used to connect with patients for Virtual Visits (Telemedicine).  Patients are able to view lab/test results, encounter notes, upcoming appointments, etc.  Non-urgent messages can be sent to your provider as well.   To learn more about what you can do with MyChart, go to [email or web address].    Your next appointment:   6 month(s)  The format for your next appointment:   In Person  Provider:   Kathlyn Sacramento, MD

## 2022-06-30 ENCOUNTER — Encounter: Payer: Self-pay | Admitting: Internal Medicine

## 2022-06-30 DIAGNOSIS — R928 Other abnormal and inconclusive findings on diagnostic imaging of breast: Secondary | ICD-10-CM | POA: Insufficient documentation

## 2022-07-01 ENCOUNTER — Telehealth: Payer: Self-pay | Admitting: Internal Medicine

## 2022-07-01 NOTE — Telephone Encounter (Signed)
Pt returning call

## 2022-07-01 NOTE — Telephone Encounter (Signed)
See result note.  

## 2022-07-02 ENCOUNTER — Other Ambulatory Visit: Payer: Self-pay | Admitting: Gastroenterology

## 2022-07-02 DIAGNOSIS — R1314 Dysphagia, pharyngoesophageal phase: Secondary | ICD-10-CM

## 2022-07-15 DIAGNOSIS — N6489 Other specified disorders of breast: Secondary | ICD-10-CM | POA: Diagnosis not present

## 2022-07-15 DIAGNOSIS — N6092 Unspecified benign mammary dysplasia of left breast: Secondary | ICD-10-CM | POA: Diagnosis not present

## 2022-07-22 ENCOUNTER — Inpatient Hospital Stay: Payer: Medicare Other | Attending: Oncology | Admitting: Oncology

## 2022-07-22 ENCOUNTER — Encounter: Payer: Self-pay | Admitting: Internal Medicine

## 2022-07-22 ENCOUNTER — Encounter: Payer: Self-pay | Admitting: Oncology

## 2022-07-22 VITALS — BP 143/76 | HR 70 | Temp 97.7°F | Resp 20 | Ht 65.0 in | Wt 234.0 lb

## 2022-07-22 DIAGNOSIS — Z9049 Acquired absence of other specified parts of digestive tract: Secondary | ICD-10-CM | POA: Insufficient documentation

## 2022-07-22 DIAGNOSIS — Z881 Allergy status to other antibiotic agents status: Secondary | ICD-10-CM | POA: Diagnosis not present

## 2022-07-22 DIAGNOSIS — Z818 Family history of other mental and behavioral disorders: Secondary | ICD-10-CM | POA: Insufficient documentation

## 2022-07-22 DIAGNOSIS — Z833 Family history of diabetes mellitus: Secondary | ICD-10-CM | POA: Insufficient documentation

## 2022-07-22 DIAGNOSIS — Z79899 Other long term (current) drug therapy: Secondary | ICD-10-CM | POA: Diagnosis not present

## 2022-07-22 DIAGNOSIS — I110 Hypertensive heart disease with heart failure: Secondary | ICD-10-CM | POA: Insufficient documentation

## 2022-07-22 DIAGNOSIS — Z803 Family history of malignant neoplasm of breast: Secondary | ICD-10-CM | POA: Diagnosis not present

## 2022-07-22 DIAGNOSIS — N6489 Other specified disorders of breast: Secondary | ICD-10-CM | POA: Diagnosis not present

## 2022-07-22 DIAGNOSIS — Z885 Allergy status to narcotic agent status: Secondary | ICD-10-CM | POA: Insufficient documentation

## 2022-07-22 DIAGNOSIS — Z8349 Family history of other endocrine, nutritional and metabolic diseases: Secondary | ICD-10-CM | POA: Insufficient documentation

## 2022-07-22 DIAGNOSIS — I5032 Chronic diastolic (congestive) heart failure: Secondary | ICD-10-CM | POA: Diagnosis not present

## 2022-07-22 DIAGNOSIS — Z806 Family history of leukemia: Secondary | ICD-10-CM | POA: Insufficient documentation

## 2022-07-22 DIAGNOSIS — Z880 Allergy status to penicillin: Secondary | ICD-10-CM | POA: Diagnosis not present

## 2022-07-22 DIAGNOSIS — Z6838 Body mass index (BMI) 38.0-38.9, adult: Secondary | ICD-10-CM | POA: Diagnosis not present

## 2022-07-22 DIAGNOSIS — I872 Venous insufficiency (chronic) (peripheral): Secondary | ICD-10-CM | POA: Diagnosis not present

## 2022-07-22 DIAGNOSIS — Z8249 Family history of ischemic heart disease and other diseases of the circulatory system: Secondary | ICD-10-CM | POA: Insufficient documentation

## 2022-07-22 DIAGNOSIS — Z780 Asymptomatic menopausal state: Secondary | ICD-10-CM

## 2022-07-22 DIAGNOSIS — N6099 Unspecified benign mammary dysplasia of unspecified breast: Secondary | ICD-10-CM | POA: Insufficient documentation

## 2022-07-22 MED ORDER — LETROZOLE 2.5 MG PO TABS: 2.5000 mg | ORAL_TABLET | Freq: Every day | ORAL | 3 refills | Status: DC

## 2022-07-22 MED ORDER — ANASTROZOLE 1 MG PO TABS: 1.0000 mg | ORAL_TABLET | Freq: Every day | ORAL | 3 refills | Status: DC

## 2022-07-22 NOTE — Progress Notes (Signed)
[place]  [phone] Fax:([phone]) [phone]  ID: Nicole Sims OB: 1950/08/23  MR#: [id number]  AOZ#:[id number]  Patient Care Team: Einar Pheasant, MD as PCP - General (Internal Medicine) Wellington Hampshire, MD as PCP - Cardiology (Cardiology)  CHIEF COMPLAINT: Atypical hyperplasia  INTERVAL HISTORY: Patient is a 71 year old female who recently underwent breast biopsy.  No malignancy was noted, but she did have a small portion of atypical hyperplasia.  She is referred for further evaluation and discussion of chemoprevention for high risk breast cancer.  She currently feels well and is asymptomatic.  She has no neurologic complaints.  She denies any recent fevers or illnesses.  She has a good appetite and denies weight loss.  She has no chest pain, shortness of breath, cough, or hemoptysis.  She denies any nausea, vomiting, constipation, or diarrhea.  She has no urinary complaints.  Patient feels at her baseline and offers no specific complaints today.  REVIEW OF SYSTEMS:   Review of Systems  Constitutional: Negative.  Negative for fever, malaise/fatigue and weight loss.  Respiratory: Negative.  Negative for hemoptysis and shortness of breath.   Cardiovascular: Negative.  Negative for chest pain and leg swelling.  Gastrointestinal: Negative.  Negative for abdominal pain.  Genitourinary: Negative.  Negative for dysuria.  Musculoskeletal: Negative.  Negative for back pain.  Skin: Negative.  Negative for rash.  Neurological: Negative.  Negative for dizziness, focal weakness, weakness and headaches.  Psychiatric/Behavioral: Negative.  The patient is not nervous/anxious.     As per HPI. Otherwise, a complete review of systems is negative.  PAST MEDICAL HISTORY: Past Medical History:  Diagnosis Date   Anemia    Anginal pain (HCC)    Anxiety    Asthma    Cataract cortical, senile    CHF (congestive heart failure) (HCC)    Chronic headaches    Diastolic  heart failure (HCC)    Diverticulosis    Environmental allergies    GERD (gastroesophageal reflux disease)    Heart murmur    Hyperglycemia    Hyperlipidemia    Hypertension    Leukocytosis    Obesity    Osteoarthritis    Palpitations    Restless leg syndrome    Sleep apnea    Thrombocytosis    Urinary incontinence    mixed   Venous insufficiency    Vitamin D deficiency    Wears dentures    partial upper    PAST SURGICAL HISTORY: Past Surgical History:  Procedure Laterality Date   BLADDER SURGERY     x2   [facility]   BREAST BIOPSY Left 06/24/2022   Stereo Bx, X-clip, path pending   BREAST BIOPSY Left 06/24/2022   Stereo Bx, ribbon clip, path pending   BREAST BIOPSY  06/24/2022   Stereo Bx, Coil Clip, Path Pending   BREAST BIOPSY Left 06/24/2022   MM LT BREAST BX W LOC DEV 1ST LESION IMAGE BX SPEC STEREO GUIDE 06/24/2022 [facility]   BREAST BIOPSY Left 06/24/2022   MM LT BREAST BX W LOC DEV EA AD LESION IMG BX SPEC STEREO GUIDE 06/24/2022 [facility]   BREAST BIOPSY Left 06/24/2022   MM LT BREAST BX W LOC DEV EA AD LESION IMG BX SPEC STEREO GUIDE 06/24/2022 [facility]   BREAST CYST ASPIRATION Bilateral 2005   approximate year   CARDIAC CATHETERIZATION     Nehemiah Massed   CATARACT EXTRACTION W/PHACO Right 04/24/2020   Procedure: CATARACT EXTRACTION PHACO AND INTRAOCULAR LENS PLACEMENT ([place]) RIGHT;  Surgeon: Birder Robson, MD;  Location: [facility];  Service: Ophthalmology;  Laterality: Right;  6.75 0:37.3   CATARACT EXTRACTION W/PHACO Left 01/27/2021   Procedure: CATARACT EXTRACTION PHACO AND INTRAOCULAR LENS PLACEMENT ([place]) LEFT;  Surgeon: Birder Robson, MD;  Location: [place];  Service: Ophthalmology;  Laterality: Left;  6.75 00:43.9   CERVICAL CONE BIOPSY     CIS   CHOLECYSTECTOMY     COLONOSCOPY WITH PROPOFOL N/A 07/19/2016   Procedure: COLONOSCOPY WITH PROPOFOL;  Surgeon: Manya Silvas, MD;  Location: [facility]  ENDOSCOPY;  Service: Endoscopy;  Laterality: N/A;   COLONOSCOPY WITH PROPOFOL N/A 10/20/2018   Procedure: COLONOSCOPY WITH PROPOFOL;  Surgeon: Lollie Sails, MD;  Location: [facility] ENDOSCOPY;  Service: Endoscopy;  Laterality: N/A;   ESOPHAGOGASTRODUODENOSCOPY N/A 05/12/2021   Procedure: ESOPHAGOGASTRODUODENOSCOPY (EGD);  Surgeon: Lesly Rubenstein, MD;  Location: [facility] ENDOSCOPY;  Service: Endoscopy;  Laterality: N/A;   ESOPHAGOGASTRODUODENOSCOPY (EGD) WITH PROPOFOL N/A 07/19/2016   Procedure: ESOPHAGOGASTRODUODENOSCOPY (EGD) WITH PROPOFOL;  Surgeon: Manya Silvas, MD;  Location: [facility] ENDOSCOPY;  Service: Endoscopy;  Laterality: N/A;   FLEXIBLE SIGMOIDOSCOPY     KNEE ARTHROSCOPY  08/13/2008   knee replacement and revision     left   RECTOCELE REPAIR     RIGHT/LEFT HEART CATH AND CORONARY ANGIOGRAPHY Bilateral 04/10/2018   Procedure: RIGHT/LEFT HEART CATH AND CORONARY ANGIOGRAPHY;  Surgeon: Wellington Hampshire, MD;  Location: [place] [facility];  Service: Cardiovascular;  Laterality: Bilateral;   ROTATOR CUFF REPAIR     bilateral   SHOULDER SURGERY  11/17/2005   TONSILLECTOMY  1962   VAGINAL HYSTERECTOMY  1974   abnormal pap and carcinoma in situ    FAMILY HISTORY: Family History  Problem Relation Age of Onset   Diabetes Mellitus II Father    Thyroid disease Father    Alzheimer's disease Father    Breast cancer Maternal Aunt    Hypertension Mother    Heart Problems Brother    Leukemia Maternal Aunt    Alzheimer's disease Paternal Aunt    Alzheimer's disease Paternal Uncle    Alzheimer's disease Paternal Uncle    Alzheimer's disease Paternal Uncle    Alzheimer's disease Paternal Aunt    Colon cancer Neg Hx    Kidney cancer Neg Hx    Bladder Cancer Neg Hx     ADVANCED DIRECTIVES (Y/N):  N  HEALTH MAINTENANCE: Social History   Tobacco Use   Smoking status: Former    Packs/day: 1.00    Years: 15.00    Total pack years: 15.00    Types: Cigarettes    Quit date:  08/17/1983    Years since quitting: 38.9   Smokeless tobacco: Never  Vaping Use   Vaping Use: Never used  Substance Use Topics   Alcohol use: Yes    Alcohol/week: 0.0 standard drinks of alcohol    Comment: rarely   Drug use: No     Colonoscopy:  PAP:  Bone density:  Lipid panel:  Allergies  Allergen Reactions   Levaquin [Levofloxacin]    Augmentin [Amoxicillin-Pot Clavulanate] Other (See Comments)    Questionable itching   Bactrim [Sulfamethoxazole-Trimethoprim] Rash   Doxycycline Itching   Hydrocodone-Acetaminophen Other (See Comments)    GI distress   Pseudoephedrine Rash    Itching of the scalp   Shellfish Allergy Nausea And Vomiting    Nausea and vomiting     Current Outpatient Medications  Medication Sig Dispense Refill   acetaminophen (TYLENOL) 500 MG tablet Take  500 mg by mouth every 6 (six) hours as needed.     albuterol (VENTOLIN HFA) 108 (90 Base) MCG/ACT inhaler Inhale 2 puffs into the lungs every 4 (four) hours as needed for wheezing or shortness of breath. 1 each 0   aspirin 325 MG tablet Take 325 mg by mouth daily.     budesonide-formoterol (SYMBICORT) 80-4.5 MCG/ACT inhaler Inhale 2 puffs into the lungs 2 (two) times daily. 1 each 0   furosemide (LASIX) 20 MG tablet Take 1 tablet (20 mg) by mouth once daily as needed for weight gain of 3 lbs or more overnight     linaclotide (LINZESS) 290 MCG CAPS capsule Take 290 mcg by mouth daily before breakfast.     metoprolol succinate (TOPROL-XL) 100 MG 24 hr tablet Take 1 tablet (100 mg total) by mouth daily. 90 tablet 3   Omega-3 Fatty Acids (FISH OIL) 1000 MG CAPS Take 1,000 mg by mouth.     ondansetron (ZOFRAN) 4 MG tablet Take 1 tablet (4 mg total) by mouth 2 (two) times daily as needed for nausea or vomiting. 15 tablet 0   potassium chloride (KLOR-CON) 10 MEQ tablet Take 1 tablet (10 meq) by mouth once daily as needed only when taking lasix (furosemide)     pregabalin (LYRICA) 100 MG capsule Take 100 mg by mouth  at bedtime.     pregabalin (LYRICA) 50 MG capsule Take by mouth. Take 1 capsule by mouth in the morning and 1 capsules at afternoon     RABEprazole (ACIPHEX) 20 MG tablet Take 20 mg by mouth 2 (two) times daily.     REPATHA 140 MG/ML SOSY INJECT 140 MG UNDER THE SKIN EVERY 14 DAYS 2 mL 11   sucralfate (CARAFATE) 1 g tablet Take 1 g by mouth daily as needed (GI sympotms).      venlafaxine XR (EFFEXOR-XR) 150 MG 24 hr capsule TAKE 1 CAPSULE BY MOUTH  DAILY WITH BREAKFAST 90 capsule 3   nitrofurantoin, macrocrystal-monohydrate, (MACROBID) 100 MG capsule Take 1 capsule (100 mg total) by mouth 2 (two) times daily. (Patient not taking: Reported on 07/22/2022) 10 capsule 0   No current facility-administered medications for this visit.    OBJECTIVE: Vitals:   07/22/22 1449  BP: (!) 143/76  Pulse: 70  Resp: 20  Temp: 97.7 F (36.5 C)  SpO2: 100%     Body mass index is 38.94 kg/m.    ECOG FS:0 - Asymptomatic  General: Well-developed, well-nourished, no acute distress. Eyes: Pink conjunctiva, anicteric sclera. HEENT: Normocephalic, moist mucous membranes. Lungs: No audible wheezing or coughing. Heart: Regular rate and rhythm. Abdomen: Soft, nontender, no obvious distention. Musculoskeletal: No edema, cyanosis, or clubbing. Neuro: Alert, answering all questions appropriately. Cranial nerves grossly intact. Skin: No rashes or petechiae noted. Psych: Normal affect. Lymphatics: No cervical, calvicular, axillary or inguinal LAD.   LAB RESULTS:  Lab Results  Component Value Date   NA 143 05/31/2022   K 4.6 05/31/2022   CL 106 05/31/2022   CO2 26 05/31/2022   GLUCOSE 92 05/31/2022   BUN 13 05/31/2022   CREATININE 1.08 05/31/2022   CALCIUM 9.3 05/31/2022   PROT 7.2 05/31/2022   ALBUMIN 4.0 05/31/2022   AST 17 05/31/2022   ALT 11 05/31/2022   ALKPHOS 91 05/31/2022   BILITOT 0.5 05/31/2022   GFRNONAA >60 09/28/2021   GFRAA >60 10/24/2019    Lab Results  Component Value Date    WBC 7.4 05/31/2022   NEUTROABS 2.9 05/31/2022  HGB 12.4 05/31/2022   HCT 38.5 05/31/2022   MCV 86.8 05/31/2022   PLT 426.0 (H) 05/31/2022     STUDIES: MM LT BREAST BX W LOC DEV 1ST LESION IMAGE BX SPEC STEREO GUIDE  Addendum Date: 06/29/2022   ADDENDUM REPORT: 06/29/2022 15:07 ADDENDUM: PATHOLOGY revealed: Site A. BREAST, LEFT, "SLIGHTLY LATERAL"; STEREOTACTIC CORE NEEDLE BIOPSY:- BENIGN MAMMARY PARENCHYMA WITH FIBROCYSTIC CHANGES AND ASSOCIATED MICROCALCIFICATIONS. NEGATIVE FOR ATYPICAL PROLIFERATIVE BREAST DISEASE. Pathology results are CONCORDANT with imaging findings, per Dr. Claudie Revering. PATHOLOGY revealed: Site B. BREAST, LEFT LATERAL; STEREOTACTIC CORE NEEDLE BIOPSY:- BENIGN MAMMARY PARENCHYMA WITH FIBROCYSTIC CHANGES AND FOCAL PSEUDOANGIOMATOUS STROMAL HYPERPLASIA.- FOCAL ATYPICAL LOBULAR HYPERPLASIA.- CALCIFICATIONS ASSOCIATED WITH BENIGN MAMMARY ELEMENTS.- NEGATIVE FOR ATYPICAL DUCTAL HYPERPLASIA, CARCINOMA IN SITU, AND MALIGNANCY. Pathology results are CONCORDANT with imaging findings per Dr. Claudie Revering, with consideration of excision recommended. PATHOLOGY revealed: Site C. BREAST, LEFT MEDIAL; STEREOTACTIC CORE NEEDLE BIOPSY:- BENIGN MAMMARY PARENCHYMA WITH STROMAL FIBROSIS AND FIBROCYSTIC CHANGES, WITH ASSOCIATED MICROCALCIFICATIONS. - NEGATIVE FOR ATYPICAL PROLIFERATIVE BREAST DISEASE. Pathology results are CONCORDANT with imaging findings, per Dr. Claudie Revering. Pathology results and recommendations below were discussed with patient by telephone on 06/25/2022. Patient reported biopsy site within normal limits with slight tenderness at the site. Post biopsy care instructions were reviewed, questions were answered and my direct phone number was provided to patient. Patient was instructed to call [facility] if any concerns or questions arise related to the biopsy. RECOMMENDATIONS: 1. Surgical consultation for consideration of excision for Site B only. NOTE: If excision is  recommended, a fourth group of LEFT breast calcifications and a group of RIGHT breast calcifications will need to be biopsied before surgery. Request for surgical consultation relayed to Casper Harrison RN at [facility] by Electa Sniff RN on 06/25/2022. 2. If excision not performed, the patient needs to return in six months for bilateral diagnostic mammogram to reassess the additional LEFT breast group of calcifications and RIGHT breast group of calcifications. The patient is aware that she will need to call the mammography site to schedule this appointment. Pathology results reported by Electa Sniff RN on 06/28/2022. Electronically Signed   By: Claudie Revering M.D.   On: 06/29/2022 15:07   Result Date: 06/29/2022 CLINICAL DATA:  Three groups of indeterminate calcifications in the left breast, 1 group of probably benign calcifications in the left breast and 1 group of probably benign calcifications in the right breast at recent mammography. EXAM: LEFT BREAST STEREOTACTIC CORE NEEDLE BIOPSY X 3 COMPARISON:  Previous exam(s). FINDINGS: The patient and I discussed the procedure of stereotactic-guided biopsy including benefits and alternatives. We discussed the high likelihood of successful procedures. We discussed the risks of the procedures including infection, bleeding, tissue injury, clip migration, and inadequate sampling. Informed written consent was given. The usual time out protocol was performed immediately prior to the procedures. SITE 1: 4 MM GROUP OF CALCIFICATIONS IN THE SLIGHTLY LATERAL LEFT BREAST Using sterile technique and 1% Lidocaine as local anesthetic, under stereotactic guidance, a 9 gauge vacuum assisted device was used to perform core needle biopsy of 4 mm group of calcifications in the slightly lateral left breast using a cephalad approach. Specimen radiograph was performed showing multiple calcifications in the specimens. Specimens with calcifications are identified for pathology. At  the conclusion of the procedure, an X shaped tissue marker clip was deployed into the biopsy cavity. SITE 2: 4 MM GROUP CALCIFICATIONS IN THE LATERAL LEFT BREAST Using sterile technique and 1% Lidocaine as local anesthetic,  under stereotactic guidance, a 9 gauge vacuum assisted device was used to perform core needle biopsy of a 4 mm group of calcifications in the lateral left breast using a cephalad approach. Specimen radiograph was performed showing multiple calcifications in the specimens. Specimens with calcifications are identified for pathology. At the conclusion of the procedure, a ribbon shaped tissue marker clip was deployed into the biopsy cavity. SITE 3: 13 MM GROUP OF CALCIFICATIONS IN THE MEDIAL LEFT BREAST Using sterile technique and 1% Lidocaine as local anesthetic, under stereotactic guidance, a 9 gauge vacuum assisted device was used to perform core needle biopsy of a 13 mm group calcifications in the medial left breast using a cephalad approach. Specimen radiograph was performed showing multiple calcifications in the specimens. Specimens with calcifications are identified for pathology. At the conclusion of the procedure, a coil shaped tissue marker clip was deployed into the biopsy cavity. Follow-up 2-view mammogram was performed and dictated separately. IMPRESSION: Stereotactic-guided biopsies of 3 groups of left breast calcifications. No apparent complications. Electronically Signed: By: Claudie Revering M.D. On: 06/24/2022 11:29   MM LT BREAST BX W LOC DEV EA AD LESION IMG BX SPEC STEREO GUIDE  Addendum Date: 06/29/2022   ADDENDUM REPORT: 06/29/2022 15:07 ADDENDUM: PATHOLOGY revealed: Site A. BREAST, LEFT, "SLIGHTLY LATERAL"; STEREOTACTIC CORE NEEDLE BIOPSY:- BENIGN MAMMARY PARENCHYMA WITH FIBROCYSTIC CHANGES AND ASSOCIATED MICROCALCIFICATIONS. NEGATIVE FOR ATYPICAL PROLIFERATIVE BREAST DISEASE. Pathology results are CONCORDANT with imaging findings, per Dr. Claudie Revering. PATHOLOGY revealed:  Site B. BREAST, LEFT LATERAL; STEREOTACTIC CORE NEEDLE BIOPSY:- BENIGN MAMMARY PARENCHYMA WITH FIBROCYSTIC CHANGES AND FOCAL PSEUDOANGIOMATOUS STROMAL HYPERPLASIA.- FOCAL ATYPICAL LOBULAR HYPERPLASIA.- CALCIFICATIONS ASSOCIATED WITH BENIGN MAMMARY ELEMENTS.- NEGATIVE FOR ATYPICAL DUCTAL HYPERPLASIA, CARCINOMA IN SITU, AND MALIGNANCY. Pathology results are CONCORDANT with imaging findings per Dr. Claudie Revering, with consideration of excision recommended. PATHOLOGY revealed: Site C. BREAST, LEFT MEDIAL; STEREOTACTIC CORE NEEDLE BIOPSY:- BENIGN MAMMARY PARENCHYMA WITH STROMAL FIBROSIS AND FIBROCYSTIC CHANGES, WITH ASSOCIATED MICROCALCIFICATIONS. - NEGATIVE FOR ATYPICAL PROLIFERATIVE BREAST DISEASE. Pathology results are CONCORDANT with imaging findings, per Dr. Claudie Revering. Pathology results and recommendations below were discussed with patient by telephone on 06/25/2022. Patient reported biopsy site within normal limits with slight tenderness at the site. Post biopsy care instructions were reviewed, questions were answered and my direct phone number was provided to patient. Patient was instructed to call [facility] if any concerns or questions arise related to the biopsy. RECOMMENDATIONS: 1. Surgical consultation for consideration of excision for Site B only. NOTE: If excision is recommended, a fourth group of LEFT breast calcifications and a group of RIGHT breast calcifications will need to be biopsied before surgery. Request for surgical consultation relayed to Casper Harrison RN at [facility] by Electa Sniff RN on 06/25/2022. 2. If excision not performed, the patient needs to return in six months for bilateral diagnostic mammogram to reassess the additional LEFT breast group of calcifications and RIGHT breast group of calcifications. The patient is aware that she will need to call the mammography site to schedule this appointment. Pathology results reported by Electa Sniff RN on 06/28/2022.  Electronically Signed   By: Claudie Revering M.D.   On: 06/29/2022 15:07   Result Date: 06/29/2022 CLINICAL DATA:  Three groups of indeterminate calcifications in the left breast, 1 group of probably benign calcifications in the left breast and 1 group of probably benign calcifications in the right breast at recent mammography. EXAM: LEFT BREAST STEREOTACTIC CORE NEEDLE BIOPSY X 3 COMPARISON:  Previous exam(s). FINDINGS: The  patient and I discussed the procedure of stereotactic-guided biopsy including benefits and alternatives. We discussed the high likelihood of successful procedures. We discussed the risks of the procedures including infection, bleeding, tissue injury, clip migration, and inadequate sampling. Informed written consent was given. The usual time out protocol was performed immediately prior to the procedures. SITE 1: 4 MM GROUP OF CALCIFICATIONS IN THE SLIGHTLY LATERAL LEFT BREAST Using sterile technique and 1% Lidocaine as local anesthetic, under stereotactic guidance, a 9 gauge vacuum assisted device was used to perform core needle biopsy of 4 mm group of calcifications in the slightly lateral left breast using a cephalad approach. Specimen radiograph was performed showing multiple calcifications in the specimens. Specimens with calcifications are identified for pathology. At the conclusion of the procedure, an X shaped tissue marker clip was deployed into the biopsy cavity. SITE 2: 4 MM GROUP CALCIFICATIONS IN THE LATERAL LEFT BREAST Using sterile technique and 1% Lidocaine as local anesthetic, under stereotactic guidance, a 9 gauge vacuum assisted device was used to perform core needle biopsy of a 4 mm group of calcifications in the lateral left breast using a cephalad approach. Specimen radiograph was performed showing multiple calcifications in the specimens. Specimens with calcifications are identified for pathology. At the conclusion of the procedure, a ribbon shaped tissue marker clip was  deployed into the biopsy cavity. SITE 3: 13 MM GROUP OF CALCIFICATIONS IN THE MEDIAL LEFT BREAST Using sterile technique and 1% Lidocaine as local anesthetic, under stereotactic guidance, a 9 gauge vacuum assisted device was used to perform core needle biopsy of a 13 mm group calcifications in the medial left breast using a cephalad approach. Specimen radiograph was performed showing multiple calcifications in the specimens. Specimens with calcifications are identified for pathology. At the conclusion of the procedure, a coil shaped tissue marker clip was deployed into the biopsy cavity. Follow-up 2-view mammogram was performed and dictated separately. IMPRESSION: Stereotactic-guided biopsies of 3 groups of left breast calcifications. No apparent complications. Electronically Signed: By: Claudie Revering M.D. On: 06/24/2022 11:29   MM LT BREAST BX W LOC DEV EA AD LESION IMG BX SPEC STEREO GUIDE  Addendum Date: 06/29/2022   ADDENDUM REPORT: 06/29/2022 15:07 ADDENDUM: PATHOLOGY revealed: Site A. BREAST, LEFT, "SLIGHTLY LATERAL"; STEREOTACTIC CORE NEEDLE BIOPSY:- BENIGN MAMMARY PARENCHYMA WITH FIBROCYSTIC CHANGES AND ASSOCIATED MICROCALCIFICATIONS. NEGATIVE FOR ATYPICAL PROLIFERATIVE BREAST DISEASE. Pathology results are CONCORDANT with imaging findings, per Dr. Claudie Revering. PATHOLOGY revealed: Site B. BREAST, LEFT LATERAL; STEREOTACTIC CORE NEEDLE BIOPSY:- BENIGN MAMMARY PARENCHYMA WITH FIBROCYSTIC CHANGES AND FOCAL PSEUDOANGIOMATOUS STROMAL HYPERPLASIA.- FOCAL ATYPICAL LOBULAR HYPERPLASIA.- CALCIFICATIONS ASSOCIATED WITH BENIGN MAMMARY ELEMENTS.- NEGATIVE FOR ATYPICAL DUCTAL HYPERPLASIA, CARCINOMA IN SITU, AND MALIGNANCY. Pathology results are CONCORDANT with imaging findings per Dr. Claudie Revering, with consideration of excision recommended. PATHOLOGY revealed: Site C. BREAST, LEFT MEDIAL; STEREOTACTIC CORE NEEDLE BIOPSY:- BENIGN MAMMARY PARENCHYMA WITH STROMAL FIBROSIS AND FIBROCYSTIC CHANGES, WITH ASSOCIATED  MICROCALCIFICATIONS. - NEGATIVE FOR ATYPICAL PROLIFERATIVE BREAST DISEASE. Pathology results are CONCORDANT with imaging findings, per Dr. Claudie Revering. Pathology results and recommendations below were discussed with patient by telephone on 06/25/2022. Patient reported biopsy site within normal limits with slight tenderness at the site. Post biopsy care instructions were reviewed, questions were answered and my direct phone number was provided to patient. Patient was instructed to call [facility] if any concerns or questions arise related to the biopsy. RECOMMENDATIONS: 1. Surgical consultation for consideration of excision for Site B only. NOTE: If excision is recommended, a fourth group of LEFT  breast calcifications and a group of RIGHT breast calcifications will need to be biopsied before surgery. Request for surgical consultation relayed to Casper Harrison RN at [facility] by Electa Sniff RN on 06/25/2022. 2. If excision not performed, the patient needs to return in six months for bilateral diagnostic mammogram to reassess the additional LEFT breast group of calcifications and RIGHT breast group of calcifications. The patient is aware that she will need to call the mammography site to schedule this appointment. Pathology results reported by Electa Sniff RN on 06/28/2022. Electronically Signed   By: Claudie Revering M.D.   On: 06/29/2022 15:07   Result Date: 06/29/2022 CLINICAL DATA:  Three groups of indeterminate calcifications in the left breast, 1 group of probably benign calcifications in the left breast and 1 group of probably benign calcifications in the right breast at recent mammography. EXAM: LEFT BREAST STEREOTACTIC CORE NEEDLE BIOPSY X 3 COMPARISON:  Previous exam(s). FINDINGS: The patient and I discussed the procedure of stereotactic-guided biopsy including benefits and alternatives. We discussed the high likelihood of successful procedures. We discussed the risks of the  procedures including infection, bleeding, tissue injury, clip migration, and inadequate sampling. Informed written consent was given. The usual time out protocol was performed immediately prior to the procedures. SITE 1: 4 MM GROUP OF CALCIFICATIONS IN THE SLIGHTLY LATERAL LEFT BREAST Using sterile technique and 1% Lidocaine as local anesthetic, under stereotactic guidance, a 9 gauge vacuum assisted device was used to perform core needle biopsy of 4 mm group of calcifications in the slightly lateral left breast using a cephalad approach. Specimen radiograph was performed showing multiple calcifications in the specimens. Specimens with calcifications are identified for pathology. At the conclusion of the procedure, an X shaped tissue marker clip was deployed into the biopsy cavity. SITE 2: 4 MM GROUP CALCIFICATIONS IN THE LATERAL LEFT BREAST Using sterile technique and 1% Lidocaine as local anesthetic, under stereotactic guidance, a 9 gauge vacuum assisted device was used to perform core needle biopsy of a 4 mm group of calcifications in the lateral left breast using a cephalad approach. Specimen radiograph was performed showing multiple calcifications in the specimens. Specimens with calcifications are identified for pathology. At the conclusion of the procedure, a ribbon shaped tissue marker clip was deployed into the biopsy cavity. SITE 3: 13 MM GROUP OF CALCIFICATIONS IN THE MEDIAL LEFT BREAST Using sterile technique and 1% Lidocaine as local anesthetic, under stereotactic guidance, a 9 gauge vacuum assisted device was used to perform core needle biopsy of a 13 mm group calcifications in the medial left breast using a cephalad approach. Specimen radiograph was performed showing multiple calcifications in the specimens. Specimens with calcifications are identified for pathology. At the conclusion of the procedure, a coil shaped tissue marker clip was deployed into the biopsy cavity. Follow-up 2-view mammogram was  performed and dictated separately. IMPRESSION: Stereotactic-guided biopsies of 3 groups of left breast calcifications. No apparent complications. Electronically Signed: By: Claudie Revering M.D. On: 06/24/2022 11:29   MM CLIP PLACEMENT LEFT  Result Date: 06/24/2022 CLINICAL DATA:  Status post 3D stereotactic guided core needle biopsies of 3 groups of left breast calcifications. EXAM: 3D DIAGNOSTIC LEFT MAMMOGRAM POST STEREOTACTIC BIOPSY X 3 COMPARISON:  Previous exam(s). FINDINGS: 3D Mammographic images were obtained following 3D stereotactic guided biopsy of 3 groups of left breast calcifications. The biopsy marking clips are in expected positions at the sites of biopsy. IMPRESSION: 1. Appropriate positioning of the X shaped biopsy marking clip at  the site of biopsy in the slightly lateral left breast. 2. Appropriate positioning of the ribbon shaped biopsy marker clip at the site of biopsy in the lateral left breast. 3. Appropriate positioning of the coil shaped biopsy marker clip at the site of biopsy in the medial left breast. Final Assessment: Post Procedure Mammograms for Marker Placement Electronically Signed   By: Claudie Revering M.D.   On: 06/24/2022 11:31   ASSESSMENT: Atypical hyperplasia  PLAN:    Atypical hyperplasia: Pathology results noted with no malignancy identified.  Atypical hyperplasia increases breast cancer risk 3-5 times over the general population.  We discussed at length the possibility of initiating anastrozole for approximately 50% risk reduction of developing breast cancer lifetime.  Risks and benefits were discussed including possible side effects.  Patient ultimately agreed to initiate anastrozole 1 mg daily for 5 years.  Return to clinic in 3 months for routine evaluation.  If patient is tolerating treatment, she likely can be discharged from clinic with follow-up and refills per primary care. Bone health: Will get a baseline bone mineral density in the next several weeks.  I  spent a total of 60 minutes reviewing chart data, face-to-face evaluation with the patient, counseling and coordination of care as detailed above.   Patient expressed understanding and was in agreement with this plan. She also understands that She can call clinic at any time with any questions, concerns, or complaints.    Cancer Staging  No matching staging information was found for the patient.  Lloyd Huger, MD   07/22/2022 3:32 PM

## 2022-07-26 DIAGNOSIS — R1314 Dysphagia, pharyngoesophageal phase: Secondary | ICD-10-CM | POA: Diagnosis not present

## 2022-07-26 DIAGNOSIS — R1310 Dysphagia, unspecified: Secondary | ICD-10-CM | POA: Diagnosis not present

## 2022-07-26 DIAGNOSIS — K2289 Other specified disease of esophagus: Secondary | ICD-10-CM | POA: Diagnosis not present

## 2022-07-26 DIAGNOSIS — K219 Gastro-esophageal reflux disease without esophagitis: Secondary | ICD-10-CM | POA: Diagnosis not present

## 2022-07-27 ENCOUNTER — Telehealth: Payer: Self-pay | Admitting: *Deleted

## 2022-07-27 NOTE — Telephone Encounter (Signed)
Patient called stating that she had picked up prescription for Letrozole after seen and then got notified that she has a prescription for Anastrozole from pharmacy. She is confused about what she is to take. She has not picked up the Anastrozole yet wanting to know what she is to take. Please advise

## 2022-07-28 ENCOUNTER — Encounter: Payer: Self-pay | Admitting: Oncology

## 2022-07-28 NOTE — Telephone Encounter (Signed)
Call returned to patient and informed that doctor wants her to take the Anastrozole and to please pick up prescription. She agreed and will stop the Letrozole

## 2022-07-30 ENCOUNTER — Ambulatory Visit
Admission: RE | Admit: 2022-07-30 | Discharge: 2022-07-30 | Disposition: A | Discharge status: Home / Self Care | Payer: Medicare Other | Source: Ambulatory Visit | Attending: Gastroenterology | Admitting: Gastroenterology

## 2022-07-30 DIAGNOSIS — R1314 Dysphagia, pharyngoesophageal phase: Secondary | ICD-10-CM | POA: Diagnosis not present

## 2022-07-30 DIAGNOSIS — R6339 Other feeding difficulties: Secondary | ICD-10-CM | POA: Diagnosis not present

## 2022-07-30 DIAGNOSIS — R1310 Dysphagia, unspecified: Secondary | ICD-10-CM | POA: Diagnosis not present

## 2022-07-30 NOTE — Progress Notes (Signed)
Modified Barium Swallow Progress Note  Patient Details  Name: WAFA MARTES MRN: [id number] Date of Birth: July 09, 1951  Today's Date: 07/30/2022  Modified Barium Swallow completed.  Full report located under Chart Review in the Imaging Section.  Brief recommendations include the following:  Clinical Impression  Pt presents with adequate oropharyngeal abilities when consuming thin liquids via cup and straw, puree and soft solids. When consuming whole barium tablet with thin liquids, pt benefited from consuming additional sips of thin liquids to aid in tablet transit thru pharynx. This is likely d/t to pt's chronic xerostomia. Education provided to pt on importance of wetting her mouth and possibly taking her medicine with puree. All education was completed with pt's questions answered to her satisfaction.   Swallow Evaluation Recommendations       SLP Diet Recommendations: Regular solids;Thin liquid   Liquid Administration via: Cup;Straw   Medication Administration: Whole meds with puree   Supervision: Patient able to self feed   Compensations: Minimize environmental distractions;Slow rate;Small sips/bites   Postural Changes: Seated upright at 90 degrees;Remain semi-upright after after feeds/meals (Comment)   Oral Care Recommendations: Oral care BID      Kavon Valenza B. Rutherford Nail, M.S., CCC-SLP, Mining engineer Certified Brain Injury [place]  [place] Office [phone] Ascom [phone] Fax [phone]

## 2022-07-30 NOTE — Addendum Note (Signed)
Encounter addended by: Stormy Fabian, CCC-SLP on: 07/30/2022 1:39 PM  Actions taken: Flowsheet accepted

## 2022-08-19 NOTE — Telephone Encounter (Signed)
This medication or product was previously approved on [id number] from  08/16/2022 to 08/16/2023. **Please note: This request was submitted electronically. Formulary lowering, tiering exception, cost reduction and/or pre-benefit determination review (including prospective Medicare hospice reviews) requests cannot be requested using this method of submission. Providers contact us at [phone] for further assistance.

## 2022-08-30 ENCOUNTER — Encounter: Payer: Self-pay | Admitting: Oncology

## 2022-08-30 ENCOUNTER — Other Ambulatory Visit (HOSPITAL_COMMUNITY): Payer: Self-pay

## 2022-09-08 ENCOUNTER — Encounter: Payer: Self-pay | Admitting: Internal Medicine

## 2022-09-09 ENCOUNTER — Ambulatory Visit: Payer: Medicare Other | Admitting: Nurse Practitioner

## 2022-09-09 ENCOUNTER — Encounter: Payer: Self-pay | Admitting: Family Medicine

## 2022-09-09 ENCOUNTER — Ambulatory Visit (INDEPENDENT_AMBULATORY_CARE_PROVIDER_SITE_OTHER): Payer: Medicare Other | Admitting: Family Medicine

## 2022-09-09 VITALS — BP 120/80 | HR 62 | Temp 97.7°F | Ht 65.0 in | Wt 237.8 lb

## 2022-09-09 DIAGNOSIS — J029 Acute pharyngitis, unspecified: Secondary | ICD-10-CM

## 2022-09-09 DIAGNOSIS — H9202 Otalgia, left ear: Secondary | ICD-10-CM | POA: Diagnosis not present

## 2022-09-09 DIAGNOSIS — R059 Cough, unspecified: Secondary | ICD-10-CM | POA: Diagnosis not present

## 2022-09-09 DIAGNOSIS — R0981 Nasal congestion: Secondary | ICD-10-CM | POA: Diagnosis not present

## 2022-09-09 DIAGNOSIS — G459 Transient cerebral ischemic attack, unspecified: Secondary | ICD-10-CM | POA: Insufficient documentation

## 2022-09-09 DIAGNOSIS — H60502 Unspecified acute noninfective otitis externa, left ear: Secondary | ICD-10-CM

## 2022-09-09 LAB — POCT RAPID STREP A (OFFICE): Rapid Strep A Screen: NEGATIVE

## 2022-09-09 MED ORDER — AZITHROMYCIN 250 MG PO TABS: ORAL_TABLET | ORAL | 0 refills | Status: AC

## 2022-09-09 NOTE — Patient Instructions (Addendum)
It was a pleasure meeting you today. Thank you for allowing me to take part in your health care.  Our goals for today as we discussed include:  Start Z pack for 5 days Take probiotic 1 tablet daily while on antibiotics continue for 2 weeks after completion of antibiotics.  If no better with antibiotic therapy follow-up with PCP  Strep throat test negative  Recommend Shingles vaccine.  This is a 2 dose series and can be given at your local pharmacy.  Please talk to your pharmacist about this.   Recommend pneumonia vaccine  Recommend Tetanus Vaccination.  This is given every 10 years.   If you have any questions or concerns, please do not hesitate to call the office at [phone].  I look forward to our next visit and until then take care and stay safe.  Regards,   Carollee Leitz, MD   [facility]

## 2022-09-09 NOTE — Progress Notes (Signed)
SUBJECTIVE:   Chief Complaint  Patient presents with   Acute Visit    Throat pain/cough X several weeks   HPI Patient presents to clinic for concern of cough and sore throat.  Symptoms ongoing for 6 weeks or longer.  She endorses sore throat with lots of drainage in the back of her throat.  Feels like her lymph nodes are enlarged.  Intermittent cough.  Endorses shortness of breath with coughing.  Reports nasal congestion and runny nose.  Denies any fevers, sick contacts or recent weight loss has used humidifier at night not currently using Flonase was followed by ENT last year for recurrent sinus situs.  She endorses having left ear pain and discharge.  No decrease in hearing, gait disturbances or tinnitus.  She is able to hydrate well.  She reports that usually resolves with course of antibiotics and steroids.  PERTINENT PMH / PSH: Recurrent sinusitis. Essential hypertension PSVT Pharyngitis Dysphagia Stress Persistent cough Nasal congestion Dry eyes Ataxia   OBJECTIVE:  BP 120/80   Pulse 62   Temp 97.7 F (36.5 C) (Oral)   Ht '5\' 5"'$  (1.651 m)   Wt 237 lb 12.8 oz (107.9 kg)   SpO2 97%   BMI 39.57 kg/m    Physical Exam Vitals reviewed.  Constitutional:      General: She is not in acute distress.    Appearance: Normal appearance. She is not ill-appearing, toxic-appearing or diaphoretic.  HENT:     Right Ear: Ear canal and external ear normal. A middle ear effusion is present. Tympanic membrane is scarred.     Left Ear: Ear canal and external ear normal. There is no impacted cerumen. Tympanic membrane is scarred.     Nose: Nose normal. No congestion or rhinorrhea.     Mouth/Throat:     Mouth: Mucous membranes are moist.     Tongue: No lesions.     Pharynx: Oropharynx is clear. Posterior oropharyngeal erythema present. No pharyngeal swelling or oropharyngeal exudate.     Tonsils: No tonsillar exudate.  Eyes:     General:        Right eye: No discharge.        Left  eye: No discharge.     Conjunctiva/sclera: Conjunctivae normal.  Cardiovascular:     Rate and Rhythm: Normal rate and regular rhythm.     Heart sounds: Normal heart sounds.  Pulmonary:     Effort: Pulmonary effort is normal.     Breath sounds: Normal breath sounds.  Musculoskeletal:        General: Normal range of motion.  Lymphadenopathy:     Cervical: No cervical adenopathy.  Skin:    General: Skin is warm and dry.  Neurological:     General: No focal deficit present.     Mental Status: She is alert and oriented to person, place, and time. Mental status is at baseline.  Psychiatric:        Mood and Affect: Mood normal.        Behavior: Behavior normal.        Thought Content: Thought content normal.        Judgment: Judgment normal.     ASSESSMENT/PLAN:  Left ear pain Assessment & Plan: Suspect serous otitis given fluid noted behind ear, discharge from ear and bulging TM. Start Zithromax x 5 days Probiotics daily while on antibiotics and for 2 weeks after completion of antibiotics Patient requesting steroid pack.  Discussed with patient that there is no indication  for use of steroids at this time Follow-up with PCP if symptoms worsen  Orders: -     Azithromycin; Take 2 tablets on day 1, then 1 tablet daily on days 2 through 5  Dispense: 6 tablet; Refill: 0  Sore throat Assessment & Plan: Suspect viral pharyngitis given strep test negative. Continue to hydrate Follow-up with PCP if worsens  Orders: -     POCT rapid strep A  Cough, unspecified type Assessment & Plan: Persistent cough and congestion.  Low suspicion for bacterial etiology given symptoms persisted for 6 weeks.   Treat with Zithromax Start Flonase nasal spray 2 sprays daily Humidified air at night Nasal saline as needed Continue albuterol inhaler Continue Symbicort aerosol Follow-up with PCP if no improvement in symptoms Consider pulmonology consult if cough persists   Nasal  congestion Assessment & Plan: Continue Singulair Restart Flonase 2 sprays daily Follow-up with PCP if no improvement Consider referral to ENT    PDMP reviewed  No follow-ups on file.  Carollee Leitz, MD

## 2022-09-12 ENCOUNTER — Encounter: Payer: Self-pay | Admitting: Family Medicine

## 2022-09-12 DIAGNOSIS — H9202 Otalgia, left ear: Secondary | ICD-10-CM | POA: Insufficient documentation

## 2022-09-12 DIAGNOSIS — H60502 Unspecified acute noninfective otitis externa, left ear: Secondary | ICD-10-CM | POA: Insufficient documentation

## 2022-09-12 NOTE — Assessment & Plan Note (Signed)
Continue Singulair Restart Flonase 2 sprays daily Follow-up with PCP if no improvement Consider referral to ENT

## 2022-09-12 NOTE — Assessment & Plan Note (Signed)
Suspect viral pharyngitis given strep test negative. Continue to hydrate Follow-up with PCP if worsens

## 2022-09-12 NOTE — Assessment & Plan Note (Signed)
Persistent cough and congestion.  Low suspicion for bacterial etiology given symptoms persisted for 6 weeks.   Treat with Zithromax Start Flonase nasal spray 2 sprays daily Humidified air at night Nasal saline as needed Continue albuterol inhaler Continue Symbicort aerosol Follow-up with PCP if no improvement in symptoms Consider pulmonology consult if cough persists

## 2022-09-12 NOTE — Assessment & Plan Note (Signed)
Suspect serous otitis given fluid noted behind ear, discharge from ear and bulging TM. Start Zithromax x 5 days Probiotics daily while on antibiotics and for 2 weeks after completion of antibiotics Patient requesting steroid pack.  Discussed with patient that there is no indication for use of steroids at this time Follow-up with PCP if symptoms worsen

## 2022-09-28 ENCOUNTER — Ambulatory Visit
Admission: RE | Admit: 2022-09-28 | Discharge: 2022-09-28 | Disposition: A | Discharge status: Home / Self Care | Payer: Medicare Other | Source: Ambulatory Visit | Attending: Oncology | Admitting: Oncology

## 2022-09-28 DIAGNOSIS — Z780 Asymptomatic menopausal state: Secondary | ICD-10-CM | POA: Diagnosis not present

## 2022-09-28 DIAGNOSIS — M8588 Other specified disorders of bone density and structure, other site: Secondary | ICD-10-CM | POA: Diagnosis not present

## 2022-10-01 ENCOUNTER — Other Ambulatory Visit (INDEPENDENT_AMBULATORY_CARE_PROVIDER_SITE_OTHER): Payer: Medicare Other

## 2022-10-01 DIAGNOSIS — I10 Essential (primary) hypertension: Secondary | ICD-10-CM | POA: Diagnosis not present

## 2022-10-01 DIAGNOSIS — E7800 Pure hypercholesterolemia, unspecified: Secondary | ICD-10-CM | POA: Diagnosis not present

## 2022-10-01 DIAGNOSIS — D473 Essential (hemorrhagic) thrombocythemia: Secondary | ICD-10-CM | POA: Diagnosis not present

## 2022-10-01 DIAGNOSIS — R739 Hyperglycemia, unspecified: Secondary | ICD-10-CM

## 2022-10-01 LAB — CBC WITH DIFFERENTIAL/PLATELET
Basophils Absolute: 0 10*3/uL (ref 0.0–0.1)
Basophils Relative: 0.6 % (ref 0.0–3.0)
Eosinophils Absolute: 0.2 10*3/uL (ref 0.0–0.7)
Eosinophils Relative: 3 % (ref 0.0–5.0)
HCT: 40.4 % (ref 36.0–46.0)
Hemoglobin: 13.2 g/dL (ref 12.0–15.0)
Lymphocytes Relative: 38.7 % (ref 12.0–46.0)
Lymphs Abs: 3 10*3/uL (ref 0.7–4.0)
MCHC: 32.7 g/dL (ref 30.0–36.0)
MCV: 86.9 fl (ref 78.0–100.0)
Monocytes Absolute: 0.6 10*3/uL (ref 0.1–1.0)
Monocytes Relative: 7.7 % (ref 3.0–12.0)
Neutro Abs: 3.9 10*3/uL (ref 1.4–7.7)
Neutrophils Relative %: 50 % (ref 43.0–77.0)
Platelets: 444 10*3/uL — ABNORMAL HIGH (ref 150.0–400.0)
RBC: 4.65 Mil/uL (ref 3.87–5.11)
RDW: 14.3 % (ref 11.5–15.5)
WBC: 7.8 10*3/uL (ref 4.0–10.5)

## 2022-10-01 LAB — BASIC METABOLIC PANEL
BUN: 14 mg/dL (ref 6–23)
CO2: 29 mEq/L (ref 19–32)
Calcium: 9.7 mg/dL (ref 8.4–10.5)
Chloride: 105 mEq/L (ref 96–112)
Creatinine, Ser: 1.13 mg/dL (ref 0.40–1.20)
GFR: 48.63 mL/min — ABNORMAL LOW (ref >60.00)
Glucose, Bld: 96 mg/dL (ref 70–99)
Potassium: 4.4 mEq/L (ref 3.5–5.1)
Sodium: 142 mEq/L (ref 135–145)

## 2022-10-01 LAB — HEPATIC FUNCTION PANEL
ALT: 13 U/L (ref 0–35)
AST: 15 U/L (ref 0–37)
Albumin: 4.1 g/dL (ref 3.5–5.2)
Alkaline Phosphatase: 97 U/L (ref 39–117)
Bilirubin, Direct: 0.1 mg/dL (ref 0.0–0.3)
Total Bilirubin: 0.4 mg/dL (ref 0.2–1.2)
Total Protein: 7.5 g/dL (ref 6.0–8.3)

## 2022-10-01 LAB — HEMOGLOBIN A1C: Hgb A1c MFr Bld: 6 % (ref 4.6–6.5)

## 2022-10-01 LAB — LIPID PANEL
Cholesterol: 183 mg/dL (ref 0–200)
HDL: 61.4 mg/dL (ref >39.00)
LDL Cholesterol: 96 mg/dL (ref 0–99)
NonHDL: 121.17
Total CHOL/HDL Ratio: 3
Triglycerides: 128 mg/dL (ref 0.0–149.0)
VLDL: 25.6 mg/dL (ref 0.0–40.0)

## 2022-10-05 ENCOUNTER — Encounter: Payer: Self-pay | Admitting: Internal Medicine

## 2022-10-05 ENCOUNTER — Ambulatory Visit (INDEPENDENT_AMBULATORY_CARE_PROVIDER_SITE_OTHER): Payer: Medicare Other | Admitting: Internal Medicine

## 2022-10-05 VITALS — BP 128/74 | HR 72 | Temp 98.3°F | Resp 16 | Ht 65.0 in | Wt 235.0 lb

## 2022-10-05 DIAGNOSIS — R739 Hyperglycemia, unspecified: Secondary | ICD-10-CM

## 2022-10-05 DIAGNOSIS — L900 Lichen sclerosus et atrophicus: Secondary | ICD-10-CM

## 2022-10-05 DIAGNOSIS — E7800 Pure hypercholesterolemia, unspecified: Secondary | ICD-10-CM

## 2022-10-05 DIAGNOSIS — K219 Gastro-esophageal reflux disease without esophagitis: Secondary | ICD-10-CM | POA: Diagnosis not present

## 2022-10-05 DIAGNOSIS — G720 Drug-induced myopathy: Secondary | ICD-10-CM

## 2022-10-05 DIAGNOSIS — I10 Essential (primary) hypertension: Secondary | ICD-10-CM | POA: Diagnosis not present

## 2022-10-05 DIAGNOSIS — I5030 Unspecified diastolic (congestive) heart failure: Secondary | ICD-10-CM | POA: Diagnosis not present

## 2022-10-05 DIAGNOSIS — N6099 Unspecified benign mammary dysplasia of unspecified breast: Secondary | ICD-10-CM | POA: Diagnosis not present

## 2022-10-05 DIAGNOSIS — I471 Supraventricular tachycardia, unspecified: Secondary | ICD-10-CM | POA: Diagnosis not present

## 2022-10-05 DIAGNOSIS — R928 Other abnormal and inconclusive findings on diagnostic imaging of breast: Secondary | ICD-10-CM

## 2022-10-05 DIAGNOSIS — I5033 Acute on chronic diastolic (congestive) heart failure: Secondary | ICD-10-CM

## 2022-10-05 DIAGNOSIS — T466X5A Adverse effect of antihyperlipidemic and antiarteriosclerotic drugs, initial encounter: Secondary | ICD-10-CM

## 2022-10-05 DIAGNOSIS — D473 Essential (hemorrhagic) thrombocythemia: Secondary | ICD-10-CM | POA: Diagnosis not present

## 2022-10-05 DIAGNOSIS — H9210 Otorrhea, unspecified ear: Secondary | ICD-10-CM

## 2022-10-05 DIAGNOSIS — I700 Atherosclerosis of aorta: Secondary | ICD-10-CM | POA: Diagnosis not present

## 2022-10-05 MED ORDER — VENLAFAXINE HCL ER 150 MG PO CP24: 150.0000 mg | ORAL_CAPSULE | Freq: Every day | ORAL | 3 refills | Status: DC

## 2022-10-05 NOTE — Progress Notes (Signed)
Subjective:    Patient ID: Nicole Sims, female    DOB: 11-16-1950, 72 y.o.   MRN: [id number]  Patient here for  Chief Complaint  Patient presents with   Medical Management of Chronic Issues    HPI Here for follow up.  Doing relatively well. Saw Dr Peyton Najjar - evaluation of left breast atypical lobular hyperplasia and pseudoangiomatous stromal hyperplasia. Recommended observation and follow-up with bilateral diagnostic mammogram in 6 months and breast physical exam.  Also referred to oncology.  Saw Dr Grayland Ormond 07/22/22 - recommended anastrozole for 5 years.  Recommended bone density. Bone density revealed osteopenia.  Saw GI - swallowing eval - Hypotensive upper esophageal sphincter. The esophageal manometry was otherwise normal. Normal liquid esophageal transit.  Seen 09/09/22 - ear pain and cough/sore throat.  Treated with zpak and flonase. Reports feeling better.  Ear still draining.  No pain reported.  Breathing stable.    Past Medical History:  Diagnosis Date   Anemia    Anginal pain (HCC)    Anxiety    Asthma    Cataract cortical, senile    CHF (congestive heart failure) (HCC)    Chronic headaches    Diastolic heart failure (HCC)    Diverticulosis    Environmental allergies    GERD (gastroesophageal reflux disease)    Heart murmur    Hyperglycemia    Hyperlipidemia    Hypertension    Leukocytosis    Obesity    Osteoarthritis    Palpitations    Restless leg syndrome    Sleep apnea    Thrombocytosis    Urinary incontinence    mixed   Venous insufficiency    Vitamin D deficiency    Wears dentures    partial upper   Past Surgical History:  Procedure Laterality Date   BLADDER SURGERY     x2   [place] [facility] [place]   BREAST BIOPSY Left 06/24/2022   Stereo Bx, X-clip, path pending   BREAST BIOPSY Left 06/24/2022   Stereo Bx, ribbon clip, path pending   BREAST BIOPSY  06/24/2022   Stereo Bx, Coil Clip, Path Pending   BREAST BIOPSY Left 06/24/2022   MM LT BREAST BX  W LOC DEV 1ST LESION IMAGE BX SPEC STEREO GUIDE 06/24/2022 [facility]   BREAST BIOPSY Left 06/24/2022   MM LT BREAST BX W LOC DEV EA AD LESION IMG BX SPEC STEREO GUIDE 06/24/2022 [facility]   BREAST BIOPSY Left 06/24/2022   MM LT BREAST BX W LOC DEV EA AD LESION IMG BX SPEC STEREO GUIDE 06/24/2022 [facility]   BREAST CYST ASPIRATION Bilateral 2005   approximate year   CARDIAC CATHETERIZATION     Nehemiah Massed   CATARACT EXTRACTION W/PHACO Right 04/24/2020   Procedure: CATARACT EXTRACTION PHACO AND INTRAOCULAR LENS PLACEMENT ([place]) RIGHT;  Surgeon: Birder Robson, MD;  Location: [place];  Service: Ophthalmology;  Laterality: Right;  6.75 0:37.3   CATARACT EXTRACTION W/PHACO Left 01/27/2021   Procedure: CATARACT EXTRACTION PHACO AND INTRAOCULAR LENS PLACEMENT ([place]) LEFT;  Surgeon: Birder Robson, MD;  Location: [place];  Service: Ophthalmology;  Laterality: Left;  6.75 00:43.9   CERVICAL CONE BIOPSY     CIS   CHOLECYSTECTOMY     COLONOSCOPY WITH PROPOFOL N/A 07/19/2016   Procedure: COLONOSCOPY WITH PROPOFOL;  Surgeon: Manya Silvas, MD;  Location[facility] END[facility]COPY;  Service: Endoscopy;  Laterality: N/A;   COLONOSCOPY WITH PROPOFOL N/A 10/20/2018   Procedure: COLONOSCOPY WITH PROPOFOL;  Surgeon: Lollie Sails, MD;  Location: [facility] ENDOSCOPY;  Service: Endoscopy;  Laterality: N/A;   ESOPHAGOGASTRODUODENOSCOPY N/A 05/12/2021   Procedure: ESOPHAGOGASTRODUODENOSCOPY (EGD);  Surgeon: Lesly Rubenstein, MD;  Location: [facility] ENDOSCOPY;  Service: Endoscopy;  Laterality: N/A;   ESOPHAGOGASTRODUODENOSCOPY (EGD) WITH PROPOFOL N/A 07/19/2016   Procedure: ESOPHAGOGASTRODUODENOSCOPY (EGD) WITH PROPOFOL;  Surgeon: Manya Silvas, MD;  Location: [facility] ENDOSCOPY;  Service: Endoscopy;  Laterality: N/A;   FLEXIBLE SIGMOIDOSCOPY     KNEE ARTHROSCOPY  08/13/2008   knee replacement and revision     left   RECTOCELE REPAIR     RIGHT/LEFT HEART CATH AND CORONARY  ANGIOGRAPHY Bilateral 04/10/2018   Procedure: RIGHT/LEFT HEART CATH AND CORONARY ANGIOGRAPHY;  Surgeon: Wellington Hampshire, MD;  Location: [place] [facility];  Service: Cardiovascular;  Laterality: Bilateral;   ROTATOR CUFF REPAIR     bilateral   SHOULDER SURGERY  11/17/2005   TONSILLECTOMY  1962   VAGINAL HYSTERECTOMY  1974   abnormal pap and carcinoma in situ   Family History  Problem Relation Age of Onset   Diabetes Mellitus II Father    Thyroid disease Father    Alzheimer's disease Father    Breast cancer Maternal Aunt    Hypertension Mother    Heart Problems Brother    Leukemia Maternal Aunt    Alzheimer's disease Paternal Aunt    Alzheimer's disease Paternal Uncle    Alzheimer's disease Paternal Uncle    Alzheimer's disease Paternal Uncle    Alzheimer's disease Paternal Aunt    Colon cancer Neg Hx    Kidney cancer Neg Hx    Bladder Cancer Neg Hx    Social History   Socioeconomic History   Marital status: Married    Spouse name: Not on file   Number of children: Not on file   Years of education: Not on file   Highest education level: Not on file  Occupational History   Occupation: retired  Tobacco Use   Smoking status: Former    Packs/day: 1.00    Years: 15.00    Total pack years: 15.00    Types: Cigarettes    Quit date: 08/17/1983    Years since quitting: 39.1   Smokeless tobacco: Never  Vaping Use   Vaping Use: Never used  Substance and Sexual Activity   Alcohol use: Yes    Alcohol/week: 0.0 standard drinks of alcohol    Comment: rarely   Drug use: No   Sexual activity: Not on file  Other Topics Concern   Not on file  Social History Narrative   Lives at home with husband   Social Determinants of Health   Financial Resource Strain: Low Risk  (01/21/2022)   Overall Financial Resource Strain (CARDIA)    Difficulty of Paying Living Expenses: Not hard at all  Food Insecurity: No Food Insecurity (01/21/2022)   Hunger Vital Sign    Worried About Running  Out of Food in the Last Year: Never true    Ran Out of Food in the Last Year: Never true  Transportation Needs: No Transportation Needs (01/21/2022)   PRAPARE - Hydrologist (Medical): No    Lack of Transportation (Non-Medical): No  Physical Activity: Inactive (01/21/2022)   Exercise Vital Sign    Days of Exercise per Week: 0 days    Minutes of Exercise per Session: 0 min  Stress: No Stress Concern Present (01/21/2022)   Albers    Feeling of Stress :  Only a little  Social Connections: Unknown (01/21/2022)   Social Connection and Isolation Panel [NHANES]    Frequency of Communication with Friends and Family: Twice a week    Frequency of Social Gatherings with Friends and Family: Twice a week    Attends Religious Services: Not on Advertising copywriter or Organizations: Not on file    Attends Archivist Meetings: Not on file    Marital Status: Married     Review of Systems  Constitutional:  Negative for appetite change and unexpected weight change.  HENT:  Negative for congestion and sinus pressure.        Ear draining as outlined.    Respiratory:  Negative for cough, chest tightness and shortness of breath.   Cardiovascular:  Negative for chest pain and palpitations.  Gastrointestinal:  Negative for abdominal pain, diarrhea and vomiting.  Genitourinary:  Negative for difficulty urinating and dysuria.  Musculoskeletal:  Negative for joint swelling and myalgias.  Skin:  Negative for color change and rash.  Neurological:  Negative for dizziness and headaches.  Psychiatric/Behavioral:  Negative for agitation and dysphoric mood.        Objective:     BP 128/74   Pulse 72   Temp 98.3 F (36.8 C)   Resp 16   Ht '5\' 5"'$  (1.651 m)   Wt 235 lb (106.6 kg)   SpO2 97%   BMI 39.11 kg/m  Wt Readings from Last 3 Encounters:  10/05/22 235 lb (106.6 kg)  09/09/22 237 lb 12.8 oz  (107.9 kg)  07/22/22 234 lb (106.1 kg)    Physical Exam Vitals reviewed.  Constitutional:      General: She is not in acute distress.    Appearance: Normal appearance.  HENT:     Head: Normocephalic and atraumatic.     Right Ear: Tympanic membrane, ear canal and external ear normal.     Left Ear: Tympanic membrane, ear canal and external ear normal.  Eyes:     General: No scleral icterus.       Right eye: No discharge.        Left eye: No discharge.     Conjunctiva/sclera: Conjunctivae normal.  Neck:     Thyroid: No thyromegaly.  Cardiovascular:     Rate and Rhythm: Normal rate and regular rhythm.  Pulmonary:     Effort: No respiratory distress.     Breath sounds: Normal breath sounds. No wheezing.  Abdominal:     General: Bowel sounds are normal.     Palpations: Abdomen is soft.     Tenderness: There is no abdominal tenderness.  Musculoskeletal:        General: No swelling or tenderness.     Cervical back: Neck supple. No tenderness.  Lymphadenopathy:     Cervical: No cervical adenopathy.  Skin:    Findings: No erythema or rash.  Neurological:     Mental Status: She is alert.  Psychiatric:        Mood and Affect: Mood normal.        Behavior: Behavior normal.      Outpatient Encounter Medications as of 10/05/2022  Medication Sig   acetaminophen (TYLENOL) 500 MG tablet Take 500 mg by mouth every 6 (six) hours as needed.   albuterol (VENTOLIN HFA) 108 (90 Base) MCG/ACT inhaler Inhale 2 puffs into the lungs every 4 (four) hours as needed for wheezing or shortness of breath.   anastrozole (ARIMIDEX) 1 MG tablet Take  1 tablet (1 mg total) by mouth daily.   aspirin 325 MG tablet Take 325 mg by mouth daily.   budesonide-formoterol (SYMBICORT) 80-4.5 MCG/ACT inhaler Inhale 2 puffs into the lungs 2 (two) times daily.   furosemide (LASIX) 20 MG tablet Take 1 tablet (20 mg) by mouth once daily as needed for weight gain of 3 lbs or more overnight   linaclotide (LINZESS) 290  MCG CAPS capsule Take 290 mcg by mouth daily before breakfast.   metoprolol succinate (TOPROL-XL) 100 MG 24 hr tablet Take 1 tablet (100 mg total) by mouth daily.   Omega-3 Fatty Acids (FISH OIL) 1000 MG CAPS Take 1,000 mg by mouth.   ondansetron (ZOFRAN) 4 MG tablet Take 1 tablet (4 mg total) by mouth 2 (two) times daily as needed for nausea or vomiting.   potassium chloride (KLOR-CON) 10 MEQ tablet Take 1 tablet (10 meq) by mouth once daily as needed only when taking lasix (furosemide)   pregabalin (LYRICA) 100 MG capsule Take 100 mg by mouth at bedtime.   pregabalin (LYRICA) 50 MG capsule Take by mouth. Take 1 capsule by mouth in the morning and 1 capsules at afternoon   RABEprazole (ACIPHEX) 20 MG tablet Take 20 mg by mouth 2 (two) times daily.   sucralfate (CARAFATE) 1 g tablet Take 1 g by mouth daily as needed (GI sympotms).    venlafaxine XR (EFFEXOR-XR) 150 MG 24 hr capsule Take 1 capsule (150 mg total) by mouth daily with breakfast.   [DISCONTINUED] nitrofurantoin, macrocrystal-monohydrate, (MACROBID) 100 MG capsule Take 1 capsule (100 mg total) by mouth 2 (two) times daily.   [DISCONTINUED] REPATHA 140 MG/ML SOSY INJECT 140 MG UNDER THE SKIN EVERY 14 DAYS   [DISCONTINUED] venlafaxine XR (EFFEXOR-XR) 150 MG 24 hr capsule TAKE 1 CAPSULE BY MOUTH  DAILY WITH BREAKFAST   No facility-administered encounter medications on file as of 10/05/2022.     Lab Results  Component Value Date   WBC 7.8 10/01/2022   HGB 13.2 10/01/2022   HCT 40.4 10/01/2022   PLT 444.0 (H) 10/01/2022   GLUCOSE 96 10/01/2022   CHOL 183 10/01/2022   TRIG 128.0 10/01/2022   HDL 61.40 10/01/2022   LDLDIRECT 172.8 09/17/2013   LDLCALC 96 10/01/2022   ALT 13 10/01/2022   AST 15 10/01/2022   NA 142 10/01/2022   K 4.4 10/01/2022   CL 105 10/01/2022   CREATININE 1.13 10/01/2022   BUN 14 10/01/2022   CO2 29 10/01/2022   TSH 2.56 02/25/2022   INR 1.0 06/11/2019   HGBA1C 6.0 10/01/2022   MICROALBUR 1.1 04/28/2015     DG Bone Density  Result Date: 09/28/2022 EXAM: DUAL X-RAY ABSORPTIOMETRY (DXA) FOR BONE MINERAL DENSITY IMPRESSION: Your patient Valyncia Zirpoli completed a BMD test on 09/28/2022 using the [employer] (software version: 14.10) manufactured by [employer]. The following summarizes the results of our evaluation. Technologist:VLM PATIENT BIOGRAPHICAL: Name: Tramia, Rebollar Patient ID: [id number] Birth Date: 1950/10/03 Height: 65.0 in. Gender: Female Exam Date: 09/28/2022 Weight: 237.8 lbs. Indications: Hysterectomy, Caucasian, Postmenopausal Fractures: Treatments: Anastrozole, Vitamin D DENSITOMETRY RESULTS: Site          Region     Measured Date Measured Age WHO Classification Young Adult T-score BMD         %Change vs. Previous Significant Change (*) DualFemur Neck Left 09/28/2022 72.4 Osteopenia -1.7 0.804 g/cm2 -4.1% - DualFemur Neck Left 07/22/2016 66.2 Osteopenia -1.4 0.838 g/cm2 - - DualFemur Total Mean 09/28/2022 72.4  Normal -0.9 0.889 g/cm2 -1.3% - DualFemur Total Mean 07/22/2016 66.2 Normal -0.9 0.901 g/cm2 - - Right Forearm Radius 33% 09/28/2022 72.4 Osteopenia -1.8 0.718 g/cm2 - - ASSESSMENT: The BMD measured at Forearm Radius 33% is 0.718 g/cm2 with a T-score of -1.8. This patient is considered osteopenic according to [facility]) criteria. Lumbar spine was not utilized due to advanced degenerative changes. Compared with prior study, there has been no significant change in the total hip. The scan quality is good. World Pharmacologist [facility]) criteria for post-menopausal, Caucasian Women: Normal:                   T-score at or above -1 SD Osteopenia/low bone mass: T-score between -1 and -2.5 SD Osteoporosis:             T-score at or below -2.5 SD RECOMMENDATIONS: 1. All patients should optimize calcium and vitamin D intake. 2. Consider FDA-approved medical therapies in postmenopausal women and men aged 2 years and older, based on the following: a. A hip  or vertebral(clinical or morphometric) fracture b. T-score < -2.5 at the femoral neck or spine after appropriate evaluation to exclude secondary causes c. Low bone mass (T-score between -1.0 and -2.5 at the femoral neck or spine) and a 10-year probability of a hip fracture > 3% or a 10-year probability of a major osteoporosis-related fracture > 20% based on the [place]-adapted WHO algorithm 3. Clinician judgment and/or patient preferences may indicate treatment for people with 10-year fracture probabilities above or below these levels FOLLOW-UP: People with diagnosed cases of osteoporosis or at high risk for fracture should have regular bone mineral density tests. For patients eligible for Medicare, routine testing is allowed once every 2 years. The testing frequency can be increased to one year for patients who have rapidly progressing disease, those who are receiving or discontinuing medical therapy to restore bone mass, or have additional risk factors. I have reviewed this report, and agree with the above findings. [facility] Dear Lloyd Huger, Your patient JULANNE BERTOLET completed a FRAX assessment on 09/28/2022 using the [place] (analysis version: 14.10) manufactured by [employer]. The following summarizes the results of our evaluation. PATIENT BIOGRAPHICAL: Name: Holdyn, Das Patient ID: [id number] Birth Date: 1950-10-31 Height:    65.0 in. Gender:     [age]    [age]       Weight:    237.8 lbs. Ethnicity:  White                            Exam Date: 09/28/2022 FRAX* RESULTS:  (version: 3.5) 10-year Probability of Fracture1 Major Osteoporotic Fracture2 Hip Fracture 9.9% 1.7% Population: [place] (Caucasian) Risk Factors: None Based on Femur (Left) Neck BMD 1 -The 10-year probability of fracture may be lower than reported if the patient has received treatment. 2 -Major Osteoporotic Fracture: Clinical Spine, Forearm, Hip or Shoulder *FRAX is a Materials engineer of the [employer]  of [employer] for Metabolic Bone Disease, a [place] (WHO) [facility]. ASSESSMENT: The probability of a major osteoporotic fracture is 9.9% within the next ten years. The probability of a hip fracture is 1.7% within the next ten years. . Electronically Signed   By: Zerita Boers M.D.   On: 09/28/2022 14:47       Assessment & Plan:  Essential hypertension Assessment & Plan:  Continue toprol.  Blood pressure doing well.  Follow blood pressure.  Follow metabolic panel.   Orders: -     Basic metabolic panel; Future  Hyperglycemia -     Hemoglobin A1c; Future  Hypercholesteremia Assessment & Plan: On repatha and doing well.  Follow.    Orders: -     Lipid panel; Future -     Hepatic function panel; Future  Abnormal mammogram Assessment & Plan: Saw Dr Peyton Najjar - evaluation of left breast atypical lobular hyperplasia and pseudoangiomatous stromal hyperplasia. Recommended observation and follow-up with bilateral diagnostic mammogram in 6 months and breast physical exam.  Also referred to oncology.  Saw Dr Grayland Ormond 07/22/22 - recommended anastrozole for 5 years.    Aortic atherosclerosis ([place]) Assessment & Plan: On repatha.  Continue.    Atypical hyperplasia of breast Assessment & Plan: Saw Dr Grayland Ormond 07/22/22 - anastrozole 1 mg daily for 5 years    Diastolic heart failure, unspecified HF chronicity ([place]) Assessment & Plan: History of diastolic dysfunction.  EF 60-65%.  No increased sob reported.  Follow. Continue metoprolol.    CHF NYHA class III (symptoms with mildly strenuous activities), acute on chronic, diastolic (HCC) Assessment & Plan: Repeat echo 11/07/21 showed LVEF 65-70%, no WMA, normal RV function. Continue Lasix as needed   Essential (hemorrhagic) thrombocythemia ([place]) Assessment & Plan: Worked up previously by hematology.  Follow cbc.    Gastroesophageal reflux disease without esophagitis Assessment &  Plan: Continue aciphex.     Lichen sclerosus Assessment & Plan: Saw gyn 05/2021.  PAP negative.  Continue f/u with gyn.    PSVT (paroxysmal supraventricular tachycardia) Assessment & Plan: Has a history of PSVT.  Has seen Dr Fletcher Anon.  On metoprolol.  No increased heart rate or palpitations reported.  Continue metoprolol.     Statin myopathy Assessment & Plan: Previous joint aching.  On repatha now.  Off statin.  Follow.    Otorrhea, unspecified laterality Assessment & Plan: Recently treated for ear infection.  No pain.  Doing better. Some persistent drainage reported.  No abnormality noted on exam.  If persistent, will need ENT evaluation.     Other orders -     Venlafaxine HCl ER; Take 1 capsule (150 mg total) by mouth daily with breakfast.  Dispense: 90 capsule; Refill: 3     Einar Pheasant, MD

## 2022-10-11 ENCOUNTER — Other Ambulatory Visit: Payer: Self-pay | Admitting: Medical

## 2022-10-11 DIAGNOSIS — I700 Atherosclerosis of aorta: Secondary | ICD-10-CM

## 2022-10-11 DIAGNOSIS — E782 Mixed hyperlipidemia: Secondary | ICD-10-CM

## 2022-10-12 ENCOUNTER — Encounter: Payer: Self-pay | Admitting: Internal Medicine

## 2022-10-12 DIAGNOSIS — Z86010 Personal history of colonic polyps: Secondary | ICD-10-CM | POA: Diagnosis not present

## 2022-10-12 DIAGNOSIS — K625 Hemorrhage of anus and rectum: Secondary | ICD-10-CM | POA: Diagnosis not present

## 2022-10-12 DIAGNOSIS — T466X5A Adverse effect of antihyperlipidemic and antiarteriosclerotic drugs, initial encounter: Secondary | ICD-10-CM | POA: Insufficient documentation

## 2022-10-12 DIAGNOSIS — H9210 Otorrhea, unspecified ear: Secondary | ICD-10-CM | POA: Insufficient documentation

## 2022-10-12 NOTE — Assessment & Plan Note (Signed)
On repatha and doing well.  Follow.

## 2022-10-12 NOTE — Assessment & Plan Note (Signed)
Continue toprol.  Blood pressure doing well.  Follow blood pressure.  Follow metabolic panel.

## 2022-10-12 NOTE — Assessment & Plan Note (Signed)
Previous joint aching.  On repatha now.  Off statin.  Follow.

## 2022-10-12 NOTE — Assessment & Plan Note (Signed)
Saw gyn 05/2021.  PAP negative.  Continue f/u with gyn.

## 2022-10-12 NOTE — Assessment & Plan Note (Signed)
Worked up previously by hematology.  Follow cbc.

## 2022-10-12 NOTE — Assessment & Plan Note (Signed)
Continue aciphex.  

## 2022-10-12 NOTE — Assessment & Plan Note (Signed)
Has a history of PSVT.  Has seen Dr Fletcher Anon.  On metoprolol.  No increased heart rate or palpitations reported.  Continue metoprolol.

## 2022-10-12 NOTE — Assessment & Plan Note (Signed)
Saw Dr Grayland Ormond 07/22/22 - anastrozole 1 mg daily for 5 years

## 2022-10-12 NOTE — Assessment & Plan Note (Signed)
Recently treated for ear infection.  No pain.  Doing better. Some persistent drainage reported.  No abnormality noted on exam.  If persistent, will need ENT evaluation.

## 2022-10-12 NOTE — Assessment & Plan Note (Signed)
Saw Dr Peyton Najjar - evaluation of left breast atypical lobular hyperplasia and pseudoangiomatous stromal hyperplasia. Recommended observation and follow-up with bilateral diagnostic mammogram in 6 months and breast physical exam.  Also referred to oncology.  Saw Dr Grayland Ormond 07/22/22 - recommended anastrozole for 5 years.

## 2022-10-12 NOTE — Assessment & Plan Note (Signed)
Repeat echo 11/07/21 showed LVEF 65-70%, no WMA, normal RV function. Continue Lasix as needed

## 2022-10-12 NOTE — Assessment & Plan Note (Signed)
History of diastolic dysfunction.  EF 60-65%.  No increased sob reported.  Follow. Continue metoprolol.

## 2022-10-12 NOTE — Assessment & Plan Note (Signed)
On repatha.  Continue.

## 2022-10-18 ENCOUNTER — Other Ambulatory Visit: Payer: Self-pay | Admitting: General Surgery

## 2022-10-18 DIAGNOSIS — N6091 Unspecified benign mammary dysplasia of right breast: Secondary | ICD-10-CM

## 2022-10-28 ENCOUNTER — Inpatient Hospital Stay: Payer: Medicare Other | Attending: Oncology | Admitting: Oncology

## 2022-10-28 ENCOUNTER — Encounter: Payer: Self-pay | Admitting: Oncology

## 2022-10-28 VITALS — BP 138/63 | HR 63 | Temp 96.3°F | Resp 18 | Ht 65.0 in | Wt 240.0 lb

## 2022-10-28 DIAGNOSIS — Z885 Allergy status to narcotic agent status: Secondary | ICD-10-CM | POA: Insufficient documentation

## 2022-10-28 DIAGNOSIS — N6099 Unspecified benign mammary dysplasia of unspecified breast: Secondary | ICD-10-CM | POA: Diagnosis not present

## 2022-10-28 DIAGNOSIS — Z881 Allergy status to other antibiotic agents status: Secondary | ICD-10-CM | POA: Diagnosis not present

## 2022-10-28 DIAGNOSIS — Z8349 Family history of other endocrine, nutritional and metabolic diseases: Secondary | ICD-10-CM | POA: Insufficient documentation

## 2022-10-28 DIAGNOSIS — Z90710 Acquired absence of both cervix and uterus: Secondary | ICD-10-CM | POA: Diagnosis not present

## 2022-10-28 DIAGNOSIS — M8580 Other specified disorders of bone density and structure, unspecified site: Secondary | ICD-10-CM | POA: Diagnosis not present

## 2022-10-28 DIAGNOSIS — Z8249 Family history of ischemic heart disease and other diseases of the circulatory system: Secondary | ICD-10-CM | POA: Diagnosis not present

## 2022-10-28 DIAGNOSIS — Z832 Family history of diseases of the blood and blood-forming organs and certain disorders involving the immune mechanism: Secondary | ICD-10-CM | POA: Diagnosis not present

## 2022-10-28 DIAGNOSIS — Z880 Allergy status to penicillin: Secondary | ICD-10-CM | POA: Insufficient documentation

## 2022-10-28 DIAGNOSIS — Z79811 Long term (current) use of aromatase inhibitors: Secondary | ICD-10-CM | POA: Diagnosis not present

## 2022-10-28 DIAGNOSIS — I110 Hypertensive heart disease with heart failure: Secondary | ICD-10-CM | POA: Insufficient documentation

## 2022-10-28 DIAGNOSIS — I5032 Chronic diastolic (congestive) heart failure: Secondary | ICD-10-CM | POA: Insufficient documentation

## 2022-10-28 DIAGNOSIS — Z803 Family history of malignant neoplasm of breast: Secondary | ICD-10-CM | POA: Insufficient documentation

## 2022-10-28 DIAGNOSIS — Z9049 Acquired absence of other specified parts of digestive tract: Secondary | ICD-10-CM | POA: Diagnosis not present

## 2022-10-28 DIAGNOSIS — Z79899 Other long term (current) drug therapy: Secondary | ICD-10-CM | POA: Insufficient documentation

## 2022-10-28 DIAGNOSIS — Z833 Family history of diabetes mellitus: Secondary | ICD-10-CM | POA: Diagnosis not present

## 2022-10-28 DIAGNOSIS — Z818 Family history of other mental and behavioral disorders: Secondary | ICD-10-CM | POA: Diagnosis not present

## 2022-10-28 NOTE — Progress Notes (Signed)
[name]  [phone] [phone]  ID: Nicole Sims OB: 1950/12/28  MR#: [id number]  [id number]  Patient Care Team: Einar Pheasant, MD as PCP - General (Internal Medicine) Wellington Hampshire, MD as PCP - Cardiology (Cardiology)  CHIEF COMPLAINT: Atypical hyperplasia  INTERVAL HISTORY: Patient returns to clinic today for routine 44-monthevaluation and to assess her toleration of anastrozole.  Patient states she has noted an approximately 5 pound weight gain with some abdominal bloating, but otherwise feels well.  She has no neurologic complaints.  She denies any recent fevers or illnesses.  She has a good appetite.  She has no chest pain, shortness of breath, cough, or hemoptysis.  She denies any nausea, vomiting, constipation, or diarrhea.  She has no urinary complaints.  Patient offers no complaints today.  REVIEW OF SYSTEMS:   Review of Systems  Constitutional: Negative.  Negative for fever, malaise/fatigue and weight loss.  Respiratory: Negative.  Negative for hemoptysis and shortness of breath.   Cardiovascular: Negative.  Negative for chest pain and leg swelling.  Gastrointestinal: Negative.  Negative for abdominal pain.  Genitourinary: Negative.  Negative for dysuria.  Musculoskeletal: Negative.  Negative for back pain.  Skin: Negative.  Negative for rash.  Neurological: Negative.  Negative for dizziness, focal weakness, weakness and headaches.  Psychiatric/Behavioral: Negative.  The patient is not nervous/anxious.     As per HPI. Otherwise, a complete review of systems is negative.  PAST MEDICAL HISTORY: Past Medical History:  Diagnosis Date   Anemia    Anginal pain (HCC)    Anxiety    Asthma    Cataract cortical, senile    CHF (congestive heart failure) (HCC)    Chronic headaches    Diastolic heart failure (HCC)    Diverticulosis    Environmental allergies    GERD (gastroesophageal reflux disease)    Heart murmur     Hyperglycemia    Hyperlipidemia    Hypertension    Leukocytosis    Obesity    Osteoarthritis    Palpitations    Restless leg syndrome    Sleep apnea    Thrombocytosis    Urinary incontinence    mixed   Venous insufficiency    Vitamin D deficiency    Wears dentures    partial upper    PAST SURGICAL HISTORY: Past Surgical History:  Procedure Laterality Date   BLADDER SURGERY     x2   [facility]   BREAST BIOPSY Left 06/24/2022   Stereo Bx, X-clip, path pending   BREAST BIOPSY Left 06/24/2022   Stereo Bx, ribbon clip, path pending   BREAST BIOPSY  06/24/2022   Stereo Bx, Coil Clip, Path Pending   BREAST BIOPSY Left 06/24/2022   MM LT BREAST BX W LOC DEV 1ST LESION IMAGE BX SPEC STEREO GUIDE 06/24/2022 [facility]   BREAST BIOPSY Left 06/24/2022   MM LT BREAST BX W LOC DEV EA AD LESION IMG BX SPEC STEREO GUIDE 06/24/2022 [facility]   BREAST BIOPSY Left 06/24/2022   MM LT BREAST BX W LOC DEV EA AD LESION IMG BX SPEC STEREO GUIDE 06/24/2022 [facility]   BREAST CYST ASPIRATION Bilateral 2005   approximate year   CARDIAC CATHETERIZATION     KNehemiah Massed  CATARACT EXTRACTION W/PHACO Right 04/24/2020   Procedure: CATARACT EXTRACTION PHACO AND INTRAOCULAR LENS PLACEMENT ([place] RIGHT;  Surgeon: PBirder Robson MD;  Location[place]  Service: Ophthalmology;  Laterality: Right;  6.75 0:37.3   CATARACT EXTRACTION  W/PHACO Left 01/27/2021   Procedure: CATARACT EXTRACTION PHACO AND INTRAOCULAR LENS PLACEMENT (IOC) LEFT;  Surgeon: Birder Robson, MD;  Location: [place];  Service: Ophthalmology;  Laterality: Left;  6.75 00:43.9   CERVICAL CONE BIOPSY     CIS   CHOLECYSTECTOMY     COLONOSCOPY WITH PROPOFOL N/A 07/19/2016   Procedure: COLONOSCOPY WITH PROPOFOL;  Surgeon: Manya Silvas, MD;  Location: [facility] ENDOSCOPY;  Service: Endoscopy;  Laterality: N/A;   COLONOSCOPY WITH PROPOFOL N/A 10/20/2018   Procedure: COLONOSCOPY WITH PROPOFOL;   Surgeon: Lollie Sails, MD;  Location: [facility] ENDOSCOPY;  Service: Endoscopy;  Laterality: N/A;   ESOPHAGOGASTRODUODENOSCOPY N/A 05/12/2021   Procedure: ESOPHAGOGASTRODUODENOSCOPY (EGD);  Surgeon: Lesly Rubenstein, MD;  Location[facility] END[facility]COPY;  Service: Endoscopy;  Laterality: N/A;   ESOPHAGOGASTRODUODENOSCOPY (EGD) WITH PROPOFOL N/A 07/19/2016   Procedure: ESOPHAGOGASTRODUODENOSCOPY (EGD) WITH PROPOFOL;  Surgeon: Manya Silvas, MD;  Location: [facility] END[facility];  Service: Endoscopy;  Laterality: N/A;   FLEXIBLE SIGMOIDOSCOPY     KNEE ARTHROSCOPY  08/13/2008   knee replacement and revision     left   RECTOCELE REPAIR     RIGHT/LEFT HEART CATH AND CORONARY ANGIOGRAPHY Bilateral 04/10/2018   Procedure: RIGHT/LEFT HEART CATH AND CORONARY ANGIOGRAPHY;  Surgeon: Wellington Hampshire, MD;  Location: [place] CV LAB;  Service: Cardiovascular;  Laterality: Bilateral;   ROTATOR CUFF REPAIR     bilateral   SHOULDER SURGERY  11/17/2005   TONSILLECTOMY  1962   VAGINAL HYSTERECTOMY  1974   abnormal pap and carcinoma in situ    FAMILY HISTORY: Family History  Problem Relation Age of Onset   Diabetes Mellitus II Father    Thyroid disease Father    Alzheimer's disease Father    Breast cancer Maternal Aunt    Hypertension Mother    Heart Problems Brother    Leukemia Maternal Aunt    Alzheimer's disease Paternal Aunt    Alzheimer's disease Paternal Uncle    Alzheimer's disease Paternal Uncle    Alzheimer's disease Paternal Uncle    Alzheimer's disease Paternal Aunt    Colon cancer Neg Hx    Kidney cancer Neg Hx    Bladder Cancer Neg Hx     ADVANCED DIRECTIVES (Y/N):  N  HEALTH MAINTENANCE: Social History   Tobacco Use   Smoking status: Former    Packs/day: 1.00    Years: 15.00    Additional pack years: 0.00    Total pack years: 15.00    Types: Cigarettes    Quit date: 08/17/1983    Years since quitting: 39.2   Smokeless tobacco: Never  Vaping Use   Vaping Use: Never used   Substance Use Topics   Alcohol use: Yes    Alcohol/week: 0.0 standard drinks of alcohol    Comment: rarely   Drug use: No     Colonoscopy:  PAP:  Bone density:  Lipid panel:  Allergies  Allergen Reactions   Levaquin [Levofloxacin]    Augmentin [Amoxicillin-Pot Clavulanate] Other (See Comments)    Questionable itching   Bactrim [Sulfamethoxazole-Trimethoprim] Rash   Doxycycline Itching   Hydrocodone-Acetaminophen Other (See Comments)    GI distress   Pseudoephedrine Rash    Itching of the scalp   Shellfish Allergy Nausea And Vomiting    Nausea and vomiting     Current Outpatient Medications  Medication Sig Dispense Refill   acetaminophen (TYLENOL) 500 MG tablet Take 500 mg by mouth every 6 (six) hours as needed.     albuterol (  VENTOLIN HFA) 108 (90 Base) MCG/ACT inhaler Inhale 2 puffs into the lungs every 4 (four) hours as needed for wheezing or shortness of breath. 1 each 0   anastrozole (ARIMIDEX) 1 MG tablet Take 1 tablet (1 mg total) by mouth daily. 90 tablet 3   aspirin 325 MG tablet Take 325 mg by mouth daily.     budesonide-formoterol (SYMBICORT) 80-4.5 MCG/ACT inhaler Inhale 2 puffs into the lungs 2 (two) times daily. 1 each 0   Evolocumab (REPATHA) 140 MG/ML SOSY INJECT 140 MG UNDER THE SKIN EVERY 14 DAYS 6 mL 0   furosemide (LASIX) 20 MG tablet Take 1 tablet (20 mg) by mouth once daily as needed for weight gain of 3 lbs or more overnight     linaclotide (LINZESS) 290 MCG CAPS capsule Take 290 mcg by mouth daily before breakfast.     metoprolol succinate (TOPROL-XL) 100 MG 24 hr tablet Take 1 tablet (100 mg total) by mouth daily. 90 tablet 3   Omega-3 Fatty Acids (FISH OIL) 1000 MG CAPS Take 1,000 mg by mouth.     ondansetron (ZOFRAN) 4 MG tablet Take 1 tablet (4 mg total) by mouth 2 (two) times daily as needed for nausea or vomiting. 15 tablet 0   potassium chloride (KLOR-CON) 10 MEQ tablet Take 1 tablet (10 meq) by mouth once daily as needed only when taking lasix  (furosemide)     pregabalin (LYRICA) 50 MG capsule Take by mouth. Take 1 capsule by mouth in the morning and 1 capsules at afternoon     RABEprazole (ACIPHEX) 20 MG tablet Take 20 mg by mouth 2 (two) times daily.     sucralfate (CARAFATE) 1 g tablet Take 1 g by mouth daily as needed (GI sympotms).      venlafaxine XR (EFFEXOR-XR) 150 MG 24 hr capsule Take 1 capsule (150 mg total) by mouth daily with breakfast. 90 capsule 3   No current facility-administered medications for this visit.    OBJECTIVE: Vitals:   10/28/22 1446  BP: 138/63  Pulse: 63  Resp: 18  Temp: (!) 96.3 F (35.7 C)  SpO2: 100%     Body mass index is 39.94 kg/m.    ECOG FS:0 - Asymptomatic  General: Well-developed, well-nourished, no acute distress. Eyes: Pink conjunctiva, anicteric sclera. HEENT: Normocephalic, moist mucous membranes. Lungs: No audible wheezing or coughing. Heart: Regular rate and rhythm. Abdomen: Soft, nontender, no obvious distention. Musculoskeletal: No edema, cyanosis, or clubbing. Neuro: Alert, answering all questions appropriately. Cranial nerves grossly intact. Skin: No rashes or petechiae noted. Psych: Normal affect.  LAB RESULTS:  Lab Results  Component Value Date   NA 142 10/01/2022   K 4.4 10/01/2022   CL 105 10/01/2022   CO2 29 10/01/2022   GLUCOSE 96 10/01/2022   BUN 14 10/01/2022   CREATININE 1.13 10/01/2022   CALCIUM 9.7 10/01/2022   PROT 7.5 10/01/2022   ALBUMIN 4.1 10/01/2022   AST 15 10/01/2022   ALT 13 10/01/2022   ALKPHOS 97 10/01/2022   BILITOT 0.4 10/01/2022   GFRNONAA >60 09/28/2021   GFRAA >60 10/24/2019    Lab Results  Component Value Date   WBC 7.8 10/01/2022   NEUTROABS 3.9 10/01/2022   HGB 13.2 10/01/2022   HCT 40.4 10/01/2022   MCV 86.9 10/01/2022   PLT 444.0 (H) 10/01/2022     STUDIES: No results found.  ASSESSMENT: Atypical hyperplasia  PLAN:    Atypical hyperplasia: Pathology results with no malignancy identified.  Atypical  hyperplasia  increases breast cancer risk 3-5 times over the general population.  Previously, we discussed at length that anastrozole can lead to an approximate 50% risk reduction of developing breast cancer lifetime.  Risks and benefits were discussed including possible side effects.  Despite her mild weight gain and bloating, patient wishes to continue anastrozole for a total of 5 years completing treatment in December 2028.  After lengthy discussion with the patient, it was agreed upon that her primary care physician can monitor and refill medication when necessary.  Please refer patient back if there are any questions or concerns.   Osteopenia: Bone mineral density on September 28, 2022 revealed a T-score of -1.8.  I recommend the patient continue calcium and vitamin D supplementation.  Repeat in February 2025.   Weight gain/abdominal bloating: Possibly secondary to anastrozole.  Monitor.   Patient expressed understanding and was in agreement with this plan. She also understands that She can call clinic at any time with any questions, concerns, or complaints.    Lloyd Huger, MD   10/28/2022 3:16 PM

## 2022-10-28 NOTE — Progress Notes (Signed)
Patient expressed concerns of feeling bloated and weight gain.

## 2022-11-03 DIAGNOSIS — M3501 Sicca syndrome with keratoconjunctivitis: Secondary | ICD-10-CM | POA: Diagnosis not present

## 2022-11-03 DIAGNOSIS — Z961 Presence of intraocular lens: Secondary | ICD-10-CM | POA: Diagnosis not present

## 2022-11-03 DIAGNOSIS — H43813 Vitreous degeneration, bilateral: Secondary | ICD-10-CM | POA: Diagnosis not present

## 2022-11-09 ENCOUNTER — Other Ambulatory Visit: Payer: Self-pay | Admitting: Cardiovascular Disease

## 2022-11-09 ENCOUNTER — Telehealth: Payer: Self-pay | Admitting: Medical

## 2022-11-09 DIAGNOSIS — E782 Mixed hyperlipidemia: Secondary | ICD-10-CM

## 2022-11-09 DIAGNOSIS — I700 Atherosclerosis of aorta: Secondary | ICD-10-CM

## 2022-11-09 MED ORDER — REPATHA 140 MG/ML ~~LOC~~ SOSY: PREFILLED_SYRINGE | SUBCUTANEOUS | 2 refills | Status: DC

## 2022-11-09 NOTE — Telephone Encounter (Signed)
 *  STAT* If patient is at the pharmacy, call can be transferred to refill team.   1. Which medications need to be refilled? (please list name of each medication and dose if known) Evolocumab (REPATHA) 140 MG/ML SOSY   2. Which pharmacy/location (including street and city if local pharmacy) is medication to be sent to? [facility], [address] AT [facility] OF [address]   3. Do they need a 30 day or 90 day supply? 30 days  Pt needs refill today, she said, she's been waiting for refill since last week

## 2022-11-09 NOTE — Telephone Encounter (Signed)
Requested Prescriptions   Signed Prescriptions Disp Refills   Evolocumab (REPATHA) 140 MG/ML SOSY 2 mL 2    Sig: INJECT 140 MG UNDER THE SKIN EVERY 14 DAYS    Authorizing Provider: Antony Madura    Ordering User: Britt Bottom

## 2022-12-15 ENCOUNTER — Other Ambulatory Visit: Payer: Medicare Other

## 2022-12-23 DIAGNOSIS — M5136 Other intervertebral disc degeneration, lumbar region: Secondary | ICD-10-CM | POA: Diagnosis not present

## 2022-12-23 DIAGNOSIS — M47816 Spondylosis without myelopathy or radiculopathy, lumbar region: Secondary | ICD-10-CM | POA: Diagnosis not present

## 2022-12-24 ENCOUNTER — Ambulatory Visit
Admission: RE | Admit: 2022-12-24 | Discharge: 2022-12-24 | Disposition: A | Discharge status: Home / Self Care | Payer: Medicare Other | Source: Ambulatory Visit | Attending: General Surgery | Admitting: General Surgery

## 2022-12-24 ENCOUNTER — Other Ambulatory Visit: Payer: Self-pay | Admitting: General Surgery

## 2022-12-24 DIAGNOSIS — N6091 Unspecified benign mammary dysplasia of right breast: Secondary | ICD-10-CM | POA: Diagnosis not present

## 2022-12-24 DIAGNOSIS — N6092 Unspecified benign mammary dysplasia of left breast: Secondary | ICD-10-CM | POA: Insufficient documentation

## 2022-12-24 DIAGNOSIS — R921 Mammographic calcification found on diagnostic imaging of breast: Secondary | ICD-10-CM | POA: Insufficient documentation

## 2022-12-24 DIAGNOSIS — R928 Other abnormal and inconclusive findings on diagnostic imaging of breast: Secondary | ICD-10-CM

## 2022-12-28 ENCOUNTER — Ambulatory Visit: Payer: Medicare Other | Attending: Cardiovascular Disease | Admitting: Cardiovascular Disease

## 2022-12-28 ENCOUNTER — Encounter: Payer: Self-pay | Admitting: Cardiovascular Disease

## 2022-12-28 VITALS — BP 124/80 | HR 68 | Ht 65.0 in | Wt 239.2 lb

## 2022-12-28 DIAGNOSIS — Z01810 Encounter for preprocedural cardiovascular examination: Secondary | ICD-10-CM

## 2022-12-28 DIAGNOSIS — E785 Hyperlipidemia, unspecified: Secondary | ICD-10-CM

## 2022-12-28 DIAGNOSIS — I10 Essential (primary) hypertension: Secondary | ICD-10-CM

## 2022-12-28 DIAGNOSIS — I5032 Chronic diastolic (congestive) heart failure: Secondary | ICD-10-CM

## 2022-12-28 DIAGNOSIS — I471 Supraventricular tachycardia, unspecified: Secondary | ICD-10-CM

## 2022-12-28 NOTE — Progress Notes (Signed)
Cardiology Office Note   Date:  12/28/2022   ID:  Nicole Sims, DOB 1950-08-19, MRN [id number]  PCP:  Dale Vander, MD  Cardiologist:   Lorine Bears, MD   Chief Complaint  Patient presents with   Follow-up    6 month f/u c/o Heart flutters, elevated BP/HR. Meds reviewed verbally with pt.      History of Present Illness: Nicole Sims is a 72 y.o. female who is here today for follow-up visit regarding paroxysmal SVT and chronic diastolic heart failure.   She has multiple chronic medical conditions that include hyperlipidemia, sleep apnea on CPAP, essential hypertension and obesity.   She had a right and left cardiac catheterization in August 2019 which showed normal coronary arteries and normal LV systolic function.  Right heart catheterization showed mildly elevated filling pressures with pulmonary capillary wedge pressure of 13 mmHg, no pulmonary hypertension and normal cardiac output. CTA of the chest showed no evidence of pulmonary embolism. She had a previously monitor for palpitations which showed normal sinus rhythm with a heart rate of 80 bpm.  She had short runs of SVT the longest lasted 7 beats.  The dose of Toprol was increased to 100 mg once daily with significant improvement in symptoms.  Most recent echocardiogram in March 2023 showed normal LV systolic function, normal diastolic function and no significant valvular abnormalities.  She has been doing reasonably well overall.  She does report mild dizziness but no syncope or presyncope.  She has occasional episodes of chest pain described as aching in the substernal area that does not correlate with physical activities.  Past Medical History:  Diagnosis Date   Anemia    Anginal pain (HCC)    Anxiety    Asthma    Cataract cortical, senile    CHF (congestive heart failure) (HCC)    Chronic headaches    Diastolic heart failure (HCC)    Diverticulosis    Environmental allergies    GERD (gastroesophageal  reflux disease)    Heart murmur    Hyperglycemia    Hyperlipidemia    Hypertension    Leukocytosis    Obesity    Osteoarthritis    Palpitations    Restless leg syndrome    Sleep apnea    Thrombocytosis    Urinary incontinence    mixed   Venous insufficiency    Vitamin D deficiency    Wears dentures    partial upper    Past Surgical History:  Procedure Laterality Date   BLADDER SURGERY     x2   [facility]   BREAST BIOPSY Left 06/24/2022   Stereo Bx, X-clip, neg   BREAST BIOPSY Left 06/24/2022   Stereo Bx, ribbon clip, atypical lobular hyperplasis   BREAST BIOPSY  06/24/2022   Stereo Bx, Coil Clip, neg   BREAST BIOPSY Left 06/24/2022   MM LT BREAST BX W LOC DEV 1ST LESION IMAGE BX SPEC STEREO GUIDE 06/24/2022 [facility]   BREAST BIOPSY Left 06/24/2022   MM LT BREAST BX W LOC DEV EA AD LESION IMG BX SPEC STEREO GUIDE 06/24/2022 [facility]   BREAST BIOPSY Left 06/24/2022   MM LT BREAST BX W LOC DEV EA AD LESION IMG BX SPEC STEREO GUIDE 06/24/2022 [facility]   BREAST CYST ASPIRATION Bilateral 2005   approximate year   CARDIAC CATHETERIZATION     [name]   CATARACT EXTRACTION W/PHACO Right 04/24/2020   Procedure: CATARACT EXTRACTION PHACO AND INTRAOCULAR LENS PLACEMENT (IOC) RIGHT;  Surgeon: Galen Manila, MD;  Location: [facility];  Service: Ophthalmology;  Laterality: Right;  6.75 0:37.3   CATARACT EXTRACTION W/PHACO Left 01/27/2021   Procedure: CATARACT EXTRACTION PHACO AND INTRAOCULAR LENS PLACEMENT (IOC) LEFT;  Surgeon: Galen Manila, MD;  Location: [facility];  Service: Ophthalmology;  Laterality: Left;  6.75 00:43.9   CERVICAL CONE BIOPSY     CIS   CHOLECYSTECTOMY     COLONOSCOPY WITH PROPOFOL N/A 07/19/2016   Procedure: COLONOSCOPY WITH PROPOFOL;  Surgeon: Scot Jun, MD;  Location: [facility] ENDOSCOPY;  Service: Endoscopy;  Laterality: N/A;   COLONOSCOPY WITH PROPOFOL N/A 10/20/2018   Procedure: COLONOSCOPY  WITH PROPOFOL;  Surgeon: Christena Deem, MD;  Location: [facility] ENDOSCOPY;  Service: Endoscopy;  Laterality: N/A;   ESOPHAGOGASTRODUODENOSCOPY N/A 05/12/2021   Procedure: ESOPHAGOGASTRODUODENOSCOPY (EGD);  Surgeon: Regis Bill, MD;  Location: [facility] ENDOSCOPY;  Service: Endoscopy;  Laterality: N/A;   ESOPHAGOGASTRODUODENOSCOPY (EGD) WITH PROPOFOL N/A 07/19/2016   Procedure: ESOPHAGOGASTRODUODENOSCOPY (EGD) WITH PROPOFOL;  Surgeon: Scot Jun, MD;  Location: [facility] ENDOSCOPY;  Service: Endoscopy;  Laterality: N/A;   FLEXIBLE SIGMOIDOSCOPY     KNEE ARTHROSCOPY  08/13/2008   knee replacement and revision     left   RECTOCELE REPAIR     RIGHT/LEFT HEART CATH AND CORONARY ANGIOGRAPHY Bilateral 04/10/2018   Procedure: RIGHT/LEFT HEART CATH AND CORONARY ANGIOGRAPHY;  Surgeon: Iran Ouch, MD;  Location: [facility];  Service: Cardiovascular;  Laterality: Bilateral;   ROTATOR CUFF REPAIR     bilateral   SHOULDER SURGERY  11/17/2005   TONSILLECTOMY  1962   VAGINAL HYSTERECTOMY  1974   abnormal pap and carcinoma in situ     Current Outpatient Medications  Medication Sig Dispense Refill   acetaminophen (TYLENOL) 500 MG tablet Take 500 mg by mouth every 6 (six) hours as needed.     albuterol (VENTOLIN HFA) 108 (90 Base) MCG/ACT inhaler Inhale 2 puffs into the lungs every 4 (four) hours as needed for wheezing or shortness of breath. 1 each 0   anastrozole (ARIMIDEX) 1 MG tablet Take 1 tablet (1 mg total) by mouth daily. 90 tablet 3   aspirin 325 MG tablet Take 325 mg by mouth daily.     budesonide-formoterol (SYMBICORT) 80-4.5 MCG/ACT inhaler Inhale 2 puffs into the lungs 2 (two) times daily. 1 each 0   Evolocumab (REPATHA) 140 MG/ML SOSY INJECT 140 MG UNDER THE SKIN EVERY 14 DAYS 2 mL 2   furosemide (LASIX) 20 MG tablet Take 1 tablet (20 mg) by mouth once daily as needed for weight gain of 3 lbs or more overnight     linaclotide (LINZESS) 290 MCG CAPS capsule Take 290 mcg  by mouth daily before breakfast.     metoprolol succinate (TOPROL-XL) 100 MG 24 hr tablet Take 1 tablet (100 mg total) by mouth daily. 90 tablet 3   Omega-3 Fatty Acids (FISH OIL) 1000 MG CAPS Take 1,000 mg by mouth.     ondansetron (ZOFRAN) 4 MG tablet Take 1 tablet (4 mg total) by mouth 2 (two) times daily as needed for nausea or vomiting. 15 tablet 0   potassium chloride (KLOR-CON) 10 MEQ tablet Take 1 tablet (10 meq) by mouth once daily as needed only when taking lasix (furosemide)     pregabalin (LYRICA) 50 MG capsule Take by mouth. Take 1 capsule by mouth in the morning and 1 capsules at afternoon     RABEprazole (ACIPHEX) 20 MG tablet Take 20 mg by  mouth 2 (two) times daily.     sucralfate (CARAFATE) 1 g tablet Take 1 g by mouth daily as needed (GI sympotms).      venlafaxine XR (EFFEXOR-XR) 150 MG 24 hr capsule Take 1 capsule (150 mg total) by mouth daily with breakfast. 90 capsule 3   No current facility-administered medications for this visit.    Allergies:   Levaquin [levofloxacin], Augmentin [amoxicillin-pot clavulanate], Bactrim [sulfamethoxazole-trimethoprim], Doxycycline, Hydrocodone-acetaminophen, Pseudoephedrine, and Shellfish allergy    Social History:  The patient  reports that she quit smoking about 39 years ago. Her smoking use included cigarettes. She has a 15.00 pack-year smoking history. She has never used smokeless tobacco. She reports current alcohol use. She reports that she does not use drugs.   Family History:  The patient's family history includes Alzheimer's disease in her father, paternal aunt, paternal aunt, paternal uncle, paternal uncle, and paternal uncle; Breast cancer in her maternal aunt; Diabetes Mellitus II in her father; Heart Problems in her brother; Hypertension in her mother; Leukemia in her maternal aunt; Thyroid disease in her father.    ROS:  Please see the history of present illness.   Otherwise, review of systems are positive for none.   All  other systems are reviewed and negative.    PHYSICAL EXAM: VS:  BP 124/80 (BP Location: Left Arm, Patient Position: Sitting, Cuff Size: Large)   Pulse 68   Ht 5\' 5"  (1.651 m)   Wt 239 lb 4 oz (108.5 kg)   SpO2 97%   BMI 39.81 kg/m  , BMI Body mass index is 39.81 kg/m. GEN: Well nourished, well developed, in no acute distress  HEENT: normal  Neck: no JVD, carotid bruits, or masses Cardiac: RRR; no murmurs, rubs, or gallops,no edema  Respiratory:  clear to auscultation bilaterally, normal work of breathing GI: soft, nontender, nondistended, + BS MS: no deformity or atrophy  Skin: warm and dry, no rash Neuro:  Strength and sensation are intact Psych: euthymic mood, full affect   EKG:  EKG is ordered today. The ekg ordered today demonstrates normal sinus rhythm with no significant ST or T wave changes.   Recent Labs: 02/25/2022: TSH 2.56 10/01/2022: ALT 13; BUN 14; Creatinine, Ser 1.13; Hemoglobin 13.2; Platelets 444.0; Potassium 4.4; Sodium 142    Lipid Panel    Component Value Date/Time   CHOL 183 10/01/2022 0915   CHOL 145 11/17/2021 0941   TRIG 128.0 10/01/2022 0915   HDL 61.40 10/01/2022 0915   HDL 60 11/17/2021 0941   CHOLHDL 3 10/01/2022 0915   VLDL 25.6 10/01/2022 0915   LDLCALC 96 10/01/2022 0915   LDLCALC 66 11/17/2021 0941   LDLDIRECT 172.8 09/17/2013 0950      Wt Readings from Last 3 Encounters:  12/28/22 239 lb 4 oz (108.5 kg)  10/28/22 240 lb (108.9 kg)  10/05/22 235 lb (106.6 kg)          04/04/2018    1:42 PM  PAD Screen  Previous PAD dx? No  Previous surgical procedure? No  Pain with walking? Yes  Subsides with rest? Yes  Feet/toe relief with dangling? No  Painful, non-healing ulcers? No  Extremities discolored? No      ASSESSMENT AND PLAN:  1.  Paroxysmal SVT: Symptoms are reasonably controlled with current dose of Toprol 100 mg once daily.    2.  Chronic diastolic heart failure: He uses furosemide as needed very infrequently.   She appears to be euvolemic by exam.  3.  Hyperlipidemia:  She was switched from rosuvastatin to Repatha due to concerns about memory issues.  She is tolerating the medication with no reported side effects.  Most recent lipid profile showed an LDL of 96.  4.  Essential hypertension: Blood pressure is well-controlled on current medications.  5.  Preop cardiovascular evaluation for colonoscopy: She has stable cardiac symptoms and EKG is normal.  She is at low risk for colonoscopy with no need for further cardiac workup.  Aspirin can be held 5 days before the procedure if needed.   Disposition:   FU  in 1 year.  Signed,  Lorine Bears, MD  12/28/2022 2:57 PM    [facility]

## 2022-12-28 NOTE — Patient Instructions (Signed)
Medication Instructions:  No changes *If you need a refill on your cardiac medications before your next appointment, please call your pharmacy*   Lab Work: None ordered If you have labs (blood work) drawn today and your tests are completely normal, you will receive your results only by: MyChart Message (if you have MyChart) OR A paper copy in the mail If you have any lab test that is abnormal or we need to change your treatment, we will call you to review the results.   Testing/Procedures: None ordered   Follow-Up: At [facility], you and your health needs are our priority.  As part of our continuing mission to provide you with exceptional heart care, we have created designated Provider Care Teams.  These Care Teams include your primary Cardiologist (physician) and Advanced Practice Providers (APPs -  Physician Assistants and Nurse Practitioners) who all work together to provide you with the care you need, when you need it.  We recommend signing up for the patient portal called "MyChart".  Sign up information is provided on this After Visit Summary.  MyChart is used to connect with patients for Virtual Visits (Telemedicine).  Patients are able to view lab/test results, encounter notes, upcoming appointments, etc.  Non-urgent messages can be sent to your provider as well.   To learn more about what you can do with MyChart, go to [email or web address].    Your next appointment:   12 month(s)  Provider:   You may see Muhammad Arida, MD or one of the following Advanced Practice Providers on your designated Care Team:   Christopher Berge, NP Ryan Dunn, PA-C Cadence Furth, PA-C Sheri Hammock, NP    

## 2022-12-31 DIAGNOSIS — N6092 Unspecified benign mammary dysplasia of left breast: Secondary | ICD-10-CM | POA: Diagnosis not present

## 2022-12-31 DIAGNOSIS — N6091 Unspecified benign mammary dysplasia of right breast: Secondary | ICD-10-CM | POA: Diagnosis not present

## 2022-12-31 DIAGNOSIS — N6489 Other specified disorders of breast: Secondary | ICD-10-CM | POA: Diagnosis not present

## 2023-01-03 ENCOUNTER — Other Ambulatory Visit: Payer: Self-pay | Admitting: Medical

## 2023-01-03 DIAGNOSIS — E782 Mixed hyperlipidemia: Secondary | ICD-10-CM

## 2023-01-03 DIAGNOSIS — I700 Atherosclerosis of aorta: Secondary | ICD-10-CM

## 2023-01-03 NOTE — Telephone Encounter (Signed)
Follow up FLP is due, called patient. Will be going next month.

## 2023-01-05 DIAGNOSIS — M47816 Spondylosis without myelopathy or radiculopathy, lumbar region: Secondary | ICD-10-CM | POA: Diagnosis not present

## 2023-01-07 ENCOUNTER — Encounter: Payer: Self-pay | Admitting: *Deleted

## 2023-01-11 ENCOUNTER — Ambulatory Visit
Admission: RE | Admit: 2023-01-11 | Discharge: 2023-01-11 | Disposition: A | Discharge status: Home / Self Care | Payer: Medicare Other | Attending: Gastroenterology | Admitting: Gastroenterology

## 2023-01-11 ENCOUNTER — Encounter
Admission: RE | Disposition: A | Discharge status: Home / Self Care | Payer: Self-pay | Source: Home / Self Care | Attending: Gastroenterology

## 2023-01-11 ENCOUNTER — Ambulatory Visit: Payer: Medicare Other | Admitting: Anesthesiology

## 2023-01-11 ENCOUNTER — Encounter: Payer: Self-pay | Admitting: *Deleted

## 2023-01-11 DIAGNOSIS — K5730 Diverticulosis of large intestine without perforation or abscess without bleeding: Secondary | ICD-10-CM | POA: Diagnosis not present

## 2023-01-11 DIAGNOSIS — K921 Melena: Secondary | ICD-10-CM | POA: Diagnosis not present

## 2023-01-11 DIAGNOSIS — Z7982 Long term (current) use of aspirin: Secondary | ICD-10-CM | POA: Insufficient documentation

## 2023-01-11 DIAGNOSIS — Z8673 Personal history of transient ischemic attack (TIA), and cerebral infarction without residual deficits: Secondary | ICD-10-CM | POA: Diagnosis not present

## 2023-01-11 DIAGNOSIS — F419 Anxiety disorder, unspecified: Secondary | ICD-10-CM | POA: Insufficient documentation

## 2023-01-11 DIAGNOSIS — Z09 Encounter for follow-up examination after completed treatment for conditions other than malignant neoplasm: Secondary | ICD-10-CM | POA: Diagnosis not present

## 2023-01-11 DIAGNOSIS — K640 First degree hemorrhoids: Secondary | ICD-10-CM | POA: Diagnosis not present

## 2023-01-11 DIAGNOSIS — J45909 Unspecified asthma, uncomplicated: Secondary | ICD-10-CM | POA: Insufficient documentation

## 2023-01-11 DIAGNOSIS — K648 Other hemorrhoids: Secondary | ICD-10-CM | POA: Diagnosis not present

## 2023-01-11 DIAGNOSIS — I5032 Chronic diastolic (congestive) heart failure: Secondary | ICD-10-CM | POA: Diagnosis not present

## 2023-01-11 DIAGNOSIS — Z86010 Personal history of colonic polyps: Secondary | ICD-10-CM | POA: Insufficient documentation

## 2023-01-11 DIAGNOSIS — K219 Gastro-esophageal reflux disease without esophagitis: Secondary | ICD-10-CM | POA: Insufficient documentation

## 2023-01-11 DIAGNOSIS — I110 Hypertensive heart disease with heart failure: Secondary | ICD-10-CM | POA: Diagnosis not present

## 2023-01-11 DIAGNOSIS — M1990 Unspecified osteoarthritis, unspecified site: Secondary | ICD-10-CM | POA: Diagnosis not present

## 2023-01-11 DIAGNOSIS — G2581 Restless legs syndrome: Secondary | ICD-10-CM | POA: Insufficient documentation

## 2023-01-11 DIAGNOSIS — I5030 Unspecified diastolic (congestive) heart failure: Secondary | ICD-10-CM | POA: Diagnosis not present

## 2023-01-11 DIAGNOSIS — Z87891 Personal history of nicotine dependence: Secondary | ICD-10-CM | POA: Diagnosis not present

## 2023-01-11 DIAGNOSIS — K589 Irritable bowel syndrome without diarrhea: Secondary | ICD-10-CM | POA: Insufficient documentation

## 2023-01-11 DIAGNOSIS — K625 Hemorrhage of anus and rectum: Secondary | ICD-10-CM | POA: Diagnosis not present

## 2023-01-11 HISTORY — PX: COLONOSCOPY WITH PROPOFOL: SHX5780

## 2023-01-11 HISTORY — DX: Cerebral infarction, unspecified: I63.9

## 2023-01-11 SURGERY — COLONOSCOPY WITH PROPOFOL
Anesthesia: General

## 2023-01-11 MED ORDER — SODIUM CHLORIDE 0.9 % IV SOLN: INTRAVENOUS | Status: DC

## 2023-01-11 MED ORDER — LIDOCAINE HCL (PF) 2 % IJ SOLN
INTRAMUSCULAR | Status: AC
  Filled 2023-01-11: qty 5

## 2023-01-11 MED ORDER — PROPOFOL 1000 MG/100ML IV EMUL
INTRAVENOUS | Status: AC
  Filled 2023-01-11: qty 100

## 2023-01-11 MED ORDER — LIDOCAINE HCL (CARDIAC) PF 100 MG/5ML IV SOSY
PREFILLED_SYRINGE | INTRAVENOUS | Status: DC | PRN
  Administered 2023-01-11: 20 mg via INTRAVENOUS

## 2023-01-11 MED ORDER — PROPOFOL 500 MG/50ML IV EMUL
INTRAVENOUS | Status: DC | PRN
  Administered 2023-01-11: 125 ug/kg/min via INTRAVENOUS

## 2023-01-11 MED ORDER — PROPOFOL 10 MG/ML IV BOLUS
INTRAVENOUS | Status: DC | PRN
  Administered 2023-01-11: 100 mg via INTRAVENOUS

## 2023-01-11 NOTE — H&P (Signed)
Outpatient short stay form Pre-procedure 01/11/2023  Regis Bill, MD  Primary Physician: Dale [place] Wh[place], MD  Reason for visit:  Rectal bleeding  History of present illness:    72 y/o lady with history of morbid obesity, IBS-C, HFpEF, and hypertension here for colonoscopy due to small volume hematochezia. Last colonoscopy in 2020 with serrated polyps. No blood thinners except aspirin. No family history of GI malignancies. No significant abdominal surgeries except vaginal hysterectomy.    Current Facility-Administered Medications:    0.9 %  sodium chloride infusion, , Intravenous, Continuous, Florentina Marquart, Rossie Muskrat, MD, Last Rate: 20 mL/hr at 01/11/23 0845, New Bag at 01/11/23 0845  Medications Prior to Admission  Medication Sig Dispense Refill Last Dose   albuterol (VENTOLIN HFA) 108 (90 Base) MCG/ACT inhaler Inhale 2 puffs into the lungs every 4 (four) hours as needed for wheezing or shortness of breath. 1 each 0 Past Month   anastrozole (ARIMIDEX) 1 MG tablet Take 1 tablet (1 mg total) by mouth daily. 90 tablet 3 Past Week   budesonide-formoterol (SYMBICORT) 80-4.5 MCG/ACT inhaler Inhale 2 puffs into the lungs 2 (two) times daily. 1 each 0 01/11/2023   metoprolol succinate (TOPROL-XL) 100 MG 24 hr tablet Take 1 tablet (100 mg total) by mouth daily. 90 tablet 3 01/11/2023   RABEprazole (ACIPHEX) 20 MG tablet Take 20 mg by mouth 2 (two) times daily.   01/10/2023   sucralfate (CARAFATE) 1 g tablet Take 1 g by mouth daily as needed (GI sympotms).    Past Week   venlafaxine XR (EFFEXOR-XR) 150 MG 24 hr capsule Take 1 capsule (150 mg total) by mouth daily with breakfast. 90 capsule 3 Past Week   acetaminophen (TYLENOL) 500 MG tablet Take 500 mg by mouth every 6 (six) hours as needed.      aspirin 325 MG tablet Take 325 mg by mouth daily.   01/09/2023   Evolocumab (REPATHA) 140 MG/ML SOSY INJECT 140 MG UNDER THE SKIN EVERY 14 DAYS 6 mL 3 01/07/2023   furosemide (LASIX) 20 MG tablet Take 1  tablet (20 mg) by mouth once daily as needed for weight gain of 3 lbs or more overnight      linaclotide (LINZESS) 290 MCG CAPS capsule Take 290 mcg by mouth daily before breakfast.      Omega-3 Fatty Acids (FISH OIL) 1000 MG CAPS Take 1,000 mg by mouth.      ondansetron (ZOFRAN) 4 MG tablet Take 1 tablet (4 mg total) by mouth 2 (two) times daily as needed for nausea or vomiting. 15 tablet 0    potassium chloride (KLOR-CON) 10 MEQ tablet Take 1 tablet (10 meq) by mouth once daily as needed only when taking lasix (furosemide)      pregabalin (LYRICA) 50 MG capsule Take by mouth. Take 1 capsule by mouth in the morning and 1 capsules at afternoon        Allergies  Allergen Reactions   Levaquin [Levofloxacin]    Augmentin [Amoxicillin-Pot Clavulanate] Other (See Comments)    Questionable itching   Bactrim [Sulfamethoxazole-Trimethoprim] Rash   Doxycycline Itching   Hydrocodone-Acetaminophen Other (See Comments)    GI distress   Pseudoephedrine Rash    Itching of the scalp   Shellfish Allergy Nausea And Vomiting    Nausea and vomiting      Past Medical History:  Diagnosis Date   Anemia    Anginal pain (HCC)    Anxiety    Asthma    Cataract cortical, senile  CHF (congestive heart failure) (HCC)    Chronic headaches    Diastolic heart failure (HCC)    Diverticulosis    Environmental allergies    GERD (gastroesophageal reflux disease)    Heart murmur    Hyperglycemia    Hyperlipidemia    Hypertension    Leukocytosis    Obesity    Osteoarthritis    Palpitations    Restless leg syndrome    Sleep apnea    Stroke (HCC)    Thrombocytosis    Urinary incontinence    mixed   Venous insufficiency    Vitamin D deficiency    Wears dentures    partial upper    Review of systems:  Otherwise negative.    Physical Exam  Gen: Alert, oriented. Appears stated age.  HEENT: PERRLA. Lungs: No respiratory distress CV: RRR Abd: soft, benign, no masses Ext: No  edema    Planned procedures: Proceed with colonoscopy. The patient understands the nature of the planned procedure, indications, risks, alternatives and potential complications including but not limited to bleeding, infection, perforation, damage to internal organs and possible oversedation/side effects from anesthesia. The patient agrees and gives consent to proceed.  Please refer to procedure notes for findings, recommendations and patient disposition/instructions.     Regis Bill, MD [facility]

## 2023-01-11 NOTE — Interval H&P Note (Signed)
History and Physical Interval Note:  01/11/2023 8:59 AM  Nicole Sims  has presented today for surgery, with the diagnosis of rectal bleeding h/o TA Polyps.  The various methods of treatment have been discussed with the patient and family. After consideration of risks, benefits and other options for treatment, the patient has consented to  Procedure(s): COLONOSCOPY WITH PROPOFOL (N/A) as a surgical intervention.  The patient's history has been reviewed, patient examined, no change in status, stable for surgery.  I have reviewed the patient's chart and labs.  Questions were answered to the patient's satisfaction.     Regis Bill  Ok to proceed with colonoscopy

## 2023-01-11 NOTE — Anesthesia Postprocedure Evaluation (Signed)
Anesthesia Post Note  Patient: Nicole Sims  Procedure(s) Performed: COLONOSCOPY WITH PROPOFOL  Patient location during evaluation: Endoscopy Anesthesia Type: General Level of consciousness: awake and alert Pain management: pain level controlled Vital Signs Assessment: post-procedure vital signs reviewed and stable Respiratory status: spontaneous breathing, nonlabored ventilation, respiratory function stable and patient connected to nasal cannula oxygen Cardiovascular status: blood pressure returned to baseline and stable Postop Assessment: no apparent nausea or vomiting Anesthetic complications: no   No notable events documented.   Last Vitals:  Vitals:   01/11/23 0939 01/11/23 0949  BP: 122/67 139/68  Pulse: 63 (!) 59  Resp: 19 18  Temp:  (!) 35.7 C  SpO2: 100% 100%    Last Pain:  Vitals:   01/11/23 0949  TempSrc: Temporal  PainSc: 0-No pain                 Cleda Mccreedy Mirely Pangle

## 2023-01-11 NOTE — Op Note (Signed)
[facility] Patient Name: Nicole Sims Procedure Date: 01/11/2023 8:55 AM MRN: [id number] Account #: [id number] Date of Birth: March 04, 1951 Admit Type: Outpatient Age: 72 Room: [facility] ENDO ROOM 1 Gender: Female Note Status: Supervisor Override Instrument Name: Prentice Docker [id number] Procedure:             Colonoscopy Indications:           Hematochezia, Personal history of colonic polyps Providers:             Eather Colas MD, MD Referring MD:          Bonnita Nasuti [facility]. [facility], dr (Referring MD) Medicines:             Monitored Anesthesia Care Complications:         No immediate complications. Procedure:             Pre-Anesthesia Assessment:                        - Prior to the procedure, a History and Physical was                         performed, and patient medications and allergies were                         reviewed. The patient is competent. The risks and                         benefits of the procedure and the sedation options and                         risks were discussed with the patient. All questions                         were answered and informed consent was obtained.                         Patient identification and proposed procedure were                         verified by the physician, the nurse, the                         anesthesiologist, the anesthetist and the technician                         in the endoscopy suite. Mental Status Examination:                         alert and oriented. Airway Examination: normal                         oropharyngeal airway and neck mobility. Respiratory                         Examination: clear to auscultation. CV Examination:                         normal. Prophylactic Antibiotics: The patient does not  require prophylactic antibiotics. Prior                         Anticoagulants: The patient has taken no anticoagulant                         or  antiplatelet agents except for aspirin. ASA Grade                         Assessment: III - A patient with severe systemic                         disease. After reviewing the risks and benefits, the                         patient was deemed in satisfactory condition to                         undergo the procedure. The anesthesia plan was to use                         monitored anesthesia care (MAC). Immediately prior to                         administration of medications, the patient was                         re-assessed for adequacy to receive sedatives. The                         heart rate, respiratory rate, oxygen saturations,                         blood pressure, adequacy of pulmonary ventilation, and                         response to care were monitored throughout the                         procedure. The physical status of the patient was                         re-assessed after the procedure.                        After obtaining informed consent, the colonoscope was                         passed under direct vision. Throughout the procedure,                         the patient's blood pressure, pulse, and oxygen                         saturations were monitored continuously. The                         Colonoscope was introduced through the anus and  advanced to the the terminal ileum. The colonoscopy                         was performed without difficulty. The patient                         tolerated the procedure well. The quality of the bowel                         preparation was fair. The terminal ileum, ileocecal                         valve, appendiceal orifice, and rectum were                         photographed. Findings:      The perianal and digital rectal examinations were normal.      The terminal ileum appeared normal.      A few small-mouthed diverticula were found in the sigmoid colon.      Internal hemorrhoids were  found during endoscopy. The hemorrhoids were       Grade I (internal hemorrhoids that do not prolapse).      Retroflexion in the rectum was not performed due to small rectal vault. Impression:            - Preparation of the colon was fair.                        - The examined portion of the ileum was normal.                        - Diverticulosis in the sigmoid colon.                        - Internal hemorrhoids.                        - No specimens collected. Recommendation:        - Discharge patient to home.                        - Resume previous diet.                        - Continue present medications.                        - Repeat colonoscopy in 2-3 years because the bowel                         preparation was suboptimal.                        - Return to referring physician as previously                         scheduled. Procedure Code(s):     --- Professional ---                        [phone], Colonoscopy, flexible; diagnostic, including  collection of specimen(s) by brushing or washing, when                         performed (separate procedure) Diagnosis Code(s):     --- Professional ---                        K64.0, First degree hemorrhoids                        K92.1, Melena (includes Hematochezia)                        K57.30, Diverticulosis of large intestine without                         perforation or abscess without bleeding CPT copyright 2022 American [employer]. All rights reserved. The codes documented in this report are preliminary and upon coder review may  be revised to meet current compliance requirements. Eather Colas MD, MD 01/11/2023 9:28:28 AM Number of Addenda: 0 Note Initiated On: 01/11/2023 8:55 AM Scope Withdrawal Time: 0 hours 8 minutes 41 seconds  Total Procedure Duration: 0 hours 14 minutes 22 seconds  Estimated Blood Loss:  Estimated blood loss: none.      [facility]

## 2023-01-11 NOTE — Transfer of Care (Signed)
Immediate Anesthesia Transfer of Care Note  Patient: Nicole Sims  Procedure(s) Performed: COLONOSCOPY WITH PROPOFOL  Patient Location: Endoscopy Unit  Anesthesia Type:General  Level of Consciousness: drowsy  Airway & Oxygen Therapy: Patient Spontanous Breathing  Post-op Assessment: Report given to RN  Post vital signs: stable  Last Vitals:  Vitals Value Taken Time  BP 109/55 01/11/23 0929  Temp    Pulse 66 01/11/23 0929  Resp 18 01/11/23 0929  SpO2 97 % 01/11/23 0929    Last Pain:  Vitals:   01/11/23 0929  TempSrc:   PainSc: Asleep         Complications: No notable events documented.

## 2023-01-11 NOTE — Anesthesia Preprocedure Evaluation (Signed)
Anesthesia Evaluation  Patient identified by MRN, date of birth, ID band Patient awake    Reviewed: Allergy & Precautions, NPO status , Patient's Chart, lab work & pertinent test results  History of Anesthesia Complications Negative for: history of anesthetic complications  Airway Mallampati: III  TM Distance: <3 FB Neck ROM: full    Dental  (+) Chipped   Pulmonary neg shortness of breath, asthma , sleep apnea , former smoker   Pulmonary exam normal        Cardiovascular Exercise Tolerance: Good hypertension, (-) angina +CHF  Normal cardiovascular exam+ Valvular Problems/Murmurs      Neuro/Psych  Headaches PSYCHIATRIC DISORDERS       Neuromuscular disease CVA    GI/Hepatic Neg liver ROS,GERD  Controlled,,  Endo/Other  negative endocrine ROS    Renal/GU negative Renal ROS  negative genitourinary   Musculoskeletal   Abdominal   Peds  Hematology negative hematology ROS (+)   Anesthesia Other Findings Past Medical History: No date: Anemia No date: Anginal pain (HCC) No date: Anxiety No date: Asthma No date: Cataract cortical, senile No date: CHF (congestive heart failure) (HCC) No date: Chronic headaches No date: Diastolic heart failure (HCC) No date: Diverticulosis No date: Environmental allergies No date: GERD (gastroesophageal reflux disease) No date: Heart murmur No date: Hyperglycemia No date: Hyperlipidemia No date: Hypertension No date: Leukocytosis No date: Obesity No date: Osteoarthritis No date: Palpitations No date: Restless leg syndrome No date: Sleep apnea No date: Stroke [facility]) No date: Thrombocytosis No date: Urinary incontinence     Comment:  mixed No date: Venous insufficiency No date: Vitamin D deficiency No date: Wears dentures     Comment:  partial upper  Past Surgical History: No date: BLADDER SURGERY     Comment:  x2   [facility] 06/24/2022: BREAST BIOPSY; Left      Comment:  Stereo Bx, X-clip, neg 06/24/2022: BREAST BIOPSY; Left     Comment:  Stereo Bx, ribbon clip, atypical lobular hyperplasis 06/24/2022: BREAST BIOPSY     Comment:  Stereo Bx, Coil Clip, neg 06/24/2022: BREAST BIOPSY; Left     Comment:  MM LT BREAST BX W LOC DEV 1ST LESION IMAGE BX SPEC               STEREO GUIDE 06/24/2022 [facility] 06/24/2022: BREAST BIOPSY; Left     Comment:  MM LT BREAST BX W LOC DEV EA AD LESION IMG BX SPEC               STEREO GUIDE 06/24/2022 [facility] 06/24/2022: BREAST BIOPSY; Left     Comment:  MM LT BREAST BX W LOC DEV EA AD LESION IMG BX SPEC               STEREO GUIDE 06/24/2022 [facility] 2005: BREAST CYST ASPIRATION; Bilateral     Comment:  approximate year No date: CARDIAC CATHETERIZATION     Comment:  [name] 04/24/2020: CATARACT EXTRACTION W/PHACO; Right     Comment:  Procedure: CATARACT EXTRACTION PHACO AND INTRAOCULAR               LENS PLACEMENT (IOC) RIGHT;  Surgeon: Galen Manila,               MD;  Location: [facility];  Service:               Ophthalmology;  Laterality: Right;  6.75 0:37.3 01/27/2021: CATARACT EXTRACTION W/PHACO; Left     Comment:  Procedure: CATARACT EXTRACTION PHACO  AND INTRAOCULAR               LENS PLACEMENT (IOC) LEFT;  Surgeon: Galen Manila,               MD;  Location: [facility];  Service:               Ophthalmology;  Laterality: Left;  6.75 00:43.9 No date: CERVICAL CONE BIOPSY     Comment:  CIS No date: CHOLECYSTECTOMY 07/19/2016: COLONOSCOPY WITH PROPOFOL; N/A     Comment:  Procedure: COLONOSCOPY WITH PROPOFOL;  Surgeon: Scot Jun, MD;  Location: [facility] ENDOSCOPY;  Service:               Endoscopy;  Laterality: N/A; 10/20/2018: COLONOSCOPY WITH PROPOFOL; N/A     Comment:  Procedure: COLONOSCOPY WITH PROPOFOL;  Surgeon:               Christena Deem, MD;  Location: [facility] ENDOSCOPY;                Service: Endoscopy;  Laterality:  N/A; 05/12/2021: ESOPHAGOGASTRODUODENOSCOPY; N/A     Comment:  Procedure: ESOPHAGOGASTRODUODENOSCOPY (EGD);  Surgeon:               Regis Bill, MD;  Location: [facility] ENDOSCOPY;                Service: Endoscopy;  Laterality: N/A; 07/19/2016: ESOPHAGOGASTRODUODENOSCOPY (EGD) WITH PROPOFOL; N/A     Comment:  Procedure: ESOPHAGOGASTRODUODENOSCOPY (EGD) WITH               PROPOFOL;  Surgeon: Scot Jun, MD;  Location: [facility]              ENDOSCOPY;  Service: Endoscopy;  Laterality: N/A; No date: FLEXIBLE SIGMOIDOSCOPY 08/13/2008: KNEE ARTHROSCOPY No date: knee replacement and revision     Comment:  left No date: RECTOCELE REPAIR 04/10/2018: RIGHT/LEFT HEART CATH AND CORONARY ANGIOGRAPHY; Bilateral     Comment:  Procedure: RIGHT/LEFT HEART CATH AND CORONARY               ANGIOGRAPHY;  Surgeon: Iran Ouch, MD;  Location:               [facility];  Service: Cardiovascular;                Laterality: Bilateral; No date: ROTATOR CUFF REPAIR     Comment:  bilateral 11/17/2005: SHOULDER SURGERY 1962: TONSILLECTOMY 1974: VAGINAL HYSTERECTOMY     Comment:  abnormal pap and carcinoma in situ     Reproductive/Obstetrics negative OB ROS                             Anesthesia Physical Anesthesia Plan  ASA: 3  Anesthesia Plan: General   Post-op Pain Management:    Induction: Intravenous  PONV Risk Score and Plan: Propofol infusion and TIVA  Airway Management Planned: Natural Airway and Nasal Cannula  Additional Equipment:   Intra-op Plan:   Post-operative Plan:   Informed Consent: I have reviewed the patients History and Physical, chart, labs and discussed the procedure including the risks, benefits and alternatives for the proposed anesthesia with the patient or authorized representative who has indicated his/her understanding and acceptance.     Dental Advisory Given  Plan Discussed with: Anesthesiologist, CRNA and  Surgeon  Anesthesia Plan  Comments: (Patient consented for risks of anesthesia including but not limited to:  - adverse reactions to medications - risk of airway placement if required - damage to eyes, teeth, lips or other oral mucosa - nerve damage due to positioning  - sore throat or hoarseness - Damage to heart, brain, nerves, lungs, other parts of body or loss of life  Patient voiced understanding.)       Anesthesia Quick Evaluation

## 2023-01-12 ENCOUNTER — Encounter: Payer: Self-pay | Admitting: Gastroenterology

## 2023-01-25 ENCOUNTER — Ambulatory Visit (INDEPENDENT_AMBULATORY_CARE_PROVIDER_SITE_OTHER): Payer: Medicare Other

## 2023-01-25 VITALS — Ht 65.0 in | Wt 239.0 lb

## 2023-01-25 DIAGNOSIS — Z0000 Encounter for general adult medical examination without abnormal findings: Secondary | ICD-10-CM | POA: Diagnosis not present

## 2023-01-25 NOTE — Progress Notes (Signed)
I connected with  Nicole Sims on 01/25/23 by a audio enabled telemedicine application and verified that I am speaking with the correct person using two identifiers.  Patient Location: Home  Provider Location: Office/Clinic  I discussed the limitations of evaluation and management by telemedicine. The patient expressed understanding and agreed to proceed.  Subjective:   Nicole Sims is a 72 y.o. female who presents for Medicare Annual (Subsequent) preventive examination.  Review of Systems     Cardiac Risk Factors include: advanced age (>36men, >46 women);hypertension;dyslipidemia;sedentary lifestyle     Objective:    Today's Vitals   01/25/23 1032 01/25/23 1049  Weight:  239 lb (108.4 kg)  Height:  5\' 5"  (1.651 m)  PainSc: 4     Body mass index is 39.77 kg/m.     01/25/2023   10:38 AM 10/28/2022    2:51 PM 07/22/2022    2:47 PM 01/21/2022    2:50 PM 09/28/2021    5:27 PM 05/12/2021    7:41 AM 01/27/2021   10:52 AM  Advanced Directives  Does Patient Have a Medical Advance Directive? No Yes Yes Yes Yes Yes Yes  Type of Furniture conservator/restorer;Living will Healthcare Power of [place];Living will Healthcare Power of [place];Living will Healthcare Power of [employer];Living will Healthcare Power of [place];Living will Healthcare Power of [place];Living will  Does patient want to make changes to medical advance directive?    No - Patient declined   No - Patient declined  Copy of Healthcare Power of Attorney in Chart?  No - copy requested  No - copy requested  No - copy requested Yes - validated most recent copy scanned in chart (See row information)  Would patient like information on creating a medical advance directive? No - Patient declined          Current Medications (verified) Outpatient Encounter Medications as of 01/25/2023  Medication Sig   acetaminophen (TYLENOL) 500 MG tablet Take 500 mg by mouth every 6 (six) hours as needed.   albuterol  (VENTOLIN HFA) 108 (90 Base) MCG/ACT inhaler Inhale 2 puffs into the lungs every 4 (four) hours as needed for wheezing or shortness of breath.   anastrozole (ARIMIDEX) 1 MG tablet Take 1 tablet (1 mg total) by mouth daily.   aspirin 325 MG tablet Take 325 mg by mouth daily.   budesonide-formoterol (SYMBICORT) 80-4.5 MCG/ACT inhaler Inhale 2 puffs into the lungs 2 (two) times daily.   Evolocumab (REPATHA) 140 MG/ML SOSY INJECT 140 MG UNDER THE SKIN EVERY 14 DAYS   furosemide (LASIX) 20 MG tablet Take 1 tablet (20 mg) by mouth once daily as needed for weight gain of 3 lbs or more overnight   linaclotide (LINZESS) 290 MCG CAPS capsule Take 290 mcg by mouth daily before breakfast.   metoprolol succinate (TOPROL-XL) 100 MG 24 hr tablet Take 1 tablet (100 mg total) by mouth daily.   Omega-3 Fatty Acids (FISH OIL) 1000 MG CAPS Take 1,000 mg by mouth.   ondansetron (ZOFRAN) 4 MG tablet Take 1 tablet (4 mg total) by mouth 2 (two) times daily as needed for nausea or vomiting.   potassium chloride (KLOR-CON) 10 MEQ tablet Take 1 tablet (10 meq) by mouth once daily as needed only when taking lasix (furosemide)   pregabalin (LYRICA) 50 MG capsule Take by mouth. Take 1 capsule by mouth in the morning and 1 capsules at afternoon   RABEprazole (ACIPHEX) 20 MG tablet Take 20 mg by mouth 2 (  two) times daily.   sucralfate (CARAFATE) 1 g tablet Take 1 g by mouth daily as needed (GI sympotms).    venlafaxine XR (EFFEXOR-XR) 150 MG 24 hr capsule Take 1 capsule (150 mg total) by mouth daily with breakfast.   No facility-administered encounter medications on file as of 01/25/2023.    Allergies (verified) Levaquin [levofloxacin], Augmentin [amoxicillin-pot clavulanate], Bactrim [sulfamethoxazole-trimethoprim], Doxycycline, Hydrocodone-acetaminophen, Pseudoephedrine, and Shellfish allergy   History: Past Medical History:  Diagnosis Date   Anemia    Anginal pain (HCC)    Anxiety    Asthma    Cataract cortical,  senile    CHF (congestive heart failure) (HCC)    Chronic headaches    Diastolic heart failure (HCC)    Diverticulosis    Environmental allergies    GERD (gastroesophageal reflux disease)    Heart murmur    Hyperglycemia    Hyperlipidemia    Hypertension    Leukocytosis    Obesity    Osteoarthritis    Palpitations    Restless leg syndrome    Sleep apnea    Stroke (HCC)    Thrombocytosis    Urinary incontinence    mixed   Venous insufficiency    Vitamin D deficiency    Wears dentures    partial upper   Past Surgical History:  Procedure Laterality Date   BLADDER SURGERY     x2   [facility]   BREAST BIOPSY Left 06/24/2022   Stereo Bx, X-clip, neg   BREAST BIOPSY Left 06/24/2022   Stereo Bx, ribbon clip, atypical lobular hyperplasis   BREAST BIOPSY  06/24/2022   Stereo Bx, Coil Clip, neg   BREAST BIOPSY Left 06/24/2022   MM LT BREAST BX W LOC DEV 1ST LESION IMAGE BX SPEC STEREO GUIDE 06/24/2022 [facility]   BREAST BIOPSY Left 06/24/2022   MM LT BREAST BX W LOC DEV EA AD LESION IMG BX SPEC STEREO GUIDE 06/24/2022 [facility]   BREAST BIOPSY Left 06/24/2022   MM LT BREAST BX W LOC DEV EA AD LESION IMG BX SPEC STEREO GUIDE 06/24/2022 [facility]   BREAST CYST ASPIRATION Bilateral 2005   approximate year   CARDIAC CATHETERIZATION     [name]   CATARACT EXTRACTION W/PHACO Right 04/24/2020   Procedure: CATARACT EXTRACTION PHACO AND INTRAOCULAR LENS PLACEMENT (IOC) RIGHT;  Surgeon: Galen Manila, MD;  Location: [facility];  Service: Ophthalmology;  Laterality: Right;  6.75 0:37.3   CATARACT EXTRACTION W/PHACO Left 01/27/2021   Procedure: CATARACT EXTRACTION PHACO AND INTRAOCULAR LENS PLACEMENT (IOC) LEFT;  Surgeon: Galen Manila, MD;  Location: [facility];  Service: Ophthalmology;  Laterality: Left;  6.75 00:43.9   CERVICAL CONE BIOPSY     CIS   CHOLECYSTECTOMY     COLONOSCOPY WITH PROPOFOL N/A 07/19/2016   Procedure:  COLONOSCOPY WITH PROPOFOL;  Surgeon: Scot Jun, MD;  Location: [facility];  Service: Endoscopy;  Laterality: N/A;   COLONOSCOPY WITH PROPOFOL N/A 10/20/2018   Procedure: COLONOSCOPY WITH PROPOFOL;  Surgeon: Christena Deem, MD;  Location: [facility]COPY;  Service: Endoscopy;  Laterality: N/A;   COLONOSCOPY WITH PROPOFOL N/A 01/11/2023   Procedure: COLONOSCOPY WITH PROPOFOL;  Surgeon: Regis Bill, MD;  Location: [facility]COPY;  Service: Endoscopy;  Laterality: N/A;   ESOPHAGOGASTRODUODENOSCOPY N/A 05/12/2021   Procedure: ESOPHAGOGASTRODUODENOSCOPY (EGD);  Surgeon: Regis Bill, MD;  Location: [facility] END[facility]COPY;  Service: Endoscopy;  Laterality: N/A;   ESOPHAGOGASTRODUODENOSCOPY (EGD) WITH PROPOFOL N/A 07/19/2016   Procedure: ESOPHAGOGASTRODUODENOSCOPY (EGD) WITH PROPOFOL;  Surgeon: Scot Jun, MD;  Location: [facility] ENDOSCOPY;  Service: Endoscopy;  Laterality: N/A;   FLEXIBLE SIGMOIDOSCOPY     KNEE ARTHROSCOPY  08/13/2008   knee replacement and revision     left   RECTOCELE REPAIR     RIGHT/LEFT HEART CATH AND CORONARY ANGIOGRAPHY Bilateral 04/10/2018   Procedure: RIGHT/LEFT HEART CATH AND CORONARY ANGIOGRAPHY;  Surgeon: Iran Ouch, MD;  Location: [facility];  Service: Cardiovascular;  Laterality: Bilateral;   ROTATOR CUFF REPAIR     bilateral   SHOULDER SURGERY  11/17/2005   TONSILLECTOMY  1962   VAGINAL HYSTERECTOMY  1974   abnormal pap and carcinoma in situ   Family History  Problem Relation Age of Onset   Diabetes Mellitus II Father    Thyroid disease Father    Alzheimer's disease Father    Breast cancer Maternal Aunt    Hypertension Mother    Heart Problems Brother    Leukemia Maternal Aunt    Alzheimer's disease Paternal Aunt    Alzheimer's disease Paternal Uncle    Alzheimer's disease Paternal Uncle    Alzheimer's disease Paternal Uncle    Alzheimer's disease Paternal Aunt    Colon cancer Neg Hx    Kidney cancer Neg Hx     Bladder Cancer Neg Hx    Social History   Socioeconomic History   Marital status: Married    Spouse name: Not on file   Number of children: Not on file   Years of education: Not on file   Highest education level: Not on file  Occupational History   Occupation: retired  Tobacco Use   Smoking status: Former    Packs/day: 1.00    Years: 15.00    Additional pack years: 0.00    Total pack years: 15.00    Types: Cigarettes    Quit date: 08/17/1983    Years since quitting: 39.4   Smokeless tobacco: Never  Vaping Use   Vaping Use: Never used  Substance and Sexual Activity   Alcohol use: Yes    Alcohol/week: 0.0 standard drinks of alcohol    Comment: rarely   Drug use: No   Sexual activity: Not on file  Other Topics Concern   Not on file  Social History Narrative   Lives at home with husband   Social Determinants of Health   Financial Resource Strain: Low Risk  (01/25/2023)   Overall Financial Resource Strain (CARDIA)    Difficulty of Paying Living Expenses: Not hard at all  Food Insecurity: No Food Insecurity (01/25/2023)   Hunger Vital Sign    Worried About Running Out of Food in the Last Year: Never true    Ran Out of Food in the Last Year: Never true  Transportation Needs: No Transportation Needs (01/25/2023)   PRAPARE - Administrator, Civil Service (Medical): No    Lack of Transportation (Non-Medical): No  Physical Activity: Inactive (01/25/2023)   Exercise Vital Sign    Days of Exercise per Week: 0 days    Minutes of Exercise per Session: 0 min  Stress: No Stress Concern Present (01/25/2023)   [employer] of Occupational Health - Occupational Stress Questionnaire    Feeling of Stress : Not at all  Social Connections: Moderately Integrated (01/25/2023)   Social Connection and Isolation Panel [NHANES]    Frequency of Communication with Friends and Family: More than three times a week    Frequency of Social Gatherings with Friends and Family: Twice  a week     Attends Religious Services: More than 4 times per year    Active Member of Clubs or Organizations: No    Attends Banker Meetings: Never    Marital Status: Married    Tobacco Counseling Counseling given: Not Answered   Clinical Intake:  Pre-visit preparation completed: Yes  Pain : 0-10 Pain Score: 4  Pain Type: Chronic pain Pain Location: Knee Pain Orientation: Left     Nutritional Risks: None Diabetes: No  How often do you need to have someone help you when you read instructions, pamphlets, or other written materials from your doctor or pharmacy?: 1 - Never  Diabetic?no  Interpreter Needed?: No  Information entered by :: Kennedy Bucker, LPN   Activities of Daily Living    01/25/2023   10:40 AM 01/24/2023    9:55 AM  In your present state of health, do you have any difficulty performing the following activities:  Hearing? 0 0  Vision? 0 0  Difficulty concentrating or making decisions? 1 1  Walking or climbing stairs? 0 0  Dressing or bathing? 0 0  Doing errands, shopping? 0 0  Preparing Food and eating ? N N  Using the Toilet? N N  In the past six months, have you accidently leaked urine? Y Y  Do you have problems with loss of bowel control? N N  Managing your Medications? N N  Managing your Finances? N N  Housekeeping or managing your Housekeeping? N N    Patient Care Team: Dale [place], MD as PCP - General (Internal Medicine) Iran Ouch, MD as PCP - Cardiology (Cardiology)  Indicate any recent Medical Services you may have received from other than [facility] providers in the past year (date may be approximate).     Assessment:   This is a routine wellness examination for [place].  Hearing/Vision screen Hearing Screening - Comments:: No aids Vision Screening - Comments:: Wears glasses- Dr.Porfilio  Dietary issues and exercise activities discussed: Current Exercise Habits: The patient does not participate in regular exercise at  present   Goals Addressed             This Visit's Progress    DIET - EAT MORE FRUITS AND VEGETABLES         Depression Screen    01/25/2023   10:36 AM 10/05/2022    1:04 PM 09/09/2022    4:20 PM 06/03/2022    3:12 PM 03/23/2022   12:07 PM 03/02/2022    1:40 PM 01/21/2022    2:48 PM  PHQ 2/9 Scores  PHQ - 2 Score 0 0 0 0 0 0 0  PHQ- 9 Score 0          Fall Risk    01/25/2023   10:39 AM 01/24/2023    9:55 AM 10/05/2022    1:03 PM 09/09/2022    4:19 PM 06/03/2022    3:11 PM  Fall Risk   Falls in the past year? 1 1 0 0 1  Number falls in past yr: 0 0 0 0 0  Injury with Fall? 0 0 0 0 0  Risk for fall due to : History of fall(s)  No Fall Risks No Fall Risks No Fall Risks  Follow up Falls prevention discussed;Falls evaluation completed  Falls evaluation completed Falls evaluation completed Falls evaluation completed    FALL RISK PREVENTION PERTAINING TO THE HOME:  Any stairs in or around the home? Yes  If so, are there any  without handrails? No  Home free of loose throw rugs in walkways, pet beds, electrical cords, etc? Yes  Adequate lighting in your home to reduce risk of falls? Yes   ASSISTIVE DEVICES UTILIZED TO PREVENT FALLS:  Life alert? No  Use of a cane, walker or w/c? Yes - cane occasionally Grab bars in the bathroom? Yes  Shower chair or bench in shower? No  Elevated toilet seat or a handicapped toilet? No   Cognitive Function:        01/25/2023   10:45 AM 01/21/2022    2:59 PM  6CIT Screen  What Year? 0 points 0 points  What month? 0 points 0 points  What time? 0 points 0 points  Count back from 20 0 points 0 points  Months in reverse 2 points 0 points  Repeat phrase 2 points   Total Score 4 points     Immunizations Immunization History  Administered Date(s) Administered   Fluad Quad(high Dose 65+) 06/26/2019, 07/08/2020, 05/18/2021, 06/03/2022   Influenza, High Dose Seasonal PF 09/30/2017   Influenza,inj,Quad PF,6+ Mos 06/18/2013    Influenza-Unspecified 04/30/2014, 06/21/2016   PFIZER(Purple Top)SARS-COV-2 Vaccination 12/19/2019, 01/16/2020, 12/28/2020   Pneumococcal Polysaccharide-23 06/26/2019   Zoster Recombinat (Shingrix) 12/28/2020    TDAP status: Due, Education has been provided regarding the importance of this vaccine. Advised may receive this vaccine at local pharmacy or Health Dept. Aware to provide a copy of the vaccination record if obtained from local pharmacy or Health Dept. Verbalized acceptance and understanding.  Flu Vaccine status: Up to date  Pneumococcal vaccine status: Up to date  Covid-19 vaccine status: Completed vaccines  Qualifies for Shingles Vaccine? Yes   Zostavax completed No   Shingrix Completed?: No.    Education has been provided regarding the importance of this vaccine. Patient has been advised to call insurance company to determine out of pocket expense if they have not yet received this vaccine. Advised may also receive vaccine at local pharmacy or Health Dept. Verbalized acceptance and understanding.  Screening Tests Health Maintenance  Topic Date Due   DTaP/Tdap/Td (1 - Tdap) Never done   Zoster Vaccines- Shingrix (2 of 2) 02/22/2021   COVID-19 Vaccine (4 - 2023-24 season) 04/16/2022   Pneumonia Vaccine 57+ Years old (2 of 2 - PCV) 10/06/2023 (Originally 06/25/2020)   INFLUENZA VACCINE  03/17/2023   MAMMOGRAM  12/24/2023   Medicare Annual Wellness (AWV)  01/25/2024   Colonoscopy  01/10/2033   DEXA SCAN  Completed   Hepatitis C Screening  Completed   HPV VACCINES  Aged Out    Health Maintenance  Health Maintenance Due  Topic Date Due   DTaP/Tdap/Td (1 - Tdap) Never done   Zoster Vaccines- Shingrix (2 of 2) 02/22/2021   COVID-19 Vaccine (4 - 2023-24 season) 04/16/2022    Colorectal cancer screening: Type of screening: Colonoscopy. Completed 01/11/23. Repeat every 1 years  Mammogram status: Completed SCHEDULED 06/28/23. Repeat every year  Bone Density status:  Completed 09/28/22. Results reflect: Bone density results: OSTEOPENIA. Repeat every 5 years.  Lung Cancer Screening: (Low Dose CT Chest recommended if Age 74-80 years, 30 pack-year currently smoking OR have quit w/in 15years.) does not qualify.    Additional Screening:  Hepatitis C Screening: does qualify; Completed 09/11/15  Vision Screening: Recommended annual ophthalmology exams for early detection of glaucoma and other disorders of the eye. Is the patient up to date with their annual eye exam?  Yes  Who is the provider or what is the name of  the office in which the patient attends annual eye exams? Dr.Porfilio If pt is not established with a provider, would they like to be referred to a provider to establish care? No .   Dental Screening: Recommended annual dental exams for proper oral hygiene  Community Resource Referral / Chronic Care Management: CRR required this visit?  No   CCM required this visit?  No      Plan:     I have personally reviewed and noted the following in the patient's chart:   Medical and social history Use of alcohol, tobacco or illicit drugs  Current medications and supplements including opioid prescriptions. Patient is not currently taking opioid prescriptions. Functional ability and status Nutritional status Physical activity Advanced directives List of other physicians Hospitalizations, surgeries, and ER visits in previous 12 months Vitals Screenings to include cognitive, depression, and falls Referrals and appointments  In addition, I have reviewed and discussed with patient certain preventive protocols, quality metrics, and best practice recommendations. A written personalized care plan for preventive services as well as general preventive health recommendations were provided to patient.     Hal Hope, LPN   1/61/0960   Nurse Notes: none

## 2023-01-25 NOTE — Patient Instructions (Signed)
Nicole Sims , Thank you for taking time to come for your Medicare Wellness Visit. I appreciate your ongoing commitment to your health goals. Please review the following plan we discussed and let me know if I can assist you in the future.   These are the goals we discussed:  Goals      DIET - EAT MORE FRUITS AND VEGETABLES     DIET - REDUCE SUGAR INTAKE        This is a list of the screening recommended for you and due dates:  Health Maintenance  Topic Date Due   DTaP/Tdap/Td vaccine (1 - Tdap) Never done   Zoster (Shingles) Vaccine (2 of 2) 02/22/2021   COVID-19 Vaccine (4 - 2023-24 season) 04/16/2022   Pneumonia Vaccine (2 of 2 - PCV) 10/06/2023*   Flu Shot  03/17/2023   Mammogram  12/24/2023   Medicare Annual Wellness Visit  01/25/2024   Colon Cancer Screening  01/10/2033   DEXA scan (bone density measurement)  Completed   Hepatitis C Screening  Completed   HPV Vaccine  Aged Out  *Topic was postponed. The date shown is not the original due date.    Advanced directives: no  Conditions/risks identified: none  Next appointment: Follow up in one year for your annual wellness visit 01/27/24 @ 10:15 am by phone   Preventive Care 65 Years and Older, Female Preventive care refers to lifestyle choices and visits with your health care provider that can promote health and wellness. What does preventive care include? A yearly physical exam. This is also called an annual well check. Dental exams once or twice a year. Routine eye exams. Ask your health care provider how often you should have your eyes checked. Personal lifestyle choices, including: Daily care of your teeth and gums. Regular physical activity. Eating a healthy diet. Avoiding tobacco and drug use. Limiting alcohol use. Practicing safe sex. Taking low-dose aspirin every day. Taking vitamin and mineral supplements as recommended by your health care provider. What happens during an annual well check? The services and  screenings done by your health care provider during your annual well check will depend on your age, overall health, lifestyle risk factors, and family history of disease. Counseling  Your health care provider may ask you questions about your: Alcohol use. Tobacco use. Drug use. Emotional well-being. Home and relationship well-being. Sexual activity. Eating habits. History of falls. Memory and ability to understand (cognition). Work and work Astronomer. Reproductive health. Screening  You may have the following tests or measurements: Height, weight, and BMI. Blood pressure. Lipid and cholesterol levels. These may be checked every 5 years, or more frequently if you are over 62 years old. Skin check. Lung cancer screening. You may have this screening every year starting at age 63 if you have a 30-pack-year history of smoking and currently smoke or have quit within the past 15 years. Fecal occult blood test (FOBT) of the stool. You may have this test every year starting at age 25. Flexible sigmoidoscopy or colonoscopy. You may have a sigmoidoscopy every 5 years or a colonoscopy every 10 years starting at age 90. Hepatitis C blood test. Hepatitis B blood test. Sexually transmitted disease (STD) testing. Diabetes screening. This is done by checking your blood sugar (glucose) after you have not eaten for a while (fasting). You may have this done every 1-3 years. Bone density scan. This is done to screen for osteoporosis. You may have this done starting at age 43. Mammogram. This may  be done every 1-2 years. Talk to your health care provider about how often you should have regular mammograms. Talk with your health care provider about your test results, treatment options, and if necessary, the need for more tests. Vaccines  Your health care provider may recommend certain vaccines, such as: Influenza vaccine. This is recommended every year. Tetanus, diphtheria, and acellular pertussis (Tdap,  Td) vaccine. You may need a Td booster every 10 years. Zoster vaccine. You may need this after age 97. Pneumococcal 13-valent conjugate (PCV13) vaccine. One dose is recommended after age 50. Pneumococcal polysaccharide (PPSV23) vaccine. One dose is recommended after age 31. Talk to your health care provider about which screenings and vaccines you need and how often you need them. This information is not intended to replace advice given to you by your health care provider. Make sure you discuss any questions you have with your health care provider. Document Released: 08/29/2015 Document Revised: 04/21/2016 Document Reviewed: 06/03/2015 Elsevier Interactive Patient Education  2017 [employer] Prevention in the Home Falls can cause injuries. They can happen to people of all ages. There are many things you can do to make your home safe and to help prevent falls. What can I do on the outside of my home? Regularly fix the edges of walkways and driveways and fix any cracks. Remove anything that might make you trip as you walk through a door, such as a raised step or threshold. Trim any bushes or trees on the path to your home. Use bright outdoor lighting. Clear any walking paths of anything that might make someone trip, such as rocks or tools. Regularly check to see if handrails are loose or broken. Make sure that both sides of any steps have handrails. Any raised decks and porches should have guardrails on the edges. Have any leaves, snow, or ice cleared regularly. Use sand or salt on walking paths during winter. Clean up any spills in your garage right away. This includes oil or grease spills. What can I do in the bathroom? Use night lights. Install grab bars by the toilet and in the tub and shower. Do not use towel bars as grab bars. Use non-skid mats or decals in the tub or shower. If you need to sit down in the shower, use a plastic, non-slip stool. Keep the floor dry. Clean up any  water that spills on the floor as soon as it happens. Remove soap buildup in the tub or shower regularly. Attach bath mats securely with double-sided non-slip rug tape. Do not have throw rugs and other things on the floor that can make you trip. What can I do in the bedroom? Use night lights. Make sure that you have a light by your bed that is easy to reach. Do not use any sheets or blankets that are too big for your bed. They should not hang down onto the floor. Have a firm chair that has side arms. You can use this for support while you get dressed. Do not have throw rugs and other things on the floor that can make you trip. What can I do in the kitchen? Clean up any spills right away. Avoid walking on wet floors. Keep items that you use a lot in easy-to-reach places. If you need to reach something above you, use a strong step stool that has a grab bar. Keep electrical cords out of the way. Do not use floor polish or wax that makes floors slippery. If you must use  wax, use non-skid floor wax. Do not have throw rugs and other things on the floor that can make you trip. What can I do with my stairs? Do not leave any items on the stairs. Make sure that there are handrails on both sides of the stairs and use them. Fix handrails that are broken or loose. Make sure that handrails are as long as the stairways. Check any carpeting to make sure that it is firmly attached to the stairs. Fix any carpet that is loose or worn. Avoid having throw rugs at the top or bottom of the stairs. If you do have throw rugs, attach them to the floor with carpet tape. Make sure that you have a light switch at the top of the stairs and the bottom of the stairs. If you do not have them, ask someone to add them for you. What else can I do to help prevent falls? Wear shoes that: Do not have high heels. Have rubber bottoms. Are comfortable and fit you well. Are closed at the toe. Do not wear sandals. If you use a  stepladder: Make sure that it is fully opened. Do not climb a closed stepladder. Make sure that both sides of the stepladder are locked into place. Ask someone to hold it for you, if possible. Clearly mark and make sure that you can see: Any grab bars or handrails. First and last steps. Where the edge of each step is. Use tools that help you move around (mobility aids) if they are needed. These include: Canes. Walkers. Scooters. Crutches. Turn on the lights when you go into a dark area. Replace any light bulbs as soon as they burn out. Set up your furniture so you have a clear path. Avoid moving your furniture around. If any of your floors are uneven, fix them. If there are any pets around you, be aware of where they are. Review your medicines with your doctor. Some medicines can make you feel dizzy. This can increase your chance of falling. Ask your doctor what other things that you can do to help prevent falls. This information is not intended to replace advice given to you by your health care provider. Make sure you discuss any questions you have with your health care provider. Document Released: 05/29/2009 Document Revised: 01/08/2016 Document Reviewed: 09/06/2014 Elsevier Interactive Patient Education  2017 [employer].

## 2023-01-31 ENCOUNTER — Other Ambulatory Visit (INDEPENDENT_AMBULATORY_CARE_PROVIDER_SITE_OTHER): Payer: Medicare Other

## 2023-01-31 DIAGNOSIS — E7800 Pure hypercholesterolemia, unspecified: Secondary | ICD-10-CM | POA: Diagnosis not present

## 2023-01-31 DIAGNOSIS — R739 Hyperglycemia, unspecified: Secondary | ICD-10-CM

## 2023-01-31 DIAGNOSIS — I10 Essential (primary) hypertension: Secondary | ICD-10-CM

## 2023-01-31 LAB — HEPATIC FUNCTION PANEL
ALT: 10 U/L (ref 0–35)
AST: 13 U/L (ref 0–37)
Albumin: 4 g/dL (ref 3.5–5.2)
Alkaline Phosphatase: 90 U/L (ref 39–117)
Bilirubin, Direct: 0.1 mg/dL (ref 0.0–0.3)
Total Bilirubin: 0.5 mg/dL (ref 0.2–1.2)
Total Protein: 8 g/dL (ref 6.0–8.3)

## 2023-01-31 LAB — BASIC METABOLIC PANEL
BUN: 19 mg/dL (ref 6–23)
CO2: 24 mEq/L (ref 19–32)
Calcium: 9.4 mg/dL (ref 8.4–10.5)
Chloride: 107 mEq/L (ref 96–112)
Creatinine, Ser: 1.07 mg/dL (ref 0.40–1.20)
GFR: 51.79 mL/min — ABNORMAL LOW (ref >60.00)
Glucose, Bld: 97 mg/dL (ref 70–99)
Potassium: 4.3 mEq/L (ref 3.5–5.1)
Sodium: 140 mEq/L (ref 135–145)

## 2023-01-31 LAB — LIPID PANEL
Cholesterol: 144 mg/dL (ref 0–200)
HDL: 52.5 mg/dL (ref >39.00)
LDL Cholesterol: 75 mg/dL (ref 0–99)
NonHDL: 91.22
Total CHOL/HDL Ratio: 3
Triglycerides: 82 mg/dL (ref 0.0–149.0)
VLDL: 16.4 mg/dL (ref 0.0–40.0)

## 2023-01-31 LAB — HEMOGLOBIN A1C: Hgb A1c MFr Bld: 5.8 % (ref 4.6–6.5)

## 2023-02-01 ENCOUNTER — Encounter: Payer: Self-pay | Admitting: *Deleted

## 2023-02-03 ENCOUNTER — Ambulatory Visit: Payer: Medicare Other | Admitting: Internal Medicine

## 2023-02-11 ENCOUNTER — Ambulatory Visit (INDEPENDENT_AMBULATORY_CARE_PROVIDER_SITE_OTHER): Payer: Medicare Other | Admitting: Internal Medicine

## 2023-02-11 VITALS — BP 126/76 | HR 83 | Temp 97.9°F | Resp 16 | Ht 65.0 in | Wt 240.0 lb

## 2023-02-11 DIAGNOSIS — I700 Atherosclerosis of aorta: Secondary | ICD-10-CM | POA: Diagnosis not present

## 2023-02-11 DIAGNOSIS — R739 Hyperglycemia, unspecified: Secondary | ICD-10-CM | POA: Diagnosis not present

## 2023-02-11 DIAGNOSIS — K219 Gastro-esophageal reflux disease without esophagitis: Secondary | ICD-10-CM

## 2023-02-11 DIAGNOSIS — R928 Other abnormal and inconclusive findings on diagnostic imaging of breast: Secondary | ICD-10-CM | POA: Diagnosis not present

## 2023-02-11 DIAGNOSIS — D473 Essential (hemorrhagic) thrombocythemia: Secondary | ICD-10-CM

## 2023-02-11 DIAGNOSIS — N6099 Unspecified benign mammary dysplasia of unspecified breast: Secondary | ICD-10-CM

## 2023-02-11 DIAGNOSIS — I5033 Acute on chronic diastolic (congestive) heart failure: Secondary | ICD-10-CM | POA: Diagnosis not present

## 2023-02-11 DIAGNOSIS — I10 Essential (primary) hypertension: Secondary | ICD-10-CM | POA: Diagnosis not present

## 2023-02-11 DIAGNOSIS — G720 Drug-induced myopathy: Secondary | ICD-10-CM

## 2023-02-11 DIAGNOSIS — E7800 Pure hypercholesterolemia, unspecified: Secondary | ICD-10-CM | POA: Diagnosis not present

## 2023-02-11 DIAGNOSIS — M545 Low back pain, unspecified: Secondary | ICD-10-CM | POA: Diagnosis not present

## 2023-02-11 DIAGNOSIS — I5030 Unspecified diastolic (congestive) heart failure: Secondary | ICD-10-CM

## 2023-02-11 DIAGNOSIS — I471 Supraventricular tachycardia, unspecified: Secondary | ICD-10-CM | POA: Diagnosis not present

## 2023-02-11 DIAGNOSIS — F439 Reaction to severe stress, unspecified: Secondary | ICD-10-CM

## 2023-02-11 DIAGNOSIS — L900 Lichen sclerosus et atrophicus: Secondary | ICD-10-CM

## 2023-02-11 NOTE — Progress Notes (Unsigned)
Subjective:    Patient ID: Nicole Sims, female    DOB: 07-16-1951, 72 y.o.   MRN: [id number]  Patient here for  Chief Complaint  Patient presents with   Medical Management of Chronic Issues    HPI Here to follow up regarding hypercholesterolemia and hypertension. Had colonoscopy 01/11/23 - fair  prep.  Recommended f/u colonoscopy in 2 years.  Had f/u with Dr Maia Plan - f/u atypical lobular hyperplasia of both breasts. Mammogram 12/24/22 -  Recommended 6 month diagnostic mammogram.  Seeing oncology - on anastrozole. Had f/u with Dr Kirke Corin 12/28/22 - paroxysmal SVT - continue toprol 100mg  q day.  F/u chronic diastolic heart failure.  Continue lasix prn. Continues on repatha. F/u physiatry - 12/23/22 - back pain/shoulder pain and neck pain.  Recommended to continue tylenol and exercise.  Continue lyrica.  Continues to have low back pain.  Discussed exercise and stretches. Has planned f/u with [facility].  Breathing stable.  No increased cough.  Some clearing of her throat.  Has seen ENT.  Taking zyrtec daily.  No abdominal pain reported.    Past Medical History:  Diagnosis Date   Anemia    Anginal pain (HCC)    Anxiety    Asthma    Cataract cortical, senile    CHF (congestive heart failure) (HCC)    Chronic headaches    Diastolic heart failure (HCC)    Diverticulosis    Environmental allergies    GERD (gastroesophageal reflux disease)    Heart murmur    Hyperglycemia    Hyperlipidemia    Hypertension    Leukocytosis    Obesity    Osteoarthritis    Palpitations    Restless leg syndrome    Sleep apnea    Stroke (HCC)    Thrombocytosis    Urinary incontinence    mixed   Venous insufficiency    Vitamin D deficiency    Wears dentures    partial upper   Past Surgical History:  Procedure Laterality Date   BLADDER SURGERY     x2   [facility]   BREAST BIOPSY Left 06/24/2022   Stereo Bx, X-clip, neg   BREAST BIOPSY Left 06/24/2022   Stereo Bx, ribbon clip, atypical  lobular hyperplasis   BREAST BIOPSY  06/24/2022   Stereo Bx, Coil Clip, neg   BREAST BIOPSY Left 06/24/2022   MM LT BREAST BX W LOC DEV 1ST LESION IMAGE BX SPEC STEREO GUIDE 06/24/2022 [facility]   BREAST BIOPSY Left 06/24/2022   MM LT BREAST BX W LOC DEV EA AD LESION IMG BX SPEC STEREO GUIDE 06/24/2022 [facility]   BREAST BIOPSY Left 06/24/2022   MM LT BREAST BX W LOC DEV EA AD LESION IMG BX SPEC STEREO GUIDE 06/24/2022 [facility]   BREAST CYST ASPIRATION Bilateral 2005   approximate year   CARDIAC CATHETERIZATION     [name]   CATARACT EXTRACTION W/PHACO Right 04/24/2020   Procedure: CATARACT EXTRACTION PHACO AND INTRAOCULAR LENS PLACEMENT (IOC) RIGHT;  Surgeon: Galen Manila, MD;  Location: [facility];  Service: Ophthalmology;  Laterality: Right;  6.75 0:37.3   CATARACT EXTRACTION W/PHACO Left 01/27/2021   Procedure: CATARACT EXTRACTION PHACO AND INTRAOCULAR LENS PLACEMENT (IOC) LEFT;  Surgeon: Galen Manila, MD;  Location: [facility];  Service: Ophthalmology;  Laterality: Left;  6.75 00:43.9   CERVICAL CONE BIOPSY     CIS   CHOLECYSTECTOMY     COLONOSCOPY WITH PROPOFOL N/A 07/19/2016   Procedure: COLONOSCOPY WITH PROPOFOL;  Surgeon: Scot Jun, MD;  Location: [facility];  Service: Endoscopy;  Laterality: N/A;   COLONOSCOPY WITH PROPOFOL N/A 10/20/2018   Procedure: COLONOSCOPY WITH PROPOFOL;  Surgeon: Christena Deem, MD;  Location: [facility];  Service: Endoscopy;  Laterality: N/A;   COLONOSCOPY WITH PROPOFOL N/A 01/11/2023   Procedure: COLONOSCOPY WITH PROPOFOL;  Surgeon: Regis Bill, MD;  Location: [facility]COPY;  Service: Endoscopy;  Laterality: N/A;   ESOPHAGOGASTRODUODENOSCOPY N/A 05/12/2021   Procedure: ESOPHAGOGASTRODUODENOSCOPY (EGD);  Surgeon: Regis Bill, MD;  Location: [facility]COPY;  Service: Endoscopy;  Laterality: N/A;   ESOPHAGOGASTRODUODENOSCOPY (EGD) WITH PROPOFOL N/A 07/19/2016    Procedure: ESOPHAGOGASTRODUODENOSCOPY (EGD) WITH PROPOFOL;  Surgeon: Scot Jun, MD;  Location: [facility] END[facility]COPY;  Service: Endoscopy;  Laterality: N/A;   FLEXIBLE SIGMOIDOSCOPY     KNEE ARTHROSCOPY  08/13/2008   knee replacement and revision     left   RECTOCELE REPAIR     RIGHT/LEFT HEART CATH AND CORONARY ANGIOGRAPHY Bilateral 04/10/2018   Procedure: RIGHT/LEFT HEART CATH AND CORONARY ANGIOGRAPHY;  Surgeon: Iran Ouch, MD;  Location: [facility];  Service: Cardiovascular;  Laterality: Bilateral;   ROTATOR CUFF REPAIR     bilateral   SHOULDER SURGERY  11/17/2005   TONSILLECTOMY  1962   VAGINAL HYSTERECTOMY  1974   abnormal pap and carcinoma in situ   Family History  Problem Relation Age of Onset   Diabetes Mellitus II Father    Thyroid disease Father    Alzheimer's disease Father    Breast cancer Maternal Aunt    Hypertension Mother    Heart Problems Brother    Leukemia Maternal Aunt    Alzheimer's disease Paternal Aunt    Alzheimer's disease Paternal Uncle    Alzheimer's disease Paternal Uncle    Alzheimer's disease Paternal Uncle    Alzheimer's disease Paternal Aunt    Colon cancer Neg Hx    Kidney cancer Neg Hx    Bladder Cancer Neg Hx    Social History   Socioeconomic History   Marital status: Married    Spouse name: Not on file   Number of children: Not on file   Years of education: Not on file   Highest education level: 12th grade  Occupational History   Occupation: retired  Tobacco Use   Smoking status: Former    Packs/day: 1.00    Years: 15.00    Additional pack years: 0.00    Total pack years: 15.00    Types: Cigarettes    Quit date: 08/17/1983    Years since quitting: 39.5   Smokeless tobacco: Never  Vaping Use   Vaping Use: Never used  Substance and Sexual Activity   Alcohol use: Yes    Alcohol/week: 0.0 standard drinks of alcohol    Comment: rarely   Drug use: No   Sexual activity: Not on file  Other Topics Concern   Not on  file  Social History Narrative   Lives at home with husband   Social Determinants of Health   Financial Resource Strain: Low Risk  (02/08/2023)   Overall Financial Resource Strain (CARDIA)    Difficulty of Paying Living Expenses: Not hard at all  Food Insecurity: No Food Insecurity (02/08/2023)   Hunger Vital Sign    Worried About Running Out of Food in the Last Year: Never true    Ran Out of Food in the Last Year: Never true  Transportation Needs: No Transportation Needs (02/08/2023)   PRAPARE - Transportation  Lack of Transportation (Medical): No    Lack of Transportation (Non-Medical): No  Physical Activity: Inactive (02/08/2023)   Exercise Vital Sign    Days of Exercise per Week: 0 days    Minutes of Exercise per Session: 0 min  Stress: No Stress Concern Present (02/08/2023)   [employer] of Occupational Health - Occupational Stress Questionnaire    Feeling of Stress : Only a little  Social Connections: Moderately Integrated (02/08/2023)   Social Connection and Isolation Panel [NHANES]    Frequency of Communication with Friends and Family: More than three times a week    Frequency of Social Gatherings with Friends and Family: Once a week    Attends Religious Services: More than 4 times per year    Active Member of [employer] or Organizations: No    Attends Engineer, structural: Never    Marital Status: Married     Review of Systems     Objective:     BP 126/76   Pulse 83   Temp 97.9 F (36.6 C)   Resp 16   Ht 5\' 5"  (1.651 m)   Wt 240 lb (108.9 kg)   SpO2 98%   BMI 39.94 kg/m  Wt Readings from Last 3 Encounters:  02/11/23 240 lb (108.9 kg)  01/25/23 239 lb (108.4 kg)  01/11/23 235 lb (106.6 kg)    Physical Exam   Outpatient Encounter Medications as of 02/11/2023  Medication Sig   acetaminophen (TYLENOL) 500 MG tablet Take 500 mg by mouth every 6 (six) hours as needed.   albuterol (VENTOLIN HFA) 108 (90 Base) MCG/ACT inhaler Inhale 2 puffs into  the lungs every 4 (four) hours as needed for wheezing or shortness of breath.   anastrozole (ARIMIDEX) 1 MG tablet Take 1 tablet (1 mg total) by mouth daily.   aspirin 325 MG tablet Take 325 mg by mouth daily.   budesonide-formoterol (SYMBICORT) 80-4.5 MCG/ACT inhaler Inhale 2 puffs into the lungs 2 (two) times daily.   Evolocumab (REPATHA) 140 MG/ML SOSY INJECT 140 MG UNDER THE SKIN EVERY 14 DAYS   furosemide (LASIX) 20 MG tablet Take 1 tablet (20 mg) by mouth once daily as needed for weight gain of 3 lbs or more overnight   linaclotide (LINZESS) 290 MCG CAPS capsule Take 290 mcg by mouth daily before breakfast.   metoprolol succinate (TOPROL-XL) 100 MG 24 hr tablet Take 1 tablet (100 mg total) by mouth daily.   Omega-3 Fatty Acids (FISH OIL) 1000 MG CAPS Take 1,000 mg by mouth.   ondansetron (ZOFRAN) 4 MG tablet Take 1 tablet (4 mg total) by mouth 2 (two) times daily as needed for nausea or vomiting.   potassium chloride (KLOR-CON) 10 MEQ tablet Take 1 tablet (10 meq) by mouth once daily as needed only when taking lasix (furosemide)   pregabalin (LYRICA) 50 MG capsule Take by mouth. Take 1 capsule by mouth in the morning and 1 capsules at afternoon   RABEprazole (ACIPHEX) 20 MG tablet Take 20 mg by mouth 2 (two) times daily.   sucralfate (CARAFATE) 1 g tablet Take 1 g by mouth daily as needed (GI sympotms).    venlafaxine XR (EFFEXOR-XR) 150 MG 24 hr capsule Take 1 capsule (150 mg total) by mouth daily with breakfast.   No facility-administered encounter medications on file as of 02/11/2023.     Lab Results  Component Value Date   WBC 7.8 10/01/2022   HGB 13.2 10/01/2022   HCT 40.4 10/01/2022   PLT  444.0 (H) 10/01/2022   GLUCOSE 97 01/31/2023   CHOL 144 01/31/2023   TRIG 82.0 01/31/2023   HDL 52.50 01/31/2023   LDLDIRECT 172.8 09/17/2013   LDLCALC 75 01/31/2023   ALT 10 01/31/2023   AST 13 01/31/2023   NA 140 01/31/2023   K 4.3 01/31/2023   CL 107 01/31/2023   CREATININE 1.07  01/31/2023   BUN 19 01/31/2023   CO2 24 01/31/2023   TSH 2.56 02/25/2022   INR 1.0 06/11/2019   HGBA1C 5.8 01/31/2023   MICROALBUR 1.1 04/28/2015    No results found.     Assessment & Plan:  Hypercholesteremia -     Lipid panel; Future -     Hepatic function panel; Future  Hyperglycemia -     Hemoglobin A1c; Future  Essential hypertension -     Basic metabolic panel; Future -     TSH; Future     Dale Midtown, MD

## 2023-02-12 ENCOUNTER — Encounter: Payer: Self-pay | Admitting: Internal Medicine

## 2023-02-12 DIAGNOSIS — M545 Low back pain, unspecified: Secondary | ICD-10-CM | POA: Insufficient documentation

## 2023-02-12 NOTE — Assessment & Plan Note (Signed)
Worked up previously by hematology.  Follow cbc.  

## 2023-02-12 NOTE — Assessment & Plan Note (Signed)
On repatha.  Continue.  

## 2023-02-12 NOTE — Assessment & Plan Note (Signed)
History of diastolic dysfunction.  EF 60-65%.  No increased sob reported.  Follow. Continue metoprolol.  

## 2023-02-12 NOTE — Assessment & Plan Note (Signed)
Has a history of PSVT.  Has seen Dr Arida.  On metoprolol.  No increased heart rate or palpitations reported.  Continue metoprolol.   

## 2023-02-12 NOTE — Assessment & Plan Note (Signed)
Had f/u with Dr Maia Plan - f/u atypical lobular hyperplasia of both breasts. Mammogram 12/24/22 -  Recommended 6 month diagnostic mammogram.  Seeing oncology - on anastrozole. Discussed medication.  She is concerned about tolerance to the medication.  Had questions about possible side effects.  Discussed need to notify oncology of concerns.

## 2023-02-12 NOTE — Assessment & Plan Note (Signed)
Saw Dr Finnegan 07/22/22 - anastrozole 1 mg daily for 5 years  

## 2023-02-12 NOTE — Assessment & Plan Note (Signed)
F/u physiatry - 12/23/22 - back pain/shoulder pain and neck pain.  Recommended to continue tylenol and exercise.  Continue lyrica.  Continues to have low back pain.  Discussed exercise and stretches. Has planned f/u with Whitney.

## 2023-02-12 NOTE — Assessment & Plan Note (Signed)
Saw gyn 05/2021.  PAP negative.  Needs to continue f/u with gyn.

## 2023-02-12 NOTE — Assessment & Plan Note (Signed)
Continue effexor. Appears to be stable.  

## 2023-02-12 NOTE — Assessment & Plan Note (Signed)
Continue aciphex.  

## 2023-02-12 NOTE — Assessment & Plan Note (Signed)
Repeat echo 11/07/21 showed LVEF 65-70%, no WMA, normal RV function. Had f/u with Dr Kirke Corin 12/28/22 - paroxysmal SVT - continue toprol 100mg  q day.  Stable. F/u chronic diastolic heart failure.  Continue lasix prn.

## 2023-02-12 NOTE — Assessment & Plan Note (Signed)
On repatha and doing well.  Follow.   

## 2023-02-12 NOTE — Assessment & Plan Note (Signed)
Previous joint aching.  On repatha now.  Off statin.  Follow.  

## 2023-02-12 NOTE — Assessment & Plan Note (Signed)
Continue toprol.  Blood pressure doing well.  Follow blood pressure.  Follow metabolic panel.  °

## 2023-02-22 DIAGNOSIS — M545 Low back pain, unspecified: Secondary | ICD-10-CM | POA: Diagnosis not present

## 2023-02-22 DIAGNOSIS — M47816 Spondylosis without myelopathy or radiculopathy, lumbar region: Secondary | ICD-10-CM | POA: Diagnosis not present

## 2023-02-22 DIAGNOSIS — M48062 Spinal stenosis, lumbar region with neurogenic claudication: Secondary | ICD-10-CM | POA: Diagnosis not present

## 2023-02-22 DIAGNOSIS — M5136 Other intervertebral disc degeneration, lumbar region: Secondary | ICD-10-CM | POA: Diagnosis not present

## 2023-02-22 DIAGNOSIS — M4726 Other spondylosis with radiculopathy, lumbar region: Secondary | ICD-10-CM | POA: Diagnosis not present

## 2023-02-22 DIAGNOSIS — M5116 Intervertebral disc disorders with radiculopathy, lumbar region: Secondary | ICD-10-CM | POA: Diagnosis not present

## 2023-03-07 ENCOUNTER — Telehealth: Payer: Self-pay | Admitting: Internal Medicine

## 2023-03-07 NOTE — Telephone Encounter (Signed)
Ok to refill symbicort.  Also can refill clobetasol x 2, but I do recommend f/u with gyn for continued f/u

## 2023-03-07 NOTE — Telephone Encounter (Signed)
Patient is requesting refill on her symbicort and clobetasol cream. She says she has not been using her symbicort regularly but needs to start again and the clobeatsol cream she says that you give it to her to use prn in the groin area/vaginal area. Ok to refill these prescriptions for her? Confirmed she is not having acute issues right now but would like to have the cream to keep on hand.

## 2023-03-07 NOTE — Telephone Encounter (Signed)
Prescription Request  03/07/2023  LOV: 02/11/2023  What is the name of the medication or equipment? budesonide-formoterol (SYMBICORT) 80-4.5 MCG/ACT inhaler and the vaginal cream. I did not see it on med list.  Have you contacted your pharmacy to request a refill? Yes   Which pharmacy would you like this sent to?   [facility]) [place], [place] - [address] [address] [facility] [address] [place] [address] [place]  [address] Phone: [phone] Fax: [phone]   Patient notified that their request is being sent to the clinical staff for review and that they should receive a response within 2 business days.   Please advise at Mobile [phone] (mobile)

## 2023-03-07 NOTE — Telephone Encounter (Signed)
Patient is requesting refill on her symbicort and clobetasol cream. She says she has not been using her symbicort but needs to start again and the clobeatsol cream she says that you give it to her to use prn in the groin

## 2023-03-08 ENCOUNTER — Other Ambulatory Visit: Payer: Self-pay

## 2023-03-08 DIAGNOSIS — Z760 Encounter for issue of repeat prescription: Secondary | ICD-10-CM

## 2023-03-08 MED ORDER — BUDESONIDE-FORMOTEROL FUMARATE 80-4.5 MCG/ACT IN AERO
2.0000 | INHALATION_SPRAY | Freq: Two times a day (BID) | RESPIRATORY_TRACT | 3 refills | Status: DC
Start: 2023-03-08 — End: 2023-03-29

## 2023-03-08 MED ORDER — CLOBETASOL PROPIONATE 0.05 % EX CREA: 1.0000 | TOPICAL_CREAM | Freq: Two times a day (BID) | CUTANEOUS | 2 refills | Status: DC

## 2023-03-08 NOTE — Telephone Encounter (Signed)
Medications refilled to [facility]. Patient is aware and says that she would prefer to discuss gyn with PCP at next appt.

## 2023-03-14 DIAGNOSIS — M5416 Radiculopathy, lumbar region: Secondary | ICD-10-CM | POA: Diagnosis not present

## 2023-03-14 DIAGNOSIS — M48062 Spinal stenosis, lumbar region with neurogenic claudication: Secondary | ICD-10-CM | POA: Diagnosis not present

## 2023-03-28 ENCOUNTER — Other Ambulatory Visit: Payer: Self-pay

## 2023-03-28 ENCOUNTER — Telehealth: Payer: Self-pay | Admitting: Internal Medicine

## 2023-03-28 ENCOUNTER — Other Ambulatory Visit (INDEPENDENT_AMBULATORY_CARE_PROVIDER_SITE_OTHER): Payer: Medicare Other

## 2023-03-28 DIAGNOSIS — R300 Dysuria: Secondary | ICD-10-CM

## 2023-03-28 NOTE — Telephone Encounter (Signed)
  Symptoms:     AttriPatient is having abdominal pain, urine smells, bladder is not emptying, and nausea buting factors (medication changes, positional changes, etc. )      Duration :  About 1 month   Pain Scale?  On 1-10 how woiuld you rate your pain? What makes it better or worse?      Blood pressure            Pulse             Temp

## 2023-03-28 NOTE — Telephone Encounter (Signed)
Spoke with patient. She has been having nausea, frequency, burning, some lower abd discomfort, feeling like bladder is not emptying x2 weeks. No fever, chills, vomiting, blood in urine, etc. Patient has been scheduled to come leave urine sample today and see Dr Lorin Picket tomorrow for virtual appt to discuss results.

## 2023-03-29 ENCOUNTER — Telehealth (INDEPENDENT_AMBULATORY_CARE_PROVIDER_SITE_OTHER): Payer: Medicare Other | Admitting: Internal Medicine

## 2023-03-29 VITALS — Ht 65.0 in | Wt 240.0 lb

## 2023-03-29 DIAGNOSIS — R300 Dysuria: Secondary | ICD-10-CM

## 2023-03-29 DIAGNOSIS — R110 Nausea: Secondary | ICD-10-CM

## 2023-03-29 DIAGNOSIS — I10 Essential (primary) hypertension: Secondary | ICD-10-CM

## 2023-03-29 DIAGNOSIS — Z760 Encounter for issue of repeat prescription: Secondary | ICD-10-CM

## 2023-03-29 MED ORDER — NITROFURANTOIN MONOHYD MACRO 100 MG PO CAPS: 100.0000 mg | ORAL_CAPSULE | Freq: Two times a day (BID) | ORAL | 0 refills | Status: DC

## 2023-03-29 MED ORDER — BUDESONIDE-FORMOTEROL FUMARATE 80-4.5 MCG/ACT IN AERO
2.0000 | INHALATION_SPRAY | Freq: Two times a day (BID) | RESPIRATORY_TRACT | 3 refills | Status: AC
Start: 2023-03-29 — End: ?

## 2023-03-29 MED ORDER — CLOBETASOL PROPIONATE 0.05 % EX CREA: 1.0000 | TOPICAL_CREAM | Freq: Two times a day (BID) | CUTANEOUS | 0 refills | Status: AC

## 2023-03-29 MED ORDER — ONDANSETRON HCL 4 MG PO TABS
4.0000 mg | ORAL_TABLET | Freq: Two times a day (BID) | ORAL | 0 refills | Status: AC | PRN
Start: 2023-03-29 — End: ?

## 2023-03-29 NOTE — Progress Notes (Signed)
Patient ID: Nicole Sims, female   DOB: 1951-03-30, 72 y.o.   MRN: [id number]   Virtual Visit via video Note  I connected with Nicole Sims by a video enabled telemedicine application or telephone and verified that I am speaking with the correct person using two identifiers. Location patient: home Location provider: work  Persons participating in the virtual visit: patient, provider  The limitations, risks, security and privacy concerns of performing an evaluation and management service by video and the availability of in person appointments have been discussed.  It has also been discussed with the patient that there may be a patient responsible charge related to this service. The patient expressed understanding and agreed to proceed.   Reason for visit: work in appt  HPI: Work in - concerns regarding UTI. Reports symptoms started 10-14 days ago.  Noticed some nausea.  Some abdominal pressure - suprapubic.  Some irritation with urination.  No vaginal discharge.  Burns after bathing.  Urine smells.  Increased urinary frequency.  No blood in urine.  No fever.  Has been drinking water.  Headache today.  Took tylenol.  States feels similar to previous UTIs.    ROS: See pertinent positives and negatives per HPI.  Past Medical History:  Diagnosis Date   Anemia    Anginal pain (HCC)    Anxiety    Asthma    Cataract cortical, senile    CHF (congestive heart failure) (HCC)    Chronic headaches    Diastolic heart failure (HCC)    Diverticulosis    Environmental allergies    GERD (gastroesophageal reflux disease)    Heart murmur    Hyperglycemia    Hyperlipidemia    Hypertension    Leukocytosis    Obesity    Osteoarthritis    Palpitations    Restless leg syndrome    Sleep apnea    Stroke (HCC)    Thrombocytosis    Urinary incontinence    mixed   Venous insufficiency    Vitamin D deficiency    Wears dentures    partial upper    Past Surgical History:  Procedure Laterality  Date   BLADDER SURGERY     x2   [facility]   BREAST BIOPSY Left 06/24/2022   Stereo Bx, X-clip, neg   BREAST BIOPSY Left 06/24/2022   Stereo Bx, ribbon clip, atypical lobular hyperplasis   BREAST BIOPSY  06/24/2022   Stereo Bx, Coil Clip, neg   BREAST BIOPSY Left 06/24/2022   MM LT BREAST BX W LOC DEV 1ST LESION IMAGE BX SPEC STEREO GUIDE 06/24/2022 [facility]   BREAST BIOPSY Left 06/24/2022   MM LT BREAST BX W LOC DEV EA AD LESION IMG BX SPEC STEREO GUIDE 06/24/2022 [facility]   BREAST BIOPSY Left 06/24/2022   MM LT BREAST BX W LOC DEV EA AD LESION IMG BX SPEC STEREO GUIDE 06/24/2022 [facility]   BREAST CYST ASPIRATION Bilateral 2005   approximate year   CARDIAC CATHETERIZATION     [name]   CATARACT EXTRACTION W/PHACO Right 04/24/2020   Procedure: CATARACT EXTRACTION PHACO AND INTRAOCULAR LENS PLACEMENT (IOC) RIGHT;  Surgeon: Galen Manila, MD;  Location: [facility];  Service: Ophthalmology;  Laterality: Right;  6.75 0:37.3   CATARACT EXTRACTION W/PHACO Left 01/27/2021   Procedure: CATARACT EXTRACTION PHACO AND INTRAOCULAR LENS PLACEMENT (IOC) LEFT;  Surgeon: Galen Manila, MD;  Location: [facility];  Service: Ophthalmology;  Laterality: Left;  6.75 00:43.9   CERVICAL CONE BIOPSY  CIS   CHOLECYSTECTOMY     COLONOSCOPY WITH PROPOFOL N/A 07/19/2016   Procedure: COLONOSCOPY WITH PROPOFOL;  Surgeon: Scot Jun, MD;  Location: [facility] END[facility]COPY;  Service: Endoscopy;  Laterality: N/A;   COLONOSCOPY WITH PROPOFOL N/A 10/20/2018   Procedure: COLONOSCOPY WITH PROPOFOL;  Surgeon: Christena Deem, MD;  Location: [facility]COPY;  Service: Endoscopy;  Laterality: N/A;   COLONOSCOPY WITH PROPOFOL N/A 01/11/2023   Procedure: COLONOSCOPY WITH PROPOFOL;  Surgeon: Regis Bill, MD;  Location: [facility] ENDOSCOPY;  Service: Endoscopy;  Laterality: N/A;   ESOPHAGOGASTRODUODENOSCOPY N/A 05/12/2021   Procedure: ESOPHAGOGASTRODUODENOSCOPY  (EGD);  Surgeon: Regis Bill, MD;  Location: [facility] ENDOSCOPY;  Service: Endoscopy;  Laterality: N/A;   ESOPHAGOGASTRODUODENOSCOPY (EGD) WITH PROPOFOL N/A 07/19/2016   Procedure: ESOPHAGOGASTRODUODENOSCOPY (EGD) WITH PROPOFOL;  Surgeon: Scot Jun, MD;  Location: [facility] ENDOSCOPY;  Service: Endoscopy;  Laterality: N/A;   FLEXIBLE SIGMOIDOSCOPY     KNEE ARTHROSCOPY  08/13/2008   knee replacement and revision     left   RECTOCELE REPAIR     RIGHT/LEFT HEART CATH AND CORONARY ANGIOGRAPHY Bilateral 04/10/2018   Procedure: RIGHT/LEFT HEART CATH AND CORONARY ANGIOGRAPHY;  Surgeon: Iran Ouch, MD;  Location: [facility];  Service: Cardiovascular;  Laterality: Bilateral;   ROTATOR CUFF REPAIR     bilateral   SHOULDER SURGERY  11/17/2005   TONSILLECTOMY  1962   VAGINAL HYSTERECTOMY  1974   abnormal pap and carcinoma in situ    Family History  Problem Relation Age of Onset   Diabetes Mellitus II Father    Thyroid disease Father    Alzheimer's disease Father    Breast cancer Maternal Aunt    Hypertension Mother    Heart Problems Brother    Leukemia Maternal Aunt    Alzheimer's disease Paternal Aunt    Alzheimer's disease Paternal Uncle    Alzheimer's disease Paternal Uncle    Alzheimer's disease Paternal Uncle    Alzheimer's disease Paternal Aunt    Colon cancer Neg Hx    Kidney cancer Neg Hx    Bladder Cancer Neg Hx     SOCIAL HX: reviewed.    Current Outpatient Medications:    nitrofurantoin, macrocrystal-monohydrate, (MACROBID) 100 MG capsule, Take 1 capsule (100 mg total) by mouth 2 (two) times daily., Disp: 10 capsule, Rfl: 0   acetaminophen (TYLENOL) 500 MG tablet, Take 500 mg by mouth every 6 (six) hours as needed., Disp: , Rfl:    albuterol (VENTOLIN HFA) 108 (90 Base) MCG/ACT inhaler, Inhale 2 puffs into the lungs every 4 (four) hours as needed for wheezing or shortness of breath., Disp: 1 each, Rfl: 0   anastrozole (ARIMIDEX) 1 MG tablet, Take 1  tablet (1 mg total) by mouth daily., Disp: 90 tablet, Rfl: 3   aspirin 325 MG tablet, Take 325 mg by mouth daily., Disp: , Rfl:    budesonide-formoterol (SYMBICORT) 80-4.5 MCG/ACT inhaler, Inhale 2 puffs into the lungs 2 (two) times daily., Disp: 1 each, Rfl: 3   clobetasol cream (TEMOVATE) 0.05 %, Apply 1 Application topically 2 (two) times daily., Disp: 30 g, Rfl: 0   Evolocumab (REPATHA) 140 MG/ML SOSY, INJECT 140 MG UNDER THE SKIN EVERY 14 DAYS, Disp: 6 mL, Rfl: 3   furosemide (LASIX) 20 MG tablet, Take 1 tablet (20 mg) by mouth once daily as needed for weight gain of 3 lbs or more overnight, Disp: , Rfl:    linaclotide (LINZESS) 290 MCG CAPS capsule, Take 290 mcg by mouth  daily before breakfast., Disp: , Rfl:    metoprolol succinate (TOPROL-XL) 100 MG 24 hr tablet, Take 1 tablet (100 mg total) by mouth daily., Disp: 90 tablet, Rfl: 3   Omega-3 Fatty Acids (FISH OIL) 1000 MG CAPS, Take 1,000 mg by mouth., Disp: , Rfl:    ondansetron (ZOFRAN) 4 MG tablet, Take 1 tablet (4 mg total) by mouth 2 (two) times daily as needed for nausea or vomiting., Disp: 15 tablet, Rfl: 0   potassium chloride (KLOR-CON) 10 MEQ tablet, Take 1 tablet (10 meq) by mouth once daily as needed only when taking lasix (furosemide), Disp: , Rfl:    pregabalin (LYRICA) 50 MG capsule, Take by mouth. Take 1 capsule by mouth in the morning and 1 capsules at afternoon, Disp: , Rfl:    RABEprazole (ACIPHEX) 20 MG tablet, Take 20 mg by mouth 2 (two) times daily., Disp: , Rfl:    sucralfate (CARAFATE) 1 g tablet, Take 1 g by mouth daily as needed (GI sympotms). , Disp: , Rfl:    venlafaxine XR (EFFEXOR-XR) 150 MG 24 hr capsule, Take 1 capsule (150 mg total) by mouth daily with breakfast., Disp: 90 capsule, Rfl: 3  EXAM:  GENERAL: alert, oriented, appears well and in no acute distress  HEENT: atraumatic, conjunttiva clear, no obvious abnormalities on inspection of external nose and ears  NECK: normal movements of the head and  neck  LUNGS: on inspection no signs of respiratory distress, breathing rate appears normal, no obvious gross SOB, gasping or wheezing  CV: no obvious cyanosis  PSYCH/NEURO: pleasant and cooperative, no obvious depression or anxiety, speech and thought processing grossly intact  ASSESSMENT AND PLAN:  Discussed the following assessment and plan:  Problem List Items Addressed This Visit     Essential hypertension    Continue toprol.  Follow blood pressure.       Dysuria - Primary    Symptoms as outlined.  Persistent.  Reports feels similar to previous UTI.  Urinalysis trace leukocytes.  Will start macrobid while waiting for culture results.  Stay hydrated.  Follow.  If persistent symptoms, will need to be evaluated to confirm no vaginal etiology, etc.        Other Visit Diagnoses     Medication refill       Relevant Medications   budesonide-formoterol (SYMBICORT) 80-4.5 MCG/ACT inhaler   Nausea       Relevant Medications   ondansetron (ZOFRAN) 4 MG tablet       Return if symptoms worsen or fail to improve.   I discussed the assessment and treatment plan with the patient. The patient was provided an opportunity to ask questions and all were answered. The patient agreed with the plan and demonstrated an understanding of the instructions.   The patient was advised to call back or seek an in-person evaluation if the symptoms worsen or if the condition fails to improve as anticipated.   Dale Wallis, MD

## 2023-03-30 ENCOUNTER — Telehealth: Payer: Self-pay

## 2023-03-30 NOTE — Telephone Encounter (Signed)
-----   Message from [place] sent at 03/30/2023 11:38 AM EDT ----- Please call and notify - urine culture is negative.  No UTI.  If persistent problems, needs to be reevaluated.

## 2023-03-30 NOTE — Telephone Encounter (Signed)
Pt called back and I read the message to her and she verbalized understanding 

## 2023-03-31 NOTE — Telephone Encounter (Signed)
noted 

## 2023-04-03 ENCOUNTER — Encounter: Payer: Self-pay | Admitting: Internal Medicine

## 2023-04-03 NOTE — Assessment & Plan Note (Signed)
Symptoms as outlined.  Persistent.  Reports feels similar to previous UTI.  Urinalysis trace leukocytes.  Will start macrobid while waiting for culture results.  Stay hydrated.  Follow.  If persistent symptoms, will need to be evaluated to confirm no vaginal etiology, etc.

## 2023-04-03 NOTE — Assessment & Plan Note (Signed)
Continue toprol.  Follow blood pressure.

## 2023-04-05 ENCOUNTER — Other Ambulatory Visit: Payer: Self-pay | Admitting: Family Medicine

## 2023-04-05 DIAGNOSIS — M5136 Other intervertebral disc degeneration, lumbar region: Secondary | ICD-10-CM | POA: Diagnosis not present

## 2023-04-05 DIAGNOSIS — M5416 Radiculopathy, lumbar region: Secondary | ICD-10-CM

## 2023-04-06 ENCOUNTER — Other Ambulatory Visit: Payer: Self-pay | Admitting: Medical

## 2023-04-20 ENCOUNTER — Ambulatory Visit
Admission: RE | Admit: 2023-04-20 | Discharge: 2023-04-20 | Disposition: A | Discharge status: Home / Self Care | Payer: Medicare Other | Source: Ambulatory Visit | Attending: Family Medicine | Admitting: Family Medicine

## 2023-04-20 DIAGNOSIS — M5416 Radiculopathy, lumbar region: Secondary | ICD-10-CM | POA: Diagnosis not present

## 2023-05-10 DIAGNOSIS — M5416 Radiculopathy, lumbar region: Secondary | ICD-10-CM | POA: Diagnosis not present

## 2023-05-10 DIAGNOSIS — M47816 Spondylosis without myelopathy or radiculopathy, lumbar region: Secondary | ICD-10-CM | POA: Diagnosis not present

## 2023-05-10 DIAGNOSIS — M5136 Other intervertebral disc degeneration, lumbar region: Secondary | ICD-10-CM | POA: Diagnosis not present

## 2023-05-10 DIAGNOSIS — M48062 Spinal stenosis, lumbar region with neurogenic claudication: Secondary | ICD-10-CM | POA: Diagnosis not present

## 2023-05-18 DIAGNOSIS — M79604 Pain in right leg: Secondary | ICD-10-CM | POA: Diagnosis not present

## 2023-05-18 DIAGNOSIS — R2681 Unsteadiness on feet: Secondary | ICD-10-CM | POA: Diagnosis not present

## 2023-05-18 DIAGNOSIS — M5451 Vertebrogenic low back pain: Secondary | ICD-10-CM | POA: Diagnosis not present

## 2023-05-27 DIAGNOSIS — R2681 Unsteadiness on feet: Secondary | ICD-10-CM | POA: Diagnosis not present

## 2023-05-27 DIAGNOSIS — M79604 Pain in right leg: Secondary | ICD-10-CM | POA: Diagnosis not present

## 2023-05-27 DIAGNOSIS — M5451 Vertebrogenic low back pain: Secondary | ICD-10-CM | POA: Diagnosis not present

## 2023-06-01 DIAGNOSIS — M79604 Pain in right leg: Secondary | ICD-10-CM | POA: Diagnosis not present

## 2023-06-01 DIAGNOSIS — M5451 Vertebrogenic low back pain: Secondary | ICD-10-CM | POA: Diagnosis not present

## 2023-06-01 DIAGNOSIS — R2681 Unsteadiness on feet: Secondary | ICD-10-CM | POA: Diagnosis not present

## 2023-06-03 ENCOUNTER — Other Ambulatory Visit (INDEPENDENT_AMBULATORY_CARE_PROVIDER_SITE_OTHER): Payer: Medicare Other

## 2023-06-03 DIAGNOSIS — R739 Hyperglycemia, unspecified: Secondary | ICD-10-CM

## 2023-06-03 DIAGNOSIS — E7800 Pure hypercholesterolemia, unspecified: Secondary | ICD-10-CM

## 2023-06-03 DIAGNOSIS — I10 Essential (primary) hypertension: Secondary | ICD-10-CM | POA: Diagnosis not present

## 2023-06-03 DIAGNOSIS — M5451 Vertebrogenic low back pain: Secondary | ICD-10-CM | POA: Diagnosis not present

## 2023-06-03 DIAGNOSIS — M79604 Pain in right leg: Secondary | ICD-10-CM | POA: Diagnosis not present

## 2023-06-03 DIAGNOSIS — R2681 Unsteadiness on feet: Secondary | ICD-10-CM | POA: Diagnosis not present

## 2023-06-03 LAB — HEPATIC FUNCTION PANEL
ALT: 10 U/L (ref 0–35)
AST: 13 U/L (ref 0–37)
Albumin: 4 g/dL (ref 3.5–5.2)
Alkaline Phosphatase: 90 U/L (ref 39–117)
Bilirubin, Direct: 0.1 mg/dL (ref 0.0–0.3)
Total Bilirubin: 0.5 mg/dL (ref 0.2–1.2)
Total Protein: 7.7 g/dL (ref 6.0–8.3)

## 2023-06-03 LAB — BASIC METABOLIC PANEL
BUN: 16 mg/dL (ref 6–23)
CO2: 29 meq/L (ref 19–32)
Calcium: 9.8 mg/dL (ref 8.4–10.5)
Chloride: 105 meq/L (ref 96–112)
Creatinine, Ser: 1.16 mg/dL (ref 0.40–1.20)
GFR: 46.9 mL/min — ABNORMAL LOW (ref >60.00)
Glucose, Bld: 89 mg/dL (ref 70–99)
Potassium: 4.4 meq/L (ref 3.5–5.1)
Sodium: 142 meq/L (ref 135–145)

## 2023-06-03 LAB — HEMOGLOBIN A1C: Hgb A1c MFr Bld: 6 % (ref 4.6–6.5)

## 2023-06-03 LAB — LIPID PANEL
Cholesterol: 170 mg/dL (ref 0–200)
HDL: 57.7 mg/dL (ref >39.00)
LDL Cholesterol: 88 mg/dL (ref 0–99)
NonHDL: 111.91
Total CHOL/HDL Ratio: 3
Triglycerides: 118 mg/dL (ref 0.0–149.0)
VLDL: 23.6 mg/dL (ref 0.0–40.0)

## 2023-06-03 LAB — TSH: TSH: 2.05 u[IU]/mL (ref 0.35–5.50)

## 2023-06-07 ENCOUNTER — Encounter: Payer: Medicare Other | Admitting: Internal Medicine

## 2023-06-08 DIAGNOSIS — M79604 Pain in right leg: Secondary | ICD-10-CM | POA: Diagnosis not present

## 2023-06-08 DIAGNOSIS — M5451 Vertebrogenic low back pain: Secondary | ICD-10-CM | POA: Diagnosis not present

## 2023-06-08 DIAGNOSIS — M65332 Trigger finger, left middle finger: Secondary | ICD-10-CM | POA: Diagnosis not present

## 2023-06-08 DIAGNOSIS — R2681 Unsteadiness on feet: Secondary | ICD-10-CM | POA: Diagnosis not present

## 2023-06-09 ENCOUNTER — Ambulatory Visit: Payer: Medicare Other | Admitting: Internal Medicine

## 2023-06-09 ENCOUNTER — Encounter: Payer: Self-pay | Admitting: Internal Medicine

## 2023-06-09 VITALS — BP 130/72 | HR 76 | Temp 98.0°F | Resp 16 | Ht 65.0 in | Wt 238.4 lb

## 2023-06-09 DIAGNOSIS — K219 Gastro-esophageal reflux disease without esophagitis: Secondary | ICD-10-CM

## 2023-06-09 DIAGNOSIS — T466X5A Adverse effect of antihyperlipidemic and antiarteriosclerotic drugs, initial encounter: Secondary | ICD-10-CM

## 2023-06-09 DIAGNOSIS — I6509 Occlusion and stenosis of unspecified vertebral artery: Secondary | ICD-10-CM

## 2023-06-09 DIAGNOSIS — M5451 Vertebrogenic low back pain: Secondary | ICD-10-CM | POA: Diagnosis not present

## 2023-06-09 DIAGNOSIS — G720 Drug-induced myopathy: Secondary | ICD-10-CM | POA: Diagnosis not present

## 2023-06-09 DIAGNOSIS — E7800 Pure hypercholesterolemia, unspecified: Secondary | ICD-10-CM

## 2023-06-09 DIAGNOSIS — Z23 Encounter for immunization: Secondary | ICD-10-CM

## 2023-06-09 DIAGNOSIS — M545 Low back pain, unspecified: Secondary | ICD-10-CM

## 2023-06-09 DIAGNOSIS — D473 Essential (hemorrhagic) thrombocythemia: Secondary | ICD-10-CM

## 2023-06-09 DIAGNOSIS — Z0000 Encounter for general adult medical examination without abnormal findings: Secondary | ICD-10-CM | POA: Diagnosis not present

## 2023-06-09 DIAGNOSIS — L900 Lichen sclerosus et atrophicus: Secondary | ICD-10-CM

## 2023-06-09 DIAGNOSIS — F439 Reaction to severe stress, unspecified: Secondary | ICD-10-CM

## 2023-06-09 DIAGNOSIS — M79604 Pain in right leg: Secondary | ICD-10-CM | POA: Diagnosis not present

## 2023-06-09 DIAGNOSIS — R739 Hyperglycemia, unspecified: Secondary | ICD-10-CM

## 2023-06-09 DIAGNOSIS — I471 Supraventricular tachycardia, unspecified: Secondary | ICD-10-CM

## 2023-06-09 DIAGNOSIS — N6099 Unspecified benign mammary dysplasia of unspecified breast: Secondary | ICD-10-CM

## 2023-06-09 DIAGNOSIS — I5030 Unspecified diastolic (congestive) heart failure: Secondary | ICD-10-CM

## 2023-06-09 DIAGNOSIS — I700 Atherosclerosis of aorta: Secondary | ICD-10-CM

## 2023-06-09 DIAGNOSIS — I10 Essential (primary) hypertension: Secondary | ICD-10-CM | POA: Diagnosis not present

## 2023-06-09 DIAGNOSIS — R2681 Unsteadiness on feet: Secondary | ICD-10-CM | POA: Diagnosis not present

## 2023-06-09 NOTE — Assessment & Plan Note (Signed)
Continue toprol.  Follow blood pressure.

## 2023-06-09 NOTE — Progress Notes (Signed)
Subjective:    Patient ID: Nicole Sims, female    DOB: 25-Feb-1951, 72 y.o.   MRN: [id number]  Patient here for  Chief Complaint  Patient presents with   Annual Exam    HPI Here for a physical exam. Saw ortho yesterday - left middle trigger finger. S/p injection - steroid.  Also seeing physiatry for her neck and back pain.  Last evaluated 05/10/23 - recommended to continue tylenol, exercises daily, lyrica.  Was referred to PT, with plans to f/u with Dr Myer Haff.  Currently going to PT.  Still with persistent back issues. Had colonoscopy 01/11/23 - fair  prep.  Recommended f/u colonoscopy in 2 years.  Had f/u with Dr Maia Plan - f/u atypical lobular hyperplasia of both breasts. Mammogram 12/24/22 -  Recommended 6 month diagnostic mammogram.  Seeing oncology - on anastrozole. Had f/u with Dr Kirke Corin 12/28/22 - paroxysmal SVT - continue toprol 100mg  q day.  Stable. F/u chronic diastolic heart failure.  Continue lasix prn. Continues on repatha. Discussed labs.  No chest pain or sob reported.  Discussed bowels and taking benefiber to keep bowels moving.     Past Medical History:  Diagnosis Date   Anemia    Anginal pain (HCC)    Anxiety    Asthma    Cataract cortical, senile    CHF (congestive heart failure) (HCC)    Chronic headaches    Diastolic heart failure (HCC)    Diverticulosis    Environmental allergies    GERD (gastroesophageal reflux disease)    Heart murmur    Hyperglycemia    Hyperlipidemia    Hypertension    Leukocytosis    Obesity    Osteoarthritis    Palpitations    Restless leg syndrome    Sleep apnea    Stroke (HCC)    Thrombocytosis    Urinary incontinence    mixed   Venous insufficiency    Vitamin D deficiency    Wears dentures    partial upper   Past Surgical History:  Procedure Laterality Date   BLADDER SURGERY     x2   [facility]   BREAST BIOPSY Left 06/24/2022   Stereo Bx, X-clip, neg   BREAST BIOPSY Left 06/24/2022   Stereo Bx, ribbon  clip, atypical lobular hyperplasis   BREAST BIOPSY  06/24/2022   Stereo Bx, Coil Clip, neg   BREAST BIOPSY Left 06/24/2022   MM LT BREAST BX W LOC DEV 1ST LESION IMAGE BX SPEC STEREO GUIDE 06/24/2022 [facility]   BREAST BIOPSY Left 06/24/2022   MM LT BREAST BX W LOC DEV EA AD LESION IMG BX SPEC STEREO GUIDE 06/24/2022 [facility]   BREAST BIOPSY Left 06/24/2022   MM LT BREAST BX W LOC DEV EA AD LESION IMG BX SPEC STEREO GUIDE 06/24/2022 [facility]   BREAST CYST ASPIRATION Bilateral 2005   approximate year   CARDIAC CATHETERIZATION     [name]   CATARACT EXTRACTION W/PHACO Right 04/24/2020   Procedure: CATARACT EXTRACTION PHACO AND INTRAOCULAR LENS PLACEMENT (IOC) RIGHT;  Surgeon: Galen Manila, MD;  Location: [facility];  Service: Ophthalmology;  Laterality: Right;  6.75 0:37.3   CATARACT EXTRACTION W/PHACO Left 01/27/2021   Procedure: CATARACT EXTRACTION PHACO AND INTRAOCULAR LENS PLACEMENT (IOC) LEFT;  Surgeon: Galen Manila, MD;  Location: [facility];  Service: Ophthalmology;  Laterality: Left;  6.75 00:43.9   CERVICAL CONE BIOPSY     CIS   CHOLECYSTECTOMY     COLONOSCOPY WITH PROPOFOL N/A  07/19/2016   Procedure: COLONOSCOPY WITH PROPOFOL;  Surgeon: Scot Jun, MD;  Location: [facility]COPY;  Service: Endoscopy;  Laterality: N/A;   COLONOSCOPY WITH PROPOFOL N/A 10/20/2018   Procedure: COLONOSCOPY WITH PROPOFOL;  Surgeon: Christena Deem, MD;  Location: [facility] ENDOSCOPY;  Service: Endoscopy;  Laterality: N/A;   COLONOSCOPY WITH PROPOFOL N/A 01/11/2023   Procedure: COLONOSCOPY WITH PROPOFOL;  Surgeon: Regis Bill, MD;  Location: [facility]OSCOPY;  Service: Endoscopy;  Laterality: N/A;   ESOPHAGOGASTRODUODENOSCOPY N/A 05/12/2021   Procedure: ESOPHAGOGASTRODUODENOSCOPY (EGD);  Surgeon: Regis Bill, MD;  Location: [facility] END[facility]COPY;  Service: Endoscopy;  Laterality: N/A;   ESOPHAGOGASTRODUODENOSCOPY (EGD) WITH PROPOFOL N/A  07/19/2016   Procedure: ESOPHAGOGASTRODUODENOSCOPY (EGD) WITH PROPOFOL;  Surgeon: Scot Jun, MD;  Location: [facility] ENDOSCOPY;  Service: Endoscopy;  Laterality: N/A;   FLEXIBLE SIGMOIDOSCOPY     KNEE ARTHROSCOPY  08/13/2008   knee replacement and revision     left   RECTOCELE REPAIR     RIGHT/LEFT HEART CATH AND CORONARY ANGIOGRAPHY Bilateral 04/10/2018   Procedure: RIGHT/LEFT HEART CATH AND CORONARY ANGIOGRAPHY;  Surgeon: Iran Ouch, MD;  Location: [facility];  Service: Cardiovascular;  Laterality: Bilateral;   ROTATOR CUFF REPAIR     bilateral   SHOULDER SURGERY  11/17/2005   TONSILLECTOMY  1962   VAGINAL HYSTERECTOMY  1974   abnormal pap and carcinoma in situ   Family History  Problem Relation Age of Onset   Diabetes Mellitus II Father    Thyroid disease Father    Alzheimer's disease Father    Breast cancer Maternal Aunt    Hypertension Mother    Heart Problems Brother    Leukemia Maternal Aunt    Alzheimer's disease Paternal Aunt    Alzheimer's disease Paternal Uncle    Alzheimer's disease Paternal Uncle    Alzheimer's disease Paternal Uncle    Alzheimer's disease Paternal Aunt    Colon cancer Neg Hx    Kidney cancer Neg Hx    Bladder Cancer Neg Hx    Social History   Socioeconomic History   Marital status: Married    Spouse name: Not on file   Number of children: Not on file   Years of education: Not on file   Highest education level: 11th grade  Occupational History   Occupation: retired  Tobacco Use   Smoking status: Former    Current packs/day: 0.00    Average packs/day: 1 pack/day for 15.0 years (15.0 ttl pk-yrs)    Types: Cigarettes    Start date: 08/16/1968    Quit date: 08/17/1983    Years since quitting: 39.8   Smokeless tobacco: Never  Vaping Use   Vaping status: Never Used  Substance and Sexual Activity   Alcohol use: Yes    Alcohol/week: 0.0 standard drinks of alcohol    Comment: rarely   Drug use: No   Sexual activity: Not  on file  Other Topics Concern   Not on file  Social History Narrative   Lives at home with husband   Social Determinants of Health   Financial Resource Strain: Low Risk  (06/06/2023)   Overall Financial Resource Strain (CARDIA)    Difficulty of Paying Living Expenses: Not hard at all  Food Insecurity: No Food Insecurity (06/06/2023)   Hunger Vital Sign    Worried About Running Out of Food in the Last Year: Never true    Ran Out of Food in the Last Year: Never true  Transportation Needs: No  Transportation Needs (06/06/2023)   PRAPARE - Administrator, Civil Service (Medical): No    Lack of Transportation (Non-Medical): No  Physical Activity: Insufficiently Active (06/06/2023)   Exercise Vital Sign    Days of Exercise per Week: 3 days    Minutes of Exercise per Session: 20 min  Stress: No Stress Concern Present (06/06/2023)   [employer] of Occupational Health - Occupational Stress Questionnaire    Feeling of Stress : Not at all  Social Connections: Socially Integrated (06/06/2023)   Social Connection and Isolation Panel [NHANES]    Frequency of Communication with Friends and Family: More than three times a week    Frequency of Social Gatherings with Friends and Family: Twice a week    Attends Religious Services: More than 4 times per year    Active Member of [employer] or Organizations: Yes    Attends Engineer, structural: More than 4 times per year    Marital Status: Married     Review of Systems  Constitutional:  Negative for appetite change and unexpected weight change.  HENT:  Negative for congestion, sinus pressure and sore throat.   Eyes:  Negative for pain and visual disturbance.  Respiratory:  Negative for cough, chest tightness and shortness of breath.   Cardiovascular:  Negative for chest pain, palpitations and leg swelling.  Gastrointestinal:  Negative for abdominal pain, diarrhea, nausea and vomiting.  Genitourinary:  Negative for difficulty  urinating and dysuria.  Musculoskeletal:  Positive for back pain. Negative for joint swelling and myalgias.  Skin:  Negative for color change and rash.  Neurological:  Negative for dizziness and headaches.  Hematological:  Negative for adenopathy. Does not bruise/bleed easily.  Psychiatric/Behavioral:  Negative for agitation and dysphoric mood.        Objective:     BP 130/72   Pulse 76   Temp 98 F (36.7 C)   Resp 16   Ht 5\' 5"  (1.651 m)   Wt 238 lb 6.4 oz (108.1 kg)   SpO2 98%   BMI 39.67 kg/m  Wt Readings from Last 3 Encounters:  06/09/23 238 lb 6.4 oz (108.1 kg)  03/29/23 240 lb (108.9 kg)  02/11/23 240 lb (108.9 kg)    Physical Exam Vitals reviewed.  Constitutional:      General: She is not in acute distress.    Appearance: Normal appearance.  HENT:     Head: Normocephalic and atraumatic.     Right Ear: External ear normal.     Left Ear: External ear normal.  Eyes:     General: No scleral icterus.       Right eye: No discharge.        Left eye: No discharge.     Conjunctiva/sclera: Conjunctivae normal.  Neck:     Thyroid: No thyromegaly.  Cardiovascular:     Rate and Rhythm: Normal rate and regular rhythm.  Pulmonary:     Effort: No respiratory distress.     Breath sounds: Normal breath sounds. No wheezing.     Comments: Breasts - per Dr Maia Plan  Abdominal:     General: Bowel sounds are normal.     Palpations: Abdomen is soft.     Tenderness: There is no abdominal tenderness.  Musculoskeletal:        General: No swelling or tenderness.     Cervical back: Neck supple. No tenderness.  Lymphadenopathy:     Cervical: No cervical adenopathy.  Skin:    Findings: No  erythema or rash.  Neurological:     Mental Status: She is alert.  Psychiatric:        Mood and Affect: Mood normal.        Behavior: Behavior normal.      Outpatient Encounter Medications as of 06/09/2023  Medication Sig   acetaminophen (TYLENOL) 500 MG tablet Take 500 mg by mouth  every 6 (six) hours as needed.   albuterol (VENTOLIN HFA) 108 (90 Base) MCG/ACT inhaler Inhale 2 puffs into the lungs every 4 (four) hours as needed for wheezing or shortness of breath.   anastrozole (ARIMIDEX) 1 MG tablet Take 1 tablet (1 mg total) by mouth daily.   aspirin 325 MG tablet Take 325 mg by mouth daily.   budesonide-formoterol (SYMBICORT) 80-4.5 MCG/ACT inhaler Inhale 2 puffs into the lungs 2 (two) times daily.   clobetasol cream (TEMOVATE) 0.05 % Apply 1 Application topically 2 (two) times daily.   Evolocumab (REPATHA) 140 MG/ML SOSY INJECT 140 MG UNDER THE SKIN EVERY 14 DAYS   furosemide (LASIX) 20 MG tablet Take 1 tablet (20 mg) by mouth once daily as needed for weight gain of 3 lbs or more overnight   linaclotide (LINZESS) 290 MCG CAPS capsule Take 290 mcg by mouth daily before breakfast.   metoprolol succinate (TOPROL-XL) 100 MG 24 hr tablet TAKE 1 TABLET BY MOUTH DAILY   Omega-3 Fatty Acids (FISH OIL) 1000 MG CAPS Take 1,000 mg by mouth.   ondansetron (ZOFRAN) 4 MG tablet Take 1 tablet (4 mg total) by mouth 2 (two) times daily as needed for nausea or vomiting.   potassium chloride (KLOR-CON) 10 MEQ tablet Take 1 tablet (10 meq) by mouth once daily as needed only when taking lasix (furosemide)   pregabalin (LYRICA) 50 MG capsule Take by mouth. Take 1 capsule by mouth in the morning, 1 capsule at afternoon, 2 capsules at bedtime.   RABEprazole (ACIPHEX) 20 MG tablet Take 20 mg by mouth 2 (two) times daily.   sucralfate (CARAFATE) 1 g tablet Take 1 g by mouth daily as needed (GI sympotms).    venlafaxine XR (EFFEXOR-XR) 150 MG 24 hr capsule Take 1 capsule (150 mg total) by mouth daily with breakfast.   [DISCONTINUED] nitrofurantoin, macrocrystal-monohydrate, (MACROBID) 100 MG capsule Take 1 capsule (100 mg total) by mouth 2 (two) times daily.   No facility-administered encounter medications on file as of 06/09/2023.     Lab Results  Component Value Date   WBC 7.8 10/01/2022    HGB 13.2 10/01/2022   HCT 40.4 10/01/2022   PLT 444.0 (H) 10/01/2022   GLUCOSE 89 06/03/2023   CHOL 170 06/03/2023   TRIG 118.0 06/03/2023   HDL 57.70 06/03/2023   LDLDIRECT 172.8 09/17/2013   LDLCALC 88 06/03/2023   ALT 10 06/03/2023   AST 13 06/03/2023   NA 142 06/03/2023   K 4.4 06/03/2023   CL 105 06/03/2023   CREATININE 1.16 06/03/2023   BUN 16 06/03/2023   CO2 29 06/03/2023   TSH 2.05 06/03/2023   INR 1.0 06/11/2019   HGBA1C 6.0 06/03/2023   MICROALBUR 1.1 04/28/2015    MR LUMBAR SPINE WO CONTRAST  Result Date: 05/06/2023 CLINICAL DATA:  Lumbar radiculitis EXAM: MRI LUMBAR SPINE WITHOUT CONTRAST TECHNIQUE: Multiplanar, multisequence MR imaging of the lumbar spine was performed. No intravenous contrast was administered. COMPARISON:  08/22/2019 FINDINGS: Segmentation:  Standard. Alignment:  Physiologic. Vertebrae: No acute fracture, evidence of discitis, or aggressive bone lesion. Conus medullaris and cauda equina: Conus extends  to the L1 level. Conus and cauda equina appear normal. Paraspinal and other soft tissues: No acute paraspinal abnormality. Disc levels: Disc spaces: Degenerative disease with disc height loss at L3-4. Disc desiccation and mild disc height loss at L1-2 and L2-3. T12-L1: No significant disc bulge. No neural foraminal stenosis. No central canal stenosis. L1-L2: Mild broad-based disc bulge. Mild bilateral facet arthropathy. No foraminal or central canal stenosis. L2-L3: Mild broad-based disc bulge. Mild bilateral facet arthropathy. No foraminal or central canal stenosis. L3-L4: Broad-based disc bulge. Mild bilateral facet arthropathy. Mild spinal stenosis. No foraminal stenosis. L4-L5: Mild broad-based disc bulge. Moderate bilateral facet arthropathy. Moderate spinal stenosis. No foraminal stenosis. L5-S1: No significant disc bulge. Moderate bilateral facet arthropathy. No foraminal or central canal stenosis. IMPRESSION: 1. Lumbar spine spondylosis as described  above. 2. No acute osseous injury of the lumbar spine. Electronically Signed   By: Elige Ko M.D.   On: 05/06/2023 11:40       Assessment & Plan:  Routine general medical examination at a health care facility  Health care maintenance Assessment & Plan: Physical today 06/09/23  Mammogram 12/24/22 - Birads III.  Scheduled for f/u diagnostic mammogram 06/28/23. Colonoscopy 10/2018.  Recommended f/u in 5 years. [place] Score f/u with Dr Feliberto Gottron.    Hypercholesteremia Assessment & Plan: On repatha and doing well.  Follow.    Orders: -     Lipid panel; Future -     Hepatic function panel; Future -     Basic metabolic panel; Future -     CBC with Differential/Platelet; Future  Hyperglycemia -     Hemoglobin A1c; Future  Need for influenza vaccination -     Flu Vaccine Trivalent High Dose (Fluad)  Stenosis of vertebral artery without cerebral infarction, unspecified laterality Assessment & Plan: Continue aspirin and repatha.    Stress Assessment & Plan: Continue effexor. Appears to be stable.    Statin myopathy Assessment & Plan: Previous joint aching.  On repatha now.  Off statin.  Follow.    PSVT (paroxysmal supraventricular tachycardia) (HCC) Assessment & Plan: Has a history of PSVT.  Has seen Dr Kirke Corin.  On metoprolol.  No increased heart rate or palpitations reported.  Continue metoprolol.     Midline low back pain without sciatica, unspecified chronicity Assessment & Plan: Seeing physiatry for her neck and back pain.  Last evaluated 05/10/23 - recommended to continue tylenol, exercises daily, lyrica.  Was referred to PT, with plans to f/u with Dr Myer Haff.  Currently going to PT.  Still with persistent back issues.   Lichen sclerosus Assessment & Plan: Saw gyn 05/2021.  PAP negative.  Needs to continue f/u with gyn.    Gastroesophageal reflux disease without esophagitis Assessment & Plan: Continue aciphex.     Essential hypertension Assessment &  Plan: Continue toprol.  Follow blood pressure.    Essential (hemorrhagic) thrombocythemia (HCC) Assessment & Plan: Worked up previously by hematology.  Follow cbc.    Diastolic heart failure, unspecified HF chronicity (HCC) Assessment & Plan: History of diastolic dysfunction.  EF 60-65%.  No increased sob reported.  Follow. Continue metoprolol.    Atypical hyperplasia of breast Assessment & Plan: Had f/u with Dr Maia Plan - f/u atypical lobular hyperplasia of both breasts. Mammogram 12/24/22 -  Recommended 6 month diagnostic mammogram.  Seeing oncology - on anastrozole. F/u scheduled for 06/28/23   Aortic atherosclerosis [facility]) Assessment & Plan: On repatha.  Continue.       Dale [place], MD

## 2023-06-09 NOTE — Assessment & Plan Note (Signed)
Previous joint aching.  On repatha now.  Off statin.  Follow.  

## 2023-06-09 NOTE — Assessment & Plan Note (Signed)
Worked up previously by hematology.  Follow cbc.  

## 2023-06-09 NOTE — Assessment & Plan Note (Signed)
Had f/u with Dr Maia Plan - f/u atypical lobular hyperplasia of both breasts. Mammogram 12/24/22 -  Recommended 6 month diagnostic mammogram.  Seeing oncology - on anastrozole. F/u scheduled for 06/28/23

## 2023-06-09 NOTE — Assessment & Plan Note (Signed)
Has a history of PSVT.  Has seen Dr Arida.  On metoprolol.  No increased heart rate or palpitations reported.  Continue metoprolol.   

## 2023-06-09 NOTE — Assessment & Plan Note (Signed)
Saw gyn 05/2021.  PAP negative.  Needs to continue f/u with gyn.

## 2023-06-09 NOTE — Assessment & Plan Note (Signed)
Continue aspirin and repatha.  

## 2023-06-09 NOTE — Assessment & Plan Note (Signed)
On repatha and doing well.  Follow.   

## 2023-06-09 NOTE — Assessment & Plan Note (Signed)
Continue aciphex.  

## 2023-06-09 NOTE — Assessment & Plan Note (Signed)
On repatha.  Continue.  

## 2023-06-09 NOTE — Assessment & Plan Note (Signed)
Seeing physiatry for her neck and back pain.  Last evaluated 05/10/23 - recommended to continue tylenol, exercises daily, lyrica.  Was referred to PT, with plans to f/u with Dr Myer Haff.  Currently going to PT.  Still with persistent back issues.

## 2023-06-09 NOTE — Assessment & Plan Note (Addendum)
Physical today 06/09/23  Mammogram 12/24/22 - Birads III.  Scheduled for f/u diagnostic mammogram 06/28/23. Colonoscopy 10/2018.  Recommended f/u in 5 years. [place] Score f/u with Dr Feliberto Gottron.

## 2023-06-09 NOTE — Assessment & Plan Note (Signed)
History of diastolic dysfunction.  EF 60-65%.  No increased sob reported.  Follow. Continue metoprolol.  

## 2023-06-09 NOTE — Assessment & Plan Note (Signed)
Continue effexor. Appears to be stable.  

## 2023-06-15 ENCOUNTER — Encounter: Payer: Self-pay | Admitting: Emergency Medicine

## 2023-06-15 ENCOUNTER — Emergency Department: Payer: Medicare Other

## 2023-06-15 ENCOUNTER — Emergency Department
Admission: EM | Admit: 2023-06-15 | Discharge: 2023-06-15 | Disposition: A | Discharge status: Home / Self Care | Payer: Medicare Other | Attending: Emergency Medicine | Admitting: Emergency Medicine

## 2023-06-15 ENCOUNTER — Other Ambulatory Visit: Payer: Self-pay

## 2023-06-15 DIAGNOSIS — Z96651 Presence of right artificial knee joint: Secondary | ICD-10-CM | POA: Diagnosis not present

## 2023-06-15 DIAGNOSIS — I5033 Acute on chronic diastolic (congestive) heart failure: Secondary | ICD-10-CM | POA: Insufficient documentation

## 2023-06-15 DIAGNOSIS — X500XXA Overexertion from strenuous movement or load, initial encounter: Secondary | ICD-10-CM | POA: Diagnosis not present

## 2023-06-15 DIAGNOSIS — M25571 Pain in right ankle and joints of right foot: Secondary | ICD-10-CM | POA: Diagnosis not present

## 2023-06-15 DIAGNOSIS — Z471 Aftercare following joint replacement surgery: Secondary | ICD-10-CM | POA: Diagnosis not present

## 2023-06-15 DIAGNOSIS — S82831A Other fracture of upper and lower end of right fibula, initial encounter for closed fracture: Secondary | ICD-10-CM | POA: Diagnosis not present

## 2023-06-15 DIAGNOSIS — S99911A Unspecified injury of right ankle, initial encounter: Secondary | ICD-10-CM | POA: Diagnosis present

## 2023-06-15 DIAGNOSIS — Z8616 Personal history of COVID-19: Secondary | ICD-10-CM | POA: Diagnosis not present

## 2023-06-15 DIAGNOSIS — S8264XA Nondisplaced fracture of lateral malleolus of right fibula, initial encounter for closed fracture: Secondary | ICD-10-CM | POA: Diagnosis not present

## 2023-06-15 DIAGNOSIS — M79661 Pain in right lower leg: Secondary | ICD-10-CM | POA: Diagnosis not present

## 2023-06-15 DIAGNOSIS — I110 Hypertensive heart disease with heart failure: Secondary | ICD-10-CM | POA: Insufficient documentation

## 2023-06-15 MED ORDER — ACETAMINOPHEN 325 MG PO TABS
650.0000 mg | ORAL_TABLET | Freq: Once | ORAL | Status: AC
  Administered 2023-06-15: 650 mg via ORAL
  Filled 2023-06-15: qty 2

## 2023-06-15 NOTE — Discharge Instructions (Signed)
Please follow-up with orthopedics.  Please keep your leg elevated is much as possible and continue to apply ice to the area.  Please return for any new, worsening, or change in symptoms or other concerns.  It was a pleasure caring for you today.

## 2023-06-15 NOTE — ED Triage Notes (Signed)
Pt via POV from home, Pt had a mechanical fall out of the back door. Pt c/o R ankle pain, believes it may have twisted. Denies LOC. Denies head injury. Pt is A&Ox4 and NAD

## 2023-06-15 NOTE — ED Notes (Signed)
Pt A&O x4, no obvious distress noted, respirations regular/unlabored. Pt verbalizes understanding of discharge instructions. Pt able to ambulate from ED independently.   

## 2023-06-15 NOTE — ED Provider Notes (Signed)
[facility] Provider Note    Event Date/Time   First MD Initiated Contact with Patient 06/15/23 1142     (approximate)   History   Fall and Ankle Pain   HPI  Nicole Sims is a 72 y.o. female who presents today for evaluation of right ankle pain.  Patient reports that she was walking outside on her deck and slipped and inverted her right ankle.  She reports that she fell onto her left knee, though was able to get herself up and was ambulatory since the event.  There is no head strike or LOC.  She denies weakness or paresthesias.  She is unable to ambulate, though has pain with ambulation.  No other injury sustained.  Patient Active Problem List   Diagnosis Date Noted   Low back pain 02/12/2023   Statin myopathy 10/12/2022   Ear drainage 10/12/2022   Acute otitis externa of left ear 09/12/2022   Left ear pain 09/12/2022   Mini stroke 09/09/2022   Atypical hyperplasia of breast 07/22/2022   Abnormal mammogram 06/30/2022   Breast tenderness 06/07/2022   Cough 03/07/2022   Constipation 12/05/2021   Dysuria 09/26/2021   COVID-19 09/11/2021   Urinary urgency 08/30/2021   Vaginal irritation 08/30/2021   PSVT (paroxysmal supraventricular tachycardia) (HCC) 08/30/2021   Other dental procedure status 08/30/2021   Stenosis of vertebral artery without cerebral infarction 08/30/2021   Achilles tendon pain 05/23/2021   Diarrhea 01/31/2021   Weight loss 01/31/2021   Aortic atherosclerosis (HCC) 01/03/2021   Left arm pain 01/03/2021   Dysphagia 11/08/2020   Breast cancer screening 11/08/2020   Sore throat 10/28/2020   Hypercholesteremia 02/17/2020   Rib pain 02/15/2020   ANA positive 08/03/2019   Primary osteoarthritis involving multiple joints 08/03/2019   Lower extremity weakness 06/25/2019   Ataxia    Joint pain 06/24/2019   Arthropathy, lower leg 05/24/2019   CHF NYHA class III (symptoms with mildly strenuous activities), acute on chronic, diastolic  (HCC) 11/22/2018   Iron deficiency anemia 10/12/2018   Diastolic heart failure (HCC) 04/10/2018   Vitamin D deficiency 04/02/2017   Leg pain, bilateral 01/10/2017   Dry eyes 10/24/2016   Blurred vision 06/07/2016   Nasal congestion 10/21/2015   Abdominal pain in female 10/20/2015   Persistent cough 08/25/2015   OSA (obstructive sleep apnea) 01/21/2015   Essential hypertension 01/21/2015   Lichen sclerosus 12/17/2014   Chest pain 12/17/2014   Health care maintenance 12/17/2014   Stress 02/24/2014   Spondylosis of cervical region without myelopathy or radiculopathy 01/28/2014   Myofascial pain 01/25/2014   Radiculitis, cervical 01/25/2014   Tachycardia 09/30/2013   Palpitations 08/21/2012   Osteoarthritis 08/21/2012   Combined hyperlipidemia 08/21/2012   Gastroesophageal reflux disease 08/21/2012   Essential (hemorrhagic) thrombocythemia (HCC) 08/21/2012   Venous insufficiency of both lower extremities 08/21/2012   Chronic headaches 08/21/2012          Physical Exam   Triage Vital Signs: ED Triage Vitals  Encounter Vitals Group     BP 06/15/23 1130 128/71     Systolic BP Percentile --      Diastolic BP Percentile --      Pulse Rate 06/15/23 1130 (!) 54     Resp 06/15/23 1130 20     Temp 06/15/23 1130 98.4 F (36.9 C)     Temp Source 06/15/23 1130 Oral     SpO2 06/15/23 1130 100 %     Weight 06/15/23 1127 235 lb (106.6  kg)     Height 06/15/23 1127 5\' 5"  (1.651 m)     Head Circumference --      Peak Flow --      Pain Score 06/15/23 1127 8     Pain Loc --      Pain Education --      Exclude from Growth Chart --     Most recent vital signs: Vitals:   06/15/23 1130 06/15/23 1432  BP: 128/71 121/78  Pulse: (!) 54 61  Resp: 20 20  Temp: 98.4 F (36.9 C) 98.6 F (37 C)  SpO2: 100% 99%    Physical Exam Vitals and nursing note reviewed.  Constitutional:      General: Awake and alert. No acute distress.    Appearance: Normal appearance. The patient is  normal weight.  HENT:     Head: Normocephalic and atraumatic.     Mouth: Mucous membranes are moist.  Eyes:     General: PERRL. Normal EOMs        Right eye: No discharge.        Left eye: No discharge.     Conjunctiva/sclera: Conjunctivae normal.  Cardiovascular:     Rate and Rhythm: Normal rate and regular rhythm.     Pulses: Normal pulses.     Heart sounds: Normal heart sounds Pulmonary:     Effort: Pulmonary effort is normal. No respiratory distress.     Breath sounds: Normal breath sounds.  Abdominal:     Abdomen is soft. There is no abdominal tenderness. No rebound or guarding. No distention. Musculoskeletal:        General: No swelling. Normal range of motion.     Cervical back: Normal range of motion and neck supple.  Left knee: No deformity or rash. No joint line tenderness. No patellar tenderness, no ballotment Warm and well perfused extremity with 2+ pedal pulses 5/5 strength to dorsiflexion and plantarflexion at the ankle with intact sensation throughout extremity Normal range of motion of the knee, with intact flexion and extension to active and passive range of motion. Extensor mechanism intact. No ligamentous laxity. Negative anterior/posterior drawer/negative lachman, negative mcmurrays No effusion or warmth Intact quadriceps, hamstring function, patellar tendon function Pelvis stable Full ROM of ankle without pain or swelling Foot warm and well perfused Right ankle: Tenderness and swelling over the anterior talofibular ligament and lateral malleolus, no medial malleolar tenderness or proximal fifth metacarpal tenderness.  Mild proximal fibular tenderness. 2+ pedal pulses with brisk capillary refill. Intact distal sensation and strength with normal ROM. Able to plantar flex and dorsiflex against resistance. Able to invert and evert against resistance.  Negative squeeze test. Negative Thompson test.  No open wounds. Skin:    General: Skin is warm and dry.      Capillary Refill: Capillary refill takes less than 2 seconds.     Findings: No rash.  Neurological:     Mental Status: The patient is awake and alert.      ED Results / Procedures / Treatments   Labs (all labs ordered are listed, but only abnormal results are displayed) Labs Reviewed - No data to display   EKG     RADIOLOGY I independently reviewed and interpreted imaging and agree with radiologists findings.     PROCEDURES:  Critical Care performed:   Procedures   MEDICATIONS ORDERED IN ED: Medications  acetaminophen (TYLENOL) tablet 650 mg (650 mg Oral Given 06/15/23 1409)     IMPRESSION / MDM / ASSESSMENT AND PLAN /  ED COURSE  I reviewed the triage vital signs and the nursing notes.   Differential diagnosis includes, but is not limited to, fracture, sprain, contusion. Patient has swelling and tenderness to palpation over lateral malleolus and ATFL.  Therefore per [place] ankle rules x-ray was obtained.  She also had tenderness to her proximal tib-fib and therefore tib-fib x-ray was obtained as well.  There was no fracture noted on her x-ray of her ankle, though there is a lucency noted on her lateral malleolus of her tib-fib which was read by the radiologist as a possible nondisplaced fracture.  Given that this is where she has swelling and tenderness, will treat as a nondisplaced fracture.  This would be a Weber a classification and is stable.  There is no knee pain or swelling and no ligamental laxity, do not suspect knee injury.  There is no proximal fifth metatarsal tenderness concerning for Jones fracture.  Negative squeeze test so do not suspect high ankle sprain.  Negative Thompson test, do not suspect Achilles tendon rupture.  She was given a cam boot.  We discussed Rice and orthopedic follow-up.  Patient was treated symptomatically in the emergency department with improvement of symptoms.  She was given the appropriate follow-up information for orthopedics.   Recommended rest, ice, elevation.  Patient understands and agrees with plan.   Patient's presentation is most consistent with acute complicated illness / injury requiring diagnostic workup.      FINAL CLINICAL IMPRESSION(S) / ED DIAGNOSES   Final diagnoses:  Closed nondisplaced fracture of lateral malleolus of right fibula, initial encounter  Closed avulsion fracture of distal end of right fibula, initial encounter     Rx / DC Orders   ED Discharge Orders     None        Note:  This document was prepared using Dragon voice recognition software and may include unintentional dictation errors.   Jackelyn Hoehn, PA-C 06/15/23 1446    Jene Every, MD 06/15/23 [phone]

## 2023-06-21 NOTE — Progress Notes (Deleted)
Referring Physician:  Denton Lank, FNP [address] [place],  [place] [address]  Primary Physician:  Dale [place], MD  History of Present Illness: 06/21/2023 [name] is here today with a chief complaint of ***  low back pain with radiation into the bilateral anterior lateral thighs, intermittent feeling of weakness in bilateral lower extremities  Duration: 10+years Location: *** Quality: *** Severity: ***  Precipitating: aggravated by bending, lifting, and pulling weeds  Modifying factors: made better by ***? Weakness: none Timing: constant Bowel/Bladder Dysfunction: none  Conservative measures:  Physical therapy: *** has not participated in PT Multimodal medical therapy including regular antiinflammatories: Tylenol, Lyrica, Tramadol, Aleve, Gabapentin, Prednisone, flexeril, Cymbalta Injections:  03/14/2023: Bilateral L5-S1 transforaminal ESI (80% relief) 01/05/2023: RFA to the bilateral L4-5 and L5-S1 facet joints (no relief) 02/25/2021: RFA to the bilateral L4-5 and L5-S1 facet joints (90% relief for 10 months) 02/06/2021: MBB to the bilateral L4-5 and L5-S1 facet joints (8/10 to 0/10) 01/08/2021: MBB to the bilateral L4-5 and L5-S1 facet joints (8/10 to 1/10) 09/02/2020: Bilateral L3-4 transforaminal ESI (70% relief)  08/05/2020: Bilateral L3-4 transforaminal ESI (50% relief) 05/16/2020: Left C3-4 and left C4-5 facet joint injections (moderate relief) 06/22/2019: Bilateral L3-4 transforaminal ESI (moderate relief) 05/18/2019: Bilateral L4-5 transforaminal ESI (mild relief) 11/03/2017: Bilateral L4-5 transforaminal ESI (mild relief) 07/26/2017: Left C4-5 and C5-6 facet joint injections  04/15/2016: L4-5 transforaminal ESI 01/22/2016: Right C4-5 and C5-6 facet joint injections (75% relief) 08/01/2012: Right C4-5 and C5-6 facet joint injections (greater than 50% relief) 06/06/2012: Right C4-5 transforaminal ESI (20% relief) 05/09/2012: Right C4-5  transforaminal ESI   Past Surgery: ***None  Razia A Mclucas has ***no symptoms of cervical myelopathy.  The symptoms are causing a significant impact on the patient's life.   I have utilized the care everywhere function in epic to review the outside records available from external health systems.  Review of Systems:  A 10 point review of systems is negative, except for the pertinent positives and negatives detailed in the HPI.  Past Medical History: Past Medical History:  Diagnosis Date   Anemia    Anginal pain (HCC)    Anxiety    Asthma    Cataract cortical, senile    CHF (congestive heart failure) (HCC)    Chronic headaches    Diastolic heart failure (HCC)    Diverticulosis    Environmental allergies    GERD (gastroesophageal reflux disease)    Heart murmur    Hyperglycemia    Hyperlipidemia    Hypertension    Leukocytosis    Obesity    Osteoarthritis    Palpitations    Restless leg syndrome    Sleep apnea    Stroke (HCC)    Thrombocytosis    Urinary incontinence    mixed   Venous insufficiency    Vitamin D deficiency    Wears dentures    partial upper    Past Surgical History: Past Surgical History:  Procedure Laterality Date   BLADDER SURGERY     x2   [facility]   BREAST BIOPSY Left 06/24/2022   Stereo Bx, X-clip, neg   BREAST BIOPSY Left 06/24/2022   Stereo Bx, ribbon clip, atypical lobular hyperplasis   BREAST BIOPSY  06/24/2022   Stereo Bx, Coil Clip, neg   BREAST BIOPSY Left 06/24/2022   MM LT BREAST BX W LOC DEV 1ST LESION IMAGE BX SPEC STEREO GUIDE 06/24/2022 [facility]   BREAST BIOPSY Left 06/24/2022   MM  LT BREAST BX W LOC DEV EA AD LESION IMG BX SPEC STEREO GUIDE 06/24/2022 [facility]   BREAST BIOPSY Left 06/24/2022   MM LT BREAST BX W LOC DEV EA AD LESION IMG BX SPEC STEREO GUIDE 06/24/2022 [facility]   BREAST CYST ASPIRATION Bilateral 2005   approximate year   CARDIAC CATHETERIZATION     [name]   CATARACT  EXTRACTION W/PHACO Right 04/24/2020   Procedure: CATARACT EXTRACTION PHACO AND INTRAOCULAR LENS PLACEMENT (IOC) RIGHT;  Surgeon: Galen Manila, MD;  Location: [facility];  Service: Ophthalmology;  Laterality: Right;  6.75 0:37.3   CATARACT EXTRACTION W/PHACO Left 01/27/2021   Procedure: CATARACT EXTRACTION PHACO AND INTRAOCULAR LENS PLACEMENT (IOC) LEFT;  Surgeon: Galen Manila, MD;  Location: [facility];  Service: Ophthalmology;  Laterality: Left;  6.75 00:43.9   CERVICAL CONE BIOPSY     CIS   CHOLECYSTECTOMY     COLONOSCOPY WITH PROPOFOL N/A 07/19/2016   Procedure: COLONOSCOPY WITH PROPOFOL;  Surgeon: Scot Jun, MD;  Location: [facility];  Service: Endoscopy;  Laterality: N/A;   COLONOSCOPY WITH PROPOFOL N/A 10/20/2018   Procedure: COLONOSCOPY WITH PROPOFOL;  Surgeon: Christena Deem, MD;  Location: [facility];  Service: Endoscopy;  Laterality: N/A;   COLONOSCOPY WITH PROPOFOL N/A 01/11/2023   Procedure: COLONOSCOPY WITH PROPOFOL;  Surgeon: Regis Bill, MD;  Location: [facility];  Service: Endoscopy;  Laterality: N/A;   ESOPHAGOGASTRODUODENOSCOPY N/A 05/12/2021   Procedure: ESOPHAGOGASTRODUODENOSCOPY (EGD);  Surgeon: Regis Bill, MD;  Location: [facility] ENDOSCOPY;  Service: Endoscopy;  Laterality: N/A;   ESOPHAGOGASTRODUODENOSCOPY (EGD) WITH PROPOFOL N/A 07/19/2016   Procedure: ESOPHAGOGASTRODUODENOSCOPY (EGD) WITH PROPOFOL;  Surgeon: Scot Jun, MD;  Location: [facility] END[facility]COPY;  Service: Endoscopy;  Laterality: N/A;   FLEXIBLE SIGMOIDOSCOPY     KNEE ARTHROSCOPY  08/13/2008   knee replacement and revision     left   RECTOCELE REPAIR     RIGHT/LEFT HEART CATH AND CORONARY ANGIOGRAPHY Bilateral 04/10/2018   Procedure: RIGHT/LEFT HEART CATH AND CORONARY ANGIOGRAPHY;  Surgeon: Iran Ouch, MD;  Location: [facility];  Service: Cardiovascular;  Laterality: Bilateral;   ROTATOR CUFF REPAIR     bilateral   SHOULDER  SURGERY  11/17/2005   TONSILLECTOMY  1962   VAGINAL HYSTERECTOMY  1974   abnormal pap and carcinoma in situ    Allergies: Allergies as of 06/23/2023 - Review Complete 06/15/2023  Allergen Reaction Noted   Levaquin [levofloxacin]  08/31/2018   Augmentin [amoxicillin-pot clavulanate] Other (See Comments) 08/21/2012   Bactrim [sulfamethoxazole-trimethoprim] Rash 08/21/2012   Doxycycline Itching 09/23/2016   Hydrocodone-acetaminophen Other (See Comments) 08/21/2012   Pseudoephedrine Rash 08/21/2012   Shellfish allergy Nausea And Vomiting 04/04/2018    Medications:  Current Outpatient Medications:    acetaminophen (TYLENOL) 500 MG tablet, Take 500 mg by mouth every 6 (six) hours as needed., Disp: , Rfl:    albuterol (VENTOLIN HFA) 108 (90 Base) MCG/ACT inhaler, Inhale 2 puffs into the lungs every 4 (four) hours as needed for wheezing or shortness of breath., Disp: 1 each, Rfl: 0   anastrozole (ARIMIDEX) 1 MG tablet, Take 1 tablet (1 mg total) by mouth daily., Disp: 90 tablet, Rfl: 3   aspirin 325 MG tablet, Take 325 mg by mouth daily., Disp: , Rfl:    budesonide-formoterol (SYMBICORT) 80-4.5 MCG/ACT inhaler, Inhale 2 puffs into the lungs 2 (two) times daily., Disp: 1 each, Rfl: 3   clobetasol cream (TEMOVATE) 0.05 %, Apply 1 Application topically 2 (two) times daily., Disp:  30 g, Rfl: 0   Evolocumab (REPATHA) 140 MG/ML SOSY, INJECT 140 MG UNDER THE SKIN EVERY 14 DAYS, Disp: 6 mL, Rfl: 3   furosemide (LASIX) 20 MG tablet, Take 1 tablet (20 mg) by mouth once daily as needed for weight gain of 3 lbs or more overnight, Disp: , Rfl:    linaclotide (LINZESS) 290 MCG CAPS capsule, Take 290 mcg by mouth daily before breakfast., Disp: , Rfl:    metoprolol succinate (TOPROL-XL) 100 MG 24 hr tablet, TAKE 1 TABLET BY MOUTH DAILY, Disp: 90 tablet, Rfl: 3   Omega-3 Fatty Acids (FISH OIL) 1000 MG CAPS, Take 1,000 mg by mouth., Disp: , Rfl:    ondansetron (ZOFRAN) 4 MG tablet, Take 1 tablet (4 mg total)  by mouth 2 (two) times daily as needed for nausea or vomiting., Disp: 15 tablet, Rfl: 0   potassium chloride (KLOR-CON) 10 MEQ tablet, Take 1 tablet (10 meq) by mouth once daily as needed only when taking lasix (furosemide), Disp: , Rfl:    pregabalin (LYRICA) 50 MG capsule, Take by mouth. Take 1 capsule by mouth in the morning, 1 capsule at afternoon, 2 capsules at bedtime., Disp: , Rfl:    RABEprazole (ACIPHEX) 20 MG tablet, Take 20 mg by mouth 2 (two) times daily., Disp: , Rfl:    sucralfate (CARAFATE) 1 g tablet, Take 1 g by mouth daily as needed (GI sympotms). , Disp: , Rfl:    venlafaxine XR (EFFEXOR-XR) 150 MG 24 hr capsule, Take 1 capsule (150 mg total) by mouth daily with breakfast., Disp: 90 capsule, Rfl: 3  Social History: Social History   Tobacco Use   Smoking status: Former    Current packs/day: 0.00    Average packs/day: 1 pack/day for 15.0 years (15.0 ttl pk-yrs)    Types: Cigarettes    Start date: 08/16/1968    Quit date: 08/17/1983    Years since quitting: 39.8   Smokeless tobacco: Never  Vaping Use   Vaping status: Never Used  Substance Use Topics   Alcohol use: Yes    Alcohol/week: 0.0 standard drinks of alcohol    Comment: rarely   Drug use: No    Family Medical History: Family History  Problem Relation Age of Onset   Diabetes Mellitus II Father    Thyroid disease Father    Alzheimer's disease Father    Breast cancer Maternal Aunt    Hypertension Mother    Heart Problems Brother    Leukemia Maternal Aunt    Alzheimer's disease Paternal Aunt    Alzheimer's disease Paternal Uncle    Alzheimer's disease Paternal Uncle    Alzheimer's disease Paternal Uncle    Alzheimer's disease Paternal Aunt    Colon cancer Neg Hx    Kidney cancer Neg Hx    Bladder Cancer Neg Hx     Physical Examination: There were no vitals filed for this visit.  General: Patient is in no apparent distress. Attention to examination is appropriate.  Neck:   Supple.  Full range of  motion.  Respiratory: Patient is breathing without any difficulty.   NEUROLOGICAL:     Awake, alert, oriented to person, place, and time.  Speech is clear and fluent.   Cranial Nerves: Pupils equal round and reactive to light.  Facial tone is symmetric.  Facial sensation is symmetric. Shoulder shrug is symmetric. Tongue protrusion is midline.  There is no pronator drift.  Strength: Side Biceps Triceps Deltoid Interossei Grip Wrist Ext. Wrist Flex.  R  5 5 5 5 5 5 5   L 5 5 5 5 5 5 5    Side Iliopsoas Quads Hamstring PF DF EHL  R 5 5 5 5 5 5   L 5 5 5 5 5 5    Reflexes are ***2+ and symmetric at the biceps, triceps, brachioradialis, patella and achilles.   Hoffman's is absent.   Bilateral upper and lower extremity sensation is intact to light touch.    No evidence of dysmetria noted.  Gait is normal.     Medical Decision Making  Imaging: ***  I have personally reviewed the images and agree with the above interpretation.  Assessment and Plan: Ms. Leverett is a pleasant 72 y.o. female with ***    Thank you for involving me in the care of this patient.      Chester K. Myer Haff MD, [facility] Neurosurgery

## 2023-06-22 ENCOUNTER — Inpatient Hospital Stay
Admission: RE | Admit: 2023-06-22 | Discharge: 2023-06-22 | Disposition: A | Discharge status: Home / Self Care | Payer: Self-pay | Source: Ambulatory Visit | Attending: Neurosurgery | Admitting: Neurosurgery

## 2023-06-22 ENCOUNTER — Other Ambulatory Visit: Payer: Self-pay

## 2023-06-22 DIAGNOSIS — S93491A Sprain of other ligament of right ankle, initial encounter: Secondary | ICD-10-CM | POA: Diagnosis not present

## 2023-06-22 DIAGNOSIS — Z049 Encounter for examination and observation for unspecified reason: Secondary | ICD-10-CM

## 2023-06-22 DIAGNOSIS — S82831A Other fracture of upper and lower end of right fibula, initial encounter for closed fracture: Secondary | ICD-10-CM | POA: Diagnosis not present

## 2023-06-23 ENCOUNTER — Ambulatory Visit: Payer: Medicare Other | Admitting: Neurosurgery

## 2023-06-28 ENCOUNTER — Ambulatory Visit
Admission: RE | Admit: 2023-06-28 | Discharge: 2023-06-28 | Disposition: A | Discharge status: Home / Self Care | Payer: Medicare Other | Source: Ambulatory Visit | Attending: General Surgery | Admitting: General Surgery

## 2023-06-28 DIAGNOSIS — R921 Mammographic calcification found on diagnostic imaging of breast: Secondary | ICD-10-CM | POA: Insufficient documentation

## 2023-06-28 DIAGNOSIS — R928 Other abnormal and inconclusive findings on diagnostic imaging of breast: Secondary | ICD-10-CM | POA: Insufficient documentation

## 2023-06-28 DIAGNOSIS — R92333 Mammographic heterogeneous density, bilateral breasts: Secondary | ICD-10-CM | POA: Diagnosis not present

## 2023-07-05 DIAGNOSIS — N6092 Unspecified benign mammary dysplasia of left breast: Secondary | ICD-10-CM | POA: Diagnosis not present

## 2023-07-05 DIAGNOSIS — N6091 Unspecified benign mammary dysplasia of right breast: Secondary | ICD-10-CM | POA: Diagnosis not present

## 2023-07-07 ENCOUNTER — Other Ambulatory Visit: Payer: Self-pay | Admitting: Oncology

## 2023-07-20 DIAGNOSIS — S93491D Sprain of other ligament of right ankle, subsequent encounter: Secondary | ICD-10-CM | POA: Diagnosis not present

## 2023-07-20 DIAGNOSIS — S82839A Other fracture of upper and lower end of unspecified fibula, initial encounter for closed fracture: Secondary | ICD-10-CM | POA: Diagnosis not present

## 2023-08-19 DIAGNOSIS — M79644 Pain in right finger(s): Secondary | ICD-10-CM | POA: Diagnosis not present

## 2023-08-19 DIAGNOSIS — S82839A Other fracture of upper and lower end of unspecified fibula, initial encounter for closed fracture: Secondary | ICD-10-CM | POA: Diagnosis not present

## 2023-08-19 DIAGNOSIS — S93491D Sprain of other ligament of right ankle, subsequent encounter: Secondary | ICD-10-CM | POA: Diagnosis not present

## 2023-08-19 DIAGNOSIS — S63654A Sprain of metacarpophalangeal joint of right ring finger, initial encounter: Secondary | ICD-10-CM | POA: Diagnosis not present

## 2023-08-23 ENCOUNTER — Ambulatory Visit: Payer: Medicare Other | Admitting: Neurosurgery

## 2023-08-23 ENCOUNTER — Encounter: Payer: Self-pay | Admitting: Neurosurgery

## 2023-08-23 VITALS — BP 132/84 | Ht 65.0 in | Wt 234.0 lb

## 2023-08-23 DIAGNOSIS — G609 Hereditary and idiopathic neuropathy, unspecified: Secondary | ICD-10-CM

## 2023-08-23 DIAGNOSIS — M48062 Spinal stenosis, lumbar region with neurogenic claudication: Secondary | ICD-10-CM | POA: Diagnosis not present

## 2023-08-23 NOTE — Progress Notes (Signed)
 Referring Physician:  Carlisle Benton CROME, FNP [address] [place],  [place] [address]  Primary Physician:  Glendia Shad, MD  History of Present Illness: 08/23/2023 Nicole Sims is here today with a chief complaint of low back pain that extends into her legs present on the right side.  She has pain that sometimes goes all the way down to her right ankle.  Walking and standing make it worse.  Nothing really helps too much other than sitting or resting.  She has tried conservative management without improvement. Bowel/Bladder Dysfunction: none  Conservative measures:  Physical therapy: has participated in PT at [facility]  Multimodal medical therapy including regular antiinflammatories: Tylenol , Lyrica, Tramadol  Injections: 03/14/2023: Bilateral L5-S1 transforaminal ESI (80% relief) 01/05/2023: RFA to the bilateral L4-5 and L5-S1 facet joints (no relief) 02/25/2021: RFA to the bilateral L4-5 and L5-S1 facet joints (90% relief for 10 months) 02/06/2021: MBB to the bilateral L4-5 and L5-S1 facet joints (8/10 to 0/10) 01/08/2021: MBB to the bilateral L4-5 and L5-S1 facet joints (8/10 to 1/10) 09/02/2020: Bilateral L3-4 transforaminal ESI (70% relief)  08/05/2020: Bilateral L3-4 transforaminal ESI (50% relief) 05/16/2020: Left C3-4 and left C4-5 facet joint injections (moderate relief) 06/22/2019: Bilateral L3-4 transforaminal ESI (moderate relief) 05/18/2019: Bilateral L4-5 transforaminal ESI (mild relief) 11/03/2017: Bilateral L4-5 transforaminal ESI (mild relief) 07/26/2017: Left C4-5 and C5-6 facet joint injections  04/15/2016: L4-5 transforaminal ESI 01/22/2016: Right C4-5 and C5-6 facet joint injections (75% relief) 08/01/2012: Right C4-5 and C5-6 facet joint injections (greater than 50% relief) 06/06/2012: Right C4-5 transforaminal ESI (20% relief) 05/09/2012: Right C4-5 transforaminal ESI   Past Surgery: none  Nicole Sims has no symptoms of cervical  myelopathy.  The symptoms are causing a significant impact on the patient's life.   I have utilized the care everywhere function in epic to review the outside records available from external health systems.  Review of Systems:  A 10 point review of systems is negative, except for the pertinent positives and negatives detailed in the HPI.  Past Medical History: Past Medical History:  Diagnosis Date   Anemia    Anginal pain (HCC)    Anxiety    Asthma    Cataract cortical, senile    CHF (congestive heart failure) (HCC)    Chronic headaches    Diastolic heart failure (HCC)    Diverticulosis    Environmental allergies    GERD (gastroesophageal reflux disease)    Heart murmur    Hyperglycemia    Hyperlipidemia    Hypertension    Leukocytosis    Obesity    Osteoarthritis    Palpitations    Restless leg syndrome    Sleep apnea    Stroke (HCC)    Thrombocytosis    Urinary incontinence    mixed   Venous insufficiency    Vitamin D  deficiency    Wears dentures    partial upper    Past Surgical History: Past Surgical History:  Procedure Laterality Date   BLADDER SURGERY     x2   [facility]   BREAST BIOPSY Left 06/24/2022   Stereo Bx, X-clip, neg   BREAST BIOPSY Left 06/24/2022   Stereo Bx, ribbon clip, atypical lobular hyperplasis   BREAST BIOPSY  06/24/2022   Stereo Bx, Coil Clip, neg   BREAST BIOPSY Left 06/24/2022   MM LT BREAST BX W LOC DEV 1ST LESION IMAGE BX SPEC STEREO GUIDE 06/24/2022 [facility]   BREAST BIOPSY Left 06/24/2022   MM  LT BREAST BX W LOC DEV EA AD LESION IMG BX SPEC STEREO GUIDE 06/24/2022 [facility]   BREAST BIOPSY Left 06/24/2022   MM LT BREAST BX W LOC DEV EA AD LESION IMG BX SPEC STEREO GUIDE 06/24/2022 [facility]   BREAST CYST ASPIRATION Bilateral 2005   approximate year   CARDIAC CATHETERIZATION     [place]   CATARACT EXTRACTION W/PHACO Right 04/24/2020   Procedure: CATARACT EXTRACTION PHACO AND INTRAOCULAR  LENS PLACEMENT (IOC) RIGHT;  Surgeon: Jaye Fallow, MD;  Location: [facility];  Service: Ophthalmology;  Laterality: Right;  6.75 0:37.3   CATARACT EXTRACTION W/PHACO Left 01/27/2021   Procedure: CATARACT EXTRACTION PHACO AND INTRAOCULAR LENS PLACEMENT (IOC) LEFT;  Surgeon: Jaye Fallow, MD;  Location: [facility];  Service: Ophthalmology;  Laterality: Left;  6.75 00:43.9   CERVICAL CONE BIOPSY     CIS   CHOLECYSTECTOMY     COLONOSCOPY WITH PROPOFOL  N/A 07/19/2016   Procedure: COLONOSCOPY WITH PROPOFOL ;  Surgeon: Lamar ONEIDA Holmes, MD;  Location: [facility];  Service: Endoscopy;  Laterality: N/A;   COLONOSCOPY WITH PROPOFOL  N/A 10/20/2018   Procedure: COLONOSCOPY WITH PROPOFOL ;  Surgeon: Gaylyn Gladis PENNER, MD;  Location: [facility];  Service: Endoscopy;  Laterality: N/A;   COLONOSCOPY WITH PROPOFOL  N/A 01/11/2023   Procedure: COLONOSCOPY WITH PROPOFOL ;  Surgeon: Maryruth Ole ONEIDA, MD;  Location: [facility] ENDOSCOPY;  Service: Endoscopy;  Laterality: N/A;   ESOPHAGOGASTRODUODENOSCOPY N/A 05/12/2021   Procedure: ESOPHAGOGASTRODUODENOSCOPY (EGD);  Surgeon: Maryruth Ole ONEIDA, MD;  Location: [facility] ENDOSCOPY;  Service: Endoscopy;  Laterality: N/A;   ESOPHAGOGASTRODUODENOSCOPY (EGD) WITH PROPOFOL  N/A 07/19/2016   Procedure: ESOPHAGOGASTRODUODENOSCOPY (EGD) WITH PROPOFOL ;  Surgeon: Lamar ONEIDA Holmes, MD;  Location: [facility];  Service: Endoscopy;  Laterality: N/A;   FLEXIBLE SIGMOIDOSCOPY     KNEE ARTHROSCOPY  08/13/2008   knee replacement and revision     left   RECTOCELE REPAIR     RIGHT/LEFT HEART CATH AND CORONARY ANGIOGRAPHY Bilateral 04/10/2018   Procedure: RIGHT/LEFT HEART CATH AND CORONARY ANGIOGRAPHY;  Surgeon: Darron Deatrice LABOR, MD;  Location: [facility];  Service: Cardiovascular;  Laterality: Bilateral;   ROTATOR CUFF REPAIR     bilateral   SHOULDER SURGERY  11/17/2005   TONSILLECTOMY  1962   VAGINAL HYSTERECTOMY  1974   abnormal pap and  carcinoma in situ    Allergies: Allergies as of 08/23/2023 - Review Complete 08/23/2023  Allergen Reaction Noted   Levaquin  [levofloxacin ]  08/31/2018   Augmentin [amoxicillin-pot clavulanate] Other (See Comments) 08/21/2012   Bactrim [sulfamethoxazole-trimethoprim ] Rash 08/21/2012   Doxycycline  Itching 09/23/2016   Hydrocodone-acetaminophen  Other (See Comments) 08/21/2012   Pseudoephedrine Rash 08/21/2012   Shellfish allergy Nausea And Vomiting 04/04/2018    Medications:  Current Outpatient Medications:    acetaminophen  (TYLENOL ) 500 MG tablet, Take 500 mg by mouth every 6 (six) hours as needed., Disp: , Rfl:    anastrozole  (ARIMIDEX ) 1 MG tablet, Take 1 tablet (1 mg total) by mouth daily., Disp: 90 tablet, Rfl: 3   aspirin  325 MG tablet, Take 325 mg by mouth daily., Disp: , Rfl:    budesonide -formoterol  (SYMBICORT ) 80-4.5 MCG/ACT inhaler, Inhale 2 puffs into the lungs 2 (two) times daily., Disp: 1 each, Rfl: 3   clobetasol  cream (TEMOVATE ) 0.05 %, Apply 1 Application topically 2 (two) times daily., Disp: 30 g, Rfl: 0   Evolocumab  (REPATHA ) 140 MG/ML SOSY, INJECT 140 MG UNDER THE SKIN EVERY 14 DAYS, Disp: 6 mL, Rfl: 3   linaclotide  (LINZESS ) 290 MCG CAPS capsule,  Take 290 mcg by mouth daily before breakfast., Disp: , Rfl:    metoprolol  succinate (TOPROL -XL) 100 MG 24 hr tablet, TAKE 1 TABLET BY MOUTH DAILY, Disp: 90 tablet, Rfl: 3   Omega-3 Fatty Acids (FISH OIL) 1000 MG CAPS, Take 1,000 mg by mouth., Disp: , Rfl:    ondansetron  (ZOFRAN ) 4 MG tablet, Take 1 tablet (4 mg total) by mouth 2 (two) times daily as needed for nausea or vomiting., Disp: 15 tablet, Rfl: 0   pregabalin (LYRICA) 50 MG capsule, Take by mouth. Take 1 capsule by mouth in the morning, 1 capsule at afternoon, 2 capsules at bedtime., Disp: , Rfl:    RABEprazole (ACIPHEX) 20 MG tablet, Take 20 mg by mouth 2 (two) times daily., Disp: , Rfl:    traMADol (ULTRAM) 50 MG tablet, Take 1 tablet by mouth 2 (two) times daily as  needed., Disp: , Rfl:    venlafaxine  XR (EFFEXOR -XR) 150 MG 24 hr capsule, Take 1 capsule (150 mg total) by mouth daily with breakfast., Disp: 90 capsule, Rfl: 3   albuterol  (VENTOLIN  HFA) 108 (90 Base) MCG/ACT inhaler, Inhale 2 puffs into the lungs every 4 (four) hours as needed for wheezing or shortness of breath. (Patient not taking: Reported on 08/23/2023), Disp: 1 each, Rfl: 0   furosemide  (LASIX ) 20 MG tablet, Take 1 tablet (20 mg) by mouth once daily as needed for weight gain of 3 lbs or more overnight (Patient not taking: Reported on 08/23/2023), Disp: , Rfl:    potassium chloride  (KLOR-CON ) 10 MEQ tablet, Take 1 tablet (10 meq) by mouth once daily as needed only when taking lasix  (furosemide ) (Patient not taking: Reported on 08/23/2023), Disp: , Rfl:    sucralfate  (CARAFATE ) 1 g tablet, Take 1 g by mouth daily as needed (GI sympotms).  (Patient not taking: Reported on 08/23/2023), Disp: , Rfl:   Social History: Social History   Tobacco Use   Smoking status: Former    Current packs/day: 0.00    Average packs/day: 1 pack/day for 15.0 years (15.0 ttl pk-yrs)    Types: Cigarettes    Start date: 08/16/1968    Quit date: 08/17/1983    Years since quitting: 40.0   Smokeless tobacco: Never  Vaping Use   Vaping status: Never Used  Substance Use Topics   Alcohol use: Yes    Alcohol/week: 0.0 standard drinks of alcohol    Comment: rarely   Drug use: No    Family Medical History: Family History  Problem Relation Age of Onset   Diabetes Mellitus II Father    Thyroid  disease Father    Alzheimer's disease Father    Breast cancer Maternal Aunt    Hypertension Mother    Heart Problems Brother    Leukemia Maternal Aunt    Alzheimer's disease Paternal Aunt    Alzheimer's disease Paternal Uncle    Alzheimer's disease Paternal Uncle    Alzheimer's disease Paternal Uncle    Alzheimer's disease Paternal Aunt    Colon cancer Neg Hx    Kidney cancer Neg Hx    Bladder Cancer Neg Hx     Physical  Examination: Vitals:   08/23/23 1502  BP: 132/84    General: Patient is in no apparent distress. Attention to examination is appropriate.  Neck:   Supple.  Full range of motion.  Respiratory: Patient is breathing without any difficulty.   NEUROLOGICAL:     Awake, alert, oriented to person, place, and time.  Speech is clear and fluent.  Cranial Nerves: Pupils equal round and reactive to light.  Facial tone is symmetric.  Facial sensation is symmetric. Shoulder shrug is symmetric. Tongue protrusion is midline.  There is no pronator drift.  Strength: Side Biceps Triceps Deltoid Interossei Grip Wrist Ext. Wrist Flex.  R 5 5 5 5 5 5 5   L 5 5 5 5 5 5 5    Side Iliopsoas Quads Hamstring PF DF EHL  R 5 5 5 5 5 5   L 5 5 5 5 5 5    Reflexes are 1+ and symmetric at the biceps, triceps, brachioradialis, patella and achilles.   Hoffman's is absent.   Bilateral upper and lower extremity sensation is symmetric to light touch.    No evidence of dysmetria noted.  Gait is antalgic and requires a cane.     Medical Decision Making  Imaging: MRI L spine 04/20/2023 Disc levels:   Disc spaces: Degenerative disease with disc height loss at L3-4. Disc desiccation and mild disc height loss at L1-2 and L2-3.   T12-L1: No significant disc bulge. No neural foraminal stenosis. No central canal stenosis.   L1-L2: Mild broad-based disc bulge. Mild bilateral facet arthropathy. No foraminal or central canal stenosis.   L2-L3: Mild broad-based disc bulge. Mild bilateral facet arthropathy. No foraminal or central canal stenosis.   L3-L4: Broad-based disc bulge. Mild bilateral facet arthropathy. Mild spinal stenosis. No foraminal stenosis.   L4-L5: Mild broad-based disc bulge. Moderate bilateral facet arthropathy. Moderate spinal stenosis. No foraminal stenosis.   L5-S1: No significant disc bulge. Moderate bilateral facet arthropathy. No foraminal or central canal stenosis.   IMPRESSION: 1.  Lumbar spine spondylosis as described above. 2. No acute osseous injury of the lumbar spine.     Electronically Signed   By: Julaine Blanch M.D.   On: 05/06/2023 11:40  I have personally reviewed the images and agree with the above interpretation.  Assessment and Plan: Nicole Sims is a pleasant 73 y.o. female with peripheral neuropathy with neuropathic pain in her legs as well as symptoms of neurogenic claudication.  She has moderate stenosis at L4-5.  We reviewed the options.  I think her options include spinal cord stimulation versus L4-5 decompression.  Have given her information about spinal cord stimulation.  I have been asked her to repeat this and think about which direction she would like to go.  She will let me know if she would like to proceed with spinal cord stimulator trial versus decompression.  I did share with her that I felt that her compression at L4-5 was not quite as severe as the radiologist felt.  That being said, I have to defer to radiology given their expertise.  We will move forward with whichever intervention she chooses.  I spent a total of 30 minutes in this patient's care today. This time was spent reviewing pertinent records including imaging studies, obtaining and confirming history, performing a directed evaluation, formulating and discussing my recommendations, and documenting the visit within the medical record.    Thank you for involving me in the care of this patient.      Shoichi Mielke K. Clois MD, [facility]

## 2023-08-30 ENCOUNTER — Telehealth: Payer: Self-pay | Admitting: Neurosurgery

## 2023-08-30 NOTE — Telephone Encounter (Signed)
 I spoke with Nicole Sims at length about the referrals for a spinal cord stimulator (MRI thoracic spine, a psych eval, and referral to the pain clinic for a trial). I provided her with the codes so she can speak with [facility] about potential costs if she chooses to do so.  During our conversation, she mentioned she has [employer]. We discussed the coverage policy for [facility] for spinal cord stimulators:  Complex regional pain syndrome (CRPS) Painful lower limb diabetic neuropathy Failed back surgery syndrome  She confirmed that she has never had back surgery and she does not have diabetes. She has an ankle injury last year, but currently does not have any residual pain. Based on this, I do not think that [facility] would cover a spinal cord stimulator. I informed her that I will speak with Dr Clois to see if he thinks that surgery is the next best option for her or if he has alternative suggestions. She states she has already tried PT and injections.

## 2023-08-30 NOTE — Telephone Encounter (Signed)
 She saw Dr.Yarbrough last week. She wants to move forward with the SCS.  She was looking up information about the SCS and it seems like its expensive. Do you know how  much her insurance will cover, or how can she find out? What would the next step be to move forward with the SCS?

## 2023-08-31 NOTE — Telephone Encounter (Signed)
 I spoke with Nicole Sims and relayed Dr Chrystal Crape response and recommendations. Her brother is currently in the ICU, so she would like to put this on hold at this time. She will call us  if/when she is ready to move forward.

## 2023-09-07 ENCOUNTER — Encounter: Payer: Self-pay | Admitting: Internal Medicine

## 2023-09-07 NOTE — Telephone Encounter (Signed)
We did discuss this at her appt and Dr Orlie Dakin had put this in his note, but I had informed her that with this type of medication, I would prefer this be refilled and followed by cancer center.  Sorry for any confusion.

## 2023-09-07 NOTE — Telephone Encounter (Signed)
See previous note.  She also needs f/u with Dr Feliberto Gottron for lichen sclerosis. She was seeing him and appears to be overdue.

## 2023-09-08 ENCOUNTER — Telehealth: Payer: Self-pay | Admitting: Cardiovascular Disease

## 2023-09-08 DIAGNOSIS — E782 Mixed hyperlipidemia: Secondary | ICD-10-CM

## 2023-09-08 DIAGNOSIS — I700 Atherosclerosis of aorta: Secondary | ICD-10-CM

## 2023-09-08 NOTE — Telephone Encounter (Signed)
Pt c/o medication issue:  1. Name of Medication: Evolocumab (REPATHA) 140 MG/ML SOSY   2. How are you currently taking this medication (dosage and times per day)? As written  3. Are you having a reaction (difficulty breathing--STAT)? No   4. What is your medication issue? Pharmacy told her she will need a new script because the company that was making it is no longer making it

## 2023-09-08 NOTE — Telephone Encounter (Signed)
Will route to MD to advise if we can switch to the [employer] order-   Thank you!

## 2023-09-08 NOTE — Telephone Encounter (Signed)
Returned the call to the patient for clarification. The patient only needs refill on her current prescription. The pharmacy has been called and the refill is being completed.

## 2023-09-09 ENCOUNTER — Other Ambulatory Visit: Payer: Self-pay | Admitting: *Deleted

## 2023-09-09 ENCOUNTER — Telehealth: Payer: Self-pay | Admitting: *Deleted

## 2023-09-09 DIAGNOSIS — Z79899 Other long term (current) drug therapy: Secondary | ICD-10-CM

## 2023-09-09 DIAGNOSIS — N6099 Unspecified benign mammary dysplasia of unspecified breast: Secondary | ICD-10-CM

## 2023-09-09 DIAGNOSIS — Z780 Asymptomatic menopausal state: Secondary | ICD-10-CM

## 2023-09-09 MED ORDER — ANASTROZOLE 1 MG PO TABS: 1.0000 mg | ORAL_TABLET | Freq: Every day | ORAL | 3 refills | Status: DC

## 2023-09-09 NOTE — Progress Notes (Signed)
Appt and bone density. Pt aware of it and abbie will make an appt for it

## 2023-09-09 NOTE — Telephone Encounter (Signed)
Patient called and said that she needed a refill of her anastrozole and at the last visit from Dr. Orlie Dakin he said that it would be okay for Dr. Lorin Picket to continue the medicine.  The patient sent a message to Dr. Lorin Picket and she said because of the diagnosis she feels like that the patient needs to stay follow-up on the anastrozole.  Dr. Orlie Dakin said to go ahead and get the anastrozole prescription and will get a appointment for the patient for future.  Patient is good with this

## 2023-09-14 ENCOUNTER — Other Ambulatory Visit: Payer: Self-pay | Admitting: Internal Medicine

## 2023-09-19 ENCOUNTER — Telehealth: Payer: Self-pay | Admitting: *Deleted

## 2023-09-19 NOTE — Telephone Encounter (Signed)
Patient called and said that the she was supposed to get a bone density from send again and when they go to try to do that says that it is only done every 2 years and if the patient still needs 1 whether it 1 or 2 years they would have to call and get an off in order for her to then get set up.  She would like a call back about what we are going to do.

## 2023-09-27 DIAGNOSIS — J358 Other chronic diseases of tonsils and adenoids: Secondary | ICD-10-CM | POA: Diagnosis not present

## 2023-09-27 DIAGNOSIS — Z03818 Encounter for observation for suspected exposure to other biological agents ruled out: Secondary | ICD-10-CM | POA: Diagnosis not present

## 2023-09-27 DIAGNOSIS — J069 Acute upper respiratory infection, unspecified: Secondary | ICD-10-CM | POA: Diagnosis not present

## 2023-09-29 DIAGNOSIS — S63654A Sprain of metacarpophalangeal joint of right ring finger, initial encounter: Secondary | ICD-10-CM | POA: Diagnosis not present

## 2023-09-29 DIAGNOSIS — M65332 Trigger finger, left middle finger: Secondary | ICD-10-CM | POA: Diagnosis not present

## 2023-10-06 ENCOUNTER — Other Ambulatory Visit: Payer: Medicare Other

## 2023-10-10 ENCOUNTER — Encounter: Payer: Self-pay | Admitting: Oncology

## 2023-10-11 ENCOUNTER — Ambulatory Visit: Payer: Medicare Other | Admitting: Internal Medicine

## 2023-10-11 VITALS — BP 128/72 | HR 78 | Temp 98.0°F | Resp 16 | Ht 65.0 in | Wt 230.4 lb

## 2023-10-11 DIAGNOSIS — N6099 Unspecified benign mammary dysplasia of unspecified breast: Secondary | ICD-10-CM

## 2023-10-11 DIAGNOSIS — I10 Essential (primary) hypertension: Secondary | ICD-10-CM | POA: Diagnosis not present

## 2023-10-11 DIAGNOSIS — T466X5A Adverse effect of antihyperlipidemic and antiarteriosclerotic drugs, initial encounter: Secondary | ICD-10-CM

## 2023-10-11 DIAGNOSIS — D473 Essential (hemorrhagic) thrombocythemia: Secondary | ICD-10-CM | POA: Diagnosis not present

## 2023-10-11 DIAGNOSIS — R739 Hyperglycemia, unspecified: Secondary | ICD-10-CM

## 2023-10-11 DIAGNOSIS — E7800 Pure hypercholesterolemia, unspecified: Secondary | ICD-10-CM

## 2023-10-11 DIAGNOSIS — M545 Low back pain, unspecified: Secondary | ICD-10-CM

## 2023-10-11 DIAGNOSIS — I700 Atherosclerosis of aorta: Secondary | ICD-10-CM | POA: Diagnosis not present

## 2023-10-11 DIAGNOSIS — I5033 Acute on chronic diastolic (congestive) heart failure: Secondary | ICD-10-CM | POA: Diagnosis not present

## 2023-10-11 DIAGNOSIS — I5030 Unspecified diastolic (congestive) heart failure: Secondary | ICD-10-CM

## 2023-10-11 DIAGNOSIS — K219 Gastro-esophageal reflux disease without esophagitis: Secondary | ICD-10-CM

## 2023-10-11 DIAGNOSIS — G720 Drug-induced myopathy: Secondary | ICD-10-CM

## 2023-10-11 DIAGNOSIS — I471 Supraventricular tachycardia, unspecified: Secondary | ICD-10-CM | POA: Diagnosis not present

## 2023-10-11 DIAGNOSIS — F439 Reaction to severe stress, unspecified: Secondary | ICD-10-CM

## 2023-10-11 LAB — HEPATIC FUNCTION PANEL
ALT: 9 U/L (ref 0–35)
AST: 14 U/L (ref 0–37)
Albumin: 4 g/dL (ref 3.5–5.2)
Alkaline Phosphatase: 95 U/L (ref 39–117)
Bilirubin, Direct: 0.1 mg/dL (ref 0.0–0.3)
Total Bilirubin: 0.4 mg/dL (ref 0.2–1.2)
Total Protein: 7.8 g/dL (ref 6.0–8.3)

## 2023-10-11 LAB — BASIC METABOLIC PANEL
BUN: 12 mg/dL (ref 6–23)
CO2: 26 meq/L (ref 19–32)
Calcium: 9.3 mg/dL (ref 8.4–10.5)
Chloride: 107 meq/L (ref 96–112)
Creatinine, Ser: 1.09 mg/dL (ref 0.40–1.20)
GFR: 50.41 mL/min — ABNORMAL LOW (ref >60.00)
Glucose, Bld: 97 mg/dL (ref 70–99)
Potassium: 4.5 meq/L (ref 3.5–5.1)
Sodium: 141 meq/L (ref 135–145)

## 2023-10-11 LAB — LIPID PANEL
Cholesterol: 166 mg/dL (ref 0–200)
HDL: 57.6 mg/dL (ref >39.00)
LDL Cholesterol: 83 mg/dL (ref 0–99)
NonHDL: 108.11
Total CHOL/HDL Ratio: 3
Triglycerides: 127 mg/dL (ref 0.0–149.0)
VLDL: 25.4 mg/dL (ref 0.0–40.0)

## 2023-10-11 LAB — CBC WITH DIFFERENTIAL/PLATELET
Basophils Absolute: 0.1 10*3/uL (ref 0.0–0.1)
Basophils Relative: 1.2 % (ref 0.0–3.0)
Eosinophils Absolute: 0.1 10*3/uL (ref 0.0–0.7)
Eosinophils Relative: 2.2 % (ref 0.0–5.0)
HCT: 38.6 % (ref 36.0–46.0)
Hemoglobin: 12.5 g/dL (ref 12.0–15.0)
Lymphocytes Relative: 43.5 % (ref 12.0–46.0)
Lymphs Abs: 2.8 10*3/uL (ref 0.7–4.0)
MCHC: 32.5 g/dL (ref 30.0–36.0)
MCV: 87.5 fL (ref 78.0–100.0)
Monocytes Absolute: 0.5 10*3/uL (ref 0.1–1.0)
Monocytes Relative: 7.1 % (ref 3.0–12.0)
Neutro Abs: 3 10*3/uL (ref 1.4–7.7)
Neutrophils Relative %: 46 % (ref 43.0–77.0)
Platelets: 439 10*3/uL — ABNORMAL HIGH (ref 150.0–400.0)
RBC: 4.41 Mil/uL (ref 3.87–5.11)
RDW: 14 % (ref 11.5–15.5)
WBC: 6.4 10*3/uL (ref 4.0–10.5)

## 2023-10-11 LAB — HEMOGLOBIN A1C: Hgb A1c MFr Bld: 6 % (ref 4.6–6.5)

## 2023-10-11 NOTE — Progress Notes (Signed)
 Subjective:    Patient ID: Nicole Sims, female    DOB: February 06, 1951, 73 y.o.   MRN: [id number]  Patient here for  Chief Complaint  Patient presents with   Medical Management of Chronic Issues    HPI Here for a scheduled follow up. Seeing ortho. Last seen 09/29/23 - f/u left middle trigger finger. Planning for trigger finger release. Also splinting - right ring finger. Seeing Dr Myer Haff - last 08/23/23 - f/u peripheral neuropathy with neuropathic pain - moderate stenosis L4-5. He discussed treatment options - spinal cord stimulator vs L4-5 decompression. Had f/u with Dr Peggye Form. - f/u atypical lobular hyperplasia - both breasts. Last evlauated 07/05/23. Recommended f/u mammo in 6 months and to continue f/u with oncology. On anastrozole. Had colonoscopy 01/11/23 - fair prep. Recommended f/u colonoscopy in 2 years. Had f/u with Dr Kirke Corin 12/28/22 - paroxysmal SVT - continue toprol 100mg  q day. Stable. F/u chronic diastolic heart failure. Per note, not taking lasix. Continues on repatha. Reports ears ringing. Some popping at times. Had hearing checked last year. Some runny nose. Intermittent cough. No significant chest congestion or cough. Acid reflux controlled if takes medication and if watches diet. Increased stress with husband's health issues.  Breathing stable.    Past Medical History:  Diagnosis Date   Anemia    Anginal pain (HCC)    Anxiety    Asthma    Cataract cortical, senile    CHF (congestive heart failure) (HCC)    Chronic headaches    Diastolic heart failure (HCC)    Diverticulosis    Environmental allergies    GERD (gastroesophageal reflux disease)    Heart murmur    Hyperglycemia    Hyperlipidemia    Hypertension    Leukocytosis    Obesity    Osteoarthritis    Palpitations    Restless leg syndrome    Sleep apnea    Stroke (HCC)    Thrombocytosis    Urinary incontinence    mixed   Venous insufficiency    Vitamin D deficiency    Wears dentures    partial upper    Past Surgical History:  Procedure Laterality Date   BLADDER SURGERY     x2   [facility]   BREAST BIOPSY Left 06/24/2022   Stereo Bx, X-clip, neg   BREAST BIOPSY Left 06/24/2022   Stereo Bx, ribbon clip, atypical lobular hyperplasis   BREAST BIOPSY  06/24/2022   Stereo Bx, Coil Clip, neg   BREAST BIOPSY Left 06/24/2022   MM LT BREAST BX W LOC DEV 1ST LESION IMAGE BX SPEC STEREO GUIDE 06/24/2022 [facility]   BREAST BIOPSY Left 06/24/2022   MM LT BREAST BX W LOC DEV EA AD LESION IMG BX SPEC STEREO GUIDE 06/24/2022 [facility]   BREAST BIOPSY Left 06/24/2022   MM LT BREAST BX W LOC DEV EA AD LESION IMG BX SPEC STEREO GUIDE 06/24/2022 [facility]   BREAST CYST ASPIRATION Bilateral 2005   approximate year   CARDIAC CATHETERIZATION     [name]   CATARACT EXTRACTION W/PHACO Right 04/24/2020   Procedure: CATARACT EXTRACTION PHACO AND INTRAOCULAR LENS PLACEMENT (IOC) RIGHT;  Surgeon: Galen Manila, MD;  Location: [facility];  Service: Ophthalmology;  Laterality: Right;  6.75 0:37.3   CATARACT EXTRACTION W/PHACO Left 01/27/2021   Procedure: CATARACT EXTRACTION PHACO AND INTRAOCULAR LENS PLACEMENT (IOC) LEFT;  Surgeon: Galen Manila, MD;  Location: [facility];  Service: Ophthalmology;  Laterality: Left;  6.75 00:43.9   CERVICAL  CONE BIOPSY     CIS   CHOLECYSTECTOMY     COLONOSCOPY WITH PROPOFOL N/A 07/19/2016   Procedure: COLONOSCOPY WITH PROPOFOL;  Surgeon: Scot Jun, MD;  Location: [facility];  Service: Endoscopy;  Laterality: N/A;   COLONOSCOPY WITH PROPOFOL N/A 10/20/2018   Procedure: COLONOSCOPY WITH PROPOFOL;  Surgeon: Christena Deem, MD;  Location: [facility];  Service: Endoscopy;  Laterality: N/A;   COLONOSCOPY WITH PROPOFOL N/A 01/11/2023   Procedure: COLONOSCOPY WITH PROPOFOL;  Surgeon: Regis Bill, MD;  Location: [facility];  Service: Endoscopy;  Laterality: N/A;   ESOPHAGOGASTRODUODENOSCOPY N/A  05/12/2021   Procedure: ESOPHAGOGASTRODUODENOSCOPY (EGD);  Surgeon: Regis Bill, MD;  Location: [facility];  Service: Endoscopy;  Laterality: N/A;   ESOPHAGOGASTRODUODENOSCOPY (EGD) WITH PROPOFOL N/A 07/19/2016   Procedure: ESOPHAGOGASTRODUODENOSCOPY (EGD) WITH PROPOFOL;  Surgeon: Scot Jun, MD;  Location: [facility];  Service: Endoscopy;  Laterality: N/A;   FLEXIBLE SIGMOIDOSCOPY     KNEE ARTHROSCOPY  08/13/2008   knee replacement and revision     left   RECTOCELE REPAIR     RIGHT/LEFT HEART CATH AND CORONARY ANGIOGRAPHY Bilateral 04/10/2018   Procedure: RIGHT/LEFT HEART CATH AND CORONARY ANGIOGRAPHY;  Surgeon: Iran Ouch, MD;  Location: [facility];  Service: Cardiovascular;  Laterality: Bilateral;   ROTATOR CUFF REPAIR     bilateral   SHOULDER SURGERY  11/17/2005   TONSILLECTOMY  1962   VAGINAL HYSTERECTOMY  1974   abnormal pap and carcinoma in situ   Family History  Problem Relation Age of Onset   Diabetes Mellitus II Father    Thyroid disease Father    Alzheimer's disease Father    Breast cancer Maternal Aunt    Hypertension Mother    Heart Problems Brother    Leukemia Maternal Aunt    Alzheimer's disease Paternal Aunt    Alzheimer's disease Paternal Uncle    Alzheimer's disease Paternal Uncle    Alzheimer's disease Paternal Uncle    Alzheimer's disease Paternal Aunt    Colon cancer Neg Hx    Kidney cancer Neg Hx    Bladder Cancer Neg Hx    Social History   Socioeconomic History   Marital status: Married    Spouse name: Not on file   Number of children: Not on file   Years of education: Not on file   Highest education level: 11th grade  Occupational History   Occupation: retired  Tobacco Use   Smoking status: Former    Current packs/day: 0.00    Average packs/day: 1 pack/day for 15.0 years (15.0 ttl pk-yrs)    Types: Cigarettes    Start date: 08/16/1968    Quit date: 08/17/1983    Years since quitting: 40.1   Smokeless  tobacco: Never  Vaping Use   Vaping status: Never Used  Substance and Sexual Activity   Alcohol use: Yes    Alcohol/week: 0.0 standard drinks of alcohol    Comment: rarely   Drug use: No   Sexual activity: Not on file  Other Topics Concern   Not on file  Social History Narrative   Lives at home with husband   Social Drivers of Health   Financial Resource Strain: Low Risk  (10/10/2023)   Overall Financial Resource Strain (CARDIA)    Difficulty of Paying Living Expenses: Not very hard  Food Insecurity: No Food Insecurity (10/10/2023)   Hunger Vital Sign    Worried About Running Out of Food in the Last Year: Never true  Ran Out of Food in the Last Year: Never true  Transportation Needs: No Transportation Needs (10/10/2023)   PRAPARE - Administrator, Civil Service (Medical): No    Lack of Transportation (Non-Medical): No  Physical Activity: Insufficiently Active (10/10/2023)   Exercise Vital Sign    Days of Exercise per Week: 3 days    Minutes of Exercise per Session: 20 min  Stress: No Stress Concern Present (10/10/2023)   [employer] of Occupational Health - Occupational Stress Questionnaire    Feeling of Stress : Only a little  Social Connections: Socially Integrated (10/10/2023)   Social Connection and Isolation Panel [NHANES]    Frequency of Communication with Friends and Family: More than three times a week    Frequency of Social Gatherings with Friends and Family: Once a week    Attends Religious Services: More than 4 times per year    Active Member of [employer] or Organizations: No    Attends Engineer, structural: More than 4 times per year    Marital Status: Married     Review of Systems  Constitutional:  Negative for appetite change and unexpected weight change.  HENT:  Negative for sinus pressure.        Ears - some ringing, popping. Runny nose.   Respiratory:  Negative for chest tightness and shortness of breath.        Some cough.    Cardiovascular:  Negative for chest pain, palpitations and leg swelling.  Gastrointestinal:  Negative for diarrhea, nausea and vomiting.       Acid reflux - if forgets medication.   Genitourinary:  Negative for difficulty urinating and dysuria.  Musculoskeletal:  Negative for joint swelling and myalgias.  Skin:  Negative for color change and rash.  Neurological:  Negative for dizziness and headaches.  Psychiatric/Behavioral:  Negative for agitation and dysphoric mood.        Objective:     BP 128/72   Pulse 78   Temp 98 F (36.7 C)   Resp 16   Ht 5\' 5"  (1.651 m)   Wt 230 lb 6.4 oz (104.5 kg)   SpO2 98%   BMI 38.34 kg/m  Wt Readings from Last 3 Encounters:  10/11/23 230 lb 6.4 oz (104.5 kg)  08/23/23 234 lb (106.1 kg)  06/15/23 235 lb (106.6 kg)    Physical Exam Vitals reviewed.  Constitutional:      General: She is not in acute distress.    Appearance: Normal appearance.  HENT:     Head: Normocephalic and atraumatic.     Right Ear: External ear normal.     Left Ear: External ear normal.  Eyes:     General: No scleral icterus.       Right eye: No discharge.        Left eye: No discharge.     Conjunctiva/sclera: Conjunctivae normal.  Neck:     Thyroid: No thyromegaly.  Cardiovascular:     Rate and Rhythm: Normal rate and regular rhythm.  Pulmonary:     Effort: No respiratory distress.     Breath sounds: Normal breath sounds. No wheezing.  Abdominal:     General: Bowel sounds are normal.     Palpations: Abdomen is soft.     Tenderness: There is no abdominal tenderness.  Musculoskeletal:        General: No swelling or tenderness.     Cervical back: Neck supple. No tenderness.  Lymphadenopathy:     Cervical:  No cervical adenopathy.  Skin:    Findings: No erythema or rash.  Neurological:     Mental Status: She is alert.  Psychiatric:        Mood and Affect: Mood normal.        Behavior: Behavior normal.         Outpatient Encounter Medications as  of 10/11/2023  Medication Sig   acetaminophen (TYLENOL) 500 MG tablet Take 500 mg by mouth every 6 (six) hours as needed.   albuterol (VENTOLIN HFA) 108 (90 Base) MCG/ACT inhaler Inhale 2 puffs into the lungs every 4 (four) hours as needed for wheezing or shortness of breath. (Patient not taking: Reported on 08/23/2023)   anastrozole (ARIMIDEX) 1 MG tablet Take 1 tablet (1 mg total) by mouth daily.   aspirin 325 MG tablet Take 325 mg by mouth daily.   budesonide-formoterol (SYMBICORT) 80-4.5 MCG/ACT inhaler Inhale 2 puffs into the lungs 2 (two) times daily.   clobetasol cream (TEMOVATE) 0.05 % Apply 1 Application topically 2 (two) times daily.   Evolocumab (REPATHA) 140 MG/ML SOSY INJECT 140 MG UNDER THE SKIN EVERY 14 DAYS   furosemide (LASIX) 20 MG tablet Take 1 tablet (20 mg) by mouth once daily as needed for weight gain of 3 lbs or more overnight (Patient not taking: Reported on 08/23/2023)   linaclotide (LINZESS) 290 MCG CAPS capsule Take 290 mcg by mouth daily before breakfast.   metoprolol succinate (TOPROL-XL) 100 MG 24 hr tablet TAKE 1 TABLET BY MOUTH DAILY   Omega-3 Fatty Acids (FISH OIL) 1000 MG CAPS Take 1,000 mg by mouth.   ondansetron (ZOFRAN) 4 MG tablet Take 1 tablet (4 mg total) by mouth 2 (two) times daily as needed for nausea or vomiting.   pregabalin (LYRICA) 50 MG capsule Take by mouth. Take 1 capsule by mouth in the morning, 1 capsule at afternoon, 2 capsules at bedtime.   RABEprazole (ACIPHEX) 20 MG tablet Take 20 mg by mouth 2 (two) times daily.   traMADol (ULTRAM) 50 MG tablet Take 1 tablet by mouth 2 (two) times daily as needed.   venlafaxine XR (EFFEXOR-XR) 150 MG 24 hr capsule TAKE 1 CAPSULE BY MOUTH DAILY  WITH BREAKFAST   [DISCONTINUED] potassium chloride (KLOR-CON) 10 MEQ tablet Take 1 tablet (10 meq) by mouth once daily as needed only when taking lasix (furosemide) (Patient not taking: Reported on 08/23/2023)   [DISCONTINUED] sucralfate (CARAFATE) 1 g tablet Take 1 g by  mouth daily as needed (GI sympotms).  (Patient not taking: Reported on 08/23/2023)   No facility-administered encounter medications on file as of 10/11/2023.     Lab Results  Component Value Date   WBC 6.4 10/11/2023   HGB 12.5 10/11/2023   HCT 38.6 10/11/2023   PLT 439.0 (H) 10/11/2023   GLUCOSE 97 10/11/2023   CHOL 166 10/11/2023   TRIG 127.0 10/11/2023   HDL 57.60 10/11/2023   LDLDIRECT 172.8 09/17/2013   LDLCALC 83 10/11/2023   ALT 9 10/11/2023   AST 14 10/11/2023   NA 141 10/11/2023   K 4.5 10/11/2023   CL 107 10/11/2023   CREATININE 1.09 10/11/2023   BUN 12 10/11/2023   CO2 26 10/11/2023   TSH 2.05 06/03/2023   INR 1.0 06/11/2019   HGBA1C 6.0 10/11/2023   MICROALBUR 1.1 04/28/2015    MM 3D DIAGNOSTIC MAMMOGRAM BILATERAL BREAST Result Date: 06/28/2023 CLINICAL DATA:  Patient status post 3 site stereo biopsy left breast with 1 of the sites demonstrating focal ALH at  the ribbon marker. Additional probably benign calcifications in the right breast. EXAM: DIGITAL DIAGNOSTIC BILATERAL MAMMOGRAM WITH TOMOSYNTHESIS AND CAD TECHNIQUE: Bilateral digital diagnostic mammography and breast tomosynthesis was performed. The images were evaluated with computer-aided detection. COMPARISON:  Previous exam(s). ACR Breast Density Category c: The breasts are heterogeneously dense, which may obscure small masses. FINDINGS: Stable probably benign right breast calcifications. Stable biopsy site within the left breast. Stable additional punctate calcifications within the left breast on magnification views. No new or suspicious findings within either breast. IMPRESSION: Stable biopsy site within the left breast. Stable probably benign bilateral breast calcifications. RECOMMENDATION: Bilateral diagnostic mammography with magnification views in 6 months to reassess the probably benign bilateral breast calcifications as well as the ribbon marking clip biopsy site which demonstrated ALH. I have discussed the  findings and recommendations with the patient. If applicable, a reminder letter will be sent to the patient regarding the next appointment. BI-RADS CATEGORY  3: Probably benign. Electronically Signed   By: Annia Belt M.D.   On: 06/28/2023 11:53       Assessment & Plan:  Aortic atherosclerosis (HCC) Assessment & Plan: Continue repatha.    Hypercholesteremia Assessment & Plan: Continue repatha. Low cholesterol diet and exercise. Follow lipid panel.   Orders: -     CBC with Differential/Platelet -     Basic metabolic panel -     Hepatic function panel -     Lipid panel  Hyperglycemia -     Hemoglobin A1c  Atypical hyperplasia of breast Assessment & Plan: Had f/u with Dr Peggye Form. - f/u atypical lobular hyperplasia - both breasts. Last evlauated 07/05/23. Recommended f/u mammo in 6 months and to continue f/u with oncology. On anastrozole. Continue f/u with oncology.    CHF NYHA class III (symptoms with mildly strenuous activities), acute on chronic, diastolic (HCC) Assessment & Plan: Repeat echo 11/07/21 showed LVEF 65-70%, no WMA, normal RV function. Had f/u with Dr Kirke Corin 12/28/22 - paroxysmal SVT - continue toprol 100mg  q day.  Stable. F/u chronic diastolic heart failure.  Per report, not taking lasix. Breathing stable. No evidence of volume overload on exam.    Diastolic heart failure, unspecified HF chronicity (HCC) Assessment & Plan: History of diastolic dysfunction.  EF 60-65%.  Continue metoprolol. Breathing stable. No evidence of volume overload on exam.    Essential (hemorrhagic) thrombocythemia (HCC) Assessment & Plan: Has been worked up by hematology.  Follow cbc.    Essential hypertension Assessment & Plan: Continue toprol. Follow pressures.  No changes today.    Gastroesophageal reflux disease without esophagitis Assessment & Plan: Continues on aciphex. If takes medication and watches what eats - symptoms controlled.    Midline low back pain without  sciatica, unspecified chronicity Assessment & Plan: Seen physiatry for her neck and back pain.  Last evaluated on 2424-recommended to continue Tylenol, exercises, Lyrica.  Was referred to PT.  Recommended follow-up with Dr. Myer Haff.  Saw Dr Myer Haff - last 08/23/23 - f/u peripheral neuropathy with neuropathic pain - moderate stenosis L4-5. He discussed treatment options - spinal cord stimulator vs L4-5 decompression.    PSVT (paroxysmal supraventricular tachycardia) (HCC) Assessment & Plan: Has a history of PSVT.  Has seen Dr. Bascom Levels.  Continue metoprolol.  Denies increased heart rate or palpitations.  No changes in medications today.   Statin myopathy Assessment & Plan: Previous joint aching.  Unable to take statin medication.  On Repatha.  Tolerating.   Stress Assessment & Plan: Increase stress with her husband's  medical issues.  Discussed.  Continues Effexor.  Will notify me if feels needs further intervention.      Dale Almena, MD

## 2023-10-12 ENCOUNTER — Encounter: Payer: Self-pay | Admitting: Internal Medicine

## 2023-10-14 ENCOUNTER — Encounter: Payer: Self-pay | Admitting: Internal Medicine

## 2023-10-16 ENCOUNTER — Encounter: Payer: Self-pay | Admitting: Internal Medicine

## 2023-10-16 NOTE — Assessment & Plan Note (Signed)
 Had f/u with Dr Peggye Form. - f/u atypical lobular hyperplasia - both breasts. Last evlauated 07/05/23. Recommended f/u mammo in 6 months and to continue f/u with oncology. On anastrozole. Continue f/u with oncology.

## 2023-10-16 NOTE — Assessment & Plan Note (Signed)
 Previous joint aching.  Unable to take statin medication.  On Repatha.  Tolerating.

## 2023-10-16 NOTE — Assessment & Plan Note (Signed)
Has been worked up by hematology.  Follow cbc.  

## 2023-10-16 NOTE — Assessment & Plan Note (Signed)
 Has a history of PSVT.  Has seen Dr. Bascom Levels.  Continue metoprolol.  Denies increased heart rate or palpitations.  No changes in medications today.

## 2023-10-16 NOTE — Assessment & Plan Note (Signed)
 History of diastolic dysfunction.  EF 60-65%.  Continue metoprolol. Breathing stable. No evidence of volume overload on exam.

## 2023-10-16 NOTE — Assessment & Plan Note (Signed)
 Increase stress with her husband's medical issues.  Discussed.  Continues Effexor.  Will notify me if feels needs further intervention.

## 2023-10-16 NOTE — Assessment & Plan Note (Signed)
 Continues on aciphex. If takes medication and watches what eats - symptoms controlled.

## 2023-10-16 NOTE — Assessment & Plan Note (Signed)
Continue repatha.

## 2023-10-16 NOTE — Assessment & Plan Note (Signed)
 Continue toprol. Follow pressures.  No changes today.

## 2023-10-16 NOTE — Assessment & Plan Note (Signed)
 Seen physiatry for her neck and back pain.  Last evaluated on 2424-recommended to continue Tylenol, exercises, Lyrica.  Was referred to PT.  Recommended follow-up with Dr. Myer Haff.  Saw Dr Myer Haff - last 08/23/23 - f/u peripheral neuropathy with neuropathic pain - moderate stenosis L4-5. He discussed treatment options - spinal cord stimulator vs L4-5 decompression.

## 2023-10-16 NOTE — Assessment & Plan Note (Signed)
 Repeat echo 11/07/21 showed LVEF 65-70%, no WMA, normal RV function. Had f/u with Dr Kirke Corin 12/28/22 - paroxysmal SVT - continue toprol 100mg  q day.  Stable. F/u chronic diastolic heart failure.  Per report, not taking lasix. Breathing stable. No evidence of volume overload on exam.

## 2023-10-16 NOTE — Assessment & Plan Note (Signed)
Continue repatha.  Low cholesterol diet and exercise.  Follow lipid panel.  

## 2023-10-27 ENCOUNTER — Ambulatory Visit
Admission: RE | Admit: 2023-10-27 | Discharge: 2023-10-27 | Disposition: A | Discharge status: Home / Self Care | Source: Ambulatory Visit | Attending: Oncology | Admitting: Oncology

## 2023-10-27 DIAGNOSIS — Z79899 Other long term (current) drug therapy: Secondary | ICD-10-CM | POA: Diagnosis not present

## 2023-10-27 DIAGNOSIS — Z780 Asymptomatic menopausal state: Secondary | ICD-10-CM | POA: Diagnosis not present

## 2023-10-27 DIAGNOSIS — N6099 Unspecified benign mammary dysplasia of unspecified breast: Secondary | ICD-10-CM | POA: Insufficient documentation

## 2023-10-27 DIAGNOSIS — M810 Age-related osteoporosis without current pathological fracture: Secondary | ICD-10-CM | POA: Diagnosis not present

## 2023-11-01 ENCOUNTER — Inpatient Hospital Stay: Payer: Medicare Other | Attending: Oncology | Admitting: Oncology

## 2023-11-01 ENCOUNTER — Encounter: Payer: Self-pay | Admitting: Oncology

## 2023-11-01 VITALS — BP 135/61 | HR 79 | Temp 98.9°F | Resp 18 | Ht 65.0 in | Wt 233.0 lb

## 2023-11-01 DIAGNOSIS — M150 Primary generalized (osteo)arthritis: Secondary | ICD-10-CM

## 2023-11-01 DIAGNOSIS — Z833 Family history of diabetes mellitus: Secondary | ICD-10-CM | POA: Diagnosis not present

## 2023-11-01 DIAGNOSIS — Z818 Family history of other mental and behavioral disorders: Secondary | ICD-10-CM | POA: Diagnosis not present

## 2023-11-01 DIAGNOSIS — Z885 Allergy status to narcotic agent status: Secondary | ICD-10-CM | POA: Insufficient documentation

## 2023-11-01 DIAGNOSIS — M791 Myalgia, unspecified site: Secondary | ICD-10-CM | POA: Diagnosis not present

## 2023-11-01 DIAGNOSIS — Z79899 Other long term (current) drug therapy: Secondary | ICD-10-CM | POA: Diagnosis not present

## 2023-11-01 DIAGNOSIS — R531 Weakness: Secondary | ICD-10-CM | POA: Diagnosis not present

## 2023-11-01 DIAGNOSIS — I5032 Chronic diastolic (congestive) heart failure: Secondary | ICD-10-CM | POA: Diagnosis not present

## 2023-11-01 DIAGNOSIS — M549 Dorsalgia, unspecified: Secondary | ICD-10-CM | POA: Insufficient documentation

## 2023-11-01 DIAGNOSIS — Z8349 Family history of other endocrine, nutritional and metabolic diseases: Secondary | ICD-10-CM | POA: Diagnosis not present

## 2023-11-01 DIAGNOSIS — I110 Hypertensive heart disease with heart failure: Secondary | ICD-10-CM | POA: Diagnosis not present

## 2023-11-01 DIAGNOSIS — Z79811 Long term (current) use of aromatase inhibitors: Secondary | ICD-10-CM | POA: Insufficient documentation

## 2023-11-01 DIAGNOSIS — Z881 Allergy status to other antibiotic agents status: Secondary | ICD-10-CM | POA: Insufficient documentation

## 2023-11-01 DIAGNOSIS — R52 Pain, unspecified: Secondary | ICD-10-CM | POA: Diagnosis not present

## 2023-11-01 DIAGNOSIS — N6099 Unspecified benign mammary dysplasia of unspecified breast: Secondary | ICD-10-CM

## 2023-11-01 DIAGNOSIS — Z90710 Acquired absence of both cervix and uterus: Secondary | ICD-10-CM | POA: Diagnosis not present

## 2023-11-01 DIAGNOSIS — Z87891 Personal history of nicotine dependence: Secondary | ICD-10-CM | POA: Insufficient documentation

## 2023-11-01 DIAGNOSIS — Z8249 Family history of ischemic heart disease and other diseases of the circulatory system: Secondary | ICD-10-CM | POA: Diagnosis not present

## 2023-11-01 DIAGNOSIS — Z8673 Personal history of transient ischemic attack (TIA), and cerebral infarction without residual deficits: Secondary | ICD-10-CM | POA: Insufficient documentation

## 2023-11-01 DIAGNOSIS — Z9049 Acquired absence of other specified parts of digestive tract: Secondary | ICD-10-CM | POA: Insufficient documentation

## 2023-11-01 DIAGNOSIS — Z803 Family history of malignant neoplasm of breast: Secondary | ICD-10-CM | POA: Insufficient documentation

## 2023-11-01 DIAGNOSIS — R5383 Other fatigue: Secondary | ICD-10-CM | POA: Diagnosis not present

## 2023-11-01 DIAGNOSIS — M810 Age-related osteoporosis without current pathological fracture: Secondary | ICD-10-CM | POA: Diagnosis not present

## 2023-11-01 DIAGNOSIS — Z880 Allergy status to penicillin: Secondary | ICD-10-CM | POA: Insufficient documentation

## 2023-11-01 MED ORDER — ALENDRONATE SODIUM 70 MG PO TABS: 70.0000 mg | ORAL_TABLET | ORAL | 2 refills | Status: DC

## 2023-11-01 NOTE — Progress Notes (Signed)
 [facility]  [phone] [phone]) [phone]  ID: Nicole Sims OB: [birth year]  MR#: [id number]  VHQ#:[id number]  Patient Care Team: Dale [place], MD as PCP - General (Internal Medicine) Iran Ouch, MD as PCP - Cardiology (Cardiology)  CHIEF COMPLAINT: Atypical hyperplasia  INTERVAL HISTORY: Patient returns to clinic today for routine evaluation.  She has multiple medical complaints that all appear chronic in nature.  She is tolerating anastrozole without significant side effects.  She has no neurologic complaints.  She denies any recent fevers or illnesses.  She has a good appetite.  She has no chest pain, shortness of breath, cough, or hemoptysis.  She denies any nausea, vomiting, constipation, or diarrhea.  She has no urinary complaints.  Patient offers no further specific complaints today.  REVIEW OF SYSTEMS:   Review of Systems  Constitutional:  Positive for malaise/fatigue. Negative for fever and weight loss.  Respiratory: Negative.  Negative for hemoptysis and shortness of breath.   Cardiovascular: Negative.  Negative for chest pain and leg swelling.  Gastrointestinal: Negative.  Negative for abdominal pain.  Genitourinary: Negative.  Negative for dysuria.  Musculoskeletal:  Positive for back pain and myalgias.  Skin: Negative.  Negative for rash.  Neurological:  Positive for weakness. Negative for dizziness, focal weakness and headaches.  Psychiatric/Behavioral: Negative.  The patient is not nervous/anxious.     As per HPI. Otherwise, a complete review of systems is negative.  PAST MEDICAL HISTORY: Past Medical History:  Diagnosis Date   Anemia    Anginal pain (HCC)    Anxiety    Asthma    Cataract cortical, senile    CHF (congestive heart failure) (HCC)    Chronic headaches    Diastolic heart failure (HCC)    Diverticulosis    Environmental allergies    GERD (gastroesophageal reflux disease)    Heart murmur    Hyperglycemia     Hyperlipidemia    Hypertension    Leukocytosis    Obesity    Osteoarthritis    Palpitations    Restless leg syndrome    Sleep apnea    Stroke (HCC)    Thrombocytosis    Urinary incontinence    mixed   Venous insufficiency    Vitamin D deficiency    Wears dentures    partial upper    PAST SURGICAL HISTORY: Past Surgical History:  Procedure Laterality Date   BLADDER SURGERY     x2   [facility]   BREAST BIOPSY Left 06/24/2022   Stereo Bx, X-clip, neg   BREAST BIOPSY Left 06/24/2022   Stereo Bx, ribbon clip, atypical lobular hyperplasis   BREAST BIOPSY  06/24/2022   Stereo Bx, Coil Clip, neg   BREAST BIOPSY Left 06/24/2022   MM LT BREAST BX W LOC DEV 1ST LESION IMAGE BX SPEC STEREO GUIDE 06/24/2022 [facility]   BREAST BIOPSY Left 06/24/2022   MM LT BREAST BX W LOC DEV EA AD LESION IMG BX SPEC STEREO GUIDE 06/24/2022 [facility]   BREAST BIOPSY Left 06/24/2022   MM LT BREAST BX W LOC DEV EA AD LESION IMG BX SPEC STEREO GUIDE 06/24/2022 [facility]   BREAST CYST ASPIRATION Bilateral 2005   approximate year   CARDIAC CATHETERIZATION     [name]   CATARACT EXTRACTION W/PHACO Right 04/24/2020   Procedure: CATARACT EXTRACTION PHACO AND INTRAOCULAR LENS PLACEMENT (IOC) RIGHT;  Surgeon: Galen Manila, MD;  Location: [facility];  Service: Ophthalmology;  Laterality: Right;  6.75 0:37.3  CATARACT EXTRACTION W/PHACO Left 01/27/2021   Procedure: CATARACT EXTRACTION PHACO AND INTRAOCULAR LENS PLACEMENT (IOC) LEFT;  Surgeon: Galen Manila, MD;  Location: [facility];  Service: Ophthalmology;  Laterality: Left;  6.75 00:43.9   CERVICAL CONE BIOPSY     CIS   CHOLECYSTECTOMY     COLONOSCOPY WITH PROPOFOL N/A 07/19/2016   Procedure: COLONOSCOPY WITH PROPOFOL;  Surgeon: Scot Jun, MD;  Location: [facility] ENDOSCOPY;  Service: Endoscopy;  Laterality: N/A;   COLONOSCOPY WITH PROPOFOL N/A 10/20/2018   Procedure: COLONOSCOPY WITH  PROPOFOL;  Surgeon: Christena Deem, MD;  Location: [facility] ENDOSCOPY;  Service: Endoscopy;  Laterality: N/A;   COLONOSCOPY WITH PROPOFOL N/A 01/11/2023   Procedure: COLONOSCOPY WITH PROPOFOL;  Surgeon: Regis Bill, MD;  Location: [facility] ENDOSCOPY;  Service: Endoscopy;  Laterality: N/A;   ESOPHAGOGASTRODUODENOSCOPY N/A 05/12/2021   Procedure: ESOPHAGOGASTRODUODENOSCOPY (EGD);  Surgeon: Regis Bill, MD;  Location: [facility] ENDOSCOPY;  Service: Endoscopy;  Laterality: N/A;   ESOPHAGOGASTRODUODENOSCOPY (EGD) WITH PROPOFOL N/A 07/19/2016   Procedure: ESOPHAGOGASTRODUODENOSCOPY (EGD) WITH PROPOFOL;  Surgeon: Scot Jun, MD;  Location: [facility] ENDOSCOPY;  Service: Endoscopy;  Laterality: N/A;   FLEXIBLE SIGMOIDOSCOPY     KNEE ARTHROSCOPY  08/13/2008   knee replacement and revision     left   RECTOCELE REPAIR     RIGHT/LEFT HEART CATH AND CORONARY ANGIOGRAPHY Bilateral 04/10/2018   Procedure: RIGHT/LEFT HEART CATH AND CORONARY ANGIOGRAPHY;  Surgeon: Iran Ouch, MD;  Location: [facility];  Service: Cardiovascular;  Laterality: Bilateral;   ROTATOR CUFF REPAIR     bilateral   SHOULDER SURGERY  11/17/2005   TONSILLECTOMY  1962   VAGINAL HYSTERECTOMY  1974   abnormal pap and carcinoma in situ    FAMILY HISTORY: Family History  Problem Relation Age of Onset   Diabetes Mellitus II Father    Thyroid disease Father    Alzheimer's disease Father    Breast cancer Maternal Aunt    Hypertension Mother    Heart Problems Brother    Leukemia Maternal Aunt    Alzheimer's disease Paternal Aunt    Alzheimer's disease Paternal Uncle    Alzheimer's disease Paternal Uncle    Alzheimer's disease Paternal Uncle    Alzheimer's disease Paternal Aunt    Colon cancer Neg Hx    Kidney cancer Neg Hx    Bladder Cancer Neg Hx     ADVANCED DIRECTIVES (Y/N):  N  HEALTH MAINTENANCE: Social History   Tobacco Use   Smoking status: Former    Current packs/day: 0.00    Average  packs/day: 1 pack/day for 15.0 years (15.0 ttl pk-yrs)    Types: Cigarettes    Start date: 08/16/1968    Quit date: 08/17/1983    Years since quitting: 40.2   Smokeless tobacco: Never  Vaping Use   Vaping status: Never Used  Substance Use Topics   Alcohol use: Yes    Alcohol/week: 0.0 standard drinks of alcohol    Comment: rarely   Drug use: No     Colonoscopy:  PAP:  Bone density:  Lipid panel:  Allergies  Allergen Reactions   Levaquin [Levofloxacin]    Augmentin [Amoxicillin-Pot Clavulanate] Other (See Comments)    Questionable itching   Bactrim [Sulfamethoxazole-Trimethoprim] Rash   Doxycycline Itching   Hydrocodone-Acetaminophen Other (See Comments)    GI distress   Pseudoephedrine Rash    Itching of the scalp   Shellfish Allergy Nausea And Vomiting    Nausea and vomiting  Current Outpatient Medications  Medication Sig Dispense Refill   acetaminophen (TYLENOL) 500 MG tablet Take 500 mg by mouth every 6 (six) hours as needed.     anastrozole (ARIMIDEX) 1 MG tablet Take 1 tablet (1 mg total) by mouth daily. 90 tablet 3   aspirin 325 MG tablet Take 325 mg by mouth daily.     budesonide-formoterol (SYMBICORT) 80-4.5 MCG/ACT inhaler Inhale 2 puffs into the lungs 2 (two) times daily. 1 each 3   clobetasol cream (TEMOVATE) 0.05 % Apply 1 Application topically 2 (two) times daily. 30 g 0   Evolocumab (REPATHA) 140 MG/ML SOSY INJECT 140 MG UNDER THE SKIN EVERY 14 DAYS 6 mL 3   linaclotide (LINZESS) 290 MCG CAPS capsule Take 290 mcg by mouth daily before breakfast.     metoprolol succinate (TOPROL-XL) 100 MG 24 hr tablet TAKE 1 TABLET BY MOUTH DAILY 90 tablet 3   Omega-3 Fatty Acids (FISH OIL) 1000 MG CAPS Take 1,000 mg by mouth.     ondansetron (ZOFRAN) 4 MG tablet Take 1 tablet (4 mg total) by mouth 2 (two) times daily as needed for nausea or vomiting. 15 tablet 0   pregabalin (LYRICA) 50 MG capsule Take by mouth. Take 1 capsule by mouth in the morning, 1 capsule at  afternoon, 2 capsules at bedtime.     RABEprazole (ACIPHEX) 20 MG tablet Take 20 mg by mouth 2 (two) times daily.     venlafaxine XR (EFFEXOR-XR) 150 MG 24 hr capsule TAKE 1 CAPSULE BY MOUTH DAILY  WITH BREAKFAST 90 capsule 3   albuterol (VENTOLIN HFA) 108 (90 Base) MCG/ACT inhaler Inhale 2 puffs into the lungs every 4 (four) hours as needed for wheezing or shortness of breath. (Patient not taking: Reported on 11/01/2023) 1 each 0   furosemide (LASIX) 20 MG tablet Take 1 tablet (20 mg) by mouth once daily as needed for weight gain of 3 lbs or more overnight (Patient not taking: Reported on 11/01/2023)     traMADol (ULTRAM) 50 MG tablet Take 1 tablet by mouth 2 (two) times daily as needed. (Patient not taking: Reported on 11/01/2023)     No current facility-administered medications for this visit.    OBJECTIVE: Vitals:   11/01/23 1426  BP: 135/61  Pulse: 79  Resp: 18  Temp: 98.9 F (37.2 C)  SpO2: 98%     Body mass index is 38.77 kg/m.    ECOG FS:0 - Asymptomatic  General: Well-developed, well-nourished, no acute distress. Eyes: Pink conjunctiva, anicteric sclera. HEENT: Normocephalic, moist mucous membranes. Lungs: No audible wheezing or coughing. Heart: Regular rate and rhythm. Abdomen: Soft, nontender, no obvious distention. Musculoskeletal: No edema, cyanosis, or clubbing. Neuro: Alert, answering all questions appropriately. Cranial nerves grossly intact. Skin: No rashes or petechiae noted. Psych: Normal affect.  LAB RESULTS:  Lab Results  Component Value Date   NA 141 10/11/2023   K 4.5 10/11/2023   CL 107 10/11/2023   CO2 26 10/11/2023   GLUCOSE 97 10/11/2023   BUN 12 10/11/2023   CREATININE 1.09 10/11/2023   CALCIUM 9.3 10/11/2023   PROT 7.8 10/11/2023   ALBUMIN 4.0 10/11/2023   AST 14 10/11/2023   ALT 9 10/11/2023   ALKPHOS 95 10/11/2023   BILITOT 0.4 10/11/2023   GFRNONAA >60 09/28/2021   GFRAA >60 10/24/2019    Lab Results  Component Value Date   WBC 6.4  10/11/2023   NEUTROABS 3.0 10/11/2023   HGB 12.5 10/11/2023   HCT 38.6 10/11/2023  MCV 87.5 10/11/2023   PLT 439.0 (H) 10/11/2023     STUDIES: DG Bone Density Result Date: 10/27/2023 EXAM: DUAL X-RAY ABSORPTIOMETRY (DXA) FOR BONE MINERAL DENSITY IMPRESSION: Your patient Nicole Sims completed a BMD test on 10/27/2023 using the [employer] DXA System (software version: 14.10) manufactured by [employer]. The following summarizes the results of our evaluation. Technologist: PATIENT BIOGRAPHICAL: Name: Nicole Sims, Nicole Sims Patient ID: [id number] Birth Date: 07-15-1951 Height: 65.0 in. Gender: Female Exam Date: 10/27/2023 Weight: 230.4 lbs. Indications: Hysterectomy, Postmenopausal, Caucasian Fractures: ankle Treatments: Vitamin D, Anastrozole DENSITOMETRY RESULTS: Site         Region     Measured Date Measured Age WHO Classification Young Adult T-score BMD         %Change vs. Previous Significant Change (*) Left Forearm Radius 33% 10/27/2023 73.5 Osteoporosis -2.5 0.661 g/cm2 - - DualFemur Neck Left 10/27/2023 73.5 Osteopenia -1.8 0.792 g/cm2 -1.5% - DualFemur Neck Left 09/28/2022 72.4 Osteopenia -1.7 0.804 g/cm2 -4.1% - DualFemur Neck Left 07/22/2016 66.2 Osteopenia -1.4 0.838 g/cm2 - - DualFemur Total Mean 10/27/2023 73.5 Osteopenia -1.2 0.854 g/cm2 -3.9% Yes DualFemur Total Mean 09/28/2022 72.4 Normal -0.9 0.889 g/cm2 -1.3% - DualFemur Total Mean 07/22/2016 66.2 Normal -0.9 0.901 g/cm2 - - ASSESSMENT: The BMD measured at Forearm Radius 33% is 0.661 g/cm2 with a T-score of -2.5. This patient's diagnostic category is OSTEOPOROSIS according to World Health Organization [facility]) criteria. Lumbar spine was not utilized due to advanced degenerative changes. Compared with prior study, there has been a significant decrease in the total hip. The scan quality is good. World Science writer [facility]) criteria for post-menopausal, Caucasian Women: Normal:                   T-score at or above -1 SD Osteopenia/low  bone mass: T-score between -1 and -2.5 SD Osteoporosis:             T-score at or below -2.5 SD RECOMMENDATIONS: 1. All patients should optimize calcium and vitamin D intake. 2. Consider FDA-approved medical therapies in postmenopausal women and men aged 57 years and older, based on the following: a. A hip or vertebral(clinical or morphometric) fracture b. T-score < -2.5 at the femoral neck or spine after appropriate evaluation to exclude secondary causes c. Low bone mass (T-score between -1.0 and -2.5 at the femoral neck or spine) and a 10-year probability of a hip fracture > 3% or a 10-year probability of a major osteoporosis-related fracture > 20% based on the [place]-adapted WHO algorithm 3. Clinician judgment and/or patient preferences may indicate treatment for people with 10-year fracture probabilities above or below these levels FOLLOW-UP: People with diagnosed cases of osteoporosis or at high risk for fracture should have regular bone mineral density tests. For patients eligible for Medicare, routine testing is allowed once every 2 years. The testing frequency can be increased to one year for patients who have rapidly progressing disease, those who are receiving or discontinuing medical therapy to restore bone mass, or have additional risk factors. I have reviewed this report, and agree with the above findings. [facility] Electronically Signed   By: Harmon Pier M.D.   On: 10/27/2023 13:03    ASSESSMENT: Atypical hyperplasia  PLAN:    Atypical hyperplasia: Pathology results with no malignancy identified.  Atypical hyperplasia increases breast cancer risk 3-5 times over the general population.  Previously, we discussed at length that anastrozole can lead to an approximate 50% risk reduction of developing breast cancer lifetime.  Risks and benefits were previously discussed including possible side effects.  Continue anastrozole for a total of 5 years completing treatment in December 2028.  No  further invention is needed.  Patient reports her mammogram ordered by her primary surgeon which is due in approximately June 2025.  Return to clinic in 1 year for routine evaluation.  Osteoporosis: Patient's most recent bone mineral density on October 27, 2023 revealed a T-score of -2.5 which is slightly worse than 1 year prior where her T-score was reported -1.8.  Patient was given a prescription for Fosamax today.  She was also instructed to take calcium and vitamin D supplementation on a regular basis.  Will get repeat bone mineral density in 1 year prior to next clinic visit.   Pain: Continue tramadol as per pain clinic.    I spent a total of 30 minutes reviewing chart data, face-to-face evaluation with the patient, counseling and coordination of care as detailed above.   Patient expressed understanding and was in agreement with this plan. She also understands that She can call clinic at any time with any questions, concerns, or complaints.    Jeralyn Ruths, MD   11/01/2023 2:43 PM

## 2023-11-02 ENCOUNTER — Encounter: Payer: Self-pay | Admitting: Internal Medicine

## 2023-11-02 DIAGNOSIS — M810 Age-related osteoporosis without current pathological fracture: Secondary | ICD-10-CM | POA: Insufficient documentation

## 2023-11-04 DIAGNOSIS — H26493 Other secondary cataract, bilateral: Secondary | ICD-10-CM | POA: Diagnosis not present

## 2023-11-04 DIAGNOSIS — H43813 Vitreous degeneration, bilateral: Secondary | ICD-10-CM | POA: Diagnosis not present

## 2023-11-04 DIAGNOSIS — M3501 Sicca syndrome with keratoconjunctivitis: Secondary | ICD-10-CM | POA: Diagnosis not present

## 2023-11-08 ENCOUNTER — Other Ambulatory Visit: Payer: Self-pay | Admitting: General Surgery

## 2023-11-08 DIAGNOSIS — Z853 Personal history of malignant neoplasm of breast: Secondary | ICD-10-CM

## 2023-11-16 ENCOUNTER — Other Ambulatory Visit: Payer: Self-pay | Admitting: Cardiovascular Disease

## 2023-11-16 DIAGNOSIS — E782 Mixed hyperlipidemia: Secondary | ICD-10-CM

## 2023-11-16 DIAGNOSIS — I700 Atherosclerosis of aorta: Secondary | ICD-10-CM

## 2023-12-07 DIAGNOSIS — M65332 Trigger finger, left middle finger: Secondary | ICD-10-CM | POA: Diagnosis not present

## 2023-12-07 DIAGNOSIS — M654 Radial styloid tenosynovitis [de Quervain]: Secondary | ICD-10-CM | POA: Diagnosis not present

## 2023-12-07 DIAGNOSIS — M720 Palmar fascial fibromatosis [Dupuytren]: Secondary | ICD-10-CM | POA: Diagnosis not present

## 2023-12-09 ENCOUNTER — Other Ambulatory Visit: Payer: Medicare Other

## 2023-12-13 ENCOUNTER — Ambulatory Visit: Payer: Medicare Other | Admitting: Internal Medicine

## 2023-12-13 ENCOUNTER — Other Ambulatory Visit: Payer: Self-pay | Admitting: Cardiovascular Disease

## 2023-12-13 DIAGNOSIS — E782 Mixed hyperlipidemia: Secondary | ICD-10-CM

## 2023-12-13 DIAGNOSIS — I700 Atherosclerosis of aorta: Secondary | ICD-10-CM

## 2023-12-15 HISTORY — PX: TRIGGER FINGER RELEASE: SHX641

## 2023-12-17 ENCOUNTER — Other Ambulatory Visit: Payer: Self-pay | Admitting: Medical

## 2023-12-21 ENCOUNTER — Encounter (HOSPITAL_COMMUNITY): Payer: Self-pay

## 2023-12-21 DIAGNOSIS — M654 Radial styloid tenosynovitis [de Quervain]: Secondary | ICD-10-CM | POA: Diagnosis not present

## 2023-12-28 ENCOUNTER — Ambulatory Visit
Admission: RE | Admit: 2023-12-28 | Discharge: 2023-12-28 | Disposition: A | Discharge status: Home / Self Care | Source: Ambulatory Visit | Attending: General Surgery | Admitting: General Surgery

## 2023-12-28 ENCOUNTER — Other Ambulatory Visit: Payer: Self-pay | Admitting: General Surgery

## 2023-12-28 DIAGNOSIS — N63 Unspecified lump in unspecified breast: Secondary | ICD-10-CM | POA: Insufficient documentation

## 2023-12-28 DIAGNOSIS — Z853 Personal history of malignant neoplasm of breast: Secondary | ICD-10-CM | POA: Insufficient documentation

## 2023-12-28 DIAGNOSIS — N644 Mastodynia: Secondary | ICD-10-CM | POA: Diagnosis not present

## 2023-12-28 DIAGNOSIS — R92333 Mammographic heterogeneous density, bilateral breasts: Secondary | ICD-10-CM | POA: Diagnosis not present

## 2024-01-05 DIAGNOSIS — N6091 Unspecified benign mammary dysplasia of right breast: Secondary | ICD-10-CM | POA: Diagnosis not present

## 2024-01-05 DIAGNOSIS — N6092 Unspecified benign mammary dysplasia of left breast: Secondary | ICD-10-CM | POA: Diagnosis not present

## 2024-01-25 DIAGNOSIS — M720 Palmar fascial fibromatosis [Dupuytren]: Secondary | ICD-10-CM | POA: Diagnosis not present

## 2024-01-25 DIAGNOSIS — M65331 Trigger finger, right middle finger: Secondary | ICD-10-CM | POA: Diagnosis not present

## 2024-01-30 ENCOUNTER — Ambulatory Visit (INDEPENDENT_AMBULATORY_CARE_PROVIDER_SITE_OTHER): Payer: Medicare Other | Admitting: *Deleted

## 2024-01-30 VITALS — Ht 65.0 in | Wt 230.0 lb

## 2024-01-30 DIAGNOSIS — Z0000 Encounter for general adult medical examination without abnormal findings: Secondary | ICD-10-CM | POA: Diagnosis not present

## 2024-01-30 NOTE — Patient Instructions (Signed)
 Nicole Sims , Thank you for taking time out of your busy schedule to complete your Annual Wellness Visit with me. I enjoyed our conversation and look forward to speaking with you again next year. I, as well as your care team,  appreciate your ongoing commitment to your health goals. Please review the following plan we discussed and let me know if I can assist you in the future. Your Game plan/ To Do List    Referrals: If you haven't heard from the office you've been referred to, please reach out to them at the phone provided.  Remember to get your second shingles vaccine and update your tetanus (Tdap) vaccine.  Follow up Visits: Next Medicare AWV with our clinical staff: 01/30/25 @ 10:50   Have you seen your provider in the last 6 months (3 months if uncontrolled diabetes)? Yes Next Office Visit with your provider: 02/09/24  Clinician Recommendations:  Aim for 30 minutes of exercise or brisk walking, 6-8 glasses of water, and 5 servings of fruits and vegetables each day.       This is a list of the screening recommended for you and due dates:  Health Maintenance  Topic Date Due   DTaP/Tdap/Td vaccine (1 - Tdap) Never done   Pneumococcal Vaccine for age over 60 (2 of 2 - PCV) 06/25/2020   Zoster (Shingles) Vaccine (2 of 2) 02/22/2021   COVID-19 Vaccine (4 - 2024-25 season) 04/17/2023   Flu Shot  03/16/2024   Mammogram  12/27/2024   Colon Cancer Screening  01/10/2025   Medicare Annual Wellness Visit  01/29/2025   DEXA scan (bone density measurement)  Completed   Hepatitis C Screening  Completed   HPV Vaccine  Aged Out   Meningitis B Vaccine  Aged Out    Advanced directives: (ACP Link)Information on Advanced Care Planning can be found at [employer]  Print production planner Health Care Directives Advance Health Care Directives. [email or web address]  Advance Care Planning is important because it:  [x]  Makes sure you receive the medical care that is consistent with your values, goals, and  preferences  [x]  It provides guidance to your family and loved ones and reduces their decisional burden about whether or not they are making the right decisions based on your wishes.  Follow the link provided in your after visit summary or read over the paperwork we have mailed to you to help you started getting your Advance Directives in place. If you need assistance in completing these, please reach out to us  so that we can help you!

## 2024-01-30 NOTE — Progress Notes (Signed)
 Subjective:   Nicole Sims is a 73 y.o. who presents for a Medicare Wellness preventive visit.  As a reminder, Annual Wellness Visits don't include a physical exam, and some assessments may be limited, especially if this visit is performed virtually. We may recommend an in-person follow-up visit with your provider if needed.  Visit Complete: Virtual I connected with  Nicole Sims on 01/30/24 by a audio enabled telemedicine application and verified that I am speaking with the correct person using two identifiers.  Patient Location: Home  Provider Location: Home Office  I discussed the limitations of evaluation and management by telemedicine. The patient expressed understanding and agreed to proceed.  Vital Signs: Because this visit was a virtual/telehealth visit, some criteria may be missing or patient reported. Any vitals not documented were not able to be obtained and vitals that have been documented are patient reported.  VideoDeclined- This patient declined Librarian, academic. Therefore the visit was completed with audio only.  Persons Participating in Visit: Patient.  AWV Questionnaire: Yes: Patient Medicare AWV questionnaire was completed by the patient on 01/24/24 and 01/29/24; I have confirmed that all information answered by patient is correct and no changes since this date.  Cardiac Risk Factors include: dyslipidemia;hypertension;obesity (BMI >30kg/m2)     Objective:    Today's Vitals   01/30/24 0933  Weight: 230 lb (104.3 kg)  Height: 5' 5 (1.651 m)  PainSc: 3    Body mass index is 38.27 kg/m.     01/30/2024    9:55 AM 11/01/2023    2:28 PM 06/15/2023   11:28 AM 01/25/2023   10:38 AM 10/28/2022    2:51 PM 07/22/2022    2:47 PM 01/21/2022    2:50 PM  Advanced Directives  Does Patient Have a Medical Advance Directive? No Yes Yes No Yes Yes Yes  Type of Furniture conservator/restorer;Living will Healthcare Power of  [place];Living will  Healthcare Power of [place];Living will Healthcare Power of [place];Living will Healthcare Power of [place];Living will  Does patient want to make changes to medical advance directive?       No - Patient declined  Copy of Healthcare Power of Attorney in Chart?     No - copy requested  No - copy requested  Would patient like information on creating a medical advance directive? No - Patient declined   No - Patient declined       Current Medications (verified) Outpatient Encounter Medications as of 01/30/2024  Medication Sig   acetaminophen  (TYLENOL ) 500 MG tablet Take 500 mg by mouth every 6 (six) hours as needed.   albuterol  (VENTOLIN  HFA) 108 (90 Base) MCG/ACT inhaler Inhale 2 puffs into the lungs every 4 (four) hours as needed for wheezing or shortness of breath.   alendronate  (FOSAMAX ) 70 MG tablet Take 1 tablet (70 mg total) by mouth once a week. Take with a full glass of water on an empty stomach.   anastrozole  (ARIMIDEX ) 1 MG tablet Take 1 tablet (1 mg total) by mouth daily.   aspirin  325 MG tablet Take 325 mg by mouth daily.   budesonide -formoterol  (SYMBICORT ) 80-4.5 MCG/ACT inhaler Inhale 2 puffs into the lungs 2 (two) times daily.   clobetasol  cream (TEMOVATE ) 0.05 % Apply 1 Application topically 2 (two) times daily.   Evolocumab  (REPATHA ) 140 MG/ML SOSY INJECT 140 MG UNDER THE SKIN EVERY 14 DAYS   furosemide  (LASIX ) 20 MG tablet Take 1 tablet (20 mg) by mouth once  daily as needed for weight gain of 3 lbs or more overnight   linaclotide  (LINZESS ) 290 MCG CAPS capsule Take 290 mcg by mouth daily before breakfast.   metoprolol  succinate (TOPROL -XL) 100 MG 24 hr tablet TAKE 1 TABLET BY MOUTH DAILY   Omega-3 Fatty Acids (FISH OIL) 1000 MG CAPS Take 1,000 mg by mouth.   ondansetron  (ZOFRAN ) 4 MG tablet Take 1 tablet (4 mg total) by mouth 2 (two) times daily as needed for nausea or vomiting.   pregabalin (LYRICA) 50 MG capsule Take by mouth. Take 1 capsule by mouth in  the morning, 1 capsule at afternoon, 2 capsules at bedtime.   RABEprazole (ACIPHEX) 20 MG tablet Take 20 mg by mouth 2 (two) times daily.   venlafaxine  XR (EFFEXOR -XR) 150 MG 24 hr capsule TAKE 1 CAPSULE BY MOUTH DAILY  WITH BREAKFAST   traMADol (ULTRAM) 50 MG tablet Take 1 tablet by mouth 2 (two) times daily as needed. (Patient not taking: Reported on 01/30/2024)   No facility-administered encounter medications on file as of 01/30/2024.    Allergies (verified) Levaquin  [levofloxacin ], Augmentin [amoxicillin-pot clavulanate], Bactrim [sulfamethoxazole-trimethoprim ], Doxycycline , Hydrocodone-acetaminophen , Pseudoephedrine, and Shellfish allergy   History: Past Medical History:  Diagnosis Date   Anemia    Anginal pain (HCC)    Anxiety    Asthma    Cataract cortical, senile    CHF (congestive heart failure) (HCC)    Chronic headaches    Diastolic heart failure (HCC)    Diverticulosis    Environmental allergies    GERD (gastroesophageal reflux disease)    Heart murmur    Hyperglycemia    Hyperlipidemia    Hypertension    Leukocytosis    Obesity    Osteoarthritis    Palpitations    Restless leg syndrome    Sleep apnea    Stroke (HCC)    Thrombocytosis    Urinary incontinence    mixed   Venous insufficiency    Vitamin D  deficiency    Wears dentures    partial upper   Past Surgical History:  Procedure Laterality Date   BLADDER SURGERY     x2   [place]  [facility]   BREAST BIOPSY Left 06/24/2022   Stereo Bx, X-clip, neg   BREAST BIOPSY Left 06/24/2022   Stereo Bx, ribbon clip, atypical lobular hyperplasis   BREAST BIOPSY  06/24/2022   Stereo Bx, Coil Clip, neg   BREAST BIOPSY Left 06/24/2022   MM LT BREAST BX W LOC DEV 1ST LESION IMAGE BX SPEC STEREO GUIDE 06/24/2022 [facility]   BREAST BIOPSY Left 06/24/2022   MM LT BREAST BX W LOC DEV EA AD LESION IMG BX SPEC STEREO GUIDE 06/24/2022 [facility]   BREAST BIOPSY Left 06/24/2022   MM LT BREAST BX W LOC DEV  EA AD LESION IMG BX SPEC STEREO GUIDE 06/24/2022 [facility]   BREAST CYST ASPIRATION Bilateral 2005   approximate year   CARDIAC CATHETERIZATION     Bary Likes   CATARACT EXTRACTION W/PHACO Right 04/24/2020   Procedure: CATARACT EXTRACTION PHACO AND INTRAOCULAR LENS PLACEMENT (IOC) RIGHT;  Surgeon: Clair Crews, MD;  Location: [facility];  Service: Ophthalmology;  Laterality: Right;  6.75 0:37.3   CATARACT EXTRACTION W/PHACO Left 01/27/2021   Procedure: CATARACT EXTRACTION PHACO AND INTRAOCULAR LENS PLACEMENT (IOC) LEFT;  Surgeon: Clair Crews, MD;  Location: [facility];  Service: Ophthalmology;  Laterality: Left;  6.75 00:43.9   CERVICAL CONE BIOPSY     CIS   CHOLECYSTECTOMY  COLONOSCOPY WITH PROPOFOL  N/A 07/19/2016   Procedure: COLONOSCOPY WITH PROPOFOL ;  Surgeon: Cassie Click, MD;  Location: [facility];  Service: Endoscopy;  Laterality: N/A;   COLONOSCOPY WITH PROPOFOL  N/A 10/20/2018   Procedure: COLONOSCOPY WITH PROPOFOL ;  Surgeon: Deveron Fly, MD;  Location: [facility];  Service: Endoscopy;  Laterality: N/A;   COLONOSCOPY WITH PROPOFOL  N/A 01/11/2023   Procedure: COLONOSCOPY WITH PROPOFOL ;  Surgeon: Shane Darling, MD;  Location: [facility];  Service: Endoscopy;  Laterality: N/A;   ESOPHAGOGASTRODUODENOSCOPY N/A 05/12/2021   Procedure: ESOPHAGOGASTRODUODENOSCOPY (EGD);  Surgeon: Shane Darling, MD;  Location: [facility];  Service: Endoscopy;  Laterality: N/A;   ESOPHAGOGASTRODUODENOSCOPY (EGD) WITH PROPOFOL  N/A 07/19/2016   Procedure: ESOPHAGOGASTRODUODENOSCOPY (EGD) WITH PROPOFOL ;  Surgeon: Cassie Click, MD;  Location: [facility];  Service: Endoscopy;  Laterality: N/A;   FLEXIBLE SIGMOIDOSCOPY     KNEE ARTHROSCOPY  08/13/2008   knee replacement and revision     left   RECTOCELE REPAIR     RIGHT/LEFT HEART CATH AND CORONARY ANGIOGRAPHY Bilateral 04/10/2018   Procedure: RIGHT/LEFT HEART CATH AND CORONARY  ANGIOGRAPHY;  Surgeon: Wenona Hamilton, MD;  Location: [facility];  Service: Cardiovascular;  Laterality: Bilateral;   ROTATOR CUFF REPAIR     bilateral   SHOULDER SURGERY  11/17/2005   TONSILLECTOMY  1962   TRIGGER FINGER RELEASE Left 12/2023   VAGINAL HYSTERECTOMY  1974   abnormal pap and carcinoma in situ   Family History  Problem Relation Age of Onset   Diabetes Mellitus II Father    Thyroid  disease Father    Alzheimer's disease Father    Breast cancer Maternal Aunt    Hypertension Mother    Heart Problems Brother    Leukemia Maternal Aunt    Alzheimer's disease Paternal Aunt    Alzheimer's disease Paternal Uncle    Alzheimer's disease Paternal Uncle    Alzheimer's disease Paternal Uncle    Alzheimer's disease Paternal Aunt    Colon cancer Neg Hx    Kidney cancer Neg Hx    Bladder Cancer Neg Hx    Social History   Socioeconomic History   Marital status: Married    Spouse name: Not on file   Number of children: Not on file   Years of education: Not on file   Highest education level: 11th grade  Occupational History   Occupation: retired  Tobacco Use   Smoking status: Former    Current packs/day: 0.00    Average packs/day: 1 pack/day for 15.0 years (15.0 ttl pk-yrs)    Types: Cigarettes    Start date: 08/16/1968    Quit date: 08/17/1983    Years since quitting: 40.4   Smokeless tobacco: Never  Vaping Use   Vaping status: Never Used  Substance and Sexual Activity   Alcohol use: Yes    Alcohol/week: 0.0 standard drinks of alcohol    Comment: rarely   Drug use: No   Sexual activity: Not on file  Other Topics Concern   Not on file  Social History Narrative   Lives at home with husband   Social Drivers of Health   Financial Resource Strain: Low Risk  (01/29/2024)   Overall Financial Resource Strain (CARDIA)    Difficulty of Paying Living Expenses: Not very hard  Food Insecurity: No Food Insecurity (01/29/2024)   Hunger Vital Sign    Worried About  Running Out of Food in the Last Year: Never true    Ran Out of Food  in the Last Year: Never true  Transportation Needs: No Transportation Needs (01/29/2024)   PRAPARE - Administrator, Civil Service (Medical): No    Lack of Transportation (Non-Medical): No  Physical Activity: Inactive (01/30/2024)   Exercise Vital Sign    Days of Exercise per Week: 0 days    Minutes of Exercise per Session: 0 min  Stress: No Stress Concern Present (01/29/2024)   [employer] of Occupational Health - Occupational Stress Questionnaire    Feeling of Stress: Only a little  Social Connections: Socially Integrated (01/29/2024)   Social Connection and Isolation Panel    Frequency of Communication with Friends and Family: More than three times a week    Frequency of Social Gatherings with Friends and Family: Once a week    Attends Religious Services: More than 4 times per year    Active Member of [employer] or Organizations: Yes    Attends Engineer, structural: More than 4 times per year    Marital Status: Married    Tobacco Counseling Counseling given: Not Answered    Clinical Intake:  Pre-visit preparation completed: Yes  Pain : 0-10 Pain Score: 3  Pain Type: Chronic pain Pain Location: Back (has arthritis) Pain Descriptors / Indicators: Aching Pain Onset: More than a month ago Pain Frequency: Constant     BMI - recorded: 38.27 Nutritional Risks: None Diabetes: No  Lab Results  Component Value Date   HGBA1C 6.0 10/11/2023   HGBA1C 6.0 06/03/2023   HGBA1C 5.8 01/31/2023     How often do you need to have someone help you when you read instructions, pamphlets, or other written materials from your doctor or pharmacy?: 1 - Never  Interpreter Needed?: No  Information entered by :: R. Bevin Mayall LPN   Activities of Daily Living     01/24/2024   11:16 AM  In your present state of health, do you have any difficulty performing the following activities:  Hearing? 0  Vision? 0   Comment glasses  Difficulty concentrating or making decisions? 0  Walking or climbing stairs? 0  Dressing or bathing? 0  Doing errands, shopping? 0  Preparing Food and eating ? N  Using the Toilet? N  In the past six months, have you accidently leaked urine? Y  Do you have problems with loss of bowel control? N  Managing your Medications? N  Managing your Finances? N  Housekeeping or managing your Housekeeping? Y    Patient Care Team: Dellar Fenton, MD as PCP - General (Internal Medicine) Wenona Hamilton, MD as PCP - Cardiology (Cardiology)  I have updated your Care Teams any recent Medical Services you may have received from other providers in the past year.     Assessment:   This is a routine wellness examination for [place].  Hearing/Vision screen Hearing Screening - Comments:: No issues Vision Screening - Comments:: glasses   Goals Addressed             This Visit's Progress    Patient Stated       Wants to lose some weight       Depression Screen     01/30/2024    9:45 AM 02/11/2023    8:11 AM 01/25/2023   10:36 AM 10/05/2022    1:04 PM 09/09/2022    4:20 PM 06/03/2022    3:12 PM 03/23/2022   12:07 PM  PHQ 2/9 Scores  PHQ - 2 Score 0 0 0 0 0 0 0  PHQ- 9 Score 3 1 0        Fall Risk     01/24/2024   11:16 AM 01/25/2023   10:39 AM 01/24/2023    9:55 AM 10/05/2022    1:03 PM 09/09/2022    4:19 PM  Fall Risk   Falls in the past year? 1 1 1  0 0  Number falls in past yr: 1 0 0 0 0  Injury with Fall? 1 0 0 0 0  Risk for fall due to : History of fall(s);Impaired balance/gait History of fall(s)  No Fall Risks No Fall Risks  Follow up Falls evaluation completed;Falls prevention discussed Falls prevention discussed;Falls evaluation completed  Falls evaluation completed Falls evaluation completed    MEDICARE RISK AT HOME:  Medicare Risk at Home Any stairs in or around the home?: (Patient-Rptd) Yes If so, are there any without handrails?: No Home free of  loose throw rugs in walkways, pet beds, electrical cords, etc?: (Patient-Rptd) Yes Adequate lighting in your home to reduce risk of falls?: (Patient-Rptd) Yes Life alert?: (Patient-Rptd) No Use of a cane, walker or w/c?: (Patient-Rptd) No Grab bars in the bathroom?: (Patient-Rptd) No Shower chair or bench in shower?: (Patient-Rptd) No Elevated toilet seat or a handicapped toilet?: (Patient-Rptd) No  TIMED UP AND GO:  Was the test performed?  No  Cognitive Function: 6CIT completed        01/30/2024    9:55 AM 01/25/2023   10:45 AM 01/21/2022    2:59 PM  6CIT Screen  What Year? 0 points 0 points 0 points  What month? 0 points 0 points 0 points  What time? 0 points 0 points 0 points  Count back from 20 0 points 0 points 0 points  Months in reverse 0 points 2 points 0 points  Repeat phrase 2 points 2 points   Total Score 2 points 4 points     Immunizations Immunization History  Administered Date(s) Administered   Fluad Quad(high Dose 65+) 06/26/2019, 07/08/2020, 05/18/2021, 06/03/2022   Fluad Trivalent(High Dose 65+) 06/09/2023   Influenza, High Dose Seasonal PF 09/30/2017   Influenza,inj,Quad PF,6+ Mos 06/18/2013   Influenza-Unspecified 04/30/2014, 06/21/2016   PFIZER(Purple Top)SARS-COV-2 Vaccination 12/19/2019, 01/16/2020, 12/28/2020   Pneumococcal Polysaccharide-23 06/26/2019   Zoster Recombinant(Shingrix) 12/28/2020    Screening Tests Health Maintenance  Topic Date Due   DTaP/Tdap/Td (1 - Tdap) Never done   Pneumococcal Vaccine: 50+ Years (2 of 2 - PCV) 06/25/2020   Zoster Vaccines- Shingrix (2 of 2) 02/22/2021   COVID-19 Vaccine (4 - 2024-25 season) 04/17/2023   Medicare Annual Wellness (AWV)  01/25/2024   INFLUENZA VACCINE  03/16/2024   MAMMOGRAM  12/27/2024   Colonoscopy  01/10/2033   DEXA SCAN  Completed   Hepatitis C Screening  Completed   HPV VACCINES  Aged Out   Meningococcal B Vaccine  Aged Out    Health Maintenance  Health Maintenance Due  Topic  Date Due   DTaP/Tdap/Td (1 - Tdap) Never done   Pneumococcal Vaccine: 50+ Years (2 of 2 - PCV) 06/25/2020   Zoster Vaccines- Shingrix (2 of 2) 02/22/2021   COVID-19 Vaccine (4 - 2024-25 season) 04/17/2023   Medicare Annual Wellness (AWV)  01/25/2024   Health Maintenance Items Addressed: Discussed the need to get second shingles vaccine and update Tetanus (tdap) vaccine. Patient declines covid vaccine. Patient stated that she had a local reaction to pneumonia vaccine so have put that vaccine off.    Additional Screening:  Vision Screening: Recommended annual  ophthalmology exams for early detection of glaucoma and other disorders of the eye. Up to date [facility] Would you like a referral to an eye doctor? No    Dental Screening: Recommended annual dental exams for proper oral hygiene  Community Resource Referral / Chronic Care Management: CRR required this visit?  No   CCM required this visit?  No   Plan:    I have personally reviewed and noted the following in the patient's chart:   Medical and social history Use of alcohol, tobacco or illicit drugs  Current medications and supplements including opioid prescriptions. Patient is not currently taking opioid prescriptions. Functional ability and status Nutritional status Physical activity Advanced directives List of other physicians Hospitalizations, surgeries, and ER visits in previous 12 months Vitals Screenings to include cognitive, depression, and falls Referrals and appointments  In addition, I have reviewed and discussed with patient certain preventive protocols, quality metrics, and best practice recommendations. A written personalized care plan for preventive services as well as general preventive health recommendations were provided to patient.   Felicitas Horse, LPN   5/73/2202   After Visit Summary: (MyChart) Due to this being a telephonic visit, the after visit summary with patients personalized plan was offered  to patient via MyChart   Notes: Nothing significant to report at this time.

## 2024-02-02 DIAGNOSIS — M47816 Spondylosis without myelopathy or radiculopathy, lumbar region: Secondary | ICD-10-CM | POA: Diagnosis not present

## 2024-02-02 DIAGNOSIS — M5416 Radiculopathy, lumbar region: Secondary | ICD-10-CM | POA: Diagnosis not present

## 2024-02-02 DIAGNOSIS — M48062 Spinal stenosis, lumbar region with neurogenic claudication: Secondary | ICD-10-CM | POA: Diagnosis not present

## 2024-02-07 ENCOUNTER — Ambulatory Visit: Payer: Self-pay | Admitting: Internal Medicine

## 2024-02-07 ENCOUNTER — Other Ambulatory Visit: Payer: Self-pay

## 2024-02-07 ENCOUNTER — Other Ambulatory Visit (INDEPENDENT_AMBULATORY_CARE_PROVIDER_SITE_OTHER): Payer: Medicare Other

## 2024-02-07 DIAGNOSIS — R739 Hyperglycemia, unspecified: Secondary | ICD-10-CM | POA: Diagnosis not present

## 2024-02-07 DIAGNOSIS — E7800 Pure hypercholesterolemia, unspecified: Secondary | ICD-10-CM | POA: Diagnosis not present

## 2024-02-07 LAB — BASIC METABOLIC PANEL WITH GFR
BUN: 17 mg/dL (ref 6–23)
CO2: 30 meq/L (ref 19–32)
Calcium: 9.5 mg/dL (ref 8.4–10.5)
Chloride: 105 meq/L (ref 96–112)
Creatinine, Ser: 1.11 mg/dL (ref 0.40–1.20)
GFR: 49.21 mL/min — ABNORMAL LOW (ref >60.00)
Glucose, Bld: 96 mg/dL (ref 70–99)
Potassium: 4.2 meq/L (ref 3.5–5.1)
Sodium: 141 meq/L (ref 135–145)

## 2024-02-07 LAB — HEPATIC FUNCTION PANEL
ALT: 15 U/L (ref 0–35)
AST: 15 U/L (ref 0–37)
Albumin: 4 g/dL (ref 3.5–5.2)
Alkaline Phosphatase: 88 U/L (ref 39–117)
Bilirubin, Direct: 0.1 mg/dL (ref 0.0–0.3)
Total Bilirubin: 0.5 mg/dL (ref 0.2–1.2)
Total Protein: 7.6 g/dL (ref 6.0–8.3)

## 2024-02-07 LAB — LIPID PANEL
Cholesterol: 155 mg/dL (ref 0–200)
HDL: 53 mg/dL (ref >39.00)
LDL Cholesterol: 78 mg/dL (ref 0–99)
NonHDL: 102.3
Total CHOL/HDL Ratio: 3
Triglycerides: 122 mg/dL (ref 0.0–149.0)
VLDL: 24.4 mg/dL (ref 0.0–40.0)

## 2024-02-07 LAB — HEMOGLOBIN A1C: Hgb A1c MFr Bld: 6.1 % (ref 4.6–6.5)

## 2024-02-09 ENCOUNTER — Ambulatory Visit: Payer: Medicare Other | Admitting: Internal Medicine

## 2024-02-09 ENCOUNTER — Ambulatory Visit (INDEPENDENT_AMBULATORY_CARE_PROVIDER_SITE_OTHER): Admitting: Internal Medicine

## 2024-02-09 VITALS — BP 126/70 | HR 76 | Resp 16 | Ht 65.0 in | Wt 232.6 lb

## 2024-02-09 DIAGNOSIS — I5033 Acute on chronic diastolic (congestive) heart failure: Secondary | ICD-10-CM | POA: Diagnosis not present

## 2024-02-09 DIAGNOSIS — I471 Supraventricular tachycardia, unspecified: Secondary | ICD-10-CM

## 2024-02-09 DIAGNOSIS — R059 Cough, unspecified: Secondary | ICD-10-CM | POA: Diagnosis not present

## 2024-02-09 DIAGNOSIS — K219 Gastro-esophageal reflux disease without esophagitis: Secondary | ICD-10-CM

## 2024-02-09 DIAGNOSIS — I700 Atherosclerosis of aorta: Secondary | ICD-10-CM | POA: Diagnosis not present

## 2024-02-09 DIAGNOSIS — E7800 Pure hypercholesterolemia, unspecified: Secondary | ICD-10-CM | POA: Diagnosis not present

## 2024-02-09 DIAGNOSIS — G720 Drug-induced myopathy: Secondary | ICD-10-CM | POA: Diagnosis not present

## 2024-02-09 DIAGNOSIS — T466X5A Adverse effect of antihyperlipidemic and antiarteriosclerotic drugs, initial encounter: Secondary | ICD-10-CM

## 2024-02-09 DIAGNOSIS — N6099 Unspecified benign mammary dysplasia of unspecified breast: Secondary | ICD-10-CM

## 2024-02-09 DIAGNOSIS — F439 Reaction to severe stress, unspecified: Secondary | ICD-10-CM

## 2024-02-09 DIAGNOSIS — D473 Essential (hemorrhagic) thrombocythemia: Secondary | ICD-10-CM

## 2024-02-09 DIAGNOSIS — I10 Essential (primary) hypertension: Secondary | ICD-10-CM

## 2024-02-09 DIAGNOSIS — R739 Hyperglycemia, unspecified: Secondary | ICD-10-CM | POA: Diagnosis not present

## 2024-02-09 MED ORDER — PREDNISONE 10 MG PO TABS: ORAL_TABLET | ORAL | 0 refills | Status: DC

## 2024-02-09 MED ORDER — AZITHROMYCIN 250 MG PO TABS: ORAL_TABLET | ORAL | 0 refills | Status: AC

## 2024-02-09 NOTE — Progress Notes (Signed)
 Subjective:    Patient ID: Nicole Sims, female    DOB: March 02, 1951, 73 y.o.   MRN: [id number]  Patient here for  Chief Complaint  Patient presents with   Medical Management of Chronic Issues    HPI Here for a follow up appt - follow up regarding hypertension, SVT and hypercholesterolemia. Had f/u with Dr Selinda. - f/u atypical lobular hyperplasia - both breasts. Last evlauated 07/05/23. Recommended f/u mammo in 6 months and to continue f/u with oncology. On anastrozole . Saw Dr Jacobo 11/01/23 - stable. Continue anastrozole  - with plans to complete  treatment in 07/2027. F/u with Dr Cesar 01/05/24 - stable. Recommended bilateral diagnostic mammogram in one year. Had colonoscopy 01/11/23 - fair prep. Recommended f/u colonoscopy in 2 years. Had f/u with Dr Darron 12/28/22 - paroxysmal SVT - continue toprol  100mg  q day. Stable. F/u chronic diastolic heart failure. Per note, not taking lasix . Continues on repatha . Seeing Dr Clois - last 08/23/23 - f/u peripheral neuropathy with neuropathic pain - moderate stenosis L4-5. He discussed treatment options - spinal cord stimulator vs L4-5 decompression.  had f/u with physiatry 02/02/24 - does not want to have surgery and insurance does not cover spinal cord stimulator. Recommended continuing tylenol  and lyrica. Planning for [facility]. is s/p left middle trigger finger release 12/07/23. Reevaluated 12/21/23 - some persistent pain and stiffness. Also s/p injection - right De Quervain's tenosynovitis. F/u 01/25/24 - recommended right trigger finger release. Reports increased stress with her husband's health issues. Also reports starting two weeks ago, she started feeling bad. Increased cough. No fever. Increased nasal congestion and sinus congestion. Runny nose. Increased drainage. Sore throat. Chest congestion. Some wheezing. No vomiting or diarrhea.    Past Medical History:  Diagnosis Date   Anemia    Anginal pain (HCC)    Anxiety    Asthma    Cataract cortical,  senile    CHF (congestive heart failure) (HCC)    Chronic headaches    Diastolic heart failure (HCC)    Diverticulosis    Environmental allergies    GERD (gastroesophageal reflux disease)    Heart murmur    Hyperglycemia    Hyperlipidemia    Hypertension    Leukocytosis    Obesity    Osteoarthritis    Palpitations    Restless leg syndrome    Sleep apnea    Stroke (HCC)    Thrombocytosis    Urinary incontinence    mixed   Venous insufficiency    Vitamin D  deficiency    Wears dentures    partial upper   Past Surgical History:  Procedure Laterality Date   BLADDER SURGERY     x2   [facility]   BREAST BIOPSY Left 06/24/2022   Stereo Bx, X-clip, neg   BREAST BIOPSY Left 06/24/2022   Stereo Bx, ribbon clip, atypical lobular hyperplasis   BREAST BIOPSY  06/24/2022   Stereo Bx, Coil Clip, neg   BREAST BIOPSY Left 06/24/2022   MM LT BREAST BX W LOC DEV 1ST LESION IMAGE BX SPEC STEREO GUIDE 06/24/2022 [facility]   BREAST BIOPSY Left 06/24/2022   MM LT BREAST BX W LOC DEV EA AD LESION IMG BX SPEC STEREO GUIDE 06/24/2022 [facility]   BREAST BIOPSY Left 06/24/2022   MM LT BREAST BX W LOC DEV EA AD LESION IMG BX SPEC STEREO GUIDE 06/24/2022 [facility]   BREAST CYST ASPIRATION Bilateral 2005   approximate year   CARDIAC CATHETERIZATION     [place]  CATARACT EXTRACTION W/PHACO Right 04/24/2020   Procedure: CATARACT EXTRACTION PHACO AND INTRAOCULAR LENS PLACEMENT (IOC) RIGHT;  Surgeon: Jaye Fallow, MD;  Location: [facility];  Service: Ophthalmology;  Laterality: Right;  6.75 0:37.3   CATARACT EXTRACTION W/PHACO Left 01/27/2021   Procedure: CATARACT EXTRACTION PHACO AND INTRAOCULAR LENS PLACEMENT (IOC) LEFT;  Surgeon: Jaye Fallow, MD;  Location: [facility];  Service: Ophthalmology;  Laterality: Left;  6.75 00:43.9   CERVICAL CONE BIOPSY     CIS   CHOLECYSTECTOMY     COLONOSCOPY WITH PROPOFOL  N/A 07/19/2016   Procedure:  COLONOSCOPY WITH PROPOFOL ;  Surgeon: Lamar ONEIDA Holmes, MD;  Location: [facility];  Service: Endoscopy;  Laterality: N/A;   COLONOSCOPY WITH PROPOFOL  N/A 10/20/2018   Procedure: COLONOSCOPY WITH PROPOFOL ;  Surgeon: Gaylyn Gladis PENNER, MD;  Location: [facility];  Service: Endoscopy;  Laterality: N/A;   COLONOSCOPY WITH PROPOFOL  N/A 01/11/2023   Procedure: COLONOSCOPY WITH PROPOFOL ;  Surgeon: Maryruth Ole ONEIDA, MD;  Location: [facility]OSCOPY;  Service: Endoscopy;  Laterality: N/A;   ESOPHAGOGASTRODUODENOSCOPY N/A 05/12/2021   Procedure: ESOPHAGOGASTRODUODENOSCOPY (EGD);  Surgeon: Maryruth Ole ONEIDA, MD;  Location: [facility] ENDOSCOPY;  Service: Endoscopy;  Laterality: N/A;   ESOPHAGOGASTRODUODENOSCOPY (EGD) WITH PROPOFOL  N/A 07/19/2016   Procedure: ESOPHAGOGASTRODUODENOSCOPY (EGD) WITH PROPOFOL ;  Surgeon: Lamar ONEIDA Holmes, MD;  Location[facility]COPY;  Service: Endoscopy;  Laterality: N/A;   FLEXIBLE SIGMOIDOSCOPY     KNEE ARTHROSCOPY  08/13/2008   knee replacement and revision     left   RECTOCELE REPAIR     RIGHT/LEFT HEART CATH AND CORONARY ANGIOGRAPHY Bilateral 04/10/2018   Procedure: RIGHT/LEFT HEART CATH AND CORONARY ANGIOGRAPHY;  Surgeon: Darron Deatrice LABOR, MD;  Location: [facility];  Service: Cardiovascular;  Laterality: Bilateral;   ROTATOR CUFF REPAIR     bilateral   SHOULDER SURGERY  11/17/2005   TONSILLECTOMY  1962   TRIGGER FINGER RELEASE Left 12/2023   VAGINAL HYSTERECTOMY  1974   abnormal pap and carcinoma in situ   Family History  Problem Relation Age of Onset   Diabetes Mellitus II Father    Thyroid  disease Father    Alzheimer's disease Father    Breast cancer Maternal Aunt    Hypertension Mother    Heart Problems Brother    Leukemia Maternal Aunt    Alzheimer's disease Paternal Aunt    Alzheimer's disease Paternal Uncle    Alzheimer's disease Paternal Uncle    Alzheimer's disease Paternal Uncle    Alzheimer's disease Paternal Aunt    Colon cancer  Neg Hx    Kidney cancer Neg Hx    Bladder Cancer Neg Hx    Social History   Socioeconomic History   Marital status: Married    Spouse name: Not on file   Number of children: Not on file   Years of education: Not on file   Highest education level: 11th grade  Occupational History   Occupation: retired  Tobacco Use   Smoking status: Former    Current packs/day: 0.00    Average packs/day: 1 pack/day for 15.0 years (15.0 ttl pk-yrs)    Types: Cigarettes    Start date: 08/16/1968    Quit date: 08/17/1983    Years since quitting: 40.5   Smokeless tobacco: Never  Vaping Use   Vaping status: Never Used  Substance and Sexual Activity   Alcohol use: Yes    Alcohol/week: 0.0 standard drinks of alcohol    Comment: rarely   Drug use: No   Sexual  activity: Not on file  Other Topics Concern   Not on file  Social History Narrative   Lives at home with husband   Social Drivers of Health   Financial Resource Strain: Low Risk  (01/29/2024)   Overall Financial Resource Strain (CARDIA)    Difficulty of Paying Living Expenses: Not very hard  Food Insecurity: No Food Insecurity (01/29/2024)   Hunger Vital Sign    Worried About Running Out of Food in the Last Year: Never true    Ran Out of Food in the Last Year: Never true  Transportation Needs: No Transportation Needs (01/29/2024)   PRAPARE - Administrator, Civil Service (Medical): No    Lack of Transportation (Non-Medical): No  Physical Activity: Inactive (01/30/2024)   Exercise Vital Sign    Days of Exercise per Week: 0 days    Minutes of Exercise per Session: 0 min  Stress: No Stress Concern Present (01/29/2024)   [employer] of Occupational Health - Occupational Stress Questionnaire    Feeling of Stress: Only a little  Social Connections: Socially Integrated (01/29/2024)   Social Connection and Isolation Panel    Frequency of Communication with Friends and Family: More than three times a week    Frequency of Social  Gatherings with Friends and Family: Once a week    Attends Religious Services: More than 4 times per year    Active Member of [employer] or Organizations: Yes    Attends Engineer, structural: More than 4 times per year    Marital Status: Married     Review of Systems  Constitutional:  Negative for appetite change and fever.  HENT:  Positive for congestion and postnasal drip.   Respiratory:  Negative for chest tightness and shortness of breath.        Some chest congestion and cough.   Cardiovascular:  Negative for chest pain, palpitations and leg swelling.  Gastrointestinal:  Negative for abdominal pain, diarrhea, nausea and vomiting.  Genitourinary:  Negative for difficulty urinating and dysuria.  Musculoskeletal:  Negative for joint swelling and myalgias.  Skin:  Negative for color change and rash.  Neurological:  Negative for dizziness and headaches.  Psychiatric/Behavioral:  Negative for agitation and dysphoric mood.        Increased stress.        Objective:     BP 126/70   Pulse 76   Resp 16   Ht 5' 5 (1.651 m)   Wt 232 lb 9.6 oz (105.5 kg)   SpO2 98%   BMI 38.71 kg/m  Wt Readings from Last 3 Encounters:  02/09/24 232 lb 9.6 oz (105.5 kg)  01/30/24 230 lb (104.3 kg)  11/01/23 233 lb (105.7 kg)    Physical Exam Vitals reviewed.  Constitutional:      General: She is not in acute distress.    Appearance: Normal appearance.  HENT:     Head: Normocephalic and atraumatic.     Right Ear: External ear normal.     Left Ear: External ear normal.     Mouth/Throat:     Pharynx: No oropharyngeal exudate or posterior oropharyngeal erythema.   Eyes:     General: No scleral icterus.       Right eye: No discharge.        Left eye: No discharge.     Conjunctiva/sclera: Conjunctivae normal.   Neck:     Thyroid : No thyromegaly.   Cardiovascular:     Rate and Rhythm: Normal rate  and regular rhythm.  Pulmonary:     Effort: No respiratory distress.     Breath  sounds: Normal breath sounds. No wheezing.     Comments: Increased cough with forced expiration.  Abdominal:     General: Bowel sounds are normal.     Palpations: Abdomen is soft.     Tenderness: There is no abdominal tenderness.   Musculoskeletal:        General: No swelling or tenderness.     Cervical back: Neck supple. No tenderness.  Lymphadenopathy:     Cervical: No cervical adenopathy.   Skin:    Findings: No erythema or rash.   Neurological:     Mental Status: She is alert.   Psychiatric:        Mood and Affect: Mood normal.        Behavior: Behavior normal.         Outpatient Encounter Medications as of 02/09/2024  Medication Sig   azithromycin  (ZITHROMAX ) 250 MG tablet Take 2 tablets on day 1, then 1 tablet daily on days 2 through 5   CALCIUM  PO Take by mouth daily.   Cholecalciferol  (VITAMIN D3 PO) Take by mouth daily.   MAGNESIUM  PO Take by mouth daily.   predniSONE  (DELTASONE ) 10 MG tablet Take 4 tablets x 1 day and then decrease by 1/2 tablet per day until down to zero mg.   acetaminophen  (TYLENOL ) 500 MG tablet Take 500 mg by mouth every 6 (six) hours as needed.   albuterol  (VENTOLIN  HFA) 108 (90 Base) MCG/ACT inhaler Inhale 2 puffs into the lungs every 4 (four) hours as needed for wheezing or shortness of breath.   alendronate  (FOSAMAX ) 70 MG tablet Take 1 tablet (70 mg total) by mouth once a week. Take with a full glass of water on an empty stomach.   anastrozole  (ARIMIDEX ) 1 MG tablet Take 1 tablet (1 mg total) by mouth daily.   aspirin  325 MG tablet Take 325 mg by mouth daily.   budesonide -formoterol  (SYMBICORT ) 80-4.5 MCG/ACT inhaler Inhale 2 puffs into the lungs 2 (two) times daily.   clobetasol  cream (TEMOVATE ) 0.05 % Apply 1 Application topically 2 (two) times daily.   Evolocumab  (REPATHA ) 140 MG/ML SOSY INJECT 140 MG UNDER THE SKIN EVERY 14 DAYS   furosemide  (LASIX ) 20 MG tablet Take 1 tablet (20 mg) by mouth once daily as needed for weight gain of 3  lbs or more overnight   linaclotide  (LINZESS ) 290 MCG CAPS capsule Take 290 mcg by mouth daily before breakfast.   metoprolol  succinate (TOPROL -XL) 100 MG 24 hr tablet TAKE 1 TABLET BY MOUTH DAILY   Omega-3 Fatty Acids (FISH OIL) 1000 MG CAPS Take 1,000 mg by mouth.   ondansetron  (ZOFRAN ) 4 MG tablet Take 1 tablet (4 mg total) by mouth 2 (two) times daily as needed for nausea or vomiting.   pregabalin (LYRICA) 50 MG capsule Take by mouth. Take 1 capsule by mouth in the morning, 1 capsule at afternoon, 2 capsules at bedtime.   RABEprazole (ACIPHEX) 20 MG tablet Take 20 mg by mouth 2 (two) times daily.   traMADol (ULTRAM) 50 MG tablet Take 1 tablet by mouth 2 (two) times daily as needed. (Patient not taking: Reported on 01/30/2024)   venlafaxine  XR (EFFEXOR -XR) 150 MG 24 hr capsule TAKE 1 CAPSULE BY MOUTH DAILY  WITH BREAKFAST   No facility-administered encounter medications on file as of 02/09/2024.     Lab Results  Component Value Date   WBC 6.4 10/11/2023  HGB 12.5 10/11/2023   HCT 38.6 10/11/2023   PLT 439.0 (H) 10/11/2023   GLUCOSE 96 02/07/2024   CHOL 155 02/07/2024   TRIG 122.0 02/07/2024   HDL 53.00 02/07/2024   LDLDIRECT 172.8 09/17/2013   LDLCALC 78 02/07/2024   ALT 15 02/07/2024   AST 15 02/07/2024   NA 141 02/07/2024   K 4.2 02/07/2024   CL 105 02/07/2024   CREATININE 1.11 02/07/2024   BUN 17 02/07/2024   CO2 30 02/07/2024   TSH 2.05 06/03/2023   INR 1.0 06/11/2019   HGBA1C 6.1 02/07/2024    MM 3D DIAGNOSTIC MAMMOGRAM BILATERAL BREAST Result Date: 12/28/2023 CLINICAL DATA:  Patient is status post 3 site stereotactic guided biopsy in November 2023. Two of the sites demonstrated benign results. in the LEFT breast at site of RIBBON marker, there is focal ALH. Patient presents for follow-up of additional probably benign calcifications of the non sample site in the breast and RIGHT breast. This was initiated in November 2023. Patient reports she appreciated lymph in the in  the LEFT outer breast which cannot find today but the area is now painful. EXAM: DIGITAL DIAGNOSTIC BILATERAL MAMMOGRAM WITH TOMOSYNTHESIS AND CAD; ULTRASOUND LEFT BREAST LIMITED TECHNIQUE: Bilateral digital diagnostic mammography and breast tomosynthesis was performed. The images were evaluated with computer-aided detection. ; Targeted ultrasound examination of the left breast was performed. COMPARISON:  Previous exam(s). ACR Breast Density Category c: The breasts are heterogeneously dense, which may obscure small masses. FINDINGS: Spot compression tomosynthesis views were obtained over the area of focal pain/palpable concern in the LEFT breast. No suspicious mammographic finding is identified in this area. Spot views of the LEFT breast demonstrate no new suspicious findings adjacent to the RIBBON biopsy marking clip in the LEFT outer breast at middle depth [facility]). Spot magnification views demonstrate a similar appearance of residual loosely grouped calcifications in the central breast at middle to posterior depth. Spot magnification views of the RIGHT breast demonstrate decreased conspicuity of previously described calcifications in the upper breast posterior depth. No new suspicious findings are noted in this area. No new suspicious findings are noted in the RIGHT breast. On physical exam, no suspicious mass is appreciated. Targeted LEFT breast ultrasound was performed in the area of painful/palpable concern at the outer breast. No suspicious solid or cystic mass is identified. IMPRESSION: 1. Stable probably benign bilateral calcifications. Recommend follow-up diagnostic mammogram in 1 year. This will establish over 2 years of observation. 2. No new suspicious findings are noted adjacent to the site of biopsy-proven atypical lobular hyperplasia in the LEFT breast (RIBBON clip). Supplemental screening with breast MRI with and without contrast could be considered. 3. No mammographic or sonographic evidence of  malignancy at the site of painful/palpable concern in the LEFT breast. Any further workup of the patient's symptoms should be based on the clinical assessment. 4. Breast pain is a common condition, which will often resolve on its own without intervention. It can be affected by hormonal changes, medication side effect, weight changes and fit of the bra. Pain may also be referred from other adjacent areas of the body. Breast pain may be improved by wearing adequate well-fitting support, over-the-counter topical and oral NSAID medication, low-fat diet, and ice/heat as needed. Studies have shown an improvement in cyclic pain with use of evening primrose oil, vitamin D  and vitamin E. Clinical follow-up recommended to discuss any further work-up recommendations and appropriate treatment. RECOMMENDATION: Recommend bilateral diagnostic mammogram (with RIGHT and LEFT breast ultrasound if deemed  necessary) in 1 year. Patient is due for contralateral screening at this point in time. The American Cancer Society recommends annual MRI and mammography in patients with an estimated lifetime risk of developing breast cancer greater than 20 - 25%, or who are known or suspected to be positive for the breast cancer gene. I have discussed the findings and recommendations with the patient. If applicable, a reminder letter will be sent to the patient regarding the next appointment. BI-RADS CATEGORY  3: Probably benign. Electronically Signed   By: Corean Salter M.D.   On: 12/28/2023 10:20   US  LIMITED ULTRASOUND INCLUDING AXILLA LEFT BREAST  Result Date: 12/28/2023 CLINICAL DATA:  Patient is status post 3 site stereotactic guided biopsy in November 2023. Two of the sites demonstrated benign results. in the LEFT breast at site of RIBBON marker, there is focal ALH. Patient presents for follow-up of additional probably benign calcifications of the non sample site in the breast and RIGHT breast. This was initiated in November 2023.  Patient reports she appreciated lymph in the in the LEFT outer breast which cannot find today but the area is now painful. EXAM: DIGITAL DIAGNOSTIC BILATERAL MAMMOGRAM WITH TOMOSYNTHESIS AND CAD; ULTRASOUND LEFT BREAST LIMITED TECHNIQUE: Bilateral digital diagnostic mammography and breast tomosynthesis was performed. The images were evaluated with computer-aided detection. ; Targeted ultrasound examination of the left breast was performed. COMPARISON:  Previous exam(s). ACR Breast Density Category c: The breasts are heterogeneously dense, which may obscure small masses. FINDINGS: Spot compression tomosynthesis views were obtained over the area of focal pain/palpable concern in the LEFT breast. No suspicious mammographic finding is identified in this area. Spot views of the LEFT breast demonstrate no new suspicious findings adjacent to the RIBBON biopsy marking clip in the LEFT outer breast at middle depth [facility]). Spot magnification views demonstrate a similar appearance of residual loosely grouped calcifications in the central breast at middle to posterior depth. Spot magnification views of the RIGHT breast demonstrate decreased conspicuity of previously described calcifications in the upper breast posterior depth. No new suspicious findings are noted in this area. No new suspicious findings are noted in the RIGHT breast. On physical exam, no suspicious mass is appreciated. Targeted LEFT breast ultrasound was performed in the area of painful/palpable concern at the outer breast. No suspicious solid or cystic mass is identified. IMPRESSION: 1. Stable probably benign bilateral calcifications. Recommend follow-up diagnostic mammogram in 1 year. This will establish over 2 years of observation. 2. No new suspicious findings are noted adjacent to the site of biopsy-proven atypical lobular hyperplasia in the LEFT breast (RIBBON clip). Supplemental screening with breast MRI with and without contrast could be considered. 3.  No mammographic or sonographic evidence of malignancy at the site of painful/palpable concern in the LEFT breast. Any further workup of the patient's symptoms should be based on the clinical assessment. 4. Breast pain is a common condition, which will often resolve on its own without intervention. It can be affected by hormonal changes, medication side effect, weight changes and fit of the bra. Pain may also be referred from other adjacent areas of the body. Breast pain may be improved by wearing adequate well-fitting support, over-the-counter topical and oral NSAID medication, low-fat diet, and ice/heat as needed. Studies have shown an improvement in cyclic pain with use of evening primrose oil, vitamin D  and vitamin E. Clinical follow-up recommended to discuss any further work-up recommendations and appropriate treatment. RECOMMENDATION: Recommend bilateral diagnostic mammogram (with RIGHT and LEFT breast ultrasound if  deemed necessary) in 1 year. Patient is due for contralateral screening at this point in time. The American Cancer Society recommends annual MRI and mammography in patients with an estimated lifetime risk of developing breast cancer greater than 20 - 25%, or who are known or suspected to be positive for the breast cancer gene. I have discussed the findings and recommendations with the patient. If applicable, a reminder letter will be sent to the patient regarding the next appointment. BI-RADS CATEGORY  3: Probably benign. Electronically Signed   By: Corean Salter M.D.   On: 12/28/2023 10:20       Assessment & Plan:  Aortic atherosclerosis (HCC) Assessment & Plan: Continue repatha     Hypercholesteremia Assessment & Plan: Continue repatha . Low cholesterol diet and exercise. Follow lipid panel.   Orders: -     Lipid panel; Future -     Hepatic function panel; Future -     Basic metabolic panel with GFR; Future  Hyperglycemia -     Hemoglobin A1c; Future  Essential  hypertension Assessment & Plan: Continue toprol . Blood pressure as outlined. No changes in medication today. Follow.   Orders: -     TSH; Future  Atypical hyperplasia of breast Assessment & Plan:  Had f/u with Dr Selinda. - f/u atypical lobular hyperplasia - both breasts. Last evlauated 07/05/23. Recommended f/u mammo in 6 months and to continue f/u with oncology. On anastrozole . Saw Dr Jacobo 11/01/23 - stable. Continue anastrozole  - with plans to complete  treatment in 07/2027. F/u with Dr Cesar 01/05/24 - stable. Recommended bilateral diagnostic mammogram in one year.    CHF NYHA class III (symptoms with mildly strenuous activities), acute on chronic, diastolic (HCC) Assessment & Plan: Repeat echo 11/07/21 showed LVEF 65-70%, no WMA, normal RV function. Had f/u with Dr Darron 12/28/22 - paroxysmal SVT - continue toprol  100mg  q day.  Stable. F/u chronic diastolic heart failure.  Lasix  prn - per report. No evidence of volume overload on exam. Follow.    Cough, unspecified type Assessment & Plan: Persistent congestion and cough as outlined. Given persistence for two weeks, will cover for bacterial infection. Continue flonase . Symbicort  as directed. Albuterol  inhaler if needed.  Zpak. Prednisone  taper. Follow.  Call with update.    Essential (hemorrhagic) thrombocythemia (HCC) Assessment & Plan: Has been worked up by hematology.  Follow cbc.    Gastroesophageal reflux disease without esophagitis Assessment & Plan: Continues on aciphex. No acid reflux reported.    PSVT (paroxysmal supraventricular tachycardia) (HCC) Assessment & Plan: Has a history of PSVT.  Has seen Dr. Ewing.  Continue metoprolol .  Denies increased heart rate or palpitations. No changes in medication today. Follow.    Statin myopathy Assessment & Plan: Previous joint aching.  Unable to take statin medication.  Continue repatha . Tolerating.    Stress Assessment & Plan: Increase stress with her husband's  medical issues.  Discussed.  Continue effexor . Follow.    Other orders -     predniSONE ; Take 4 tablets x 1 day and then decrease by 1/2 tablet per day until down to zero mg.  Dispense: 18 tablet; Refill: 0 -     Azithromycin ; Take 2 tablets on day 1, then 1 tablet daily on days 2 through 5  Dispense: 6 tablet; Refill: 0     Allena Hamilton, MD

## 2024-02-09 NOTE — Patient Instructions (Signed)
 Saline nasal spray - flush nose 1-2 x day  Flonase  nasal spray - 2 sprays each nostril one time per day. Do this in the evening.   Continue robitussin.

## 2024-02-12 ENCOUNTER — Encounter: Payer: Self-pay | Admitting: Internal Medicine

## 2024-02-12 NOTE — Assessment & Plan Note (Signed)
Continue repatha.

## 2024-02-12 NOTE — Assessment & Plan Note (Signed)
 Previous joint aching.  Unable to take statin medication.  Continue repatha . Tolerating.

## 2024-02-12 NOTE — Assessment & Plan Note (Signed)
Continue repatha.  Low cholesterol diet and exercise.  Follow lipid panel.  

## 2024-02-12 NOTE — Assessment & Plan Note (Signed)
 Has a history of PSVT.  Has seen Dr. Ewing.  Continue metoprolol .  Denies increased heart rate or palpitations. No changes in medication today. Follow.

## 2024-02-12 NOTE — Assessment & Plan Note (Signed)
 Had f/u with Dr Selinda. - f/u atypical lobular hyperplasia - both breasts. Last evlauated 07/05/23. Recommended f/u mammo in 6 months and to continue f/u with oncology. On anastrozole . Saw Dr Jacobo 11/01/23 - stable. Continue anastrozole  - with plans to complete  treatment in 07/2027. F/u with Dr Cesar 01/05/24 - stable. Recommended bilateral diagnostic mammogram in one year.

## 2024-02-12 NOTE — Assessment & Plan Note (Signed)
 Continue toprol . Blood pressure as outlined. No changes in medication today. Follow.

## 2024-02-12 NOTE — Assessment & Plan Note (Signed)
 Persistent congestion and cough as outlined. Given persistence for two weeks, will cover for bacterial infection. Continue flonase . Symbicort  as directed. Albuterol  inhaler if needed.  Zpak. Prednisone  taper. Follow.  Call with update.

## 2024-02-12 NOTE — Assessment & Plan Note (Signed)
 Continues on aciphex. No acid reflux reported.

## 2024-02-12 NOTE — Assessment & Plan Note (Signed)
Has been worked up by hematology.  Follow cbc.  

## 2024-02-12 NOTE — Assessment & Plan Note (Signed)
 Increase stress with her husband's medical issues.  Discussed.  Continue effexor . Follow.

## 2024-02-12 NOTE — Assessment & Plan Note (Signed)
 Repeat echo 11/07/21 showed LVEF 65-70%, no WMA, normal RV function. Had f/u with Dr Darron 12/28/22 - paroxysmal SVT - continue toprol  100mg  q day.  Stable. F/u chronic diastolic heart failure.  Lasix  prn - per report. No evidence of volume overload on exam. Follow.

## 2024-02-15 ENCOUNTER — Other Ambulatory Visit: Payer: Self-pay

## 2024-02-15 MED ORDER — METOPROLOL SUCCINATE ER 100 MG PO TB24: 100.0000 mg | ORAL_TABLET | Freq: Every day | ORAL | 0 refills | Status: DC

## 2024-02-24 DIAGNOSIS — M65331 Trigger finger, right middle finger: Secondary | ICD-10-CM | POA: Diagnosis not present

## 2024-03-25 ENCOUNTER — Other Ambulatory Visit: Payer: Self-pay | Admitting: Cardiovascular Disease

## 2024-03-26 ENCOUNTER — Ambulatory Visit: Attending: Medical | Admitting: Medical

## 2024-03-26 ENCOUNTER — Encounter: Payer: Self-pay | Admitting: Medical

## 2024-03-26 VITALS — BP 130/77 | HR 77 | Ht 65.0 in | Wt 230.0 lb

## 2024-03-26 DIAGNOSIS — I5032 Chronic diastolic (congestive) heart failure: Secondary | ICD-10-CM | POA: Diagnosis not present

## 2024-03-26 DIAGNOSIS — I471 Supraventricular tachycardia, unspecified: Secondary | ICD-10-CM | POA: Diagnosis not present

## 2024-03-26 DIAGNOSIS — R0602 Shortness of breath: Secondary | ICD-10-CM | POA: Diagnosis not present

## 2024-03-26 DIAGNOSIS — E782 Mixed hyperlipidemia: Secondary | ICD-10-CM | POA: Diagnosis not present

## 2024-03-26 DIAGNOSIS — I10 Essential (primary) hypertension: Secondary | ICD-10-CM

## 2024-03-26 MED ORDER — METOPROLOL SUCCINATE ER 100 MG PO TB24: 100.0000 mg | ORAL_TABLET | Freq: Every day | ORAL | 3 refills | Status: DC

## 2024-03-26 NOTE — Progress Notes (Signed)
 Cardiology Office Note   Date:  03/26/2024  ID:  [name], DOB 04/04/1951, MRN [id number] PCP: Glendia Shad, MD  [facility] Providers Cardiologist:  Deatrice Cage, MD    History of Present Illness Nicole Sims is a 73 y.o. female with a h/o pSVT, chronic diastolic heart failure, HLD, OSA not on CPAP, HTN, and obesity who presents for 1 year follow-up.   The patient had a right and left cardiac cath in August 2019 which showed normal coronary arteries and normal LV systolic function.  Right heart cath showed mildly elevated filling pressures with pulmonary capillary wedge pressure of 13 mmHg, no pulmonary hypertension and normal cardiac output.  CTA of the chest showed no evidence of pulmonary embolism.  She had a previous heart monitor for palpitations that showed normal sinus rhythm with a heart rate of 80 bpm.  She had short runs of SVT, the longest lasted 7 beats.  Toprol  was increased with improvement of symptoms.  Echocardiogram in March 2023 showed normal LVSF, normal diastolic function and no significant valvular abnormalities.  The patient was last seen in May 2024 and was overall doing well from a cardiac perspective.  Today, the patient reports orthostatic symptoms for the last 6 months to a year. She denies syncope or falls from the symptoms. She reports pain in her shoulder blades and underneath her left breast, that sounds MSK in nature. She reports shortness of breath, worse sometimes when she walks and talks. She has occasional lower leg swelling. She is not longer using her CPAP machine.   Studies Reviewed EKG Interpretation Date/Time:  Monday March 26 2024 11:36:30 EDT Ventricular Rate:  77 PR Interval:  138 QRS Duration:  68 QT Interval:  382 QTC Calculation: 432 R Axis:   8  Text Interpretation: Normal sinus rhythm Low voltage QRS When compared with ECG of 28-Sep-2021 17:25, Criteria for Anterior infarct are no longer Present Confirmed by Franchester,  Karrigan Messamore ([id number]) on 03/26/2024 11:40:12 AM    Echo 10/2021 1. Left ventricular ejection fraction, by estimation, is 65 to 70%. The  left ventricle has normal function. The left ventricle has no regional  Bert motion abnormalities. Left ventricular diastolic parameters were  normal. The average left ventricular  global longitudinal strain is -21.2 %. The global longitudinal strain is  normal.   2. Right ventricular systolic function is normal. The right ventricular  size is normal.   3. The mitral valve is normal in structure. No evidence of mitral valve  regurgitation.   4. The aortic valve is tricuspid. Aortic valve regurgitation is not  visualized.   5. The inferior vena cava is normal in size with greater than 50%  respiratory variability, suggesting right atrial pressure of 3 mmHg.   Comparison(s): LVEF 60-65%.  Echo 2021   1. Left ventricular ejection fraction, by estimation, is 60 to 65%. The  left ventricle has normal function. The left ventricle has no regional  Apo motion abnormalities. There is mild left ventricular hypertrophy.  Left ventricular diastolic parameters  are consistent with Grade I diastolic dysfunction (impaired relaxation).   2. Right ventricular systolic function is normal. The right ventricular  size is normal. Tricuspid regurgitation signal is inadequate for assessing  PA pressure.   3. The mitral valve is normal in structure. Trivial mitral valve  regurgitation.   4. The aortic valve was not well visualized. Aortic valve regurgitation  is not visualized. No aortic stenosis is present.   Echo 06/2019 1. Left  ventricular ejection fraction, by visual estimation, is 65 to  70%. The left ventricle has normal function. There is mildly increased  left ventricular hypertrophy.   2. Left ventricular diastolic parameters are consistent with Grade I  diastolic dysfunction (impaired relaxation).   3. Global right ventricle has normal systolic function.The right   ventricular size is normal. Right vetricular Holston thickness was not  assessed.   4. Left atrial size was normal.   5. Right atrial size was normal.   6. The pericardium was not well visualized.   7. The mitral valve is grossly normal. No evidence of mitral valve  regurgitation. No evidence of mitral stenosis.   8. The tricuspid valve is not well visualized. Tricuspid valve  regurgitation is trivial.   9. The aortic valve was not well visualized. Aortic valve regurgitation  is not visualized. There is no aortic valve stenosis.  10. The pulmonic valve was not well visualized. Pulmonic valve  regurgitation is not visualized.  11. TR signal is inadequate for assessing pulmonary artery systolic  pressure.  12. The interatrial septum was not well visualized.      Physical Exam VS:  BP 130/77 (BP Location: Left Arm, Patient Position: Sitting, Cuff Size: Normal)   Pulse 77   Ht 5' 5 (1.651 m)   Wt 230 lb (104.3 kg)   SpO2 97%   BMI 38.27 kg/m        Wt Readings from Last 3 Encounters:  03/26/24 230 lb (104.3 kg)  02/09/24 232 lb 9.6 oz (105.5 kg)  01/30/24 230 lb (104.3 kg)    GEN: Well nourished, well developed in no acute distress NECK: No JVD; No carotid bruits CARDIAC: RRR, no murmurs, rubs, gallops RESPIRATORY:  Clear to auscultation without rales, wheezing or rhonchi  ABDOMEN: Soft, non-tender, non-distended EXTREMITIES:  No edema; No deformity   ASSESSMENT AND PLAN  pSVT She denies heart racing or palpitations. Continue Toprol  100mg  daily.   SOB Chronic diastolic heart failure She reports shortness of breath on exertion. She is euvolemic on exam. Continue lasix  as needed for swelling. Echo from 2023 showed LVEF 65-70%. She is no longer using her CPAP, which is likely contributing to SOB. I will update an echocardiogram.  HLD LDL 78. Continue Repatha .   HTN BP is normal today. She reports orthostatic symptoms. She is on Toprol  100mg  daily. I recommended she take  this at night. Recommend good hydration.         Dispo: Follow-up  Signed, Eva Griffo VEAR Fishman, PA-C

## 2024-03-26 NOTE — Patient Instructions (Addendum)
 Medication Instructions:  Your physician recommends the following medication changes.  START TAKING: Metoprolol  100 mg by mouth daily at bedtime    *If you need a refill on your cardiac medications before your next appointment, please call your pharmacy*  Lab Work: No labs ordered today    Testing/Procedures: Your physician has requested that you have an echocardiogram. Echocardiography is a painless test that uses sound waves to create images of your heart. It provides your doctor with information about the size and shape of your heart and how well your heart's chambers and valves are working.   You may receive an ultrasound enhancing agent through an IV if needed to better visualize your heart during the echo. This procedure takes approximately one hour.  There are no restrictions for this procedure.  This will take place at [address] [facility]) [address], [place] [address]  Please note: We ask at that you not bring children with you during ultrasound (echo/ vascular) testing. Due to room size and safety concerns, children are not allowed in the ultrasound rooms during exams. Our front office staff cannot provide observation of children in our lobby area while testing is being conducted. An adult accompanying a patient to their appointment will only be allowed in the ultrasound room at the discretion of the ultrasound technician under special circumstances. We apologize for any inconvenience.   Follow-Up: At [facility], you and your health needs are our priority.  As part of our continuing mission to provide you with exceptional heart care, our providers are all part of one team.  This team includes your primary Cardiologist (physician) and Advanced Practice Providers or APPs (Physician Assistants and Nurse Practitioners) who all work together to provide you with the care you need, when you need it.  Your next appointment:   6 month(s)  Provider:   You may  see Deatrice Cage, MD or one of the following Advanced Practice Providers on your designated Care Team:   Cadence Benedict, PA-C

## 2024-04-27 DIAGNOSIS — M898X9 Other specified disorders of bone, unspecified site: Secondary | ICD-10-CM | POA: Diagnosis not present

## 2024-04-27 DIAGNOSIS — M79671 Pain in right foot: Secondary | ICD-10-CM | POA: Diagnosis not present

## 2024-04-27 DIAGNOSIS — M19071 Primary osteoarthritis, right ankle and foot: Secondary | ICD-10-CM | POA: Diagnosis not present

## 2024-05-07 DIAGNOSIS — R197 Diarrhea, unspecified: Secondary | ICD-10-CM | POA: Diagnosis not present

## 2024-05-09 ENCOUNTER — Emergency Department

## 2024-05-09 ENCOUNTER — Encounter: Payer: Self-pay | Admitting: *Deleted

## 2024-05-09 ENCOUNTER — Other Ambulatory Visit: Payer: Self-pay

## 2024-05-09 ENCOUNTER — Emergency Department
Admission: EM | Admit: 2024-05-09 | Discharge: 2024-05-09 | Disposition: A | Discharge status: Home / Self Care | Source: Ambulatory Visit | Attending: Emergency Medicine | Admitting: Emergency Medicine

## 2024-05-09 ENCOUNTER — Ambulatory Visit

## 2024-05-09 DIAGNOSIS — E876 Hypokalemia: Secondary | ICD-10-CM | POA: Insufficient documentation

## 2024-05-09 DIAGNOSIS — D72829 Elevated white blood cell count, unspecified: Secondary | ICD-10-CM | POA: Insufficient documentation

## 2024-05-09 DIAGNOSIS — M47812 Spondylosis without myelopathy or radiculopathy, cervical region: Secondary | ICD-10-CM | POA: Diagnosis not present

## 2024-05-09 DIAGNOSIS — M542 Cervicalgia: Secondary | ICD-10-CM | POA: Diagnosis not present

## 2024-05-09 DIAGNOSIS — E860 Dehydration: Secondary | ICD-10-CM | POA: Diagnosis not present

## 2024-05-09 DIAGNOSIS — N189 Chronic kidney disease, unspecified: Secondary | ICD-10-CM | POA: Insufficient documentation

## 2024-05-09 DIAGNOSIS — I509 Heart failure, unspecified: Secondary | ICD-10-CM | POA: Insufficient documentation

## 2024-05-09 DIAGNOSIS — R197 Diarrhea, unspecified: Secondary | ICD-10-CM | POA: Diagnosis not present

## 2024-05-09 LAB — CBC
HCT: 40.4 % (ref 36.0–46.0)
Hemoglobin: 13 g/dL (ref 12.0–15.0)
MCH: 27.7 pg (ref 26.0–34.0)
MCHC: 32.2 g/dL (ref 30.0–36.0)
MCV: 86 fL (ref 80.0–100.0)
Platelets: 464 K/uL — ABNORMAL HIGH (ref 150–400)
RBC: 4.7 MIL/uL (ref 3.87–5.11)
RDW: 13.6 % (ref 11.5–15.5)
WBC: 11.6 K/uL — ABNORMAL HIGH (ref 4.0–10.5)
nRBC: 0 % (ref 0.0–0.2)

## 2024-05-09 LAB — COMPREHENSIVE METABOLIC PANEL WITH GFR
ALT: 12 U/L (ref 0–44)
AST: 18 U/L (ref 15–41)
Albumin: 3.8 g/dL (ref 3.5–5.0)
Alkaline Phosphatase: 83 U/L (ref 38–126)
Anion gap: 15 (ref 5–15)
BUN: 15 mg/dL (ref 8–23)
CO2: 19 mmol/L — ABNORMAL LOW (ref 22–32)
Calcium: 9.3 mg/dL (ref 8.9–10.3)
Chloride: 102 mmol/L (ref 98–111)
Creatinine, Ser: 1.45 mg/dL — ABNORMAL HIGH (ref 0.44–1.00)
GFR, Estimated: 38 mL/min — ABNORMAL LOW (ref >60)
Glucose, Bld: 95 mg/dL (ref 70–99)
Potassium: 3.2 mmol/L — ABNORMAL LOW (ref 3.5–5.1)
Sodium: 136 mmol/L (ref 135–145)
Total Bilirubin: 0.6 mg/dL (ref 0.0–1.2)
Total Protein: 7.7 g/dL (ref 6.5–8.1)

## 2024-05-09 LAB — URINALYSIS, ROUTINE W REFLEX MICROSCOPIC
Bilirubin Urine: NEGATIVE
Glucose, UA: NEGATIVE mg/dL
Hgb urine dipstick: NEGATIVE
Ketones, ur: 5 mg/dL — AB
Nitrite: NEGATIVE
Protein, ur: 30 mg/dL — AB
Specific Gravity, Urine: 1.026 (ref 1.005–1.030)
pH: 5 (ref 5.0–8.0)

## 2024-05-09 LAB — LIPASE, BLOOD: Lipase: 20 U/L (ref 11–51)

## 2024-05-09 LAB — TSH: TSH: 1.491 u[IU]/mL (ref 0.350–4.500)

## 2024-05-09 LAB — MAGNESIUM: Magnesium: 1.9 mg/dL (ref 1.7–2.4)

## 2024-05-09 MED ORDER — ACETAMINOPHEN 500 MG PO TABS
1000.0000 mg | ORAL_TABLET | Freq: Once | ORAL | Status: AC
  Administered 2024-05-09: 1000 mg via ORAL
  Filled 2024-05-09: qty 2

## 2024-05-09 MED ORDER — SODIUM CHLORIDE 0.9 % IV BOLUS
500.0000 mL | Freq: Once | INTRAVENOUS | Status: AC
  Administered 2024-05-09: 500 mL via INTRAVENOUS

## 2024-05-09 MED ORDER — POTASSIUM CHLORIDE 20 MEQ PO PACK
40.0000 meq | PACK | Freq: Once | ORAL | Status: AC
  Administered 2024-05-09: 40 meq via ORAL
  Filled 2024-05-09: qty 2

## 2024-05-09 MED ORDER — LIDOCAINE 5 % EX PTCH
1.0000 | MEDICATED_PATCH | Freq: Once | CUTANEOUS | Status: DC
  Administered 2024-05-09: 1 via TRANSDERMAL
  Filled 2024-05-09: qty 1

## 2024-05-09 NOTE — Discharge Instructions (Addendum)
 You are seen in the emergency department for diarrhea and neck pain.  You recently had stool studies that were negative.  You did look dehydrated in the emergency department.  Is importantly stay hydrated and drink plenty of fluids.  Drink fluids like Pedialyte or Gatorade and something that has some electrolytes in it.  Take Imodium  for your diarrhea.  You are given information to follow-up closely with gastroenterology and your primary care physician.  Return to the emergency department for any worsening symptoms.  The CT scan of your neck did not show any abnormalities.  You can use over-the-counter Lidoderm  patches if the Lidoderm  patch helped today.  You can get these over-the-counter.  You can alternate Motrin and Tylenol  for pain control.  Pain control:  Ibuprofen (motrin/aleve/advil) - You can take 3 tablets (600 mg) every 6 hours as needed for pain/fever.  Acetaminophen  (tylenol ) - You can take 2 extra strength tablets (1000 mg) every 6 hours as needed for pain/fever.  You can alternate these medications or take them together.  Make sure you eat food/drink water when taking these medications.  Your potassium was mildly low when checked today.  Make sure to follow up with a primary doctor to follow up your labs.  Make sure to eat food high in potassium and magnesium  - examples - potatoes, spinach, bananas, beans, avocadoes, oranges, nuts.

## 2024-05-09 NOTE — ED Triage Notes (Signed)
 Referred to ED from [facility] for c/o diarrhea x 15 days, confusion, weakness.  VS wnl.

## 2024-05-09 NOTE — ED Provider Notes (Signed)
 [facility] Provider Note    Event Date/Time   First MD Initiated Contact with Patient 05/09/24 2141     (approximate)   History   Diarrhea   HPI  Nicole Sims is a 73 y.o. female past medical history significant for CKD, CHF, presents to the emergency department with diarrhea.  Patient states that she has been having ongoing diarrhea for the past 15 days.  Just did a stool study with her primary care physician that resulted as negative.  No recent antibiotic use.  States that she is feeling very dehydrated and not feeling well secondary to her ongoing diarrhea.  States that she has had intermittent abdominal pain.  No nausea or vomiting.  Also complaining of significant pain to her neck and stiffness.  Hurts whenever she moves her neck.  No falls or trauma.  Denies any significant headache.  No change in vision or slurring of speech.  Denies any extremity numbness or weakness.  No recent travel.  Denies any blood in her stool.     Physical Exam   Triage Vital Signs: ED Triage Vitals  Encounter Vitals Group     BP 05/09/24 1732 137/89     Girls Systolic BP Percentile --      Girls Diastolic BP Percentile --      Boys Systolic BP Percentile --      Boys Diastolic BP Percentile --      Pulse Rate 05/09/24 1732 85     Resp 05/09/24 1732 18     Temp 05/09/24 1732 98 F (36.7 C)     Temp Source 05/09/24 1732 Oral     SpO2 05/09/24 1732 96 %     Weight 05/09/24 1729 221 lb (100.2 kg)     Height 05/09/24 1729 5' 5 (1.651 m)     Head Circumference --      Peak Flow --      Pain Score 05/09/24 1729 10     Pain Loc --      Pain Education --      Exclude from Growth Chart --     Most recent vital signs: Vitals:   05/09/24 2255 05/09/24 2350  BP:  (!) 137/59  Pulse:  86  Resp:  16  Temp:  98 F (36.7 C)  SpO2: 97% 100%    Physical Exam Constitutional:      Appearance: She is well-developed.     Comments: Tearful  HENT:     Head:  Atraumatic.  Eyes:     Conjunctiva/sclera: Conjunctivae normal.  Neck:     Comments: Tenderness to palpation to bilateral paraspinous muscles.  No midline cervical spine tenderness.  Does have pain with range of motion to her neck.  No meningismus. Cardiovascular:     Rate and Rhythm: Regular rhythm.  Pulmonary:     Effort: No respiratory distress.     Breath sounds: No wheezing.  Abdominal:     General: There is no distension.     Tenderness: There is no abdominal tenderness. There is no right CVA tenderness, left CVA tenderness or guarding.  Musculoskeletal:        General: Normal range of motion.     Cervical back: Normal range of motion. Tenderness present.     Right lower leg: No edema.     Left lower leg: No edema.  Skin:    General: Skin is warm.     Capillary Refill: Capillary refill takes less than  2 seconds.  Neurological:     General: No focal deficit present.     Mental Status: She is alert. Mental status is at baseline.     IMPRESSION / MDM / ASSESSMENT AND PLAN / ED COURSE  I reviewed the triage vital signs and the nursing notes.  On chart review patient had a recent GI pathogen panel that was negative  Differential diagnosis including dehydration, electrolyte abnormality, hyperthyroidism, chronic diarrhea  EKG  I, Clotilda Punter, the attending physician, personally viewed and interpreted this ECG.   Rate: Normal  Rhythm: Normal sinus  Axis: Normal  Intervals: Normal  ST&T Change: None  No tachycardic or bradycardic dysrhythmias while on cardiac telemetry.  RADIOLOGY CT scan of the cervical spine with no acute fracture.  Read as no acute findings  LABS (all labs ordered are listed, but only abnormal results are displayed) Labs interpreted as -    Labs Reviewed  COMPREHENSIVE METABOLIC PANEL WITH GFR - Abnormal; Notable for the following components:      Result Value   Potassium 3.2 (*)    CO2 19 (*)    Creatinine, Ser 1.45 (*)    GFR, Estimated  38 (*)    All other components within normal limits  CBC - Abnormal; Notable for the following components:   WBC 11.6 (*)    Platelets 464 (*)    All other components within normal limits  URINALYSIS, ROUTINE W REFLEX MICROSCOPIC - Abnormal; Notable for the following components:   Color, Urine AMBER (*)    APPearance HAZY (*)    Ketones, ur 5 (*)    Protein, ur 30 (*)    Leukocytes,Ua SMALL (*)    Bacteria, UA RARE (*)    Non Squamous Epithelial PRESENT (*)    All other components within normal limits  LIPASE, BLOOD  MAGNESIUM   TSH     MDM  On lab work patient appears mildly dehydrated with ketones in her urine.  Does have leukocyte esterase in her urine and some WBCs but had many squamous cells and likely is not a clean-catch, will send for culture but have a low suspicion for urinary tract infection is currently asymptomatic.  Mild leukocytosis of 11.6.  Normal platelets.  Mild acute kidney injury with a creatinine elevated up to 1.4 from baseline of 1.1.  BUN within normal limits.  Potassium mildly low at 3.2.  CO2 is significantly low at 19, likely in the setting of significant diarrhea.  Has a normal anion gap.  Magnesium  level within normal limits and has normal thyroid  studies  Patient's abdominal exam is currently nontender to palpation with no rebound or guarding.  Patient was given IV fluids and electrolyte replacement.  Discussed close follow-up as an outpatient with gastroenterology and possible need for further workup as an outpatient.  I have very low suspicion for acute diverticulosis or intra-abdominal abscess given she does not have any pain at this time.  Abdomen exam is nontender to palpation.  For her neck pain most likely with cervical strain.  CT scan of the neck with no acute findings.  Afebrile and no signs of manage dizziness, have a low suspicion for meningitis.  Normal neurologic exam have low suspicion for cord impingement or radiculopathy.  Lidoderm  patch  applied.  Given information for outpatient follow-up.  Discussed close follow-up with gastroenterology and primary care provider.  Discussed return for any ongoing or worsening symptoms.  No questions at time of discharge.     PROCEDURES:  Critical Care performed: No  Procedures  Patient's presentation is most consistent with acute presentation with potential threat to life or bodily function.   MEDICATIONS ORDERED IN ED: Medications  potassium chloride  (KLOR-CON ) packet 40 mEq (40 mEq Oral Given 05/09/24 2234)  sodium chloride  0.9 % bolus 500 mL (0 mLs Intravenous Stopped 05/09/24 2345)  acetaminophen  (TYLENOL ) tablet 1,000 mg (1,000 mg Oral Given 05/09/24 2235)    FINAL CLINICAL IMPRESSION(S) / ED DIAGNOSES   Final diagnoses:  Diarrhea, unspecified type  Neck pain  Hypokalemia  Dehydration     Rx / DC Orders   ED Discharge Orders     None        Note:  This document was prepared using Dragon voice recognition software and may include unintentional dictation errors.   Suzanne Kirsch, MD 05/12/24 1146

## 2024-05-09 NOTE — ED Triage Notes (Addendum)
 Pt to triage via wheelchair.  Pt has diarrhea for 2 weeks.  No vomiting.  Intermittent abd pain.   Pt also has neck  pain.  No known injury   neck pain since yesterday.   Pt alert  speech clear.

## 2024-05-11 DIAGNOSIS — R197 Diarrhea, unspecified: Secondary | ICD-10-CM | POA: Diagnosis not present

## 2024-05-11 DIAGNOSIS — K600 Acute anal fissure: Secondary | ICD-10-CM | POA: Diagnosis not present

## 2024-05-11 NOTE — Progress Notes (Signed)
 PATIENT PROFILE: Nicole Sims is a 73 y.o. female who presents for a visit to the GI clinic at [facility] [place]  for a visit to follow up  PROBLEM LIST:  Patient Active Problem List  Diagnosis  . Myofascial pain  . Radiculitis, cervical  . Cervical spondylosis  . Combined hyperlipidemia  . Gastroesophageal reflux disease without esophagitis  . Benign essential HTN  . OSA (obstructive sleep apnea)  . Morbid (severe) obesity due to excess calories (CMS/HHS-HCC)  . Spondylosis of cervical region without myelopathy or radiculopathy  . Stable angina pectoris ()  . Venous insufficiency of both lower extremities  . Primary osteoarthritis involving multiple joints  . ANA positive  . Myalgia  . Thrombocytosis  . Achilles tendon pain  . Aortic atherosclerosis ()  . CHF NYHA class III (symptoms with mildly strenuous activities), acute on chronic, diastolic (CMS/HHS-HCC)  . CKD (chronic kidney disease) stage 3, GFR 30-59 ml/min (CMS/HHS-HCC)  . Osteoarthritis  . Osteoporosis    Endoscopic History:  Colonoscopy 5/24- Dr Maryruth- fair prep- normal TI, diverticulosis, hemorrhoids Esophageal Manometry 12/23- [facility]- Impression:            - Hypotensive upper esophageal sphincter.                         - The esophageal manometry was otherwise normal.    - normal liquid esophageal transit.   EGD 05/12/21- Dr Maryruth- while numular lesions throughout the esophagus. One of the esophageal biopsies had fungal component the other did not. There was no H pylori, Barretts, dysplasia/malignancy. She was treated with fluconazole    Colonoscopy  10/20/18- Dr Tisa habit changes- 2 serrated polyps and negative colonic random biopsies. 5y repeat recommended  EGD 2017- Dr Elliot/ab pain-  with erosive gastritis and esophagitis. No h pylori, dysplasia, malignancy Colonsocopy 2017- Dr Elliot/ab pain- hyperplastic polyp  MBSS 12/23- Clinical Impression Pt presents with adequate oropharyngeal  abilities when  consuming thin liquids via cup and straw, puree and soft solids. When  consuming whole barium tablet with thin liquids, pt benefited from  consuming additional sips of thin liquids to aid in tablet transit thru  pharynx. This is likely d/t to pt's chronic xerostomia. Education provided  to pt on importance of wetting her mouth and possibly taking her medicine  with puree. All education was completed with pt's questions answred to her  satisfaction.  SLP Visit Diagnosis Dysphagia, unspecified (R13.10)    US  12/23/21- no acute processes. Hepatic steatosis  CTA chest 2/23 for sob at the time - no esophageal abnormalities/lymphadenopathy/upper abdominal abnormalities.    Barium swallow 01/13/21- IMPRESSION:  1. Tertiary contractions as can be seen with esophageal spasm or  presbyesophagus.  2. Severe gastroesophageal reflux   INTERVAL HISTORY:  Nicole Sims was last seen on 10/12/22 Rectal bleeding/history of adenoma/presence of anal fissure. Limit straining. Use gentle hygiene measures. Continue linzess  290mcg poqd. Consider adding daily fiber/+/- stool softening agents. Anal fissure instruction sheets given. Schedule colonoscopy.  Procedure information given: indications, benefits, risks- including, but not limited to bleeding, infection, perforation, difficulty with sedation, were discussed with the patient, and they are agreeable to the procedure. Will treat the anal fissure with nifedipine 0.3%/lidocaine  cream bid x 8w. Gerd/history of dysphagia- stable on current Follow up as needed after procedure, sooner if problems .   Since the last visit, reports she was moving her bowels twice daily until about 2.5w ago- maybe a little constipated and having to  strain- but then for unknown reason started having diarrhea- had several daily initially- 12-15/d, nonbloody nonblack; had intermittent low grade temp at night but not during day. Tmax 100.8. no fever last night. Occasional abdominal  cramp but no pain. Some nausea no vomiting. States her diarrhea has slowed some-now mostly postprandial- has had 5 stools today and yesterday No med changes or sick contacts. No night sweats. Had a negative covid test. Had [facility] 2010. Has had some increase in her chronic neck pain with this  GI Appt was offerred however patient could not do- stool pcr negative; did go to ED 2d ago-  labs pretty stable- did show some mild dehydration- patient received some iv fluids. Hgb normal. There was mild leukocytosis (11.2) and thrombocytosis (464) Cspine ct unremarkable- patient states she was discharged for follow up with GI.     Medications:  Outpatient Encounter Medications as of 05/11/2024  Medication Sig Dispense Refill  . acetaminophen  (TYLENOL ) 500 MG tablet Take 1,000 mg by mouth as needed for Pain    . albuterol  (PROVENTIL ) 2.5 mg /3 mL (0.083 %) nebulizer solution Take 2.5 mg by nebulization every 6 (six) hours as needed    . albuterol  90 mcg/actuation inhaler Inhale 2 inhalations into the lungs every 6 (six) hours as needed for Wheezing.    . alendronate  (FOSAMAX ) 70 MG tablet Take 70 mg by mouth every 7 (seven) days    . anastrozole  (ARIMIDEX ) 1 mg tablet Take 1 mg by mouth once daily    . aspirin  325 MG tablet Take 325 mg by mouth once daily    . budesonide -formoteroL  (SYMBICORT ) 80-4.5 mcg/actuation inhaler Inhale 2 inhalations into the lungs 2 (two) times daily    . cetirizine (ZYRTEC) 10 MG tablet Take 10 mg by mouth once daily    . cholecalciferol  (VITAMIN D3) 1,000 unit capsule Take 2,000 Units by mouth    . clobetasol  (TEMOVATE ) 0.05 % ointment APPLY EXTERNALLY TO THE AFFECTED AREA TWICE DAILY 30 g 0  . cyanocobalamin  (VITAMIN B12) 1000 MCG tablet Take 1,000 mcg by mouth once daily    . docosahexaenoic acid-epa 120-180 mg Cap Take 1,000 mg by mouth Take 1,000 mg by mouth.    . evolocumab  (REPATHA  SYRINGE) 140 mg/mL Syrg Inject 140 mg subcutaneously every 14 (fourteen) days    .  FUROsemide  (LASIX ) 20 MG tablet Take 1 tablet (20 mg) by mouth once daily as needed for weight gain of 3 lbs or more overnight    . ipratropium (ATROVENT ) 0.06 % nasal spray     . Lactobacillus acidophilus (PROBIOTIC) 10 billion cell Cap Take by mouth Take 100 mcg by mouth.    . linaCLOtide  (LINZESS ) 290 mcg capsule Take by mouth Take 290 mcg by mouth daily before breakfast.    . melatonin 3 mg tablet Take by mouth    . methylPREDNISolone (MEDROL DOSEPACK) 4 mg tablet Follow package directions. 21 tablet 0  . metoprolol  succinate (TOPROL -XL) 100 MG XL tablet Take 100 mg by mouth once daily    . montelukast  (SINGULAIR ) 10 mg tablet Take by mouth Take 1 tablet (10 mg total) by mouth at bedtime.    . potassium chloride  (KLOR-CON ) 10 MEQ ER tablet Take 1 tablet (10 meq) by mouth once daily as needed only when taking lasix  (furosemide )    . pregabalin (LYRICA) 50 MG capsule TAKE 1 CAPSULE BY MOUTH IN THE MORNING, 1 CAPSULE IN THE AFTERNOON AND 2 CAPSULES NIGHTLY 120 capsule 5  . pregabalin (LYRICA) 50  MG capsule TAKE 1 CAPSULE BY MOUTH EVERY MORNING, TAKE 1 CAPSULE DAILY AT NOON, THEN TAKE 2 CAPSULES BY MOUTH EVERY NIGHT 120 capsule 5  . PREVIDENT 5000 ENAMEL PROTECT 1.1-5 % USE TO BRUSH TEETH IN THE MORNING AND AT NIGHT. DO NOT EAT OR DRINK 30 MINUTES AFTER USE    . RABEprazole (ACIPHEX) 20 mg EC tablet TAKE 1 TABLET BY MOUTH TWICE  DAILY 1/2 HOUR BEFORE MEALS AS  DIRECTED 200 tablet 2  . sucralfate  (CARAFATE ) 1 gram tablet Take by mouth at bedtime as needed    . tiZANidine (ZANAFLEX) 4 MG tablet 1/2-1 bid prn 60 tablet 1  . traMADoL (ULTRAM) 50 mg tablet Take 1 tablet (50 mg total) by mouth 2 (two) times daily as needed 14 tablet 0  . venlafaxine  (EFFEXOR -XR) 150 MG XR capsule TAKE 1 CAPSULE BY MOUTH EVERY DAY WITH BREAKFAST     No facility-administered encounter medications on file as of 05/11/2024.     ALLERGIES: Augmentin [amoxicillin-pot clavulanate], Bactrim [sulfamethoxazole-trimethoprim ],  Doxycycline , Hydrocodone-acetaminophen , Levaquin  [levofloxacin ], Pseudoephedrine-dm-guaifenesin, Pseudophedrine w/carbinoxamine [carbinoxamine-pseudoephedrine], Relafen [nabumetone], Shellfish containing products, and Sudafed [pseudoephedrine hcl]   PMHx:  Past Medical History:  Diagnosis Date  . Allergic state ?  SABRA Anemia   . Anxiety   . Arthritis 1992  . Asthma without status asthmaticus (HHS-HCC)   . Asthma without status asthmaticus (HHS-HCC) 1988  . Cataract cortical, senile 2014  . CHF (congestive heart failure) (CMS/HHS-HCC)   . CKD (chronic kidney disease) stage 3, GFR 30-59 ml/min (CMS/HHS-HCC) 01/03/2021  . Depression    ((Pt Qnr Sub: Patient response)) ((Pt Qnr Sub: Patient response)) ((Pt Qnr Sub: Patient response)) ((Pt Qnr Sub: Patient response)) ((Pt Qnr Sub: Patient response)) ((Pt Qnr Sub: Patient response)) Hospitalized x2, last one 20 years ago.  . Diverticulosis   . GERD (gastroesophageal reflux disease)   . GERD (gastroesophageal reflux disease) 1990  . Heart murmur slight  . Hyperglycemia   . Hyperlipidemia   . Hyperlipidemia   . Hypertension   . Hypertension 2002  . Migraine headache   . Obesity   . Osteoarthritis   . Osteoporosis 11/02/2023   Dr Jacobo 10/2023 - start fosamax . F/u bone density one year.  . Palpitations   . Radiculitis, cervical   . Restless leg syndrome   . Seasonal allergies    She had formal allergy testing for this and did take shots for awhile under Dr. Lemond Dines, which she stated did not help her symptoms.  . Sleep apnea   . Thrombocytosis   . Urinary incontinence, mixed    History of  . Venous insufficiency of both lower extremities 12/20/2017  . Vitamin D  deficiency      PSHx: Past Surgical History:  Procedure Laterality Date  . TONSILLECTOMY  1962  . HYSTERECTOMY VAGINAL  1974   Secondary to abnormal pap smear and carcinoma in situ.  SABRA HYSTERECTOMY  1976  . FLEXIBLE SIGMOIDOSCOPY  03/21/2001  . BREAST SURGERY  2005    Cyst drained  . Shoulder surgery  11/17/2005  . ARTHROSCOPY KNEE  08/13/2008  . CHOLECYSTECTOMY  2010  . COLONOSCOPY  01/27/2009   07/19/2003, 07/01/1998; Int Hemorrhoids, Diverticulosis: CBF 01/2019  . EGD  12/07/2011   01/27/2009, 12/08/2005, 01/05/2002, 07/01/1998  . COLONOSCOPY  07/19/2016   Hyperplastic Polyp: CBF 07/2026  . EGD  07/19/2016   Gastritis, Esophagitis: No repeat per RTE  . COLONOSCOPY  10/20/2018   Dr. EMERSON Mariner @ [facility] - Sessile Serrated Polyps, 5 yr rpt  per [facility]  . EGD  05/12/2021   Gastritis/Candidal esophagitis.Treated/Repeat as needed/CTL  . Colon @ [facility]  01/11/2023   Colonoscopy with fair prep. Will need repeat colonoscopy in 2 years/CTL  . trigger finger release Left 12/07/2023   Middle digit, Dr. Ozell Flake  . trigger finger release Right 02/24/2024   Rt Long-Dr. Ozell Flake  . Trigger finger release Right 02/24/2024   Middle finger, Dr. Ozell Flake  . ARTHROSCOPIC ROTATOR CUFF REPAIR Bilateral   . BLADDER SURGERY    . bladder surgery    . cardiac catheterization    . CERVICAL CONE BIOPSY    . CHOLECYSTECTOMY     Dr. Sankar  . CONIZATION CERVIX BY COLD KNIFE     For CIS  . JOINT REPLACEMENT  [id number]   both knees  . Left knee replacement    . retrocele repair    . SIGMOIDOSCOPY    . UPPER GASTROINTESTINAL ENDOSCOPY       FAMILY HISTORY: Family History  Problem Relation Name Age of Onset  . High blood pressure (Hypertension) Mother Foy Elbe   . Ulcers Mother Foy Elbe   . Osteoarthritis Mother Foy Elbe   . Diabetes Father Bebe Elbe   . Thyroid  disease Father Bebe Elbe   . High blood pressure (Hypertension) Father Bebe Elbe   . Osteoarthritis Father Bebe Elbe   . Dementia Father Bebe Elbe   . Hyperlipidemia (Elevated cholesterol) Father Bebe Elbe   . Alzheimer's disease Father Bebe Elbe   . Obesity Brother    . High blood pressure (Hypertension) Brother    . Osteoarthritis Brother    .  Osteoarthritis Brother Kayla Elbe   . Diabetes Brother Kayla Elbe   . High blood pressure (Hypertension) Brother Kayla Elbe   . Diabetes Brother Nancyann Elbe   . Osteoarthritis Brother Nancyann Elbe   . Breast cancer Maternal Aunt    . Dementia Maternal Aunt Gwenn Bihari   . Diabetes Maternal Aunt Gwenn Bihari   . Alzheimer's disease Maternal Aunt Roise Segar   . Dementia Maternal Uncle Seena Elbe   . Dementia Maternal Uncle Willma Elbe   . Diabetes Maternal Uncle Willma Elbe   . Dementia Maternal Uncle Gaither Elbe   . Diabetes Maternal Uncle Gaither Elbe   . Dementia Maternal Uncle Craig Elbe   . Diabetes Maternal Uncle Craig Elbe   . Alzheimer's disease Maternal Uncle C A Terrell   . Alzheimer's disease Maternal Uncle Ellender Lay   . Alzheimer's disease Maternal Uncle Clem Lay   . Dementia Paternal Aunt Child psychotherapist   . Diabetes Paternal Barista   . Dementia Paternal Aunt Cleo Crenshaw   . Diabetes Paternal Aunt Cleo Crenshaw   . Alzheimer's disease Paternal Aunt Clara Manya   . Alzheimer's disease Paternal Uncle Gaither Elbe   . Alzheimer's disease Paternal Uncle Willma Elbe   . Aneurysm Son Onesty Clair        Brain Aneurysm     Social History: Social History   Socioeconomic History  . Marital status: Married    Spouse name: Monte  . Number of children: 2  . Years of education: 11  Occupational History  . Occupation: Retired - Daycare  Tobacco Use  . Smoking status: Former    Current packs/day: 0.00    Average packs/day: 1 pack/day for 15.0 years (15.0 ttl pk-yrs)    Types: Cigarettes    Start date: 08/16/1965    Quit date: 08/16/1980  Years since quitting: 43.7  . Smokeless tobacco: Never  Vaping Use  . Vaping status: Never Used  Substance and Sexual Activity  . Alcohol use: Not Currently    Alcohol/week: 0.0 standard drinks of alcohol    Comment: I have a drink maybe twice a year.  . Drug use: Never  . Sexual activity:  Defer    Partners: Male    Birth control/protection: Post-menopausal   Social Drivers of Health   Financial Resource Strain: Low Risk  (04/27/2024)   Overall Financial Resource Strain (CARDIA)   . Difficulty of Paying Living Expenses: Not hard at all  Food Insecurity: No Food Insecurity (04/27/2024)   Hunger Vital Sign   . Worried About Programme researcher, broadcasting/film/video in the Last Year: Never true   . Ran Out of Food in the Last Year: Never true  Transportation Needs: No Transportation Needs (04/27/2024)   PRAPARE - Transportation   . Lack of Transportation (Medical): No   . Lack of Transportation (Non-Medical): No     REVIEW OF SYSTEMS:   10 systems reviewed, unremarkable other than what is written above, with the exception of fatigue/malaise   PHYSICAL EXAM:  Vitals:   05/11/24 1219  BP: 116/67  Pulse: 86  Temp: 36.4 C (97.5 F)   Body mass index is 37.28 kg/m. Weight: (!) 101.6 kg (224 lb)   General Appearance:    Alert, cooperative, no distress, appears stated age Head:     Atraumatic, normocephalic Eyes:   Anciteric, conjunctiva pink. Mouth: no oral ulcers Lungs:     Respirations eupneic.  Clear to auscultation bilaterally  Heart:    Regular rate and rhythm, S1 and S2 normal, no murmur, rub   or gallop Abdomen:     Flat, bowel sounds x 4.Soft, non-tender/nondistended. No guarding, rigidity, rebound tenderness or other peritoneal signs Extremities:   No cyanosis or edema, moves all extremities well. Strength 5/5. Skin:   Skin color, texture, turgor normal, no rashes or lesions Neuro: alert, oriented x 3, cranial nerves II-XII intact Psych: pleasant, calm, cooperative, Logical thought and good insight.    REVIEW OF DATA: I have reviewed the following data today:  Prior notes labs endoscopy reports imaging    ASSESSMENT AND PLAN:   Acute diarrhea- to me it sounds like she initially had a viral gastroenteritis and now possibly has some flaring of her IBS - do note she has  not been taking her linzess  since this started. She has had a recent colonoscopy however may need to repeat this. Low residue diet. Ample hydration. Can try some pepto bismol tid prn- says immodium has not been helpful.  Daily psyllium. I do not think a choloretic issue would present like this. Fecal calprotectin and 2 view abdominal films today. Back to ed over the weekend if symptoms worsen, avoid dairy Follow up next week sooner if problems

## 2024-05-17 ENCOUNTER — Other Ambulatory Visit
Admission: RE | Admit: 2024-05-17 | Discharge: 2024-05-17 | Disposition: A | Discharge status: Home / Self Care | Source: Ambulatory Visit | Attending: Gastroenterology | Admitting: Gastroenterology

## 2024-05-17 ENCOUNTER — Other Ambulatory Visit: Payer: Self-pay | Admitting: Gastroenterology

## 2024-05-17 DIAGNOSIS — R300 Dysuria: Secondary | ICD-10-CM | POA: Diagnosis not present

## 2024-05-17 DIAGNOSIS — R197 Diarrhea, unspecified: Secondary | ICD-10-CM | POA: Diagnosis not present

## 2024-05-17 LAB — GASTROINTESTINAL PANEL BY PCR, STOOL (REPLACES STOOL CULTURE)

## 2024-05-17 LAB — C DIFFICILE QUICK SCREEN W PCR REFLEX
C Diff antigen: NEGATIVE
C Diff interpretation: NOT DETECTED
C Diff toxin: NEGATIVE

## 2024-05-18 ENCOUNTER — Other Ambulatory Visit: Payer: Self-pay | Admitting: Gastroenterology

## 2024-05-18 DIAGNOSIS — R197 Diarrhea, unspecified: Secondary | ICD-10-CM

## 2024-05-18 DIAGNOSIS — R1031 Right lower quadrant pain: Secondary | ICD-10-CM

## 2024-05-22 ENCOUNTER — Ambulatory Visit
Admission: RE | Admit: 2024-05-22 | Discharge: 2024-05-22 | Disposition: A | Discharge status: Home / Self Care | Source: Ambulatory Visit | Attending: Gastroenterology | Admitting: Gastroenterology

## 2024-05-22 DIAGNOSIS — R1032 Left lower quadrant pain: Secondary | ICD-10-CM | POA: Diagnosis not present

## 2024-05-22 DIAGNOSIS — R197 Diarrhea, unspecified: Secondary | ICD-10-CM | POA: Insufficient documentation

## 2024-05-22 DIAGNOSIS — R1031 Right lower quadrant pain: Secondary | ICD-10-CM | POA: Diagnosis not present

## 2024-05-22 DIAGNOSIS — N2889 Other specified disorders of kidney and ureter: Secondary | ICD-10-CM | POA: Diagnosis not present

## 2024-05-22 DIAGNOSIS — R109 Unspecified abdominal pain: Secondary | ICD-10-CM | POA: Diagnosis not present

## 2024-05-22 DIAGNOSIS — K5730 Diverticulosis of large intestine without perforation or abscess without bleeding: Secondary | ICD-10-CM | POA: Diagnosis not present

## 2024-05-22 NOTE — Progress Notes (Signed)
 Assessment & Plan:  1. SOB (shortness of breath) (Primary)   Patient Instructions  Call Dr. Layvonne office for appointment  We will do a respiratory viral panel  We will see you in follow-up in 4 to 6 weeks time.  Please note: late entry documentation due to logistical difficulties during COVID-19 pandemic. This note is filed for information purposes only, and is not intended to be used for billing, nor does it represent the full scope/nature of the visit in question. Please see any associated scanned media linked to date of encounter for additional pertinent information.  Subjective:    HPI: Nicole LIMBURG is a 73 y.o. female presenting to the pulmonology clinic on 11/02/2019 with report of: Follow-up (Patient feels a little better since last visit. States she feels like her breathing has got better but now has a heaviness in her chest and also has a dry cough. )     Outpatient Encounter Medications as of 11/02/2019  Medication Sig Note   RABEprazole (ACIPHEX) 20 MG tablet Take 20 mg by mouth 2 (two) times daily.    [DISCONTINUED] albuterol  (VENTOLIN  HFA) 108 (90 Base) MCG/ACT inhaler Inhale 2 puffs into the lungs every 6 (six) hours as needed for wheezing or shortness of breath.    [DISCONTINUED] aspirin  EC 325 MG tablet Take 325 mg by mouth daily.     [DISCONTINUED] azithromycin  (ZITHROMAX ) 250 MG tablet Take 2 tablets x 1 day and then one tablet per day for four more days.    [DISCONTINUED] budesonide -formoterol  (SYMBICORT ) 80-4.5 MCG/ACT inhaler Inhale 2 puffs into the lungs 2 (two) times daily.    [DISCONTINUED] Calcium  Carb-Cholecalciferol  (CALCIUM  500 +D) 500-400 MG-UNIT TABS Take 1 tablet by mouth daily. (Patient not taking: Reported on 06/05/2020)    [DISCONTINUED] Cholecalciferol  (VITAMIN D -3) 1000 units CAPS Take 1,000 Units by mouth daily.    [DISCONTINUED] fluticasone  (FLONASE ) 50 MCG/ACT nasal spray Place 2 sprays into both nostrils daily. (Patient not taking: Reported  on 09/23/2020)    [DISCONTINUED] gabapentin  (NEURONTIN ) 300 MG capsule Take 1 capsule (300 mg total) by mouth at bedtime as needed. (Patient not taking: Reported on 09/23/2020)    [DISCONTINUED] magnesium  oxide (MAG-OX) 400 MG tablet Take 400 mg daily by mouth. (Patient not taking: Reported on 06/05/2020)    [DISCONTINUED] metoprolol  succinate (TOPROL -XL) 100 MG 24 hr tablet Take 1 tablet (100 mg total) by mouth daily. Take with or immediately following a meal.    [DISCONTINUED] montelukast  (SINGULAIR ) 10 MG tablet TAKE 1 TABLET BY MOUTH AT  BEDTIME (Patient taking differently: Take 10 mg by mouth at bedtime.)    [DISCONTINUED] Multiple Vitamin (MULTIVITAMIN) tablet Take 1 tablet by mouth daily.    [DISCONTINUED] ondansetron  (ZOFRAN ) 4 MG tablet Take 1 tablet (4 mg total) by mouth 2 (two) times daily as needed for nausea or vomiting.    [DISCONTINUED] predniSONE  (DELTASONE ) 10 MG tablet Take 6 tablets x 1 day and then decrease by 1/2 tablet per day until down to zero mg.    [DISCONTINUED] rosuvastatin  (CRESTOR ) 10 MG tablet TAKE 1 TABLET BY MOUTH  DAILY 06/05/2020: Taking 3 days weekly   [DISCONTINUED] sucralfate  (CARAFATE ) 1 g tablet Take 1 g by mouth daily as needed (GI sympotms).  (Patient not taking: Reported on 08/23/2023)    [DISCONTINUED] venlafaxine  XR (EFFEXOR -XR) 150 MG 24 hr capsule TAKE 1 CAPSULE BY MOUTH  DAILY WITH BREAKFAST (Patient taking differently: Take 150 mg by mouth daily with breakfast. )    [DISCONTINUED] vitamin B-12 (  CYANOCOBALAMIN ) 1000 MCG tablet Take 1,000 mcg by mouth daily.    No facility-administered encounter medications on file as of 11/02/2019.      Objective:   Vitals:   11/02/19 1142  BP: 130/78  Pulse: 65  Temp: 97.7 F (36.5 C)  Height: 5' 5 (1.651 m)  Weight: 237 lb (107.5 kg)  SpO2: 98%  TempSrc: Temporal  BMI (Calculated): 39.44     Physical exam documentation is limited by delayed entry of information.

## 2024-05-24 ENCOUNTER — Other Ambulatory Visit: Payer: Self-pay | Admitting: Cardiovascular Disease

## 2024-05-25 MED FILL — Sodium Chloride IV Soln 0.9%: INTRAVENOUS | Qty: 500 | Status: AC

## 2024-05-28 ENCOUNTER — Other Ambulatory Visit: Payer: Self-pay | Admitting: Pharmacist

## 2024-05-28 DIAGNOSIS — E785 Hyperlipidemia, unspecified: Secondary | ICD-10-CM

## 2024-05-28 DIAGNOSIS — I700 Atherosclerosis of aorta: Secondary | ICD-10-CM

## 2024-05-28 DIAGNOSIS — R079 Chest pain, unspecified: Secondary | ICD-10-CM

## 2024-05-28 MED ORDER — REPATHA SURECLICK 140 MG/ML ~~LOC~~ SOAJ: 1.0000 mL | SUBCUTANEOUS | 1 refills | Status: DC

## 2024-05-30 ENCOUNTER — Encounter: Payer: Self-pay | Admitting: *Deleted

## 2024-06-02 ENCOUNTER — Other Ambulatory Visit: Payer: Self-pay | Admitting: Oncology

## 2024-06-04 ENCOUNTER — Encounter: Payer: Self-pay | Admitting: Oncology

## 2024-06-11 ENCOUNTER — Ambulatory Visit
Admission: RE | Admit: 2024-06-11 | Discharge: 2024-06-11 | Disposition: A | Discharge status: Home / Self Care | Attending: Gastroenterology | Admitting: Gastroenterology

## 2024-06-11 ENCOUNTER — Ambulatory Visit: Admitting: General Practice

## 2024-06-11 ENCOUNTER — Encounter
Admission: RE | Disposition: A | Discharge status: Home / Self Care | Payer: Self-pay | Source: Home / Self Care | Attending: Gastroenterology

## 2024-06-11 DIAGNOSIS — Z8673 Personal history of transient ischemic attack (TIA), and cerebral infarction without residual deficits: Secondary | ICD-10-CM | POA: Insufficient documentation

## 2024-06-11 DIAGNOSIS — J45909 Unspecified asthma, uncomplicated: Secondary | ICD-10-CM | POA: Insufficient documentation

## 2024-06-11 DIAGNOSIS — K641 Second degree hemorrhoids: Secondary | ICD-10-CM | POA: Diagnosis not present

## 2024-06-11 DIAGNOSIS — I5033 Acute on chronic diastolic (congestive) heart failure: Secondary | ICD-10-CM | POA: Diagnosis not present

## 2024-06-11 DIAGNOSIS — I110 Hypertensive heart disease with heart failure: Secondary | ICD-10-CM | POA: Insufficient documentation

## 2024-06-11 DIAGNOSIS — I5032 Chronic diastolic (congestive) heart failure: Secondary | ICD-10-CM | POA: Diagnosis not present

## 2024-06-11 DIAGNOSIS — K219 Gastro-esophageal reflux disease without esophagitis: Secondary | ICD-10-CM | POA: Diagnosis not present

## 2024-06-11 DIAGNOSIS — E669 Obesity, unspecified: Secondary | ICD-10-CM | POA: Insufficient documentation

## 2024-06-11 DIAGNOSIS — K649 Unspecified hemorrhoids: Secondary | ICD-10-CM | POA: Diagnosis not present

## 2024-06-11 DIAGNOSIS — Z87891 Personal history of nicotine dependence: Secondary | ICD-10-CM | POA: Diagnosis not present

## 2024-06-11 DIAGNOSIS — Z6836 Body mass index (BMI) 36.0-36.9, adult: Secondary | ICD-10-CM | POA: Diagnosis not present

## 2024-06-11 DIAGNOSIS — Z90710 Acquired absence of both cervix and uterus: Secondary | ICD-10-CM | POA: Insufficient documentation

## 2024-06-11 DIAGNOSIS — G4730 Sleep apnea, unspecified: Secondary | ICD-10-CM | POA: Insufficient documentation

## 2024-06-11 DIAGNOSIS — K582 Mixed irritable bowel syndrome: Secondary | ICD-10-CM | POA: Diagnosis not present

## 2024-06-11 DIAGNOSIS — R197 Diarrhea, unspecified: Secondary | ICD-10-CM | POA: Diagnosis present

## 2024-06-11 HISTORY — PX: COLONOSCOPY: SHX5424

## 2024-06-11 SURGERY — COLONOSCOPY
Anesthesia: General

## 2024-06-11 MED ORDER — PHENYLEPHRINE 80 MCG/ML (10ML) SYRINGE FOR IV PUSH (FOR BLOOD PRESSURE SUPPORT)
PREFILLED_SYRINGE | INTRAVENOUS | Status: AC
Start: 2024-06-11 — End: 2024-06-11
  Filled 2024-06-11: qty 10

## 2024-06-11 MED ORDER — SODIUM CHLORIDE 0.9 % IV SOLN
INTRAVENOUS | Status: DC
  Administered 2024-06-11: 20 mL/h via INTRAVENOUS

## 2024-06-11 MED ORDER — EPHEDRINE 5 MG/ML INJ
INTRAVENOUS | Status: AC
  Filled 2024-06-11: qty 5

## 2024-06-11 MED ORDER — LIDOCAINE HCL (PF) 2 % IJ SOLN
INTRAMUSCULAR | Status: AC
  Filled 2024-06-11: qty 5

## 2024-06-11 MED ORDER — PROPOFOL 500 MG/50ML IV EMUL
INTRAVENOUS | Status: DC | PRN
  Administered 2024-06-11: 50 mg via INTRAVENOUS
  Administered 2024-06-11: 125 ug/kg/min via INTRAVENOUS

## 2024-06-11 MED ORDER — EPHEDRINE SULFATE (PRESSORS) 25 MG/5ML IV SOSY
PREFILLED_SYRINGE | INTRAVENOUS | Status: DC | PRN
  Administered 2024-06-11: 10 mg via INTRAVENOUS

## 2024-06-11 MED ORDER — PHENYLEPHRINE 80 MCG/ML (10ML) SYRINGE FOR IV PUSH (FOR BLOOD PRESSURE SUPPORT)
PREFILLED_SYRINGE | INTRAVENOUS | Status: DC | PRN
  Administered 2024-06-11: 80 ug via INTRAVENOUS
  Administered 2024-06-11: 10 ug via INTRAVENOUS
  Administered 2024-06-11 (×2): 80 ug via INTRAVENOUS

## 2024-06-11 NOTE — Anesthesia Postprocedure Evaluation (Signed)
 Anesthesia Post Note  Patient: Nicole Sims  Procedure(s) Performed: COLONOSCOPY  Patient location during evaluation: Endoscopy Anesthesia Type: General Level of consciousness: awake and alert Pain management: pain level controlled Vital Signs Assessment: post-procedure vital signs reviewed and stable Respiratory status: spontaneous breathing, nonlabored ventilation, respiratory function stable and patient connected to nasal cannula oxygen Cardiovascular status: blood pressure returned to baseline and stable Postop Assessment: no apparent nausea or vomiting Anesthetic complications: no   There were no known notable events for this encounter.   Last Vitals:  Vitals:   06/11/24 1002 06/11/24 1012  BP: (!) 113/55 (!) 103/58  Pulse: 61 60  Resp: 16 13  Temp:    SpO2: 100% 100%    Last Pain:  Vitals:   06/11/24 1012  TempSrc:   PainSc: 0-No pain                 Debby Mines

## 2024-06-11 NOTE — Op Note (Signed)
 [facility] Gastroenterology Patient Name: Nicole Sims Procedure Date: 06/11/2024 8:33 AM MRN: [id number] Account #: [id number] Date of Birth: 1950/11/15 Admit Type: Outpatient Age: 73 Room: [facility] ENDO ROOM 3 Gender: Female Note Status: Finalized Instrument Name: Colon Scope [id number] Procedure:             Colonoscopy Indications:           Diarrhea Providers:             Ole Schick MD, MD Referring MD:          Allena Hamilton, MD (Referring MD) Medicines:             Monitored Anesthesia Care Complications:         No immediate complications. Estimated blood loss:                         Minimal. Procedure:             Pre-Anesthesia Assessment:                        - Prior to the procedure, a History and Physical was                         performed, and patient medications and allergies were                         reviewed. The patient is competent. The risks and                         benefits of the procedure and the sedation options and                         risks were discussed with the patient. All questions                         were answered and informed consent was obtained.                         Patient identification and proposed procedure were                         verified by the physician, the nurse, the                         anesthesiologist, the anesthetist and the technician                         in the endoscopy suite. Mental Status Examination:                         alert and oriented. Airway Examination: normal                         oropharyngeal airway and neck mobility. Respiratory                         Examination: clear to auscultation. CV Examination:  normal. Prophylactic Antibiotics: The patient does not                         require prophylactic antibiotics. Prior                         Anticoagulants: The patient has taken no anticoagulant                         or antiplatelet  agents. ASA Grade Assessment: III - A                         patient with severe systemic disease. After reviewing                         the risks and benefits, the patient was deemed in                         satisfactory condition to undergo the procedure. The                         anesthesia plan was to use monitored anesthesia care                         (MAC). Immediately prior to administration of                         medications, the patient was re-assessed for adequacy                         to receive sedatives. The heart rate, respiratory                         rate, oxygen saturations, blood pressure, adequacy of                         pulmonary ventilation, and response to care were                         monitored throughout the procedure. The physical                         status of the patient was re-assessed after the                         procedure.                        After obtaining informed consent, the colonoscope was                         passed under direct vision. Throughout the procedure,                         the patient's blood pressure, pulse, and oxygen                         saturations were monitored continuously. The  Colonoscope was introduced through the anus and                         advanced to the the terminal ileum, with                         identification of the appendiceal orifice and IC                         valve. The colonoscopy was performed without                         difficulty. The patient tolerated the procedure well.                         The quality of the bowel preparation was good. The                         terminal ileum, ileocecal valve, appendiceal orifice,                         and rectum were photographed. Findings:      The perianal and digital rectal examinations were normal.      The terminal ileum appeared normal.      Normal mucosa was found in the entire colon.  Biopsies for histology were       taken with a cold forceps from the entire colon for evaluation of       microscopic colitis. Estimated blood loss was minimal.      Internal hemorrhoids were found during retroflexion. The hemorrhoids       were Grade II (internal hemorrhoids that prolapse but reduce       spontaneously).      The exam was otherwise without abnormality on direct and retroflexion       views. Impression:            - The examined portion of the ileum was normal.                        - Normal mucosa in the entire examined colon. Biopsied.                        - Internal hemorrhoids.                        - The examination was otherwise normal on direct and                         retroflexion views. Recommendation:        - Discharge patient to home.                        - Resume previous diet.                        - Continue present medications.                        - Await pathology results.                        -  Repeat colonoscopy in 5 years for surveillance.                        - Return to referring physician as previously                         scheduled. Procedure Code(s):     --- Professional ---                        [phone], Colonoscopy, flexible; with biopsy, single or                         multiple Diagnosis Code(s):     --- Professional ---                        K64.1, Second degree hemorrhoids                        R19.7, Diarrhea, unspecified CPT copyright 2022 American [employer]. All rights reserved. The codes documented in this report are preliminary and upon coder review may  be revised to meet current compliance requirements. Ole Schick MD, MD 06/11/2024 9:48:40 AM Number of Addenda: 0 Note Initiated On: 06/11/2024 8:33 AM Scope Withdrawal Time: 0 hours 8 minutes 54 seconds  Total Procedure Duration: 0 hours 13 minutes 53 seconds  Estimated Blood Loss:  Estimated blood loss was minimal.      [facility]

## 2024-06-11 NOTE — Transfer of Care (Signed)
 Immediate Anesthesia Transfer of Care Note  Patient: Nicole Sims  Procedure(s) Performed: COLONOSCOPY  Patient Location: Endoscopy Unit  Anesthesia Type:General  Level of Consciousness: awake  Airway & Oxygen Therapy: Patient Spontanous Breathing  Post-op Assessment: Report given to RN and Post -op Vital signs reviewed and stable  Post vital signs: Reviewed and stable  Last Vitals:  Vitals Value Taken Time  BP 122/45 06/11/24 09:44  Temp 35.7 C 06/11/24 09:42  Pulse 71 06/11/24 09:45  Resp 13 06/11/24 09:45  SpO2 100 % 06/11/24 09:45  Vitals shown include unfiled device data.  Last Pain:  Vitals:   06/11/24 0942  TempSrc: Temporal  PainSc: Asleep         Complications: There were no known notable events for this encounter.

## 2024-06-11 NOTE — H&P (Signed)
 Outpatient short stay form Pre-procedure 06/11/2024  Nicole Sims Schick, MD  Primary Physician: Glendia Shad, MD  Reason for visit:  Diarrhea  History of present illness:    73 y/o lady with history of obesity, HFpEF, and IBS-C here for colonoscopy due to diarrhea. Last colonoscopy last year with fair prep but normal TI. No blood thinners except aspirin . No family history of GI malignancies. History of hysterectomy.    Current Facility-Administered Medications:    0.9 %  sodium chloride  infusion, , Intravenous, Continuous, [place]on Molock, Nicole ONEIDA, MD, Last Rate: 20 mL/hr at 06/11/24 0908, 20 mL/hr at 06/11/24 0908  Medications Prior to Admission  Medication Sig Dispense Refill Last Dose/Taking   acetaminophen  (TYLENOL ) 500 MG tablet Take 500 mg by mouth every 6 (six) hours as needed.   06/10/2024   albuterol  (VENTOLIN  HFA) 108 (90 Base) MCG/ACT inhaler Inhale 2 puffs into the lungs every 4 (four) hours as needed for wheezing or shortness of breath. 1 each 0 06/11/2024 at  6:10 AM   alendronate  (FOSAMAX ) 70 MG tablet Take 1 tablet (70 mg total) by mouth once a week. Take with a full glass of water on an empty stomach. 12 tablet 2 06/10/2024   anastrozole  (ARIMIDEX ) 1 MG tablet TAKE 1 TABLET(1 MG) BY MOUTH DAILY 90 tablet 3 06/10/2024   aspirin  325 MG tablet Take 325 mg by mouth daily.   Past Week   budesonide -formoterol  (SYMBICORT ) 80-4.5 MCG/ACT inhaler Inhale 2 puffs into the lungs 2 (two) times daily. 1 each 3 06/10/2024   CALCIUM  PO Take by mouth daily.   Past Week   clobetasol  cream (TEMOVATE ) 0.05 % Apply 1 Application topically 2 (two) times daily. 30 g 0 Past Week   Evolocumab  (REPATHA  SURECLICK) 140 MG/ML SOAJ Inject 140 mg into the skin every 14 (fourteen) days. 6 mL 1 06/10/2024   furosemide  (LASIX ) 20 MG tablet Take 1 tablet (20 mg) by mouth once daily as needed for weight gain of 3 lbs or more overnight   06/10/2024   MAGNESIUM  PO Take by mouth daily.   Past Week   metoprolol   succinate (TOPROL -XL) 100 MG 24 hr tablet TAKE 1 TABLET BY MOUTH DAILY 90 tablet 0 06/10/2024   Omega-3 Fatty Acids (FISH OIL) 1000 MG CAPS Take 1,000 mg by mouth.   Past Week   predniSONE  (DELTASONE ) 10 MG tablet Take 4 tablets x 1 day and then decrease by 1/2 tablet per day until down to zero mg. 18 tablet 0 06/10/2024   pregabalin (LYRICA) 50 MG capsule Take by mouth. Take 1 capsule by mouth in the morning, 1 capsule at afternoon, 2 capsules at bedtime.   06/10/2024   RABEprazole (ACIPHEX) 20 MG tablet Take 20 mg by mouth 2 (two) times daily.   06/10/2024   traMADol (ULTRAM) 50 MG tablet Take 1 tablet by mouth 2 (two) times daily as needed.   06/10/2024   venlafaxine  XR (EFFEXOR -XR) 150 MG 24 hr capsule TAKE 1 CAPSULE BY MOUTH DAILY  WITH BREAKFAST 90 capsule 3 06/10/2024   Cholecalciferol  (VITAMIN D3 PO) Take by mouth daily.      linaclotide  (LINZESS ) 290 MCG CAPS capsule Take 290 mcg by mouth daily before breakfast.      ondansetron  (ZOFRAN ) 4 MG tablet Take 1 tablet (4 mg total) by mouth 2 (two) times daily as needed for nausea or vomiting. 15 tablet 0      Allergies  Allergen Reactions   Levaquin  [Levofloxacin ]    Augmentin [Amoxicillin-Pot Clavulanate] Other (  See Comments)    Questionable itching   Bactrim [Sulfamethoxazole-Trimethoprim ] Rash   Doxycycline  Itching   Hydrocodone-Acetaminophen  Other (See Comments)    GI distress   Pseudoephedrine Rash    Itching of the scalp   Shellfish Allergy Nausea And Vomiting    Nausea and vomiting      Past Medical History:  Diagnosis Date   Anemia    Anginal pain    Anxiety    Asthma    Cataract cortical, senile    CHF (congestive heart failure) (HCC)    Chronic headaches    Diastolic heart failure (HCC)    Diverticulosis    Environmental allergies    GERD (gastroesophageal reflux disease)    Heart murmur    Hyperglycemia    Hyperlipidemia    Hypertension    Leukocytosis    Obesity    Osteoarthritis    Palpitations     Restless leg syndrome    Sleep apnea    Stroke (HCC)    Thrombocytosis    Urinary incontinence    mixed   Venous insufficiency    Vitamin D  deficiency    Wears dentures    partial upper    Review of systems:  Otherwise negative.    Physical Exam  Gen: Alert, oriented. Appears stated age.  HEENT: PERRLA. Lungs: No respiratory distress CV: RRR Abd: soft, benign, no masses Ext: No edema   Planned procedures: Proceed with colonoscopy. The patient understands the nature of the planned procedure, indications, risks, alternatives and potential complications including but not limited to bleeding, infection, perforation, damage to internal organs and possible oversedation/side effects from anesthesia. The patient agrees and gives consent to proceed.  Please refer to procedure notes for findings, recommendations and patient disposition/instructions.     Nicole Sims Schick, MD [facility] Gastroenterology

## 2024-06-11 NOTE — Anesthesia Preprocedure Evaluation (Signed)
 Anesthesia Evaluation  Patient identified by MRN, date of birth, ID band Patient awake    Reviewed: Allergy & Precautions, NPO status , Patient's Chart, lab work & pertinent test results  History of Anesthesia Complications Negative for: history of anesthetic complications  Airway Mallampati: III  TM Distance: <3 FB Neck ROM: full    Dental  (+) Chipped   Pulmonary neg shortness of breath, asthma , sleep apnea , former smoker   Pulmonary exam normal        Cardiovascular Exercise Tolerance: Good hypertension, (-) angina +CHF  Normal cardiovascular exam+ Valvular Problems/Murmurs      Neuro/Psych  Headaches PSYCHIATRIC DISORDERS       Neuromuscular disease CVA    GI/Hepatic Neg liver ROS,GERD  Controlled,,  Endo/Other  negative endocrine ROS    Renal/GU      Musculoskeletal   Abdominal   Peds  Hematology negative hematology ROS (+)   Anesthesia Other Findings Past Medical History: No date: Anemia No date: Anginal pain (HCC) No date: Anxiety No date: Asthma No date: Cataract cortical, senile No date: CHF (congestive heart failure) (HCC) No date: Chronic headaches No date: Diastolic heart failure (HCC) No date: Diverticulosis No date: Environmental allergies No date: GERD (gastroesophageal reflux disease) No date: Heart murmur No date: Hyperglycemia No date: Hyperlipidemia No date: Hypertension No date: Leukocytosis No date: Obesity No date: Osteoarthritis No date: Palpitations No date: Restless leg syndrome No date: Sleep apnea No date: Stroke [facility]) No date: Thrombocytosis No date: Urinary incontinence     Comment:  mixed No date: Venous insufficiency No date: Vitamin D  deficiency No date: Wears dentures     Comment:  partial upper  Past Surgical History: No date: BLADDER SURGERY     Comment:  x2   [place]  [facility] [place] 06/24/2022: BREAST BIOPSY; Left     Comment:  Stereo Bx, X-clip,  neg 06/24/2022: BREAST BIOPSY; Left     Comment:  Stereo Bx, ribbon clip, atypical lobular hyperplasis 06/24/2022: BREAST BIOPSY     Comment:  Stereo Bx, Coil Clip, neg 06/24/2022: BREAST BIOPSY; Left     Comment:  MM LT BREAST BX W LOC DEV 1ST LESION IMAGE BX SPEC               STEREO GUIDE 06/24/2022 [facility] 06/24/2022: BREAST BIOPSY; Left     Comment:  MM LT BREAST BX W LOC DEV EA AD LESION IMG BX SPEC               STEREO GUIDE 06/24/2022 [facility] 06/24/2022: BREAST BIOPSY; Left     Comment:  MM LT BREAST BX W LOC DEV EA AD LESION IMG BX SPEC               STEREO GUIDE 06/24/2022 [facility] 2005: BREAST CYST ASPIRATION; Bilateral     Comment:  approximate year No date: CARDIAC CATHETERIZATION     Comment:  [place] 04/24/2020: CATARACT EXTRACTION W/PHACO; Right     Comment:  Procedure: CATARACT EXTRACTION PHACO AND INTRAOCULAR               LENS PLACEMENT (IOC) RIGHT;  Surgeon: Jaye Fallow,               MD;  Location: [facility];  Service:               Ophthalmology;  Laterality: Right;  6.75 0:37.3 01/27/2021: CATARACT EXTRACTION W/PHACO; Left     Comment:  Procedure: CATARACT EXTRACTION PHACO AND INTRAOCULAR  LENS PLACEMENT (IOC) LEFT;  Surgeon: Jaye Fallow,               MD;  Location: [facility];  Service:               Ophthalmology;  Laterality: Left;  6.75 00:43.9 No date: CERVICAL CONE BIOPSY     Comment:  CIS No date: CHOLECYSTECTOMY 07/19/2016: COLONOSCOPY WITH PROPOFOL ; N/A     Comment:  Procedure: COLONOSCOPY WITH PROPOFOL ;  Surgeon: Lamar ONEIDA Holmes, MD;  Location: [facility] ENDOSCOPY;  Service:               Endoscopy;  Laterality: N/A; 10/20/2018: COLONOSCOPY WITH PROPOFOL ; N/A     Comment:  Procedure: COLONOSCOPY WITH PROPOFOL ;  Surgeon:               Gaylyn Gladis PENNER, MD;  Location: [facility] ENDOSCOPY;                Service: Endoscopy;  Laterality: N/A; 05/12/2021:  ESOPHAGOGASTRODUODENOSCOPY; N/A     Comment:  Procedure: ESOPHAGOGASTRODUODENOSCOPY (EGD);  Surgeon:               Maryruth Ole ONEIDA, MD;  Location[facility] END[facility];                Service: Endoscopy;  Laterality: N/A; 07/19/2016: ESOPHAGOGASTRODUODENOSCOPY (EGD) WITH PROPOFOL ; N/A     Comment:  Procedure: ESOPHAGOGASTRODUODENOSCOPY (EGD) WITH               PROPOFOL ;  Surgeon: Lamar ONEIDA Holmes, MD;  Location: [facility]              ENDOSCOPY;  Service: Endoscopy;  Laterality: N/A; No date: FLEXIBLE SIGMOIDOSCOPY 08/13/2008: KNEE ARTHROSCOPY No date: knee replacement and revision     Comment:  left No date: RECTOCELE REPAIR 04/10/2018: RIGHT/LEFT HEART CATH AND CORONARY ANGIOGRAPHY; Bilateral     Comment:  Procedure: RIGHT/LEFT HEART CATH AND CORONARY               ANGIOGRAPHY;  Surgeon: Darron Deatrice LABOR, MD;  Location:               [facility];  Service: Cardiovascular;                Laterality: Bilateral; No date: ROTATOR CUFF REPAIR     Comment:  bilateral 11/17/2005: SHOULDER SURGERY 1962: TONSILLECTOMY 1974: VAGINAL HYSTERECTOMY     Comment:  abnormal pap and carcinoma in situ     Reproductive/Obstetrics negative OB ROS                              Anesthesia Physical Anesthesia Plan  ASA: 3  Anesthesia Plan: General   Post-op Pain Management: Minimal or no pain anticipated   Induction: Intravenous  PONV Risk Score and Plan: 2 and Propofol  infusion and TIVA  Airway Management Planned: Nasal Cannula  Additional Equipment: None  Intra-op Plan:   Post-operative Plan:   Informed Consent: I have reviewed the patients History and Physical, chart, labs and discussed the procedure including the risks, benefits and alternatives for the proposed anesthesia with the patient or authorized representative who has indicated his/her understanding and acceptance.     Dental advisory given  Plan Discussed with: CRNA and Surgeon  Anesthesia  Plan Comments: (Discussed risks of anesthesia with patient, including possibility of difficulty with spontaneous  ventilation under anesthesia necessitating airway intervention, PONV, and rare risks such as cardiac or respiratory or neurological events, and allergic reactions. Discussed the role of CRNA in patient's perioperative care. Patient understands.)        Anesthesia Quick Evaluation

## 2024-06-11 NOTE — Interval H&P Note (Signed)
 History and Physical Interval Note:  06/11/2024 9:12 AM  Heron LABOR Adamski  has presented today for surgery, with the diagnosis of diarrhea.  The various methods of treatment have been discussed with the patient and family. After consideration of risks, benefits and other options for treatment, the patient has consented to  Procedure(s): COLONOSCOPY (N/A) as a surgical intervention.  The patient's history has been reviewed, patient examined, no change in status, stable for surgery.  I have reviewed the patient's chart and labs.  Questions were answered to the patient's satisfaction.     Ole ONEIDA Schick  Ok to proceed with colonoscopy

## 2024-06-12 LAB — SURGICAL PATHOLOGY

## 2024-06-21 ENCOUNTER — Encounter: Payer: Self-pay | Admitting: Internal Medicine

## 2024-06-21 ENCOUNTER — Ambulatory Visit: Admitting: Internal Medicine

## 2024-06-21 VITALS — BP 118/76 | HR 79 | Ht 65.0 in | Wt 213.6 lb

## 2024-06-21 DIAGNOSIS — E559 Vitamin D deficiency, unspecified: Secondary | ICD-10-CM

## 2024-06-21 DIAGNOSIS — R739 Hyperglycemia, unspecified: Secondary | ICD-10-CM

## 2024-06-21 DIAGNOSIS — N6099 Unspecified benign mammary dysplasia of unspecified breast: Secondary | ICD-10-CM

## 2024-06-21 DIAGNOSIS — I6509 Occlusion and stenosis of unspecified vertebral artery: Secondary | ICD-10-CM

## 2024-06-21 DIAGNOSIS — R197 Diarrhea, unspecified: Secondary | ICD-10-CM

## 2024-06-21 DIAGNOSIS — E7800 Pure hypercholesterolemia, unspecified: Secondary | ICD-10-CM

## 2024-06-21 DIAGNOSIS — R002 Palpitations: Secondary | ICD-10-CM | POA: Diagnosis not present

## 2024-06-21 DIAGNOSIS — D473 Essential (hemorrhagic) thrombocythemia: Secondary | ICD-10-CM

## 2024-06-21 DIAGNOSIS — I10 Essential (primary) hypertension: Secondary | ICD-10-CM

## 2024-06-21 DIAGNOSIS — K219 Gastro-esophageal reflux disease without esophagitis: Secondary | ICD-10-CM

## 2024-06-21 DIAGNOSIS — Z0000 Encounter for general adult medical examination without abnormal findings: Secondary | ICD-10-CM

## 2024-06-21 DIAGNOSIS — F439 Reaction to severe stress, unspecified: Secondary | ICD-10-CM

## 2024-06-21 DIAGNOSIS — I471 Supraventricular tachycardia, unspecified: Secondary | ICD-10-CM

## 2024-06-21 DIAGNOSIS — Z23 Encounter for immunization: Secondary | ICD-10-CM

## 2024-06-21 DIAGNOSIS — M810 Age-related osteoporosis without current pathological fracture: Secondary | ICD-10-CM

## 2024-06-21 DIAGNOSIS — T466X5A Adverse effect of antihyperlipidemic and antiarteriosclerotic drugs, initial encounter: Secondary | ICD-10-CM

## 2024-06-21 DIAGNOSIS — G720 Drug-induced myopathy: Secondary | ICD-10-CM

## 2024-06-21 DIAGNOSIS — I5030 Unspecified diastolic (congestive) heart failure: Secondary | ICD-10-CM

## 2024-06-21 DIAGNOSIS — D72829 Elevated white blood cell count, unspecified: Secondary | ICD-10-CM | POA: Diagnosis not present

## 2024-06-21 DIAGNOSIS — D509 Iron deficiency anemia, unspecified: Secondary | ICD-10-CM

## 2024-06-21 LAB — CBC WITH DIFFERENTIAL/PLATELET
Basophils Absolute: 0.1 K/uL (ref 0.0–0.1)
Basophils Relative: 0.9 % (ref 0.0–3.0)
Eosinophils Absolute: 0.1 K/uL (ref 0.0–0.7)
Eosinophils Relative: 1.5 % (ref 0.0–5.0)
HCT: 37.7 % (ref 36.0–46.0)
Hemoglobin: 12.3 g/dL (ref 12.0–15.0)
Lymphocytes Relative: 35.1 % (ref 12.0–46.0)
Lymphs Abs: 2.8 K/uL (ref 0.7–4.0)
MCHC: 32.7 g/dL (ref 30.0–36.0)
MCV: 86 fl (ref 78.0–100.0)
Monocytes Absolute: 0.6 K/uL (ref 0.1–1.0)
Monocytes Relative: 7.6 % (ref 3.0–12.0)
Neutro Abs: 4.4 K/uL (ref 1.4–7.7)
Neutrophils Relative %: 54.9 % (ref 43.0–77.0)
Platelets: 429 K/uL — ABNORMAL HIGH (ref 150.0–400.0)
RBC: 4.38 Mil/uL (ref 3.87–5.11)
RDW: 15.1 % (ref 11.5–15.5)
WBC: 8 K/uL (ref 4.0–10.5)

## 2024-06-21 LAB — VITAMIN D 25 HYDROXY (VIT D DEFICIENCY, FRACTURES): VITD: 48.8 ng/mL (ref 30.00–100.00)

## 2024-06-21 LAB — MAGNESIUM: Magnesium: 2 mg/dL (ref 1.5–2.5)

## 2024-06-21 NOTE — Assessment & Plan Note (Signed)
 Physical today 06/21/24  Mammogram 12/28/23 - Birads III. Recommended f/u diagnostic mammogram 1 year. Colonoscopy 06/11/24.  Recommended f/u in 5 years. SABRA Peacock f/u with Dr Lovetta.

## 2024-06-21 NOTE — Progress Notes (Signed)
 Subjective:    Patient ID: Nicole Sims, female    DOB: 13-Aug-1951, 74 y.o.   MRN: [id number]  Patient here for  Chief Complaint  Patient presents with   Annual Exam    Physical     HPI Here for a physical exam. S/p colonoscopy 06/11/24 - internal hemorrhoids. Recommended f/u in 5 years. Saw GI 05/11/24 - acute diarrhea. Felt likely viral gastroenteritis and possible flare of IBS. Recommended pepto bismol. Daily psyllium. Calprotectin elevated. Stool is better. Not as watery. Still may have 2-3 stools per day. Had f/u with cardiology 03/26/24 - f/u sob. Recommended continue lasix  prn. ECHO - scheduled for 06/27/24. Continue toprol  - take in pm. On anastrozole . Saw Dr Jacobo 11/01/23 - stable. Continue anastrozole  - with plans to complete  treatment in 07/2027. F/u with Dr Cesar 01/05/24 - stable. Recommended bilateral diagnostic mammogram in one year.  Seeing Dr Clois - last 08/23/23 - f/u peripheral neuropathy with neuropathic pain - moderate stenosis L4-5. He discussed treatment options - spinal cord stimulator vs L4-5 decompression.  had f/u with physiatry 02/02/24 - does not want to have surgery and insurance does not cover spinal cord stimulator. Recommended continuing tylenol  and lyrica.    Past Medical History:  Diagnosis Date   Allergy ?   Anemia    Anginal pain    Anxiety    Asthma    Cataract cortical, senile    CHF (congestive heart failure) (HCC)    Chronic headaches    Chronic kidney disease    Diastolic heart failure (HCC)    Diverticulosis    Environmental allergies    GERD (gastroesophageal reflux disease)    Heart murmur    Hyperglycemia    Hyperlipidemia    Hypertension    Leukocytosis    Neuromuscular disorder (HCC) [id number]   Obesity    Osteoarthritis    Osteoporosis 2024   Palpitations    Restless leg syndrome    Sleep apnea    Stroke (HCC)    Thrombocytosis    Urinary incontinence    mixed   Venous insufficiency    Vitamin D  deficiency     Wears dentures    partial upper   Past Surgical History:  Procedure Laterality Date   BLADDER SURGERY     x2   [place]  [facility]   BREAST BIOPSY Left 06/24/2022   Stereo Bx, X-clip, neg   BREAST BIOPSY Left 06/24/2022   Stereo Bx, ribbon clip, atypical lobular hyperplasis   BREAST BIOPSY  06/24/2022   Stereo Bx, Coil Clip, neg   BREAST BIOPSY Left 06/24/2022   MM LT BREAST BX W LOC DEV 1ST LESION IMAGE BX SPEC STEREO GUIDE 06/24/2022 [facility]   BREAST BIOPSY Left 06/24/2022   MM LT BREAST BX W LOC DEV EA AD LESION IMG BX SPEC STEREO GUIDE 06/24/2022 [facility]   BREAST BIOPSY Left 06/24/2022   MM LT BREAST BX W LOC DEV EA AD LESION IMG BX SPEC STEREO GUIDE 06/24/2022 [facility]   BREAST CYST ASPIRATION Bilateral 2005   approximate year   CARDIAC CATHETERIZATION     [place]   CATARACT EXTRACTION W/PHACO Right 04/24/2020   Procedure: CATARACT EXTRACTION PHACO AND INTRAOCULAR LENS PLACEMENT (IOC) RIGHT;  Surgeon: Jaye Fallow, MD;  Location: [facility];  Service: Ophthalmology;  Laterality: Right;  6.75 0:37.3   CATARACT EXTRACTION W/PHACO Left 01/27/2021   Procedure: CATARACT EXTRACTION PHACO AND INTRAOCULAR LENS PLACEMENT (IOC) LEFT;  Surgeon: Jaye Fallow, MD;  Location: [facility];  Service: Ophthalmology;  Laterality: Left;  6.75 00:43.9   CERVICAL CONE BIOPSY     CIS   CHOLECYSTECTOMY     COLON SURGERY  2025   Colonospy and biopsy   COLONOSCOPY N/A 06/11/2024   Procedure: COLONOSCOPY;  Surgeon: Maryruth Ole DASEN, MD;  Location: [facility];  Service: Endoscopy;  Laterality: N/A;   COLONOSCOPY WITH PROPOFOL  N/A 07/19/2016   Procedure: COLONOSCOPY WITH PROPOFOL ;  Surgeon: Lamar DASEN Holmes, MD;  Location: [facility];  Service: Endoscopy;  Laterality: N/A;   COLONOSCOPY WITH PROPOFOL  N/A 10/20/2018   Procedure: COLONOSCOPY WITH PROPOFOL ;  Surgeon: Gaylyn Gladis PENNER, MD;  Location: [facility]COPY;  Service: Endoscopy;   Laterality: N/A;   COLONOSCOPY WITH PROPOFOL  N/A 01/11/2023   Procedure: COLONOSCOPY WITH PROPOFOL ;  Surgeon: Maryruth Ole DASEN, MD;  Location: [facility];  Service: Endoscopy;  Laterality: N/A;   ESOPHAGOGASTRODUODENOSCOPY N/A 05/12/2021   Procedure: ESOPHAGOGASTRODUODENOSCOPY (EGD);  Surgeon: Maryruth Ole DASEN, MD;  Location: [facility] ENDOSCOPY;  Service: Endoscopy;  Laterality: N/A;   ESOPHAGOGASTRODUODENOSCOPY (EGD) WITH PROPOFOL  N/A 07/19/2016   Procedure: ESOPHAGOGASTRODUODENOSCOPY (EGD) WITH PROPOFOL ;  Surgeon: Lamar DASEN Holmes, MD;  Location: [facility] ENDOSCOPY;  Service: Endoscopy;  Laterality: N/A;   EYE SURGERY  Cataract surgery both eyes   FLEXIBLE SIGMOIDOSCOPY     JOINT REPLACEMENT  2009 left knee   2014 right knee   KNEE ARTHROSCOPY  08/13/2008   knee replacement and revision     left   RECTOCELE REPAIR     RIGHT/LEFT HEART CATH AND CORONARY ANGIOGRAPHY Bilateral 04/10/2018   Procedure: RIGHT/LEFT HEART CATH AND CORONARY ANGIOGRAPHY;  Surgeon: Darron Deatrice LABOR, MD;  Location: [facility];  Service: Cardiovascular;  Laterality: Bilateral;   ROTATOR CUFF REPAIR     bilateral   SHOULDER SURGERY  11/17/2005   TONSILLECTOMY  1962   TRIGGER FINGER RELEASE Left 12/2023   VAGINAL HYSTERECTOMY  1974   abnormal pap and carcinoma in situ   Family History  Problem Relation Age of Onset   Diabetes Mellitus II Father    Thyroid  disease Father    Alzheimer's disease Father    Arthritis Father    Diabetes Father    Hypertension Father    Obesity Father    Breast cancer Maternal Aunt    Hypertension Mother    Anxiety disorder Mother    Arthritis Mother    Depression Mother    Varicose Veins Mother    Heart Problems Brother    Leukemia Maternal Aunt    Alzheimer's disease Paternal Aunt    Alzheimer's disease Paternal Uncle    Alzheimer's disease Paternal Uncle    Alzheimer's disease Paternal Uncle    Alzheimer's disease Paternal Aunt    Arthritis Brother     Diabetes Brother    Hypertension Brother    Kidney disease Brother    Obesity Brother    Diabetes Brother    Colon cancer Neg Hx    Kidney cancer Neg Hx    Bladder Cancer Neg Hx    Social History   Socioeconomic History   Marital status: Married    Spouse name: Not on file   Number of children: Not on file   Years of education: Not on file   Highest education level: 12th grade  Occupational History   Occupation: retired  Tobacco Use   Smoking status: Former    Current packs/day: 0.00    Average packs/day: 1.1 packs/day for 20.0 years (22.5 ttl pk-yrs)  Types: Cigarettes    Start date: 08/16/1968    Quit date: 08/17/1983    Years since quitting: 40.9   Smokeless tobacco: Never  Vaping Use   Vaping status: Never Used  Substance and Sexual Activity   Alcohol use: Not Currently    Comment: rarely   Drug use: No   Sexual activity: Not Currently    Birth control/protection: None  Other Topics Concern   Not on file  Social History Narrative   Lives at home with husband   Social Drivers of Corporate Investment Banker Strain: Low Risk  (06/15/2024)   Overall Financial Resource Strain (CARDIA)    Difficulty of Paying Living Expenses: Not very hard  Food Insecurity: No Food Insecurity (06/15/2024)   Hunger Vital Sign    Worried About Running Out of Food in the Last Year: Never true    Ran Out of Food in the Last Year: Never true  Transportation Needs: No Transportation Needs (06/15/2024)   PRAPARE - Administrator, Civil Service (Medical): No    Lack of Transportation (Non-Medical): No  Physical Activity: Inactive (06/15/2024)   Exercise Vital Sign    Days of Exercise per Week: 0 days    Minutes of Exercise per Session: Not on file  Stress: No Stress Concern Present (06/15/2024)   [employer] of Occupational Health - Occupational Stress Questionnaire    Feeling of Stress: Not at all  Social Connections: Moderately Integrated (06/15/2024)   Social  Connection and Isolation Panel    Frequency of Communication with Friends and Family: More than three times a week    Frequency of Social Gatherings with Friends and Family: Once a week    Attends Religious Services: More than 4 times per year    Active Member of [employer] or Organizations: No    Attends Engineer, Structural: Not on file    Marital Status: Married     Review of Systems  Constitutional:  Negative for appetite change and unexpected weight change.  HENT:  Negative for congestion, sinus pressure and sore throat.   Eyes:  Negative for pain and visual disturbance.  Respiratory:  Negative for cough, chest tightness and shortness of breath.   Cardiovascular:  Negative for chest pain, palpitations and leg swelling.  Gastrointestinal:  Positive for diarrhea and nausea. Negative for vomiting.  Genitourinary:  Negative for difficulty urinating and dysuria.  Musculoskeletal:  Positive for back pain. Negative for joint swelling.  Skin:  Negative for color change and rash.  Neurological:  Negative for dizziness.       History of headaches. Due to see neurology today.   Hematological:  Negative for adenopathy. Does not bruise/bleed easily.  Psychiatric/Behavioral:  Negative for dysphoric mood.        Increased stress. Discussed.        Objective:     BP 118/76   Pulse 79   Ht 5' 5 (1.651 m)   Wt 213 lb 9.6 oz (96.9 kg)   SpO2 97%   BMI 35.54 kg/m  Wt Readings from Last 3 Encounters:  06/21/24 213 lb 9.6 oz (96.9 kg)  06/11/24 216 lb 6.4 oz (98.2 kg)  05/09/24 221 lb (100.2 kg)    Physical Exam Vitals reviewed.  Constitutional:      General: She is not in acute distress.    Appearance: Normal appearance.  HENT:     Head: Normocephalic and atraumatic.     Right Ear: External ear normal.  Left Ear: External ear normal.     Mouth/Throat:     Pharynx: No oropharyngeal exudate or posterior oropharyngeal erythema.  Eyes:     General: No scleral icterus.        Right eye: No discharge.        Left eye: No discharge.     Conjunctiva/sclera: Conjunctivae normal.  Neck:     Thyroid : No thyromegaly.  Cardiovascular:     Rate and Rhythm: Normal rate and regular rhythm.  Pulmonary:     Effort: No respiratory distress.     Breath sounds: Normal breath sounds. No wheezing.  Abdominal:     General: Bowel sounds are normal.     Palpations: Abdomen is soft.     Tenderness: There is no abdominal tenderness.  Musculoskeletal:        General: No swelling or tenderness.     Cervical back: Neck supple. No tenderness.  Lymphadenopathy:     Cervical: No cervical adenopathy.  Skin:    Findings: No erythema or rash.  Neurological:     Mental Status: She is alert.  Psychiatric:        Mood and Affect: Mood normal.        Behavior: Behavior normal.         Outpatient Encounter Medications as of 06/21/2024  Medication Sig   acetaminophen  (TYLENOL ) 500 MG tablet Take 500 mg by mouth every 6 (six) hours as needed.   albuterol  (VENTOLIN  HFA) 108 (90 Base) MCG/ACT inhaler Inhale 2 puffs into the lungs every 4 (four) hours as needed for wheezing or shortness of breath.   anastrozole  (ARIMIDEX ) 1 MG tablet TAKE 1 TABLET(1 MG) BY MOUTH DAILY   aspirin  325 MG tablet Take 325 mg by mouth daily.   budesonide -formoterol  (SYMBICORT ) 80-4.5 MCG/ACT inhaler Inhale 2 puffs into the lungs 2 (two) times daily.   CALCIUM  PO Take by mouth daily.   Cholecalciferol  (VITAMIN D3 PO) Take by mouth daily.   clobetasol  cream (TEMOVATE ) 0.05 % Apply 1 Application topically 2 (two) times daily.   Evolocumab  (REPATHA  SURECLICK) 140 MG/ML SOAJ Inject 140 mg into the skin every 14 (fourteen) days.   furosemide  (LASIX ) 20 MG tablet Take 1 tablet (20 mg) by mouth once daily as needed for weight gain of 3 lbs or more overnight   linaclotide  (LINZESS ) 290 MCG CAPS capsule Take 290 mcg by mouth daily before breakfast.   MAGNESIUM  PO Take by mouth daily.   metoprolol  succinate  (TOPROL -XL) 100 MG 24 hr tablet TAKE 1 TABLET BY MOUTH DAILY   Omega-3 Fatty Acids (FISH OIL) 1000 MG CAPS Take 1,000 mg by mouth.   ondansetron  (ZOFRAN ) 4 MG tablet Take 1 tablet (4 mg total) by mouth 2 (two) times daily as needed for nausea or vomiting.   pregabalin (LYRICA) 50 MG capsule Take by mouth. Take 1 capsule by mouth in the morning, 1 capsule at afternoon, 2 capsules at bedtime.   RABEprazole (ACIPHEX) 20 MG tablet Take 20 mg by mouth 2 (two) times daily.   traMADol (ULTRAM) 50 MG tablet Take 1 tablet by mouth 2 (two) times daily as needed.   venlafaxine  XR (EFFEXOR -XR) 150 MG 24 hr capsule TAKE 1 CAPSULE BY MOUTH DAILY  WITH BREAKFAST   [DISCONTINUED] alendronate  (FOSAMAX ) 70 MG tablet Take 1 tablet (70 mg total) by mouth once a week. Take with a full glass of water on an empty stomach.   [DISCONTINUED] predniSONE  (DELTASONE ) 10 MG tablet Take 4 tablets x 1  day and then decrease by 1/2 tablet per day until down to zero mg.   No facility-administered encounter medications on file as of 06/21/2024.     Lab Results  Component Value Date   WBC 8.0 06/21/2024   HGB 12.3 06/21/2024   HCT 37.7 06/21/2024   PLT 429.0 (H) 06/21/2024   GLUCOSE 87 06/22/2024   CHOL 144 06/22/2024   TRIG 135.0 06/22/2024   HDL 50.40 06/22/2024   LDLDIRECT 172.8 09/17/2013   LDLCALC 66 06/22/2024   ALT 13 06/22/2024   AST 16 06/22/2024   NA 143 06/22/2024   K 4.4 06/22/2024   CL 104 06/22/2024   CREATININE 1.08 06/22/2024   BUN 15 06/22/2024   CO2 23 06/22/2024   TSH 1.491 05/09/2024   INR 1.0 06/11/2019   HGBA1C 5.9 06/22/2024       Assessment & Plan:  Essential hypertension Assessment & Plan: Continue toprol . Blood pressure as outlined. No change in medication today. Follow.    Hypercholesteremia Assessment & Plan: Continue repatha . Low cholesterol diet and exercise. Follow lipid panel.   Orders: -     Basic metabolic panel with GFR; Future -     Hepatic function panel; Future -      Lipid panel; Future  Hyperglycemia -     Hemoglobin A1c; Future  Leukocytosis, unspecified type Assessment & Plan: Follow cbc.   Orders: -     CBC with Differential/Platelet  Health care maintenance Assessment & Plan: Physical today 06/21/24  Mammogram 12/28/23 - Birads III. Recommended f/u diagnostic mammogram 1 year. Colonoscopy 06/11/24.  Recommended f/u in 5 years. SABRA Peacock f/u with Dr Lovetta.    Iron  deficiency anemia, unspecified iron  deficiency anemia type Assessment & Plan: Follow cbc and iron  studies.    Vitamin D  deficiency Assessment & Plan: Follow vitamin D  level.   Orders: -     VITAMIN D  25 Hydroxy (Vit-D Deficiency, Fractures)  Palpitations -     Magnesium   Flu vaccine need -     Flu vaccine HIGH DOSE PF(Fluzone Trivalent)  Stress Assessment & Plan: Increased stress. Discussed. Follow.    Stenosis of vertebral artery without cerebral infarction, unspecified laterality Assessment & Plan: Continue aspirin  and repatha .    Statin myopathy Assessment & Plan: Previous joint aching.  Unable to take statin medication.  Continue repatha . Tolerating.    PSVT (paroxysmal supraventricular tachycardia) Assessment & Plan: Has a history of PSVT.  Has seen Dr. Ewing.  Continue metoprolol .  Denies increased heart rate or palpitations. No change in medication today. Follow.    Osteoporosis without current pathological fracture, unspecified osteoporosis type Assessment & Plan: Dr Jacobo 10/2023 - start fosamax . F/u bone density one year.    Gastroesophageal reflux disease without esophagitis Assessment & Plan: Continue aciphex. No upper symptoms reported.    Essential (hemorrhagic) thrombocythemia (HCC) Assessment & Plan: Has been worked up by hematology.  Follow cbc.    Diastolic heart failure, unspecified HF chronicity (HCC) Assessment & Plan: History of diastolic dysfunction.  EF 60-65%.  Continue metoprolol . No evidence of volume  overload on exam. Breathing stable.    Diarrhea, unspecified type Assessment & Plan: Saw GI 05/11/24 - acute diarrhea. Felt likely viral gastroenteritis and possible flare of IBS. Recommended pepto bismol. Daily psyllium. Calprotectin elevated. Stool is better. Not as watery. Still may have 2-3 stools per day. Plan f/u with GI.    Atypical hyperplasia of breast Assessment & Plan:  Had f/u with Dr Selinda. - f/u atypical lobular  hyperplasia - both breasts. Last evlauated 07/05/23. Recommended f/u mammo in 6 months and to continue f/u with oncology. On anastrozole . Saw Dr Jacobo 11/01/23 - stable. Continue anastrozole  - with plans to complete  treatment in 07/2027. F/u with Dr Cesar 01/05/24 - stable. Recommended bilateral diagnostic mammogram in one year.       Allena Hamilton, MD

## 2024-06-22 ENCOUNTER — Ambulatory Visit (INDEPENDENT_AMBULATORY_CARE_PROVIDER_SITE_OTHER)

## 2024-06-22 ENCOUNTER — Ambulatory Visit: Payer: Self-pay | Admitting: Internal Medicine

## 2024-06-22 ENCOUNTER — Telehealth: Payer: Self-pay | Admitting: Internal Medicine

## 2024-06-22 DIAGNOSIS — E7800 Pure hypercholesterolemia, unspecified: Secondary | ICD-10-CM

## 2024-06-22 DIAGNOSIS — R739 Hyperglycemia, unspecified: Secondary | ICD-10-CM | POA: Diagnosis not present

## 2024-06-22 LAB — LIPID PANEL
Cholesterol: 144 mg/dL (ref 0–200)
HDL: 50.4 mg/dL (ref >39.00)
LDL Cholesterol: 66 mg/dL (ref 0–99)
NonHDL: 93.35
Total CHOL/HDL Ratio: 3
Triglycerides: 135 mg/dL (ref 0.0–149.0)
VLDL: 27 mg/dL (ref 0.0–40.0)

## 2024-06-22 LAB — BASIC METABOLIC PANEL WITH GFR
BUN: 15 mg/dL (ref 6–23)
CO2: 23 meq/L (ref 19–32)
Calcium: 9.3 mg/dL (ref 8.4–10.5)
Chloride: 104 meq/L (ref 96–112)
Creatinine, Ser: 1.08 mg/dL (ref 0.40–1.20)
GFR: 50.72 mL/min — ABNORMAL LOW (ref >60.00)
Glucose, Bld: 87 mg/dL (ref 70–99)
Potassium: 4.4 meq/L (ref 3.5–5.1)
Sodium: 143 meq/L (ref 135–145)

## 2024-06-22 LAB — HEPATIC FUNCTION PANEL
ALT: 13 U/L (ref 0–35)
AST: 16 U/L (ref 0–37)
Albumin: 4.2 g/dL (ref 3.5–5.2)
Alkaline Phosphatase: 82 U/L (ref 39–117)
Bilirubin, Direct: 0.1 mg/dL (ref 0.0–0.3)
Total Bilirubin: 0.5 mg/dL (ref 0.2–1.2)
Total Protein: 7.3 g/dL (ref 6.0–8.3)

## 2024-06-22 LAB — HEMOGLOBIN A1C: Hgb A1c MFr Bld: 5.9 % (ref 4.6–6.5)

## 2024-06-22 NOTE — Telephone Encounter (Signed)
 Please add met b, A1c, lipid panel and liver panel to blood drawn yesterday. Thanks.

## 2024-06-26 ENCOUNTER — Other Ambulatory Visit: Payer: Self-pay | Admitting: Oncology

## 2024-06-27 ENCOUNTER — Ambulatory Visit: Attending: Medical

## 2024-06-27 DIAGNOSIS — R0602 Shortness of breath: Secondary | ICD-10-CM

## 2024-06-28 LAB — ECHOCARDIOGRAM COMPLETE
AR max vel: 2.39 cm2
AV Area VTI: 2.47 cm2
AV Area mean vel: 2.46 cm2
AV Mean grad: 5 mmHg
AV Peak grad: 9.6 mmHg
Ao pk vel: 1.55 m/s
Area-P 1/2: 3.6 cm2
S' Lateral: 2.8 cm

## 2024-07-01 ENCOUNTER — Encounter: Payer: Self-pay | Admitting: Internal Medicine

## 2024-07-01 NOTE — Assessment & Plan Note (Signed)
 Has a history of PSVT.  Has seen Dr. Ewing.  Continue metoprolol .  Denies increased heart rate or palpitations. No change in medication today. Follow.

## 2024-07-01 NOTE — Assessment & Plan Note (Signed)
 Follow cbc and iron studies.

## 2024-07-01 NOTE — Assessment & Plan Note (Signed)
Continue aciphex. No upper symptoms reported.

## 2024-07-01 NOTE — Assessment & Plan Note (Signed)
 Had f/u with Dr Selinda. - f/u atypical lobular hyperplasia - both breasts. Last evlauated 07/05/23. Recommended f/u mammo in 6 months and to continue f/u with oncology. On anastrozole . Saw Dr Jacobo 11/01/23 - stable. Continue anastrozole  - with plans to complete  treatment in 07/2027. F/u with Dr Cesar 01/05/24 - stable. Recommended bilateral diagnostic mammogram in one year.

## 2024-07-01 NOTE — Assessment & Plan Note (Signed)
Has been worked up by hematology.  Follow cbc.  

## 2024-07-01 NOTE — Assessment & Plan Note (Signed)
Continue aspirin and repatha.  

## 2024-07-01 NOTE — Assessment & Plan Note (Signed)
 Previous joint aching.  Unable to take statin medication.  Continue repatha . Tolerating.

## 2024-07-01 NOTE — Assessment & Plan Note (Signed)
 Follow cbc.

## 2024-07-01 NOTE — Assessment & Plan Note (Signed)
Continue repatha.  Low cholesterol diet and exercise.  Follow lipid panel.  

## 2024-07-01 NOTE — Assessment & Plan Note (Signed)
Follow vitamin D level.  

## 2024-07-01 NOTE — Assessment & Plan Note (Signed)
 History of diastolic dysfunction.  EF 60-65%.  Continue metoprolol . No evidence of volume overload on exam. Breathing stable.

## 2024-07-01 NOTE — Assessment & Plan Note (Signed)
 Saw GI 05/11/24 - acute diarrhea. Felt likely viral gastroenteritis and possible flare of IBS. Recommended pepto bismol. Daily psyllium. Calprotectin elevated. Stool is better. Not as watery. Still may have 2-3 stools per day. Plan f/u with GI.

## 2024-07-01 NOTE — Assessment & Plan Note (Signed)
Increased stress.  Discussed.  Follow.  

## 2024-07-01 NOTE — Assessment & Plan Note (Signed)
 Continue toprol . Blood pressure as outlined. No change in medication today. Follow.

## 2024-07-01 NOTE — Assessment & Plan Note (Signed)
 Dr Jacobo 10/2023 - start fosamax . F/u bone density one year.

## 2024-07-02 ENCOUNTER — Ambulatory Visit: Payer: Self-pay | Admitting: Medical

## 2024-07-16 ENCOUNTER — Other Ambulatory Visit

## 2024-07-18 ENCOUNTER — Other Ambulatory Visit: Payer: Self-pay | Admitting: Medical

## 2024-08-01 ENCOUNTER — Other Ambulatory Visit: Payer: Self-pay | Admitting: Internal Medicine

## 2024-08-16 ENCOUNTER — Other Ambulatory Visit: Payer: Self-pay | Admitting: Cardiovascular Disease

## 2024-08-16 DIAGNOSIS — I700 Atherosclerosis of aorta: Secondary | ICD-10-CM

## 2024-08-16 DIAGNOSIS — E785 Hyperlipidemia, unspecified: Secondary | ICD-10-CM

## 2024-08-16 DIAGNOSIS — R079 Chest pain, unspecified: Secondary | ICD-10-CM

## 2024-08-29 ENCOUNTER — Encounter: Payer: Self-pay | Admitting: Oncology

## 2024-08-29 ENCOUNTER — Telehealth: Payer: Self-pay | Admitting: Pharmacy Technician

## 2024-08-29 ENCOUNTER — Other Ambulatory Visit (HOSPITAL_COMMUNITY): Payer: Self-pay

## 2024-08-29 NOTE — Telephone Encounter (Signed)
 Patient Advocate Encounter   The patient was approved for a [employer] grant that will help cover the cost of REPATHA  Total amount awarded, 2500.  Effective: 07/30/24 - 07/29/25   APW:[id number] ERW:[id number] Hmnle:[id number] PI:[id number] [employer] ID: [id number]   Pharmacy provided with approval and processing information. Patient informed via [facility]

## 2024-09-25 ENCOUNTER — Ambulatory Visit: Admitting: Internal Medicine

## 2024-10-31 ENCOUNTER — Ambulatory Visit: Admitting: Oncology

## 2025-01-30 ENCOUNTER — Ambulatory Visit
